# Patient Record
Sex: Female | Born: 1935 | Race: White | Hispanic: No | Smoking: Former smoker
Health system: Southern US, Community
[De-identification: ages and names within clinical notes are randomized; demographics above are authoritative.]

## PROBLEM LIST (undated history)

## (undated) DIAGNOSIS — R011 Cardiac murmur, unspecified: Secondary | ICD-10-CM

## (undated) DIAGNOSIS — E669 Obesity, unspecified: Secondary | ICD-10-CM

## (undated) DIAGNOSIS — G629 Polyneuropathy, unspecified: Secondary | ICD-10-CM

## (undated) DIAGNOSIS — L109 Pemphigus, unspecified: Secondary | ICD-10-CM

## (undated) DIAGNOSIS — E1159 Type 2 diabetes mellitus with other circulatory complications: Secondary | ICD-10-CM

## (undated) DIAGNOSIS — E785 Hyperlipidemia, unspecified: Secondary | ICD-10-CM

## (undated) DIAGNOSIS — K219 Gastro-esophageal reflux disease without esophagitis: Secondary | ICD-10-CM

## (undated) DIAGNOSIS — G4733 Obstructive sleep apnea (adult) (pediatric): Secondary | ICD-10-CM

## (undated) DIAGNOSIS — F419 Anxiety disorder, unspecified: Secondary | ICD-10-CM

## (undated) DIAGNOSIS — I10 Essential (primary) hypertension: Secondary | ICD-10-CM

## (undated) DIAGNOSIS — R569 Unspecified convulsions: Secondary | ICD-10-CM

## (undated) DIAGNOSIS — R51 Headache: Secondary | ICD-10-CM

## (undated) DIAGNOSIS — N390 Urinary tract infection, site not specified: Secondary | ICD-10-CM

## (undated) DIAGNOSIS — N209 Urinary calculus, unspecified: Secondary | ICD-10-CM

## (undated) DIAGNOSIS — F32A Depression, unspecified: Secondary | ICD-10-CM

## (undated) DIAGNOSIS — E1142 Type 2 diabetes mellitus with diabetic polyneuropathy: Secondary | ICD-10-CM

## (undated) DIAGNOSIS — I209 Angina pectoris, unspecified: Secondary | ICD-10-CM

## (undated) DIAGNOSIS — R040 Epistaxis: Secondary | ICD-10-CM

## (undated) DIAGNOSIS — I2510 Atherosclerotic heart disease of native coronary artery without angina pectoris: Secondary | ICD-10-CM

## (undated) DIAGNOSIS — M8580 Other specified disorders of bone density and structure, unspecified site: Secondary | ICD-10-CM

## (undated) DIAGNOSIS — I119 Hypertensive heart disease without heart failure: Secondary | ICD-10-CM

## (undated) DIAGNOSIS — F329 Major depressive disorder, single episode, unspecified: Secondary | ICD-10-CM

## (undated) DIAGNOSIS — M1990 Unspecified osteoarthritis, unspecified site: Secondary | ICD-10-CM

## (undated) DIAGNOSIS — G4730 Sleep apnea, unspecified: Secondary | ICD-10-CM

## (undated) DIAGNOSIS — Z9989 Dependence on other enabling machines and devices: Secondary | ICD-10-CM

## (undated) DIAGNOSIS — S82899A Other fracture of unspecified lower leg, initial encounter for closed fracture: Secondary | ICD-10-CM

## (undated) HISTORY — DX: Anxiety disorder, unspecified: F41.9

## (undated) HISTORY — DX: Depression, unspecified: F32.A

## (undated) HISTORY — DX: Unspecified convulsions: R56.9

## (undated) HISTORY — DX: Obstructive sleep apnea (adult) (pediatric): G47.33

## (undated) HISTORY — DX: Major depressive disorder, single episode, unspecified: F32.9

## (undated) HISTORY — DX: Obesity, unspecified: E66.9

## (undated) HISTORY — PX: CHOLECYSTECTOMY: SHX55

## (undated) HISTORY — DX: Gastro-esophageal reflux disease without esophagitis: K21.9

## (undated) HISTORY — DX: Type 2 diabetes mellitus with other circulatory complications: E11.59

## (undated) HISTORY — DX: Type 2 diabetes mellitus with diabetic polyneuropathy: E11.42

## (undated) HISTORY — DX: Polyneuropathy, unspecified: G62.9

## (undated) HISTORY — PX: CARPAL TUNNEL RELEASE: SHX101

## (undated) HISTORY — DX: Hyperlipidemia, unspecified: E78.5

## (undated) HISTORY — DX: Atherosclerotic heart disease of native coronary artery without angina pectoris: I25.10

## (undated) HISTORY — DX: Other fracture of unspecified lower leg, initial encounter for closed fracture: S82.899A

## (undated) HISTORY — DX: Unspecified osteoarthritis, unspecified site: M19.90

## (undated) HISTORY — DX: Hypertensive heart disease without heart failure: I11.9

## (undated) HISTORY — PX: CARDIAC CATHETERIZATION: SHX172

## (undated) HISTORY — PX: SHOULDER ARTHROSCOPY: SHX128

## (undated) HISTORY — DX: Other specified disorders of bone density and structure, unspecified site: M85.80

## (undated) HISTORY — DX: Obstructive sleep apnea (adult) (pediatric): Z99.89

## (undated) HISTORY — PX: CATARACT EXTRACTION W/PHACO: SHX586

## (undated) HISTORY — PX: EYE SURGERY: SHX253

## (undated) HISTORY — PX: ABDOMINAL HYSTERECTOMY: SHX81

## (undated) HISTORY — DX: Pemphigus, unspecified: L10.9

---

## 1994-01-10 HISTORY — PX: CORONARY ARTERY BYPASS GRAFT: SHX141

## 1994-03-30 DIAGNOSIS — Z951 Presence of aortocoronary bypass graft: Secondary | ICD-10-CM | POA: Insufficient documentation

## 2001-11-20 ENCOUNTER — Encounter: Payer: Self-pay | Admitting: Emergency Medicine

## 2001-11-20 ENCOUNTER — Emergency Department (HOSPITAL_COMMUNITY): Admission: EM | Admit: 2001-11-20 | Discharge: 2001-11-20 | Payer: Self-pay | Admitting: Emergency Medicine

## 2003-05-06 ENCOUNTER — Ambulatory Visit (HOSPITAL_COMMUNITY): Admission: RE | Admit: 2003-05-06 | Discharge: 2003-05-06 | Payer: Self-pay | Admitting: Neurology

## 2004-06-15 ENCOUNTER — Encounter: Admission: RE | Admit: 2004-06-15 | Discharge: 2004-06-15 | Payer: Self-pay | Admitting: Internal Medicine

## 2004-06-30 ENCOUNTER — Encounter: Admission: RE | Admit: 2004-06-30 | Discharge: 2004-06-30 | Payer: Self-pay | Admitting: Internal Medicine

## 2005-05-11 ENCOUNTER — Encounter: Payer: Self-pay | Admitting: Emergency Medicine

## 2005-06-17 ENCOUNTER — Encounter: Admission: RE | Admit: 2005-06-17 | Discharge: 2005-06-17 | Payer: Self-pay | Admitting: Internal Medicine

## 2005-11-14 ENCOUNTER — Encounter: Admission: RE | Admit: 2005-11-14 | Discharge: 2005-11-14 | Payer: Self-pay | Admitting: Internal Medicine

## 2006-11-27 ENCOUNTER — Ambulatory Visit: Payer: Self-pay | Admitting: Internal Medicine

## 2006-11-27 LAB — CONVERTED CEMR LAB
Folate: >20 ng/mL
Transferrin: 234.2 mg/dL (ref >=212.0)
Vitamin B-12: 403 pg/mL (ref 211–911)

## 2007-01-10 ENCOUNTER — Ambulatory Visit: Payer: Self-pay | Admitting: Internal Medicine

## 2007-01-16 ENCOUNTER — Ambulatory Visit: Payer: Self-pay | Admitting: Internal Medicine

## 2007-02-20 ENCOUNTER — Encounter: Admission: RE | Admit: 2007-02-20 | Discharge: 2007-02-20 | Payer: Self-pay | Admitting: Internal Medicine

## 2007-12-25 ENCOUNTER — Inpatient Hospital Stay (HOSPITAL_COMMUNITY): Admission: EM | Admit: 2007-12-25 | Discharge: 2007-12-27 | Payer: Self-pay | Admitting: Emergency Medicine

## 2007-12-25 ENCOUNTER — Ambulatory Visit: Payer: Self-pay | Admitting: Cardiovascular Disease

## 2007-12-26 ENCOUNTER — Encounter (INDEPENDENT_AMBULATORY_CARE_PROVIDER_SITE_OTHER): Payer: Self-pay | Admitting: Neurology

## 2008-03-04 ENCOUNTER — Emergency Department (HOSPITAL_COMMUNITY): Admission: EM | Admit: 2008-03-04 | Discharge: 2008-03-04 | Payer: Self-pay | Admitting: Emergency Medicine

## 2008-03-14 ENCOUNTER — Observation Stay (HOSPITAL_COMMUNITY): Admission: EM | Admit: 2008-03-14 | Discharge: 2008-03-15 | Payer: Self-pay | Admitting: Emergency Medicine

## 2008-10-07 ENCOUNTER — Telehealth: Payer: Self-pay | Admitting: Internal Medicine

## 2008-10-08 ENCOUNTER — Ambulatory Visit: Payer: Self-pay | Admitting: Internal Medicine

## 2008-10-10 ENCOUNTER — Ambulatory Visit (HOSPITAL_COMMUNITY): Admission: RE | Admit: 2008-10-10 | Discharge: 2008-10-10 | Payer: Self-pay | Admitting: Internal Medicine

## 2008-10-16 ENCOUNTER — Ambulatory Visit: Payer: Self-pay | Admitting: Internal Medicine

## 2009-01-26 ENCOUNTER — Encounter: Admission: RE | Admit: 2009-01-26 | Discharge: 2009-01-26 | Payer: Self-pay | Admitting: Internal Medicine

## 2009-07-16 ENCOUNTER — Inpatient Hospital Stay (HOSPITAL_COMMUNITY): Admission: EM | Admit: 2009-07-16 | Discharge: 2009-07-19 | Payer: Self-pay | Admitting: Emergency Medicine

## 2009-09-21 ENCOUNTER — Encounter: Admission: RE | Admit: 2009-09-21 | Discharge: 2009-09-21 | Payer: Self-pay | Admitting: Cardiology

## 2010-01-31 ENCOUNTER — Encounter: Payer: Self-pay | Admitting: Internal Medicine

## 2010-03-03 ENCOUNTER — Other Ambulatory Visit: Payer: Self-pay | Admitting: Internal Medicine

## 2010-03-03 DIAGNOSIS — Z1231 Encounter for screening mammogram for malignant neoplasm of breast: Secondary | ICD-10-CM

## 2010-03-25 ENCOUNTER — Ambulatory Visit
Admission: RE | Admit: 2010-03-25 | Discharge: 2010-03-25 | Disposition: A | Discharge status: Home / Self Care | Payer: Medicare Other | Source: Ambulatory Visit | Attending: Internal Medicine | Admitting: Internal Medicine

## 2010-03-25 DIAGNOSIS — Z1231 Encounter for screening mammogram for malignant neoplasm of breast: Secondary | ICD-10-CM

## 2010-03-28 LAB — URINE MICROSCOPIC-ADD ON

## 2010-03-28 LAB — URINALYSIS, ROUTINE W REFLEX MICROSCOPIC
Glucose, UA: NEGATIVE mg/dL
Hgb urine dipstick: NEGATIVE
Protein, ur: NEGATIVE mg/dL
Specific Gravity, Urine: 1.025 (ref 1.005–1.030)
Urobilinogen, UA: 1 mg/dL (ref 0.0–1.0)
pH: 5.5 (ref 5.0–8.0)

## 2010-03-28 LAB — CBC
HCT: 36.3 % (ref 36.0–46.0)
Hemoglobin: 11.7 g/dL — ABNORMAL LOW (ref 12.0–15.0)
Hemoglobin: 12.7 g/dL (ref 12.0–15.0)
Hemoglobin: 12.9 g/dL (ref 12.0–15.0)
MCH: 28.5 pg (ref 26.0–34.0)
MCH: 28.8 pg (ref 26.0–34.0)
MCH: 29.4 pg (ref 26.0–34.0)
MCHC: 33.7 g/dL (ref 30.0–36.0)
MCHC: 34.9 g/dL (ref 30.0–36.0)
MCV: 85.5 fL (ref 78.0–100.0)
Platelets: 255 10*3/uL (ref 150–400)
Platelets: 282 10*3/uL (ref 150–400)
RBC: 4.11 MIL/uL (ref 3.87–5.11)
RBC: 4.3 MIL/uL (ref 3.87–5.11)
RBC: 4.47 MIL/uL (ref 3.87–5.11)
RDW: 14.8 % (ref 11.5–15.5)
WBC: 5.6 10*3/uL (ref 4.0–10.5)

## 2010-03-28 LAB — BASIC METABOLIC PANEL
CO2: 25 mEq/L (ref 19–32)
CO2: 29 mEq/L (ref 19–32)
Calcium: 8.4 mg/dL (ref 8.4–10.5)
Calcium: 8.8 mg/dL (ref 8.4–10.5)
GFR calc Af Amer: >60 mL/min (ref >60)
GFR calc Af Amer: >60 mL/min (ref >60)
GFR calc non Af Amer: >60 mL/min (ref >60)
GFR calc non Af Amer: >60 mL/min (ref >60)
Glucose, Bld: 164 mg/dL — ABNORMAL HIGH (ref 70–99)
Potassium: 4.4 mEq/L (ref 3.5–5.1)
Sodium: 140 mEq/L (ref 135–145)
Sodium: 141 mEq/L (ref 135–145)

## 2010-03-28 LAB — CULTURE, BLOOD (ROUTINE X 2)
Culture: NO GROWTH
Culture: NO GROWTH

## 2010-03-28 LAB — POCT I-STAT, CHEM 8
BUN: 14 mg/dL (ref 6–23)
Calcium, Ion: 1 mmol/L — ABNORMAL LOW (ref 1.12–1.32)
Chloride: 104 mEq/L (ref 96–112)
Creatinine, Ser: 0.8 mg/dL (ref 0.4–1.2)
Hemoglobin: 13.6 g/dL (ref 12.0–15.0)
Potassium: 4.3 mEq/L (ref 3.5–5.1)
Sodium: 137 mEq/L (ref 135–145)

## 2010-03-28 LAB — GLUCOSE, CAPILLARY
Glucose-Capillary: 138 mg/dL — ABNORMAL HIGH (ref 70–99)
Glucose-Capillary: 140 mg/dL — ABNORMAL HIGH (ref 70–99)
Glucose-Capillary: 151 mg/dL — ABNORMAL HIGH (ref 70–99)
Glucose-Capillary: 160 mg/dL — ABNORMAL HIGH (ref 70–99)

## 2010-03-28 LAB — HEMOGLOBIN A1C
Hgb A1c MFr Bld: 7.2 % — ABNORMAL HIGH (ref <5.7)
Mean Plasma Glucose: 160 mg/dL — ABNORMAL HIGH (ref <117)

## 2010-03-28 LAB — URINE CULTURE: Colony Count: 65000

## 2010-03-28 LAB — DIFFERENTIAL
Basophils Absolute: 0.1 10*3/uL (ref 0.0–0.1)
Neutro Abs: 6.6 10*3/uL (ref 1.7–7.7)

## 2010-03-28 LAB — CORTISOL-AM, BLOOD: Cortisol - AM: 5.3 ug/dL (ref 4.3–22.4)

## 2010-03-28 LAB — TSH: TSH: 1.523 u[IU]/mL (ref 0.350–4.500)

## 2010-03-28 LAB — POCT CARDIAC MARKERS: Myoglobin, poc: 52.7 ng/mL (ref 12–200)

## 2010-03-28 LAB — VITAMIN B12: Vitamin B-12: 283 pg/mL (ref 211–911)

## 2010-04-15 LAB — GLUCOSE, CAPILLARY
Glucose-Capillary: 124 mg/dL — ABNORMAL HIGH (ref 70–99)
Glucose-Capillary: 130 mg/dL — ABNORMAL HIGH (ref 70–99)

## 2010-04-22 LAB — COMPREHENSIVE METABOLIC PANEL
BUN: 16 mg/dL (ref 6–23)
CO2: 28 mEq/L (ref 19–32)
CO2: 28 mEq/L (ref 19–32)
Calcium: 8.4 mg/dL (ref 8.4–10.5)
Calcium: 8.9 mg/dL (ref 8.4–10.5)
Chloride: 105 mEq/L (ref 96–112)
Chloride: 108 mEq/L (ref 96–112)
Creatinine, Ser: 0.76 mg/dL (ref 0.4–1.2)
Creatinine, Ser: 1.05 mg/dL (ref 0.4–1.2)
GFR calc Af Amer: >60 mL/min (ref >60)
GFR calc non Af Amer: 51 mL/min — ABNORMAL LOW (ref >60)
GFR calc non Af Amer: >60 mL/min (ref >60)
Glucose, Bld: 120 mg/dL — ABNORMAL HIGH (ref 70–99)
Glucose, Bld: 129 mg/dL — ABNORMAL HIGH (ref 70–99)
Total Bilirubin: 0.5 mg/dL (ref 0.3–1.2)
Total Bilirubin: 0.6 mg/dL (ref 0.3–1.2)

## 2010-04-22 LAB — URINALYSIS, ROUTINE W REFLEX MICROSCOPIC
Glucose, UA: NEGATIVE mg/dL
Ketones, ur: NEGATIVE mg/dL
Nitrite: NEGATIVE
Protein, ur: NEGATIVE mg/dL
pH: 5.5 (ref 5.0–8.0)

## 2010-04-22 LAB — CBC
HCT: 33.2 % — ABNORMAL LOW (ref 36.0–46.0)
HCT: 36 % (ref 36.0–46.0)
Hemoglobin: 11.9 g/dL — ABNORMAL LOW (ref 12.0–15.0)
MCHC: 32.7 g/dL (ref 30.0–36.0)
MCHC: 33 g/dL (ref 30.0–36.0)
MCV: 88.4 fL (ref 78.0–100.0)
MCV: 88.5 fL (ref 78.0–100.0)
RBC: 3.76 MIL/uL — ABNORMAL LOW (ref 3.87–5.11)
RBC: 4.07 MIL/uL (ref 3.87–5.11)
WBC: 6 10*3/uL (ref 4.0–10.5)

## 2010-04-22 LAB — GLUCOSE, CAPILLARY: Glucose-Capillary: 113 mg/dL — ABNORMAL HIGH (ref 70–99)

## 2010-04-22 LAB — DIFFERENTIAL
Basophils Absolute: 0 10*3/uL (ref 0.0–0.1)
Eosinophils Absolute: 0.6 10*3/uL (ref 0.0–0.7)
Lymphocytes Relative: 33 % (ref 12–46)
Lymphs Abs: 2.8 10*3/uL (ref 0.7–4.0)
Neutrophils Relative %: 50 % (ref 43–77)

## 2010-04-22 LAB — POCT CARDIAC MARKERS: Myoglobin, poc: 60.2 ng/mL (ref 12–200)

## 2010-04-22 LAB — URINE MICROSCOPIC-ADD ON

## 2010-04-27 LAB — COMPREHENSIVE METABOLIC PANEL
AST: 25 U/L (ref 0–37)
Albumin: 3 g/dL — ABNORMAL LOW (ref 3.5–5.2)
Alkaline Phosphatase: 104 U/L (ref 39–117)
BUN: 14 mg/dL (ref 6–23)
Chloride: 105 mEq/L (ref 96–112)
GFR calc Af Amer: >60 mL/min (ref >60)
Potassium: 4 mEq/L (ref 3.5–5.1)
Total Protein: 6.5 g/dL (ref 6.0–8.3)

## 2010-04-27 LAB — URINE MICROSCOPIC-ADD ON

## 2010-04-27 LAB — DIFFERENTIAL
Basophils Relative: 1 % (ref 0–1)
Eosinophils Relative: 8 % — ABNORMAL HIGH (ref 0–5)
Lymphocytes Relative: 31 % (ref 12–46)
Monocytes Absolute: 0.5 10*3/uL (ref 0.1–1.0)
Monocytes Relative: 7 % (ref 3–12)
Neutro Abs: 3.6 10*3/uL (ref 1.7–7.7)

## 2010-04-27 LAB — URINALYSIS, ROUTINE W REFLEX MICROSCOPIC
Glucose, UA: NEGATIVE mg/dL
Nitrite: NEGATIVE
Specific Gravity, Urine: 1.022 (ref 1.005–1.030)
pH: 6 (ref 5.0–8.0)

## 2010-04-27 LAB — APTT: aPTT: 26 seconds (ref 24–37)

## 2010-04-27 LAB — CBC
HCT: 38.1 % (ref 36.0–46.0)
Platelets: 221 10*3/uL (ref 150–400)
RDW: 15.4 % (ref 11.5–15.5)
WBC: 6.7 10*3/uL (ref 4.0–10.5)

## 2010-05-25 NOTE — Consult Note (Signed)
NAME:  Linda, Oliver                  ACCOUNT NO.:  [id number]   MEDICAL RECORD NO.:  [id number]          PATIENT TYPE:  OBV   LOCATION:  [place]                         FACILITY:  [facility]   PHYSICIAN:  Pramod P. Leonie Man, MD    DATE OF BIRTH:  1932-07-23   DATE OF CONSULTATION:  03/14/2008  DATE OF DISCHARGE:                                 CONSULTATION   NEUROLOGICAL CONSULTATION   REASON FOR CONSULTATION:  Episode of confusion, transient ischemic  attack versus seizure.   HISTORY OF PRESENT ILLNESS:  Mrs. Linda Oliver is a 77 year old pleasant  Caucasian lady who had a brief episode of confusion today.  History is  provided by the patient's granddaughter who was with her during this  episode.  The patient went around 9:30 for breakfast at a restaurant and  she did not feel well, felt light headed and near syncopal.  She was  helped by the restaurant staff and she sat in a chair and felt alright  after a few minutes.  She was able to walk and go in the car and a half  hour later when she reached the physical therapy place, the  granddaughter noticed that she was not following commands and seemed  confused and was disoriented.  She could be redirected but at times was  found to be just staring with a blank look on her face.  She had little  movement of her lips.  There was no hand twirling or other movements.  She appeared disoriented and confused and did not know where she was and  what she was doing.  This lasted for about an hour or so and then  subsequently her confusion cleared but she felt very tired and slept  most of the afternoon.  She has had two similar episodes in the last few  months, one on December 15th when she was hospitalized and at that time  work up included an MRI scan of the brain which should only changes of  small vessel disease.  EEG did not show definitive seizure activity but  the patient had another episode on February 11th.  She was thought to  have possible complex  partial seizures and was seen by Dr. Brett Fairy and  started on Keppra 250 twice a day.  The patient, however, started having  excessive sleepiness and could not afford the brand name Keppra and  hence Dr. Virgina Jock discontinued the [place] and patient did fine until  today.  She has no known history of major head injuries, loss of  consciousness, definite strokes or TIAs.   PAST MEDICAL HISTORY:  1. Diabetes.  2. Hypertension.  3. Hyperlipidemia.  4. MI.   HOME MEDICATIONS:  1. Aspirin.  2. Actos.  3. Cozaar.  4. Cymbalta.  5. Neurontin.  6. Premarin.  7. Vytorin.  8. Metoprolol.  9. Elavil.   REVIEW OF SYSTEMS:  Negative for any fever, cough, chest pain, diarrhea  or other illness.   PHYSICAL EXAMINATION:  GENERAL:  The physical examination reveals an  obese, elderly Caucasian lady who is currently not in distress.  VITAL SIGNS:  She is afebrile with pulse rate of 59 per minute, regular.  Blood pressure is 106/39.  Respiratory rate 18 per minute.  Distal  pulses are well felt.  HEENT:  Head is nontraumatic. ENT exam is unremarkable.  NECK:  Supple.  There is no bruit.  CARDIAC:  No murmur, gallop.  LUNGS:  Clear to auscultation.  ABDOMEN:  Soft, nontender.  NEUROLOGICAL:  The patient is presently awake, alert and cooperative.  There is no facial apraxia or dysarthria and movements are full range.  Face is symmetric.  Parietal movements are normal.  Tongue is motor.  Motor system exam reveals no upper extremity drift, symmetric strength,  tone, reflexes, coordination, sensation.   CLINICAL DATA:  A head CT scan on admission shows no acute  abnormalities.  MRI scan of the brain from December 25, 2007 shows mild  changes of small vessel disease without acute abnormality.  Capillary  blood glucose on this admission was 106.  Electrolytes are normal.  UA  shows 3-6 WBCs.  White count is normal.  EEG done from the previous  admission showed no epileptiform activity.    IMPRESSION:  A 77 year old lady with recurrent episodes of transient  confusion, disorientation and a blank look on her face, possible complex  partial seizures.  Posterior circulation transient ischemic attack also  possible though less likely, given her negative work up in the past.   PLAN:  I would recommend a trial of anticonvulsants.  Since the patient  has not been able to tolerate Keppra and cannot afford brand name  medications, I would recommend instead trying Dilantin 300 mg at night.  I have discussed possible side effects including balance worsening with  the patient and family members and advised her to follow up with Dr.  Virgina Jock for checking her Dilantin levels.  I have advised her not to drive  for the next 3 to 6 months as mandated by [employer].  If she is  unable to tolerate Dilantin may consider switching to lamotrigine in the  future.  The patient had an electroencephalogram done today, the results  of which are pending.  She needs to follow up with Dr. Brett Fairy as an  outpatient in her office in 3 to 4 weeks.   Thank you for the referral.           ______________________________  Kathie Rhodes. Leonie Man, MD     PPS/MEDQ  D:  03/14/2008  T:  03/15/2008  Job:  [id number]

## 2010-05-25 NOTE — Procedures (Signed)
HISTORY:  This is a 77 year old patient who is being admitted on  December 25, 2007, for a stroke with the change in behavior, vertigo,  altered speech, diabetes, hypertension, and coronary artery disease.  This is a routine EEG.  No skull defects were noted.   MEDICATIONS:  Aspirin, amitriptyline, Actos, Neurontin, Protonix,  Januvia, Cozaar, metoprolol, Cymbalta, Reglan, Tylenol, and Ativan.   EEG CLASSIFICATION:  Dysrhythmia, grade 1 of left temporal.   DESCRIPTION OF THE RECORDING:  The background rhythm of this recording  consists of a fairly well-modulated medium amplitude 9 Hz alpha rhythm,  that is reactive to eye opening and closure.  As the record progresses,  the patient appears to be in a waking state during the recording.  Photic stimulation is performed resulting in an excellent and  bilaterally symmetric photic driving response.  Hyperventilation was not  performed.  Intermittently during the recording, a subtle theta slowing  is seen emanating from the left mid temporal region occasionally  associated with what appear to be sharp transients.  This has not seen  on the right side.  At no time during the recording did there appear to  be evidence of actual spike or spike wave discharges.  EKG monitor shows  no evidence of cardiac rhythm abnormalities with a heart rate of 56.   IMPRESSION:  This is an abnormal EEG recording due to mild left temporal  slowing.  Such recording suggests focal injury to the left brain and it  could be related to cerebrovascular disease.  Occasionally, sharp  transients were seen and may suggest a lowered seizure threshold.  Clinical correlation is required if the events described could be  associated with true seizure events.      Jill Alexanders, M.D.  Electronically Signed     [id number]  D:  12/26/2007 15:21:04  T:  12/27/2007 03:25:52  Job #:  [id number]

## 2010-05-25 NOTE — Assessment & Plan Note (Signed)
[facility]                         GASTROENTEROLOGY OFFICE NOTE   Linda Oliver, Linda Oliver                         MRN:          [id number]  DATE:11/27/2006                            DOB:          October 26, 1932    REFERRING PHYSICIAN:  Precious Reel, MD   REASON FOR CONSULTATION:  Iron deficiency anemia.   DISTANT HISTORY:  This is 77 year old female with history of  hypertension, coronary artery disease, diabetes, and dyslipidemia.  She  is referred through the courtesy of Dr. Virgina Jock regarding possible iron  deficiency anemia.  Routine blood work obtained November 01, 2006  revealed mild anemia with a hemoglobin of 11.3.  She was prescribed iron  325 mg daily.  The patient tells me that she has a prior history of  anemia. She submitted heme-occult cards last year which were reportedly  negative.  No heme-occult cards this year.  She denies abdominal pain,  change in bowel habits, melena or hematochezia.  Her weight has been  stable.  She does have chronic heartburn for which she takes omeprazole.  Omeprazole works well.  No dysphagia.  Her only endoscopic evaluation  was a colonoscopy about 40 years ago in [place].   PAST MEDICAL HISTORY:  1. Hypertension.  2. Coronary artery disease status post coronary artery bypass      grafting.  3. Type 2 diabetes mellitus.  4. Dyslipidemia.  5. Status post cholecystectomy.  6. Status post hysterectomy with bilateral salpingo-oophorectomy and      incidental appendectomy.   FAMILY HISTORY:  Negative for gastrointestinal malignancy.   ALLERGIES:  PERCOCET WHICH RESULTS IN NAUSEA AND VOMITING.   CURRENT MEDICATIONS:  1. Cozaar 50 mg daily.  2. Actos 30 mg daily.  3. Amitriptyline 50 mg daily.  4. Metoprolol 50 mg b.i.d.  5. Premarin 0.3 mg daily.  6. Cymbalta 30 mg t.i.d.  7. Gabapentin 800 mg t.i.d.  8. Vytorin 10/20 mg at night.  9. Omeprazole 20 mg b.i.d.  10.Januvia 100 mg daily.  11.Aspirin 81  mg daily.   SOCIAL HISTORY:  The patient is married with one daughter.  She lives  with her husband.  She has a background in Engineer, mining and attended college  for 2 years.  She is retired.  She does not smoke or use alcohol.   REVIEW OF SYSTEMS:  Per diagnostic evaluation form.   PHYSICAL EXAMINATION:  GENERAL:  Pleasant, elderly female in no acute  distress.  She is alert and oriented.  VITAL SIGNS:  Her blood pressure is 110/58, heart rate is 78 and  regular.  Weight is 196 pounds.  She is 5 feet in height.  HEENT:  Sclerae anicteric.  Conjunctivae are pink.  Oral mucosa intact.  No adenopathy.  LUNGS:  Clear.  HEART:  Regular.  ABDOMEN:  Soft without tenderness, mass or hernia.  Good bowel sounds  heard.  EXTREMITIES:  Without edema.   IMPRESSION:  1. Normocytic anemia.  2. Chronic gastroesophageal reflux disease.  3. Colon cancer screening.  Baseline risks without contraindication.  4. Multiple medical problems including coronary artery disease  and      diabetes.   RECOMMENDATIONS:  1. Laboratories today including B-12, folate, and iron studies.  2. Schedule colonoscopy and upper endoscopy to evaluate anemia, as      well as provide colorectal neoplasia screening, as well as      Barrett's screening.  The nature of both procedures, as well as      risks, benefits and alternatives have been reviewed in detail.  She      understood and agreed to proceed.  We discussed several prep      options, and the patient tells me that she can not take fluid-based      preparations.  She wants the osmo-prep.  We did discuss its renal      considerations the importance of hydration.     Docia Chuck. Henrene Pastor, MD  Electronically Signed    JNP/MedQ  DD: 11/27/2006  DT: 11/28/2006  Job #: [id number]   cc:   Precious Reel, MD

## 2010-05-25 NOTE — H&P (Signed)
NAME:  Linda Oliver, Linda Oliver NO.:  [id number]   MEDICAL RECORD NO.:  [id number]          PATIENT TYPE:  INP   LOCATION:  3706                         FACILITY:  [facility]   PHYSICIAN:  Larey Seat, M.D.  DATE OF BIRTH:  Sep 14, 1932   DATE OF ADMISSION:  12/25/2007  DATE OF DISCHARGE:                              HISTORY & PHYSICAL   Linda Oliver is seen on December 25, 2007, as a patient in the emergency  room who presented initially as a code stroke.     She is followed by Dr. Virgina Jock in the outpatient setting.  She is a 77-  year-old female Caucasian.  The patient presented as a code stroke  because of acute changes in her ability to provide fine motor skills,  dexterity, and she developed also slurring of speech.  Apparently, she  had been in a restaurant with her daughter or granddaughter and  complained suddenly of something equivalent of a vertigo spell, then  spoke with slurred speech, and formed new words, spoke nonsensical and  perseverated.  She also dribbled food all over her.  The patient's  granddaughter noticed a sudden change in behavior and contacted Dr.  Virgina Jock who then recommended that the patient to come to the ER.  She arrived in private car, I understand.  Her left arm was still described as tingling and feeling heavy.  The patient remained confused, but she did not have significant motor  deficits at the time of arrival here.   PAST MEDICAL HISTORY:  The patient has hypertension, diabetes, coronary  artery disease, depression, chronic pain, hypercholesterolemia.  She has  a history of CABG and cholecystectomy.  Stroke risk factors include, of  course, hypertension, diabetes mellitus, coronary artery disease, a  prior stroke is not known.   The patient's medications with doses are as followed.  These were  discussed with Dr. Virgina Jock who gave me the details over the phone today.  The patient has no listed allergies.  She is on:  1. Omeprazole 20 mg in  p.m.  2. Januvia 100 mg daily by mouth.  3. Cozaar 100 mg in the morning, 50 mg in the afternoon or evening by      mouth.  4. Actos 30 mg in the morning by mouth.  5. Vytorin 10/20 mg in the evening p.o.  6. Cymbalta 60 mg daily by mouth.  7. Elavil 50 mg 2 in the morning, 1 at night.  8. Neurontin 800 mg t.i.d. 3 times daily, approximately at 7 o'clock,      14 hours, and 20 hours.  9. Metoprolol 50 mg at night.   The patient also was on Premarin and Prempro which have been put on  hold.  She was already on a baby aspirin at home, I have increased this  to 325 mg aspirin here.   FAMILY HISTORY:  Negative for stroke.   SOCIAL HISTORY:  The patient is a nonsmoker and nondrinker.  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   REVIEW OF SYSTEMS:  The patient herself is too confused to answer  questions, shows a rather unusual behavior with perseveration and word  replacements and acts somewhat encephalopathic.  However, the acute  onset of these changes warranted the evaluation for possible stroke or  seizure.   PHYSICAL EXAMINATION:  VITAL SIGNS:  Temperature of 97 degrees  Fahrenheit, a blood pressure of 127/55, heart rate of 60 and regular,  respiratory rate was 14, O2 saturation was 97.  The patient had received a fluid bolus as she presented with an 85  systolic blood pressure initially.  This may also give a hint that the  patient may have doubled accidentally on medications such as blood  pressure medicines and Elavil which could further cause encephalopathic  changes.  HEENT:  The patient has a Mallampati grade C.  NECK:  No goiter.  No lymph node swelling.  LUNGS:  Clear to auscultation.  ABDOMEN:  Soft and nontender abdomen.  HEART:  She has no bruits.  No cardiac murmur.  SKIN:  She has slightly decreased skin turgor.  MENTAL STATUS:  The patient continues to  perseverate, at times has  slurring of speech, then  seems to clear the dysarthria component again.  She has replaced words with similar words, such as apple with oranges.  She names some objects over-and-over, such as door, door or mirror,  mirror.  She points with her index finger at this object while she  repeats its name.   NUTRITIONAL STATUS:  The patient is well fed.  NIH stroke scale is 0.  She has no remaining continued deficit and modified Rankin score is 0  according to the family.   Test results for this CT were negative.  Test results for the MRI were  negative for acute stroke too, but I am concerned about a very large  Sylvian fissure that seems to be right side, asymmetrically larger than  on the left.   White blood cell count was 6.1, hemoglobin/hematocrit 11.1/33.5.  Sodium  138, potassium 3.6, chloride 105, carbon dioxide 26, creatinine 0.64,  BUN 11, glucose 106, calcium 8.6.  There were no significant abnormalities that would be helping to explain  her mental status changes.  EKG shows sinus bradycardia.  Heart rate is  between 50 and 60 bpm .  THE  code stroke was called at 1410.  The patient was evaluated to 1440.   ASSESSMENT:  I believe that the patient may have encephalopathic changes  due to an accidental overdose of her medicines versus a possible  seizure.  An EEG is ordered for tomorrow.  I have ordered a metabolic panel, and I have discussed with Dr. Virgina Jock  her current medication regimen.  I have held Premarin, increased the  aspirin, and he will see her tomorrow on his rounds.   Admission to 3000 telemetry under Dr. Dohmeier/ StrokeMD      Larey Seat, M.D.  Electronically Signed     CD/MEDQ  D:  12/25/2007  T:  12/26/2007  Job:  [id number]

## 2010-05-25 NOTE — Procedures (Signed)
EEG NUMBER:  [id number].   CLINICAL HISTORY:  A 77 year old lady being evaluated for episode of  confusion, possible complex partial seizure.  Medications listed:  Premarin, Neurontin, amitriptyline, Aciphex, aspirin, Cozaar,  metoprolol, Vytorin, Cymbalta, and Januvia.   This is a portable EEG recorded with the patient asleep, using a 17-  channel machine and standard 10/20 electrode placement with one lead  dedicated to EKG recording.   This record began with the patient asleep with background of 6-7 Hz  theta which is rhythmic and symmetric.  Intermittent 4-5 Hz slowing is  seen bilaterally in diffuse distribution.  No definite awake portions on  the recording are noted.  No paroxysmal epileptiform activities, spikes  or sharp waves are seen.  Length of the recording is 22.2 minutes.  Technical component is average.  EKG tracing reveals regular sinus  rhythm.  Hyperventilation and photic stimulation were not performed,  this being a portable study.   IMPRESSION:  This EEG performed during the sleep state is within normal  limits, no definite epileptiform features were noted.           ______________________________  Kathie Rhodes. Leonie Man, MD     [id number]  D:  03/14/2008 19:12:47  T:  03/15/2008 05:23:15  Job #:  [id number]   cc:   Alfonzo Beers, MD

## 2010-05-25 NOTE — Discharge Summary (Signed)
NAME:  Linda Oliver, Linda Oliver                  ACCOUNT NO.:  [id number]   MEDICAL RECORD NO.:  [id number]          PATIENT TYPE:  INP   LOCATION:  3706                         FACILITY:  [facility]   PHYSICIAN:  Pramod P. Leonie Man, MD    DATE OF BIRTH:  [birth year]   DATE OF ADMISSION:  12/25/2007  DATE OF DISCHARGE:  12/27/2007                               DISCHARGE SUMMARY   ADMISSION DIAGNOSIS:  Code stroke.   DISCHARGE DIAGNOSES:  1. Recurrent spells of confusion.  2. 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   HOSPITAL COURSE:  [name] is a 77 year old pleasant Caucasian lady,  who was brought to [facility] Emergency Room as a code stroke for sudden  onset of altered mental status, which included some slurred speech,  nonsensical speech, and verbal perseveration.  She had also dribbled  food on her when  having a meal.  She had complained of some mild  dizziness prior to this episode.  There is no focal extremity weakness  noted.  Initially, when she came in, Dr. Brett Fairy saw her and thought  this episode was probably a confusion spell and not a seizure or a  stroke.  She did undergo an MRI scan of the brain on admission, which  was negative for acute stroke.  Subsequently, when I spoke to the  patient's daughter, I got history that she had therefore such similar  episodes in the last 6 months and they lasted for an hour or so.  EEG  was obtained in the hospital stay, which did not show definite seizure  activity.  The patient was started on Keppra for presumed complex  partial seizures.  Carotid ultrasound showed no significant stenosis.  A  2-D echo showed normal ejection fraction.  At the time of discharge, EEG  had shown some slowing in the temporal lobe, but no definite seizure  activity.  A 2-D echo showed ejection fraction of 60% without cardiac  source of embolism.  The patient was discharged home and advised to  follow up with Dr. Brett Fairy as an outpatient for seizures and to see Dr.  Wynonia Lawman, her cardiologist for outpatient [place] monitoring.   At the time of discharge medications were:  1. Keppra 250 twice a day.  2. Omeprazole 20 mg a day.  3. Januvia 100 mg a day.  4. Cozaar 100 mg in the morning, 50 at night.  5. Actos 30 mg in the morning.  6. Vytorin 10/20 once a day.  7. Cymbalta 60 mg a day.  8. Elavil 100 mg in the morning and 50 at night.  9. Neurontin 800 three times a day.  10.Metoprolol 50 at bedtime.  11.Lorazepam 2 mg as needed at bedtime.   The patient was advised to follow up with Dr. Wynonia Lawman, her cardiologist  and Dr. Shon Baton, her primary care physician as needed.           ______________________________  Kathie Rhodes. Leonie Man, MD     PPS/MEDQ  D:  02/21/2008  T:  02/21/2008  Job:  [id number]

## 2010-05-25 NOTE — H&P (Signed)
NAME:  Linda Oliver, TEMPLE NO.:  [id number]   MEDICAL RECORD NO.:  [id number]          PATIENT TYPE:  OBV   LOCATION:  [place]                         FACILITY:  [facility]   PHYSICIAN:  Precious Reel, MD       DATE OF BIRTH:  [birth year]   DATE OF ADMISSION:  03/14/2008  DATE OF DISCHARGE:                              HISTORY & PHYSICAL   PRIMARY CARE Priseis Cratty:  Precious Reel, MD.   NEUROLOGIST:  Dr. Leonie Man.   CHIEF COMPLAINT:  Altered mental state.   HISTORY OF PRESENT ILLNESS:  This a 77 year old unfortunate female with  multiple medical problems recently having episodic mental issues over  the last few months.  There was a question whether she was having TIAs,  versus strokes, versus encephalopathy issues, versus seizure.  She was  seen by Neuro and started on Keppra in December.  Unfortunately, the  Keppra caused difficulty thinking, increasing confusion and other  symptoms like she was drunk all the time.  We went ahead and stopped it.  I had hoped that her high-dose Neurontin would hold her if in fact was  having seizures.  She was seen on March 04, 2008 in the emergency  room with confusion and altered mental state.  She was sent to the ED.  Labs were negative.  Urinalysis was positive.  Cranial CT was negative.  She was sent home and treated with Bactrim.  We will call today with the  lowest blood pressure, presyncopal type-feelings, altered mental state,  fatigue, sedate.  I sent her to the ER.  Workup was negative.  She will  need admission for evaluation and treatment.  Neurology was consulted  and their assessment is that her underlying symptoms are very compatible  with seizure.  She was started on Dilantin.  She was also given Rocephin  for what could only be a few white cells per high-power field in her  urine compatible with possible early UTI which I do not feel at this  point is accurate.  I will go ahead and stop it.  Other issues that the  family has  noted is decreased concentration, thick speech, garbled  speech, headaches, trouble with gait, decreased memory and some word-  finding issues.  She denies any chest pain, shortness of breath or any  other related issue.   PAST MEDICAL HISTORY:  1. Coronary artery disease status post three-vessel coronary artery      bypass grafting in 1996.  2. Type 2 diabetes.  3. Hyperlipidemia.  4. GERD.  5. Hypertension.  6. Peripheral neuropathy.  7. B12 deficiency.  8. Left lower lobe pneumonia November 2009.  9. Urinary tract infections.  10.Laparoscopic cholecystectomy.  11.Total abdominal hysterectomy and bilateral salpingo-oophorectomy.  12.Appendectomy.  13.Bilateral cataract surgery.  14.Broken right ankle.  15.Osteopenia.  16.Osteoarthritis.  17.Presumed seizures.   ALLERGIES:  1. PERCODAN.  2. KEPPRA.  3. ADHESIVE TAPE.   MEDICATIONS:  1. Neurontin 800 t.i.d.  2. Elavil 50 h.s.  3. Aspirin 81 daily.  4. Cozaar 50 mg 2 tablets in the morning  and 1 tablet in the      afternoon.  5. Toprol 25-50 mg h.s.  6. Vytorin 10/20 h.s.  7. Stool softener p.r.n.  8. Cymbalta 90 daily.  9. Actos 30 daily.  10.Omeprazole 20 b.i.d.  11.Januvia 100 daily.  12.She is off her Keppra   SOCIAL HISTORY:  She is married to [place].  She has 1 child, 2  grandchildren.  She is retired.  She quit smoking in 1991.   FAMILY HISTORY:  Coronary disease and lymphoma.   REVIEW OF SYSTEMS:  She denies any eye, ear, nose or throat symptoms.  She denies chest pain or shortness of breath.  The rest of her today was  reported by her family.  She has not had significant sleep followed by  loss of memory which correlates with underlying seizure.  She has  continued gait disorder.  She was going to have physical therapy today  and that was the event planned.  She is nonfocal right now.  All other  organ systems reviewed and negative.   PHYSICAL EXAMINATION:  VITAL SIGNS:  Blood pressure anywhere  between  93/58 and 106/39, heart rate 57, respiratory rate 18, satting 95% room  air.  GENERAL:  She has got a nonfocal exam, mild confusion.  PULMONARY:  Clear to auscultation bilaterally.  CARDIAC:  Regular with sternotomy noted.  HEENT:  Oropharynx is clear.  ABDOMEN:  Soft.  EXTREMITIES:  No edema.  NEUROLOGIC:  Moving all 4s.  Nonfocal neuro exam.   LABORATORY DATA:  CK and troponin-I are negative, white count 8.4,  hemoglobin 11.9, platelet count 247, sodium 137, potassium 4.5, chloride  105, bicarb 28, BUN 27, creatinine 1.05, glucose 120.  LFTs within  normal limits.  Urinalysis negative.   DIAGNOSTICS:  1. Chest x-ray stable cardiomyopathy with nothing acute, no congestive      heart failure.  2. Cranial CT shows no acute intracranial abnormalities.   ASSESSMENT:  This is an elderly female with multiple medical problems  being admitted with altered mental state compatible with seizure status  post EEG.  Neurology has already seen the patient and is going to start  the patient on Dilantin   PLAN:  1. Admit for 23-hour observation.  2. Agree with staying off [place]  3. We will decrease the Neurontin and Elavil dosing.  4. We will start Dilantin per Neurology.  5. Watch overnight and get blood pressure up.  If looking good, then      we can potentially discharge her on lower doses of the medication.  6. Her hypertension is currently too well controlled as stated above.      I wonder if there is some hyperperfusion related incident which is      leading to the irritation and the global encephalopathic state that      leads to the underlying seizures  7. We will keep her on home medications.  Some medications have been      adjusted.  8. We will control diabetes.  Follow up CBG.  9. DVT prophylaxis has been ordered with Lovenox.  10.If all is well, she will be going home tomorrow morning and I will      follow her up as an outpatient relatively quick.  She will follow       up with Dr. Brett Fairy in 2 weeks' time and will need a Dilantin      level at that moment.      Precious Reel, MD  Electronically Signed     JMR/MEDQ  D:  03/14/2008  T:  03/15/2008  Job:  [id number]

## 2010-05-28 NOTE — Discharge Summary (Signed)
NAME:  Linda Oliver, Linda Oliver NO.:  [id number]   MEDICAL RECORD NO.:  [id number]          PATIENT TYPE:  OBV   LOCATION:  [place]                         FACILITY:  [facility]   PHYSICIAN:  Precious Reel, MD       DATE OF BIRTH:  May 03, 1932   DATE OF ADMISSION:  03/14/2008  DATE OF DISCHARGE:  03/15/2008                               DISCHARGE SUMMARY   DISCHARGE DIAGNOSES:  1. Altered mental state.  2. Presumed seizure activity.  3. Presumed postictal state.  4. Coronary artery disease with coronary artery bypass grafting, three-      vessel, in 1996.  5. Diabetes mellitus type 2.  6. Hyperlipidemia.  7. GERD.  8. Hypertension.  9. Peripheral neuropathy.  10.B12 deficiency.  11.History of left upper lobe pneumonia in September 2009.  12.Urinary tract infections.  13.Cholecystectomy.  14.Total abdominal hysterectomy, bilateral salpingo-oophorectomy in      1972 along with appendectomy.  15.History of bilateral cataract.  16.History of right broken ankle  17.Osteopenia.  18.Osteoarthritis.  19.Prior presumption of seizure.   ALLERGIES:  PERCODAN, KEPPRA, ADHESIVE TAPE.   MEDICATIONS:  1. Januvia 100 mg p.o. daily.  2. Nasonex as directed.  3. Omeprazole 20 mg p.o. b.i.d.  4. Actos 30 mg p.o. daily.  5. Cymbalta 50 mg daily.  6. Stool softener as directed.  7. Vytorin 10/20 at bedtime.  8. Metoprolol 50 mg at bedtime.  9. Aspirin 81 mg daily.  10.Cozaar reduced at 25 mg p.o. daily.  11.Neurontin 600 mg p.o. t.i.d.  12.Amitriptyline 25 mg p.o. at bedtime.  13.Dilantin 100 mg 3 tablets at night.   PROCEDURES:  EEG and cranial CT.   HISTORY OF PRESENT ILLNESS:  Briefly, Ms. Chrisy Jackson is a 77 year old  female with episodic mental status change issues over the last few  months including some inpatient hospital stays.  There was a question as  to whether she was having TIAs, encephalopathy or seizures.  She was  seen by neuro last hospital stay and started on  Keppra.  Unfortunately,  she did not tolerate the Keppra and she came off of it.  I had  inadvertently thought that the Neurontin would certainly help if there  were some underlying seizures, so we did not replace it with anything.  She was seen on February 23 in my office with some confusion, altered  mental state.  Labs were negative.  Urinalysis was positive.  Cranial CT  was done and was negative and she was sent home on Bactrim.  -This was  enough she was seen the emergency room on February 23 with confusion,  altered mental state.  Labs were negative.  Urinalysis was positive.  Cranial CT was negative.  She was sent home on Bactrim.  We were called  on March 14, 2008 with low blood pressure, altered mental state, T zone  sedation.  She was sent to the ED.  Workup was negative.  Neuro  evaluation was compatible with seizure.  She had a cranial CT which was  negative.  EEG was done and she  was started on Dilantin.  She was given  Rocephin x1 for possible infection, although did not see.  Other  symptoms included difficulty with concentration, thick speech, headache,  troubled gait, decreased memory and then word-finding issues.  It was  felt that she was also a little postictal because by the time I got to  see her later in the day,  she was already starting to become nonfocal  and only had mild confusion.  She was moving all fours and had a very  nonfocal exam.   HOSPITAL COURSE:  Ms. Amye Kupferman was admitted through the emergency room  for 23-hour observation due to altered mental state compatible with  seizure.  She is status post cranial CT which was nothing acute.  The  EKG showed no acute changes.  Chest x-ray was stable cardiomegaly,  otherwise negative.  Urinalysis was negative.  Liver tests were  negative.  BMET was negative.  CBC was negative.  CK and troponin I were  negative.  The exam was nonfocal.  We decreased her Neurontin.  We  decreased her Elavil and restarted  Dilantin. We watched her overnight to  follow up and see if her blood pressure went up from the 90s to low  100s/30s and 40s.  The goal was if her blood pressure was looking good,  we can  discharge her on lower doses.  We placed her on her home  medications and followed her accordingly.   Next morning Dr. Forde Dandy rounded on her.  All vital signs were stable.  Blood pressures better.  Blood sugar were was 113.  Physical exam  including mental state exam was markedly better.  Her medications were  adjusted and felt appropriate and safe to send her home with her  daughter.  Neuro note said 77 year old female with presumed partial  seizure, wanted her to be discharged on Dilantin 300 mg p.o. at bedtime  and to follow up with Dr. Brett Fairy in 2 weeks.   AFTERCARE FOLLOWUP INSTRUCTIONS:  She will follow up with myself and Dr.  Brett Fairy.  She was discharged in stable condition on the day listed  above.      Precious Reel, MD  Electronically Signed     JMR/MEDQ  D:  04/20/2008  T:  04/20/2008  Job:  [id number]

## 2010-10-15 LAB — GLUCOSE, CAPILLARY
Glucose-Capillary: 108 mg/dL — ABNORMAL HIGH (ref 70–99)
Glucose-Capillary: 113 mg/dL — ABNORMAL HIGH (ref 70–99)
Glucose-Capillary: 154 mg/dL — ABNORMAL HIGH (ref 70–99)
Glucose-Capillary: 98 mg/dL (ref 70–99)

## 2010-10-15 LAB — PROTIME-INR
INR: 1.1 (ref 0.00–1.49)
Prothrombin Time: 14.1 seconds (ref 11.6–15.2)

## 2010-10-15 LAB — BASIC METABOLIC PANEL
BUN: 11 mg/dL (ref 6–23)
CO2: 26 mEq/L (ref 19–32)
Calcium: 8.6 mg/dL (ref 8.4–10.5)
Creatinine, Ser: 0.64 mg/dL (ref 0.4–1.2)
Glucose, Bld: 106 mg/dL — ABNORMAL HIGH (ref 70–99)

## 2010-10-15 LAB — URINE CULTURE: Colony Count: 8000

## 2010-10-15 LAB — RAPID URINE DRUG SCREEN, HOSP PERFORMED
Amphetamines: NOT DETECTED
Barbiturates: NOT DETECTED
Opiates: NOT DETECTED

## 2010-10-15 LAB — COMPREHENSIVE METABOLIC PANEL
ALT: 13 U/L (ref 0–35)
AST: 16 U/L (ref 0–37)
CO2: 29 mEq/L (ref 19–32)
Calcium: 8.4 mg/dL (ref 8.4–10.5)
Chloride: 106 mEq/L (ref 96–112)
GFR calc Af Amer: >60 mL/min (ref >60)
GFR calc non Af Amer: >60 mL/min (ref >60)
Potassium: 3.7 mEq/L (ref 3.5–5.1)
Sodium: 139 mEq/L (ref 135–145)

## 2010-10-15 LAB — HEMOGLOBIN A1C
Hgb A1c MFr Bld: 6.7 % — ABNORMAL HIGH (ref 4.6–6.1)
Mean Plasma Glucose: 146 mg/dL

## 2010-10-15 LAB — AMITRIPTYLINE LEVEL: Amitriptyline: 68

## 2010-10-15 LAB — LIPID PANEL
Cholesterol: 118 mg/dL (ref 0–200)
LDL Cholesterol: 53 mg/dL (ref 0–99)
Total CHOL/HDL Ratio: 2.3 RATIO

## 2010-10-15 LAB — CK TOTAL AND CKMB (NOT AT ARMC): Relative Index: INVALID (ref 0.0–2.5)

## 2010-10-15 LAB — CBC
MCHC: 33.2 g/dL (ref 30.0–36.0)
Platelets: 172 10*3/uL (ref 150–400)
RDW: 17.9 % — ABNORMAL HIGH (ref 11.5–15.5)

## 2010-10-15 LAB — URINALYSIS, ROUTINE W REFLEX MICROSCOPIC
Glucose, UA: NEGATIVE mg/dL
Hgb urine dipstick: NEGATIVE
Specific Gravity, Urine: 1.013 (ref 1.005–1.030)
Urobilinogen, UA: 1 mg/dL (ref 0.0–1.0)

## 2011-01-18 DIAGNOSIS — M899 Disorder of bone, unspecified: Secondary | ICD-10-CM | POA: Diagnosis not present

## 2011-01-18 DIAGNOSIS — E785 Hyperlipidemia, unspecified: Secondary | ICD-10-CM | POA: Diagnosis not present

## 2011-01-18 DIAGNOSIS — I10 Essential (primary) hypertension: Secondary | ICD-10-CM | POA: Diagnosis not present

## 2011-01-18 DIAGNOSIS — E1149 Type 2 diabetes mellitus with other diabetic neurological complication: Secondary | ICD-10-CM | POA: Diagnosis not present

## 2011-01-18 DIAGNOSIS — M25519 Pain in unspecified shoulder: Secondary | ICD-10-CM | POA: Diagnosis not present

## 2011-01-18 DIAGNOSIS — I2510 Atherosclerotic heart disease of native coronary artery without angina pectoris: Secondary | ICD-10-CM | POA: Diagnosis not present

## 2011-02-25 DIAGNOSIS — I2510 Atherosclerotic heart disease of native coronary artery without angina pectoris: Secondary | ICD-10-CM | POA: Diagnosis not present

## 2011-03-09 DIAGNOSIS — M7530 Calcific tendinitis of unspecified shoulder: Secondary | ICD-10-CM | POA: Diagnosis not present

## 2011-03-17 DIAGNOSIS — M75100 Unspecified rotator cuff tear or rupture of unspecified shoulder, not specified as traumatic: Secondary | ICD-10-CM | POA: Diagnosis not present

## 2011-03-17 DIAGNOSIS — M67919 Unspecified disorder of synovium and tendon, unspecified shoulder: Secondary | ICD-10-CM | POA: Diagnosis not present

## 2011-03-17 DIAGNOSIS — M719 Bursopathy, unspecified: Secondary | ICD-10-CM | POA: Diagnosis not present

## 2011-03-17 DIAGNOSIS — S43429A Sprain of unspecified rotator cuff capsule, initial encounter: Secondary | ICD-10-CM | POA: Diagnosis not present

## 2011-03-17 DIAGNOSIS — M7530 Calcific tendinitis of unspecified shoulder: Secondary | ICD-10-CM | POA: Diagnosis not present

## 2011-03-23 DIAGNOSIS — M7530 Calcific tendinitis of unspecified shoulder: Secondary | ICD-10-CM | POA: Diagnosis not present

## 2011-04-26 DIAGNOSIS — L299 Pruritus, unspecified: Secondary | ICD-10-CM | POA: Diagnosis not present

## 2011-04-26 DIAGNOSIS — D692 Other nonthrombocytopenic purpura: Secondary | ICD-10-CM | POA: Diagnosis not present

## 2011-04-26 DIAGNOSIS — L253 Unspecified contact dermatitis due to other chemical products: Secondary | ICD-10-CM | POA: Diagnosis not present

## 2011-05-19 DIAGNOSIS — N3946 Mixed incontinence: Secondary | ICD-10-CM | POA: Diagnosis not present

## 2011-05-31 DIAGNOSIS — H9209 Otalgia, unspecified ear: Secondary | ICD-10-CM | POA: Diagnosis not present

## 2011-06-01 DIAGNOSIS — I2510 Atherosclerotic heart disease of native coronary artery without angina pectoris: Secondary | ICD-10-CM | POA: Diagnosis not present

## 2011-06-01 DIAGNOSIS — H9209 Otalgia, unspecified ear: Secondary | ICD-10-CM | POA: Diagnosis not present

## 2011-06-01 DIAGNOSIS — H9190 Unspecified hearing loss, unspecified ear: Secondary | ICD-10-CM | POA: Diagnosis not present

## 2011-07-01 DIAGNOSIS — D1739 Benign lipomatous neoplasm of skin and subcutaneous tissue of other sites: Secondary | ICD-10-CM | POA: Diagnosis not present

## 2011-07-01 DIAGNOSIS — L821 Other seborrheic keratosis: Secondary | ICD-10-CM | POA: Diagnosis not present

## 2011-08-25 DIAGNOSIS — E119 Type 2 diabetes mellitus without complications: Secondary | ICD-10-CM | POA: Diagnosis not present

## 2011-08-25 DIAGNOSIS — H04129 Dry eye syndrome of unspecified lacrimal gland: Secondary | ICD-10-CM | POA: Diagnosis not present

## 2011-09-09 DIAGNOSIS — G589 Mononeuropathy, unspecified: Secondary | ICD-10-CM | POA: Diagnosis not present

## 2011-09-09 DIAGNOSIS — M7530 Calcific tendinitis of unspecified shoulder: Secondary | ICD-10-CM | POA: Diagnosis not present

## 2011-09-09 DIAGNOSIS — I10 Essential (primary) hypertension: Secondary | ICD-10-CM | POA: Diagnosis not present

## 2011-09-09 DIAGNOSIS — R42 Dizziness and giddiness: Secondary | ICD-10-CM | POA: Diagnosis not present

## 2011-09-09 DIAGNOSIS — E538 Deficiency of other specified B group vitamins: Secondary | ICD-10-CM | POA: Diagnosis not present

## 2011-09-09 DIAGNOSIS — E1149 Type 2 diabetes mellitus with other diabetic neurological complication: Secondary | ICD-10-CM | POA: Diagnosis not present

## 2011-09-09 DIAGNOSIS — R0609 Other forms of dyspnea: Secondary | ICD-10-CM | POA: Diagnosis not present

## 2011-09-09 DIAGNOSIS — Z23 Encounter for immunization: Secondary | ICD-10-CM | POA: Diagnosis not present

## 2011-09-20 ENCOUNTER — Other Ambulatory Visit: Payer: Self-pay | Admitting: Orthopedic Surgery

## 2011-10-07 ENCOUNTER — Encounter: Payer: Self-pay | Admitting: Cardiology

## 2011-10-07 ENCOUNTER — Other Ambulatory Visit (HOSPITAL_COMMUNITY): Payer: Medicare Other

## 2011-10-07 DIAGNOSIS — I119 Hypertensive heart disease without heart failure: Secondary | ICD-10-CM | POA: Insufficient documentation

## 2011-10-07 DIAGNOSIS — E1169 Type 2 diabetes mellitus with other specified complication: Secondary | ICD-10-CM | POA: Insufficient documentation

## 2011-10-07 DIAGNOSIS — E669 Obesity, unspecified: Secondary | ICD-10-CM | POA: Insufficient documentation

## 2011-10-07 DIAGNOSIS — I2510 Atherosclerotic heart disease of native coronary artery without angina pectoris: Secondary | ICD-10-CM | POA: Insufficient documentation

## 2011-10-07 DIAGNOSIS — K219 Gastro-esophageal reflux disease without esophagitis: Secondary | ICD-10-CM | POA: Insufficient documentation

## 2011-10-07 DIAGNOSIS — I10 Essential (primary) hypertension: Secondary | ICD-10-CM | POA: Insufficient documentation

## 2011-10-07 DIAGNOSIS — L109 Pemphigus, unspecified: Secondary | ICD-10-CM | POA: Insufficient documentation

## 2011-10-07 DIAGNOSIS — E1159 Type 2 diabetes mellitus with other circulatory complications: Secondary | ICD-10-CM | POA: Insufficient documentation

## 2011-10-07 DIAGNOSIS — E1142 Type 2 diabetes mellitus with diabetic polyneuropathy: Secondary | ICD-10-CM | POA: Insufficient documentation

## 2011-10-07 DIAGNOSIS — E785 Hyperlipidemia, unspecified: Secondary | ICD-10-CM | POA: Insufficient documentation

## 2011-10-10 ENCOUNTER — Encounter (HOSPITAL_COMMUNITY): Admission: RE | Admit: 2011-10-10 | Payer: TRICARE For Life (TFL) | Source: Ambulatory Visit

## 2011-10-11 DIAGNOSIS — K219 Gastro-esophageal reflux disease without esophagitis: Secondary | ICD-10-CM | POA: Diagnosis not present

## 2011-10-11 DIAGNOSIS — I2510 Atherosclerotic heart disease of native coronary artery without angina pectoris: Secondary | ICD-10-CM | POA: Diagnosis not present

## 2011-10-11 DIAGNOSIS — E1149 Type 2 diabetes mellitus with other diabetic neurological complication: Secondary | ICD-10-CM | POA: Diagnosis not present

## 2011-10-11 DIAGNOSIS — I10 Essential (primary) hypertension: Secondary | ICD-10-CM | POA: Diagnosis not present

## 2011-10-13 ENCOUNTER — Ambulatory Visit (HOSPITAL_COMMUNITY): Admission: RE | Admit: 2011-10-13 | Payer: Medicare Other | Source: Ambulatory Visit | Admitting: Orthopedic Surgery

## 2011-10-13 ENCOUNTER — Encounter (HOSPITAL_COMMUNITY): Admission: RE | Payer: Self-pay | Source: Ambulatory Visit

## 2011-10-13 SURGERY — ARTHROSCOPY, SHOULDER, WITH ROTATOR CUFF REPAIR
Anesthesia: Choice | Laterality: Left

## 2011-10-14 ENCOUNTER — Other Ambulatory Visit: Payer: TRICARE For Life (TFL)

## 2011-10-17 ENCOUNTER — Ambulatory Visit (INDEPENDENT_AMBULATORY_CARE_PROVIDER_SITE_OTHER): Payer: Medicare Other | Admitting: *Deleted

## 2011-10-17 DIAGNOSIS — I6529 Occlusion and stenosis of unspecified carotid artery: Secondary | ICD-10-CM

## 2011-10-25 DIAGNOSIS — I2510 Atherosclerotic heart disease of native coronary artery without angina pectoris: Secondary | ICD-10-CM | POA: Diagnosis not present

## 2011-10-25 DIAGNOSIS — K219 Gastro-esophageal reflux disease without esophagitis: Secondary | ICD-10-CM | POA: Diagnosis not present

## 2011-10-25 DIAGNOSIS — I10 Essential (primary) hypertension: Secondary | ICD-10-CM | POA: Diagnosis not present

## 2011-10-25 DIAGNOSIS — E1149 Type 2 diabetes mellitus with other diabetic neurological complication: Secondary | ICD-10-CM | POA: Diagnosis not present

## 2011-11-08 ENCOUNTER — Other Ambulatory Visit: Payer: Self-pay | Admitting: Orthopedic Surgery

## 2011-11-17 ENCOUNTER — Encounter (HOSPITAL_COMMUNITY)
Admission: RE | Admit: 2011-11-17 | Discharge: 2011-11-17 | Payer: TRICARE For Life (TFL) | Source: Ambulatory Visit | Attending: Orthopedic Surgery | Admitting: Orthopedic Surgery

## 2011-11-17 NOTE — Pre-Procedure Instructions (Signed)
[place]  11/17/2011   Your procedure is scheduled on:  Thursday  12/01/11    Report to [facility] at 730 AM.  Call this number if you have problems the morning of surgery: [phone]   Remember:   Do not eat food or drink After Midnight.    Take these medicines the morning of surgery with A SIP OF WATER:  none   Do not wear jewelry, make-up or nail polish.  Do not wear lotions, powders, or perfumes. You may wear deodorant.  Do not shave 48 hours prior to surgery. Men may shave face and neck.  Do not bring valuables to the hospital.  Contacts, dentures or bridgework may not be worn into surgery.  Leave suitcase in the car. After surgery it may be brought to your room.  For patients admitted to the hospital, checkout time is 11:00 AM the day of discharge.   Patients discharged the day of surgery will not be allowed to drive home.  Name and phone number of your driver:   Special Instructions: Shower using CHG 2 nights before surgery and the night before surgery.  If you shower the day of surgery use CHG.  Use special wash - you have one bottle of CHG for all showers.  You should use approximately 1/3 of the bottle for each shower.   Please read over the following fact sheets that you were given: Pain Booklet, MRSA, PCR FACT SHEET

## 2011-11-21 ENCOUNTER — Encounter (HOSPITAL_COMMUNITY): Payer: Self-pay | Admitting: Pharmacy Technician

## 2011-11-22 ENCOUNTER — Encounter (HOSPITAL_COMMUNITY): Payer: Self-pay

## 2011-11-23 ENCOUNTER — Encounter (HOSPITAL_COMMUNITY): Payer: Self-pay | Admitting: Pharmacy Technician

## 2011-11-25 ENCOUNTER — Ambulatory Visit (HOSPITAL_COMMUNITY)
Admission: RE | Admit: 2011-11-25 | Discharge: 2011-11-25 | Disposition: A | Discharge status: Home / Self Care | Payer: Medicare Other | Source: Ambulatory Visit | Attending: Anesthesiology | Admitting: Anesthesiology

## 2011-11-25 ENCOUNTER — Encounter (HOSPITAL_COMMUNITY): Payer: Self-pay

## 2011-11-25 ENCOUNTER — Encounter (HOSPITAL_COMMUNITY)
Admission: RE | Admit: 2011-11-25 | Discharge: 2011-11-25 | Disposition: A | Discharge status: Home / Self Care | Payer: Medicare Other | Source: Ambulatory Visit | Attending: Orthopedic Surgery | Admitting: Orthopedic Surgery

## 2011-11-25 ENCOUNTER — Other Ambulatory Visit: Payer: Self-pay

## 2011-11-25 DIAGNOSIS — I2510 Atherosclerotic heart disease of native coronary artery without angina pectoris: Secondary | ICD-10-CM | POA: Diagnosis not present

## 2011-11-25 DIAGNOSIS — Z01811 Encounter for preprocedural respiratory examination: Secondary | ICD-10-CM | POA: Diagnosis not present

## 2011-11-25 DIAGNOSIS — I10 Essential (primary) hypertension: Secondary | ICD-10-CM | POA: Insufficient documentation

## 2011-11-25 DIAGNOSIS — Z01818 Encounter for other preprocedural examination: Secondary | ICD-10-CM | POA: Diagnosis not present

## 2011-11-25 HISTORY — DX: Cardiac murmur, unspecified: R01.1

## 2011-11-25 HISTORY — DX: Urinary calculus, unspecified: N20.9

## 2011-11-25 HISTORY — DX: Sleep apnea, unspecified: G47.30

## 2011-11-25 HISTORY — DX: Urinary tract infection, site not specified: N39.0

## 2011-11-25 HISTORY — DX: Essential (primary) hypertension: I10

## 2011-11-25 HISTORY — DX: Headache: R51

## 2011-11-25 HISTORY — DX: Epistaxis: R04.0

## 2011-11-25 HISTORY — DX: Angina pectoris, unspecified: I20.9

## 2011-11-25 LAB — BASIC METABOLIC PANEL
BUN: 11 mg/dL (ref 6–23)
CO2: 31 mEq/L (ref 19–32)
Calcium: 9.4 mg/dL (ref 8.4–10.5)
GFR calc non Af Amer: 85 mL/min — ABNORMAL LOW (ref >90)
Glucose, Bld: 116 mg/dL — ABNORMAL HIGH (ref 70–99)
Potassium: 3.9 mEq/L (ref 3.5–5.1)

## 2011-11-25 LAB — CBC
Hemoglobin: 13.6 g/dL (ref 12.0–15.0)
MCH: 27.2 pg (ref 26.0–34.0)
MCHC: 32.6 g/dL (ref 30.0–36.0)
MCV: 83.4 fL (ref 78.0–100.0)
RBC: 5 MIL/uL (ref 3.87–5.11)

## 2011-11-25 NOTE — Pre-Procedure Instructions (Addendum)
[place]  11/25/2011   Your procedure is scheduled on:12/01/11  Report to [facility] at 730 AM.  Call this number if you have problems the morning of surgery: [phone]   Remember:   Do not eat food or drink:After Midnight.   Take these medicines the morning of surgery with A SIP OF WATER:cymbalta, neurontin, metoprolol, , detrol STOP aspirin   Do not wear jewelry, make-up or nail polish.  Do not wear lotions, powders, or perfumes.   Do not shave 48 hours prior to surgery. Men may shave face and neck.  Do not bring valuables to the hospital.  Contacts, dentures or bridgework may not be worn into surgery.  Leave suitcase in the car. After surgery it may be brought to your room.  For patients admitted to the hospital, checkout time is 11:00 AM the day of discharge.   Patients discharged the day of surgery will not be allowed to drive home.  Name and phone number of your driver:dtr [phone] sheila bennett  Special Instructions: Incentive Spirometry - Practice and bring it with you on the day of surgery. Shower using CHG 2 nights before surgery and the night before surgery.  If you shower the day of surgery use CHG.  Use special wash - you have one bottle of CHG for all showers.  You should use approximately 1/3 of the bottle for each shower.   Please read over the following fact sheets that you were given: Pain Booklet, Coughing and Deep Breathing, MRSA Information and Surgical Site Infection Prevention

## 2011-11-25 NOTE — Progress Notes (Signed)
req'd ekg, notes from dr Radford Pax dr chandler's office to delete/ update orders

## 2011-11-30 MED ORDER — CEFAZOLIN SODIUM-DEXTROSE 2-3 GM-% IV SOLR
2.0000 g | INTRAVENOUS | Status: AC
  Administered 2011-12-01: 2 g via INTRAVENOUS
  Filled 2011-11-30: qty 50

## 2011-12-01 ENCOUNTER — Encounter (HOSPITAL_COMMUNITY)
Admission: RE | Disposition: A | Discharge status: Home / Self Care | Payer: Self-pay | Source: Ambulatory Visit | Attending: Orthopedic Surgery

## 2011-12-01 ENCOUNTER — Encounter (HOSPITAL_COMMUNITY): Payer: Self-pay | Admitting: Anesthesiology

## 2011-12-01 ENCOUNTER — Ambulatory Visit (HOSPITAL_COMMUNITY)
Admission: RE | Admit: 2011-12-01 | Discharge: 2011-12-01 | Disposition: A | Discharge status: Home / Self Care | Payer: Medicare Other | Source: Ambulatory Visit | Attending: Orthopedic Surgery | Admitting: Orthopedic Surgery

## 2011-12-01 ENCOUNTER — Ambulatory Visit (HOSPITAL_COMMUNITY): Payer: Medicare Other | Admitting: Anesthesiology

## 2011-12-01 ENCOUNTER — Encounter (HOSPITAL_COMMUNITY): Payer: Self-pay | Admitting: *Deleted

## 2011-12-01 DIAGNOSIS — E1142 Type 2 diabetes mellitus with diabetic polyneuropathy: Secondary | ICD-10-CM | POA: Insufficient documentation

## 2011-12-01 DIAGNOSIS — G8918 Other acute postprocedural pain: Secondary | ICD-10-CM | POA: Diagnosis not present

## 2011-12-01 DIAGNOSIS — M24119 Other articular cartilage disorders, unspecified shoulder: Secondary | ICD-10-CM | POA: Diagnosis not present

## 2011-12-01 DIAGNOSIS — M6520 Calcific tendinitis, unspecified site: Secondary | ICD-10-CM

## 2011-12-01 DIAGNOSIS — L109 Pemphigus, unspecified: Secondary | ICD-10-CM | POA: Diagnosis not present

## 2011-12-01 DIAGNOSIS — I119 Hypertensive heart disease without heart failure: Secondary | ICD-10-CM | POA: Diagnosis not present

## 2011-12-01 DIAGNOSIS — M25519 Pain in unspecified shoulder: Secondary | ICD-10-CM | POA: Diagnosis not present

## 2011-12-01 DIAGNOSIS — G4730 Sleep apnea, unspecified: Secondary | ICD-10-CM | POA: Insufficient documentation

## 2011-12-01 DIAGNOSIS — E785 Hyperlipidemia, unspecified: Secondary | ICD-10-CM | POA: Diagnosis not present

## 2011-12-01 DIAGNOSIS — I798 Other disorders of arteries, arterioles and capillaries in diseases classified elsewhere: Secondary | ICD-10-CM | POA: Diagnosis not present

## 2011-12-01 DIAGNOSIS — M7530 Calcific tendinitis of unspecified shoulder: Secondary | ICD-10-CM | POA: Diagnosis not present

## 2011-12-01 DIAGNOSIS — I2510 Atherosclerotic heart disease of native coronary artery without angina pectoris: Secondary | ICD-10-CM | POA: Diagnosis not present

## 2011-12-01 DIAGNOSIS — E1159 Type 2 diabetes mellitus with other circulatory complications: Secondary | ICD-10-CM | POA: Insufficient documentation

## 2011-12-01 DIAGNOSIS — E1149 Type 2 diabetes mellitus with other diabetic neurological complication: Secondary | ICD-10-CM | POA: Insufficient documentation

## 2011-12-01 DIAGNOSIS — Z951 Presence of aortocoronary bypass graft: Secondary | ICD-10-CM | POA: Insufficient documentation

## 2011-12-01 DIAGNOSIS — Z7982 Long term (current) use of aspirin: Secondary | ICD-10-CM | POA: Insufficient documentation

## 2011-12-01 DIAGNOSIS — K219 Gastro-esophageal reflux disease without esophagitis: Secondary | ICD-10-CM | POA: Diagnosis not present

## 2011-12-01 DIAGNOSIS — Z79899 Other long term (current) drug therapy: Secondary | ICD-10-CM | POA: Insufficient documentation

## 2011-12-01 HISTORY — PX: SHOULDER ARTHROSCOPY WITH ROTATOR CUFF REPAIR AND SUBACROMIAL DECOMPRESSION: SHX5686

## 2011-12-01 LAB — GLUCOSE, CAPILLARY
Glucose-Capillary: 172 mg/dL — ABNORMAL HIGH (ref 70–99)
Glucose-Capillary: 143 mg/dL — ABNORMAL HIGH (ref 70–99)

## 2011-12-01 SURGERY — SHOULDER ARTHROSCOPY WITH ROTATOR CUFF REPAIR AND SUBACROMIAL DECOMPRESSION
Anesthesia: Choice | Laterality: Left

## 2011-12-01 MED ORDER — GLYCOPYRROLATE 0.2 MG/ML IJ SOLN
INTRAMUSCULAR | Status: DC | PRN
  Administered 2011-12-01: .8 mg via INTRAVENOUS

## 2011-12-01 MED ORDER — FENTANYL CITRATE 0.05 MG/ML IJ SOLN: 50.0000 ug | INTRAMUSCULAR | Status: DC | PRN

## 2011-12-01 MED ORDER — ROCURONIUM BROMIDE 100 MG/10ML IV SOLN
INTRAVENOUS | Status: DC | PRN
  Administered 2011-12-01: 40 mg via INTRAVENOUS

## 2011-12-01 MED ORDER — ARTIFICIAL TEARS OP OINT
TOPICAL_OINTMENT | OPHTHALMIC | Status: DC | PRN
  Administered 2011-12-01: 1 via OPHTHALMIC

## 2011-12-01 MED ORDER — HYDROMORPHONE HCL PF 1 MG/ML IJ SOLN: 0.2500 mg | INTRAMUSCULAR | Status: DC | PRN

## 2011-12-01 MED ORDER — NEOSTIGMINE METHYLSULFATE 1 MG/ML IJ SOLN
INTRAMUSCULAR | Status: DC | PRN
  Administered 2011-12-01: 5 mg via INTRAVENOUS

## 2011-12-01 MED ORDER — PROMETHAZINE HCL 25 MG/ML IJ SOLN: 6.2500 mg | INTRAMUSCULAR | Status: DC | PRN

## 2011-12-01 MED ORDER — PHENYLEPHRINE HCL 10 MG/ML IJ SOLN
10.0000 mg | INTRAVENOUS | Status: DC | PRN
  Administered 2011-12-01: 15 ug/min via INTRAVENOUS

## 2011-12-01 MED ORDER — MIDAZOLAM HCL 2 MG/2ML IJ SOLN: 0.5000 mg | Freq: Once | INTRAMUSCULAR | Status: DC | PRN

## 2011-12-01 MED ORDER — PROPOFOL 10 MG/ML IV BOLUS
INTRAVENOUS | Status: DC | PRN
  Administered 2011-12-01: 100 mg via INTRAVENOUS

## 2011-12-01 MED ORDER — LABETALOL HCL 5 MG/ML IV SOLN
INTRAVENOUS | Status: DC | PRN
  Administered 2011-12-01: 2.5 mg via INTRAVENOUS

## 2011-12-01 MED ORDER — LACTATED RINGERS IV SOLN
INTRAVENOUS | Status: DC | PRN
  Administered 2011-12-01: 10:00:00 via INTRAVENOUS

## 2011-12-01 MED ORDER — LACTATED RINGERS IV SOLN
INTRAVENOUS | Status: DC
  Administered 2011-12-01: 09:00:00 via INTRAVENOUS

## 2011-12-01 MED ORDER — LIDOCAINE HCL (CARDIAC) 20 MG/ML IV SOLN
INTRAVENOUS | Status: DC | PRN
  Administered 2011-12-01: 20 mg via INTRAVENOUS

## 2011-12-01 MED ORDER — FENTANYL CITRATE 0.05 MG/ML IJ SOLN
INTRAMUSCULAR | Status: DC | PRN
  Administered 2011-12-01: 200 ug via INTRAVENOUS
  Administered 2011-12-01: 50 ug via INTRAVENOUS

## 2011-12-01 MED ORDER — PHENYLEPHRINE HCL 10 MG/ML IJ SOLN
INTRAMUSCULAR | Status: DC | PRN
  Administered 2011-12-01 (×2): 80 ug via INTRAVENOUS
  Administered 2011-12-01 (×5): 40 ug via INTRAVENOUS

## 2011-12-01 MED ORDER — MIDAZOLAM HCL 2 MG/2ML IJ SOLN: 1.0000 mg | INTRAMUSCULAR | Status: DC | PRN

## 2011-12-01 MED ORDER — SODIUM CHLORIDE 0.9 % IR SOLN
Status: DC | PRN
  Administered 2011-12-01: 3000 mL

## 2011-12-01 MED ORDER — ONDANSETRON HCL 4 MG/2ML IJ SOLN
INTRAMUSCULAR | Status: DC | PRN
  Administered 2011-12-01: 4 mg via INTRAVENOUS

## 2011-12-01 MED ORDER — HYDROCODONE-ACETAMINOPHEN 7.5-325 MG PO TABS: ORAL_TABLET | ORAL | Status: DC

## 2011-12-01 MED ORDER — MIDAZOLAM HCL 5 MG/5ML IJ SOLN
INTRAMUSCULAR | Status: DC | PRN
  Administered 2011-12-01 (×2): 1 mg via INTRAVENOUS

## 2011-12-01 MED ORDER — POVIDONE-IODINE 7.5 % EX SOLN: Freq: Once | CUTANEOUS | Status: DC

## 2011-12-01 MED ORDER — MEPERIDINE HCL 25 MG/ML IJ SOLN: 6.2500 mg | INTRAMUSCULAR | Status: DC | PRN

## 2011-12-01 MED ORDER — BUPIVACAINE-EPINEPHRINE PF 0.5-1:200000 % IJ SOLN
INTRAMUSCULAR | Status: DC | PRN
  Administered 2011-12-01: 30 mL

## 2011-12-01 SURGICAL SUPPLY — 65 items
BLADE LONG MED 31X9 (MISCELLANEOUS) IMPLANT
BLADE SURG 11 STRL SS (BLADE) ×2 IMPLANT
BUR OVAL 4.0 (BURR) ×2 IMPLANT
CANISTER OMNI JUG 16 LITER (MISCELLANEOUS) ×1 IMPLANT
CANNULA 5.75X71 LONG (CANNULA) ×2 IMPLANT
CHLORAPREP W/TINT 26ML (MISCELLANEOUS) ×2 IMPLANT
CLOTH BEACON ORANGE TIMEOUT ST (SAFETY) ×2 IMPLANT
COVER SURGICAL LIGHT HANDLE (MISCELLANEOUS) ×2 IMPLANT
DRAPE INCISE IOBAN 66X45 STRL (DRAPES) ×2 IMPLANT
DRAPE STERI 35X30 U-POUCH (DRAPES) ×2 IMPLANT
DRAPE U-SHAPE 47X51 STRL (DRAPES) ×2 IMPLANT
DRILL BIT PANALOK RC 3.2 (BIT) IMPLANT
DRSG EMULSION OIL 3X3 NADH (GAUZE/BANDAGES/DRESSINGS) ×4 IMPLANT
DRSG PAD ABDOMINAL 8X10 ST (GAUZE/BANDAGES/DRESSINGS) ×3 IMPLANT
ELECT CAUTERY BLADE 6.4 (BLADE) IMPLANT
ELECT NDL TIP 2.8 STRL (NEEDLE) IMPLANT
ELECT NEEDLE TIP 2.8 STRL (NEEDLE) IMPLANT
ELECT REM PT RETURN 9FT ADLT (ELECTROSURGICAL) ×2
ELECTRODE REM PT RTRN 9FT ADLT (ELECTROSURGICAL) IMPLANT
GAUZE XEROFORM 1X8 LF (GAUZE/BANDAGES/DRESSINGS) ×2 IMPLANT
GLOVE BIO SURGEON STRL SZ7 (GLOVE) ×2 IMPLANT
GLOVE BIO SURGEON STRL SZ7.5 (GLOVE) ×2 IMPLANT
GLOVE BIOGEL PI IND STRL 8 (GLOVE) ×1 IMPLANT
GLOVE BIOGEL PI INDICATOR 8 (GLOVE) ×1
GOWN PREVENTION PLUS LG XLONG (DISPOSABLE) ×2 IMPLANT
GOWN STRL NON-REIN LRG LVL3 (GOWN DISPOSABLE) ×4 IMPLANT
KIT BASIN OR (CUSTOM PROCEDURE TRAY) ×2 IMPLANT
KIT ROOM TURNOVER OR (KITS) ×2 IMPLANT
MANIFOLD NEPTUNE II (INSTRUMENTS) ×2 IMPLANT
NDL HYPO 25GX1X1/2 BEV (NEEDLE) ×1 IMPLANT
NDL SPNL 18GX3.5 QUINCKE PK (NEEDLE) ×1 IMPLANT
NDL SUT 6 .5 CRC .975X.05 MAYO (NEEDLE) IMPLANT
NEEDLE HYPO 25GX1X1/2 BEV (NEEDLE) ×2 IMPLANT
NEEDLE MAYO TAPER (NEEDLE)
NEEDLE SPNL 18GX3.5 QUINCKE PK (NEEDLE) ×2 IMPLANT
NS IRRIG 1000ML POUR BTL (IV SOLUTION) ×2 IMPLANT
PACK SHOULDER (CUSTOM PROCEDURE TRAY) ×2 IMPLANT
PAD ARMBOARD 7.5X6 YLW CONV (MISCELLANEOUS) ×4 IMPLANT
RESECTOR FULL RADIUS 4.2MM (BLADE) ×2 IMPLANT
SET ARTHROSCOPY TUBING (MISCELLANEOUS)
SET ARTHROSCOPY TUBING LN (MISCELLANEOUS) ×1 IMPLANT
SLING ARM FOAM STRAP LRG (SOFTGOODS) ×2 IMPLANT
SLING ARM FOAM STRAP MED (SOFTGOODS) ×1 IMPLANT
SPONGE GAUZE 4X4 12PLY (GAUZE/BANDAGES/DRESSINGS) ×2 IMPLANT
SPONGE LAP 4X18 X RAY DECT (DISPOSABLE) ×1 IMPLANT
SUCTION FRAZIER TIP 10 FR DISP (SUCTIONS) IMPLANT
SUPPORT WRAP ARM LG (MISCELLANEOUS) ×2 IMPLANT
SUT BONE WAX W31G (SUTURE) IMPLANT
SUT ETHIBOND 2 OS 4 DA (SUTURE) IMPLANT
SUT ETHIBOND NAB BRD #0 18IN (SUTURE) IMPLANT
SUT ETHILON 3 0 PS 1 (SUTURE) ×2 IMPLANT
SUT FIBERWIRE #2 38 T-5 BLUE (SUTURE)
SUT VIC AB 0 CT2 27 (SUTURE) IMPLANT
SUT VIC AB 2-0 CT1 27 (SUTURE)
SUT VIC AB 2-0 CT1 TAPERPNT 27 (SUTURE) IMPLANT
SUT VIC AB 2-0 FS1 27 (SUTURE) IMPLANT
SUTURE FIBERWR #2 38 T-5 BLUE (SUTURE) IMPLANT
SYR CONTROL 10ML LL (SYRINGE) ×2 IMPLANT
TAPE CLOTH SURG 4X10 WHT LF (GAUZE/BANDAGES/DRESSINGS) ×1 IMPLANT
TOWEL OR 17X24 6PK STRL BLUE (TOWEL DISPOSABLE) ×2 IMPLANT
TOWEL OR 17X26 10 PK STRL BLUE (TOWEL DISPOSABLE) ×2 IMPLANT
TUBE CONNECTING 12X1/4 (SUCTIONS) ×2 IMPLANT
TUBING ARTHROSCOPY IRRIG 16FT (MISCELLANEOUS) ×1 IMPLANT
WAND 90 DEG TURBOVAC W/CORD (SURGICAL WAND) ×2 IMPLANT
WATER STERILE IRR 1000ML POUR (IV SOLUTION) ×2 IMPLANT

## 2011-12-01 NOTE — Anesthesia Postprocedure Evaluation (Signed)
  Anesthesia Post-op Note  Patient: Linda Oliver  Procedure(s) Performed: Procedure(s) (LRB) with comments: SHOULDER ARTHROSCOPY WITH ROTATOR CUFF REPAIR AND SUBACROMIAL DECOMPRESSION (Left) - shoulder debridement calcific tendonitis  Patient Location: PACU  Anesthesia Type:GA combined with regional for post-op pain  Level of Consciousness: awake, alert , oriented and patient cooperative  Airway and Oxygen Therapy: Patient Spontanous Breathing and Patient connected to nasal cannula oxygen  Post-op Pain: none  Post-op Assessment: Post-op Vital signs reviewed, Patient's Cardiovascular Status Stable, Respiratory Function Stable, Patent Airway, No signs of Nausea or vomiting and Pain level controlled  Post-op Vital Signs: Reviewed and stable  Complications: No apparent anesthesia complications

## 2011-12-01 NOTE — Anesthesia Procedure Notes (Addendum)
Anesthesia Regional Block:  Interscalene brachial plexus block  Pre-Anesthetic Checklist: ,, timeout performed, Correct Patient, Correct Site, Correct Laterality, Correct Procedure, Correct Position, site marked, Risks and benefits discussed,  Surgical consent,  Pre-op evaluation,  At surgeon's request and post-op pain management  Laterality: Left  Prep: chloraprep       Needles:  Injection technique: Single-shot  Needle Type: Stimulator Needle - 40     Needle Length: 4cm  Needle Gauge: 22 and 22 G    Additional Needles:  Procedures: nerve stimulator Interscalene brachial plexus block  Nerve Stimulator or Paresthesia:  Response: forearm twitch, 0.45 mA, 0.1 ms,   Additional Responses:   Narrative:  Start time: 12/01/2011 9:36 AM End time: 12/01/2011 9:40 AM Injection made incrementally with aspirations every 5 mL.  Performed by: Personally  Anesthesiologist: Jenita Seashore, MD  Additional Notes: Pt identified in Holding room.  Monitors applied. Working IV access confirmed. Sterile prep L neck.  #22ga PNS to forearm twitch at 0.37mA threshold.  30cc 0.5% Bupivacaine with 1:200k epi injected incrementally after negative test dose.  Patient asymptomatic, VSS, no heme aspirated, tolerated well.   Jenita Seashore, MD  Interscalene brachial plexus block Procedure Name: Intubation Date/Time: 12/01/2011 9:59 AM Performed by: Erik Obey Pre-anesthesia Checklist: Patient identified, Timeout performed, Emergency Drugs available, Suction available and Patient being monitored Patient Re-evaluated:Patient Re-evaluated prior to inductionOxygen Delivery Method: Circle system utilized Preoxygenation: Pre-oxygenation with 100% oxygen Intubation Type: IV induction Ventilation: Two handed mask ventilation required Laryngoscope Size: Mac and 3 Grade View: Grade III Tube type: Oral Tube size: 7.5 mm Number of attempts: 1 Airway Equipment and Method: Stylet Placement Confirmation: ETT  inserted through vocal cords under direct vision,  positive ETCO2 and breath sounds checked- equal and bilateral Secured at: 23 cm Tube secured with: Tape Dental Injury: Teeth and Oropharynx as per pre-operative assessment

## 2011-12-01 NOTE — Anesthesia Preprocedure Evaluation (Signed)
Anesthesia Evaluation  Patient identified by MRN, date of birth, ID band Patient awake    Reviewed: Allergy & Precautions, H&P , NPO status , Patient's Chart, lab work & pertinent test results, reviewed documented beta blocker date and time   History of Anesthesia Complications Negative for: history of anesthetic complications  Airway Mallampati: II TM Distance: >3 FB Neck ROM: Full    Dental  (+) Teeth Intact and Dental Advisory Given   Pulmonary sleep apnea and Oxygen sleep apnea , former smoker,  breath sounds clear to auscultation  Pulmonary exam normal       Cardiovascular hypertension, Angina: no chest pain in years. + CAD and + CABG - Valvular Problems/MurmursRhythm:Regular Rate:Normal  '11 stress test: no ischemia, EF 68%   Neuro/Psych  Neuromuscular disease (peripheral neuropathy) negative psych ROS   GI/Hepatic Neg liver ROS, GERD-  Medicated and Controlled,  Endo/Other  diabetes (glu 172), Well Controlled, Type 2, Oral Hypoglycemic AgentsMorbid obesity  Renal/GU negative Renal ROS     Musculoskeletal  (+) Arthritis -,   Abdominal (+) + obese,   Peds  Hematology negative hematology ROS (+)   Anesthesia Other Findings   Reproductive/Obstetrics                           Anesthesia Physical Anesthesia Plan  ASA: III  Anesthesia Plan: General   Post-op Pain Management:    Induction: Intravenous  Airway Management Planned: Oral ETT  Additional Equipment:   Intra-op Plan:   Post-operative Plan: Extubation in OR  Informed Consent: I have reviewed the patients History and Physical, chart, labs and discussed the procedure including the risks, benefits and alternatives for the proposed anesthesia with the patient or authorized representative who has indicated his/her understanding and acceptance.   Dental advisory given  Plan Discussed with: CRNA and Surgeon  Anesthesia Plan  Comments: (Plan routine monitors, GETA with interscalene block for post op analgesia)        Anesthesia Quick Evaluation

## 2011-12-01 NOTE — Preoperative (Signed)
Beta Blockers   Reason not to administer Beta Blockers:Not Applicable 

## 2011-12-01 NOTE — Op Note (Signed)
Procedure(s): SHOULDER ARTHROSCOPY WITH ROTATOR CUFF REPAIR AND SUBACROMIAL DECOMPRESSION Procedure Note  Linda Oliver female 78 y.o. 12/01/2011  Procedure(s) and Anesthesia Type: #1 left shoulder arthroscopic subacromial decompression #2 left shoulder arthroscopic debridement of superior labral tear and calcific tendinitis  Surgeon(s) and Role:    * Nita Sells, MD - Primary     Surgeon: Nita Sells   Assistants: Jeanmarie Hubert PA-C  Anesthesia: General endotracheal anesthesia with preoperative interscalene block     Procedure Detail  Estimated Blood Loss: Min         Drains: none  Blood Given: none         Specimens: none        Complications:  * No complications entered in OR log *         Disposition: PACU - hemodynamically stable.         Condition: stable    Procedure:   INDICATIONS FOR SURGERY: The patient is 78 y.o. female who has had a long history of bilateral calcific tendinitis. The right was treated surgically and she did very well. She wished to have the same treatment for the left after failing conservative management.  OPERATIVE FINDINGS: Examination under anesthesia: No stiffness or instability Diagnostic Arthroscopy:  Glenoid articular cartilage: Intact Humeral head articular cartilage: Intact Labrum: Superior labrum with partial thickness fraying and tearing, no detachment of the biceps root. Loose bodies: None Synovitis: Mild Articular sided rotator cuff: Notable calcific deposit centrally which was debrided Bursal sided rotator cuff: Intact Coracoacromial ligament: Frayed indicating chronic impingement, moderate sized acromial spur anteriorly taking down the standard acromioplasty  DESCRIPTION OF PROCEDURE: The patient was identified in preoperative  holding area where I personally marked the operative site after  verifying site, side, and procedure with the patient. An interscalene block was given by the  attending anesthesiologist the holding area.  The patient was taken back to the operating room where general anesthesia was induced without complication and was placed in the beach-chair position with the back  elevated about 60 degrees and all extremities and head and neck carefully padded and  positioned.   The left upper extremity was then prepped and  draped in a standard sterile fashion. The appropriate time-out  procedure was carried out. The patient did receive IV antibiotics  within 30 minutes of incision.   A small posterior portal incision was made and the arthroscope was introduced into the joint. An anterior portal was then established above the subscapularis using needle localization. Small cannula was placed anteriorly. Diagnostic arthroscopy was then carried out with findings as described above.  The superior labrum was noted to have some fraying and small flap tears which were debrided with shaver. The  biceps root was intact. Articular cartilage was intact. The undersurface of the supraspinatus was intact but was noted to have calcific deposit centrally. It was felt this would potentially be addressed from the bursal side toes initially left on.  The arthroscope was then introduced into the subacromial space a standard lateral portal was established with needle localization. The shaver was used through the lateral portal to perform extensive bursectomy. Coracoacromial ligament was examined and found to be significantly frayed indicating chronic impingement.  After bursectomy a spinal needle was used to carefully probe along the insertion of the supraspinatus to try and find a calcific deposit. No significant calcific deposit was found or with palpation with a probe.  The coracoacromial ligament was taken down off the anterior acromion with the ArthroCare  exposing a moderate sized anterior acromial spur. A high-speed bur was then used through the lateral portal to take down the  anterior acromial spur from lateral to medial in a standard acromioplasty.  The acromioplasty was also viewed from the lateral portal and the bur was used as necessary to ensure that the acromion was completely flat from posterior to anterior.  I was concerned about the calcific deposit on the articular surface and therefore the camera was reintroduced into the joint and a probe was used to bluntly dissect open the area where the calcific deposit was in the supraspinatus insertion. Then used a shaver in this region the completely resected deposit.  The arthroscopic equipment was removed from the joint and the portals were closed with 3-0 nylon in an interrupted fashion. Sterile dressings were then applied including Xeroform 4 x 4's ABDs and tape. The patient was then allowed to awaken from general anesthesia, placed in a sling, transferred to the stretcher and taken to the recovery room in stable condition.   POSTOPERATIVE PLAN: The patient will be discharged home today and will followup in one week for suture removal and wound check.  She will have Norco tablets for pain control.

## 2011-12-01 NOTE — H&P (Signed)
Linda Oliver is an 78 y.o. female.   Chief Complaint: L shoulder pain HPI:  L shoulder calcific tendonitis with persistent symptoms despite conservative management.    Past Medical History  Diagnosis Date  . CAD (coronary artery disease)     [place]   80% stenosis ostial Left main, 30% stenosis proximal LAD, 80% stenosis proximal RCA CABG  -  LIMA to LAD, SVG to int, SVG to [place], [place]   . Hypertensive heart disease   . Type 2 diabetes mellitus with vascular disease   . Bullous pemphigus   . Diabetic peripheral neuropathy   . GERD (gastroesophageal reflux disease)   . Hyperlipidemia   . Obesity (BMI 30-39.9)   . Anginal pain     occ  . Hypertension   . Sleep apnea     dx 71yrs ago doesnt use cpap but uses O2 at nite 2L  . Heart murmur     mvp  . Stones in the urinary tract   . UTI (lower urinary tract infection)   . Headache     hx migraines  . Nasal bleeding     occ due to O2    Past Surgical History  Procedure Date  . Coronary artery bypass graft 96  . Cardiac catheterization   . Abdominal hysterectomy 70's  . Cholecystectomy   . Shoulder arthroscopy     rt    History reviewed. No pertinent family history. Social History:  reports that she quit smoking about 25 years ago. Her smoking use included Cigarettes. She has a 30 pack-year smoking history. She does not have any smokeless tobacco history on file. She reports that she does not drink alcohol or use illicit drugs.  Allergies:  Allergies  Allergen Reactions  . Percocet (Oxycodone-Acetaminophen) Nausea And Vomiting  . Latex Itching and Rash    ekg leads    Medications Prior to Admission  Medication Sig Dispense Refill  . amitriptyline (ELAVIL) 25 MG tablet Take 25 mg by mouth at bedtime.      Marland Kitchen aspirin EC 325 MG tablet Take 325 mg by mouth daily.      Marland Kitchen BIOTIN PO Take 1 capsule by mouth daily.      Mariane Baumgarten Calcium (STOOL SOFTENER PO) Take 1 tablet by mouth daily.      .  DULoxetine (CYMBALTA) 60 MG capsule Take 60 mg by mouth daily.      Marland Kitchen gabapentin (NEURONTIN) 600 MG tablet Take 600 mg by mouth 2 (two) times daily.      Marland Kitchen HYDROcodone-acetaminophen (NORCO/VICODIN) 5-325 MG per tablet Take 1 tablet by mouth every 6 (six) hours as needed.      . hydrOXYzine (ATARAX/VISTARIL) 25 MG tablet Take 25 mg by mouth 2 (two) times daily as needed. For itching      . losartan (COZAAR) 50 MG tablet Take 50 mg by mouth 2 (two) times daily.      . metoprolol succinate (TOPROL-XL) 25 MG 24 hr tablet Take 12.5 mg by mouth 2 (two) times daily.      . mometasone (NASONEX) 50 MCG/ACT nasal spray Place 2 sprays into the nose daily as needed. For congestion      . pantoprazole (PROTONIX) 40 MG tablet Take 40 mg by mouth daily.      . rosuvastatin (CRESTOR) 10 MG tablet Take 10 mg by mouth daily.      . sitaGLIPtin (JANUVIA) 100 MG tablet Take 100 mg by mouth  daily.      . tolterodine (DETROL LA) 4 MG 24 hr capsule Take 4 mg by mouth daily.        Results for orders placed during the hospital encounter of 12/01/11 (from the past 48 hour(s))  GLUCOSE, CAPILLARY     Status: Abnormal   Collection Time   12/01/11  8:31 AM      Component Value Range Comment   Glucose-Capillary 172 (*) 70 - 99 mg/dL    No results found.  Review of Systems  All other systems reviewed and are negative.    Blood pressure 127/70, pulse 64, temperature 97.7 F (36.5 C), temperature source Oral, resp. rate 18, SpO2 94.00%. Physical Exam  Constitutional: She is oriented to person, place, and time. She appears well-developed and well-nourished.  HENT:  Head: Atraumatic.  Eyes: EOM are normal.  Cardiovascular: Intact distal pulses.   Respiratory: Effort normal.  Musculoskeletal:       Left shoulder: She exhibits tenderness.  Neurological: She is alert and oriented to person, place, and time.  Skin: Skin is warm and dry.  Psychiatric: She has a normal mood and affect.     Assessment/Plan L  shoulder calcific tendonitis Plan L arthroscopic resection/SAD Risks / benefits of surgery discussed Consent on chart  NPO for OR Preop antibiotics   Evelena Masci WILLIAM 12/01/2011, 9:36 AM

## 2011-12-01 NOTE — Transfer of Care (Signed)
Immediate Anesthesia Transfer of Care Note  Patient: Linda Oliver  Procedure(s) Performed: Procedure(s) (LRB) with comments: SHOULDER ARTHROSCOPY WITH ROTATOR CUFF REPAIR AND SUBACROMIAL DECOMPRESSION (Left) - shoulder debridement calcific tendonitis  Patient Location: PACU  Anesthesia Type:General  Level of Consciousness: awake, alert  and oriented  Airway & Oxygen Therapy: Patient Spontanous Breathing and Patient connected to face mask oxygen  Post-op Assessment: Report given to PACU RN, Post -op Vital signs reviewed and stable and Patient moving all extremities X 4  Post vital signs: Reviewed and stable  Complications: No apparent anesthesia complications

## 2011-12-02 ENCOUNTER — Encounter (HOSPITAL_COMMUNITY): Payer: Self-pay | Admitting: Orthopedic Surgery

## 2011-12-07 DIAGNOSIS — M7530 Calcific tendinitis of unspecified shoulder: Secondary | ICD-10-CM | POA: Diagnosis not present

## 2011-12-13 DIAGNOSIS — E669 Obesity, unspecified: Secondary | ICD-10-CM | POA: Diagnosis not present

## 2011-12-13 DIAGNOSIS — E1149 Type 2 diabetes mellitus with other diabetic neurological complication: Secondary | ICD-10-CM | POA: Diagnosis not present

## 2011-12-13 DIAGNOSIS — M7120 Synovial cyst of popliteal space [Baker], unspecified knee: Secondary | ICD-10-CM | POA: Diagnosis not present

## 2011-12-13 DIAGNOSIS — M25519 Pain in unspecified shoulder: Secondary | ICD-10-CM | POA: Diagnosis not present

## 2011-12-14 ENCOUNTER — Ambulatory Visit (INDEPENDENT_AMBULATORY_CARE_PROVIDER_SITE_OTHER): Payer: Medicare Other | Admitting: *Deleted

## 2011-12-14 DIAGNOSIS — M79609 Pain in unspecified limb: Secondary | ICD-10-CM | POA: Diagnosis not present

## 2011-12-14 DIAGNOSIS — R609 Edema, unspecified: Secondary | ICD-10-CM

## 2011-12-14 DIAGNOSIS — R52 Pain, unspecified: Secondary | ICD-10-CM

## 2012-01-19 DIAGNOSIS — E1149 Type 2 diabetes mellitus with other diabetic neurological complication: Secondary | ICD-10-CM | POA: Diagnosis not present

## 2012-02-03 DIAGNOSIS — Z1331 Encounter for screening for depression: Secondary | ICD-10-CM | POA: Diagnosis not present

## 2012-02-03 DIAGNOSIS — R634 Abnormal weight loss: Secondary | ICD-10-CM | POA: Diagnosis not present

## 2012-02-03 DIAGNOSIS — R112 Nausea with vomiting, unspecified: Secondary | ICD-10-CM | POA: Diagnosis not present

## 2012-02-06 DIAGNOSIS — K5730 Diverticulosis of large intestine without perforation or abscess without bleeding: Secondary | ICD-10-CM | POA: Diagnosis not present

## 2012-02-06 DIAGNOSIS — K5900 Constipation, unspecified: Secondary | ICD-10-CM | POA: Diagnosis not present

## 2012-03-23 ENCOUNTER — Other Ambulatory Visit: Payer: Self-pay

## 2012-04-18 ENCOUNTER — Ambulatory Visit
Admission: RE | Admit: 2012-04-18 | Discharge: 2012-04-18 | Disposition: A | Discharge status: Home / Self Care | Payer: Medicare Other | Source: Ambulatory Visit

## 2012-04-18 DIAGNOSIS — Z1231 Encounter for screening mammogram for malignant neoplasm of breast: Secondary | ICD-10-CM

## 2012-05-01 DIAGNOSIS — R634 Abnormal weight loss: Secondary | ICD-10-CM | POA: Diagnosis not present

## 2012-05-01 DIAGNOSIS — G4733 Obstructive sleep apnea (adult) (pediatric): Secondary | ICD-10-CM | POA: Diagnosis not present

## 2012-05-01 DIAGNOSIS — R413 Other amnesia: Secondary | ICD-10-CM | POA: Diagnosis not present

## 2012-05-01 DIAGNOSIS — R42 Dizziness and giddiness: Secondary | ICD-10-CM | POA: Diagnosis not present

## 2012-05-01 DIAGNOSIS — E669 Obesity, unspecified: Secondary | ICD-10-CM | POA: Diagnosis not present

## 2012-05-01 DIAGNOSIS — F329 Major depressive disorder, single episode, unspecified: Secondary | ICD-10-CM | POA: Diagnosis not present

## 2012-05-01 DIAGNOSIS — N318 Other neuromuscular dysfunction of bladder: Secondary | ICD-10-CM | POA: Diagnosis not present

## 2012-05-01 DIAGNOSIS — E785 Hyperlipidemia, unspecified: Secondary | ICD-10-CM | POA: Diagnosis not present

## 2012-05-01 DIAGNOSIS — I10 Essential (primary) hypertension: Secondary | ICD-10-CM | POA: Diagnosis not present

## 2012-05-01 DIAGNOSIS — E1149 Type 2 diabetes mellitus with other diabetic neurological complication: Secondary | ICD-10-CM | POA: Diagnosis not present

## 2012-05-02 DIAGNOSIS — R42 Dizziness and giddiness: Secondary | ICD-10-CM | POA: Diagnosis not present

## 2012-05-02 DIAGNOSIS — R413 Other amnesia: Secondary | ICD-10-CM | POA: Diagnosis not present

## 2012-06-25 DIAGNOSIS — N3020 Other chronic cystitis without hematuria: Secondary | ICD-10-CM | POA: Diagnosis not present

## 2012-06-25 DIAGNOSIS — N3946 Mixed incontinence: Secondary | ICD-10-CM | POA: Diagnosis not present

## 2012-06-27 DIAGNOSIS — M25569 Pain in unspecified knee: Secondary | ICD-10-CM | POA: Diagnosis not present

## 2012-07-02 DIAGNOSIS — M25569 Pain in unspecified knee: Secondary | ICD-10-CM | POA: Diagnosis not present

## 2012-07-05 DIAGNOSIS — G589 Mononeuropathy, unspecified: Secondary | ICD-10-CM | POA: Diagnosis not present

## 2012-07-05 DIAGNOSIS — M1990 Unspecified osteoarthritis, unspecified site: Secondary | ICD-10-CM | POA: Diagnosis not present

## 2012-07-05 DIAGNOSIS — R42 Dizziness and giddiness: Secondary | ICD-10-CM | POA: Diagnosis not present

## 2012-07-26 DIAGNOSIS — I69998 Other sequelae following unspecified cerebrovascular disease: Secondary | ICD-10-CM | POA: Diagnosis not present

## 2012-07-30 DIAGNOSIS — I69998 Other sequelae following unspecified cerebrovascular disease: Secondary | ICD-10-CM | POA: Diagnosis not present

## 2012-08-02 DIAGNOSIS — R42 Dizziness and giddiness: Secondary | ICD-10-CM | POA: Diagnosis not present

## 2012-08-02 DIAGNOSIS — I69998 Other sequelae following unspecified cerebrovascular disease: Secondary | ICD-10-CM | POA: Diagnosis not present

## 2012-08-07 DIAGNOSIS — H905 Unspecified sensorineural hearing loss: Secondary | ICD-10-CM | POA: Diagnosis not present

## 2012-08-07 DIAGNOSIS — H8190 Unspecified disorder of vestibular function, unspecified ear: Secondary | ICD-10-CM | POA: Diagnosis not present

## 2012-08-08 DIAGNOSIS — R42 Dizziness and giddiness: Secondary | ICD-10-CM | POA: Diagnosis not present

## 2012-08-14 DIAGNOSIS — E785 Hyperlipidemia, unspecified: Secondary | ICD-10-CM | POA: Diagnosis not present

## 2012-08-14 DIAGNOSIS — R42 Dizziness and giddiness: Secondary | ICD-10-CM | POA: Diagnosis not present

## 2012-08-14 DIAGNOSIS — R413 Other amnesia: Secondary | ICD-10-CM | POA: Diagnosis not present

## 2012-08-14 DIAGNOSIS — E1149 Type 2 diabetes mellitus with other diabetic neurological complication: Secondary | ICD-10-CM | POA: Diagnosis not present

## 2012-08-14 DIAGNOSIS — K219 Gastro-esophageal reflux disease without esophagitis: Secondary | ICD-10-CM | POA: Diagnosis not present

## 2012-08-14 DIAGNOSIS — I69998 Other sequelae following unspecified cerebrovascular disease: Secondary | ICD-10-CM | POA: Diagnosis not present

## 2012-08-14 DIAGNOSIS — N318 Other neuromuscular dysfunction of bladder: Secondary | ICD-10-CM | POA: Diagnosis not present

## 2012-08-14 DIAGNOSIS — G589 Mononeuropathy, unspecified: Secondary | ICD-10-CM | POA: Diagnosis not present

## 2012-08-14 DIAGNOSIS — I10 Essential (primary) hypertension: Secondary | ICD-10-CM | POA: Diagnosis not present

## 2012-08-21 DIAGNOSIS — R42 Dizziness and giddiness: Secondary | ICD-10-CM | POA: Diagnosis not present

## 2012-08-24 DIAGNOSIS — I69998 Other sequelae following unspecified cerebrovascular disease: Secondary | ICD-10-CM | POA: Diagnosis not present

## 2012-08-24 DIAGNOSIS — R42 Dizziness and giddiness: Secondary | ICD-10-CM | POA: Diagnosis not present

## 2012-08-28 DIAGNOSIS — I69998 Other sequelae following unspecified cerebrovascular disease: Secondary | ICD-10-CM | POA: Diagnosis not present

## 2012-09-03 DIAGNOSIS — I69998 Other sequelae following unspecified cerebrovascular disease: Secondary | ICD-10-CM | POA: Diagnosis not present

## 2012-09-11 DIAGNOSIS — I69998 Other sequelae following unspecified cerebrovascular disease: Secondary | ICD-10-CM | POA: Diagnosis not present

## 2012-09-13 DIAGNOSIS — I69998 Other sequelae following unspecified cerebrovascular disease: Secondary | ICD-10-CM | POA: Diagnosis not present

## 2012-09-18 DIAGNOSIS — I69998 Other sequelae following unspecified cerebrovascular disease: Secondary | ICD-10-CM | POA: Diagnosis not present

## 2012-09-27 DIAGNOSIS — E119 Type 2 diabetes mellitus without complications: Secondary | ICD-10-CM | POA: Diagnosis not present

## 2012-09-27 DIAGNOSIS — H35349 Macular cyst, hole, or pseudohole, unspecified eye: Secondary | ICD-10-CM | POA: Diagnosis not present

## 2012-09-27 DIAGNOSIS — Z794 Long term (current) use of insulin: Secondary | ICD-10-CM | POA: Diagnosis not present

## 2012-09-27 DIAGNOSIS — Z961 Presence of intraocular lens: Secondary | ICD-10-CM | POA: Diagnosis not present

## 2012-09-27 DIAGNOSIS — H35379 Puckering of macula, unspecified eye: Secondary | ICD-10-CM | POA: Diagnosis not present

## 2012-10-01 DIAGNOSIS — H35379 Puckering of macula, unspecified eye: Secondary | ICD-10-CM | POA: Diagnosis not present

## 2012-10-01 DIAGNOSIS — H35349 Macular cyst, hole, or pseudohole, unspecified eye: Secondary | ICD-10-CM | POA: Diagnosis not present

## 2012-10-03 DIAGNOSIS — H35349 Macular cyst, hole, or pseudohole, unspecified eye: Secondary | ICD-10-CM | POA: Diagnosis not present

## 2012-10-03 DIAGNOSIS — H35379 Puckering of macula, unspecified eye: Secondary | ICD-10-CM | POA: Diagnosis not present

## 2012-10-23 DIAGNOSIS — I10 Essential (primary) hypertension: Secondary | ICD-10-CM | POA: Diagnosis not present

## 2012-10-23 DIAGNOSIS — M899 Disorder of bone, unspecified: Secondary | ICD-10-CM | POA: Diagnosis not present

## 2012-10-23 DIAGNOSIS — E538 Deficiency of other specified B group vitamins: Secondary | ICD-10-CM | POA: Diagnosis not present

## 2012-10-23 DIAGNOSIS — Z23 Encounter for immunization: Secondary | ICD-10-CM | POA: Diagnosis not present

## 2012-10-23 DIAGNOSIS — E785 Hyperlipidemia, unspecified: Secondary | ICD-10-CM | POA: Diagnosis not present

## 2012-10-23 DIAGNOSIS — E1149 Type 2 diabetes mellitus with other diabetic neurological complication: Secondary | ICD-10-CM | POA: Diagnosis not present

## 2012-10-23 DIAGNOSIS — Z6829 Body mass index (BMI) 29.0-29.9, adult: Secondary | ICD-10-CM | POA: Diagnosis not present

## 2012-10-23 DIAGNOSIS — G589 Mononeuropathy, unspecified: Secondary | ICD-10-CM | POA: Diagnosis not present

## 2012-10-23 DIAGNOSIS — G4733 Obstructive sleep apnea (adult) (pediatric): Secondary | ICD-10-CM | POA: Diagnosis not present

## 2012-10-26 DIAGNOSIS — E1149 Type 2 diabetes mellitus with other diabetic neurological complication: Secondary | ICD-10-CM | POA: Diagnosis not present

## 2012-11-13 DIAGNOSIS — H35349 Macular cyst, hole, or pseudohole, unspecified eye: Secondary | ICD-10-CM | POA: Diagnosis not present

## 2012-11-13 DIAGNOSIS — H35379 Puckering of macula, unspecified eye: Secondary | ICD-10-CM | POA: Diagnosis not present

## 2013-01-28 DIAGNOSIS — N318 Other neuromuscular dysfunction of bladder: Secondary | ICD-10-CM | POA: Diagnosis not present

## 2013-01-28 DIAGNOSIS — G4733 Obstructive sleep apnea (adult) (pediatric): Secondary | ICD-10-CM | POA: Diagnosis not present

## 2013-01-28 DIAGNOSIS — F3289 Other specified depressive episodes: Secondary | ICD-10-CM | POA: Diagnosis not present

## 2013-01-28 DIAGNOSIS — F329 Major depressive disorder, single episode, unspecified: Secondary | ICD-10-CM | POA: Diagnosis not present

## 2013-01-28 DIAGNOSIS — IMO0001 Reserved for inherently not codable concepts without codable children: Secondary | ICD-10-CM | POA: Diagnosis not present

## 2013-01-28 DIAGNOSIS — I2510 Atherosclerotic heart disease of native coronary artery without angina pectoris: Secondary | ICD-10-CM | POA: Diagnosis not present

## 2013-01-28 DIAGNOSIS — E669 Obesity, unspecified: Secondary | ICD-10-CM | POA: Diagnosis not present

## 2013-01-28 DIAGNOSIS — E785 Hyperlipidemia, unspecified: Secondary | ICD-10-CM | POA: Diagnosis not present

## 2013-01-28 DIAGNOSIS — I10 Essential (primary) hypertension: Secondary | ICD-10-CM | POA: Diagnosis not present

## 2013-03-19 DIAGNOSIS — H35349 Macular cyst, hole, or pseudohole, unspecified eye: Secondary | ICD-10-CM | POA: Diagnosis not present

## 2013-03-19 DIAGNOSIS — H35379 Puckering of macula, unspecified eye: Secondary | ICD-10-CM | POA: Diagnosis not present

## 2013-04-08 ENCOUNTER — Encounter: Payer: Self-pay | Admitting: Neurology

## 2013-04-09 ENCOUNTER — Encounter (INDEPENDENT_AMBULATORY_CARE_PROVIDER_SITE_OTHER): Payer: Self-pay

## 2013-04-09 ENCOUNTER — Encounter: Payer: Self-pay | Admitting: Neurology

## 2013-04-09 ENCOUNTER — Ambulatory Visit (INDEPENDENT_AMBULATORY_CARE_PROVIDER_SITE_OTHER): Payer: Medicare Other | Admitting: Neurology

## 2013-04-09 VITALS — BP 123/59 | HR 62 | Ht 62.0 in | Wt 153.0 lb

## 2013-04-09 DIAGNOSIS — F09 Unspecified mental disorder due to known physiological condition: Secondary | ICD-10-CM

## 2013-04-09 DIAGNOSIS — R4189 Other symptoms and signs involving cognitive functions and awareness: Secondary | ICD-10-CM

## 2013-04-09 NOTE — Progress Notes (Signed)
[facility]    Provider:  Dr Janann Colonel Referring Provider: Precious Reel, MD Primary Care Physician:  Precious Reel, MD  CC:  Cognitive decline  HPI:  Linda Oliver is a 80 y.o. female here as a referral from Dr. Virgina Jock for cognitive decline  Daughter notes symptoms have been ongoing for a few years and have gotten progressively worse. Daughter notices she will be telling a story and then just forget what comes next. Trouble with short term memory, difficulty recalling what they just talked about. Currently driving, denies any difficulty, no accidents. Has noted some episodes of getting lost. Wakes up frequently overnight with need to urinate. She notes one episode of a visual hallucination, described as a hazy shadow. Notes some difficulty with remote memory but overall feels that is still intact.   She currently lives alone, her daughter takes care of finances and medications, has been doing this for the past few years. Has been hospitalized a few times in the past with question of stroke, no stroke noted and daughter suspects patients may have been managing her medications incorrectly in the past.   Has history of DM, HLD, HTN. Has neuropathy for which she takes Cymbalta and Gabapentin. Has occasional episodes of dizziness which she takes meclizine for. Described as feeling light headed and sensation of almost passing out. No known family history of cognitive decline. Has been told her B12 is low and has received injections in the past, currently taking oral B12 daily.   Review of Systems: Out of a complete 14 system review, the patient complains of only the following symptoms, and all other reviewed systems are negative. + memory decline, slurred speech  History   Social History  . Marital Status: Married    Spouse Name: N/A    Number of Children: N/A  . Years of Education: N/A   Occupational History  . Not on file.   Social History Main Topics  . Smoking status:  Former Smoker -- 1.00 packs/day for 30 years    Types: Cigarettes    Quit date: 11/25/1986  . Smokeless tobacco: Not on file  . Alcohol Use: No  . Drug Use: No  . Sexual Activity: Yes    Birth Control/ Protection: Post-menopausal   Other Topics Concern  . Not on file   Social History Narrative   Married, has 1 child    Family History  Problem Relation Age of Onset  . Cancer Brother   . Heart disease Father   . Cancer Father     skin  . Coronary artery disease Mother   . Cancer - Colon Brother     brain, Lymphoma    Past Medical History  Diagnosis Date  . CAD (coronary artery disease)     [place]   80% stenosis ostial Left main, 30% stenosis proximal LAD, 80% stenosis proximal RCA CABG  -  LIMA to LAD, SVG to int, SVG to [place], [place]   . Hypertensive heart disease   . Type 2 diabetes mellitus with vascular disease   . Bullous pemphigus   . Diabetic peripheral neuropathy   . GERD (gastroesophageal reflux disease)   . Hyperlipidemia   . Obesity (BMI 30-39.9)   . Anginal pain     occ  . Hypertension   . Sleep apnea     dx 87yrs ago doesnt use cpap but uses O2 at nite 2L  . Heart murmur     mvp  .  Stones in the urinary tract   . UTI (lower urinary tract infection)   . Headache(784.0)     hx migraines  . Nasal bleeding     occ due to O2  . Peripheral neuropathy   . Osteopenia   . OA (osteoarthritis)   . Broken ankle   . Seizure     2009    Past Surgical History  Procedure Laterality Date  . Coronary artery bypass graft  96  . Cardiac catheterization    . Abdominal hysterectomy  70's  . Cholecystectomy    . Shoulder arthroscopy      rt  . Shoulder arthroscopy with rotator cuff repair and subacromial decompression  12/01/2011    Procedure: SHOULDER ARTHROSCOPY WITH ROTATOR CUFF REPAIR AND SUBACROMIAL DECOMPRESSION;  Surgeon: Nita Sells, MD;  Location: [facility];  Service: Orthopedics;  Laterality: Left;  shoulder  debridement calcific tendonitis    Current Outpatient Prescriptions  Medication Sig Dispense Refill  . amitriptyline (ELAVIL) 25 MG tablet Take 25 mg by mouth at bedtime.      Marland Kitchen aspirin EC 325 MG tablet Take 325 mg by mouth daily.      Marland Kitchen BIOTIN PO Take 1 capsule by mouth daily.      Mariane Baumgarten Calcium (STOOL SOFTENER PO) Take 1 tablet by mouth daily.      . DULoxetine (CYMBALTA) 60 MG capsule Take 60 mg by mouth daily.      Marland Kitchen gabapentin (NEURONTIN) 600 MG tablet Take 600 mg by mouth 2 (two) times daily.      Marland Kitchen glucose blood test strip 1 each by Other route as needed for other. Use as instructed      . HYDROcodone-acetaminophen (NORCO) 7.5-325 MG per tablet 1-2 every 4-6 hours as needed for pain.  30 tablet  0  . hydrOXYzine (ATARAX/VISTARIL) 25 MG tablet Take 25 mg by mouth 2 (two) times daily as needed. For itching      . Insulin Glargine (LANTUS SOLOSTAR) 100 UNIT/ML Solostar Pen Inject 100 Units into the skin daily at 10 pm.      . Insulin Pen Needle (B-D UF III MINI PEN NEEDLES) 31G X 5 MM MISC by Does not apply route.      Marland Kitchen losartan (COZAAR) 50 MG tablet Take 50 mg by mouth 2 (two) times daily.      . meclizine (ANTIVERT) 25 MG tablet Take 25 mg by mouth 2 (two) times daily.      . metoprolol succinate (TOPROL-XL) 25 MG 24 hr tablet Take 12.5 mg by mouth 2 (two) times daily.      . mometasone (NASONEX) 50 MCG/ACT nasal spray Place 2 sprays into the nose daily as needed. For congestion      . pantoprazole (PROTONIX) 40 MG tablet Take 40 mg by mouth daily.      . rosuvastatin (CRESTOR) 10 MG tablet Take 10 mg by mouth daily.      Marland Kitchen scopolamine (TRANSDERM-SCOP) 1 MG/3DAYS Place 1 patch onto the skin every 3 (three) days.      . sitaGLIPtin (JANUVIA) 100 MG tablet Take 100 mg by mouth daily.      . solifenacin (VESICARE) 5 MG tablet Take 5 mg by mouth daily.      Marland Kitchen tolterodine (DETROL LA) 4 MG 24 hr capsule Take 4 mg by mouth daily.       No current facility-administered medications for  this visit.    Allergies as of 04/09/2013 - Review Complete 04/09/2013  Allergen Reaction Noted  . Percocet [oxycodone-acetaminophen] Nausea And Vomiting 09/30/2011  . Latex Itching and Rash 11/22/2011    Vitals: BP 123/59  Pulse 62  Ht 5\' 2"  (1.575 m)  Wt 153 lb (69.4 kg)  BMI 27.98 kg/m2 Last Weight:  Wt Readings from Last 1 Encounters:  04/09/13 153 lb (69.4 kg)   Last Height:   Ht Readings from Last 1 Encounters:  04/09/13 5\' 2"  (1.575 m)     Physical exam: Exam: Gen: NAD, conversant Eyes: anicteric sclerae, moist conjunctivae HENT: Atraumatic, oropharynx clear Neck: Trachea midline; supple,  Lungs: CTA, no wheezing, rales, rhonic                          CV: RRR, no MRG Abdomen: Soft, non-tender;  Extremities: No peripheral edema  Skin: Normal temperature, no rash,  Psych: Appropriate affect, pleasant  Neuro: MS:  MOCA 17/30   CN: PERRL, EOMI no nystagmus, no ptosis, sensation intact to LT V1-V3 bilat, face symmetric, no weakness, hearing grossly intact, palate elevates symmetrically, shoulder shrug 5/5 bilat,  tongue protrudes midline, no fasiculations noted.  Motor: normal bulk and tone Strength: 5/5  In all extremities  Coord: rapid alternating and point-to-point (FNF, HTS) movements intact.  Reflexes: diminished patellar, absent achilles, bilat downgoing toes  Sens: decreased LT bilateral LE to mid shin  Gait: stands slowly without assistance, unable to tandem. Romberg absent.   Assessment:  After physical and neurologic examination, review of laboratory studies, imaging, neurophysiology testing and pre-existing records, assessment will be reviewed on the problem list.  Plan:  Treatment plan and additional workup will be reviewed under Problem List.  1)cognitive decline 2)HTN 3)DM  80y/o woman presenting for initial evaluation of cognitive decline. Patient and daughter note it is predominantly a problem with short term memory and they feel  it has been getting progressively worse. Exam pertinent for a MOCA of 17/30 and signs of mild peripheral neuropathy. Based on history of HTN, HLD, DM will check MRI brain to rule out a vascular dementia. Will check B12, MMA and TSH (patient requests blood work done with Dr. Virgina Jock). If workup unremarkable will consider starting Aricept 5mg  daily. Workup and plan discussed with patient who expressed understanding.   Jim Like, DO  [facility] [address] [place], [place] [address]  Phone [phone] Fax [phone]

## 2013-04-09 NOTE — Patient Instructions (Signed)
Overall you are doing fairly well but I do want to suggest a few things today:   Remember to drink plenty of fluid, eat healthy meals and do not skip any meals. Try to eat protein with a every meal and eat a healthy snack such as fruit or nuts in between meals. Try to keep a regular sleep-wake schedule and try to exercise daily, particularly in the form of walking, 20-30 minutes a day, if you can.   As far as diagnostic testing:  1)I would like to check a MRI of your brain. We will call you to schedule this 2)Next time you have blood work with Dr. Virgina Jock, please have him check a B12, MMA and TSH  If your workup is negative we can consider starting you on a medication to boost your memory.   I would like to see you back in 6 months, sooner if we need to. Please call us with any interim questions, concerns, problems, updates or refill requests.   My clinical assistant and will answer any of your questions and relay your messages to me and also relay most of my messages to you.   Our phone number is [phone]. We also have an after hours call service for urgent matters and there is a physician on-call for urgent questions. For any emergencies you know to call 911 or go to the nearest emergency room

## 2013-04-17 ENCOUNTER — Ambulatory Visit
Admission: RE | Admit: 2013-04-17 | Discharge: 2013-04-17 | Disposition: A | Discharge status: Home / Self Care | Payer: Medicare Other | Source: Ambulatory Visit | Attending: Neurology | Admitting: Neurology

## 2013-04-17 DIAGNOSIS — R413 Other amnesia: Secondary | ICD-10-CM | POA: Diagnosis not present

## 2013-04-17 DIAGNOSIS — F09 Unspecified mental disorder due to known physiological condition: Secondary | ICD-10-CM | POA: Diagnosis not present

## 2013-04-17 DIAGNOSIS — R4189 Other symptoms and signs involving cognitive functions and awareness: Secondary | ICD-10-CM

## 2013-04-19 DIAGNOSIS — H903 Sensorineural hearing loss, bilateral: Secondary | ICD-10-CM | POA: Diagnosis not present

## 2013-04-19 NOTE — Progress Notes (Signed)
Quick Note:  Left vm informing patient of normal MRI. ______

## 2013-04-26 DIAGNOSIS — M6530 Trigger finger, unspecified finger: Secondary | ICD-10-CM | POA: Diagnosis not present

## 2013-04-26 DIAGNOSIS — M720 Palmar fascial fibromatosis [Dupuytren]: Secondary | ICD-10-CM | POA: Diagnosis not present

## 2013-04-30 DIAGNOSIS — F3289 Other specified depressive episodes: Secondary | ICD-10-CM | POA: Diagnosis not present

## 2013-04-30 DIAGNOSIS — E669 Obesity, unspecified: Secondary | ICD-10-CM | POA: Diagnosis not present

## 2013-04-30 DIAGNOSIS — E785 Hyperlipidemia, unspecified: Secondary | ICD-10-CM | POA: Diagnosis not present

## 2013-04-30 DIAGNOSIS — I10 Essential (primary) hypertension: Secondary | ICD-10-CM | POA: Diagnosis not present

## 2013-04-30 DIAGNOSIS — F329 Major depressive disorder, single episode, unspecified: Secondary | ICD-10-CM | POA: Diagnosis not present

## 2013-04-30 DIAGNOSIS — I2510 Atherosclerotic heart disease of native coronary artery without angina pectoris: Secondary | ICD-10-CM | POA: Diagnosis not present

## 2013-04-30 DIAGNOSIS — R413 Other amnesia: Secondary | ICD-10-CM | POA: Diagnosis not present

## 2013-04-30 DIAGNOSIS — G589 Mononeuropathy, unspecified: Secondary | ICD-10-CM | POA: Diagnosis not present

## 2013-04-30 DIAGNOSIS — IMO0001 Reserved for inherently not codable concepts without codable children: Secondary | ICD-10-CM | POA: Diagnosis not present

## 2013-05-07 ENCOUNTER — Telehealth: Payer: Self-pay | Admitting: *Deleted

## 2013-05-07 NOTE — Telephone Encounter (Signed)
Daughter calling to get diagnosis for mothers condition after Dr Janann Colonel reviewed MRI and test ordered by Dr Virgina Jock.  He stated he would call her back.  Please call patient and advise.  Thanks

## 2013-05-09 ENCOUNTER — Other Ambulatory Visit: Payer: Self-pay | Admitting: Neurology

## 2013-05-09 DIAGNOSIS — M6530 Trigger finger, unspecified finger: Secondary | ICD-10-CM | POA: Diagnosis not present

## 2013-05-09 DIAGNOSIS — M720 Palmar fascial fibromatosis [Dupuytren]: Secondary | ICD-10-CM | POA: Diagnosis not present

## 2013-05-09 MED ORDER — DONEPEZIL HCL 5 MG PO TABS
5.0000 mg | ORAL_TABLET | Freq: Every day | ORAL | Status: DC
Start: 2013-05-09 — End: 2013-05-13

## 2013-05-09 NOTE — Telephone Encounter (Addendum)
Spoke with daughter and she would like to know the next step going forward with patient,and what new medicines she will be placed on

## 2013-05-09 NOTE — Telephone Encounter (Signed)
Please let her know we will start Aricept 5mg  nightly. The prescription was sent to the pharmacy. Thanks.

## 2013-05-09 NOTE — Telephone Encounter (Signed)
Informed daughter per Dr Hazle Quant note, she verbalized understanding

## 2013-05-13 ENCOUNTER — Telehealth: Payer: Self-pay | Admitting: *Deleted

## 2013-05-13 ENCOUNTER — Other Ambulatory Visit: Payer: Self-pay

## 2013-05-13 MED ORDER — DONEPEZIL HCL 5 MG PO TABS: 5.0000 mg | ORAL_TABLET | Freq: Every day | ORAL | Status: DC

## 2013-05-13 NOTE — Telephone Encounter (Signed)
Linda Oliver, Daughter calling to let you know rx was at pharmacy as you stated.  She's requesting you call her back after 3:00 day @ [phone].  Thanks

## 2013-05-13 NOTE — Telephone Encounter (Signed)
It appears the Rx was not sent to the local pharmacy, but was sent to mail order.  I have resent the Rx to local pharm.  Called the patient's daughter back, got no answer.  Left message.

## 2013-05-13 NOTE — Telephone Encounter (Signed)
Patient's daughter is wanting to know which pharmacy prescription (aricept)was sent

## 2013-05-14 NOTE — Telephone Encounter (Signed)
Called daughter and she did receive the message

## 2013-05-23 ENCOUNTER — Telehealth: Payer: Self-pay | Admitting: Neurology

## 2013-05-23 NOTE — Telephone Encounter (Signed)
We sent the Rx for Aricept 5mg  one nightly.  I called back.  Spoke with Freda Munro.  She said they had not picked up the 5mg  Rx from [facility] yet.  They got the Rx from [facility] for Aricept 10mg , with directions of take one half tablet daily.  They were apparently out of stock on 5mg  tabs.  She asked me if ins would allow the patient to continue getting her Rx at the local pharm since they cannot get the 5mg  tabs via mail order at this time.  I told her I was not sure how this patients policy is set up, and recommended they contact ins regarding this.  She will pick up the 5mg  Rx from local pharmacy and will call us back if needed.  I called [facility] at [phone].  I spoke with Anderson Malta who then transferred me to [facility].  He said the patient is able to get this med through local pharmacy and does not have to use mail order.  He said he has marked the account saying patient does not wish to get 10mg  tabs, and they will not fill meds in the future unless the patient calls them.  I called Freda Munro back, she is aware.

## 2013-05-23 NOTE — Telephone Encounter (Signed)
Pt's daughter Freda Munro called concerning the medication donepezil (ARICEPT) 5 MG tablet, she was advised that pt needed to cut these in half but when she was reading the label it states do not cut in half or crush. Please call Freda Munro back concerning this matter. Thanks

## 2013-06-12 ENCOUNTER — Other Ambulatory Visit: Payer: Self-pay | Admitting: Internal Medicine

## 2013-06-12 DIAGNOSIS — Z1231 Encounter for screening mammogram for malignant neoplasm of breast: Secondary | ICD-10-CM

## 2013-07-09 ENCOUNTER — Ambulatory Visit: Payer: Medicare Other

## 2013-07-24 DIAGNOSIS — N3946 Mixed incontinence: Secondary | ICD-10-CM | POA: Diagnosis not present

## 2013-07-24 DIAGNOSIS — R350 Frequency of micturition: Secondary | ICD-10-CM | POA: Diagnosis not present

## 2013-08-07 DIAGNOSIS — L940 Localized scleroderma [morphea]: Secondary | ICD-10-CM | POA: Diagnosis not present

## 2013-09-12 DIAGNOSIS — IMO0001 Reserved for inherently not codable concepts without codable children: Secondary | ICD-10-CM | POA: Diagnosis not present

## 2013-09-12 DIAGNOSIS — N318 Other neuromuscular dysfunction of bladder: Secondary | ICD-10-CM | POA: Diagnosis not present

## 2013-09-12 DIAGNOSIS — E785 Hyperlipidemia, unspecified: Secondary | ICD-10-CM | POA: Diagnosis not present

## 2013-09-12 DIAGNOSIS — I10 Essential (primary) hypertension: Secondary | ICD-10-CM | POA: Diagnosis not present

## 2013-09-12 DIAGNOSIS — G4733 Obstructive sleep apnea (adult) (pediatric): Secondary | ICD-10-CM | POA: Diagnosis not present

## 2013-09-12 DIAGNOSIS — R413 Other amnesia: Secondary | ICD-10-CM | POA: Diagnosis not present

## 2013-09-12 DIAGNOSIS — R634 Abnormal weight loss: Secondary | ICD-10-CM | POA: Diagnosis not present

## 2013-09-12 DIAGNOSIS — I2510 Atherosclerotic heart disease of native coronary artery without angina pectoris: Secondary | ICD-10-CM | POA: Diagnosis not present

## 2013-09-27 DIAGNOSIS — I25810 Atherosclerosis of coronary artery bypass graft(s) without angina pectoris: Secondary | ICD-10-CM | POA: Diagnosis not present

## 2013-09-27 DIAGNOSIS — I10 Essential (primary) hypertension: Secondary | ICD-10-CM | POA: Diagnosis not present

## 2013-09-27 DIAGNOSIS — E1159 Type 2 diabetes mellitus with other circulatory complications: Secondary | ICD-10-CM | POA: Diagnosis not present

## 2013-09-27 DIAGNOSIS — Z951 Presence of aortocoronary bypass graft: Secondary | ICD-10-CM | POA: Diagnosis not present

## 2013-10-09 ENCOUNTER — Encounter (INDEPENDENT_AMBULATORY_CARE_PROVIDER_SITE_OTHER): Payer: Self-pay

## 2013-10-09 ENCOUNTER — Encounter: Payer: Self-pay | Admitting: Neurology

## 2013-10-09 ENCOUNTER — Ambulatory Visit (INDEPENDENT_AMBULATORY_CARE_PROVIDER_SITE_OTHER): Payer: Medicare Other | Admitting: Neurology

## 2013-10-09 VITALS — BP 147/58 | HR 58 | Ht 62.0 in | Wt 156.0 lb

## 2013-10-09 DIAGNOSIS — F09 Unspecified mental disorder due to known physiological condition: Secondary | ICD-10-CM

## 2013-10-09 DIAGNOSIS — R4189 Other symptoms and signs involving cognitive functions and awareness: Secondary | ICD-10-CM

## 2013-10-09 MED ORDER — DONEPEZIL HCL 10 MG PO TABS: 10.0000 mg | ORAL_TABLET | Freq: Every day | ORAL | Status: DC

## 2013-10-09 NOTE — Patient Instructions (Signed)
Overall you are doing fairly well but I do want to suggest a few things today:   Remember to drink plenty of fluid, eat healthy meals and do not skip any meals. Try to eat protein with a every meal and eat a healthy snack such as fruit or nuts in between meals. Try to keep a regular sleep-wake schedule and try to exercise daily, particularly in the form of walking, 20-30 minutes a day, if you can.   As far as your medications are concerned, I would like to suggest the following: 1)Please increase your Aricept to 10mg  nightly. You can take two 5mg  tablets nightly to use it up 2)Please discuss tapering down your gabapentin and cymbalta with Dr Virgina Jock. This may help your balance.   Please follow up with Dr Jaynee Eagles in 4 months. Please call us with any interim questions, concerns, problems, updates or refill requests.   My clinical assistant and will answer any of your questions and relay your messages to me and also relay most of my messages to you.   Our phone number is [phone]. We also have an after hours call service for urgent matters and there is a physician on-call for urgent questions. For any emergencies you know to call 911 or go to the nearest emergency room

## 2013-10-09 NOTE — Progress Notes (Signed)
[facility]    Provider:  Dr Janann Colonel Referring Provider: Precious Reel, MD Primary Care Physician:  Precious Reel, MD  CC:  Cognitive decline  HPI:  TALINE OLAH is a 80 y.o. female here as a follow up from Dr. Virgina Jock for cognitive decline. Initial visit was 03/2013 at which time she had an unremarkable brain MRI and lab workup. She was started on Aricept 5mg  daily. Returns today with continued concerns over short term memory. Patient notes continued difficulty with getting lost, has a hard time getting around. She will have good and bad days, some days she is more confused and disoriented. Has gotten lost driving a few times. Her daughter notes some improvement since being on the medication. She feels that the patient is more alert and interactive. Notes some gait instability and dizziness but no falls.    Initial visit: Daughter notes symptoms have been ongoing for a few years and have gotten progressively worse. Daughter notices she will be telling a story and then just forget what comes next. Trouble with short term memory, difficulty recalling what they just talked about. Currently driving, denies any difficulty, no accidents. Has noted some episodes of getting lost. Wakes up frequently overnight with need to urinate. She notes one episode of a visual hallucination, described as a hazy shadow. Notes some difficulty with remote memory but overall feels that is still intact.   She currently lives alone, her daughter takes care of finances and medications, has been doing this for the past few years. Has been hospitalized a few times in the past with question of stroke, no stroke noted and daughter suspects patients may have been managing her medications incorrectly in the past.   Has history of DM, HLD, HTN. Has neuropathy for which she takes Cymbalta and Gabapentin. Has occasional episodes of dizziness which she takes meclizine for. Described as feeling light headed and  sensation of almost passing out. No known family history of cognitive decline. Has been told her B12 is low and has received injections in the past, currently taking oral B12 daily.   Review of Systems: Out of a complete 14 system review, the patient complains of only the following symptoms, and all other reviewed systems are negative. + memory decline, slurred speech  History   Social History  . Marital Status: Married    Spouse Name: N/A    Number of Children: N/A  . Years of Education: N/A   Occupational History  . Not on file.   Social History Main Topics  . Smoking status: Former Smoker -- 1.00 packs/day for 30 years    Types: Cigarettes    Quit date: 11/25/1986  . Smokeless tobacco: Not on file  . Alcohol Use: No  . Drug Use: No  . Sexual Activity: Yes    Birth Control/ Protection: Post-menopausal   Other Topics Concern  . Not on file   Social History Narrative   Married, has 1 child    Family History  Problem Relation Age of Onset  . Cancer Brother   . Heart disease Father   . Cancer Father     skin  . Coronary artery disease Mother   . Cancer - Colon Brother     brain, Lymphoma    Past Medical History  Diagnosis Date  . CAD (coronary artery disease)     [place]   80% stenosis ostial Left main, 30% stenosis proximal LAD, 80% stenosis proximal RCA CABG  -  LIMA to LAD, SVG to int, SVG to [place], [place]   . Hypertensive heart disease   . Type 2 diabetes mellitus with vascular disease   . Bullous pemphigus   . Diabetic peripheral neuropathy   . GERD (gastroesophageal reflux disease)   . Hyperlipidemia   . Obesity (BMI 30-39.9)   . Anginal pain     occ  . Hypertension   . Sleep apnea     dx 39yrs ago doesnt use cpap but uses O2 at nite 2L  . Heart murmur     mvp  . Stones in the urinary tract   . UTI (lower urinary tract infection)   . Headache(784.0)     hx migraines  . Nasal bleeding     occ due to O2  . Peripheral  neuropathy   . Osteopenia   . OA (osteoarthritis)   . Broken ankle   . Seizure     2009    Past Surgical History  Procedure Laterality Date  . Coronary artery bypass graft  96  . Cardiac catheterization    . Abdominal hysterectomy  70's  . Cholecystectomy    . Shoulder arthroscopy      rt  . Shoulder arthroscopy with rotator cuff repair and subacromial decompression  12/01/2011    Procedure: SHOULDER ARTHROSCOPY WITH ROTATOR CUFF REPAIR AND SUBACROMIAL DECOMPRESSION;  Surgeon: Nita Sells, MD;  Location: [facility];  Service: Orthopedics;  Laterality: Left;  shoulder debridement calcific tendonitis    Current Outpatient Prescriptions  Medication Sig Dispense Refill  . aspirin EC 325 MG tablet Take 325 mg by mouth daily.      Marland Kitchen donepezil (ARICEPT) 5 MG tablet Take 1 tablet (5 mg total) by mouth at bedtime.  30 tablet  6  . DULoxetine (CYMBALTA) 60 MG capsule Take 60 mg by mouth daily.      Marland Kitchen gabapentin (NEURONTIN) 600 MG tablet Take 600 mg by mouth 2 (two) times daily. Takes 1 1/2 tabs daily      . glucose blood test strip 1 each by Other route as needed for other. Use as instructed      . hydrOXYzine (ATARAX/VISTARIL) 25 MG tablet Take 25 mg by mouth 2 (two) times daily as needed. For itching      . Insulin Glargine (LANTUS SOLOSTAR) 100 UNIT/ML Solostar Pen Inject 17 Units into the skin daily at 10 pm.       . Insulin Pen Needle (B-D UF III MINI PEN NEEDLES) 31G X 5 MM MISC by Does not apply route.      Marland Kitchen losartan (COZAAR) 50 MG tablet Take 50 mg by mouth 2 (two) times daily.      . meclizine (ANTIVERT) 25 MG tablet Take 25 mg by mouth 3 (three) times daily as needed for dizziness.      . metoprolol succinate (TOPROL-XL) 25 MG 24 hr tablet Take 12.5 mg by mouth 2 (two) times daily.      . mirabegron ER (MYRBETRIQ) 50 MG TB24 tablet Take 50 mg by mouth daily.      . pantoprazole (PROTONIX) 40 MG tablet Take 40 mg by mouth daily.      . rosuvastatin (CRESTOR) 10 MG tablet  Take 10 mg by mouth daily.      . sitaGLIPtin (JANUVIA) 100 MG tablet Take 100 mg by mouth daily.      Marland Kitchen amitriptyline (ELAVIL) 25 MG tablet Take 25 mg by mouth at bedtime.      Marland Kitchen  BIOTIN PO Take 1 capsule by mouth daily.      Mariane Baumgarten Calcium (STOOL SOFTENER PO) Take 1 tablet by mouth daily.      . mometasone (NASONEX) 50 MCG/ACT nasal spray Place 2 sprays into the nose daily as needed. For congestion      . solifenacin (VESICARE) 5 MG tablet Take 5 mg by mouth daily.      . vitamin B-12 (CYANOCOBALAMIN) 1000 MCG tablet Take 1,000 mcg by mouth daily.       No current facility-administered medications for this visit.    Allergies as of 10/09/2013 - Review Complete 10/09/2013  Allergen Reaction Noted  . Percocet [oxycodone-acetaminophen] Nausea And Vomiting 09/30/2011  . Latex Itching and Rash 11/22/2011    Vitals: BP 147/58  Pulse 58  Ht 5\' 2"  (1.575 m)  Wt 156 lb (70.761 kg)  BMI 28.53 kg/m2 Last Weight:  Wt Readings from Last 1 Encounters:  10/09/13 156 lb (70.761 kg)   Last Height:   Ht Readings from Last 1 Encounters:  10/09/13 5\' 2"  (1.575 m)     Physical exam: Exam: Gen: NAD, conversant Eyes: anicteric sclerae, moist conjunctivae HENT: Atraumatic, oropharynx clear Neck: Trachea midline; supple,  Lungs: CTA, no wheezing, rales, rhonic                          CV: RRR, no MRG Abdomen: Soft, non-tender;  Extremities: No peripheral edema  Skin: Normal temperature, no rash,  Psych: Appropriate affect, pleasant  Neuro: MS:  MOCA 23/30 improved from 17/30 at prior visit   CN: PERRL, EOMI no nystagmus, no ptosis, sensation intact to LT V1-V3 bilat, face symmetric, no weakness, hearing grossly intact, palate elevates symmetrically, shoulder shrug 5/5 bilat,  tongue protrudes midline, no fasiculations noted.  Motor: normal bulk and tone Strength: 5/5  In all extremities  Coord: rapid alternating and point-to-point (FNF, HTS) movements intact.  Reflexes:  diminished patellar, absent achilles, bilat downgoing toes  Sens: decreased LT bilateral LE to mid shin  Gait: stands slowly without assistance, unable to tandem. Romberg absent.   Assessment:  After physical and neurologic examination, review of laboratory studies, imaging, neurophysiology testing and pre-existing records, assessment will be reviewed on the problem list.  Plan:  Treatment plan and additional workup will be reviewed under Problem List.  1)cognitive decline 2)peripheral neuropathy  80y/o woman presenting for follow up evaluation of cognitive decline. Patient and daughter note it is predominantly a problem with short term memory and they feel it has been getting progressively worse over time. Daughter does note some improvement since starting Aricept 5mg . MRI brain and lab workup unremarkable. Will increase Aricept to 10mg  nightly. She will follow up with Dr Virgina Jock to discuss tapering down her gabapentin and cymbalta to see if this improves her dizziness. Follow up in 4 months with Dr Jaynee Eagles.    Jim Like, DO  [facility] [address] [place], [place] [address]  Phone [phone] Fax [phone]

## 2013-10-10 DIAGNOSIS — N3946 Mixed incontinence: Secondary | ICD-10-CM | POA: Diagnosis not present

## 2013-10-10 DIAGNOSIS — N3020 Other chronic cystitis without hematuria: Secondary | ICD-10-CM | POA: Diagnosis not present

## 2013-10-29 DIAGNOSIS — R0989 Other specified symptoms and signs involving the circulatory and respiratory systems: Secondary | ICD-10-CM | POA: Diagnosis not present

## 2013-10-29 DIAGNOSIS — I6523 Occlusion and stenosis of bilateral carotid arteries: Secondary | ICD-10-CM | POA: Diagnosis not present

## 2013-10-31 DIAGNOSIS — J3489 Other specified disorders of nose and nasal sinuses: Secondary | ICD-10-CM | POA: Diagnosis not present

## 2013-11-08 DIAGNOSIS — E780 Pure hypercholesterolemia: Secondary | ICD-10-CM | POA: Diagnosis not present

## 2013-11-08 DIAGNOSIS — I2510 Atherosclerotic heart disease of native coronary artery without angina pectoris: Secondary | ICD-10-CM | POA: Diagnosis not present

## 2013-11-08 DIAGNOSIS — G4733 Obstructive sleep apnea (adult) (pediatric): Secondary | ICD-10-CM | POA: Diagnosis not present

## 2013-11-08 DIAGNOSIS — I951 Orthostatic hypotension: Secondary | ICD-10-CM | POA: Diagnosis not present

## 2013-11-11 ENCOUNTER — Telehealth: Payer: Self-pay | Admitting: Neurology

## 2013-11-11 DIAGNOSIS — N3946 Mixed incontinence: Secondary | ICD-10-CM | POA: Diagnosis not present

## 2013-11-11 NOTE — Telephone Encounter (Signed)
Patient daughter, Adela Lank requesting Rx refill for donepezil (ARICEPT) 10 MG tablet.  Patient completely out of medication.  Please call and advise.

## 2013-11-11 NOTE — Telephone Encounter (Signed)
Dr Janann Colonel Josem Kaufmann 4 refills on this med 09/30.  I called the pharmacy.  Spoke with pharmacist who said the refills were saved on file.  They will process Rx today.  I called Shelia back, got no answer.  Left message.

## 2014-01-16 DIAGNOSIS — E785 Hyperlipidemia, unspecified: Secondary | ICD-10-CM | POA: Diagnosis not present

## 2014-01-16 DIAGNOSIS — Z6829 Body mass index (BMI) 29.0-29.9, adult: Secondary | ICD-10-CM | POA: Diagnosis not present

## 2014-01-16 DIAGNOSIS — R413 Other amnesia: Secondary | ICD-10-CM | POA: Diagnosis not present

## 2014-01-16 DIAGNOSIS — I6529 Occlusion and stenosis of unspecified carotid artery: Secondary | ICD-10-CM | POA: Diagnosis not present

## 2014-01-16 DIAGNOSIS — J310 Chronic rhinitis: Secondary | ICD-10-CM | POA: Diagnosis not present

## 2014-01-16 DIAGNOSIS — E1165 Type 2 diabetes mellitus with hyperglycemia: Secondary | ICD-10-CM | POA: Diagnosis not present

## 2014-01-16 DIAGNOSIS — E119 Type 2 diabetes mellitus without complications: Secondary | ICD-10-CM | POA: Diagnosis not present

## 2014-01-16 DIAGNOSIS — E538 Deficiency of other specified B group vitamins: Secondary | ICD-10-CM | POA: Diagnosis not present

## 2014-01-16 DIAGNOSIS — G629 Polyneuropathy, unspecified: Secondary | ICD-10-CM | POA: Diagnosis not present

## 2014-01-16 DIAGNOSIS — Z23 Encounter for immunization: Secondary | ICD-10-CM | POA: Diagnosis not present

## 2014-01-16 DIAGNOSIS — I10 Essential (primary) hypertension: Secondary | ICD-10-CM | POA: Diagnosis not present

## 2014-01-16 DIAGNOSIS — N3281 Overactive bladder: Secondary | ICD-10-CM | POA: Diagnosis not present

## 2014-02-03 DIAGNOSIS — Z6828 Body mass index (BMI) 28.0-28.9, adult: Secondary | ICD-10-CM | POA: Diagnosis not present

## 2014-02-03 DIAGNOSIS — R05 Cough: Secondary | ICD-10-CM | POA: Diagnosis not present

## 2014-02-03 DIAGNOSIS — I10 Essential (primary) hypertension: Secondary | ICD-10-CM | POA: Diagnosis not present

## 2014-02-03 DIAGNOSIS — R509 Fever, unspecified: Secondary | ICD-10-CM | POA: Diagnosis not present

## 2014-02-03 DIAGNOSIS — E1149 Type 2 diabetes mellitus with other diabetic neurological complication: Secondary | ICD-10-CM | POA: Diagnosis not present

## 2014-02-03 DIAGNOSIS — J209 Acute bronchitis, unspecified: Secondary | ICD-10-CM | POA: Diagnosis not present

## 2014-02-10 ENCOUNTER — Ambulatory Visit: Payer: Medicare Other | Admitting: Neurology

## 2014-02-19 ENCOUNTER — Encounter: Payer: Self-pay | Admitting: Neurology

## 2014-02-19 ENCOUNTER — Ambulatory Visit (INDEPENDENT_AMBULATORY_CARE_PROVIDER_SITE_OTHER): Payer: Medicare Other | Admitting: Neurology

## 2014-02-19 VITALS — BP 163/59 | HR 63 | Ht 61.0 in | Wt 147.0 lb

## 2014-02-19 DIAGNOSIS — G609 Hereditary and idiopathic neuropathy, unspecified: Secondary | ICD-10-CM

## 2014-02-19 DIAGNOSIS — G3184 Mild cognitive impairment, so stated: Secondary | ICD-10-CM | POA: Insufficient documentation

## 2014-02-19 DIAGNOSIS — R4189 Other symptoms and signs involving cognitive functions and awareness: Secondary | ICD-10-CM

## 2014-02-19 MED ORDER — DONEPEZIL HCL 10 MG PO TABS: 10.0000 mg | ORAL_TABLET | Freq: Every day | ORAL | Status: DC

## 2014-02-19 MED ORDER — DULOXETINE HCL 30 MG PO CPEP: 30.0000 mg | ORAL_CAPSULE | Freq: Every day | ORAL | Status: DC

## 2014-02-19 NOTE — Progress Notes (Signed)
[id number]    Provider:  Dr Jaynee Eagles Referring Provider: Precious Reel, MD Primary Care Physician:  Precious Reel, MD  CC:  Memory loss  HPI:  Linda Oliver is a 81 y.o. female here as a follow up. She is a former patient of Dr. Janann Colonel. Has history of DM, HLD, HTN. Has neuropathy for which she takes Cymbalta and Gabapentin. Has occasional episodes of dizziness which she takes meclizine for. No known family history of cognitive decline. Has been told her B12 is low and has received injections in the past, currently taking oral B12 daily. She feels great, feels Aricept is making a significant difference. No side effects from the medication. She has decreased her Neurontin since starting Cymbalta and would like to try and decrease further.   10/09/2013 visit: Linda Oliver is a 81 y.o. female here as a follow up from Dr. Virgina Jock for cognitive decline. Initial visit was 03/2013 at which time she had an unremarkable brain MRI and lab workup. She was started on Aricept 5mg  daily. Returns today with continued concerns over short term memory. Patient notes continued difficulty with getting lost, has a hard time getting around. She will have good and bad days, some days she is more confused and disoriented. Has gotten lost driving a few times. Her daughter notes some improvement since being on the medication. She feels that the patient is more alert and interactive. Notes some gait instability and dizziness but no falls.   Initial visit 04/09/2013: Daughter notes symptoms have been ongoing for a few years and have gotten progressively worse. Daughter notices she will be telling a story and then just forget what comes next. Trouble with short term memory, difficulty recalling what they just talked about. Currently driving, denies any difficulty, no accidents. Has noted some episodes of getting lost. Wakes up frequently overnight with need to urinate. She notes one episode of a visual hallucination,  described as a hazy shadow. Notes some difficulty with remote memory but overall feels that is still intact.   She currently lives alone, her daughter takes care of finances and medications, has been doing this for the past few years. Has been hospitalized a few times in the past with question of stroke, no stroke noted and daughter suspects patients may have been managing her medications incorrectly in the past.     Review of Systems: Patient complains of symptoms per HPI as well as the following symptoms: cold and heat intolerance, excessive thirst, SOB, cough, ear pain, runny nose, incontinence of bladder. Pertinent negatives per HPI. All others negative.   History   Social History  . Marital Status: Married    Spouse Name: N/A  . Number of Children: N/A  . Years of Education: N/A   Occupational History  . Not on file.   Social History Main Topics  . Smoking status: Former Smoker -- 1.00 packs/day for 30 years    Types: Cigarettes    Quit date: 11/25/1986  . Smokeless tobacco: Not on file  . Alcohol Use: No  . Drug Use: No  . Sexual Activity: Yes    Birth Control/ Protection: Post-menopausal   Other Topics Concern  . Not on file   Social History Narrative   Married, has 1 child    Family History  Problem Relation Age of Onset  . Cancer Brother   . Heart disease Father   . Cancer Father     skin  . Coronary artery disease Mother   .  Cancer - Colon Brother     brain, Lymphoma    Past Medical History  Diagnosis Date  . CAD (coronary artery disease)     [place]   80% stenosis ostial Left main, 30% stenosis proximal LAD, 80% stenosis proximal RCA CABG  -  LIMA to LAD, SVG to int, SVG to [place], [place]   . Hypertensive heart disease   . Type 2 diabetes mellitus with vascular disease   . Bullous pemphigus   . Diabetic peripheral neuropathy   . GERD (gastroesophageal reflux disease)   . Hyperlipidemia   . Obesity (BMI 30-39.9)   .  Anginal pain     occ  . Hypertension   . Sleep apnea     dx 56yrs ago doesnt use cpap but uses O2 at nite 2L  . Heart murmur     mvp  . Stones in the urinary tract   . UTI (lower urinary tract infection)   . Headache(784.0)     hx migraines  . Nasal bleeding     occ due to O2  . Peripheral neuropathy   . Osteopenia   . OA (osteoarthritis)   . Broken ankle   . Seizure     2009    Past Surgical History  Procedure Laterality Date  . Coronary artery bypass graft  96  . Cardiac catheterization    . Abdominal hysterectomy  70's  . Cholecystectomy    . Shoulder arthroscopy      rt  . Shoulder arthroscopy with rotator cuff repair and subacromial decompression  12/01/2011    Procedure: SHOULDER ARTHROSCOPY WITH ROTATOR CUFF REPAIR AND SUBACROMIAL DECOMPRESSION;  Surgeon: Nita Sells, MD;  Location: [facility];  Service: Orthopedics;  Laterality: Left;  shoulder debridement calcific tendonitis    Current Outpatient Prescriptions  Medication Sig Dispense Refill  . aspirin EC 325 MG tablet Take 325 mg by mouth daily.    . busPIRone (BUSPAR) 5 MG tablet Take 5 mg by mouth 2 (two) times daily.    Marland Kitchen donepezil (ARICEPT) 10 MG tablet Take 1 tablet (10 mg total) by mouth at bedtime. 90 tablet 3  . DULoxetine (CYMBALTA) 60 MG capsule Take 60 mg by mouth daily.    . fesoterodine (TOVIAZ) 8 MG TB24 tablet Take 8 mg by mouth daily.    Marland Kitchen gabapentin (NEURONTIN) 600 MG tablet Take 600 mg by mouth 2 (two) times daily. Takes 1 1/2 tabs daily    . glucose blood test strip 1 each by Other route as needed for other. Use as instructed    . hydrOXYzine (ATARAX/VISTARIL) 25 MG tablet Take 25 mg by mouth 2 (two) times daily as needed. For itching    . Insulin Glargine (LANTUS SOLOSTAR) 100 UNIT/ML Solostar Pen Inject 17 Units into the skin daily at 10 pm.     . Insulin Pen Needle (B-D UF III MINI PEN NEEDLES) 31G X 5 MM MISC by Does not apply route.    Marland Kitchen losartan (COZAAR) 50 MG tablet Take 50 mg by  mouth 2 (two) times daily.    . meclizine (ANTIVERT) 25 MG tablet Take 25 mg by mouth 3 (three) times daily as needed for dizziness.    . metoprolol succinate (TOPROL-XL) 25 MG 24 hr tablet Take 12.5 mg by mouth 2 (two) times daily.    . mometasone (NASONEX) 50 MCG/ACT nasal spray Place 2 sprays into the nose daily as needed. For congestion    .  pantoprazole (PROTONIX) 40 MG tablet Take 40 mg by mouth daily.    . rosuvastatin (CRESTOR) 10 MG tablet Take 10 mg by mouth daily.    . sitaGLIPtin (JANUVIA) 100 MG tablet Take 100 mg by mouth daily.    . vitamin B-12 (CYANOCOBALAMIN) 1000 MCG tablet Take 1,000 mcg by mouth daily.    Marland Kitchen amitriptyline (ELAVIL) 25 MG tablet Take 25 mg by mouth at bedtime.    Marland Kitchen BIOTIN PO Take 1 capsule by mouth daily.    Mariane Baumgarten Calcium (STOOL SOFTENER PO) Take 1 tablet by mouth daily.    . DULoxetine (CYMBALTA) 30 MG capsule Take 1 capsule (30 mg total) by mouth daily. 30 capsule 6  . mirabegron ER (MYRBETRIQ) 50 MG TB24 tablet Take 50 mg by mouth daily.    . solifenacin (VESICARE) 5 MG tablet Take 5 mg by mouth daily.     No current facility-administered medications for this visit.    Allergies as of 02/19/2014 - Review Complete 02/19/2014  Allergen Reaction Noted  . Percocet [oxycodone-acetaminophen] Nausea And Vomiting 09/30/2011  . Latex Itching and Rash 11/22/2011    Vitals: BP 163/59 mmHg  Pulse 63  Ht 5\' 1"  (1.549 m)  Wt 147 lb (66.679 kg)  BMI 27.79 kg/m2 Last Weight:  Wt Readings from Last 1 Encounters:  02/19/14 147 lb (66.679 kg)   Last Height:   Ht Readings from Last 1 Encounters:  02/19/14 5\' 1"  (1.549 m)    Neuro: Detailed Neurologic Exam  Speech:    Speech is normal; fluent and spontaneous with normal comprehension.  Cognition:MS:  MOCA 24/30 improved from 17/30 at initial visit Cranial Nerves:    The pupils are equal, round, and reactive to light. Visual fields are full to finger confrontation. Extraocular movements are intact.  Trigeminal sensation is intact and the muscles of mastication are normal. The face is symmetric. The palate elevates in the midline. Hearing intact. Voice is normal. Shoulder shrug is normal. The tongue has normal motion without fasciculations.   Motor Observation:    no involuntary movements noted. Tone:    Normal muscle tone.    Posture:    Posture is normal. normal erect    Strength:    Strength is V/V in the upper and lower limbs.        Assessment/Plan:  80y/o woman presenting for follow up evaluation of cognitive decline. Patient and daughter note it is predominantly a problem with short term memory and they feel it has been getting progressively better with Aricept. MRI brain and lab workup unremarkable.   As far as your medications are concerned, I would like to suggest: Continue Aricept 10mg  daily. Can consider Namzaric in place of Aricept however her improvement from MoCA 17/30 to 24/30 may show namenda not necessary currently. Can try Cymbalta 30mg  daily instead of the 60mg .    Sarina Ill, MD  [facility] [address] [place], [place] [address]  Phone [phone] Fax [phone]  A total of 15 minutes was spent face-to-face with this patient. Over half this time was spent on counseling patient on the neuropathy and cognitive decline diagnosis and different diagnostic and therapeutic options available.

## 2014-02-19 NOTE — Patient Instructions (Signed)
Overall you are doing fairly well but I do want to suggest a few things today:   Remember to drink plenty of fluid, eat healthy meals and do not skip any meals. Try to eat protein with a every meal and eat a healthy snack such as fruit or nuts in between meals. Try to keep a regular sleep-wake schedule and try to exercise daily, particularly in the form of walking, 20-30 minutes a day, if you can.   As far as your medications are concerned, I would like to suggest: Continue Aricept 10mg  daily. Can consider Namzaric in place of Aricept. Can try Cymbalta 30mg  daily instead of the 60mg .   I would like to see you back in 6 months, sooner if we need to. Please call us with any interim questions, concerns, problems, updates or refill requests.   Please also call us for any test results so we can go over those with you on the phone.  My clinical assistant and will answer any of your questions and relay your messages to me and also relay most of my messages to you.   Our phone number is [phone]. We also have an after hours call service for urgent matters and there is a physician on-call for urgent questions. For any emergencies you know to call 911 or go to the nearest emergency room

## 2014-03-04 DIAGNOSIS — R270 Ataxia, unspecified: Secondary | ICD-10-CM | POA: Diagnosis not present

## 2014-03-18 DIAGNOSIS — H35371 Puckering of macula, right eye: Secondary | ICD-10-CM | POA: Diagnosis not present

## 2014-03-18 DIAGNOSIS — H35342 Macular cyst, hole, or pseudohole, left eye: Secondary | ICD-10-CM | POA: Diagnosis not present

## 2014-03-18 DIAGNOSIS — E119 Type 2 diabetes mellitus without complications: Secondary | ICD-10-CM | POA: Diagnosis not present

## 2014-03-18 DIAGNOSIS — Z9889 Other specified postprocedural states: Secondary | ICD-10-CM | POA: Diagnosis not present

## 2014-03-19 ENCOUNTER — Encounter: Payer: Self-pay | Admitting: Physical Therapy

## 2014-03-19 ENCOUNTER — Ambulatory Visit: Payer: Medicare Other | Attending: Otolaryngology | Admitting: Physical Therapy

## 2014-03-19 DIAGNOSIS — R42 Dizziness and giddiness: Secondary | ICD-10-CM | POA: Insufficient documentation

## 2014-03-19 DIAGNOSIS — R269 Unspecified abnormalities of gait and mobility: Secondary | ICD-10-CM | POA: Diagnosis not present

## 2014-03-19 NOTE — Therapy (Signed)
[facility] [address] [place], [place], [address] Phone: [phone]   Fax:  [phone]  Physical Therapy Evaluation  Patient Details  Name: Linda Oliver MRN: [id number] Date of Birth: 05-09-1932 Referring Provider:  Shon Baton, MD  Encounter Date: 03/19/2014      PT End of Session - 03/19/14 1750    Visit Number 1  G1   Number of Visits 17   Date for PT Re-Evaluation 05/18/14  STG's due 04-18-14   Authorization Type Medicare   Authorization Time Period 03-19-14 - 05-18-14   PT Start Time 1105   PT Stop Time 1149   PT Time Calculation (min) 44 min      Past Medical History  Diagnosis Date  . CAD (coronary artery disease)     [place]   80% stenosis ostial Left main, 30% stenosis proximal LAD, 80% stenosis proximal RCA CABG  -  LIMA to LAD, SVG to int, SVG to [place], [place]   . Hypertensive heart disease   . Type 2 diabetes mellitus with vascular disease   . Bullous pemphigus   . Diabetic peripheral neuropathy   . GERD (gastroesophageal reflux disease)   . Hyperlipidemia   . Obesity (BMI 30-39.9)   . Anginal pain     occ  . Hypertension   . Sleep apnea     dx 39yrs ago doesnt use cpap but uses O2 at nite 2L  . Heart murmur     mvp  . Stones in the urinary tract   . UTI (lower urinary tract infection)   . Headache(784.0)     hx migraines  . Nasal bleeding     occ due to O2  . Peripheral neuropathy   . Osteopenia   . OA (osteoarthritis)   . Broken ankle   . Seizure     2009    Past Surgical History  Procedure Laterality Date  . Coronary artery bypass graft  96  . Cardiac catheterization    . Abdominal hysterectomy  70's  . Cholecystectomy    . Shoulder arthroscopy      rt  . Shoulder arthroscopy with rotator cuff repair and subacromial decompression  12/01/2011    Procedure: SHOULDER ARTHROSCOPY WITH ROTATOR CUFF REPAIR AND SUBACROMIAL DECOMPRESSION;  Surgeon: Nita Sells, MD;  Location: [facility];  Service: Orthopedics;  Laterality: Left;  shoulder debridement calcific tendonitis    There were no vitals taken for this visit.  Visit Diagnosis:  Abnormality of gait - Plan: PT plan of care cert/re-cert  Dizziness and giddiness - Plan: PT plan of care cert/re-cert      Subjective Assessment - 03/19/14 1119    Symptoms Pt. reports she feels dizziness in her head when sitting down; dizziness is worse with walking; has had trouble with walking and balance for past 1-2 months; pt. rates dizziness 10/10 intensity    Pertinent History Peripheral neuropathy; Diabetes; HTN; Alzheimer's   Patient Stated Goals Improve balance and reduce the vertigo   Currently in Pain? No/denies          [facility] PT Assessment - 03/19/14 0001    Assessment   Medical Diagnosis Ataxia; dizziness   Onset Date --  Jan. 2016   Prior Therapy 2014   Balance Screen   Has the patient fallen in the past 6 months Yes   How many times? 20   Has the patient had a decrease in activity level  because of a fear of falling?  Yes   Is the patient reluctant to leave their home because of a fear of falling?  Yes   [place] Private residence   [place] entrance   [place] One level   Prior Function   Level of Independence Independent with homemaking with ambulation   Ambulation/Gait   Ambulation/Gait Yes   Ambulation/Gait Assistance 5: Supervision   Ambulation/Gait Assistance Details unsteady gait with use of straight cane   Ambulation Distance (Feet) 120 Feet   Assistive device Straight cane   Gait Pattern Trunk flexed;Wide base of support   Ambulation Surface Level   Gait velocity 1.62  with cane   Timed Up and Go Test   Normal TUG (seconds) 21.84   Functional Gait  Assessment   Gait assessed  --      Positional testing - R and L Dix-Hallpike tests were performed with no nystagmus observed; pt did c/o funny feeling in her  head In test positions and c/o dizziness with return to upright but no nystagmus observed in any position                    PT Education - 03/19/14 1747    Education provided Yes   Education Details Recommended to pt that she walk as much as possible with use of RW rather than cane in order to reduce fall risk; also recommended that she stand without UE support but near counter and turn head side to side to stimulate vestbiular system   Person(s) Educated Other (comment);Patient  friend present during eval   Methods Explanation   Comprehension Verbalized understanding          PT Short Term Goals - 03/19/14 1805    PT SHORT TERM GOAL #1   Title Improve TUG with cane to </= 17.5 secs to decr. fall risk (target date 04-18-14)   Baseline 21.84 secs with cane   Time 4   Period Weeks   Status New   PT SHORT TERM GOAL #2   Title Improve Berg balance test score by at least 4 points for reduced fall risk.  (04-18-14)   Baseline TBA next visit   Time 4   Period Weeks   Status New   PT SHORT TERM GOAL #3   Title Incr. gait velocity to >/= 1.9 ft/sec with cane for incr. gait efficiency. (04-18-14)   Baseline 1.62 ft/sec wtih cane   Time 4   Period Weeks   Status New   PT SHORT TERM GOAL #4   Title Independent in HEP for balance and vestibular exercises.  (04-18-14)   Time 4   Period Weeks   Status New           PT Long Term Goals - 03/19/14 1813    PT LONG TERM GOAL #1   Title Amb. 360' with cane with SBA without LOB on flat, even surface for incr. community accessibility.  (05-18-14)   Time 8   Period Weeks   Status New   PT LONG TERM GOAL #2   Title Report at least 25% improvement in vertigo for safety with mobility.  (05-18-14)   Time 8   Period Weeks   Status New   PT LONG TERM GOAL #3   Title Improve TUG score to </= 14.5 secs with cane.  (05-18-14)   Baseline 21.84 secs with cane   Time 8   Period Weeks  Status New   PT LONG TERM GOAL #4   Title Incr.  gait velocity to >/= 2.1 ft/sec with cane for incr. gait efficiency.  (target date 05-18-14)   Baseline 1.62 ft/sec with cane   Time 8   Period Weeks   Status New               Plan - 03/31/14 1751    Clinical Impression Statement Pt. presents with c/o dizziness that appears to be multi-factorial; some incr. c/o vertigo with return to upright position but no nystagmus noted during any positional testing that would indicate BPPV; pt. also has unsteady gait pattern and would benfit from use of RW rahter than cane to reduce fall risk   Pt will benefit from skilled therapeutic intervention in order to improve on the following deficits Abnormal gait;Decreased safety awareness;Impaired sensation;Decreased activity tolerance;Decreased balance;Decreased cognition;Decreased mobility;Decreased strength;Other (comment)  possibly impaired vision - has eye appt. scheduled 04-03-14; vestibular deficits   Rehab Potential Good   PT Frequency 2x / week   PT Duration 8 weeks   PT Treatment/Interventions ADLs/Self Care Home Management;Therapeutic activities;Patient/family education;Therapeutic exercise;Gait training;Balance training;Stair training;Neuromuscular re-education;Functional mobility training   PT Next Visit Plan complete Berg balance test; begin HEP for balance and standing on floor with EC and with head turns; further assess dizziness as time permits   PT Home Exercise Plan balance HEP   Consulted and Agree with Plan of Care Patient;Other (Comment)  friend accompanying pt to eval          G-Codes - 03-31-2014 1815    Functional Limitation Mobility: Walking and moving around   Mobility: Walking and Moving Around Current Status [phone]) At least 60 percent but less than 80 percent impaired, limited or restricted   Mobility: Walking and Moving Around Goal Status [phone]) At least 40 percent but less than 60 percent impaired, limited or restricted       Problem List Patient Active Problem List    Diagnosis Date Noted  . Hereditary and idiopathic peripheral neuropathy 02/19/2014  . Cognitive decline 02/19/2014  . CAD (coronary artery disease)   . Type 2 diabetes mellitus with vascular disease   . Hyperlipidemia   . Hypertensive heart disease   . Obesity (BMI 30-39.9)   . GERD (gastroesophageal reflux disease)   . Bullous pemphigus   . Diabetic peripheral neuropathy     Ronell Duffus, [place], PT 2014-03-31, 6:20 PM  [facility] [address] [place], [place], [address] Phone: [phone]   Fax:  [phone]

## 2014-03-24 ENCOUNTER — Ambulatory Visit: Payer: Medicare Other

## 2014-03-24 DIAGNOSIS — R42 Dizziness and giddiness: Secondary | ICD-10-CM | POA: Diagnosis not present

## 2014-03-24 DIAGNOSIS — R269 Unspecified abnormalities of gait and mobility: Secondary | ICD-10-CM | POA: Diagnosis not present

## 2014-03-24 NOTE — Patient Instructions (Signed)
Feet Apart, Head Motion - Eyes Open   With eyes open, feet apart, move head slowly: side to side. Repeat __10__ times per session. Do __3__ sessions per day. For safety stand with your back in a corner with chair back in front of you.Copyright  VHI. All rights reserved.  Feet Together, Varied Arm Positions - Eyes Open   With eyes open, feet together, at your sides, look straight ahead at a stationary object. Stand with back to a corner and chair back in front of you.  Hold _30___ seconds. Repeat _2__ times per session. Do _3___ sessions per day.  Copyright  VHI. All rights reserved.

## 2014-03-24 NOTE — Therapy (Signed)
[facility] [address] [place], [place], [address] Phone: [phone]   Fax:  [phone]  Physical Therapy Treatment  Patient Details  Name: MIRKA UPRIGHT MRN: [id number] Date of Birth: [birth year] Referring Provider:  Shon Baton, MD  Encounter Date: 03/24/2014      PT End of Session - 03/24/14 1148    Visit Number 2   Number of Visits 17   Date for PT Re-Evaluation 05/18/14   Authorization Type Medicare   Authorization Time Period 03-19-14 - 05-18-14   PT Start Time 1105   PT Stop Time 1148   PT Time Calculation (min) 43 min   Activity Tolerance Patient tolerated treatment well   Behavior During Therapy [facility] for tasks assessed/performed      Past Medical History  Diagnosis Date  . CAD (coronary artery disease)     [place]   80% stenosis ostial Left main, 30% stenosis proximal LAD, 80% stenosis proximal RCA CABG  -  LIMA to LAD, SVG to int, SVG to [place], [place]   . Hypertensive heart disease   . Type 2 diabetes mellitus with vascular disease   . Bullous pemphigus   . Diabetic peripheral neuropathy   . GERD (gastroesophageal reflux disease)   . Hyperlipidemia   . Obesity (BMI 30-39.9)   . Anginal pain     occ  . Hypertension   . Sleep apnea     dx 22yrs ago doesnt use cpap but uses O2 at nite 2L  . Heart murmur     mvp  . Stones in the urinary tract   . UTI (lower urinary tract infection)   . Headache(784.0)     hx migraines  . Nasal bleeding     occ due to O2  . Peripheral neuropathy   . Osteopenia   . OA (osteoarthritis)   . Broken ankle   . Seizure     2009    Past Surgical History  Procedure Laterality Date  . Coronary artery bypass graft  96  . Cardiac catheterization    . Abdominal hysterectomy  70's  . Cholecystectomy    . Shoulder arthroscopy      rt  . Shoulder arthroscopy with rotator cuff repair and subacromial decompression  12/01/2011    Procedure:  SHOULDER ARTHROSCOPY WITH ROTATOR CUFF REPAIR AND SUBACROMIAL DECOMPRESSION;  Surgeon: Nita Sells, MD;  Location: [facility];  Service: Orthopedics;  Laterality: Left;  shoulder debridement calcific tendonitis    There were no vitals filed for this visit.  Visit Diagnosis:  Abnormality of gait  Dizziness and giddiness      Subjective Assessment - 03/24/14 1108    Symptoms I have a walking path at home I can do without cane or walker since it is in my environment and I can do it safely.  I have a life alert button in my pocket.  No falls recent.  I am always dizzy.  It is about a 7 today.  Feel light headed.   Currently in Pain? No/denies                Vestibular Assessment - 03/24/14 1651    Occulomotor Exam   Occulomotor Alignment Normal   Head shaking Horizontal Absent  with c/o dizziness   Head Shaking Vertical Absent   Smooth Pursuits Intact   Saccades Intact   Comment difficulty with keeping track of target during smooth pursuits especially with inferior and  left lateral movement, but no difficulty with gaze stability with VOR   Vestibulo-Occular Reflex   VOR 1 Head Only (x 1 viewing) WNL no c/o dizziness               [facility] Adult PT Treatment/Exercise - 03/24/14 1111    Standardized Balance Assessment   Standardized Balance Assessment Berg Balance Test   Berg Balance Test   Sit to Stand Able to stand without using hands and stabilize independently   Standing Unsupported Able to stand safely 2 minutes   Sitting with Back Unsupported but Feet Supported on Floor or Stool Able to sit safely and securely 2 minutes   Stand to Sit Sits safely with minimal use of hands   Transfers Able to transfer safely, minor use of hands   Standing Unsupported with Eyes Closed Able to stand 10 seconds with supervision   Standing Ubsupported with Feet Together Needs help to attain position but able to stand for 30 seconds with feet together   From Standing, Reach  Forward with Outstretched Arm Can reach forward >12 cm safely (5")   From Standing Position, Pick up Object from Floor Able to pick up shoe safely and easily   From Standing Position, Turn to Look Behind Over each Shoulder Looks behind from both sides and weight shifts well   Turn 360 Degrees Able to turn 360 degrees safely but slowly   Standing Unsupported, Alternately Place Feet on Step/Stool Able to complete >2 steps/needs minimal assist   Standing Unsupported, One Foot in Front Able to plae foot ahead of the other independently and hold 30 seconds   Standing on One Leg Unable to try or needs assist to prevent fall   Total Score 41     Treatment Patient performed standing balance in corner feet together eyes on stationary target 30 seconds x 2. Performed standing feet apart head turns x 10 x 2 cues to increase speed to provoke dizziness up to 5-6/10. Both issued to HEP with safety precautions to perform near corner with chair back in front.           PT Education - 03/24/14 1147    Education provided Yes   Education Details HEP for balance.   Person(s) Educated Patient   Methods Explanation;Handout;Demonstration   Comprehension Verbalized understanding;Need further instruction          PT Short Term Goals - 03/24/14 1657    PT SHORT TERM GOAL #1   Title Improve TUG with cane to </= 17.5 secs to decr. fall risk (target date 04-18-14)   Baseline 21.84 secs with cane   Time 4   Period Weeks   Status On-going   PT SHORT TERM GOAL #2   Title Improve Berg balance test score by at least 4 points for reduced fall risk.  (04-18-14)   Baseline TBA next visit   Time 4   Period Weeks   Status On-going   PT SHORT TERM GOAL #3   Title Incr. gait velocity to >/= 1.9 ft/sec with cane for incr. gait efficiency. (04-18-14)   Baseline 1.62 ft/sec wtih cane   Time 4   Period Weeks   Status On-going   PT SHORT TERM GOAL #4   Title Independent in HEP for balance and vestibular exercises.   (04-18-14)   Time 4   Period Weeks   Status On-going           PT Long Term Goals - 03/24/14 1658  PT LONG TERM GOAL #1   Title Amb. 360' with cane with SBA without LOB on flat, even surface for incr. community accessibility.  (05-18-14)   Time 8   Period Weeks   Status On-going   PT LONG TERM GOAL #2   Title Report at least 25% improvement in vertigo for safety with mobility.  (05-18-14)   Time 8   Period Weeks   Status On-going   PT LONG TERM GOAL #3   Title Improve TUG score to </= 14.5 secs with cane.  (05-18-14)   Baseline 21.84 secs with cane   Time 8   Period Weeks   Status On-going   PT LONG TERM GOAL #4   Title Incr. gait velocity to >/= 2.1 ft/sec with cane for incr. gait efficiency.  (target date 05-18-14)   Baseline 1.62 ft/sec with cane   Time 8   Period Weeks   Status On-going               Plan - 03/24/14 1654    Clinical Impression Statement Patient at risk for falls per Merrilee Jansky, no more falls since last visit.  Still using cane, but may be appropriate for indoor surfaces per Merrilee Jansky, but potentially not if vertiginous.  Patient states nausea associated with symptoms as well.  Able to perform standing balance and vestibular stimulation activities today without too much symptom provocation.    Pt will benefit from skilled therapeutic intervention in order to improve on the following deficits Abnormal gait;Decreased safety awareness;Impaired sensation;Decreased activity tolerance;Decreased balance;Decreased cognition;Decreased mobility;Decreased strength;Other (comment)  vesibular deficits, question impaired vision   Rehab Potential Good   PT Frequency 2x / week   PT Duration 8 weeks   PT Treatment/Interventions ADLs/Self Care Home Management;Therapeutic activities;Patient/family education;Therapeutic exercise;Gait training;Balance training;Stair training;Neuromuscular re-education;Functional mobility training   PT Next Visit Plan Assess HEP, DVA, head thrust test,  walk with head turns   Consulted and Agree with Plan of Care Patient        Problem List Patient Active Problem List   Diagnosis Date Noted  . Hereditary and idiopathic peripheral neuropathy 02/19/2014  . Cognitive decline 02/19/2014  . CAD (coronary artery disease)   . Type 2 diabetes mellitus with vascular disease   . Hyperlipidemia   . Hypertensive heart disease   . Obesity (BMI 30-39.9)   . GERD (gastroesophageal reflux disease)   . Bullous pemphigus   . Diabetic peripheral neuropathy     Bryley Kovacevic,CYNDI 03/24/2014, 4:59 PM  [facility] [address] [place], [place], [address] Phone: [phone]   Fax:  [phone]

## 2014-03-26 ENCOUNTER — Ambulatory Visit: Payer: Medicare Other | Admitting: Physical Therapy

## 2014-03-26 ENCOUNTER — Telehealth: Payer: Self-pay | Admitting: Neurology

## 2014-03-26 DIAGNOSIS — R42 Dizziness and giddiness: Secondary | ICD-10-CM | POA: Diagnosis not present

## 2014-03-26 DIAGNOSIS — R269 Unspecified abnormalities of gait and mobility: Secondary | ICD-10-CM

## 2014-03-26 NOTE — Telephone Encounter (Signed)
I left a message. Our last appointment focused on her short-term memory and cognitive deficits. So I would need to evaluate her dizziness a little more in a follow up appointment. Asked patient to call and make a follow up.

## 2014-03-26 NOTE — Telephone Encounter (Signed)
Patient's daughter, stated mom's experiencing dizziness and being off balance.  She thought her ears were bothering her and took to ENT and found out ears are ok.  ENT ordered Physical Therapy and hasn't helped with dizziness, patient seems to get very nauseated with dizziness.  PCP, Dr. Shon Baton order CT scan 6 mos ago to r/o TIA or Stroke, results were unremarkable.  Daughter questioning if there's other test that could be done?  Please call and advise.

## 2014-03-27 ENCOUNTER — Encounter: Payer: Self-pay | Admitting: Physical Therapy

## 2014-03-27 NOTE — Therapy (Signed)
[facility] [address] [place], [place], [address] Phone: [phone]   Fax:  [phone]  Physical Therapy Treatment  Patient Details  Name: Linda Oliver MRN: [id number] Date of Birth: 07-13-1932 Referring Provider:  Shon Baton, MD  Encounter Date: 03/26/2014      PT End of Session - 03/27/14 1627    Visit Number 3  G3   Number of Visits 17   Date for PT Re-Evaluation 05/18/14   Authorization Type Medicare   Authorization Time Period 03-19-14 - 05-18-14   PT Start Time 1108   PT Stop Time 1150   PT Time Calculation (min) 42 min      Past Medical History  Diagnosis Date  . CAD (coronary artery disease)     [place]   80% stenosis ostial Left main, 30% stenosis proximal LAD, 80% stenosis proximal RCA CABG  -  LIMA to LAD, SVG to int, SVG to [place], [place]   . Hypertensive heart disease   . Type 2 diabetes mellitus with vascular disease   . Bullous pemphigus   . Diabetic peripheral neuropathy   . GERD (gastroesophageal reflux disease)   . Hyperlipidemia   . Obesity (BMI 30-39.9)   . Anginal pain     occ  . Hypertension   . Sleep apnea     dx 83yrs ago doesnt use cpap but uses O2 at nite 2L  . Heart murmur     mvp  . Stones in the urinary tract   . UTI (lower urinary tract infection)   . Headache(784.0)     hx migraines  . Nasal bleeding     occ due to O2  . Peripheral neuropathy   . Osteopenia   . OA (osteoarthritis)   . Broken ankle   . Seizure     2009    Past Surgical History  Procedure Laterality Date  . Coronary artery bypass graft  96  . Cardiac catheterization    . Abdominal hysterectomy  70's  . Cholecystectomy    . Shoulder arthroscopy      rt  . Shoulder arthroscopy with rotator cuff repair and subacromial decompression  12/01/2011    Procedure: SHOULDER ARTHROSCOPY WITH ROTATOR CUFF REPAIR AND SUBACROMIAL DECOMPRESSION;  Surgeon: Nita Sells, MD;   Location: [facility];  Service: Orthopedics;  Laterality: Left;  shoulder debridement calcific tendonitis    There were no vitals filed for this visit.  Visit Diagnosis:  Abnormality of gait  Dizziness and giddiness      Subjective Assessment - 03/27/14 1623    Symptoms Pt. reports today is not a good day - is very unsteady and "off balance"; pt. using a cane for assist. with ambulation; reports having some dizziness - intensity 5-6/10    Pertinent History Peripheral neuropathy; Diabetes; HTN; Alzheimer's   Patient Stated Goals Improve balance and reduce the vertigo   Currently in Pain? No/denies     NeuroRe-ed: single limb stance exercises - with UE support at counter as needed- pt. Performed forward, back and side kicks X 10 reps each; marching in place x 10 reps each; sidestepping 2 times along counter; crossovers front with min assist  for recovery of balance; attempted partial tandem stance with UE support with min A; single limb stance x 10 secs with UE  support with min A; standing on incline - alternate stepping with min A; marching in place on incline and on decline with  Min assist Alternate tap ups to 6" step with CGA with use of rails prn Sit to stand without UE support x 5 reps standing unsupported with head turns with CGA                          PT Education - 03/27/14 1626    Education Details kicks, marching , sidestepping and single limb stance for balance HEP   Person(s) Educated Patient   Methods Explanation;Demonstration;Handout   Comprehension Verbalized understanding;Returned demonstration          PT Short Term Goals - 03/24/14 1657    PT SHORT TERM GOAL #1   Title Improve TUG with cane to </= 17.5 secs to decr. fall risk (target date 04-18-14)   Baseline 21.84 secs with cane   Time 4   Period Weeks   Status On-going   PT SHORT TERM GOAL #2   Title Improve Berg balance test score by at least 4 points for reduced fall risk.  (04-18-14)    Baseline TBA next visit   Time 4   Period Weeks   Status On-going   PT SHORT TERM GOAL #3   Title Incr. gait velocity to >/= 1.9 ft/sec with cane for incr. gait efficiency. (04-18-14)   Baseline 1.62 ft/sec wtih cane   Time 4   Period Weeks   Status On-going   PT SHORT TERM GOAL #4   Title Independent in HEP for balance and vestibular exercises.  (04-18-14)   Time 4   Period Weeks   Status On-going           PT Long Term Goals - 03/24/14 1658    PT LONG TERM GOAL #1   Title Amb. 360' with cane with SBA without LOB on flat, even surface for incr. community accessibility.  (05-18-14)   Time 8   Period Weeks   Status On-going   PT LONG TERM GOAL #2   Title Report at least 25% improvement in vertigo for safety with mobility.  (05-18-14)   Time 8   Period Weeks   Status On-going   PT LONG TERM GOAL #3   Title Improve TUG score to </= 14.5 secs with cane.  (05-18-14)   Baseline 21.84 secs with cane   Time 8   Period Weeks   Status On-going   PT LONG TERM GOAL #4   Title Incr. gait velocity to >/= 2.1 ft/sec with cane for incr. gait efficiency.  (target date 05-18-14)   Baseline 1.62 ft/sec with cane   Time 8   Period Weeks   Status On-going               Plan - 03/27/14 1628    Clinical Impression Statement Pt. reported she felt more steady as she exercised and moved more during PT session ; pt. would be safer with use of RW rather than cane for assist. with ambulation   Pt will benefit from skilled therapeutic intervention in order to improve on the following deficits Abnormal gait;Decreased safety awareness;Impaired sensation;Decreased activity tolerance;Decreased balance;Decreased cognition;Decreased mobility;Decreased strength;Other (comment)   Rehab Potential Good   PT Frequency 2x / week   PT Duration 8 weeks   PT Treatment/Interventions ADLs/Self Care Home Management;Therapeutic activities;Patient/family education;Therapeutic exercise;Gait training;Balance  training;Stair training;Neuromuscular re-education;Functional mobility training   PT Next Visit Plan check balance HEP; cont with balance and gait training   PT Home Exercise Plan balance HEP   Consulted and Agree  with Plan of Care Patient        Problem List Patient Active Problem List   Diagnosis Date Noted  . Hereditary and idiopathic peripheral neuropathy 02/19/2014  . Cognitive decline 02/19/2014  . CAD (coronary artery disease)   . Type 2 diabetes mellitus with vascular disease   . Hyperlipidemia   . Hypertensive heart disease   . Obesity (BMI 30-39.9)   . GERD (gastroesophageal reflux disease)   . Bullous pemphigus   . Diabetic peripheral neuropathy     Alda Lea, PT 03/27/2014, 4:31 PM  [facility] [address] [place], [place], [address] Phone: [phone]   Fax:  [phone]

## 2014-03-31 ENCOUNTER — Ambulatory Visit: Payer: Medicare Other | Admitting: Physical Therapy

## 2014-03-31 ENCOUNTER — Encounter: Payer: Self-pay | Admitting: Physical Therapy

## 2014-03-31 DIAGNOSIS — R269 Unspecified abnormalities of gait and mobility: Secondary | ICD-10-CM

## 2014-03-31 DIAGNOSIS — R42 Dizziness and giddiness: Secondary | ICD-10-CM | POA: Diagnosis not present

## 2014-03-31 NOTE — Therapy (Signed)
[facility] [address] [place], [place], [address] Phone: [phone]   Fax:  [phone]  Physical Therapy Treatment  Patient Details  Name: Linda Oliver MRN: [id number] Date of Birth: 09/03/1932 Referring Provider:  Shon Baton, MD  Encounter Date: 03/31/2014      PT End of Session - 03/31/14 2001    Visit Number 4  G4   Number of Visits 17   Date for PT Re-Evaluation 05/18/14   Authorization Type Medicare   Authorization Time Period 03-19-14 - 05-18-14   PT Start Time 1103   PT Stop Time 1146   PT Time Calculation (min) 43 min      Past Medical History  Diagnosis Date  . CAD (coronary artery disease)     [place]   80% stenosis ostial Left main, 30% stenosis proximal LAD, 80% stenosis proximal RCA CABG  -  LIMA to LAD, SVG to int, SVG to [place], [place]   . Hypertensive heart disease   . Type 2 diabetes mellitus with vascular disease   . Bullous pemphigus   . Diabetic peripheral neuropathy   . GERD (gastroesophageal reflux disease)   . Hyperlipidemia   . Obesity (BMI 30-39.9)   . Anginal pain     occ  . Hypertension   . Sleep apnea     dx 72yrs ago doesnt use cpap but uses O2 at nite 2L  . Heart murmur     mvp  . Stones in the urinary tract   . UTI (lower urinary tract infection)   . Headache(784.0)     hx migraines  . Nasal bleeding     occ due to O2  . Peripheral neuropathy   . Osteopenia   . OA (osteoarthritis)   . Broken ankle   . Seizure     2009    Past Surgical History  Procedure Laterality Date  . Coronary artery bypass graft  96  . Cardiac catheterization    . Abdominal hysterectomy  70's  . Cholecystectomy    . Shoulder arthroscopy      rt  . Shoulder arthroscopy with rotator cuff repair and subacromial decompression  12/01/2011    Procedure: SHOULDER ARTHROSCOPY WITH ROTATOR CUFF REPAIR AND SUBACROMIAL DECOMPRESSION;  Surgeon: Nita Sells, MD;   Location: [facility];  Service: Orthopedics;  Laterality: Left;  shoulder debridement calcific tendonitis    There were no vitals filed for this visit.  Visit Diagnosis:  Abnormality of gait      Subjective Assessment - 03/31/14 1954    Symptoms Pt. states she went to see her husband in the nursing home yesterday and did not use her cane; "balance is alot better today"   Pertinent History Peripheral neuropathy; Diabetes; HTN; Alzheimer's   Patient Stated Goals Improve balance and reduce the vertigo   Currently in Pain? No/denies                            Balance Exercises - 03/31/14 2000    Balance Exercises: Standing   Standing Eyes Closed Foam/compliant surface;2 reps;10 secs   Rockerboard Anterior/posterior;Lateral;10 reps;UE support   Step Ups Forward;6 inch;UE support 2   Sidestepping 1 rep   Cone Rotation Foam/compliant surface;Right turn;Left turn   Other Standing Exercises tipping cones over and back up on compliant surface with CGA;  stepping over and back of 1/2 bolster inside bars without UE  support    OTAGO PROGRAM   Sit to Stand 10 reps, no support     TherEx;  Sit to stand x 10 reps without UE support; heel raises x 10 reps; forward step ups x 10 reps; step down x 5 reps each leg        PT Short Term Goals - 03/24/14 1657    PT SHORT TERM GOAL #1   Title Improve TUG with cane to </= 17.5 secs to decr. fall risk (target date 04-18-14)   Baseline 21.84 secs with cane   Time 4   Period Weeks   Status On-going   PT SHORT TERM GOAL #2   Title Improve Berg balance test score by at least 4 points for reduced fall risk.  (04-18-14)   Baseline TBA next visit   Time 4   Period Weeks   Status On-going   PT SHORT TERM GOAL #3   Title Incr. gait velocity to >/= 1.9 ft/sec with cane for incr. gait efficiency. (04-18-14)   Baseline 1.62 ft/sec wtih cane   Time 4   Period Weeks   Status On-going   PT SHORT TERM GOAL #4   Title Independent in HEP for  balance and vestibular exercises.  (04-18-14)   Time 4   Period Weeks   Status On-going           PT Long Term Goals - 03/24/14 1658    PT LONG TERM GOAL #1   Title Amb. 360' with cane with SBA without LOB on flat, even surface for incr. community accessibility.  (05-18-14)   Time 8   Period Weeks   Status On-going   PT LONG TERM GOAL #2   Title Report at least 25% improvement in vertigo for safety with mobility.  (05-18-14)   Time 8   Period Weeks   Status On-going   PT LONG TERM GOAL #3   Title Improve TUG score to </= 14.5 secs with cane.  (05-18-14)   Baseline 21.84 secs with cane   Time 8   Period Weeks   Status On-going   PT LONG TERM GOAL #4   Title Incr. gait velocity to >/= 2.1 ft/sec with cane for incr. gait efficiency.  (target date 05-18-14)   Baseline 1.62 ft/sec with cane   Time 8   Period Weeks   Status On-going               Plan - 03/31/14 2002    Clinical Impression Statement Pt.'s balance is significantly improved today with pt. able to amb. some in clinic without use of cane; able to receover LOB independently   Pt will benefit from skilled therapeutic intervention in order to improve on the following deficits Abnormal gait;Decreased safety awareness;Impaired sensation;Decreased activity tolerance;Decreased balance;Decreased cognition;Decreased mobility;Decreased strength;Other (comment)   Rehab Potential Good   PT Frequency 2x / week   PT Duration 8 weeks   PT Treatment/Interventions ADLs/Self Care Home Management;Therapeutic activities;Patient/family education;Therapeutic exercise;Gait training;Balance training;Stair training;Neuromuscular re-education;Functional mobility training   PT Next Visit Plan check balance HEP; cont with balance and gait training   PT Home Exercise Plan balance HEP   Consulted and Agree with Plan of Care Patient        Problem List Patient Active Problem List   Diagnosis Date Noted  . Hereditary and idiopathic  peripheral neuropathy 02/19/2014  . Cognitive decline 02/19/2014  . CAD (coronary artery disease)   . Type 2 diabetes mellitus with vascular disease   .  Hyperlipidemia   . Hypertensive heart disease   . Obesity (BMI 30-39.9)   . GERD (gastroesophageal reflux disease)   . Bullous pemphigus   . Diabetic peripheral neuropathy     Alda Lea, PT 03/31/2014, 8:04 PM  [facility] [address] [place], [place], [address] Phone: [phone]   Fax:  [phone]

## 2014-04-03 ENCOUNTER — Ambulatory Visit: Payer: Medicare Other | Admitting: Physical Therapy

## 2014-04-03 DIAGNOSIS — E119 Type 2 diabetes mellitus without complications: Secondary | ICD-10-CM | POA: Diagnosis not present

## 2014-04-03 DIAGNOSIS — H26491 Other secondary cataract, right eye: Secondary | ICD-10-CM | POA: Diagnosis not present

## 2014-04-03 DIAGNOSIS — Z961 Presence of intraocular lens: Secondary | ICD-10-CM | POA: Diagnosis not present

## 2014-04-03 DIAGNOSIS — Z794 Long term (current) use of insulin: Secondary | ICD-10-CM | POA: Diagnosis not present

## 2014-04-03 DIAGNOSIS — H35342 Macular cyst, hole, or pseudohole, left eye: Secondary | ICD-10-CM | POA: Diagnosis not present

## 2014-04-07 ENCOUNTER — Ambulatory Visit: Payer: Medicare Other

## 2014-04-07 DIAGNOSIS — R42 Dizziness and giddiness: Secondary | ICD-10-CM | POA: Diagnosis not present

## 2014-04-07 DIAGNOSIS — R269 Unspecified abnormalities of gait and mobility: Secondary | ICD-10-CM

## 2014-04-07 NOTE — Therapy (Signed)
[facility] [address] [place], [place], [address] Phone: [phone]   Fax:  [phone]  Physical Therapy Treatment  Patient Details  Name: Linda Oliver MRN: [id number] Date of Birth: 01-Oct-1932 Referring Provider:  Shon Baton, MD  Encounter Date: 04/07/2014      PT End of Session - 04/07/14 1702    Visit Number 5   Number of Visits 17   Date for PT Re-Evaluation 05/18/14   Authorization Type Medicare   Authorization Time Period 03-19-14 - 05-18-14   PT Start Time 1316   PT Stop Time 1400   PT Time Calculation (min) 44 min   Activity Tolerance Patient tolerated treatment well   Behavior During Therapy [facility] for tasks assessed/performed      Past Medical History  Diagnosis Date  . CAD (coronary artery disease)     [place]   80% stenosis ostial Left main, 30% stenosis proximal LAD, 80% stenosis proximal RCA CABG  -  LIMA to LAD, SVG to int, SVG to [place], [place]   . Hypertensive heart disease   . Type 2 diabetes mellitus with vascular disease   . Bullous pemphigus   . Diabetic peripheral neuropathy   . GERD (gastroesophageal reflux disease)   . Hyperlipidemia   . Obesity (BMI 30-39.9)   . Anginal pain     occ  . Hypertension   . Sleep apnea     dx 68yrs ago doesnt use cpap but uses O2 at nite 2L  . Heart murmur     mvp  . Stones in the urinary tract   . UTI (lower urinary tract infection)   . Headache(784.0)     hx migraines  . Nasal bleeding     occ due to O2  . Peripheral neuropathy   . Osteopenia   . OA (osteoarthritis)   . Broken ankle   . Seizure     2009    Past Surgical History  Procedure Laterality Date  . Coronary artery bypass graft  96  . Cardiac catheterization    . Abdominal hysterectomy  70's  . Cholecystectomy    . Shoulder arthroscopy      rt  . Shoulder arthroscopy with rotator cuff repair and subacromial decompression  12/01/2011    Procedure:  SHOULDER ARTHROSCOPY WITH ROTATOR CUFF REPAIR AND SUBACROMIAL DECOMPRESSION;  Surgeon: Nita Sells, MD;  Location: [facility];  Service: Orthopedics;  Laterality: Left;  shoulder debridement calcific tendonitis    There were no vitals filed for this visit.  Visit Diagnosis:  Abnormality of gait  Dizziness and giddiness      Subjective Assessment - 04/07/14 1318    Symptoms Had good Easter celebration with family.  Spouse home for visit from nursing home.  Son in law helps to get him in and out.  Still always dizzy.  Head feels heavy and with water moving around in there.   Going to have eye surgery the 6th of April to remove film over right eye lens.   Currently in Pain? No/denies                        Vestibular Treatment/Exercise - 04/07/14 1700    Vestibular Treatment/Exercise   Vestibular Treatment Provided Gaze   Gaze Exercises X1 Viewing Horizontal;X1 Viewing Vertical   X1 Viewing Horizontal   Foot Position apart, the partial tandem x 2 changing feet    Reps  10   Comments denies dizziness, min LOB            Balance Exercises - 04/07/14 1326    Balance Exercises: Standing   Rockerboard Anterior/posterior;Head turns;Intermittent UE support;EC  head nods  all close supervision or minguard   Step Ups Forward  on/off compliant surface forward and back   Gait with Head Turns Foam/compliant surface;4 reps;Forward   Sidestepping Foam/compliant support;5 reps  then crossing over in front and behind (UE assist for back)   Turning 10 reps;Both  around cones walking on compliant surface, min assist   Step Over Hurdles / Cones x 4 over cones min assist   Other Standing Exercises standing tapping cones alternating feet min guard assist             PT Short Term Goals - 03/24/14 1657    PT SHORT TERM GOAL #1   Title Improve TUG with cane to </= 17.5 secs to decr. fall risk (target date 04-18-14)   Baseline 21.84 secs with cane   Time 4   Period  Weeks   Status On-going   PT SHORT TERM GOAL #2   Title Improve Berg balance test score by at least 4 points for reduced fall risk.  (04-18-14)   Baseline TBA next visit   Time 4   Period Weeks   Status On-going   PT SHORT TERM GOAL #3   Title Incr. gait velocity to >/= 1.9 ft/sec with cane for incr. gait efficiency. (04-18-14)   Baseline 1.62 ft/sec wtih cane   Time 4   Period Weeks   Status On-going   PT SHORT TERM GOAL #4   Title Independent in HEP for balance and vestibular exercises.  (04-18-14)   Time 4   Period Weeks   Status On-going           PT Long Term Goals - 03/24/14 1658    PT LONG TERM GOAL #1   Title Amb. 360' with cane with SBA without LOB on flat, even surface for incr. community accessibility.  (05-18-14)   Time 8   Period Weeks   Status On-going   PT LONG TERM GOAL #2   Title Report at least 25% improvement in vertigo for safety with mobility.  (05-18-14)   Time 8   Period Weeks   Status On-going   PT LONG TERM GOAL #3   Title Improve TUG score to </= 14.5 secs with cane.  (05-18-14)   Baseline 21.84 secs with cane   Time 8   Period Weeks   Status On-going   PT LONG TERM GOAL #4   Title Incr. gait velocity to >/= 2.1 ft/sec with cane for incr. gait efficiency.  (target date 05-18-14)   Baseline 1.62 ft/sec with cane   Time 8   Period Weeks   Status On-going               Plan - 04/07/14 1401    Clinical Impression Statement Patient tolerated increased intensity of balance and gait activity with compliant surfaces.  No increase in dizziness with VOR activities.   Pt will benefit from skilled therapeutic intervention in order to improve on the following deficits Abnormal gait;Decreased safety awareness;Impaired sensation;Decreased activity tolerance;Decreased balance;Decreased cognition;Decreased mobility;Decreased strength;Other (comment)   Rehab Potential Good   PT Frequency 2x / week   PT Duration 8 weeks   PT Treatment/Interventions ADLs/Self  Care Home Management;Therapeutic activities;Patient/family education;Therapeutic exercise;Gait training;Balance training;Stair training;Neuromuscular re-education;Functional mobility training   PT  Next Visit Plan Check for script for walker!!!  SOT?   Consulted and Agree with Plan of Care Patient        Problem List Patient Active Problem List   Diagnosis Date Noted  . Hereditary and idiopathic peripheral neuropathy 02/19/2014  . Cognitive decline 02/19/2014  . CAD (coronary artery disease)   . Type 2 diabetes mellitus with vascular disease   . Hyperlipidemia   . Hypertensive heart disease   . Obesity (BMI 30-39.9)   . GERD (gastroesophageal reflux disease)   . Bullous pemphigus   . Diabetic peripheral neuropathy     Ahliya Glatt,CYNDI 04/07/2014, 5:05 PM  [facility] [address] [place], [place], [address] Phone: [phone]   Fax:  [phone]

## 2014-04-10 ENCOUNTER — Ambulatory Visit: Payer: Medicare Other

## 2014-04-10 DIAGNOSIS — R269 Unspecified abnormalities of gait and mobility: Secondary | ICD-10-CM

## 2014-04-10 DIAGNOSIS — R42 Dizziness and giddiness: Secondary | ICD-10-CM

## 2014-04-10 NOTE — Therapy (Signed)
[facility] [address] [place], [place], [address] Phone: [phone]   Fax:  [phone]  Physical Therapy Treatment  Patient Details  Name: Linda Oliver MRN: [id number] Date of Birth: November 22, 1932 Referring Provider:  Shon Baton, MD  Encounter Date: 04/10/2014      PT End of Session - 04/10/14 1628    Visit Number 6   Number of Visits 17   Date for PT Re-Evaluation 05/18/14   Authorization Type Medicare   Authorization Time Period 03-19-14 - 05-18-14   PT Start Time 1532   PT Stop Time 1620   PT Time Calculation (min) 48 min   Activity Tolerance Patient tolerated treatment well   Behavior During Therapy [facility] for tasks assessed/performed      Past Medical History  Diagnosis Date  . CAD (coronary artery disease)     [place]   80% stenosis ostial Left main, 30% stenosis proximal LAD, 80% stenosis proximal RCA CABG  -  LIMA to LAD, SVG to int, SVG to [place], [place]   . Hypertensive heart disease   . Type 2 diabetes mellitus with vascular disease   . Bullous pemphigus   . Diabetic peripheral neuropathy   . GERD (gastroesophageal reflux disease)   . Hyperlipidemia   . Obesity (BMI 30-39.9)   . Anginal pain     occ  . Hypertension   . Sleep apnea     dx 30yrs ago doesnt use cpap but uses O2 at nite 2L  . Heart murmur     mvp  . Stones in the urinary tract   . UTI (lower urinary tract infection)   . Headache(784.0)     hx migraines  . Nasal bleeding     occ due to O2  . Peripheral neuropathy   . Osteopenia   . OA (osteoarthritis)   . Broken ankle   . Seizure     2009    Past Surgical History  Procedure Laterality Date  . Coronary artery bypass graft  96  . Cardiac catheterization    . Abdominal hysterectomy  70's  . Cholecystectomy    . Shoulder arthroscopy      rt  . Shoulder arthroscopy with rotator cuff repair and subacromial decompression  12/01/2011    Procedure:  SHOULDER ARTHROSCOPY WITH ROTATOR CUFF REPAIR AND SUBACROMIAL DECOMPRESSION;  Surgeon: Nita Sells, MD;  Location: [facility];  Service: Orthopedics;  Laterality: Left;  shoulder debridement calcific tendonitis    There were no vitals filed for this visit.  Visit Diagnosis:  Abnormality of gait  Dizziness and giddiness      Subjective Assessment - 04/10/14 1534    Symptoms Dizziness no more than 5-6/10 today.                       [facility] Adult PT Treatment/Exercise - 04/10/14 0001    Ambulation/Gait   Ambulation/Gait Yes   Ambulation/Gait Assistance 7: Independent   Ambulation Distance (Feet) 200 Feet   Assistive device None   Gait Pattern Within Functional Limits   Ambulation Surface Level;Indoor   Gait velocity 2.78  no device   Ramp 7: Independent   Curb 5: Supervision   Curb Details (indicate cue type and reason) without device, needed supervision for safety stepping up with lateral loss of balance and self recovery, pt reports due to knees weak.   Balance   Balance Assessed Yes   Standardized  Balance Assessment   Standardized Balance Assessment Timed Up and Go Test;Balance Master Testing   Timed Up and Go Test   TUG Normal TUG   Normal TUG (seconds) 12.3      Perfomed Sensory Orientation Test; but pt unable to complete past attempting condition 5 due to nausea.  Somatosensory and visual use WNL for age, but no information for vestibular use.       Balance Exercises - 04/10/14 1639    Balance Exercises: Standing   Standing Eyes Opened Foam/compliant surface;Head turns;3 reps  feet together and in partial tandems   Standing Eyes Closed Foam/compliant surface;2 reps;30 secs;Narrow base of support (BOS)  feet apart, then partial tandem switching foot positions   Marching Limitations marching in place on foam           PT Education - 04/10/14 1627    Education provided Yes   Education Details Balance in corner HEP   Person(s) Educated  Patient   Methods Explanation;Demonstration;Handout   Comprehension Returned demonstration;Verbalized understanding          PT Short Term Goals - 04/10/14 1634    PT SHORT TERM GOAL #1   Title Improve TUG with cane to </= 17.5 secs to decr. fall risk (target date 04-18-14)   Status Achieved   PT SHORT TERM GOAL #2   Title Improve Berg balance test score by at least 4 points for reduced fall risk.  (04-18-14)   PT SHORT TERM GOAL #3   Status On-going   PT SHORT TERM GOAL #4   Title Independent in HEP for balance and vestibular exercises.  (04-18-14)   Status On-going           PT Long Term Goals - 04/10/14 1634    PT LONG TERM GOAL #1   Title Amb. 360' with cane with SBA without LOB on flat, even surface for incr. community accessibility.  (05-18-14)   Status Achieved   PT LONG TERM GOAL #2   Title Report at least 25% improvement in vertigo for safety with mobility.  (05-18-14)   Status On-going   PT LONG TERM GOAL #3   Title Improve TUG score to </= 14.5 secs with cane.  (05-18-14)   Status Achieved   PT LONG TERM GOAL #4   Title Incr. gait velocity to >/= 2.1 ft/sec with cane for incr. gait efficiency.  (target date 05-18-14)   Status Achieved               Plan - 04/10/14 1628    Clinical Impression Statement Patient unable to complete SOT due to c/o nausea.  Reports feeling claustrophobic in box (she has claustrophobia).  Patient with ability, however to balance on foam with eyes closed so does have vestibular use.  Able to meet goals for TUG and gait velocity.  Talked about need for RW, but pt says has good days and bad days and feels would be good to have walker for use if feels more unsteady on certain days.   Pt will benefit from skilled therapeutic intervention in order to improve on the following deficits Abnormal gait;Decreased safety awareness;Impaired sensation;Decreased activity tolerance;Decreased balance;Decreased cognition;Decreased mobility;Decreased  strength;Other (comment)   Rehab Potential Good   PT Frequency 2x / week   PT Duration 8 weeks   PT Treatment/Interventions ADLs/Self Care Home Management;Therapeutic activities;Patient/family education;Therapeutic exercise;Gait training;Balance training;Stair training;Neuromuscular re-education;Functional mobility training   PT Next Visit Plan check HEP   Consulted and Agree with Plan of Care Patient  Problem List Patient Active Problem List   Diagnosis Date Noted  . Hereditary and idiopathic peripheral neuropathy 02/19/2014  . Cognitive decline 02/19/2014  . CAD (coronary artery disease)   . Type 2 diabetes mellitus with vascular disease   . Hyperlipidemia   . Hypertensive heart disease   . Obesity (BMI 30-39.9)   . GERD (gastroesophageal reflux disease)   . Bullous pemphigus   . Diabetic peripheral neuropathy     WYNN,CYNDI 04/10/2014, 4:43 PM  [facility]iel, [place] [address] [place], [place], [address] Phone: [phone]   Fax:  [phone]

## 2014-04-10 NOTE — Patient Instructions (Signed)
Feet Partial Heel-Toe (Compliant Surface) Head Motion - Eyes Open   With eyes open, standing on compliant surface: ___foam_____, right foot partially in front of the other, move head slowly: up and down.  Repeat moving head side to side Repeat __10__ times per session. Do __2__ sessions per day.  Copyright  VHI. All rights reserved.  Feet Apart (Compliant Surface) Varied Arm Positions - Eyes Closed   Stand on compliant surface: __foam______ with feet shoulder width apart and arms out. Close eyes and visualize upright position. Hold__30__ seconds. Repeat _2___ times per session. Do ___2_ sessions per day.  Can bring feet closer together if it gets easy.  Stand with back to a corner and chair back in front of you.  Copyright  VHI. All rights reserved.

## 2014-04-14 ENCOUNTER — Ambulatory Visit: Payer: Medicare Other | Attending: Otolaryngology | Admitting: Physical Therapy

## 2014-04-14 DIAGNOSIS — R269 Unspecified abnormalities of gait and mobility: Secondary | ICD-10-CM | POA: Diagnosis not present

## 2014-04-14 DIAGNOSIS — R42 Dizziness and giddiness: Secondary | ICD-10-CM | POA: Insufficient documentation

## 2014-04-15 ENCOUNTER — Encounter: Payer: Self-pay | Admitting: Physical Therapy

## 2014-04-15 NOTE — Therapy (Signed)
[facility] [address] [place], [place], [address] Phone: [phone]   Fax:  [phone]  Physical Therapy Treatment  Patient Details  Name: Linda Oliver MRN: [id number] Date of Birth: [birth year] Referring Provider:  Shon Baton, MD  Encounter Date: 04/14/2014      PT End of Session - 04/15/14 1749    Visit Number 7  G7   Number of Visits 17   Date for PT Re-Evaluation 05/18/14   Authorization Type Medicare   Authorization Time Period 03-19-14 - 05-18-14   PT Start Time 1533   PT Stop Time 1621   PT Time Calculation (min) 48 min      Past Medical History  Diagnosis Date  . CAD (coronary artery disease)     [place]   80% stenosis ostial Left main, 30% stenosis proximal LAD, 80% stenosis proximal RCA CABG  -  LIMA to LAD, SVG to int, SVG to [place], [place]   . Hypertensive heart disease   . Type 2 diabetes mellitus with vascular disease   . Bullous pemphigus   . Diabetic peripheral neuropathy   . GERD (gastroesophageal reflux disease)   . Hyperlipidemia   . Obesity (BMI 30-39.9)   . Anginal pain     occ  . Hypertension   . Sleep apnea     dx 12yrs ago doesnt use cpap but uses O2 at nite 2L  . Heart murmur     mvp  . Stones in the urinary tract   . UTI (lower urinary tract infection)   . Headache(784.0)     hx migraines  . Nasal bleeding     occ due to O2  . Peripheral neuropathy   . Osteopenia   . OA (osteoarthritis)   . Broken ankle   . Seizure     2009    Past Surgical History  Procedure Laterality Date  . Coronary artery bypass graft  96  . Cardiac catheterization    . Abdominal hysterectomy  70's  . Cholecystectomy    . Shoulder arthroscopy      rt  . Shoulder arthroscopy with rotator cuff repair and subacromial decompression  12/01/2011    Procedure: SHOULDER ARTHROSCOPY WITH ROTATOR CUFF REPAIR AND SUBACROMIAL DECOMPRESSION;  Surgeon: Nita Sells, MD;   Location: [facility];  Service: Orthopedics;  Laterality: Left;  shoulder debridement calcific tendonitis    There were no vitals filed for this visit.  Visit Diagnosis:  Abnormality of gait      Subjective Assessment - 04/15/14 1747    Subjective Pt. not using cane today - says balance is better today but it fluctuates   Pertinent History Peripheral neuropathy; Diabetes; HTN; Alzheimer's   Patient Stated Goals Improve balance and reduce the vertigo   Currently in Pain? No/denies                       [facility] Adult PT Treatment/Exercise - 04/15/14 0001    Ambulation/Gait   Ambulation/Gait Yes   Ambulation/Gait Assistance 7: Independent   Ambulation Distance (Feet) 240 Feet   Assistive device --  none   Gait Pattern Within Functional Limits   Ambulation Surface Level   Ramp 6: Modified independent (Device)   Curb 5: Supervision   Curb Details (indicate cue type and reason) no device used     TherEx; bil. Hip flex, ext, abduction with green theraband x 10 reps each; heel raises  x 10 reps; sit to stand x 10 reps Forward step ups x 10 reps each LE  NeuroRe-ed:  Alternate stepping on incline with min assist x 10 reps each; single limb stance activities on mats (blue and Red) for compliant surface training with min assist to CGA             PT Short Term Goals - 04/10/14 1634    PT SHORT TERM GOAL #1   Title Improve TUG with cane to </= 17.5 secs to decr. fall risk (target date 04-18-14)   Status Achieved   PT SHORT TERM GOAL #2   Title Improve Berg balance test score by at least 4 points for reduced fall risk.  (04-18-14)   PT SHORT TERM GOAL #3   Status On-going   PT SHORT TERM GOAL #4   Title Independent in HEP for balance and vestibular exercises.  (04-18-14)   Status On-going           PT Long Term Goals - 04/10/14 1634    PT LONG TERM GOAL #1   Title Amb. 360' with cane with SBA without LOB on flat, even surface for incr. community accessibility.   (05-18-14)   Status Achieved   PT LONG TERM GOAL #2   Title Report at least 25% improvement in vertigo for safety with mobility.  (05-18-14)   Status On-going   PT LONG TERM GOAL #3   Title Improve TUG score to </= 14.5 secs with cane.  (05-18-14)   Status Achieved   PT LONG TERM GOAL #4   Title Incr. gait velocity to >/= 2.1 ft/sec with cane for incr. gait efficiency.  (target date 05-18-14)   Status Achieved               Plan - 04/15/14 1750    Clinical Impression Statement pt.'s balance improved today with no use of cane by pt. - did not bring cane to PT today; pt. cont to demo decr. high level balance skills   Pt will benefit from skilled therapeutic intervention in order to improve on the following deficits Abnormal gait;Decreased safety awareness;Impaired sensation;Decreased activity tolerance;Decreased balance;Decreased cognition;Decreased mobility;Decreased strength;Other (comment)   Rehab Potential Good   PT Frequency 2x / week   PT Duration 8 weeks   PT Treatment/Interventions ADLs/Self Care Home Management;Therapeutic activities;Patient/family education;Therapeutic exercise;Gait training;Balance training;Stair training;Neuromuscular re-education;Functional mobility training   PT Next Visit Plan check HEP   PT Home Exercise Plan balance HEP   Consulted and Agree with Plan of Care Patient        Problem List Patient Active Problem List   Diagnosis Date Noted  . Hereditary and idiopathic peripheral neuropathy 02/19/2014  . Cognitive decline 02/19/2014  . CAD (coronary artery disease)   . Type 2 diabetes mellitus with vascular disease   . Hyperlipidemia   . Hypertensive heart disease   . Obesity (BMI 30-39.9)   . GERD (gastroesophageal reflux disease)   . Bullous pemphigus   . Diabetic peripheral neuropathy     Alda Lea 04/15/2014, 5:53 PM  [facility] [address] [place], [place],  [address] Phone: [phone]   Fax:  [phone]

## 2014-04-16 DIAGNOSIS — H26491 Other secondary cataract, right eye: Secondary | ICD-10-CM | POA: Diagnosis not present

## 2014-04-16 DIAGNOSIS — H2640 Unspecified secondary cataract: Secondary | ICD-10-CM | POA: Diagnosis not present

## 2014-04-17 ENCOUNTER — Ambulatory Visit: Payer: Medicare Other

## 2014-04-17 DIAGNOSIS — R42 Dizziness and giddiness: Secondary | ICD-10-CM

## 2014-04-17 DIAGNOSIS — R269 Unspecified abnormalities of gait and mobility: Secondary | ICD-10-CM | POA: Diagnosis not present

## 2014-04-17 NOTE — Therapy (Signed)
[facility] [address] [place], [place], [address] Phone: [phone]   Fax:  [phone]  Physical Therapy Treatment  Patient Details  Name: Linda Oliver MRN: [id number] Date of Birth: [birth year] Referring Provider:  Shon Baton, MD  Encounter Date: 04/17/2014      PT End of Session - 04/17/14 1707    Visit Number 8   Number of Visits 17   Date for PT Re-Evaluation 05/18/14   Authorization Type Medicare   Authorization Time Period 03-19-14 - 05-18-14   PT Start Time 1456   PT Stop Time 1532   PT Time Calculation (min) 36 min   Equipment Utilized During Treatment Gait belt   Activity Tolerance Patient tolerated treatment well   Behavior During Therapy [facility] for tasks assessed/performed      Past Medical History  Diagnosis Date  . CAD (coronary artery disease)     [place]   80% stenosis ostial Left main, 30% stenosis proximal LAD, 80% stenosis proximal RCA CABG  -  LIMA to LAD, SVG to int, SVG to [place], [place]   . Hypertensive heart disease   . Type 2 diabetes mellitus with vascular disease   . Bullous pemphigus   . Diabetic peripheral neuropathy   . GERD (gastroesophageal reflux disease)   . Hyperlipidemia   . Obesity (BMI 30-39.9)   . Anginal pain     occ  . Hypertension   . Sleep apnea     dx 84yrs ago doesnt use cpap but uses O2 at nite 2L  . Heart murmur     mvp  . Stones in the urinary tract   . UTI (lower urinary tract infection)   . Headache(784.0)     hx migraines  . Nasal bleeding     occ due to O2  . Peripheral neuropathy   . Osteopenia   . OA (osteoarthritis)   . Broken ankle   . Seizure     2009    Past Surgical History  Procedure Laterality Date  . Coronary artery bypass graft  96  . Cardiac catheterization    . Abdominal hysterectomy  70's  . Cholecystectomy    . Shoulder arthroscopy      rt  . Shoulder arthroscopy with rotator cuff repair and  subacromial decompression  12/01/2011    Procedure: SHOULDER ARTHROSCOPY WITH ROTATOR CUFF REPAIR AND SUBACROMIAL DECOMPRESSION;  Surgeon: Nita Sells, MD;  Location: [facility];  Service: Orthopedics;  Laterality: Left;  shoulder debridement calcific tendonitis    There were no vitals filed for this visit.  Visit Diagnosis:  Abnormality of gait  Dizziness and giddiness      Subjective Assessment - 04/17/14 1458    Subjective Getting senile.  Forgot appointment was this morning.  Don't want to go back in that box today.  Had laser surgery yesterday on right eye to remove film.  Helps some wth the dizziness.  4/10 today (reports 50% improvement in symptoms)   Currently in Pain? No/denies                       [facility] Adult PT Treatment/Exercise - 04/17/14 1500    Ambulation/Gait   Gait velocity 2.75 ft/sec   Ramp 7: Independent   Curb 7: Independent   Standardized Balance Assessment   Standardized Balance Assessment Timed Up and Go Test   Berg Balance Test   Sit to Stand Able to  stand without using hands and stabilize independently   Standing Unsupported Able to stand safely 2 minutes   Sitting with Back Unsupported but Feet Supported on Floor or Stool Able to sit safely and securely 2 minutes   Stand to Sit Sits safely with minimal use of hands   Transfers Able to transfer safely, minor use of hands   Standing Unsupported with Eyes Closed Able to stand 10 seconds safely   Standing Ubsupported with Feet Together Able to place feet together independently and stand 1 minute safely   From Standing, Reach Forward with Outstretched Arm Can reach confidently >25 cm (10")   From Standing Position, Pick up Object from Floor Able to pick up shoe safely and easily   From Standing Position, Turn to Look Behind Over each Shoulder Looks behind from both sides and weight shifts well   Turn 360 Degrees Able to turn 360 degrees safely one side only in 4 seconds or less    Standing Unsupported, Alternately Place Feet on Step/Stool Able to complete 4 steps without aid or supervision   Standing Unsupported, One Foot in Front Able to plae foot ahead of the other independently and hold 30 seconds   Standing on One Leg Tries to lift leg/unable to hold 3 seconds but remains standing independently   Total Score 49   Timed Up and Go Test   TUG Normal TUG   Normal TUG (seconds) 10.02         Vestibular Treatment/Exercise - 04/17/14 1704    Vestibular Treatment/Exercise   Vestibular Treatment Provided Gaze   Gaze Exercises X1 Viewing Horizontal;X1 Viewing Vertical   X1 Viewing Horizontal   Foot Position apart standing on pillow   Comments 60 sec cues for eyes on target denies increased dizziness   X1 Viewing Vertical   Foot Position apart standing on pillow   Comments 60 sec cues for eyes on target, denies increased dizziness            Balance Exercises - 04/17/14 1700    Balance Exercises: Standing   Tandem Gait 3 reps;Foam/compliant surface;Upper extremity support   Sidestepping Foam/compliant support;Upper extremity support;2 reps  crossing over in front and behind   Turning 10 reps  around cones on compliant surface   Step Over Hurdles / Cones standing to knock over cones and right them with one foot minguard assist             PT Short Term Goals - 04/17/14 1709    PT SHORT TERM GOAL #1   Title Improve TUG with cane to </= 17.5 secs to decr. fall risk (target date 04-18-14)   Status Achieved   PT SHORT TERM GOAL #2   Title Improve Berg balance test score by at least 4 points for reduced fall risk.  (04-18-14)   Status On-going   PT SHORT TERM GOAL #3   Title Incr. gait velocity to >/= 1.9 ft/sec with cane for incr. gait efficiency. (04-18-14)   Status Achieved   PT SHORT TERM GOAL #4   Title Independent in HEP for balance and vestibular exercises.  (04-18-14)   Status Achieved           PT Long Term Goals - 04/17/14 1710    PT LONG  TERM GOAL #1   Title Amb. 360' with cane with SBA without LOB on flat, even surface for incr. community accessibility.  (05-18-14)   PT LONG TERM GOAL #2   Title Report at least 25%  improvement in vertigo for safety with mobility.  (05-18-14)   Status Achieved   PT LONG TERM GOAL #3   Title Improve TUG score to </= 14.5 secs with cane.  (05-18-14)   Status Achieved   PT LONG TERM GOAL #4   Title Incr. gait velocity to >/= 2.1 ft/sec with cane for incr. gait efficiency.  (target date 05-18-14)   Status Achieved               Plan - 04/17/14 1707    Clinical Impression Statement Patient has met all long and short term goals.  She is interested in continuing PT through plan of care.  Discussed need to set new goals and she agrees to work more on dynamic gait skills, high level balance and vestibular adaptation.   Pt will benefit from skilled therapeutic intervention in order to improve on the following deficits Abnormal gait;Decreased safety awareness;Impaired sensation;Decreased activity tolerance;Decreased balance;Decreased cognition;Decreased mobility;Decreased strength;Other (comment)   Rehab Potential Good   PT Frequency 2x / week   PT Duration 8 weeks   PT Treatment/Interventions ADLs/Self Care Home Management;Therapeutic activities;Patient/family education;Therapeutic exercise;Gait training;Balance training;Stair training;Neuromuscular re-education;Functional mobility training   PT Next Visit Plan DGI, set new goals for vestibular use with balance (could not complete SOT) and for dynamic gait (renewal)   Consulted and Agree with Plan of Care Patient        Problem List Patient Active Problem List   Diagnosis Date Noted  . Hereditary and idiopathic peripheral neuropathy 02/19/2014  . Cognitive decline 02/19/2014  . CAD (coronary artery disease)   . Type 2 diabetes mellitus with vascular disease   . Hyperlipidemia   . Hypertensive heart disease   . Obesity (BMI 30-39.9)   .  GERD (gastroesophageal reflux disease)   . Bullous pemphigus   . Diabetic peripheral neuropathy     Justine Dines,CYNDI 04/17/2014, 5:12 PM  [facility] Kiel, [facility] [address] [place], [place], [address] Phone: [phone]   Fax:  [phone]

## 2014-04-21 ENCOUNTER — Encounter: Payer: Self-pay | Admitting: Physical Therapy

## 2014-04-21 ENCOUNTER — Ambulatory Visit: Payer: Medicare Other | Admitting: Physical Therapy

## 2014-04-21 DIAGNOSIS — R269 Unspecified abnormalities of gait and mobility: Secondary | ICD-10-CM | POA: Diagnosis not present

## 2014-04-21 DIAGNOSIS — R42 Dizziness and giddiness: Secondary | ICD-10-CM | POA: Diagnosis not present

## 2014-04-22 ENCOUNTER — Encounter: Payer: Self-pay | Admitting: Physical Therapy

## 2014-04-22 NOTE — Therapy (Signed)
[facility] [address] [place], [place], [address] Phone: [phone]   Fax:  [phone]  Physical Therapy Treatment  Patient Details  Name: Linda Oliver MRN: [id number] Date of Birth: 10-16-1932 Referring Provider:  Shon Baton, MD  Encounter Date: 04/21/2014      PT End of Session - 04/22/14 1734    Visit Number 9  G9   Number of Visits 17   Date for PT Re-Evaluation 05/18/14   Authorization Type Medicare   Authorization Time Period 03-19-14 - 05-18-14   PT Start Time 1535   PT Stop Time 1623   PT Time Calculation (min) 48 min   Equipment Utilized During Treatment Gait belt      Past Medical History  Diagnosis Date  . CAD (coronary artery disease)     [place]   80% stenosis ostial Left main, 30% stenosis proximal LAD, 80% stenosis proximal RCA CABG  -  LIMA to LAD, SVG to int, SVG to [place], [place]   . Hypertensive heart disease   . Type 2 diabetes mellitus with vascular disease   . Bullous pemphigus   . Diabetic peripheral neuropathy   . GERD (gastroesophageal reflux disease)   . Hyperlipidemia   . Obesity (BMI 30-39.9)   . Anginal pain     occ  . Hypertension   . Sleep apnea     dx 9yrs ago doesnt use cpap but uses O2 at nite 2L  . Heart murmur     mvp  . Stones in the urinary tract   . UTI (lower urinary tract infection)   . Headache(784.0)     hx migraines  . Nasal bleeding     occ due to O2  . Peripheral neuropathy   . Osteopenia   . OA (osteoarthritis)   . Broken ankle   . Seizure     2009    Past Surgical History  Procedure Laterality Date  . Coronary artery bypass graft  96  . Cardiac catheterization    . Abdominal hysterectomy  70's  . Cholecystectomy    . Shoulder arthroscopy      rt  . Shoulder arthroscopy with rotator cuff repair and subacromial decompression  12/01/2011    Procedure: SHOULDER ARTHROSCOPY WITH ROTATOR CUFF REPAIR AND SUBACROMIAL  DECOMPRESSION;  Surgeon: Nita Sells, MD;  Location: [facility];  Service: Orthopedics;  Laterality: Left;  shoulder debridement calcific tendonitis    There were no vitals filed for this visit.  Visit Diagnosis:  Abnormality of gait      Subjective Assessment - 04/22/14 1732    Subjective "balance is better"    Pertinent History Peripheral neuropathy; Diabetes; HTN; Alzheimer's   Patient Stated Goals Improve balance and reduce the vertigo   Currently in Pain? No/denies                       [facility] Adult PT Treatment/Exercise - 04/22/14 0001    Ambulation/Gait   Ambulation/Gait Yes   Ambulation/Gait Assistance 7: Independent   Ambulation Distance (Feet) 240 Feet   Assistive device --  none   Gait Pattern Within Functional Limits   Ambulation Surface Level   Ramp 7: Independent   Curb 7: Independent        Neuro re-ed: standing balance activities to improve single limb stance on compliant and noncompliant surfaces with min assist  To CGA; rockerboard with UE support prn with CGA  inside bars; standing on BOSU for weight shifts with CGA          PT Short Term Goals - 04/21/14 1537    PT SHORT TERM GOAL #1   Baseline 10.5 secs           PT Long Term Goals - 04/21/14 1540    PT LONG TERM GOAL #4   Title (p) Incr. gait velocity to >/= 2.1 ft/sec with cane for incr. gait efficiency.  (target date 05-18-14)               Plan - 04/22/14 1735    Clinical Impression Statement Pt. progressing well towards LTG's   Pt will benefit from skilled therapeutic intervention in order to improve on the following deficits Abnormal gait;Decreased safety awareness;Impaired sensation;Decreased activity tolerance;Decreased balance;Decreased cognition;Decreased mobility;Decreased strength;Other (comment)   Rehab Potential Good   PT Frequency 2x / week   PT Duration 8 weeks   PT Treatment/Interventions ADLs/Self Care Home Management;Therapeutic  activities;Patient/family education;Therapeutic exercise;Gait training;Balance training;Stair training;Neuromuscular re-education;Functional mobility training   PT Next Visit Plan DGI, set new goals for vestibular use with balance (could not complete SOT) and for dynamic gait (renewal)   PT Home Exercise Plan balance HEP   Consulted and Agree with Plan of Care Patient        Problem List Patient Active Problem List   Diagnosis Date Noted  . Hereditary and idiopathic peripheral neuropathy 02/19/2014  . Cognitive decline 02/19/2014  . CAD (coronary artery disease)   . Type 2 diabetes mellitus with vascular disease   . Hyperlipidemia   . Hypertensive heart disease   . Obesity (BMI 30-39.9)   . GERD (gastroesophageal reflux disease)   . Bullous pemphigus   . Diabetic peripheral neuropathy     Alda Lea, PT 04/22/2014, 5:36 PM  [facility] [address] [place], [place], [address] Phone: [phone]   Fax:  [phone]

## 2014-04-24 ENCOUNTER — Ambulatory Visit: Payer: Medicare Other | Admitting: Physical Therapy

## 2014-04-24 DIAGNOSIS — R42 Dizziness and giddiness: Secondary | ICD-10-CM | POA: Diagnosis not present

## 2014-04-24 DIAGNOSIS — R269 Unspecified abnormalities of gait and mobility: Secondary | ICD-10-CM

## 2014-04-25 ENCOUNTER — Encounter: Payer: Self-pay | Admitting: Physical Therapy

## 2014-04-25 NOTE — Therapy (Signed)
[facility] [address] [place], [place], [address] Phone: [phone]   Fax:  [phone]  Physical Therapy Treatment  Patient Details  Name: Linda Oliver MRN: [id number] Date of Birth: [birth year] Referring Provider:  Shon Baton, MD  Encounter Date: 04/24/2014      PT End of Session - 04/25/14 1102    Visit Number 10   Number of Visits 17   Date for PT Re-Evaluation 05/18/14   Authorization Type Medicare   Authorization Time Period 03-19-14 - 05-18-14   PT Start Time 1531   PT Stop Time 1601   PT Time Calculation (min) 30 min      Past Medical History  Diagnosis Date  . CAD (coronary artery disease)     [place]   80% stenosis ostial Left main, 30% stenosis proximal LAD, 80% stenosis proximal RCA CABG  -  LIMA to LAD, SVG to int, SVG to [place], [place]   . Hypertensive heart disease   . Type 2 diabetes mellitus with vascular disease   . Bullous pemphigus   . Diabetic peripheral neuropathy   . GERD (gastroesophageal reflux disease)   . Hyperlipidemia   . Obesity (BMI 30-39.9)   . Anginal pain     occ  . Hypertension   . Sleep apnea     dx 32yr ago doesnt use cpap but uses O2 at nite 2L  . Heart murmur     mvp  . Stones in the urinary tract   . UTI (lower urinary tract infection)   . Headache(784.0)     hx migraines  . Nasal bleeding     occ due to O2  . Peripheral neuropathy   . Osteopenia   . OA (osteoarthritis)   . Broken ankle   . Seizure     2009    Past Surgical History  Procedure Laterality Date  . Coronary artery bypass graft  96  . Cardiac catheterization    . Abdominal hysterectomy  70's  . Cholecystectomy    . Shoulder arthroscopy      rt  . Shoulder arthroscopy with rotator cuff repair and subacromial decompression  12/01/2011    Procedure: SHOULDER ARTHROSCOPY WITH ROTATOR CUFF REPAIR AND SUBACROMIAL DECOMPRESSION;  Surgeon: JNita Sells MD;   Location: [facility]  Service: Orthopedics;  Laterality: Left;  shoulder debridement calcific tendonitis    There were no vitals filed for this visit.  Visit Diagnosis:  Abnormality of gait      Subjective Assessment - 04/25/14 1047    Subjective "I'm doing better"  "Need to leave a little early today - another appt. scheduled"   Pertinent History Peripheral neuropathy; Diabetes; HTN; Alzheimer's   Patient Stated Goals Improve balance and reduce the vertigo   Currently in Pain? No/denies                       [facility] - [facility] 0001    Exercises   Exercises Lumbar;Knee/Hip   Lumbar Exercises: Aerobic   Stationary Bike 5" Nustep level 3   Lumbar Exercises: Standing   Heel Raises 10 reps   Knee/Hip Exercises: Standing   Heel Raises 10 reps   Forward Step Up Both;10 reps   Other Standing Knee Exercises bil. hip flexion. extension and abduction with green theraband x 10 reps eahc     Neuro Re-ed; single limb stance activities on blue foam - marching  with head turns with UE support prn with CGA;  Cone taps - with min assist with LOB - touching top of cone and then tipping cone over and standing upright (4 cones)  with min assist - standing on floor  Rockerboard x 1" anterior/posteriorly with UE support prn with CGA             PT Short Term Goals - 04/21/14 1537    PT SHORT TERM GOAL #1   Baseline 10.5 secs           PT Long Term Goals - 04/25/14 1053    PT LONG TERM GOAL #1   Title Amb. 360' with cane with SBA without LOB on flat, even surface for incr. community accessibility.  (05-18-14)   Baseline met 04-22-14   Status Achieved   PT LONG TERM GOAL #2   Title Report at least 25% improvement in vertigo for safety with mobility.  (05-18-14)   Baseline 05-18-14 target date   Status On-going   PT LONG TERM GOAL #3   Title Improve TUG score to </= 14.5 secs with cane.  (05-18-14)   Baseline met 04-22-14   Status Achieved   PT LONG  TERM GOAL #4   Title Incr. gait velocity to >/= 2.1 ft/sec with cane for incr. gait efficiency.  (target date 05-18-14)   Baseline met 04-22-14   PT LONG TERM GOAL #5   Title Negotiate 4 steps without rail using a step over step sequence to demo incr. balance   Baseline target date 05-18-14   Time 3   Period Weeks   Status New   Additional Long Term Goals   Additional Long Term Goals Yes   PT LONG TERM GOAL #6   Title Amb. 57' with head turns side to side without device without LOB and with vertiog report of </= 2/10 intensity for improved balance and environmental scanning   Baseline 05-18-14 target date   Time 3   Period Weeks   Status New               Plan - 04/25/14 1051    Clinical Impression Statement Pt. progressing well - able to ambulate safely without  constant reliance on cane    Pt will benefit from skilled therapeutic intervention in order to improve on the following deficits Abnormal gait;Decreased safety awareness;Impaired sensation;Decreased activity tolerance;Decreased balance;Decreased cognition;Decreased mobility;Decreased strength;Other (comment)   Rehab Potential Good   PT Frequency 2x / week   PT Duration 8 weeks   PT Treatment/Interventions ADLs/Self Care Home Management;Therapeutic activities;Patient/family education;Therapeutic exercise;Gait training;Balance training;Stair training;Neuromuscular re-education;Functional mobility training   PT Next Visit Plan cont gait and balance training   PT Home Exercise Plan balance HEP   Consulted and Agree with Plan of Care Patient          G-Codes - April 28, 2014 1058    Functional Assessment Tool Used TUG 10.5 secs without device:  gait velocity 1.62 ft/sec without device; pt. now able to amb. without use of cane - performance cont. to fluctuate depending on vertigo   Functional Limitation Mobility: Walking and moving around   Mobility: Walking and Moving Around Current Status [id number]) At least 20 percent but less than  40 percent impaired, limited or restricted   Mobility: Walking and Moving Around Goal Status [id number]) At least 1 percent but less than 20 percent impaired, limited or restricted      Problem List Patient Active Problem List   Diagnosis Date Noted  .  Hereditary and idiopathic peripheral neuropathy 02/19/2014  . Cognitive decline 02/19/2014  . CAD (coronary artery disease)   . Type 2 diabetes mellitus with vascular disease   . Hyperlipidemia   . Hypertensive heart disease   . Obesity (BMI 30-39.9)   . GERD (gastroesophageal reflux disease)   . Bullous pemphigus   . Diabetic peripheral neuropathy     Alda Lea, PT 04/25/2014, 11:03 AM  [facility] [address] [place], [place], [address] Phone: [phone]   Fax:  [phone]

## 2014-04-28 ENCOUNTER — Telehealth: Payer: Self-pay | Admitting: Neurology

## 2014-04-28 ENCOUNTER — Ambulatory Visit: Payer: Medicare Other | Admitting: Physical Therapy

## 2014-04-28 DIAGNOSIS — R42 Dizziness and giddiness: Secondary | ICD-10-CM

## 2014-04-28 DIAGNOSIS — R269 Unspecified abnormalities of gait and mobility: Secondary | ICD-10-CM | POA: Diagnosis not present

## 2014-04-28 MED ORDER — MEMANTINE HCL-DONEPEZIL HCL ER 28-10 MG PO CP24: 1.0000 | ORAL_CAPSULE | Freq: Every evening | ORAL | Status: DC

## 2014-04-28 NOTE — Telephone Encounter (Signed)
If she is already on Aricep 10mg  and Namenda 28mg  then it is fine to convert these 2 to Namzaric once a day combo thanks. Thank you.

## 2014-04-28 NOTE — Telephone Encounter (Signed)
Rx has been sent.  I called back to advise.  Got no answer.  Left message.

## 2014-04-28 NOTE — Telephone Encounter (Signed)
Patient's daughter is calling as the namzaric( memantine hcl extended relief 28 mg, donepezil hcl 10 mg) 1 capsule in evening.  Patient is tolerating very well and would like to get 90 day supply sent to [facility]. Please call!

## 2014-04-28 NOTE — Telephone Encounter (Signed)
Daughter is requesting a Rx for Namzaric.  Not currently on med list.  Last OV note says: Continue Aricept 10mg  daily. Can consider Namzaric in place of Aricept.  Okay to add and send?  Please advise.  Thank you.

## 2014-04-29 ENCOUNTER — Encounter: Payer: Self-pay | Admitting: Physical Therapy

## 2014-04-29 NOTE — Therapy (Signed)
[facility] [address] [place], [place], [address] Phone: [phone]   Fax:  [phone]  Physical Therapy Treatment  Patient Details  Name: Linda Oliver MRN: [id number] Date of Birth: 06/13/1932 Referring Provider:  Shon Baton, MD  Encounter Date: 04/28/2014      PT End of Session - 04/29/14 0909    Visit Number 11  G1   Number of Visits 17   Date for PT Re-Evaluation 05/18/14   Authorization Type Medicare   Authorization Time Period 03-19-14 - 05-18-14   PT Start Time 0932   PT Stop Time 1015   PT Time Calculation (min) 43 min      Past Medical History  Diagnosis Date  . CAD (coronary artery disease)     [place]   80% stenosis ostial Left main, 30% stenosis proximal LAD, 80% stenosis proximal RCA CABG  -  LIMA to LAD, SVG to int, SVG to [place], [place]   . Hypertensive heart disease   . Type 2 diabetes mellitus with vascular disease   . Bullous pemphigus   . Diabetic peripheral neuropathy   . GERD (gastroesophageal reflux disease)   . Hyperlipidemia   . Obesity (BMI 30-39.9)   . Anginal pain     occ  . Hypertension   . Sleep apnea     dx 54yrs ago doesnt use cpap but uses O2 at nite 2L  . Heart murmur     mvp  . Stones in the urinary tract   . UTI (lower urinary tract infection)   . Headache(784.0)     hx migraines  . Nasal bleeding     occ due to O2  . Peripheral neuropathy   . Osteopenia   . OA (osteoarthritis)   . Broken ankle   . Seizure     2009    Past Surgical History  Procedure Laterality Date  . Coronary artery bypass graft  96  . Cardiac catheterization    . Abdominal hysterectomy  70's  . Cholecystectomy    . Shoulder arthroscopy      rt  . Shoulder arthroscopy with rotator cuff repair and subacromial decompression  12/01/2011    Procedure: SHOULDER ARTHROSCOPY WITH ROTATOR CUFF REPAIR AND SUBACROMIAL DECOMPRESSION;  Surgeon: Nita Sells,  MD;  Location: [facility];  Service: Orthopedics;  Laterality: Left;  shoulder debridement calcific tendonitis    There were no vitals filed for this visit.  Visit Diagnosis:  Abnormality of gait  Dizziness and giddiness      Subjective Assessment - 04/29/14 0906    Subjective Pt. reports she had a fall on Friday evening - "left knee gave way" and she slowly lowered herself to ground - did not get hurt   Pertinent History Peripheral neuropathy; Diabetes; HTN; Alzheimer's   Patient Stated Goals Improve balance and reduce the vertigo   Currently in Pain? No/denies                         [facility] Adult PT Treatment/Exercise - 04/29/14 0001    Lumbar Exercises: Aerobic   Stationary Bike 5" Nustep level 3   Lumbar Exercises: Standing   Heel Raises 10 reps   Knee/Hip Exercises: Stretches   Active Hamstring Stretch 20 seconds  LLE   Gastroc Stretch 2 reps;30 seconds  LLE and RLE   Knee/Hip Exercises: Standing   Heel Raises 10 reps   Forward Step  Up Both;10 reps      Neuro Re-ed:  Single limb stance activities inside parallel bars with UE support prn with CGA - stepping over and back  of bolster x 10 reps without UE support; standing on blue foam with head turns with CGA; rockerboard with UE assist Prn with SBA anteriorly/posteriorly x 1" alternate tap ups to 6" step x 10 reps each foot; sit to stand x 10 reps on blue foam with 1 UE support           PT Short Term Goals - 04/21/14 1537    PT SHORT TERM GOAL #1   Baseline 10.5 secs           PT Long Term Goals - 04/25/14 1053    PT LONG TERM GOAL #1   Title Amb. 360' with cane with SBA without LOB on flat, even surface for incr. community accessibility.  (05-18-14)   Baseline met 04-22-14   Status Achieved   PT LONG TERM GOAL #2   Title Report at least 25% improvement in vertigo for safety with mobility.  (05-18-14)   Baseline 05-18-14 target date   Status On-going   PT LONG TERM GOAL #3   Title Improve TUG  score to </= 14.5 secs with cane.  (05-18-14)   Baseline met 04-22-14   Status Achieved   PT LONG TERM GOAL #4   Title Incr. gait velocity to >/= 2.1 ft/sec with cane for incr. gait efficiency.  (target date 05-18-14)   Baseline met 04-22-14   PT LONG TERM GOAL #5   Title Negotiate 4 steps without rail using a step over step sequence to demo incr. balance   Baseline target date 05-18-14   Time 3   Period Weeks   Status New   Additional Long Term Goals   Additional Long Term Goals Yes   PT LONG TERM GOAL #6   Title Amb. 11' with head turns side to side without device without LOB and with vertiog report of </= 2/10 intensity for improved balance and environmental scanning   Baseline 05-18-14 target date   Time 3   Period Weeks   Status New               Plan - 04/29/14 0911    Clinical Impression Statement Pt. continues to progress well with improving gait and balance; no occurrence of L knee instability noted during PT session today   Pt will benefit from skilled therapeutic intervention in order to improve on the following deficits Abnormal gait;Decreased safety awareness;Impaired sensation;Decreased activity tolerance;Decreased balance;Decreased cognition;Decreased mobility;Decreased strength;Other (comment)   Rehab Potential Good   PT Frequency 2x / week   PT Duration 8 weeks   PT Treatment/Interventions ADLs/Self Care Home Management;Therapeutic activities;Patient/family education;Therapeutic exercise;Gait training;Balance training;Stair training;Neuromuscular re-education;Functional mobility training   PT Next Visit Plan cont gait and balance training   PT Home Exercise Plan balance HEP   Consulted and Agree with Plan of Care Patient        Problem List Patient Active Problem List   Diagnosis Date Noted  . Hereditary and idiopathic peripheral neuropathy 02/19/2014  . Cognitive decline 02/19/2014  . CAD (coronary artery disease)   . Type 2 diabetes mellitus with vascular  disease   . Hyperlipidemia   . Hypertensive heart disease   . Obesity (BMI 30-39.9)   . GERD (gastroesophageal reflux disease)   . Bullous pemphigus   . Diabetic peripheral neuropathy     Aurora Rody, [place], PT  04/29/2014, 9:18 AM  [facility] [address] [place], [place], [address] Phone: [phone]   Fax:  [phone]

## 2014-04-30 ENCOUNTER — Telehealth: Payer: Self-pay

## 2014-04-30 NOTE — Telephone Encounter (Signed)
[employer] has approved the request for coverage on Namzaric effective until the policy changes or is terminated.  Ref # [id number]

## 2014-04-30 NOTE — Telephone Encounter (Signed)
Med required a Prior Auth.  I have contacted ins and taken care of this.  I called back.  They are aware.

## 2014-04-30 NOTE — Telephone Encounter (Signed)
Linda Oliver, pt's daughter called wanting to inform Dr. Jaynee Eagles that [facility] called patient stating that they can not send the script for Namzaric until  [facility] gets in touch with Dr. Cathren Laine office to confirm a couple of things. Pt would like for Dr. Jaynee Eagles or nurse to call [facility]. [facility] number is # [phone]. [name] [phone]

## 2014-05-01 ENCOUNTER — Ambulatory Visit: Payer: Medicare Other | Admitting: Physical Therapy

## 2014-05-01 DIAGNOSIS — R269 Unspecified abnormalities of gait and mobility: Secondary | ICD-10-CM | POA: Diagnosis not present

## 2014-05-01 DIAGNOSIS — R42 Dizziness and giddiness: Secondary | ICD-10-CM | POA: Diagnosis not present

## 2014-05-01 DIAGNOSIS — R29898 Other symptoms and signs involving the musculoskeletal system: Secondary | ICD-10-CM

## 2014-05-02 ENCOUNTER — Encounter: Payer: Self-pay | Admitting: Physical Therapy

## 2014-05-02 NOTE — Therapy (Signed)
[facility] [address] [place], [place], [address] Phone: [phone]   Fax:  [phone]  Physical Therapy Treatment  Patient Details  Name: Linda Oliver MRN: [id number] Date of Birth: 10/08/1932 Referring Provider:  Shon Baton, MD  Encounter Date: 05/01/2014      PT End of Session - 05/02/14 1234    Visit Number 12  G2   Number of Visits 17   Date for PT Re-Evaluation 05/18/14   Authorization Type Medicare   Authorization Time Period 03-19-14 - 05-18-14   PT Start Time 7106   PT Stop Time 1018   PT Time Calculation (min) 44 min      Past Medical History  Diagnosis Date  . CAD (coronary artery disease)     [place]   80% stenosis ostial Left main, 30% stenosis proximal LAD, 80% stenosis proximal RCA CABG  -  LIMA to LAD, SVG to int, SVG to [place], [place]   . Hypertensive heart disease   . Type 2 diabetes mellitus with vascular disease   . Bullous pemphigus   . Diabetic peripheral neuropathy   . GERD (gastroesophageal reflux disease)   . Hyperlipidemia   . Obesity (BMI 30-39.9)   . Anginal pain     occ  . Hypertension   . Sleep apnea     dx 74yrs ago doesnt use cpap but uses O2 at nite 2L  . Heart murmur     mvp  . Stones in the urinary tract   . UTI (lower urinary tract infection)   . Headache(784.0)     hx migraines  . Nasal bleeding     occ due to O2  . Peripheral neuropathy   . Osteopenia   . OA (osteoarthritis)   . Broken ankle   . Seizure     2009    Past Surgical History  Procedure Laterality Date  . Coronary artery bypass graft  96  . Cardiac catheterization    . Abdominal hysterectomy  70's  . Cholecystectomy    . Shoulder arthroscopy      rt  . Shoulder arthroscopy with rotator cuff repair and subacromial decompression  12/01/2011    Procedure: SHOULDER ARTHROSCOPY WITH ROTATOR CUFF REPAIR AND SUBACROMIAL DECOMPRESSION;  Surgeon: Nita Sells,  MD;  Location: [facility];  Service: Orthopedics;  Laterality: Left;  shoulder debridement calcific tendonitis    There were no vitals filed for this visit.  Visit Diagnosis:  Abnormality of gait  Weakness of left leg      Subjective Assessment - 05/02/14 1229    Subjective "Today is a bad day - my balance is not good today"   Pertinent History Peripheral neuropathy; Diabetes; HTN; Alzheimer's   Patient Stated Goals Improve balance and reduce the vertigo   Currently in Pain? No/denies                         [facility] Adult PT Treatment/Exercise - 05/02/14 0001    Ambulation/Gait   Ambulation/Gait --   Lumbar Exercises: Aerobic   Stationary Bike 5" SciFit level 1.5   Lumbar Exercises: Standing   Heel Raises 10 reps   Functional Squats 10 reps   Knee/Hip Exercises: Standing   Heel Raises 10 reps   Forward Step Up Both;10 reps   Step Down Both;10 reps;Step Height: 4"      Bil. Hip flexion, extension, abduction with green theraband x  10 reps each in standing         Balance Exercises - 05/02/14 1232    Balance Exercises: Standing   SLS Eyes open;3 reps;Solid surface   Sidestepping 2 reps  inside bars (10')     Stepping over and back of 1/2  [place] with UE assist prn with CGA Crossovers 10' x 4 reps inside bars with UE support prn with SBA       PT Short Term Goals - 04/21/14 1537    PT SHORT TERM GOAL #1   Baseline 10.5 secs           PT Long Term Goals - 04/25/14 1053    PT LONG TERM GOAL #1   Title Amb. 360' with cane with SBA without LOB on flat, even surface for incr. community accessibility.  (05-18-14)   Baseline met 04-22-14   Status Achieved   PT LONG TERM GOAL #2   Title Report at least 25% improvement in vertigo for safety with mobility.  (05-18-14)   Baseline 05-18-14 target date   Status On-going   PT LONG TERM GOAL #3   Title Improve TUG score to </= 14.5 secs with cane.  (05-18-14)   Baseline met 04-22-14   Status Achieved   PT LONG  TERM GOAL #4   Title Incr. gait velocity to >/= 2.1 ft/sec with cane for incr. gait efficiency.  (target date 05-18-14)   Baseline met 04-22-14   PT LONG TERM GOAL #5   Title Negotiate 4 steps without rail using a step over step sequence to demo incr. balance   Baseline target date 05-18-14   Time 3   Period Weeks   Status New   Additional Long Term Goals   Additional Long Term Goals Yes   PT LONG TERM GOAL #6   Title Amb. 65' with head turns side to side without device without LOB and with vertiog report of </= 2/10 intensity for improved balance and environmental scanning   Baseline 05-18-14 target date   Time 3   Period Weeks   Status New               Plan - 05/02/14 1234    Clinical Impression Statement Pt. presenting with incr/ unsteadiness today - pt. using cane; also noted to have some tremors in L quad with eccentric exercise of stepping down from 4" step   Pt will benefit from skilled therapeutic intervention in order to improve on the following deficits Abnormal gait;Decreased safety awareness;Impaired sensation;Decreased activity tolerance;Decreased balance;Decreased cognition;Decreased mobility;Decreased strength;Other (comment)   Rehab Potential Good   PT Frequency 2x / week   PT Duration 8 weeks   PT Treatment/Interventions ADLs/Self Care Home Management;Therapeutic activities;Patient/family education;Therapeutic exercise;Gait training;Balance training;Stair training;Neuromuscular re-education;Functional mobility training   PT Next Visit Plan cont gait and balance training   PT Home Exercise Plan balance HEP   Consulted and Agree with Plan of Care Patient        Problem List Patient Active Problem List   Diagnosis Date Noted  . Hereditary and idiopathic peripheral neuropathy 02/19/2014  . Cognitive decline 02/19/2014  . CAD (coronary artery disease)   . Type 2 diabetes mellitus with vascular disease   . Hyperlipidemia   . Hypertensive heart disease   .  Obesity (BMI 30-39.9)   . GERD (gastroesophageal reflux disease)   . Bullous pemphigus   . Diabetic peripheral neuropathy     Alda Lea, PT 05/02/2014, 12:38 PM  [facility]  [facility] [address] [place], [place], [address] Phone: [phone]   Fax:  [phone]

## 2014-05-05 ENCOUNTER — Ambulatory Visit: Payer: Medicare Other | Admitting: Physical Therapy

## 2014-05-05 ENCOUNTER — Encounter: Payer: Self-pay | Admitting: Physical Therapy

## 2014-05-05 DIAGNOSIS — R269 Unspecified abnormalities of gait and mobility: Secondary | ICD-10-CM | POA: Diagnosis not present

## 2014-05-05 DIAGNOSIS — R29898 Other symptoms and signs involving the musculoskeletal system: Secondary | ICD-10-CM

## 2014-05-05 DIAGNOSIS — R2689 Other abnormalities of gait and mobility: Secondary | ICD-10-CM

## 2014-05-05 DIAGNOSIS — R42 Dizziness and giddiness: Secondary | ICD-10-CM | POA: Diagnosis not present

## 2014-05-05 NOTE — Therapy (Signed)
[facility] [address] [place], [place], [address] Phone: [phone]   Fax:  [phone]  Physical Therapy Treatment  Patient Details  Name: Linda Oliver MRN: [id number] Date of Birth: Jul 27, 1932 Referring Provider:  Shon Baton, MD  Encounter Date: 05/05/2014      PT End of Session - 05/05/14 1508    Visit Number 13  G3   Number of Visits 17   Date for PT Re-Evaluation 05/18/14   Authorization Type Medicare   Authorization Time Period 03-19-14 - 05-18-14   PT Start Time 1147   PT Stop Time 1231   PT Time Calculation (min) 44 min   Equipment Utilized During Treatment Gait belt      Past Medical History  Diagnosis Date  . CAD (coronary artery disease)     [place]   80% stenosis ostial Left main, 30% stenosis proximal LAD, 80% stenosis proximal RCA CABG  -  LIMA to LAD, SVG to int, SVG to [place], [place]   . Hypertensive heart disease   . Type 2 diabetes mellitus with vascular disease   . Bullous pemphigus   . Diabetic peripheral neuropathy   . GERD (gastroesophageal reflux disease)   . Hyperlipidemia   . Obesity (BMI 30-39.9)   . Anginal pain     occ  . Hypertension   . Sleep apnea     dx 75yrs ago doesnt use cpap but uses O2 at nite 2L  . Heart murmur     mvp  . Stones in the urinary tract   . UTI (lower urinary tract infection)   . Headache(784.0)     hx migraines  . Nasal bleeding     occ due to O2  . Peripheral neuropathy   . Osteopenia   . OA (osteoarthritis)   . Broken ankle   . Seizure     2009    Past Surgical History  Procedure Laterality Date  . Coronary artery bypass graft  96  . Cardiac catheterization    . Abdominal hysterectomy  70's  . Cholecystectomy    . Shoulder arthroscopy      rt  . Shoulder arthroscopy with rotator cuff repair and subacromial decompression  12/01/2011    Procedure: SHOULDER ARTHROSCOPY WITH ROTATOR CUFF REPAIR AND SUBACROMIAL  DECOMPRESSION;  Surgeon: Nita Sells, MD;  Location: [facility];  Service: Orthopedics;  Laterality: Left;  shoulder debridement calcific tendonitis    There were no vitals filed for this visit.  Visit Diagnosis:  Abnormality of gait  Weakness of left leg  Balance problem      Subjective Assessment - 05/05/14 1504    Subjective "I feel better today but I'm using the cane today"   Pertinent History Peripheral neuropathy; Diabetes; HTN; Alzheimer's   Patient Stated Goals Improve balance and reduce the vertigo   Currently in Pain? No/denies                         [facility] Adult PT Treatment/Exercise - 05/05/14 1505    Transfers   Transfers Sit to Stand  on blue foam x 5 reps without UE support   Sit to Stand 5: Supervision   Lumbar Exercises: Standing   Heel Raises 10 reps   Functional Squats 10 reps  standing on BOSU inside bars   Knee/Hip Exercises: Machines for Strengthening   Cybex Leg Press 55# 3 sets of 10 reps bil.  LE's   Knee/Hip Exercises: Standing   Heel Raises 10 reps   Forward Step Up Both;10 reps     NeuroRe-ed;  Standing on BOSU - with minimal UE support (2 fingers of each hand) - with head turns for core stabilization  with SBA; squats on BOSU x 10 reps; stepping over and back of bolster with minimal UE support; standing on blue mat - forward kicks x 10 reps; tapping straight over seam line on mat and then crossovers to tap in front of Opposite foot x 10 reps each with CGA   TherEx:   SciFit level 2.0 x 5" with UE and LE's; bil. Hip extension and abduction with green theraband x 10 reps each LE           PT Short Term Goals - 04/21/14 1537    PT SHORT TERM GOAL #1   Baseline 10.5 secs           PT Long Term Goals - 04/25/14 1053    PT LONG TERM GOAL #1   Title Amb. 360' with cane with SBA without LOB on flat, even surface for incr. community accessibility.  (05-18-14)   Baseline met 04-22-14   Status Achieved   PT LONG  TERM GOAL #2   Title Report at least 25% improvement in vertigo for safety with mobility.  (05-18-14)   Baseline 05-18-14 target date   Status On-going   PT LONG TERM GOAL #3   Title Improve TUG score to </= 14.5 secs with cane.  (05-18-14)   Baseline met 04-22-14   Status Achieved   PT LONG TERM GOAL #4   Title Incr. gait velocity to >/= 2.1 ft/sec with cane for incr. gait efficiency.  (target date 05-18-14)   Baseline met 04-22-14   PT LONG TERM GOAL #5   Title Negotiate 4 steps without rail using a step over step sequence to demo incr. balance   Baseline target date 05-18-14   Time 3   Period Weeks   Status New   Additional Long Term Goals   Additional Long Term Goals Yes   PT LONG TERM GOAL #6   Title Amb. 49' with head turns side to side without device without LOB and with vertiog report of </= 2/10 intensity for improved balance and environmental scanning   Baseline 05-18-14 target date   Time 3   Period Weeks   Status New               Plan - 05/05/14 1509    Clinical Impression Statement Pt.'s balance better today than on 05-01-14; no left knee instability or tremors noted today during PT   Pt will benefit from skilled therapeutic intervention in order to improve on the following deficits Abnormal gait;Decreased safety awareness;Impaired sensation;Decreased activity tolerance;Decreased balance;Decreased cognition;Decreased mobility;Decreased strength;Other (comment)   Rehab Potential Good   PT Frequency 2x / week   PT Duration 8 weeks   PT Treatment/Interventions ADLs/Self Care Home Management;Therapeutic activities;Patient/family education;Therapeutic exercise;Gait training;Balance training;Stair training;Neuromuscular re-education;Functional mobility training   PT Next Visit Plan cont gait and balance training   PT Home Exercise Plan balance HEP   Consulted and Agree with Plan of Care Patient        Problem List Patient Active Problem List   Diagnosis Date Noted  .  Hereditary and idiopathic peripheral neuropathy 02/19/2014  . Cognitive decline 02/19/2014  . CAD (coronary artery disease)   . Type 2 diabetes mellitus with vascular disease   .  Hyperlipidemia   . Hypertensive heart disease   . Obesity (BMI 30-39.9)   . GERD (gastroesophageal reflux disease)   . Bullous pemphigus   . Diabetic peripheral neuropathy     Alda Lea, PT 05/05/2014, 3:11 PM  [facility] [address] [place], [place], [address] Phone: [phone]   Fax:  [phone]

## 2014-05-12 ENCOUNTER — Ambulatory Visit: Payer: Medicare Other | Admitting: Physical Therapy

## 2014-05-12 DIAGNOSIS — I2510 Atherosclerotic heart disease of native coronary artery without angina pectoris: Secondary | ICD-10-CM | POA: Diagnosis not present

## 2014-05-12 DIAGNOSIS — I10 Essential (primary) hypertension: Secondary | ICD-10-CM | POA: Diagnosis not present

## 2014-05-12 DIAGNOSIS — R0989 Other specified symptoms and signs involving the circulatory and respiratory systems: Secondary | ICD-10-CM | POA: Diagnosis not present

## 2014-05-12 DIAGNOSIS — I951 Orthostatic hypotension: Secondary | ICD-10-CM | POA: Diagnosis not present

## 2014-05-15 ENCOUNTER — Ambulatory Visit: Payer: Medicare Other | Attending: Otolaryngology | Admitting: Physical Therapy

## 2014-05-15 DIAGNOSIS — R42 Dizziness and giddiness: Secondary | ICD-10-CM | POA: Diagnosis not present

## 2014-05-15 DIAGNOSIS — R269 Unspecified abnormalities of gait and mobility: Secondary | ICD-10-CM | POA: Diagnosis not present

## 2014-05-15 DIAGNOSIS — R2689 Other abnormalities of gait and mobility: Secondary | ICD-10-CM

## 2014-05-16 ENCOUNTER — Encounter: Payer: Self-pay | Admitting: Physical Therapy

## 2014-05-16 NOTE — Therapy (Signed)
[facility] [address] [place], [place], [address] Phone: [phone]   Fax:  [phone]  Physical Therapy Treatment  Patient Details  Name: ROZA CREAMER MRN: [id number] Date of Birth: 08/30/1932 Referring Provider:  Creola Corn, MD  Encounter Date: 05/15/2014      PT End of Session - 05/16/14 0937    Visit Number 14   Number of Visits 17   Date for PT Re-Evaluation 05/18/14   Authorization Type Medicare   Authorization Time Period 03-19-14 - 05-18-14   PT Start Time 1316   PT Stop Time 1402   PT Time Calculation (min) 46 min      Past Medical History  Diagnosis Date  . CAD (coronary artery disease)     Cath 1996  [place]   80% stenosis ostial Left main, 30% stenosis proximal LAD, 80% stenosis proximal RCA CABG  -  LIMA to LAD, SVG to int, SVG to RCA 1996 [place], [place]   . Hypertensive heart disease   . Type 2 diabetes mellitus with vascular disease   . Bullous pemphigus   . Diabetic peripheral neuropathy   . GERD (gastroesophageal reflux disease)   . Hyperlipidemia   . Obesity (BMI 30-39.9)   . Anginal pain     occ  . Hypertension   . Sleep apnea     dx 59yrs ago doesnt use cpap but uses O2 at nite 2L  . Heart murmur     mvp  . Stones in the urinary tract   . UTI (lower urinary tract infection)   . Headache(784.0)     hx migraines  . Nasal bleeding     occ due to O2  . Peripheral neuropathy   . Osteopenia   . OA (osteoarthritis)   . Broken ankle   . Seizure     2009    Past Surgical History  Procedure Laterality Date  . Coronary artery bypass graft  96  . Cardiac catheterization    . Abdominal hysterectomy  70's  . Cholecystectomy    . Shoulder arthroscopy      rt  . Shoulder arthroscopy with rotator cuff repair and subacromial decompression  12/01/2011    Procedure: SHOULDER ARTHROSCOPY WITH ROTATOR CUFF REPAIR AND SUBACROMIAL DECOMPRESSION;  Surgeon: Mable Paris, MD;   Location: [facility];  Service: Orthopedics;  Laterality: Left;  shoulder debridement calcific tendonitis    There were no vitals filed for this visit.  Visit Diagnosis:  Abnormality of gait  Balance problem      Subjective Assessment - 05/16/14 0931    Subjective "I'm doing great"   "ready to finish up today"   Pertinent History Peripheral neuropathy; Diabetes; HTN; Alzheimer's   Patient Stated Goals Improve balance and reduce the vertigo   Currently in Pain? No/denies        NeuroRe-ed:  TUG score 8.6 secs with no device;  Gait velocity 3.11 ft/sec with no device Gait;  Amb. 50' with no device with horizontal head turns with S without LOB with no c/o vertigo; step training without use of rail  using a step over step sequence for balance testing purpose only  TherEX:  Leg press 50# bil. LE's 3 sets 10 reps Nustep level 3 x 5" Self Care; discussed progress, LTG's,  and pt was given info on [employer] and informed of [employer] newest location For possibilities of communtiy exercise fitness programs; reviewed HEP - pt. Reports compliance  PT Education - 05/16/14 0935    Education provided Yes   Education Details pt. given info on [place] for continuation of exercise program in addition to HEP   Person(s) Educated Patient   Methods Explanation;Demonstration;Handout   Comprehension Verbalized understanding;Returned demonstration          PT Short Term Goals - 04/21/14 1537    PT SHORT TERM GOAL #1   Baseline 10.5 secs           PT Long Term Goals - 05/16/14 0933    PT LONG TERM GOAL #1   Title Amb. 360' with cane with SBA without LOB on flat, even surface for incr. community accessibility.  (05-18-14)   Baseline met 04-22-14   Status Achieved   PT LONG TERM GOAL #2   Title Report at least 25% improvement in vertigo for safety with mobility.  (05-18-14)   Baseline 05-18-14 target date   Status Achieved   PT LONG  TERM GOAL #3   Title Improve TUG score to </= 14.5 secs with cane.  (05-18-14)   Baseline 8.6 secs   Status Achieved   PT LONG TERM GOAL #4   Title Incr. gait velocity to >/= 2.1 ft/sec with cane for incr. gait efficiency.  (target date 05-18-14)   Baseline 10.53 secs = 3.11 ft/sec with no device   Status Achieved   PT LONG TERM GOAL #5   Title Negotiate 4 steps without rail using a step over step sequence to demo incr. balance   Baseline target date 05-18-14   Status Achieved   PT LONG TERM GOAL #6   Title Amb. 56' with head turns side to side without device without LOB and with vertiog report of </= 2/10 intensity for improved balance and environmental scanning   Baseline 05-18-14 target date   Status Achieved               Plan - May 28, 2014 0938    Clinical Impression Statement Pt. has made excellent progress - met 6/6 LTG's - ready for discharge due to mobility is now WNL's   Pt will benefit from skilled therapeutic intervention in order to improve on the following deficits Abnormal gait;Decreased safety awareness;Impaired sensation;Decreased activity tolerance;Decreased balance;Decreased cognition;Decreased mobility;Decreased strength;Other (comment)   Rehab Potential Good   PT Treatment/Interventions ADLs/Self Care Home Management;Therapeutic activities;Patient/family education;Therapeutic exercise;Gait training;Balance training;Stair training;Neuromuscular re-education;Functional mobility training   PT Home Exercise Plan HEP with possibly joining [place] for exercise gym and use of equipment   Consulted and Agree with Plan of Care Patient          G-Codes - 05/28/2014 0939    Functional Assessment Tool Used TUG 8.6 secs; gait velocity 3.11 ft/sec   Functional Limitation Mobility   Mobility: Walking and Moving Around Goal Status ([id number]) CI   Mobility: Walking and Moving Around Discharge Status [phone]) CI      Problem List Patient Active Problem List   Diagnosis Date  Noted  . Hereditary and idiopathic peripheral neuropathy 02/19/2014  . Cognitive decline 02/19/2014  . CAD (coronary artery disease)   . Type 2 diabetes mellitus with vascular disease   . Hyperlipidemia   . Hypertensive heart disease   . Obesity (BMI 30-39.9)   . GERD (gastroesophageal reflux disease)   . Bullous pemphigus   . Diabetic peripheral neuropathy   PHYSICAL THERAPY DISCHARGE SUMMARY  Visits from Start of Care:  14  Current functional level related to goals / functional outcomes: See  above for progress towards LTG's   Remaining deficits: Intermittent c/o dizziness and weakness which pt. States fluctuates in occurrence and intensity   Education / Equipment: Pt. Has been instructed in a HEP consisting of balance exercises and given info on [employer] for joining gym for community fitness option. Plan: Patient agrees to discharge.  Patient goals were met. Patient is being discharged due to meeting the stated rehab goals.  ?????       Alda Lea, PT 05/16/2014, 10:11 AM  [facility] [address] [place], [place], [address] Phone: [phone]   Fax:  [phone]

## 2014-06-06 DIAGNOSIS — R269 Unspecified abnormalities of gait and mobility: Secondary | ICD-10-CM | POA: Diagnosis not present

## 2014-06-06 DIAGNOSIS — R413 Other amnesia: Secondary | ICD-10-CM | POA: Diagnosis not present

## 2014-06-06 DIAGNOSIS — E119 Type 2 diabetes mellitus without complications: Secondary | ICD-10-CM | POA: Diagnosis not present

## 2014-06-06 DIAGNOSIS — E1165 Type 2 diabetes mellitus with hyperglycemia: Secondary | ICD-10-CM | POA: Diagnosis not present

## 2014-06-06 DIAGNOSIS — I2510 Atherosclerotic heart disease of native coronary artery without angina pectoris: Secondary | ICD-10-CM | POA: Diagnosis not present

## 2014-06-06 DIAGNOSIS — G629 Polyneuropathy, unspecified: Secondary | ICD-10-CM | POA: Diagnosis not present

## 2014-06-06 DIAGNOSIS — Z6828 Body mass index (BMI) 28.0-28.9, adult: Secondary | ICD-10-CM | POA: Diagnosis not present

## 2014-06-06 DIAGNOSIS — I10 Essential (primary) hypertension: Secondary | ICD-10-CM | POA: Diagnosis not present

## 2014-06-06 DIAGNOSIS — E669 Obesity, unspecified: Secondary | ICD-10-CM | POA: Diagnosis not present

## 2014-06-06 DIAGNOSIS — E785 Hyperlipidemia, unspecified: Secondary | ICD-10-CM | POA: Diagnosis not present

## 2014-07-08 DIAGNOSIS — M6281 Muscle weakness (generalized): Secondary | ICD-10-CM | POA: Diagnosis not present

## 2014-07-08 DIAGNOSIS — R260 Ataxic gait: Secondary | ICD-10-CM | POA: Diagnosis not present

## 2014-07-08 DIAGNOSIS — H8113 Benign paroxysmal vertigo, bilateral: Secondary | ICD-10-CM | POA: Diagnosis not present

## 2014-07-08 DIAGNOSIS — R296 Repeated falls: Secondary | ICD-10-CM | POA: Diagnosis not present

## 2014-07-15 DIAGNOSIS — M6281 Muscle weakness (generalized): Secondary | ICD-10-CM | POA: Diagnosis not present

## 2014-07-15 DIAGNOSIS — H8113 Benign paroxysmal vertigo, bilateral: Secondary | ICD-10-CM | POA: Diagnosis not present

## 2014-07-15 DIAGNOSIS — R260 Ataxic gait: Secondary | ICD-10-CM | POA: Diagnosis not present

## 2014-07-15 DIAGNOSIS — R296 Repeated falls: Secondary | ICD-10-CM | POA: Diagnosis not present

## 2014-07-17 DIAGNOSIS — R296 Repeated falls: Secondary | ICD-10-CM | POA: Diagnosis not present

## 2014-07-17 DIAGNOSIS — R260 Ataxic gait: Secondary | ICD-10-CM | POA: Diagnosis not present

## 2014-07-17 DIAGNOSIS — H8113 Benign paroxysmal vertigo, bilateral: Secondary | ICD-10-CM | POA: Diagnosis not present

## 2014-07-17 DIAGNOSIS — M6281 Muscle weakness (generalized): Secondary | ICD-10-CM | POA: Diagnosis not present

## 2014-07-18 ENCOUNTER — Other Ambulatory Visit: Payer: Self-pay | Admitting: Neurology

## 2014-07-22 DIAGNOSIS — H8113 Benign paroxysmal vertigo, bilateral: Secondary | ICD-10-CM | POA: Diagnosis not present

## 2014-07-22 DIAGNOSIS — R296 Repeated falls: Secondary | ICD-10-CM | POA: Diagnosis not present

## 2014-07-22 DIAGNOSIS — R260 Ataxic gait: Secondary | ICD-10-CM | POA: Diagnosis not present

## 2014-07-22 DIAGNOSIS — M6281 Muscle weakness (generalized): Secondary | ICD-10-CM | POA: Diagnosis not present

## 2014-07-24 DIAGNOSIS — R260 Ataxic gait: Secondary | ICD-10-CM | POA: Diagnosis not present

## 2014-07-24 DIAGNOSIS — R296 Repeated falls: Secondary | ICD-10-CM | POA: Diagnosis not present

## 2014-07-24 DIAGNOSIS — M6281 Muscle weakness (generalized): Secondary | ICD-10-CM | POA: Diagnosis not present

## 2014-07-24 DIAGNOSIS — H8113 Benign paroxysmal vertigo, bilateral: Secondary | ICD-10-CM | POA: Diagnosis not present

## 2014-07-29 DIAGNOSIS — H8113 Benign paroxysmal vertigo, bilateral: Secondary | ICD-10-CM | POA: Diagnosis not present

## 2014-07-29 DIAGNOSIS — R260 Ataxic gait: Secondary | ICD-10-CM | POA: Diagnosis not present

## 2014-07-29 DIAGNOSIS — M6281 Muscle weakness (generalized): Secondary | ICD-10-CM | POA: Diagnosis not present

## 2014-07-29 DIAGNOSIS — R296 Repeated falls: Secondary | ICD-10-CM | POA: Diagnosis not present

## 2014-07-31 DIAGNOSIS — R260 Ataxic gait: Secondary | ICD-10-CM | POA: Diagnosis not present

## 2014-07-31 DIAGNOSIS — M6281 Muscle weakness (generalized): Secondary | ICD-10-CM | POA: Diagnosis not present

## 2014-07-31 DIAGNOSIS — H8113 Benign paroxysmal vertigo, bilateral: Secondary | ICD-10-CM | POA: Diagnosis not present

## 2014-07-31 DIAGNOSIS — R296 Repeated falls: Secondary | ICD-10-CM | POA: Diagnosis not present

## 2014-08-05 DIAGNOSIS — M6281 Muscle weakness (generalized): Secondary | ICD-10-CM | POA: Diagnosis not present

## 2014-08-05 DIAGNOSIS — R296 Repeated falls: Secondary | ICD-10-CM | POA: Diagnosis not present

## 2014-08-05 DIAGNOSIS — H8113 Benign paroxysmal vertigo, bilateral: Secondary | ICD-10-CM | POA: Diagnosis not present

## 2014-08-05 DIAGNOSIS — R260 Ataxic gait: Secondary | ICD-10-CM | POA: Diagnosis not present

## 2014-08-12 DIAGNOSIS — R260 Ataxic gait: Secondary | ICD-10-CM | POA: Diagnosis not present

## 2014-08-12 DIAGNOSIS — R296 Repeated falls: Secondary | ICD-10-CM | POA: Diagnosis not present

## 2014-08-12 DIAGNOSIS — M6281 Muscle weakness (generalized): Secondary | ICD-10-CM | POA: Diagnosis not present

## 2014-08-12 DIAGNOSIS — H8113 Benign paroxysmal vertigo, bilateral: Secondary | ICD-10-CM | POA: Diagnosis not present

## 2014-08-14 DIAGNOSIS — M6281 Muscle weakness (generalized): Secondary | ICD-10-CM | POA: Diagnosis not present

## 2014-08-14 DIAGNOSIS — R296 Repeated falls: Secondary | ICD-10-CM | POA: Diagnosis not present

## 2014-08-14 DIAGNOSIS — R260 Ataxic gait: Secondary | ICD-10-CM | POA: Diagnosis not present

## 2014-08-14 DIAGNOSIS — H8113 Benign paroxysmal vertigo, bilateral: Secondary | ICD-10-CM | POA: Diagnosis not present

## 2014-08-15 DIAGNOSIS — I10 Essential (primary) hypertension: Secondary | ICD-10-CM | POA: Diagnosis not present

## 2014-08-15 DIAGNOSIS — I2510 Atherosclerotic heart disease of native coronary artery without angina pectoris: Secondary | ICD-10-CM | POA: Diagnosis not present

## 2014-08-19 DIAGNOSIS — M6281 Muscle weakness (generalized): Secondary | ICD-10-CM | POA: Diagnosis not present

## 2014-08-19 DIAGNOSIS — R296 Repeated falls: Secondary | ICD-10-CM | POA: Diagnosis not present

## 2014-08-19 DIAGNOSIS — H8113 Benign paroxysmal vertigo, bilateral: Secondary | ICD-10-CM | POA: Diagnosis not present

## 2014-08-19 DIAGNOSIS — R260 Ataxic gait: Secondary | ICD-10-CM | POA: Diagnosis not present

## 2014-08-20 ENCOUNTER — Ambulatory Visit: Payer: Medicare Other | Admitting: Neurology

## 2014-08-21 DIAGNOSIS — R296 Repeated falls: Secondary | ICD-10-CM | POA: Diagnosis not present

## 2014-08-21 DIAGNOSIS — R260 Ataxic gait: Secondary | ICD-10-CM | POA: Diagnosis not present

## 2014-08-21 DIAGNOSIS — H8113 Benign paroxysmal vertigo, bilateral: Secondary | ICD-10-CM | POA: Diagnosis not present

## 2014-08-21 DIAGNOSIS — M6281 Muscle weakness (generalized): Secondary | ICD-10-CM | POA: Diagnosis not present

## 2014-08-25 ENCOUNTER — Ambulatory Visit: Payer: Self-pay | Admitting: Neurology

## 2014-08-25 ENCOUNTER — Telehealth: Payer: Self-pay | Admitting: *Deleted

## 2014-08-25 NOTE — Telephone Encounter (Signed)
No showed f/u appt.

## 2014-08-26 ENCOUNTER — Encounter: Payer: Self-pay | Admitting: Neurology

## 2014-08-26 DIAGNOSIS — H8113 Benign paroxysmal vertigo, bilateral: Secondary | ICD-10-CM | POA: Diagnosis not present

## 2014-08-26 DIAGNOSIS — R296 Repeated falls: Secondary | ICD-10-CM | POA: Diagnosis not present

## 2014-08-26 DIAGNOSIS — R260 Ataxic gait: Secondary | ICD-10-CM | POA: Diagnosis not present

## 2014-08-26 DIAGNOSIS — M6281 Muscle weakness (generalized): Secondary | ICD-10-CM | POA: Diagnosis not present

## 2014-08-28 DIAGNOSIS — R296 Repeated falls: Secondary | ICD-10-CM | POA: Diagnosis not present

## 2014-08-28 DIAGNOSIS — R260 Ataxic gait: Secondary | ICD-10-CM | POA: Diagnosis not present

## 2014-08-28 DIAGNOSIS — M6281 Muscle weakness (generalized): Secondary | ICD-10-CM | POA: Diagnosis not present

## 2014-08-28 DIAGNOSIS — H8113 Benign paroxysmal vertigo, bilateral: Secondary | ICD-10-CM | POA: Diagnosis not present

## 2014-09-02 DIAGNOSIS — R260 Ataxic gait: Secondary | ICD-10-CM | POA: Diagnosis not present

## 2014-09-02 DIAGNOSIS — R296 Repeated falls: Secondary | ICD-10-CM | POA: Diagnosis not present

## 2014-09-02 DIAGNOSIS — M6281 Muscle weakness (generalized): Secondary | ICD-10-CM | POA: Diagnosis not present

## 2014-09-02 DIAGNOSIS — H8113 Benign paroxysmal vertigo, bilateral: Secondary | ICD-10-CM | POA: Diagnosis not present

## 2014-09-04 DIAGNOSIS — R296 Repeated falls: Secondary | ICD-10-CM | POA: Diagnosis not present

## 2014-09-04 DIAGNOSIS — R260 Ataxic gait: Secondary | ICD-10-CM | POA: Diagnosis not present

## 2014-09-04 DIAGNOSIS — H8113 Benign paroxysmal vertigo, bilateral: Secondary | ICD-10-CM | POA: Diagnosis not present

## 2014-09-04 DIAGNOSIS — M6281 Muscle weakness (generalized): Secondary | ICD-10-CM | POA: Diagnosis not present

## 2014-09-09 DIAGNOSIS — H8113 Benign paroxysmal vertigo, bilateral: Secondary | ICD-10-CM | POA: Diagnosis not present

## 2014-09-09 DIAGNOSIS — R260 Ataxic gait: Secondary | ICD-10-CM | POA: Diagnosis not present

## 2014-09-09 DIAGNOSIS — R296 Repeated falls: Secondary | ICD-10-CM | POA: Diagnosis not present

## 2014-09-09 DIAGNOSIS — M6281 Muscle weakness (generalized): Secondary | ICD-10-CM | POA: Diagnosis not present

## 2014-09-11 DIAGNOSIS — H8113 Benign paroxysmal vertigo, bilateral: Secondary | ICD-10-CM | POA: Diagnosis not present

## 2014-09-11 DIAGNOSIS — R296 Repeated falls: Secondary | ICD-10-CM | POA: Diagnosis not present

## 2014-09-11 DIAGNOSIS — M6281 Muscle weakness (generalized): Secondary | ICD-10-CM | POA: Diagnosis not present

## 2014-09-11 DIAGNOSIS — R260 Ataxic gait: Secondary | ICD-10-CM | POA: Diagnosis not present

## 2014-09-16 DIAGNOSIS — Z6829 Body mass index (BMI) 29.0-29.9, adult: Secondary | ICD-10-CM | POA: Diagnosis not present

## 2014-09-16 DIAGNOSIS — E1149 Type 2 diabetes mellitus with other diabetic neurological complication: Secondary | ICD-10-CM | POA: Diagnosis not present

## 2014-09-16 DIAGNOSIS — R509 Fever, unspecified: Secondary | ICD-10-CM | POA: Diagnosis not present

## 2014-09-16 DIAGNOSIS — R05 Cough: Secondary | ICD-10-CM | POA: Diagnosis not present

## 2014-09-23 DIAGNOSIS — R296 Repeated falls: Secondary | ICD-10-CM | POA: Diagnosis not present

## 2014-09-23 DIAGNOSIS — H8113 Benign paroxysmal vertigo, bilateral: Secondary | ICD-10-CM | POA: Diagnosis not present

## 2014-09-23 DIAGNOSIS — M6281 Muscle weakness (generalized): Secondary | ICD-10-CM | POA: Diagnosis not present

## 2014-09-23 DIAGNOSIS — R260 Ataxic gait: Secondary | ICD-10-CM | POA: Diagnosis not present

## 2014-09-24 ENCOUNTER — Telehealth: Payer: Self-pay | Admitting: *Deleted

## 2014-09-24 NOTE — Telephone Encounter (Signed)
Called and spoke with pt to let her know we needed to reschedule her appt for tomorrow. Dr. Jaynee Eagles will not be in the office at this time. Offered her a 415pm appt but she has physical therapy right before and will not be able to make it. She could not reschedule appt at this time because she was about to go into church. She asked me to call her in the morning to reschedule.

## 2014-09-25 ENCOUNTER — Ambulatory Visit: Payer: Medicare Other | Admitting: Neurology

## 2014-09-25 DIAGNOSIS — M6281 Muscle weakness (generalized): Secondary | ICD-10-CM | POA: Diagnosis not present

## 2014-09-25 DIAGNOSIS — R260 Ataxic gait: Secondary | ICD-10-CM | POA: Diagnosis not present

## 2014-09-25 DIAGNOSIS — R296 Repeated falls: Secondary | ICD-10-CM | POA: Diagnosis not present

## 2014-09-25 DIAGNOSIS — H8113 Benign paroxysmal vertigo, bilateral: Secondary | ICD-10-CM | POA: Diagnosis not present

## 2014-09-25 NOTE — Telephone Encounter (Signed)
Called pt and LVM to let her know I was calling about rescheduling her appointment from today. Told her there was an opening at 11:15pm on Monday and to call me back if this time does not work, but I will put her there to give her an appointment. Gave [facility] phone number.

## 2014-09-29 ENCOUNTER — Ambulatory Visit: Payer: Self-pay | Admitting: Neurology

## 2014-09-30 NOTE — Telephone Encounter (Signed)
Checked schedule. Pt rescheduled for 10/06/14 at 230pm.

## 2014-10-02 ENCOUNTER — Telehealth: Payer: Self-pay

## 2014-10-02 NOTE — Telephone Encounter (Signed)
I spoke to patient and asked to move her to [facility] schedule. Linda Oliver stated that she only wanted to see a doctor. Next opening is 10/3 and she did not want to wait that long for appt. Please advise. (I am off tomorrow 9/23)

## 2014-10-03 NOTE — Telephone Encounter (Signed)
i am happy to still see her on Monday thanks. Terrence Dupont, let her know please

## 2014-10-06 ENCOUNTER — Ambulatory Visit (INDEPENDENT_AMBULATORY_CARE_PROVIDER_SITE_OTHER): Payer: Medicare Other | Admitting: Neurology

## 2014-10-06 ENCOUNTER — Encounter: Payer: Self-pay | Admitting: Neurology

## 2014-10-06 VITALS — BP 121/59 | HR 64 | Ht 61.0 in | Wt 164.2 lb

## 2014-10-06 DIAGNOSIS — G3184 Mild cognitive impairment, so stated: Secondary | ICD-10-CM

## 2014-10-06 MED ORDER — NAMZARIC 28-10 MG PO CP24: 1.0000 | ORAL_CAPSULE | Freq: Every day | ORAL | Status: DC

## 2014-10-06 NOTE — Telephone Encounter (Signed)
Called pt to let her know to still come for her appt with Dr. Jaynee Eagles at 230pm. Check in at 215pm. She verbalized understanding.

## 2014-10-06 NOTE — Progress Notes (Signed)
[facility]    CC: Memory loss  Interval History 10/06/2014: She is doing great. The medication really works for her. She takes oral B12 daily. No side effects from the Namzaric. Feels her memory is stable, no progression. No behavioral symptoms. Some mild anxiety but nothing significant. She is still driving and goes to therapy. She still has some dizziness and is in therapy. She has a recumbent bike that she uses. She is on gabapentin and cymbalta for neuropathy. She is here with her daughter.  Interval update 02/19/2014: Linda Oliver is a 81 y.o. female here as a follow up. She is a former patient of Dr. Janann Colonel. Has history of DM, HLD, HTN. Has neuropathy for which she takes Cymbalta and Gabapentin. Has occasional episodes of dizziness which she takes meclizine for. No known family history of cognitive decline. Has been told her B12 is low and has received injections in the past, currently taking oral B12 daily. She feels great, feels Aricept is making a significant difference. No side effects from the medication. She has decreased her Neurontin since starting Cymbalta and would like to try and decrease further.   10/09/2013 visit: Linda Oliver is a 81 y.o. female here as a follow up from Dr. Virgina Jock for cognitive decline. Initial visit was 03/2013 at which time she had an unremarkable brain MRI and lab workup. She was started on Aricept 5mg  daily. Returns today with continued concerns over short term memory. Patient notes continued difficulty with getting lost, has a hard time getting around. She will have good and bad days, some days she is more confused and disoriented. Has gotten lost driving a few times. Her daughter notes some improvement since being on the medication. She feels that the patient is more alert and interactive. Notes some gait instability and dizziness but no falls.   Initial visit 04/09/2013: Daughter notes symptoms have been ongoing for a few years and have  gotten progressively worse. Daughter notices she will be telling a story and then just forget what comes next. Trouble with short term memory, difficulty recalling what they just talked about. Currently driving, denies any difficulty, no accidents. Has noted some episodes of getting lost. Wakes up frequently overnight with need to urinate. She notes one episode of a visual hallucination, described as a hazy shadow. Notes some difficulty with remote memory but overall feels that is still intact.   She currently lives alone, her daughter takes care of finances and medications, has been doing this for the past few years. Has been hospitalized a few times in the past with question of stroke, no stroke noted and daughter suspects patients may have been managing her medications incorrectly in the past.     Review of Systems: Patient complains of symptoms per HPI as well as the following symptoms: Excessive thirst, runny nose, leg swelling, palpitations, restless leg, incontinence of bladder, walking difficulty, itching, dizziness, anxiety Pertinent negatives per HPI. All others negative.    Social History   Social History  . Marital Status: Married    Spouse Name: N/A  . Number of Children: N/A  . Years of Education: N/A   Occupational History  . Not on file.   Social History Main Topics  . Smoking status: Former Smoker -- 1.00 packs/day for 30 years    Types: Cigarettes    Quit date: 11/25/1986  . Smokeless tobacco: Not on file  . Alcohol Use: No  . Drug Use: No  . Sexual Activity: Yes  Birth Control/ Protection: Post-menopausal   Other Topics Concern  . Not on file   Social History Narrative   Married, has 1 child    Family History  Problem Relation Age of Onset  . Cancer Brother   . Heart disease Father   . Cancer Father     skin  . Coronary artery disease Mother   . Cancer - Colon Brother     brain, Lymphoma    Past Medical History  Diagnosis Date  . CAD (coronary  artery disease)     [place]   80% stenosis ostial Left main, 30% stenosis proximal LAD, 80% stenosis proximal RCA CABG  -  LIMA to LAD, SVG to int, SVG to [place], [place]   . Hypertensive heart disease   . Type 2 diabetes mellitus with vascular disease   . Bullous pemphigus   . Diabetic peripheral neuropathy   . GERD (gastroesophageal reflux disease)   . Hyperlipidemia   . Obesity (BMI 30-39.9)   . Anginal pain     occ  . Hypertension   . Sleep apnea     dx 10yrs ago doesnt use cpap but uses O2 at nite 2L  . Heart murmur     mvp  . Stones in the urinary tract   . UTI (lower urinary tract infection)   . Headache(784.0)     hx migraines  . Nasal bleeding     occ due to O2  . Peripheral neuropathy   . Osteopenia   . OA (osteoarthritis)   . Broken ankle   . Seizure     2009    Past Surgical History  Procedure Laterality Date  . Coronary artery bypass graft  96  . Cardiac catheterization    . Abdominal hysterectomy  70's  . Cholecystectomy    . Shoulder arthroscopy      rt  . Shoulder arthroscopy with rotator cuff repair and subacromial decompression  12/01/2011    Procedure: SHOULDER ARTHROSCOPY WITH ROTATOR CUFF REPAIR AND SUBACROMIAL DECOMPRESSION;  Surgeon: Nita Sells, MD;  Location: [facility];  Service: Orthopedics;  Laterality: Left;  shoulder debridement calcific tendonitis    Current Outpatient Prescriptions  Medication Sig Dispense Refill  . aspirin EC 325 MG tablet Take 325 mg by mouth daily.    . busPIRone (BUSPAR) 5 MG tablet Take 5 mg by mouth 2 (two) times daily.    . Cyanocobalamin (VITAMIN B-12 PO) Take 325 mg by mouth daily.    . DULoxetine (CYMBALTA) 30 MG capsule Take 1 capsule (30 mg total) by mouth daily. 30 capsule 6  . fesoterodine (TOVIAZ) 8 MG TB24 tablet Take 8 mg by mouth daily.    Marland Kitchen gabapentin (NEURONTIN) 600 MG tablet Take 600 mg by mouth 2 (two) times daily. Takes 1 1/2 tabs daily    . glucose blood test strip  1 each by Other route as needed for other. Use as instructed    . hydrOXYzine (ATARAX/VISTARIL) 25 MG tablet Take 25 mg by mouth 2 (two) times daily as needed. For itching    . Insulin Glargine (LANTUS SOLOSTAR) 100 UNIT/ML Solostar Pen Inject 17 Units into the skin daily at 10 pm.     . Insulin Pen Needle (B-D UF III MINI PEN NEEDLES) 31G X 5 MM MISC by Does not apply route.    . metoprolol succinate (TOPROL-XL) 25 MG 24 hr tablet Take 12.5 mg by mouth 2 (two) times daily.    Marland Kitchen  NAMZARIC 28-10 MG CP24 Take 1 capsule by mouth daily. 90 capsule 3  . pantoprazole (PROTONIX) 40 MG tablet Take 40 mg by mouth daily.    . pregabalin (LYRICA) 50 MG capsule Take 50 mg by mouth daily.    . rosuvastatin (CRESTOR) 10 MG tablet Take 10 mg by mouth daily.    . sitaGLIPtin (JANUVIA) 100 MG tablet Take 100 mg by mouth daily.    . valsartan-hydrochlorothiazide (DIOVAN-HCT) 160-25 MG per tablet Take 0.5 tablets by mouth daily.     No current facility-administered medications for this visit.    Allergies as of 10/06/2014 - Review Complete 10/06/2014  Allergen Reaction Noted  . Percocet [oxycodone-acetaminophen] Nausea And Vomiting 09/30/2011  . Latex Itching and Rash 11/22/2011    Vitals: BP 121/59 mmHg  Pulse 64  Ht 5\' 1"  (1.549 m)  Wt 164 lb 3.2 oz (74.481 kg)  BMI 31.04 kg/m2 Last Weight:  Wt Readings from Last 1 Encounters:  10/06/14 164 lb 3.2 oz (74.481 kg)   Last Height:   Ht Readings from Last 1 Encounters:  10/06/14 5\' 1"  (1.549 m)    Neuro: Detailed Neurologic Exam  Speech:  Speech is normal; fluent and spontaneous with normal comprehension.  Cognition:MS:  MoCA 24/30 improved from 17/30 at initial visit Cranial Nerves:  The pupils are equal, round, and reactive to light. Visual fields are full to finger confrontation. Extraocular movements are intact. Trigeminal sensation is intact and the muscles of mastication are normal. The face is symmetric. The palate elevates in the  midline. Hearing intact. Voice is normal. Shoulder shrug is normal. The tongue has normal motion without fasciculations.   Motor Observation:  no involuntary movements noted. Tone:  Normal muscle tone.   Posture:  Posture is normal. normal erect   Strength:  Strength is V/V in the upper and lower limbs.      Assessment/Plan:82woman presenting for follow up evaluation of cognitive decline. Patient and daughter note it is predominantly a problem with short term memory and they feel it has been getting progressively better with Namzeric. MRI brain and lab workup unremarkable.   As far as your medications are concerned, I would like to suggest: Continue Namzeric for cognitive decline and cymbalta/neurontin for neuropathy  F/u one year    Sarina Ill, MD  [facility] [address] [place], [place] [address]  Phone [phone] Fax [phone]  A total of 15 minutes was spent face-to-face with this patient. Over half this time was spent on counseling patient on the mild cognitive impairment diagnosis and different diagnostic and therapeutic options available.

## 2014-10-06 NOTE — Patient Instructions (Signed)
Overall you are doing great but I do want to suggest a few things today:   Remember to drink plenty of fluid, eat healthy meals and do not skip any meals. Try to eat protein with a every meal and eat a healthy snack such as fruit or nuts in between meals. Try to keep a regular sleep-wake schedule and try to exercise daily, particularly in the form of walking, 20-30 minutes a day, if you can.   As far as your medications are concerned, I would like to suggest: continue current medications  I would like to see you back in 1 year, sooner if we need to. Please call us with any interim questions, concerns, problems, updates or refill requests.   Please also call us for any test results so we can go over those with you on the phone.  My clinical assistant and will answer any of your questions and relay your messages to me and also relay most of my messages to you.   Our phone number is [phone]. We also have an after hours call service for urgent matters and there is a physician on-call for urgent questions. For any emergencies you know to call 911 or go to the nearest emergency room

## 2014-10-07 DIAGNOSIS — R260 Ataxic gait: Secondary | ICD-10-CM | POA: Diagnosis not present

## 2014-10-07 DIAGNOSIS — H8113 Benign paroxysmal vertigo, bilateral: Secondary | ICD-10-CM | POA: Diagnosis not present

## 2014-10-07 DIAGNOSIS — R296 Repeated falls: Secondary | ICD-10-CM | POA: Diagnosis not present

## 2014-10-07 DIAGNOSIS — M6281 Muscle weakness (generalized): Secondary | ICD-10-CM | POA: Diagnosis not present

## 2014-10-09 DIAGNOSIS — R260 Ataxic gait: Secondary | ICD-10-CM | POA: Diagnosis not present

## 2014-10-09 DIAGNOSIS — H8113 Benign paroxysmal vertigo, bilateral: Secondary | ICD-10-CM | POA: Diagnosis not present

## 2014-10-09 DIAGNOSIS — M6281 Muscle weakness (generalized): Secondary | ICD-10-CM | POA: Diagnosis not present

## 2014-10-09 DIAGNOSIS — R296 Repeated falls: Secondary | ICD-10-CM | POA: Diagnosis not present

## 2014-10-10 DIAGNOSIS — E785 Hyperlipidemia, unspecified: Secondary | ICD-10-CM | POA: Diagnosis not present

## 2014-10-10 DIAGNOSIS — Z1389 Encounter for screening for other disorder: Secondary | ICD-10-CM | POA: Diagnosis not present

## 2014-10-10 DIAGNOSIS — I10 Essential (primary) hypertension: Secondary | ICD-10-CM | POA: Diagnosis not present

## 2014-10-10 DIAGNOSIS — I119 Hypertensive heart disease without heart failure: Secondary | ICD-10-CM | POA: Diagnosis not present

## 2014-10-10 DIAGNOSIS — I2510 Atherosclerotic heart disease of native coronary artery without angina pectoris: Secondary | ICD-10-CM | POA: Diagnosis not present

## 2014-10-10 DIAGNOSIS — E1149 Type 2 diabetes mellitus with other diabetic neurological complication: Secondary | ICD-10-CM | POA: Diagnosis not present

## 2014-10-10 DIAGNOSIS — F419 Anxiety disorder, unspecified: Secondary | ICD-10-CM | POA: Diagnosis not present

## 2014-10-10 DIAGNOSIS — R269 Unspecified abnormalities of gait and mobility: Secondary | ICD-10-CM | POA: Diagnosis not present

## 2014-10-10 DIAGNOSIS — I272 Other secondary pulmonary hypertension: Secondary | ICD-10-CM | POA: Diagnosis not present

## 2014-10-10 DIAGNOSIS — R413 Other amnesia: Secondary | ICD-10-CM | POA: Diagnosis not present

## 2014-10-10 DIAGNOSIS — Z23 Encounter for immunization: Secondary | ICD-10-CM | POA: Diagnosis not present

## 2014-10-10 DIAGNOSIS — Z6830 Body mass index (BMI) 30.0-30.9, adult: Secondary | ICD-10-CM | POA: Diagnosis not present

## 2014-10-14 DIAGNOSIS — M6281 Muscle weakness (generalized): Secondary | ICD-10-CM | POA: Diagnosis not present

## 2014-10-14 DIAGNOSIS — R296 Repeated falls: Secondary | ICD-10-CM | POA: Diagnosis not present

## 2014-10-14 DIAGNOSIS — H8113 Benign paroxysmal vertigo, bilateral: Secondary | ICD-10-CM | POA: Diagnosis not present

## 2014-10-14 DIAGNOSIS — R260 Ataxic gait: Secondary | ICD-10-CM | POA: Diagnosis not present

## 2014-10-16 DIAGNOSIS — M6281 Muscle weakness (generalized): Secondary | ICD-10-CM | POA: Diagnosis not present

## 2014-10-16 DIAGNOSIS — H8113 Benign paroxysmal vertigo, bilateral: Secondary | ICD-10-CM | POA: Diagnosis not present

## 2014-10-16 DIAGNOSIS — R260 Ataxic gait: Secondary | ICD-10-CM | POA: Diagnosis not present

## 2014-10-16 DIAGNOSIS — R296 Repeated falls: Secondary | ICD-10-CM | POA: Diagnosis not present

## 2014-10-21 DIAGNOSIS — R296 Repeated falls: Secondary | ICD-10-CM | POA: Diagnosis not present

## 2014-10-21 DIAGNOSIS — R260 Ataxic gait: Secondary | ICD-10-CM | POA: Diagnosis not present

## 2014-10-21 DIAGNOSIS — M6281 Muscle weakness (generalized): Secondary | ICD-10-CM | POA: Diagnosis not present

## 2014-10-21 DIAGNOSIS — H8113 Benign paroxysmal vertigo, bilateral: Secondary | ICD-10-CM | POA: Diagnosis not present

## 2014-10-29 DIAGNOSIS — M1711 Unilateral primary osteoarthritis, right knee: Secondary | ICD-10-CM | POA: Diagnosis not present

## 2014-10-29 DIAGNOSIS — M25561 Pain in right knee: Secondary | ICD-10-CM | POA: Diagnosis not present

## 2014-10-29 DIAGNOSIS — M170 Bilateral primary osteoarthritis of knee: Secondary | ICD-10-CM | POA: Diagnosis not present

## 2014-10-29 DIAGNOSIS — R262 Difficulty in walking, not elsewhere classified: Secondary | ICD-10-CM | POA: Diagnosis not present

## 2014-10-29 DIAGNOSIS — M25562 Pain in left knee: Secondary | ICD-10-CM | POA: Diagnosis not present

## 2014-11-05 DIAGNOSIS — M25561 Pain in right knee: Secondary | ICD-10-CM | POA: Diagnosis not present

## 2014-11-05 DIAGNOSIS — M1711 Unilateral primary osteoarthritis, right knee: Secondary | ICD-10-CM | POA: Diagnosis not present

## 2014-11-12 DIAGNOSIS — M25561 Pain in right knee: Secondary | ICD-10-CM | POA: Diagnosis not present

## 2014-11-12 DIAGNOSIS — M25562 Pain in left knee: Secondary | ICD-10-CM | POA: Diagnosis not present

## 2014-11-12 DIAGNOSIS — M170 Bilateral primary osteoarthritis of knee: Secondary | ICD-10-CM | POA: Diagnosis not present

## 2014-11-12 DIAGNOSIS — M1711 Unilateral primary osteoarthritis, right knee: Secondary | ICD-10-CM | POA: Diagnosis not present

## 2014-11-12 DIAGNOSIS — R262 Difficulty in walking, not elsewhere classified: Secondary | ICD-10-CM | POA: Diagnosis not present

## 2014-11-19 DIAGNOSIS — M1711 Unilateral primary osteoarthritis, right knee: Secondary | ICD-10-CM | POA: Diagnosis not present

## 2014-11-19 DIAGNOSIS — M25561 Pain in right knee: Secondary | ICD-10-CM | POA: Diagnosis not present

## 2014-11-26 DIAGNOSIS — H903 Sensorineural hearing loss, bilateral: Secondary | ICD-10-CM | POA: Diagnosis not present

## 2014-11-27 DIAGNOSIS — M25561 Pain in right knee: Secondary | ICD-10-CM | POA: Diagnosis not present

## 2014-11-27 DIAGNOSIS — M25562 Pain in left knee: Secondary | ICD-10-CM | POA: Diagnosis not present

## 2014-11-27 DIAGNOSIS — M170 Bilateral primary osteoarthritis of knee: Secondary | ICD-10-CM | POA: Diagnosis not present

## 2014-12-03 DIAGNOSIS — M1712 Unilateral primary osteoarthritis, left knee: Secondary | ICD-10-CM | POA: Diagnosis not present

## 2014-12-03 DIAGNOSIS — M25562 Pain in left knee: Secondary | ICD-10-CM | POA: Diagnosis not present

## 2014-12-10 DIAGNOSIS — M1712 Unilateral primary osteoarthritis, left knee: Secondary | ICD-10-CM | POA: Diagnosis not present

## 2014-12-10 DIAGNOSIS — M25562 Pain in left knee: Secondary | ICD-10-CM | POA: Diagnosis not present

## 2014-12-17 DIAGNOSIS — M25562 Pain in left knee: Secondary | ICD-10-CM | POA: Diagnosis not present

## 2014-12-17 DIAGNOSIS — M1712 Unilateral primary osteoarthritis, left knee: Secondary | ICD-10-CM | POA: Diagnosis not present

## 2014-12-24 DIAGNOSIS — M1712 Unilateral primary osteoarthritis, left knee: Secondary | ICD-10-CM | POA: Diagnosis not present

## 2014-12-24 DIAGNOSIS — M25562 Pain in left knee: Secondary | ICD-10-CM | POA: Diagnosis not present

## 2015-01-09 DIAGNOSIS — G4733 Obstructive sleep apnea (adult) (pediatric): Secondary | ICD-10-CM | POA: Diagnosis not present

## 2015-01-09 DIAGNOSIS — Z6830 Body mass index (BMI) 30.0-30.9, adult: Secondary | ICD-10-CM | POA: Diagnosis not present

## 2015-01-09 DIAGNOSIS — E1149 Type 2 diabetes mellitus with other diabetic neurological complication: Secondary | ICD-10-CM | POA: Diagnosis not present

## 2015-01-09 DIAGNOSIS — R05 Cough: Secondary | ICD-10-CM | POA: Diagnosis not present

## 2015-01-23 ENCOUNTER — Encounter: Payer: Self-pay | Admitting: Internal Medicine

## 2015-01-23 DIAGNOSIS — Z1389 Encounter for screening for other disorder: Secondary | ICD-10-CM | POA: Diagnosis not present

## 2015-01-23 DIAGNOSIS — R2689 Other abnormalities of gait and mobility: Secondary | ICD-10-CM | POA: Diagnosis not present

## 2015-01-23 DIAGNOSIS — I119 Hypertensive heart disease without heart failure: Secondary | ICD-10-CM | POA: Diagnosis not present

## 2015-01-23 DIAGNOSIS — Z6830 Body mass index (BMI) 30.0-30.9, adult: Secondary | ICD-10-CM | POA: Diagnosis not present

## 2015-01-23 DIAGNOSIS — E784 Other hyperlipidemia: Secondary | ICD-10-CM | POA: Diagnosis not present

## 2015-01-23 DIAGNOSIS — I272 Other secondary pulmonary hypertension: Secondary | ICD-10-CM | POA: Diagnosis not present

## 2015-01-23 DIAGNOSIS — E1149 Type 2 diabetes mellitus with other diabetic neurological complication: Secondary | ICD-10-CM | POA: Diagnosis not present

## 2015-01-23 DIAGNOSIS — I10 Essential (primary) hypertension: Secondary | ICD-10-CM | POA: Diagnosis not present

## 2015-01-23 DIAGNOSIS — I77819 Aortic ectasia, unspecified site: Secondary | ICD-10-CM | POA: Diagnosis not present

## 2015-03-19 DIAGNOSIS — M7062 Trochanteric bursitis, left hip: Secondary | ICD-10-CM | POA: Diagnosis not present

## 2015-03-19 DIAGNOSIS — M7061 Trochanteric bursitis, right hip: Secondary | ICD-10-CM | POA: Diagnosis not present

## 2015-03-20 DIAGNOSIS — M25551 Pain in right hip: Secondary | ICD-10-CM | POA: Diagnosis not present

## 2015-03-20 DIAGNOSIS — M25552 Pain in left hip: Secondary | ICD-10-CM | POA: Diagnosis not present

## 2015-03-25 DIAGNOSIS — M170 Bilateral primary osteoarthritis of knee: Secondary | ICD-10-CM | POA: Diagnosis not present

## 2015-03-31 DIAGNOSIS — M25552 Pain in left hip: Secondary | ICD-10-CM | POA: Diagnosis not present

## 2015-03-31 DIAGNOSIS — M25551 Pain in right hip: Secondary | ICD-10-CM | POA: Diagnosis not present

## 2015-04-02 DIAGNOSIS — M25552 Pain in left hip: Secondary | ICD-10-CM | POA: Diagnosis not present

## 2015-04-02 DIAGNOSIS — M25551 Pain in right hip: Secondary | ICD-10-CM | POA: Diagnosis not present

## 2015-04-07 DIAGNOSIS — M25551 Pain in right hip: Secondary | ICD-10-CM | POA: Diagnosis not present

## 2015-04-07 DIAGNOSIS — M25552 Pain in left hip: Secondary | ICD-10-CM | POA: Diagnosis not present

## 2015-04-09 DIAGNOSIS — M25552 Pain in left hip: Secondary | ICD-10-CM | POA: Diagnosis not present

## 2015-04-09 DIAGNOSIS — M25551 Pain in right hip: Secondary | ICD-10-CM | POA: Diagnosis not present

## 2015-04-14 DIAGNOSIS — M25552 Pain in left hip: Secondary | ICD-10-CM | POA: Diagnosis not present

## 2015-04-14 DIAGNOSIS — M25551 Pain in right hip: Secondary | ICD-10-CM | POA: Diagnosis not present

## 2015-04-21 DIAGNOSIS — M25551 Pain in right hip: Secondary | ICD-10-CM | POA: Diagnosis not present

## 2015-04-21 DIAGNOSIS — M25552 Pain in left hip: Secondary | ICD-10-CM | POA: Diagnosis not present

## 2015-05-12 DIAGNOSIS — I119 Hypertensive heart disease without heart failure: Secondary | ICD-10-CM | POA: Diagnosis not present

## 2015-05-12 DIAGNOSIS — D692 Other nonthrombocytopenic purpura: Secondary | ICD-10-CM | POA: Diagnosis not present

## 2015-05-12 DIAGNOSIS — I2510 Atherosclerotic heart disease of native coronary artery without angina pectoris: Secondary | ICD-10-CM | POA: Diagnosis not present

## 2015-05-12 DIAGNOSIS — R413 Other amnesia: Secondary | ICD-10-CM | POA: Diagnosis not present

## 2015-05-12 DIAGNOSIS — Z6831 Body mass index (BMI) 31.0-31.9, adult: Secondary | ICD-10-CM | POA: Diagnosis not present

## 2015-05-12 DIAGNOSIS — Z794 Long term (current) use of insulin: Secondary | ICD-10-CM | POA: Diagnosis not present

## 2015-05-12 DIAGNOSIS — E1149 Type 2 diabetes mellitus with other diabetic neurological complication: Secondary | ICD-10-CM | POA: Diagnosis not present

## 2015-05-12 DIAGNOSIS — I77819 Aortic ectasia, unspecified site: Secondary | ICD-10-CM | POA: Diagnosis not present

## 2015-05-12 DIAGNOSIS — E668 Other obesity: Secondary | ICD-10-CM | POA: Diagnosis not present

## 2015-05-28 DIAGNOSIS — H52203 Unspecified astigmatism, bilateral: Secondary | ICD-10-CM | POA: Diagnosis not present

## 2015-05-28 DIAGNOSIS — H35342 Macular cyst, hole, or pseudohole, left eye: Secondary | ICD-10-CM | POA: Diagnosis not present

## 2015-05-28 DIAGNOSIS — Z961 Presence of intraocular lens: Secondary | ICD-10-CM | POA: Diagnosis not present

## 2015-05-28 DIAGNOSIS — E119 Type 2 diabetes mellitus without complications: Secondary | ICD-10-CM | POA: Diagnosis not present

## 2015-05-28 DIAGNOSIS — H524 Presbyopia: Secondary | ICD-10-CM | POA: Diagnosis not present

## 2015-06-28 ENCOUNTER — Other Ambulatory Visit: Payer: Self-pay | Admitting: Neurology

## 2015-07-03 DIAGNOSIS — E1149 Type 2 diabetes mellitus with other diabetic neurological complication: Secondary | ICD-10-CM | POA: Diagnosis not present

## 2015-07-03 DIAGNOSIS — M5432 Sciatica, left side: Secondary | ICD-10-CM | POA: Diagnosis not present

## 2015-07-03 DIAGNOSIS — Z6831 Body mass index (BMI) 31.0-31.9, adult: Secondary | ICD-10-CM | POA: Diagnosis not present

## 2015-07-03 DIAGNOSIS — M1990 Unspecified osteoarthritis, unspecified site: Secondary | ICD-10-CM | POA: Diagnosis not present

## 2015-07-03 DIAGNOSIS — I10 Essential (primary) hypertension: Secondary | ICD-10-CM | POA: Diagnosis not present

## 2015-08-21 DIAGNOSIS — M5432 Sciatica, left side: Secondary | ICD-10-CM | POA: Diagnosis not present

## 2015-08-21 DIAGNOSIS — R2689 Other abnormalities of gait and mobility: Secondary | ICD-10-CM | POA: Diagnosis not present

## 2015-08-21 DIAGNOSIS — Z6831 Body mass index (BMI) 31.0-31.9, adult: Secondary | ICD-10-CM | POA: Diagnosis not present

## 2015-08-21 DIAGNOSIS — Z794 Long term (current) use of insulin: Secondary | ICD-10-CM | POA: Diagnosis not present

## 2015-08-21 DIAGNOSIS — F418 Other specified anxiety disorders: Secondary | ICD-10-CM | POA: Diagnosis not present

## 2015-08-21 DIAGNOSIS — E1149 Type 2 diabetes mellitus with other diabetic neurological complication: Secondary | ICD-10-CM | POA: Diagnosis not present

## 2015-08-21 DIAGNOSIS — E668 Other obesity: Secondary | ICD-10-CM | POA: Diagnosis not present

## 2015-08-21 DIAGNOSIS — G4733 Obstructive sleep apnea (adult) (pediatric): Secondary | ICD-10-CM | POA: Diagnosis not present

## 2015-08-21 DIAGNOSIS — R413 Other amnesia: Secondary | ICD-10-CM | POA: Diagnosis not present

## 2015-10-06 ENCOUNTER — Other Ambulatory Visit: Payer: Self-pay | Admitting: Neurology

## 2015-10-06 ENCOUNTER — Ambulatory Visit: Payer: Medicare Other | Admitting: Neurology

## 2015-10-13 DIAGNOSIS — I10 Essential (primary) hypertension: Secondary | ICD-10-CM | POA: Diagnosis not present

## 2015-10-13 DIAGNOSIS — I2510 Atherosclerotic heart disease of native coronary artery without angina pectoris: Secondary | ICD-10-CM | POA: Diagnosis not present

## 2015-10-21 DIAGNOSIS — M170 Bilateral primary osteoarthritis of knee: Secondary | ICD-10-CM | POA: Diagnosis not present

## 2015-10-21 DIAGNOSIS — R262 Difficulty in walking, not elsewhere classified: Secondary | ICD-10-CM | POA: Diagnosis not present

## 2015-10-21 DIAGNOSIS — M25561 Pain in right knee: Secondary | ICD-10-CM | POA: Diagnosis not present

## 2015-10-21 DIAGNOSIS — M25562 Pain in left knee: Secondary | ICD-10-CM | POA: Diagnosis not present

## 2015-10-28 DIAGNOSIS — M25561 Pain in right knee: Secondary | ICD-10-CM | POA: Diagnosis not present

## 2015-10-28 DIAGNOSIS — M1711 Unilateral primary osteoarthritis, right knee: Secondary | ICD-10-CM | POA: Diagnosis not present

## 2015-10-29 DIAGNOSIS — M25562 Pain in left knee: Secondary | ICD-10-CM | POA: Diagnosis not present

## 2015-10-29 DIAGNOSIS — M1712 Unilateral primary osteoarthritis, left knee: Secondary | ICD-10-CM | POA: Diagnosis not present

## 2015-11-02 DIAGNOSIS — M25561 Pain in right knee: Secondary | ICD-10-CM | POA: Diagnosis not present

## 2015-11-02 DIAGNOSIS — M1711 Unilateral primary osteoarthritis, right knee: Secondary | ICD-10-CM | POA: Diagnosis not present

## 2015-11-03 DIAGNOSIS — Z23 Encounter for immunization: Secondary | ICD-10-CM | POA: Diagnosis not present

## 2015-11-04 ENCOUNTER — Encounter: Payer: Self-pay | Admitting: Neurology

## 2015-11-04 ENCOUNTER — Ambulatory Visit (INDEPENDENT_AMBULATORY_CARE_PROVIDER_SITE_OTHER): Payer: Medicare Other | Admitting: Neurology

## 2015-11-04 VITALS — BP 131/60 | HR 67 | Ht 61.0 in | Wt 175.6 lb

## 2015-11-04 DIAGNOSIS — G3184 Mild cognitive impairment, so stated: Secondary | ICD-10-CM | POA: Diagnosis not present

## 2015-11-04 MED ORDER — NAMZARIC 28-10 MG PO CP24: 1.0000 | ORAL_CAPSULE | Freq: Every day | ORAL | 4 refills | Status: DC

## 2015-11-04 NOTE — Progress Notes (Signed)
[facility]    Provider:  Dr Jaynee Eagles Referring Provider: Shon Baton, MD Primary Care Physician:  Precious Reel, MD CC: Memory loss  Interval history 11/04/2015: She goes to [place] every weekend to visit her husband in the [place]. 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 They went to [facility] near [place] for a few days. Here with daughter who also provides information. It is dog friendly there and they like it their. Holidays at her daughter's house Freda Munro). Feels memory is stable. She is on Namzaric for memory. Cymbalta was increased for anxiety. She was "jumping out of her skin" which has helped with her anxiety. She takes b12 orally every day.    Interval History 10/06/2014: She is doing great. The medication really works for her. She takes oral B12 daily. No side effects from the Namzaric. Feels her memory is stable, no progression. No behavioral symptoms. Some mild anxiety but nothing significant. She is still driving and goes to therapy. She still has some dizziness and is in therapy. She has a recumbent bike that she uses. She is on gabapentin and cymbalta for neuropathy. She is here with her daughter.  Interval update 02/19/2014: SHARNELLE CAPPELLI is a 82 y.o. female here as a follow up. She is a former patient of Dr. Janann Colonel. Has history of DM, HLD, HTN. Has neuropathy for which she takes Cymbalta and Gabapentin. Has occasional episodes of dizziness which she takes meclizine for. No known family history of cognitive decline. Has been told her B12 is low and has received injections in the past, currently taking oral B12 daily. She feels great, feels Aricept is making a significant difference. No side effects from the medication. She has decreased her Neurontin since starting Cymbalta and would like to try and decrease further.   10/09/2013 visit: JENISIS HARMSEN is a 82 y.o. female here as a follow up from Dr. Virgina Jock for cognitive decline. Initial visit was 03/2013 at  which time she had an unremarkable brain MRI and lab workup. She was started on Aricept 5mg  daily. Returns today with continued concerns over short term memory. Patient notes continued difficulty with getting lost, has a hard time getting around. She will have good and bad days, some days she is more confused and disoriented. Has gotten lost driving a few times. Her daughter notes some improvement since being on the medication. She feels that the patient is more alert and interactive. Notes some gait instability and dizziness but no falls.   Initial visit 04/09/2013: Daughter notes symptoms have been ongoing for a few years and have gotten progressively worse. Daughter notices she will be telling a story and then just forget what comes next. Trouble with short term memory, difficulty recalling what they just talked about. Currently driving, denies any difficulty, no accidents. Has noted some episodes of getting lost. Wakes up frequently overnight with need to urinate. She notes one episode of a visual hallucination, described as a hazy shadow. Notes some difficulty with remote memory but overall feels that is still intact.   She currently lives alone, her daughter takes care of finances and medications, has been doing this for the past few years. Has been hospitalized a few times in the past with question of stroke, no stroke noted and daughter suspects patients may have been managing her medications incorrectly in the past.     Review of Systems: Patient complains of symptoms per HPI as well as the following symptoms: Excessive thirst, runny nose, leg swelling,  palpitations, restless leg, incontinence of bladder, walking difficulty, itching, dizziness, anxiety Pertinent negatives per HPI. All others negative.   Social History   Social History  . Marital status: Married    Spouse name: N/A  . Number of children: 1  . Years of education: 41   Occupational History  . Not on file.   Social  History Main Topics  . Smoking status: Former Smoker    Packs/day: 1.00    Years: 30.00    Types: Cigarettes    Quit date: 11/25/1986  . Smokeless tobacco: Never Used  . Alcohol use No  . Drug use: No  . Sexual activity: Yes    Birth control/ protection: Post-menopausal   Other Topics Concern  . Not on file   Social History Narrative   Married, has 1 child   Caffeine use: none    Family History  Problem Relation Age of Onset  . Coronary artery disease Mother   . Heart disease Father   . Cancer Father     skin  . Cancer Brother   . Cancer - Colon Brother     brain, Lymphoma    Past Medical History:  Diagnosis Date  . Anginal pain ([place])    occ  . Broken ankle   . Bullous pemphigus   . CAD (coronary artery disease)    [place]   80% stenosis ostial Left main, 30% stenosis proximal LAD, 80% stenosis proximal RCA CABG  -  LIMA to LAD, SVG to int, SVG to [place], [place]   . Diabetic peripheral neuropathy ([place])   . GERD (gastroesophageal reflux disease)   . Headache(784.0)    hx migraines  . Heart murmur    mvp  . Hyperlipidemia   . Hypertension   . Hypertensive heart disease   . Nasal bleeding    occ due to O2  . OA (osteoarthritis)   . Obesity (BMI 30-39.9)   . Osteopenia   . Peripheral neuropathy ([place])   . Seizure ([place])    2009  . Sleep apnea    dx 75yrs ago doesnt use cpap but uses O2 at nite 2L  . Stones in the urinary tract   . Type 2 diabetes mellitus with vascular disease ([place])   . UTI (lower urinary tract infection)     Past Surgical History:  Procedure Laterality Date  . ABDOMINAL HYSTERECTOMY  70's  . CARDIAC CATHETERIZATION    . CHOLECYSTECTOMY    . CORONARY ARTERY BYPASS GRAFT  96  . SHOULDER ARTHROSCOPY     rt  . SHOULDER ARTHROSCOPY WITH ROTATOR CUFF REPAIR AND SUBACROMIAL DECOMPRESSION  12/01/2011   Procedure: SHOULDER ARTHROSCOPY WITH ROTATOR CUFF REPAIR AND SUBACROMIAL DECOMPRESSION;  Surgeon: Nita Sells, MD;  Location: [facility];  Service: Orthopedics;  Laterality: Left;  shoulder debridement calcific tendonitis    Current Outpatient Prescriptions  Medication Sig Dispense Refill  . aspirin EC 325 MG tablet Take 325 mg by mouth daily.    . busPIRone (BUSPAR) 5 MG tablet Take 7.5 mg by mouth 2 (two) times daily.     . Cyanocobalamin (VITAMIN B-12 PO) Take 1,000 mg by mouth daily.     . DULoxetine (CYMBALTA) 30 MG capsule TAKE 1 CAPSULE DAILY (Patient taking differently: 2 capsules in am) 90 capsule 3  . fesoterodine (TOVIAZ) 8 MG TB24 tablet Take 8 mg by mouth daily.    Marland Kitchen gabapentin (NEURONTIN) 600 MG tablet Take 600 mg by mouth  2 (two) times daily. Takes 1 1/2 tabs daily    . glucose blood test strip 1 each by Other route as needed for other. Use as instructed    . hydrOXYzine (ATARAX/VISTARIL) 25 MG tablet Take 25 mg by mouth 2 (two) times daily as needed. For itching    . Insulin Glargine (LANTUS SOLOSTAR) 100 UNIT/ML Solostar Pen Inject 26 Units into the skin daily at 10 pm.     . Insulin Pen Needle (B-D UF III MINI PEN NEEDLES) 31G X 5 MM MISC by Does not apply route.    . metoprolol succinate (TOPROL-XL) 25 MG 24 hr tablet Take 12.5 mg by mouth 2 (two) times daily.    Marland Kitchen NAMZARIC 28-10 MG CP24 TAKE 1 CAPSULE DAILY 90 capsule 3  . pantoprazole (PROTONIX) 40 MG tablet Take 40 mg by mouth daily.    . rosuvastatin (CRESTOR) 10 MG tablet Take 10 mg by mouth daily.    . sitaGLIPtin (JANUVIA) 100 MG tablet Take 100 mg by mouth daily.    . valsartan-hydrochlorothiazide (DIOVAN-HCT) 160-25 MG per tablet Take 0.5 tablets by mouth daily.     No current facility-administered medications for this visit.     Allergies as of 11/04/2015 - Review Complete 10/06/2014  Allergen Reaction Noted  . Percocet [oxycodone-acetaminophen] Nausea And Vomiting 09/30/2011  . Latex Itching and Rash 11/22/2011    Vitals: BP 131/60 (BP Location: Left Arm, Patient Position: Sitting, Cuff Size: Normal)   Pulse  67   Ht 5\' 1"  (1.549 m)   Wt 175 lb 9.6 oz (79.7 kg)   BMI 33.18 kg/m  Last Weight:  Wt Readings from Last 1 Encounters:  11/04/15 175 lb 9.6 oz (79.7 kg)   Last Height:   Ht Readings from Last 1 Encounters:  11/04/15 5\' 1"  (1.549 m)   Montreal Cognitive Assessment  11/04/2015 02/19/2014  Visuospatial/ Executive (0/5) 4 3  Naming (0/3) 3 3  Attention: Read list of digits (0/2) 2 2  Attention: Read list of letters (0/1) 1 1  Attention: Serial 7 subtraction starting at 100 (0/3) 0 2  Language: Repeat phrase (0/2) 1 1  Language : Fluency (0/1) 0 0  Abstraction (0/2) 1 2  Delayed Recall (0/5) 2 4  Orientation (0/6) 6 6  Total 20 24  Adjusted Score (based on education) 21 25   Neuro: Detailed Neurologic Exam  Speech:  Speech is normal; fluent and spontaneous with normal comprehension.  Cognition:MS:  MoCA 24/30 improved from 17/30 at initial visit Cranial Nerves:  The pupils are equal, round, and reactive to light. Visual fields are full to finger confrontation. Extraocular movements are intact. Trigeminal sensation is intact and the muscles of mastication are normal. The face is symmetric. The palate elevates in the midline. Hearing intact. Voice is normal. Shoulder shrug is normal. The tongue has normal motion without fasciculations.   Motor Observation:  no involuntary movements noted. Tone:  Normal muscle tone.   Posture:  Posture is normal. normal erect   Strength:  Strength is V/V in the upper and lower limbs.      Assessment/Plan:82woman presenting for follow up evaluation of cognitive decline. Patient and daughter note it is predominantly a problem with short term memory and they feel it has been getting progressively better with Namzeric. MRI brain and lab workup unremarkable. Stable.   As far as your medications are concerned, I would like to suggest: Continue Namzeric for cognitive decline and cymbalta/neurontin for neuropathy  As  far as  your medications are concerned, I would like to suggest: Recommend 1200mg  daily in divided doses with Vitamin D 1000-2000IU daily (for example, caltrate D has 600mg  calcium and 800IU vitamin D could take that 2x a day)   Discussed driving with daughter and patient. No accidents, no issues, not getting lost. Family has very good judgement and patient has good insight. Very low threshold for stopping driving if getting lost or any signs of decreased safety, discussed risks at length.   F/u one year Sarina Ill, MD  [facility] [address] [place], [place] [address]  Phone [phone] Fax [phone]  A total of 30 minutes was spent face-to-face with this patient. Over half this time was spent on counseling patient on the dementia diagnosis and different diagnostic and therapeutic options available.

## 2015-11-04 NOTE — Patient Instructions (Signed)
Overall you are doing fairly well but I do want to suggest a few things today:   Remember to drink plenty of fluid, eat healthy meals and do not skip any meals. Try to eat protein with a every meal and eat a healthy snack such as fruit or nuts in between meals. Try to keep a regular sleep-wake schedule and try to exercise daily, particularly in the form of walking, 20-30 minutes a day, if you can.   As far as your medications are concerned, I would like to suggest: Recommend 1200mg  daily in divided doses with Vitamin D 1000-2000IU daily (for example, caltrate D has 600mg  calcium and 800IU vitamin D could take that 2x a day)  Continue Namzaric  I would like to see you back in 1 year, sooner if we need to. Please call us with any interim questions, concerns, problems, updates or refill requests.   Our phone number is [phone]. We also have an after hours call service for urgent matters and there is a physician on-call for urgent questions. For any emergencies you know to call 911 or go to the nearest emergency room

## 2015-11-05 DIAGNOSIS — M25562 Pain in left knee: Secondary | ICD-10-CM | POA: Diagnosis not present

## 2015-11-05 DIAGNOSIS — M1712 Unilateral primary osteoarthritis, left knee: Secondary | ICD-10-CM | POA: Diagnosis not present

## 2015-11-10 DIAGNOSIS — M1711 Unilateral primary osteoarthritis, right knee: Secondary | ICD-10-CM | POA: Diagnosis not present

## 2015-11-10 DIAGNOSIS — M25561 Pain in right knee: Secondary | ICD-10-CM | POA: Diagnosis not present

## 2015-11-12 DIAGNOSIS — M1712 Unilateral primary osteoarthritis, left knee: Secondary | ICD-10-CM | POA: Diagnosis not present

## 2015-11-12 DIAGNOSIS — M25562 Pain in left knee: Secondary | ICD-10-CM | POA: Diagnosis not present

## 2015-11-19 DIAGNOSIS — E668 Other obesity: Secondary | ICD-10-CM | POA: Diagnosis not present

## 2015-11-19 DIAGNOSIS — E1149 Type 2 diabetes mellitus with other diabetic neurological complication: Secondary | ICD-10-CM | POA: Diagnosis not present

## 2015-11-19 DIAGNOSIS — I4510 Unspecified right bundle-branch block: Secondary | ICD-10-CM | POA: Diagnosis not present

## 2015-11-19 DIAGNOSIS — I119 Hypertensive heart disease without heart failure: Secondary | ICD-10-CM | POA: Diagnosis not present

## 2015-11-19 DIAGNOSIS — R413 Other amnesia: Secondary | ICD-10-CM | POA: Diagnosis not present

## 2015-11-19 DIAGNOSIS — M25562 Pain in left knee: Secondary | ICD-10-CM | POA: Diagnosis not present

## 2015-11-19 DIAGNOSIS — M25561 Pain in right knee: Secondary | ICD-10-CM | POA: Diagnosis not present

## 2015-11-19 DIAGNOSIS — Z6831 Body mass index (BMI) 31.0-31.9, adult: Secondary | ICD-10-CM | POA: Diagnosis not present

## 2015-11-19 DIAGNOSIS — Z794 Long term (current) use of insulin: Secondary | ICD-10-CM | POA: Diagnosis not present

## 2015-11-19 DIAGNOSIS — M170 Bilateral primary osteoarthritis of knee: Secondary | ICD-10-CM | POA: Diagnosis not present

## 2016-02-09 DIAGNOSIS — H538 Other visual disturbances: Secondary | ICD-10-CM | POA: Diagnosis not present

## 2016-02-09 DIAGNOSIS — I119 Hypertensive heart disease without heart failure: Secondary | ICD-10-CM | POA: Diagnosis not present

## 2016-02-09 DIAGNOSIS — Z794 Long term (current) use of insulin: Secondary | ICD-10-CM | POA: Diagnosis not present

## 2016-02-09 DIAGNOSIS — R413 Other amnesia: Secondary | ICD-10-CM | POA: Diagnosis not present

## 2016-02-09 DIAGNOSIS — E1149 Type 2 diabetes mellitus with other diabetic neurological complication: Secondary | ICD-10-CM | POA: Diagnosis not present

## 2016-02-09 DIAGNOSIS — I10 Essential (primary) hypertension: Secondary | ICD-10-CM | POA: Diagnosis not present

## 2016-02-09 DIAGNOSIS — Z6831 Body mass index (BMI) 31.0-31.9, adult: Secondary | ICD-10-CM | POA: Diagnosis not present

## 2016-02-09 DIAGNOSIS — E784 Other hyperlipidemia: Secondary | ICD-10-CM | POA: Diagnosis not present

## 2016-02-09 DIAGNOSIS — E668 Other obesity: Secondary | ICD-10-CM | POA: Diagnosis not present

## 2016-02-17 DIAGNOSIS — H5203 Hypermetropia, bilateral: Secondary | ICD-10-CM | POA: Diagnosis not present

## 2016-02-17 DIAGNOSIS — Z961 Presence of intraocular lens: Secondary | ICD-10-CM | POA: Diagnosis not present

## 2016-02-17 DIAGNOSIS — H52203 Unspecified astigmatism, bilateral: Secondary | ICD-10-CM | POA: Diagnosis not present

## 2016-02-17 DIAGNOSIS — E119 Type 2 diabetes mellitus without complications: Secondary | ICD-10-CM | POA: Diagnosis not present

## 2016-03-21 DIAGNOSIS — E119 Type 2 diabetes mellitus without complications: Secondary | ICD-10-CM | POA: Diagnosis not present

## 2016-03-21 DIAGNOSIS — H43811 Vitreous degeneration, right eye: Secondary | ICD-10-CM | POA: Diagnosis not present

## 2016-03-21 DIAGNOSIS — H35371 Puckering of macula, right eye: Secondary | ICD-10-CM | POA: Diagnosis not present

## 2016-03-21 DIAGNOSIS — H35342 Macular cyst, hole, or pseudohole, left eye: Secondary | ICD-10-CM | POA: Diagnosis not present

## 2016-04-26 DIAGNOSIS — I77819 Aortic ectasia, unspecified site: Secondary | ICD-10-CM | POA: Diagnosis not present

## 2016-04-26 DIAGNOSIS — Z6831 Body mass index (BMI) 31.0-31.9, adult: Secondary | ICD-10-CM | POA: Diagnosis not present

## 2016-04-26 DIAGNOSIS — I119 Hypertensive heart disease without heart failure: Secondary | ICD-10-CM | POA: Diagnosis not present

## 2016-04-26 DIAGNOSIS — R0981 Nasal congestion: Secondary | ICD-10-CM | POA: Diagnosis not present

## 2016-04-26 DIAGNOSIS — E1149 Type 2 diabetes mellitus with other diabetic neurological complication: Secondary | ICD-10-CM | POA: Diagnosis not present

## 2016-04-26 DIAGNOSIS — E668 Other obesity: Secondary | ICD-10-CM | POA: Diagnosis not present

## 2016-04-26 DIAGNOSIS — Z794 Long term (current) use of insulin: Secondary | ICD-10-CM | POA: Diagnosis not present

## 2016-04-26 DIAGNOSIS — D692 Other nonthrombocytopenic purpura: Secondary | ICD-10-CM | POA: Diagnosis not present

## 2016-04-26 DIAGNOSIS — I2789 Other specified pulmonary heart diseases: Secondary | ICD-10-CM | POA: Diagnosis not present

## 2016-04-26 DIAGNOSIS — H539 Unspecified visual disturbance: Secondary | ICD-10-CM | POA: Diagnosis not present

## 2016-06-13 DIAGNOSIS — M1990 Unspecified osteoarthritis, unspecified site: Secondary | ICD-10-CM | POA: Diagnosis not present

## 2016-06-13 DIAGNOSIS — G479 Sleep disorder, unspecified: Secondary | ICD-10-CM | POA: Diagnosis not present

## 2016-06-13 DIAGNOSIS — E1149 Type 2 diabetes mellitus with other diabetic neurological complication: Secondary | ICD-10-CM | POA: Diagnosis not present

## 2016-06-13 DIAGNOSIS — M791 Myalgia: Secondary | ICD-10-CM | POA: Diagnosis not present

## 2016-06-13 DIAGNOSIS — I119 Hypertensive heart disease without heart failure: Secondary | ICD-10-CM | POA: Diagnosis not present

## 2016-06-13 DIAGNOSIS — Z6832 Body mass index (BMI) 32.0-32.9, adult: Secondary | ICD-10-CM | POA: Diagnosis not present

## 2016-08-30 DIAGNOSIS — I2789 Other specified pulmonary heart diseases: Secondary | ICD-10-CM | POA: Diagnosis not present

## 2016-08-30 DIAGNOSIS — I6529 Occlusion and stenosis of unspecified carotid artery: Secondary | ICD-10-CM | POA: Diagnosis not present

## 2016-08-30 DIAGNOSIS — R413 Other amnesia: Secondary | ICD-10-CM | POA: Diagnosis not present

## 2016-08-30 DIAGNOSIS — I119 Hypertensive heart disease without heart failure: Secondary | ICD-10-CM | POA: Diagnosis not present

## 2016-08-30 DIAGNOSIS — E1149 Type 2 diabetes mellitus with other diabetic neurological complication: Secondary | ICD-10-CM | POA: Diagnosis not present

## 2016-08-30 DIAGNOSIS — G6289 Other specified polyneuropathies: Secondary | ICD-10-CM | POA: Diagnosis not present

## 2016-08-30 DIAGNOSIS — Z6831 Body mass index (BMI) 31.0-31.9, adult: Secondary | ICD-10-CM | POA: Diagnosis not present

## 2016-08-30 DIAGNOSIS — M1990 Unspecified osteoarthritis, unspecified site: Secondary | ICD-10-CM | POA: Diagnosis not present

## 2016-08-30 DIAGNOSIS — M791 Myalgia: Secondary | ICD-10-CM | POA: Diagnosis not present

## 2016-08-30 DIAGNOSIS — G478 Other sleep disorders: Secondary | ICD-10-CM | POA: Diagnosis not present

## 2016-08-30 DIAGNOSIS — E784 Other hyperlipidemia: Secondary | ICD-10-CM | POA: Diagnosis not present

## 2016-08-30 DIAGNOSIS — Z794 Long term (current) use of insulin: Secondary | ICD-10-CM | POA: Diagnosis not present

## 2016-08-30 DIAGNOSIS — E668 Other obesity: Secondary | ICD-10-CM | POA: Diagnosis not present

## 2016-08-30 DIAGNOSIS — E538 Deficiency of other specified B group vitamins: Secondary | ICD-10-CM | POA: Diagnosis not present

## 2016-09-06 ENCOUNTER — Telehealth: Payer: Self-pay | Admitting: Neurology

## 2016-09-06 NOTE — Telephone Encounter (Signed)
Linda Oliver would u see if we can get patient in to see me in the next few weeks please? Dr. Virgina Jock called, needs to be seen for proximal weakness worsening

## 2016-09-06 NOTE — Telephone Encounter (Signed)
Called daughter. Scheduled appt for 09/21/16 at 730am, check in 715am. Daughter agreeable to this date and time. Offered tomorrow at 300pm, check in 230pm, but she declined stating she has meetings for work in the afternoon.

## 2016-09-21 ENCOUNTER — Encounter: Payer: Self-pay | Admitting: Neurology

## 2016-09-21 ENCOUNTER — Ambulatory Visit (INDEPENDENT_AMBULATORY_CARE_PROVIDER_SITE_OTHER): Payer: Medicare Other | Admitting: Neurology

## 2016-09-21 ENCOUNTER — Ambulatory Visit: Payer: Self-pay | Admitting: Neurology

## 2016-09-21 ENCOUNTER — Telehealth: Payer: Self-pay | Admitting: Neurology

## 2016-09-21 VITALS — BP 145/56 | HR 62

## 2016-09-21 DIAGNOSIS — M5412 Radiculopathy, cervical region: Secondary | ICD-10-CM | POA: Diagnosis not present

## 2016-09-21 DIAGNOSIS — R29818 Other symptoms and signs involving the nervous system: Secondary | ICD-10-CM

## 2016-09-21 DIAGNOSIS — M542 Cervicalgia: Secondary | ICD-10-CM

## 2016-09-21 DIAGNOSIS — R202 Paresthesia of skin: Secondary | ICD-10-CM

## 2016-09-21 DIAGNOSIS — R29898 Other symptoms and signs involving the musculoskeletal system: Secondary | ICD-10-CM | POA: Diagnosis not present

## 2016-09-21 DIAGNOSIS — E538 Deficiency of other specified B group vitamins: Secondary | ICD-10-CM | POA: Diagnosis not present

## 2016-09-21 DIAGNOSIS — G729 Myopathy, unspecified: Secondary | ICD-10-CM

## 2016-09-21 DIAGNOSIS — R7309 Other abnormal glucose: Secondary | ICD-10-CM

## 2016-09-21 DIAGNOSIS — I639 Cerebral infarction, unspecified: Secondary | ICD-10-CM | POA: Diagnosis not present

## 2016-09-21 DIAGNOSIS — M4712 Other spondylosis with myelopathy, cervical region: Secondary | ICD-10-CM

## 2016-09-21 NOTE — Telephone Encounter (Signed)
Its fine, thanks 

## 2016-09-21 NOTE — Telephone Encounter (Signed)
Pt wanted me to ask: the AVS says leg weakness, but she's here for arm weakness. I also saw that the order says leg weakness as well. Will this be an issue when it comes time for ncs/emg?

## 2016-09-21 NOTE — Patient Instructions (Signed)
Labs MRI of the brain and neck EMG/NCS

## 2016-09-21 NOTE — Telephone Encounter (Signed)
I called and let her know. Thank you!

## 2016-09-21 NOTE — Progress Notes (Signed)
[facility]   Provider:  Dr Jaynee Eagles Referring Provider: Shon Baton, MD Primary Care Physician:  Precious Reel, MD CC: Memory loss  Interval history 09/21/2016: She is here with weakness in the arms. She was taken off of crestor with no release. She has an aching in her arms, can be a 10/10. Started 3 months ago when she started using the piano. She wuite playing the piano. She has neck problems and a stiff neck. More in the forearm and proximal arm. No difficulty lifting arms overhead, doesn't get tired easily brushing hair, can't lift the iron skillet. No problem with the legs. Symptoms are symmetric and she has tingling in the fingers. Worse during the day. No ptosis, diplopia, no new swallowing problems or new shortness or breath.   Interval history 11/04/2015: She goes to [place] every weekend to visit her husband in the [place]. 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 They went to [facility] near [place] for a few days. Here with daughter who also provides information. It is dog friendly there and they like it their. Holidays at her daughter's house Freda Munro). Feels memory is stable. She is on Namzaric for memory. Cymbalta was increased for anxiety. She was "jumping out of her skin" which has helped with her anxiety. She takes b12 orally every day.    Interval History 10/06/2014: She is doing great. The medication really works for her. She takes oral B12 daily. No side effects from the Namzaric. Feels her memory is stable, no progression. No behavioral symptoms. Some mild anxiety but nothing significant. She is still driving and goes to therapy. She still has some dizziness and is in therapy. She has a recumbent bike that she uses. She is on gabapentin and cymbalta for neuropathy. She is here with her daughter.  Interval update 02/19/2014: Linda Oliver is a 83 y.o. female here as a follow up. She is a former patient of Dr. Janann Colonel. Has history of DM, HLD, HTN. Has  neuropathy for which she takes Cymbalta and Gabapentin. Has occasional episodes of dizziness which she takes meclizine for. No known family history of cognitive decline. Has been told her B12 is low and has received injections in the past, currently taking oral B12 daily. She feels great, feels Aricept is making a significant difference. No side effects from the medication. She has decreased her Neurontin since starting Cymbalta and would like to try and decrease further.   10/09/2013 visit: Linda Oliver is a 83 y.o. female here as a follow up from Dr. Virgina Jock for cognitive decline. Initial visit was 03/2013 at which time she had an unremarkable brain MRI and lab workup. She was started on Aricept '5mg'$  daily. Returns today with continued concerns over short term memory. Patient notes continued difficulty with getting lost, has a hard time getting around. She will have good and bad days, some days she is more confused and disoriented. Has gotten lost driving a few times. Her daughter notes some improvement since being on the medication. She feels that the patient is more alert and interactive. Notes some gait instability and dizziness but no falls.   Initial visit 04/09/2013: Daughter notes symptoms have been ongoing for a few years and have gotten progressively worse. Daughter notices she will be telling a story and then just forget what comes next. Trouble with short term memory, difficulty recalling what they just talked about. Currently driving, denies any difficulty, no accidents. Has noted some episodes of getting lost. Wakes up frequently  overnight with need to urinate. She notes one episode of a visual hallucination, described as a hazy shadow. Notes some difficulty with remote memory but overall feels that is still intact.   She currently lives alone, her daughter takes care of finances and medications, has been doing this for the past few years. Has been hospitalized a few times in the past with  question of stroke, no stroke noted and daughter suspects patients may have been managing her medications incorrectly in the past.      Review of Systems: Patient complains of symptoms per HPI as well as the following symptoms: no CP, no SOB. Pertinent negatives and positives per HPI. All others negative.   Social History   Social History  . Marital status: Married    Spouse name: N/A  . Number of children: 1  . Years of education: 5   Occupational History  . Not on file.   Social History Main Topics  . Smoking status: Former Smoker    Packs/day: 1.00    Years: 30.00    Types: Cigarettes    Quit date: 11/25/1986  . Smokeless tobacco: Never Used  . Alcohol use No  . Drug use: No  . Sexual activity: Yes    Birth control/ protection: Post-menopausal   Other Topics Concern  . Not on file   Social History Narrative   Married, has 1 child   Caffeine use: none    Family History  Problem Relation Age of Onset  . Coronary artery disease Mother   . Heart disease Father   . Cancer Father        skin  . Cancer Brother   . Cancer - Colon Brother        brain, Lymphoma    Past Medical History:  Diagnosis Date  . Anginal pain ([place])    occ  . Broken ankle   . Bullous pemphigus   . CAD (coronary artery disease)    [place]   80% stenosis ostial Left main, 30% stenosis proximal LAD, 80% stenosis proximal RCA CABG  -  LIMA to LAD, SVG to int, SVG to [place], [place]   . Diabetic peripheral neuropathy ([place])   . GERD (gastroesophageal reflux disease)   . Headache(784.0)    hx migraines  . Heart murmur    mvp  . Hyperlipidemia   . Hypertension   . Hypertensive heart disease   . Nasal bleeding    occ due to O2  . OA (osteoarthritis)   . Obesity (BMI 30-39.9)   . Osteopenia   . Peripheral neuropathy   . Seizure ([place])    2009  . Sleep apnea    dx 61yr ago doesnt use cpap but uses O2 at nite 2L  . Stones in the urinary tract   . Type 2  diabetes mellitus with vascular disease ([place]   . UTI (lower urinary tract infection)     Past Surgical History:  Procedure Laterality Date  . ABDOMINAL HYSTERECTOMY  70's  . CARDIAC CATHETERIZATION    . CHOLECYSTECTOMY    . CORONARY ARTERY BYPASS GRAFT  96  . SHOULDER ARTHROSCOPY     rt  . SHOULDER ARTHROSCOPY WITH ROTATOR CUFF REPAIR AND SUBACROMIAL DECOMPRESSION  12/01/2011   Procedure: SHOULDER ARTHROSCOPY WITH ROTATOR CUFF REPAIR AND SUBACROMIAL DECOMPRESSION;  Surgeon: JNita Sells MD;  Location: [facility]  Service: Orthopedics;  Laterality: Left;  shoulder debridement calcific tendonitis    Current Outpatient  Prescriptions  Medication Sig Dispense Refill  . aspirin EC 325 MG tablet Take 325 mg by mouth daily.    . busPIRone (BUSPAR) 7.5 MG tablet     . Cyanocobalamin (VITAMIN B-12 PO) Take 1,000 mg by mouth daily.     . DULoxetine (CYMBALTA) 60 MG capsule     . fesoterodine (TOVIAZ) 8 MG TB24 tablet Take 8 mg by mouth daily.    . fluticasone (FLONASE) 50 MCG/ACT nasal spray     . gabapentin (NEURONTIN) 300 MG capsule Take 600 mg by mouth 2 (two) times daily. Takes 1 1/2 tabs daily    . glimepiride (AMARYL) 1 MG tablet     . glucose blood test strip 1 each by Other route as needed for other. Use as instructed    . hydrOXYzine (ATARAX/VISTARIL) 25 MG tablet Take 25 mg by mouth 2 (two) times daily as needed. For itching    . Insulin Glargine (LANTUS SOLOSTAR) 100 UNIT/ML Solostar Pen Inject 30 Units into the skin every morning.     . Insulin Pen Needle (B-D UF III MINI PEN NEEDLES) 31G X 5 MM MISC by Does not apply route.    . metoprolol succinate (TOPROL-XL) 25 MG 24 hr tablet Take 12.5 mg by mouth 2 (two) times daily.    Marland Kitchen NAMZARIC 28-10 MG CP24 Take 1 capsule by mouth daily. 90 capsule 4  . pantoprazole (PROTONIX) 40 MG tablet Take 40 mg by mouth daily.    . sitaGLIPtin (JANUVIA) 100 MG tablet Take 100 mg by mouth daily.    . valsartan-hydrochlorothiazide  (DIOVAN-HCT) 160-25 MG per tablet Take 0.5 tablets by mouth daily.    . rosuvastatin (CRESTOR) 10 MG tablet Take 10 mg by mouth daily.     No current facility-administered medications for this visit.     Allergies as of 09/21/2016 - Review Complete 09/21/2016  Allergen Reaction Noted  . Percocet [oxycodone-acetaminophen] Nausea And Vomiting 09/30/2011  . Latex Itching and Rash 11/22/2011    Vitals: BP (!) 145/56   Pulse 62  Last Weight:  Wt Readings from Last 1 Encounters:  11/04/15 175 lb 9.6 oz (79.7 kg)   Last Height:   Ht Readings from Last 1 Encounters:  11/04/15 '5\' 1"'$  (1.549 m)   Physical exam: Exam: Gen: NAD, conversant              Eyes: Conjunctivae clear without exudates or hemorrhage  Neuro: Detailed Neurologic Exam  Speech:    Speech is normal; fluent and spontaneous with normal comprehension.  Cognition:    The patient is oriented to person, Cranial Nerves:    The pupils are equal, round, and reactive to light. Extraocular movements are intact. Trigeminal sensation is intact and the muscles of mastication are normal. The face is symmetric. The palate elevates in the midline. Hearing intact. Voice is normal. Shoulder shrug is normal. The tongue has normal motion without fasciculations.  Motor Observation:    No asymmetry, no atrophy, and no involuntary movements noted. Tone:    Normal muscle tone.    Posture:    Posture is mildly stooped    Strength:  Weakness globally in the upper extremitites with giveway likely due to pain, more proximal but definitely throughout dital to proximal Mild hip flexion weakness otherwise strength 5/5         Sensation: intact to LT     Reflex Exam:  DTR's:    Absent AJs otherwise brisk for age.     Assessment/Plan:82woman presenting  for follow up evaluation of cognitive decline. Patient and daughter note it is predominantly a problem with short term memory and they feel it has been getting progressively better  with Namzeric. MRI brain and lab workup unremarkable. Stable  Here for a new problem, . Proximal weakness primarily in the upper extremities but also in the lower extremities with brisk reflexes. Need an MRI of the brain to check for strokes or other lesions. Need an MRI of the cervical spine to look for myelopathy are other etiology given brisk reflexes and weakness upper and lower motor neuron deficit.  Labs:  Orders Placed This Encounter  Procedures  . MR BRAIN WO CONTRAST  . MR CERVICAL SPINE WO CONTRAST  . Sedimentation rate  . C-reactive protein  . Acetylcholine receptor, binding  . Acetylcholine receptor, blocking  . Acetylcholine receptor, modulating  . CK  . Paraneoplastic Profile 1  . Magnesium  . Comprehensive metabolic panel  . CBC  . Hemoglobin A1C  . B12 and Folate Panel  . Methylmalonic acid, serum  . TSH  . Multiple Myeloma Panel (SPEP&IFE w/QIG)  . NCV with EMG(electromyography)     EMG: both arms to eval for CTS vs radic, one leg .  As far as your medications are concerned, I would like to suggest: Continue Namzeric for cognitive decline and cymbalta/neurontin for neuropathy  As far as your medications are concerned, I would like to suggest: Recommend '1200mg'$  daily in divided doses with Vitamin D 1000-2000IU daily (for example, caltrate D has '600mg'$  calcium and 800IU vitamin D could take that 2x a day)   Discussed driving with daughter and patient. No accidents, no issues, not getting lost. Family has very good judgement and patient has good insight. Very low threshold for stopping driving if getting lost or any signs of decreased safety, discussed risks at length.   Cc: Dr. Baird Cancer, MD  [facility] [address] [place], [place] [address]  Phone [phone] Fax [phone]  A total of 45 minutes was spent face-to-face with this patient. Over half this time was spent on counseling patient on the poximal  weakness diagnosis and different diagnostic and therapeutic options available.

## 2016-09-21 NOTE — Telephone Encounter (Signed)
Linda Oliver- can you call pt and let her know it will be fine. It will not affect her testing.

## 2016-09-26 ENCOUNTER — Telehealth: Payer: Self-pay | Admitting: *Deleted

## 2016-09-26 NOTE — Telephone Encounter (Signed)
-----   Message from Melvenia Beam, MD sent at 09/26/2016  4:48 PM EDT ----- Labs thus far showed elevated glucose (329) and elevated HgbA1c(8.7) which is in the diabetic range and appears poorly controlled. Needs close follow up with primary care, this can be making her tired and weak, seems to be worsened than in the past (last hgba1c I have is 7.2 which was 7 years ago). We can discuss at emg/ncs.

## 2016-09-26 NOTE — Telephone Encounter (Signed)
LVM for pt to call about results. Gave [facility] phone number.  

## 2016-09-27 DIAGNOSIS — R413 Other amnesia: Secondary | ICD-10-CM | POA: Diagnosis not present

## 2016-09-27 DIAGNOSIS — M542 Cervicalgia: Secondary | ICD-10-CM | POA: Diagnosis not present

## 2016-09-27 DIAGNOSIS — R202 Paresthesia of skin: Secondary | ICD-10-CM | POA: Diagnosis not present

## 2016-09-27 DIAGNOSIS — R29898 Other symptoms and signs involving the musculoskeletal system: Secondary | ICD-10-CM | POA: Diagnosis not present

## 2016-09-27 DIAGNOSIS — M5021 Other cervical disc displacement,  high cervical region: Secondary | ICD-10-CM | POA: Diagnosis not present

## 2016-09-27 DIAGNOSIS — G729 Myopathy, unspecified: Secondary | ICD-10-CM | POA: Diagnosis not present

## 2016-09-27 DIAGNOSIS — M47812 Spondylosis without myelopathy or radiculopathy, cervical region: Secondary | ICD-10-CM | POA: Diagnosis not present

## 2016-09-27 DIAGNOSIS — Z8673 Personal history of transient ischemic attack (TIA), and cerebral infarction without residual deficits: Secondary | ICD-10-CM | POA: Diagnosis not present

## 2016-09-28 NOTE — Telephone Encounter (Signed)
Pt aware of results and will contact her PCP, Dr. Shon Baton, for an appt.  Her labs have been faxed to his attention.

## 2016-09-29 ENCOUNTER — Ambulatory Visit (INDEPENDENT_AMBULATORY_CARE_PROVIDER_SITE_OTHER): Payer: Self-pay | Admitting: Neurology

## 2016-09-29 ENCOUNTER — Ambulatory Visit (INDEPENDENT_AMBULATORY_CARE_PROVIDER_SITE_OTHER): Payer: Medicare Other | Admitting: Neurology

## 2016-09-29 DIAGNOSIS — R29898 Other symptoms and signs involving the musculoskeletal system: Secondary | ICD-10-CM

## 2016-09-29 DIAGNOSIS — M79602 Pain in left arm: Secondary | ICD-10-CM

## 2016-09-29 DIAGNOSIS — R202 Paresthesia of skin: Secondary | ICD-10-CM | POA: Diagnosis not present

## 2016-09-29 DIAGNOSIS — M79601 Pain in right arm: Secondary | ICD-10-CM

## 2016-09-29 DIAGNOSIS — R7309 Other abnormal glucose: Secondary | ICD-10-CM

## 2016-09-29 DIAGNOSIS — Z0289 Encounter for other administrative examinations: Secondary | ICD-10-CM

## 2016-09-29 DIAGNOSIS — G5603 Carpal tunnel syndrome, bilateral upper limbs: Secondary | ICD-10-CM

## 2016-09-29 DIAGNOSIS — R531 Weakness: Secondary | ICD-10-CM

## 2016-09-29 DIAGNOSIS — R29818 Other symptoms and signs involving the nervous system: Secondary | ICD-10-CM

## 2016-09-29 DIAGNOSIS — M4712 Other spondylosis with myelopathy, cervical region: Secondary | ICD-10-CM

## 2016-09-29 DIAGNOSIS — M542 Cervicalgia: Secondary | ICD-10-CM

## 2016-09-29 DIAGNOSIS — E538 Deficiency of other specified B group vitamins: Secondary | ICD-10-CM

## 2016-09-29 DIAGNOSIS — G729 Myopathy, unspecified: Secondary | ICD-10-CM

## 2016-09-29 DIAGNOSIS — I639 Cerebral infarction, unspecified: Secondary | ICD-10-CM

## 2016-09-29 DIAGNOSIS — M5412 Radiculopathy, cervical region: Secondary | ICD-10-CM

## 2016-09-29 NOTE — Progress Notes (Signed)
Full Name: Linda Oliver Gender: Female MRN #: [id number] Date of Birth: 1932/05/29    Visit Date: 09/29/2016 09:19 Age: 83 Years 0 Months Old Examining Physician: Sarina Ill, MD  Referring Physician: Precious Reel, MD  History: Proximal weakness primarily in the upper extremities but also in the lower extremities with brisk reflexes, aching muscles in the arms.   Summary: The left median motor nerve showed delayed distal onset latency (4.9 ms, N< 4.4).The right median motor nerve showed delayed distal onset latency (8.0 ms, N<4.4) and reduced amplitude (2.6 mV, N>4). The left peroneal motor nerve showed used amplitude (1.7 V, N>2). The left radial sensory nerve showed reduced amplitude (11 V, N>15). The left sural sensory nerve and the left superficial peroneal sensory nerve showed no response. The left median sensory nerve showed delayed distal peak latency (4.3 ms, N<3.4). The right median sensory nerve showed delayed distal peak latency (4.3 ms, N<3.4) and reduced amplitude (7 V, N>10). The right ulnar sensory nerve showed reduced amplitude (4 V, N>5).  The left median/ulnar (palm) comparison nerve showed prolonged distal peak latency (Median Palm, 3.1 ms, N<2.2) and abnormal peak latency difference (Median Palm-Ulnar Palm, 1 ms, N<0.4) with a relative median delay. The right median/ulnar (palm) comparison nerve showed prolonged distal peak latency (Median Palm, 3.6 ms, N<2.2) and abnormal peak latency difference (Median Palm-Ulnar Palm, 1.6 ms, N<0.4) with a relative median delay. All remaining nerves as detailed in the following tables were within normal limits. All muscles as detailed in the following tables were within normal limits.     Conclusion: There is electrophysiologic evidence of right greater than left moderately severe carpal tunnel syndrome; will refer to orthopedic surgery for evaluation and treatment. There is also a length dependent, axonal, sensorimotor  polyneuropathy. No suggestion of radiculopathy, myopathy or myositis or neuromuscular disorders.  Cc: Precious Reel, MD   Sarina Ill M.D.  [facility] [place], [place] [address] Tel: [phone] Fax: [phone]        [facility]    Nerve / Sites Muscle Latency Ref. Amplitude Ref. Rel Amp Segments Distance Velocity Ref. Area    ms ms mV mV %  cm m/s m/s mVms  L Median - APB     Wrist APB 4.9 ?4.4 5.0 ?4.0 100 Wrist - APB 7   17.9     Upper arm APB 8.8  4.7  94.8 Upper arm - Wrist 20 53 ?49 17.5  R Median - APB     Wrist APB 8.0 ?4.4 2.6 ?4.0 100 Wrist - APB 7   11.9     Upper arm APB 12.1  2.2  84.8 Upper arm - Wrist 20 49 ?49 9.1  L Ulnar - ADM     Wrist ADM 3.3 ?3.3 8.4 ?6.0 100 Wrist - ADM 7   26.1     B.Elbow ADM 6.5  7.9  93.5 B.Elbow - Wrist 16 50 ?49 25.6     A.Elbow ADM 8.0  7.9  99.9 A.Elbow - B.Elbow 8 51 ?49 26.8         A.Elbow - Wrist      L Peroneal - EDB     Ankle EDB 6.2 ?6.5 1.7 ?2.0 100 Ankle - EDB 9   6.5     Fib head EDB 13.0  1.3  76.9 Fib head - Ankle 27 40 ?44 5.5     Pop fossa EDB 15.4  1.5  116 Pop fossa - Fib  head 10 42 ?44 6.3         Pop fossa - Ankle      L Tibial - AH     Ankle AH 5.3 ?5.8 9.5 ?4.0 100 Ankle - AH 9   21.9     Pop fossa AH 13.6  8.4  88 Pop fossa - Ankle 32 38 ?41 21.2               SNC    Nerve / Sites Rec. Site Peak Lat Ref.  Amp Ref. Segments Distance Peak Diff Ref.    ms ms V V  cm ms ms  L Radial - Anatomical snuff box (Forearm)     Forearm Wrist 2.9 ?2.9 11 ?15 Forearm - Wrist 10    L Sural - Ankle (Calf)     Calf Ankle NR ?4.4 NR ?6 Calf - Ankle 14    L Superficial peroneal - Ankle     Lat leg Ankle NR ?4.4 NR ?6 Lat leg - Ankle 14    L Median, Ulnar - Transcarpal comparison     Median Palm Wrist 3.1 ?2.2 19 ?35 Median Palm - Wrist 8       Ulnar Palm Wrist 2.1 ?2.2 5 ?12 Ulnar Palm - Wrist 8          Median Palm - Ulnar Palm  1.0 ?0.4  R Median, Ulnar - Transcarpal comparison      Median Palm Wrist 3.6 ?2.2 7 ?35 Median Palm - Wrist 8       Ulnar Palm Wrist 2.0 ?2.2 10 ?12 Ulnar Palm - Wrist 8          Median Palm - Ulnar Palm  1.6 ?0.4  L Median - Orthodromic (Dig II, Mid palm)     Dig II Wrist 4.3 ?3.4 11 ?10 Dig II - Wrist 13    R Median - Orthodromic (Dig II, Mid palm)     Dig II Wrist 4.3 ?3.4 7 ?10 Dig II - Wrist 13    L Ulnar - Orthodromic, (Dig V, Mid palm)     Dig V Wrist 2.8 ?3.1 7 ?5 Dig V - Wrist 11    R Ulnar - Orthodromic, (Dig V, Mid palm)     Dig V Wrist 2.4 ?3.1 4 ?5 Dig V - Wrist 41                         F  Wave    Nerve F Lat Ref.   ms ms  L Tibial - AH 56.5 ?56.0  L Ulnar - ADM 26.5 ?32.0         EMG full       EMG Summary Table    Spontaneous MUAP Recruitment  Muscle IA Fib PSW Fasc Other Amp Dur. Poly Pattern  L. Deltoid Normal None None None _______ Normal Normal Normal Normal  R. Deltoid Normal None None None _______ Normal Normal Normal Normal  L. Triceps brachii Normal None None None _______ Normal Normal Normal Normal  R. Triceps brachii Normal None None None _______ Normal Normal Normal Normal  L. Pronator teres Normal None None None _______ Normal Normal Normal Normal  R. Pronator teres Normal None None None _______ Normal Normal Normal Normal  L. First dorsal interosseous Normal None None None _______ Normal Normal Normal Normal  R. First dorsal interosseous Normal None None None _______ Normal Normal Normal Normal  L. Opponens pollicis Normal None  None None _______ Normal Normal Normal Normal  R. Opponens pollicis Normal None None None _______ Normal Normal Normal Normal  L. Iliopsoas Normal None None None _______ Normal Normal Normal Normal  L. Vastus lateralis Normal None None None _______ Normal Normal Normal Normal  L. Vastus medialis Normal None None None _______ Normal Normal Normal Normal  L. Tibialis anterior Normal None None None _______ Normal Normal Normal Normal  L. Gastrocnemius (Medial head) Normal None None  None _______ Normal Normal Normal Normal

## 2016-09-29 NOTE — Progress Notes (Signed)
See procedure note.

## 2016-09-30 LAB — COMPREHENSIVE METABOLIC PANEL
A/G RATIO: 1.3 (ref 1.2–2.2)
ALK PHOS: 144 IU/L — AB (ref 39–117)
ALT: 8 IU/L (ref 0–32)
AST: 14 IU/L (ref 0–40)
Albumin: 3.9 g/dL (ref 3.5–4.7)
BILIRUBIN TOTAL: 0.4 mg/dL (ref 0.0–1.2)
BUN/Creatinine Ratio: 14 (ref 12–28)
BUN: 12 mg/dL (ref 8–27)
CHLORIDE: 97 mmol/L (ref 96–106)
CO2: 28 mmol/L (ref 20–29)
Calcium: 9.8 mg/dL (ref 8.7–10.3)
Creatinine, Ser: 0.83 mg/dL (ref 0.57–1.00)
GFR calc Af Amer: 76 mL/min/{1.73_m2} (ref >59)
GFR calc non Af Amer: 66 mL/min/{1.73_m2} (ref >59)
Globulin, Total: 2.9 g/dL (ref 1.5–4.5)
Glucose: 329 mg/dL — ABNORMAL HIGH (ref 65–99)
POTASSIUM: 4.3 mmol/L (ref 3.5–5.2)
Sodium: 140 mmol/L (ref 134–144)
Total Protein: 6.8 g/dL (ref 6.0–8.5)

## 2016-09-30 LAB — ACETYLCHOLINE RECEPTOR, BLOCKING: Acetylchol Block Ab: 23 % (ref 0–25)

## 2016-09-30 LAB — MULTIPLE MYELOMA PANEL, SERUM
Albumin SerPl Elph-Mcnc: 3.5 g/dL (ref 2.9–4.4)
Albumin/Glob SerPl: 1.1 (ref 0.7–1.7)
Alpha 1: 0.2 g/dL (ref 0.0–0.4)
Alpha2 Glob SerPl Elph-Mcnc: 0.9 g/dL (ref 0.4–1.0)
B-GLOBULIN SERPL ELPH-MCNC: 1.1 g/dL (ref 0.7–1.3)
GLOBULIN, TOTAL: 3.3 g/dL (ref 2.2–3.9)
Gamma Glob SerPl Elph-Mcnc: 1.1 g/dL (ref 0.4–1.8)
IGG (IMMUNOGLOBIN G), SERUM: 1018 mg/dL (ref 700–1600)
IgA/Immunoglobulin A, Serum: 255 mg/dL (ref 64–422)
IgM (Immunoglobulin M), Srm: 54 mg/dL (ref 26–217)

## 2016-09-30 LAB — PARANEOPLASTIC PROFILE 1

## 2016-09-30 LAB — CBC
HEMOGLOBIN: 13.1 g/dL (ref 11.1–15.9)
Hematocrit: 41.4 % (ref 34.0–46.6)
MCH: 27.3 pg (ref 26.6–33.0)
MCHC: 31.6 g/dL (ref 31.5–35.7)
MCV: 86 fL (ref 79–97)
PLATELETS: 263 10*3/uL (ref 150–379)
RBC: 4.8 x10E6/uL (ref 3.77–5.28)
RDW: 15.6 % — ABNORMAL HIGH (ref 12.3–15.4)
WBC: 7 10*3/uL (ref 3.4–10.8)

## 2016-09-30 LAB — SEDIMENTATION RATE: SED RATE: 7 mm/h (ref 0–40)

## 2016-09-30 LAB — ACETYLCHOLINE RECEPTOR, MODULATING: Acetylcholine Modulat Ab: <12 % (ref 0–20)

## 2016-09-30 LAB — MAGNESIUM: MAGNESIUM: 2 mg/dL (ref 1.6–2.3)

## 2016-09-30 LAB — HEMOGLOBIN A1C
ESTIMATED AVERAGE GLUCOSE: 203 mg/dL
HEMOGLOBIN A1C: 8.7 % — AB (ref 4.8–5.6)

## 2016-09-30 LAB — ACETYLCHOLINE RECEPTOR, BINDING: AChR Binding Ab, Serum: <0.03 nmol/L (ref 0.00–0.24)

## 2016-09-30 LAB — B12 AND FOLATE PANEL
Folate: >20 ng/mL (ref >3.0)
Vitamin B-12: >2000 pg/mL — ABNORMAL HIGH (ref 232–1245)

## 2016-09-30 LAB — METHYLMALONIC ACID, SERUM: METHYLMALONIC ACID: 233 nmol/L (ref 0–378)

## 2016-09-30 LAB — TSH: TSH: 3.04 u[IU]/mL (ref 0.450–4.500)

## 2016-09-30 LAB — CK: CK TOTAL: 32 U/L (ref 24–173)

## 2016-09-30 LAB — C-REACTIVE PROTEIN: CRP: 2.4 mg/L (ref 0.0–4.9)

## 2016-10-03 DIAGNOSIS — R58 Hemorrhage, not elsewhere classified: Secondary | ICD-10-CM | POA: Diagnosis not present

## 2016-10-03 DIAGNOSIS — N3281 Overactive bladder: Secondary | ICD-10-CM | POA: Diagnosis not present

## 2016-10-03 DIAGNOSIS — W19XXXA Unspecified fall, initial encounter: Secondary | ICD-10-CM | POA: Diagnosis not present

## 2016-10-03 DIAGNOSIS — R531 Weakness: Secondary | ICD-10-CM | POA: Diagnosis not present

## 2016-10-03 DIAGNOSIS — E1149 Type 2 diabetes mellitus with other diabetic neurological complication: Secondary | ICD-10-CM | POA: Diagnosis not present

## 2016-10-03 DIAGNOSIS — I10 Essential (primary) hypertension: Secondary | ICD-10-CM | POA: Diagnosis not present

## 2016-10-03 DIAGNOSIS — Z6832 Body mass index (BMI) 32.0-32.9, adult: Secondary | ICD-10-CM | POA: Diagnosis not present

## 2016-10-03 DIAGNOSIS — M25511 Pain in right shoulder: Secondary | ICD-10-CM | POA: Diagnosis not present

## 2016-10-05 NOTE — Procedures (Signed)
Full Name: Linda Oliver Gender: Female MRN #: [id number] Date of Birth: 1932-12-25    Visit Date: 09/29/2016 09:19 Age: 83 Years 0 Months Old Examining Physician: Sarina Ill, MD  Referring Physician: Precious Reel, MD  History: Proximal weakness primarily in the upper extremities but also in the lower extremities with brisk reflexes, aching muscles in the arms.   Summary: The left median motor nerve showed delayed distal onset latency (4.9 ms, N< 4.4).The right median motor nerve showed delayed distal onset latency (8.0 ms, N<4.4) and reduced amplitude (2.6 mV, N>4). The left peroneal motor nerve showed used amplitude (1.7 V, N>2). The left radial sensory nerve showed reduced amplitude (11 V, N>15). The left sural sensory nerve and the left superficial peroneal sensory nerve showed no response. The left median sensory nerve showed delayed distal peak latency (4.3 ms, N<3.4). The right median sensory nerve showed delayed distal peak latency (4.3 ms, N<3.4) and reduced amplitude (7 V, N>10). The right ulnar sensory nerve showed reduced amplitude (4 V, N>5).  The left median/ulnar (palm) comparison nerve showed prolonged distal peak latency (Median Palm, 3.1 ms, N<2.2) and abnormal peak latency difference (Median Palm-Ulnar Palm, 1 ms, N<0.4) with a relative median delay. The right median/ulnar (palm) comparison nerve showed prolonged distal peak latency (Median Palm, 3.6 ms, N<2.2) and abnormal peak latency difference (Median Palm-Ulnar Palm, 1.6 ms, N<0.4) with a relative median delay. All remaining nerves as detailed in the following tables were within normal limits. All muscles as detailed in the following tables were within normal limits.     Conclusion: There is electrophysiologic evidence of right greater than left moderately severe carpal tunnel syndrome; will refer to orthopedic surgery for evaluation and treatment. There is also a length dependent, axonal, sensorimotor  polyneuropathy. No suggestion of radiculopathy, myopathy or myositis or neuromuscular disorders.  Cc: Precious Reel, MD   Sarina Ill M.D.  [facility] [place], [place] [address] Tel: [phone] Fax: [phone]        [facility]    Nerve / Sites Muscle Latency Ref. Amplitude Ref. Rel Amp Segments Distance Velocity Ref. Area    ms ms mV mV %  cm m/s m/s mVms  L Median - APB     Wrist APB 4.9 ?4.4 5.0 ?4.0 100 Wrist - APB 7   17.9     Upper arm APB 8.8  4.7  94.8 Upper arm - Wrist 20 53 ?49 17.5  R Median - APB     Wrist APB 8.0 ?4.4 2.6 ?4.0 100 Wrist - APB 7   11.9     Upper arm APB 12.1  2.2  84.8 Upper arm - Wrist 20 49 ?49 9.1  L Ulnar - ADM     Wrist ADM 3.3 ?3.3 8.4 ?6.0 100 Wrist - ADM 7   26.1     B.Elbow ADM 6.5  7.9  93.5 B.Elbow - Wrist 16 50 ?49 25.6     A.Elbow ADM 8.0  7.9  99.9 A.Elbow - B.Elbow 8 51 ?49 26.8         A.Elbow - Wrist      L Peroneal - EDB     Ankle EDB 6.2 ?6.5 1.7 ?2.0 100 Ankle - EDB 9   6.5     Fib head EDB 13.0  1.3  76.9 Fib head - Ankle 27 40 ?44 5.5     Pop fossa EDB 15.4  1.5  116 Pop fossa - Fib  head 10 42 ?44 6.3         Pop fossa - Ankle      L Tibial - AH     Ankle AH 5.3 ?5.8 9.5 ?4.0 100 Ankle - AH 9   21.9     Pop fossa AH 13.6  8.4  88 Pop fossa - Ankle 32 38 ?41 21.2               SNC    Nerve / Sites Rec. Site Peak Lat Ref.  Amp Ref. Segments Distance Peak Diff Ref.    ms ms V V  cm ms ms  L Radial - Anatomical snuff box (Forearm)     Forearm Wrist 2.9 ?2.9 11 ?15 Forearm - Wrist 10    L Sural - Ankle (Calf)     Calf Ankle NR ?4.4 NR ?6 Calf - Ankle 14    L Superficial peroneal - Ankle     Lat leg Ankle NR ?4.4 NR ?6 Lat leg - Ankle 14    L Median, Ulnar - Transcarpal comparison     Median Palm Wrist 3.1 ?2.2 19 ?35 Median Palm - Wrist 8       Ulnar Palm Wrist 2.1 ?2.2 5 ?12 Ulnar Palm - Wrist 8          Median Palm - Ulnar Palm  1.0 ?0.4  R Median, Ulnar - Transcarpal comparison      Median Palm Wrist 3.6 ?2.2 7 ?35 Median Palm - Wrist 8       Ulnar Palm Wrist 2.0 ?2.2 10 ?12 Ulnar Palm - Wrist 8          Median Palm - Ulnar Palm  1.6 ?0.4  L Median - Orthodromic (Dig II, Mid palm)     Dig II Wrist 4.3 ?3.4 11 ?10 Dig II - Wrist 13    R Median - Orthodromic (Dig II, Mid palm)     Dig II Wrist 4.3 ?3.4 7 ?10 Dig II - Wrist 13    L Ulnar - Orthodromic, (Dig V, Mid palm)     Dig V Wrist 2.8 ?3.1 7 ?5 Dig V - Wrist 11    R Ulnar - Orthodromic, (Dig V, Mid palm)     Dig V Wrist 2.4 ?3.1 4 ?5 Dig V - Wrist 22                         F  Wave    Nerve F Lat Ref.   ms ms  L Tibial - AH 56.5 ?56.0  L Ulnar - ADM 26.5 ?32.0         EMG full       EMG Summary Table    Spontaneous MUAP Recruitment  Muscle IA Fib PSW Fasc Other Amp Dur. Poly Pattern  L. Deltoid Normal None None None _______ Normal Normal Normal Normal  R. Deltoid Normal None None None _______ Normal Normal Normal Normal  L. Triceps brachii Normal None None None _______ Normal Normal Normal Normal  R. Triceps brachii Normal None None None _______ Normal Normal Normal Normal  L. Pronator teres Normal None None None _______ Normal Normal Normal Normal  R. Pronator teres Normal None None None _______ Normal Normal Normal Normal  L. First dorsal interosseous Normal None None None _______ Normal Normal Normal Normal  R. First dorsal interosseous Normal None None None _______ Normal Normal Normal Normal  L. Opponens pollicis Normal None  None None _______ Normal Normal Normal Normal  R. Opponens pollicis Normal None None None _______ Normal Normal Normal Normal  L. Iliopsoas Normal None None None _______ Normal Normal Normal Normal  L. Vastus lateralis Normal None None None _______ Normal Normal Normal Normal  L. Vastus medialis Normal None None None _______ Normal Normal Normal Normal  L. Tibialis anterior Normal None None None _______ Normal Normal Normal Normal  L. Gastrocnemius (Medial head) Normal None None  None _______ Normal Normal Normal Normal

## 2016-10-06 DIAGNOSIS — I119 Hypertensive heart disease without heart failure: Secondary | ICD-10-CM | POA: Diagnosis not present

## 2016-10-06 DIAGNOSIS — E669 Obesity, unspecified: Secondary | ICD-10-CM | POA: Diagnosis not present

## 2016-10-06 DIAGNOSIS — I2510 Atherosclerotic heart disease of native coronary artery without angina pectoris: Secondary | ICD-10-CM | POA: Diagnosis not present

## 2016-10-06 DIAGNOSIS — M1990 Unspecified osteoarthritis, unspecified site: Secondary | ICD-10-CM | POA: Diagnosis not present

## 2016-10-06 DIAGNOSIS — I272 Pulmonary hypertension, unspecified: Secondary | ICD-10-CM | POA: Diagnosis not present

## 2016-10-06 DIAGNOSIS — E1140 Type 2 diabetes mellitus with diabetic neuropathy, unspecified: Secondary | ICD-10-CM | POA: Diagnosis not present

## 2016-10-06 DIAGNOSIS — M859 Disorder of bone density and structure, unspecified: Secondary | ICD-10-CM | POA: Diagnosis not present

## 2016-10-06 DIAGNOSIS — E1151 Type 2 diabetes mellitus with diabetic peripheral angiopathy without gangrene: Secondary | ICD-10-CM | POA: Diagnosis not present

## 2016-10-06 DIAGNOSIS — S8991XD Unspecified injury of right lower leg, subsequent encounter: Secondary | ICD-10-CM | POA: Diagnosis not present

## 2016-10-06 DIAGNOSIS — R296 Repeated falls: Secondary | ICD-10-CM | POA: Diagnosis not present

## 2016-10-06 DIAGNOSIS — G909 Disorder of the autonomic nervous system, unspecified: Secondary | ICD-10-CM | POA: Diagnosis not present

## 2016-10-06 DIAGNOSIS — M25511 Pain in right shoulder: Secondary | ICD-10-CM | POA: Diagnosis not present

## 2016-10-07 DIAGNOSIS — E1140 Type 2 diabetes mellitus with diabetic neuropathy, unspecified: Secondary | ICD-10-CM | POA: Diagnosis not present

## 2016-10-07 DIAGNOSIS — E1151 Type 2 diabetes mellitus with diabetic peripheral angiopathy without gangrene: Secondary | ICD-10-CM | POA: Diagnosis not present

## 2016-10-07 DIAGNOSIS — M25511 Pain in right shoulder: Secondary | ICD-10-CM | POA: Diagnosis not present

## 2016-10-07 DIAGNOSIS — M1990 Unspecified osteoarthritis, unspecified site: Secondary | ICD-10-CM | POA: Diagnosis not present

## 2016-10-07 DIAGNOSIS — I2510 Atherosclerotic heart disease of native coronary artery without angina pectoris: Secondary | ICD-10-CM | POA: Diagnosis not present

## 2016-10-07 DIAGNOSIS — S8991XD Unspecified injury of right lower leg, subsequent encounter: Secondary | ICD-10-CM | POA: Diagnosis not present

## 2016-10-12 DIAGNOSIS — I2510 Atherosclerotic heart disease of native coronary artery without angina pectoris: Secondary | ICD-10-CM | POA: Diagnosis not present

## 2016-10-12 DIAGNOSIS — M25511 Pain in right shoulder: Secondary | ICD-10-CM | POA: Diagnosis not present

## 2016-10-12 DIAGNOSIS — S8991XD Unspecified injury of right lower leg, subsequent encounter: Secondary | ICD-10-CM | POA: Diagnosis not present

## 2016-10-12 DIAGNOSIS — M1990 Unspecified osteoarthritis, unspecified site: Secondary | ICD-10-CM | POA: Diagnosis not present

## 2016-10-12 DIAGNOSIS — E1140 Type 2 diabetes mellitus with diabetic neuropathy, unspecified: Secondary | ICD-10-CM | POA: Diagnosis not present

## 2016-10-12 DIAGNOSIS — E1151 Type 2 diabetes mellitus with diabetic peripheral angiopathy without gangrene: Secondary | ICD-10-CM | POA: Diagnosis not present

## 2016-10-14 DIAGNOSIS — R0989 Other specified symptoms and signs involving the circulatory and respiratory systems: Secondary | ICD-10-CM | POA: Diagnosis not present

## 2016-10-14 DIAGNOSIS — M1990 Unspecified osteoarthritis, unspecified site: Secondary | ICD-10-CM | POA: Diagnosis not present

## 2016-10-14 DIAGNOSIS — R0609 Other forms of dyspnea: Secondary | ICD-10-CM | POA: Diagnosis not present

## 2016-10-14 DIAGNOSIS — E1151 Type 2 diabetes mellitus with diabetic peripheral angiopathy without gangrene: Secondary | ICD-10-CM | POA: Diagnosis not present

## 2016-10-14 DIAGNOSIS — S8991XD Unspecified injury of right lower leg, subsequent encounter: Secondary | ICD-10-CM | POA: Diagnosis not present

## 2016-10-14 DIAGNOSIS — I2510 Atherosclerotic heart disease of native coronary artery without angina pectoris: Secondary | ICD-10-CM | POA: Diagnosis not present

## 2016-10-14 DIAGNOSIS — R55 Syncope and collapse: Secondary | ICD-10-CM | POA: Diagnosis not present

## 2016-10-14 DIAGNOSIS — E1140 Type 2 diabetes mellitus with diabetic neuropathy, unspecified: Secondary | ICD-10-CM | POA: Diagnosis not present

## 2016-10-14 DIAGNOSIS — M25511 Pain in right shoulder: Secondary | ICD-10-CM | POA: Diagnosis not present

## 2016-10-17 DIAGNOSIS — E1151 Type 2 diabetes mellitus with diabetic peripheral angiopathy without gangrene: Secondary | ICD-10-CM | POA: Diagnosis not present

## 2016-10-17 DIAGNOSIS — S8991XD Unspecified injury of right lower leg, subsequent encounter: Secondary | ICD-10-CM | POA: Diagnosis not present

## 2016-10-17 DIAGNOSIS — E1140 Type 2 diabetes mellitus with diabetic neuropathy, unspecified: Secondary | ICD-10-CM | POA: Diagnosis not present

## 2016-10-17 DIAGNOSIS — M1990 Unspecified osteoarthritis, unspecified site: Secondary | ICD-10-CM | POA: Diagnosis not present

## 2016-10-17 DIAGNOSIS — I2510 Atherosclerotic heart disease of native coronary artery without angina pectoris: Secondary | ICD-10-CM | POA: Diagnosis not present

## 2016-10-17 DIAGNOSIS — M25511 Pain in right shoulder: Secondary | ICD-10-CM | POA: Diagnosis not present

## 2016-10-19 DIAGNOSIS — E1140 Type 2 diabetes mellitus with diabetic neuropathy, unspecified: Secondary | ICD-10-CM | POA: Diagnosis not present

## 2016-10-19 DIAGNOSIS — S8991XD Unspecified injury of right lower leg, subsequent encounter: Secondary | ICD-10-CM | POA: Diagnosis not present

## 2016-10-19 DIAGNOSIS — M25511 Pain in right shoulder: Secondary | ICD-10-CM | POA: Diagnosis not present

## 2016-10-19 DIAGNOSIS — I2510 Atherosclerotic heart disease of native coronary artery without angina pectoris: Secondary | ICD-10-CM | POA: Diagnosis not present

## 2016-10-19 DIAGNOSIS — E1151 Type 2 diabetes mellitus with diabetic peripheral angiopathy without gangrene: Secondary | ICD-10-CM | POA: Diagnosis not present

## 2016-10-19 DIAGNOSIS — M1990 Unspecified osteoarthritis, unspecified site: Secondary | ICD-10-CM | POA: Diagnosis not present

## 2016-10-20 DIAGNOSIS — G5602 Carpal tunnel syndrome, left upper limb: Secondary | ICD-10-CM | POA: Diagnosis not present

## 2016-10-20 DIAGNOSIS — G5601 Carpal tunnel syndrome, right upper limb: Secondary | ICD-10-CM | POA: Diagnosis not present

## 2016-10-24 DIAGNOSIS — I10 Essential (primary) hypertension: Secondary | ICD-10-CM | POA: Diagnosis not present

## 2016-10-24 DIAGNOSIS — I2510 Atherosclerotic heart disease of native coronary artery without angina pectoris: Secondary | ICD-10-CM | POA: Diagnosis not present

## 2016-10-24 DIAGNOSIS — R0602 Shortness of breath: Secondary | ICD-10-CM | POA: Diagnosis not present

## 2016-10-24 DIAGNOSIS — R55 Syncope and collapse: Secondary | ICD-10-CM | POA: Diagnosis not present

## 2016-10-26 DIAGNOSIS — E1151 Type 2 diabetes mellitus with diabetic peripheral angiopathy without gangrene: Secondary | ICD-10-CM | POA: Diagnosis not present

## 2016-10-26 DIAGNOSIS — M1990 Unspecified osteoarthritis, unspecified site: Secondary | ICD-10-CM | POA: Diagnosis not present

## 2016-10-26 DIAGNOSIS — S8991XD Unspecified injury of right lower leg, subsequent encounter: Secondary | ICD-10-CM | POA: Diagnosis not present

## 2016-10-26 DIAGNOSIS — M25511 Pain in right shoulder: Secondary | ICD-10-CM | POA: Diagnosis not present

## 2016-10-26 DIAGNOSIS — E1140 Type 2 diabetes mellitus with diabetic neuropathy, unspecified: Secondary | ICD-10-CM | POA: Diagnosis not present

## 2016-10-26 DIAGNOSIS — I2510 Atherosclerotic heart disease of native coronary artery without angina pectoris: Secondary | ICD-10-CM | POA: Diagnosis not present

## 2016-10-28 DIAGNOSIS — I2510 Atherosclerotic heart disease of native coronary artery without angina pectoris: Secondary | ICD-10-CM | POA: Diagnosis not present

## 2016-10-28 DIAGNOSIS — E1151 Type 2 diabetes mellitus with diabetic peripheral angiopathy without gangrene: Secondary | ICD-10-CM | POA: Diagnosis not present

## 2016-10-28 DIAGNOSIS — M1990 Unspecified osteoarthritis, unspecified site: Secondary | ICD-10-CM | POA: Diagnosis not present

## 2016-10-28 DIAGNOSIS — E1140 Type 2 diabetes mellitus with diabetic neuropathy, unspecified: Secondary | ICD-10-CM | POA: Diagnosis not present

## 2016-10-28 DIAGNOSIS — M25511 Pain in right shoulder: Secondary | ICD-10-CM | POA: Diagnosis not present

## 2016-10-28 DIAGNOSIS — S8991XD Unspecified injury of right lower leg, subsequent encounter: Secondary | ICD-10-CM | POA: Diagnosis not present

## 2016-10-31 DIAGNOSIS — S8991XD Unspecified injury of right lower leg, subsequent encounter: Secondary | ICD-10-CM | POA: Diagnosis not present

## 2016-10-31 DIAGNOSIS — M25511 Pain in right shoulder: Secondary | ICD-10-CM | POA: Diagnosis not present

## 2016-10-31 DIAGNOSIS — M1990 Unspecified osteoarthritis, unspecified site: Secondary | ICD-10-CM | POA: Diagnosis not present

## 2016-10-31 DIAGNOSIS — I2510 Atherosclerotic heart disease of native coronary artery without angina pectoris: Secondary | ICD-10-CM | POA: Diagnosis not present

## 2016-10-31 DIAGNOSIS — E1151 Type 2 diabetes mellitus with diabetic peripheral angiopathy without gangrene: Secondary | ICD-10-CM | POA: Diagnosis not present

## 2016-10-31 DIAGNOSIS — E1140 Type 2 diabetes mellitus with diabetic neuropathy, unspecified: Secondary | ICD-10-CM | POA: Diagnosis not present

## 2016-11-02 DIAGNOSIS — I2510 Atherosclerotic heart disease of native coronary artery without angina pectoris: Secondary | ICD-10-CM | POA: Diagnosis not present

## 2016-11-02 DIAGNOSIS — E1140 Type 2 diabetes mellitus with diabetic neuropathy, unspecified: Secondary | ICD-10-CM | POA: Diagnosis not present

## 2016-11-02 DIAGNOSIS — M25511 Pain in right shoulder: Secondary | ICD-10-CM | POA: Diagnosis not present

## 2016-11-02 DIAGNOSIS — S8991XD Unspecified injury of right lower leg, subsequent encounter: Secondary | ICD-10-CM | POA: Diagnosis not present

## 2016-11-02 DIAGNOSIS — M1990 Unspecified osteoarthritis, unspecified site: Secondary | ICD-10-CM | POA: Diagnosis not present

## 2016-11-02 DIAGNOSIS — E1151 Type 2 diabetes mellitus with diabetic peripheral angiopathy without gangrene: Secondary | ICD-10-CM | POA: Diagnosis not present

## 2016-11-02 DIAGNOSIS — Z23 Encounter for immunization: Secondary | ICD-10-CM | POA: Diagnosis not present

## 2016-11-08 ENCOUNTER — Ambulatory Visit (INDEPENDENT_AMBULATORY_CARE_PROVIDER_SITE_OTHER): Payer: Medicare Other | Admitting: Neurology

## 2016-11-08 ENCOUNTER — Encounter: Payer: Self-pay | Admitting: Neurology

## 2016-11-08 VITALS — BP 128/62 | HR 61 | Wt 176.2 lb

## 2016-11-08 DIAGNOSIS — I639 Cerebral infarction, unspecified: Secondary | ICD-10-CM

## 2016-11-08 DIAGNOSIS — G3184 Mild cognitive impairment, so stated: Secondary | ICD-10-CM

## 2016-11-08 NOTE — Progress Notes (Addendum)
[facility]   Provider: Dr Jaynee Eagles Referring Provider: Shon Baton, MD Primary Care Physician: Precious Reel, MD CC: Memory loss  MRI brain 09/27/2016: nothing acute, remote small infarct superior left cerebellar hemisphere, moderate cerebral atrophy and mild cerebellar atrophy.   Interval history 11/08/2016: Feels her memory is stable.Discussed MCI, formal neurocognitive testing. Discussed clinical trials including [employer]bl[employer]ers. She is already on Aricept and Namenda. Feels her memory is stable. Here with her son-in-law who also provides information. Provided information all of the above. I also reviewed MRI of the brain and cervical spine. 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 She had some degenerative changes in the neck but no myelopathy or cervical stenosis. EMG nerve conduction study showed peripheral neuropathy, bilateral carpal tunnel syndrome but no etiology for her proximal weakness such as myopathy myositis or neuromuscular disorder.  Interval history 09/21/2016: She is here with weakness in the arms. She was taken off of crestor with no release. She has an aching in her arms, can be a 10/10. Started 3 months ago when she started using the piano. She wuite playing the piano. She has neck problems and a stiff neck. More in the forearm and proximal arm. No difficulty lifting arms overhead, doesn't get tired easily brushing hair, can't lift the iron skillet. No problem with the legs. Symptoms are symmetric and she has tingling in the fingers. Worse during the day. No ptosis, diplopia, no new swallowing problems or new shortness or breath.   Interval history 11/04/2015: [place] weekend to visit her husband in the [place]. 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 They went to [facility] near [place] for a few  days. Here with daughter who also provides information. It is dog friendly there and they like it their. Holidays at her daughter's house Freda Munro). Feels memory is stable. She is on Namzaric for memory. Cymbalta was increased for anxiety. She was "jumping out of her skin" which has helped with her anxiety. She takes b12 orally every day.   Interval History 10/06/2014: She is doing great. The medication really works for her. She takes oral B12 daily. No side effects from the Namzaric. Feels her memory is stable, no progression. No behavioral symptoms. Some mild anxiety but nothing significant. She is still driving and goes to therapy. She still has some dizziness and is in therapy. She has a recumbent bike that she uses. She is on gabapentin and cymbalta for neuropathy. She is here with her daughter.  Interval update 02/19/2014: HAPPY BEGEMAN is a 83 y.o. female here as a follow up. She is a former patient of Dr. Janann Colonel. Has history of DM, HLD, HTN. Has neuropathy for which she takes Cymbalta and Gabapentin. Has occasional episodes of dizziness which she takes meclizine for. No known family history of cognitive decline. Has been told her B12 is low and has received injections in the past, currently taking oral B12 daily. She feels great, feels Aricept is making a significant difference. No side effects from the medication. She has decreased her Neurontin since starting Cymbalta and would like to try and decrease further.   10/09/2013 visit: MELVENA VINK is a 83 y.o. female here as a follow up from Dr. Virgina Jock for cognitive decline. Initial visit was 03/2013 at which time she had an unremarkable brain MRI and lab workup. She was started on  Aricept 5mg  daily. Returns today with continued concerns over short term memory. Patient notes continued difficulty with getting lost, has a hard time getting around. She will have good and bad days, some days she is more confused and disoriented. Has gotten lost driving a few  times. Her daughter notes some improvement since being on the medication. She feels that the patient is more alert and interactive. Notes some gait instability and dizziness but no falls.   Initial visit 04/09/2013: Daughter notes symptoms have been ongoing for a few years and have gotten progressively worse. Daughter notices she will be telling a story and then just forget what comes next. Trouble with short term memory, difficulty recalling what they just talked about. Currently driving, denies any difficulty, no accidents. Has noted some episodes of getting lost. Wakes up frequently overnight with need to urinate. She notes one episode of a visual hallucination, described as a hazy shadow. Notes some difficulty with remote memory but overall feels that is still intact.   She currently lives alone, her daughter takes care of finances and medications, has been doing this for the past few years. Has been hospitalized a few times in the past with question of stroke, no stroke noted and daughter suspects patients may have been managing her medications incorrectly in the past.      Review of Systems: Patient complains of symptoms per HPI as well as the following symptoms: no CP, no SOB. Pertinent negatives and positives per HPI. All others negative.   Social History   Social History  . Marital status: Married    Spouse name: N/A  . Number of children: 1  . Years of education: 15   Occupational History  . Not on file.   Social History Main Topics  . Smoking status: Former Smoker    Packs/day: 1.00    Years: 30.00    Types: Cigarettes    Quit date: 11/25/1986  . Smokeless tobacco: Never Used  . Alcohol use No  . Drug use: No  . Sexual activity: Yes    Birth control/ protection: Post-menopausal   Other Topics Concern  . Not on file   Social History Narrative   Married, has 1 child   Caffeine use: none    Family History  Problem Relation Age of Onset  . Coronary artery  disease Mother   . Heart disease Father   . Cancer Father        skin  . Cancer Brother   . Cancer - Colon Brother        brain, Lymphoma    Past Medical History:  Diagnosis Date  . Anginal pain ([place])    occ  . Broken ankle   . Bullous pemphigus   . CAD (coronary artery disease)    [place]   80% stenosis ostial Left main, 30% stenosis proximal LAD, 80% stenosis proximal RCA CABG  -  LIMA to LAD, SVG to int, SVG to [place], Deport   . Diabetic peripheral neuropathy ([place])   . GERD (gastroesophageal reflux disease)   . Headache(784.0)    hx migraines  . Heart murmur    mvp  . Hyperlipidemia   . Hypertension   . Hypertensive heart disease   . Nasal bleeding    occ due to O2  . OA (osteoarthritis)   . Obesity (BMI 30-39.9)   . Osteopenia   . Peripheral neuropathy   . Seizure ([place])    2009  .  Sleep apnea    dx 10yrs ago doesnt use cpap but uses O2 at nite 2L  . Stones in the urinary tract   . Type 2 diabetes mellitus with vascular disease ([place])   . UTI (lower urinary tract infection)     Past Surgical History:  Procedure Laterality Date  . ABDOMINAL HYSTERECTOMY  70's  . CARDIAC CATHETERIZATION    . CHOLECYSTECTOMY    . CORONARY ARTERY BYPASS GRAFT  96  . SHOULDER ARTHROSCOPY     rt  . SHOULDER ARTHROSCOPY WITH ROTATOR CUFF REPAIR AND SUBACROMIAL DECOMPRESSION  12/01/2011   Procedure: SHOULDER ARTHROSCOPY WITH ROTATOR CUFF REPAIR AND SUBACROMIAL DECOMPRESSION;  Surgeon: Nita Sells, MD;  Location: [facility];  Service: Orthopedics;  Laterality: Left;  shoulder debridement calcific tendonitis    Current Outpatient Prescriptions  Medication Sig Dispense Refill  . aspirin EC 325 MG tablet Take 325 mg by mouth daily.    . busPIRone (BUSPAR) 7.5 MG tablet     . Cyanocobalamin (VITAMIN B-12 PO) Take 1,000 mg by mouth daily.     . DULoxetine (CYMBALTA) 60 MG capsule     . fesoterodine (TOVIAZ) 8 MG TB24 tablet Take 8 mg by mouth daily.    .  fluticasone (FLONASE) 50 MCG/ACT nasal spray     . gabapentin (NEURONTIN) 300 MG capsule Take 600 mg by mouth 2 (two) times daily. Takes 1 1/2 tabs daily    . glimepiride (AMARYL) 1 MG tablet     . glucose blood test strip 1 each by Other route as needed for other. Use as instructed    . hydrOXYzine (ATARAX/VISTARIL) 25 MG tablet Take 25 mg by mouth 2 (two) times daily as needed. For itching    . Insulin Glargine (LANTUS SOLOSTAR) 100 UNIT/ML Solostar Pen Inject 37 Units into the skin every morning.     . Insulin Pen Needle (B-D UF III MINI PEN NEEDLES) 31G X 5 MM MISC by Does not apply route.    . metoprolol succinate (TOPROL-XL) 25 MG 24 hr tablet Take 12.5 mg by mouth 2 (two) times daily.    Marland Kitchen NAMZARIC 28-10 MG CP24 Take 1 capsule by mouth daily. 90 capsule 4  . pantoprazole (PROTONIX) 40 MG tablet Take 40 mg by mouth daily.    . rosuvastatin (CRESTOR) 10 MG tablet Take 10 mg by mouth daily.    . sitaGLIPtin (JANUVIA) 100 MG tablet Take 100 mg by mouth daily.    . valsartan-hydrochlorothiazide (DIOVAN-HCT) 160-25 MG per tablet Take 0.5 tablets by mouth daily.     No current facility-administered medications for this visit.     Allergies as of 11/08/2016 - Review Complete 11/08/2016  Allergen Reaction Noted  . Percocet [oxycodone-acetaminophen] Nausea And Vomiting 09/30/2011  . Latex Itching and Rash 11/22/2011    Vitals: BP 128/62   Pulse 61   Wt 176 lb 3.2 oz (79.9 kg)   BMI 33.29 kg/m  Last Weight:  Wt Readings from Last 1 Encounters:  11/08/16 176 lb 3.2 oz (79.9 kg)   Last Height:   Ht Readings from Last 1 Encounters:  11/04/15 5\' 1"  (1.549 m)   Neuro: Detailed Neurologic Exam  Speech:    Speech is normal; fluent and spontaneous with normal comprehension.  Cognition:  MMSE - Mini Mental State Exam 11/08/2016  Orientation to time 5  Orientation to Place 5  Registration 3  Attention/ Calculation 5  Recall 2  Language- name 2 objects 2  Language- repeat 1  Language- follow 3 step command 3  Language- read & follow direction 1  Write a sentence 1  Copy design 1  Total score 29   Cranial Nerves:    The pupils are equal, round, and reactive to light. Extraocular movements are intact. Trigeminal sensation is intact and the muscles of mastication are normal. The face is symmetric. The palate elevates in the midline. Hearing intact. Voice is normal. Shoulder shrug is normal. The tongue has normal motion without fasciculations.  Motor Observation:    No asymmetry, no atrophy, and no involuntary movements noted. Tone:    Normal muscle tone.    Posture:    Posture is mildly stooped    Strength:  Weakness globally in the upper extremitites with giveway likely due to pain, more proximal but definitely throughout dital to proximal Mild hip flexion weakness otherwise strength 5/5         Sensation: intact to LT     Reflex Exam:  DTR's:    Absent AJs otherwise brisk for age.       Assessment/Plan:84 woman presenting for follow up evaluation of stable cognitive decline. Patient and daughter and son-in-law note it is predominantly a problem with short term memory and they feel it has been getting progressively better with Namzeric. MRI brain and lab workup unremarkable. Stable  - Discussed MCI, formal neurocognitive testing. Discussed clinical trials including Trailblazers. She is already on Aricept and Namenda. Feels her memory is stable.   - MRI of the brain and cervical spine completed in 09/2016. 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 She had some degenerative changes in the neck but no myelopathy or cervical stenosis.    Proximal weakness primarily in the upper extremities: EMG nerve conduction study showed peripheral neuropathy, bilateral carpal tunnel syndrome but no etiology for her  proximal weakness such as myopathy myositis or neuromuscular disorder.  They decline further workup including clinical trials, formal neurocognitive testing. Continue Namenda and Aricept.  Sarina Ill, MD  [facility] [address] [place], [place] [address]  Phone [phone] Fax [phone]  A total of 15 minutes was spent face-to-face with this patient. Over half this time was spent on counseling patient on the MCI diagnosis and different diagnostic and therapeutic options available.

## 2016-11-12 DIAGNOSIS — R55 Syncope and collapse: Secondary | ICD-10-CM | POA: Diagnosis not present

## 2016-11-16 DIAGNOSIS — I2510 Atherosclerotic heart disease of native coronary artery without angina pectoris: Secondary | ICD-10-CM | POA: Diagnosis not present

## 2016-11-16 DIAGNOSIS — R55 Syncope and collapse: Secondary | ICD-10-CM | POA: Diagnosis not present

## 2016-11-16 DIAGNOSIS — R0602 Shortness of breath: Secondary | ICD-10-CM | POA: Diagnosis not present

## 2016-11-23 DIAGNOSIS — I10 Essential (primary) hypertension: Secondary | ICD-10-CM | POA: Diagnosis not present

## 2016-11-23 DIAGNOSIS — Z6832 Body mass index (BMI) 32.0-32.9, adult: Secondary | ICD-10-CM | POA: Diagnosis not present

## 2016-11-23 DIAGNOSIS — R8290 Unspecified abnormal findings in urine: Secondary | ICD-10-CM | POA: Diagnosis not present

## 2016-11-23 DIAGNOSIS — E1149 Type 2 diabetes mellitus with other diabetic neurological complication: Secondary | ICD-10-CM | POA: Diagnosis not present

## 2016-11-23 DIAGNOSIS — R300 Dysuria: Secondary | ICD-10-CM | POA: Diagnosis not present

## 2016-11-23 DIAGNOSIS — R109 Unspecified abdominal pain: Secondary | ICD-10-CM | POA: Diagnosis not present

## 2016-11-23 DIAGNOSIS — N390 Urinary tract infection, site not specified: Secondary | ICD-10-CM | POA: Diagnosis not present

## 2016-11-30 DIAGNOSIS — R0989 Other specified symptoms and signs involving the circulatory and respiratory systems: Secondary | ICD-10-CM | POA: Diagnosis not present

## 2016-11-30 DIAGNOSIS — I951 Orthostatic hypotension: Secondary | ICD-10-CM | POA: Diagnosis not present

## 2016-11-30 DIAGNOSIS — I2510 Atherosclerotic heart disease of native coronary artery without angina pectoris: Secondary | ICD-10-CM | POA: Diagnosis not present

## 2016-11-30 DIAGNOSIS — R55 Syncope and collapse: Secondary | ICD-10-CM | POA: Diagnosis not present

## 2016-12-13 DIAGNOSIS — Z6832 Body mass index (BMI) 32.0-32.9, adult: Secondary | ICD-10-CM | POA: Diagnosis not present

## 2016-12-13 DIAGNOSIS — R300 Dysuria: Secondary | ICD-10-CM | POA: Diagnosis not present

## 2016-12-13 DIAGNOSIS — E1149 Type 2 diabetes mellitus with other diabetic neurological complication: Secondary | ICD-10-CM | POA: Diagnosis not present

## 2016-12-13 DIAGNOSIS — G5603 Carpal tunnel syndrome, bilateral upper limbs: Secondary | ICD-10-CM | POA: Diagnosis not present

## 2016-12-13 DIAGNOSIS — I2510 Atherosclerotic heart disease of native coronary artery without angina pectoris: Secondary | ICD-10-CM | POA: Diagnosis not present

## 2016-12-13 DIAGNOSIS — I119 Hypertensive heart disease without heart failure: Secondary | ICD-10-CM | POA: Diagnosis not present

## 2016-12-13 DIAGNOSIS — R413 Other amnesia: Secondary | ICD-10-CM | POA: Diagnosis not present

## 2016-12-13 DIAGNOSIS — N3281 Overactive bladder: Secondary | ICD-10-CM | POA: Diagnosis not present

## 2016-12-13 DIAGNOSIS — E668 Other obesity: Secondary | ICD-10-CM | POA: Diagnosis not present

## 2017-01-18 ENCOUNTER — Other Ambulatory Visit: Payer: Self-pay | Admitting: Neurology

## 2017-01-27 DIAGNOSIS — H903 Sensorineural hearing loss, bilateral: Secondary | ICD-10-CM | POA: Diagnosis not present

## 2017-02-10 DIAGNOSIS — G5601 Carpal tunnel syndrome, right upper limb: Secondary | ICD-10-CM | POA: Diagnosis not present

## 2017-02-27 DIAGNOSIS — G5601 Carpal tunnel syndrome, right upper limb: Secondary | ICD-10-CM | POA: Diagnosis not present

## 2017-03-05 ENCOUNTER — Other Ambulatory Visit: Payer: Self-pay

## 2017-03-05 ENCOUNTER — Emergency Department (HOSPITAL_COMMUNITY): Payer: Medicare Other

## 2017-03-05 ENCOUNTER — Emergency Department (HOSPITAL_COMMUNITY)
Admission: EM | Admit: 2017-03-05 | Discharge: 2017-03-05 | Disposition: A | Discharge status: Home / Self Care | Payer: Medicare Other | Attending: Emergency Medicine | Admitting: Emergency Medicine

## 2017-03-05 ENCOUNTER — Encounter (HOSPITAL_COMMUNITY): Payer: Self-pay | Admitting: Emergency Medicine

## 2017-03-05 DIAGNOSIS — Z91040 Latex allergy status: Secondary | ICD-10-CM | POA: Diagnosis not present

## 2017-03-05 DIAGNOSIS — Z87891 Personal history of nicotine dependence: Secondary | ICD-10-CM | POA: Diagnosis not present

## 2017-03-05 DIAGNOSIS — Z951 Presence of aortocoronary bypass graft: Secondary | ICD-10-CM | POA: Diagnosis not present

## 2017-03-05 DIAGNOSIS — I2510 Atherosclerotic heart disease of native coronary artery without angina pectoris: Secondary | ICD-10-CM | POA: Diagnosis not present

## 2017-03-05 DIAGNOSIS — Z79899 Other long term (current) drug therapy: Secondary | ICD-10-CM | POA: Diagnosis not present

## 2017-03-05 DIAGNOSIS — Z7982 Long term (current) use of aspirin: Secondary | ICD-10-CM | POA: Diagnosis not present

## 2017-03-05 DIAGNOSIS — I10 Essential (primary) hypertension: Secondary | ICD-10-CM | POA: Diagnosis not present

## 2017-03-05 DIAGNOSIS — R1084 Generalized abdominal pain: Secondary | ICD-10-CM | POA: Diagnosis not present

## 2017-03-05 DIAGNOSIS — E1140 Type 2 diabetes mellitus with diabetic neuropathy, unspecified: Secondary | ICD-10-CM | POA: Diagnosis not present

## 2017-03-05 DIAGNOSIS — R109 Unspecified abdominal pain: Secondary | ICD-10-CM | POA: Insufficient documentation

## 2017-03-05 DIAGNOSIS — R110 Nausea: Secondary | ICD-10-CM | POA: Diagnosis not present

## 2017-03-05 LAB — URINALYSIS, ROUTINE W REFLEX MICROSCOPIC
BACTERIA UA: NONE SEEN
BILIRUBIN URINE: NEGATIVE
Glucose, UA: 150 mg/dL — AB
Ketones, ur: NEGATIVE mg/dL
NITRITE: NEGATIVE
Protein, ur: 100 mg/dL — AB
Specific Gravity, Urine: 1.018 (ref 1.005–1.030)
pH: 6 (ref 5.0–8.0)

## 2017-03-05 LAB — CBC WITH DIFFERENTIAL/PLATELET
BASOS ABS: 0 10*3/uL (ref 0.0–0.1)
BASOS PCT: 0 %
Eosinophils Absolute: 0.2 10*3/uL (ref 0.0–0.7)
Eosinophils Relative: 2 %
HEMATOCRIT: 38.6 % (ref 36.0–46.0)
HEMOGLOBIN: 12.5 g/dL (ref 12.0–15.0)
Lymphocytes Relative: 18 %
Lymphs Abs: 1.9 10*3/uL (ref 0.7–4.0)
MCH: 26.9 pg (ref 26.0–34.0)
MCHC: 32.4 g/dL (ref 30.0–36.0)
MCV: 83.2 fL (ref 78.0–100.0)
Monocytes Absolute: 0.8 10*3/uL (ref 0.1–1.0)
Monocytes Relative: 8 %
NEUTROS ABS: 7.2 10*3/uL (ref 1.7–7.7)
Neutrophils Relative %: 72 %
Platelets: 246 10*3/uL (ref 150–400)
RBC: 4.64 MIL/uL (ref 3.87–5.11)
RDW: 14.4 % (ref 11.5–15.5)
WBC: 10.1 10*3/uL (ref 4.0–10.5)

## 2017-03-05 LAB — BASIC METABOLIC PANEL
ANION GAP: 12 (ref 5–15)
BUN: 16 mg/dL (ref 6–20)
CHLORIDE: 98 mmol/L — AB (ref 101–111)
CO2: 26 mmol/L (ref 22–32)
Calcium: 8.3 mg/dL — ABNORMAL LOW (ref 8.9–10.3)
Creatinine, Ser: 0.84 mg/dL (ref 0.44–1.00)
GFR calc non Af Amer: >60 mL/min (ref >60)
Glucose, Bld: 230 mg/dL — ABNORMAL HIGH (ref 65–99)
Potassium: 3.6 mmol/L (ref 3.5–5.1)
Sodium: 136 mmol/L (ref 135–145)

## 2017-03-05 MED ORDER — TRAMADOL HCL 50 MG PO TABS
50.0000 mg | ORAL_TABLET | Freq: Once | ORAL | Status: AC
  Administered 2017-03-05: 50 mg via ORAL
  Filled 2017-03-05: qty 1

## 2017-03-05 MED ORDER — SODIUM CHLORIDE 0.9 % IV BOLUS (SEPSIS)
500.0000 mL | Freq: Once | INTRAVENOUS | Status: AC
  Administered 2017-03-05: 500 mL via INTRAVENOUS

## 2017-03-05 MED ORDER — FENTANYL CITRATE (PF) 100 MCG/2ML IJ SOLN
50.0000 ug | Freq: Once | INTRAMUSCULAR | Status: AC
  Administered 2017-03-05: 50 ug via INTRAVENOUS
  Filled 2017-03-05: qty 2

## 2017-03-05 MED ORDER — MORPHINE SULFATE (PF) 4 MG/ML IV SOLN
4.0000 mg | Freq: Once | INTRAVENOUS | Status: DC
  Filled 2017-03-05: qty 1

## 2017-03-05 MED ORDER — VALACYCLOVIR HCL 1 G PO TABS: 1000.0000 mg | ORAL_TABLET | Freq: Three times a day (TID) | ORAL | 0 refills | Status: AC

## 2017-03-05 MED ORDER — ONDANSETRON HCL 4 MG/2ML IJ SOLN
4.0000 mg | Freq: Once | INTRAMUSCULAR | Status: AC
  Administered 2017-03-05: 4 mg via INTRAVENOUS
  Filled 2017-03-05: qty 2

## 2017-03-05 MED ORDER — TRAMADOL HCL 50 MG PO TABS: 50.0000 mg | ORAL_TABLET | Freq: Four times a day (QID) | ORAL | 0 refills | Status: DC | PRN

## 2017-03-05 NOTE — ED Triage Notes (Signed)
Pt brought in by EMS from home with c/o intermittent flank pain, onset 2 days ago but with severe exacerbation tonight.  Pt was given Fentanyl 50 mcg IV on scene--- became nauseous after Fentanyl and so Zofran 4 mg IV was also given.

## 2017-03-05 NOTE — ED Notes (Signed)
Bed: WA12 Expected date:  Expected time:  Means of arrival:  Comments: Hall B 

## 2017-03-05 NOTE — ED Provider Notes (Signed)
[facility] DEPT Provider Note   CSN: [id number] Arrival date & time: 03/05/17  0041     History   Chief Complaint Chief Complaint  Patient presents with  . Flank Pain    HPI Linda Oliver is a 84 y.o. female.  HPI  This is an 84 year old female who presents with flank pain.  Patient reports onset of symptoms on Thursday.  She states that it has come and gone.  However tonight it got much worse.  She has had nausea without vomiting.  No hematuria or dysuria.  Denies fevers.  Denies rash.  Denies abdominal pain.  Last normal bowel movement was prior to arrival and described as normal.  Currently she rates her pain at 5 out of 10 after receiving fentanyl by EMS.  Past Medical History:  Diagnosis Date  . Anginal pain ([place])    occ  . Broken ankle   . Bullous pemphigus   . CAD (coronary artery disease)    [place]   80% stenosis ostial Left main, 30% stenosis proximal LAD, 80% stenosis proximal RCA CABG  -  LIMA to LAD, SVG to int, SVG to [place], [place]   . Diabetic peripheral neuropathy ([place])   . GERD (gastroesophageal reflux disease)   . Headache(784.0)    hx migraines  . Heart murmur    mvp  . Hyperlipidemia   . Hypertension   . Hypertensive heart disease   . Nasal bleeding    occ due to O2  . OA (osteoarthritis)   . Obesity (BMI 30-39.9)   . Osteopenia   . Peripheral neuropathy   . Seizure ([place])    2009  . Sleep apnea    dx 77yrs ago doesnt use cpap but uses O2 at nite 2L  . Stones in the urinary tract   . Type 2 diabetes mellitus with vascular disease ([place])   . UTI (lower urinary tract infection)     Patient Active Problem List   Diagnosis Date Noted  . Hereditary and idiopathic peripheral neuropathy 02/19/2014  . MCI (mild cognitive impairment) with memory loss 02/19/2014  . CAD (coronary artery disease)   . Type 2 diabetes mellitus with vascular disease ([place])   . Hyperlipidemia   . Hypertensive heart  disease   . Obesity (BMI 30-39.9)   . GERD (gastroesophageal reflux disease)   . Bullous pemphigus   . Diabetic peripheral neuropathy [facility])     Past Surgical History:  Procedure Laterality Date  . ABDOMINAL HYSTERECTOMY  70's  . CARDIAC CATHETERIZATION    . CHOLECYSTECTOMY    . CORONARY ARTERY BYPASS GRAFT  96  . SHOULDER ARTHROSCOPY     rt  . SHOULDER ARTHROSCOPY WITH ROTATOR CUFF REPAIR AND SUBACROMIAL DECOMPRESSION  12/01/2011   Procedure: SHOULDER ARTHROSCOPY WITH ROTATOR CUFF REPAIR AND SUBACROMIAL DECOMPRESSION;  Surgeon: Nita Sells, MD;  Location: [facility];  Service: Orthopedics;  Laterality: Left;  shoulder debridement calcific tendonitis    OB History    No data available       Home Medications    Prior to Admission medications   Medication Sig Start Date End Date Taking? Authorizing Provider  acetaminophen (TYLENOL) 500 MG tablet Take 500 mg by mouth every 4 (four) hours as needed for moderate pain.   Yes [provider]  alum & mag hydroxide-simeth (MAALOX/MYLANTA) 200-200-20 MG/5ML suspension Take 30 mLs by mouth every 6 (six) hours as needed for indigestion or heartburn.  Yes [provider]  aspirin EC 81 MG tablet Take 81 mg by mouth daily.   Yes [provider]  atorvastatin (LIPITOR) 40 MG tablet Take 40 mg by mouth daily.   Yes [provider]  baclofen (LIORESAL) 10 MG tablet Take 10 mg by mouth 3 (three) times daily.   Yes [provider]  buPROPion (WELLBUTRIN SR) 100 MG 12 hr tablet Take 100 mg by mouth 2 (two) times daily.   Yes [provider]  calcium-vitamin D (OSCAL WITH D) 500-200 MG-UNIT tablet Take 1 tablet by mouth daily with breakfast.   Yes [provider]  citalopram (CELEXA) 20 MG tablet Take 20 mg by mouth daily.   Yes [provider]  gabapentin (NEURONTIN) 100 MG capsule Take 100 mg by mouth 3 (three) times daily.   Yes [provider]    hydrochlorothiazide (HYDRODIURIL) 25 MG tablet Take 25 mg by mouth daily.   Yes [provider]  loratadine (CLARITIN) 10 MG tablet Take 10 mg by mouth daily.   Yes [provider]  Multiple Vitamin (MULTIVITAMIN WITH MINERALS) TABS tablet Take 1 tablet by mouth daily.   Yes [provider]  nystatin (NYSTATIN) powder Apply 1 g topically daily.   Yes [provider]  nystatin-triamcinolone (MYCOLOG II) cream Apply 1 application topically 2 (two) times daily.   Yes [provider]  polyethylene glycol (MIRALAX / GLYCOLAX) packet Take 17 g by mouth daily.   Yes [provider]  potassium chloride 20 MEQ/15ML (10%) SOLN Take 15 mEq by mouth daily.   Yes [provider]  sucralfate (CARAFATE) 1 GM/10ML suspension Take 1 g by mouth 4 (four) times daily.   Yes [provider]  NAMZARIC 28-10 MG CP24 TAKE 1 CAPSULE DAILY 01/20/17   Melvenia Beam, MD  traMADol (ULTRAM) 50 MG tablet Take 1 tablet (50 mg total) by mouth every 6 (six) hours as needed. 03/05/17   Quinne Pires, Barbette Hair, MD  valACYclovir (VALTREX) 1000 MG tablet Take 1 tablet (1,000 mg total) by mouth 3 (three) times daily for 14 days. 03/05/17 03/19/17  Merryl Hacker, MD    Family History Family History  Problem Relation Age of Onset  . Coronary artery disease Mother   . Heart disease Father   . Cancer Father        skin  . Cancer Brother   . Cancer - Colon Brother        brain, Lymphoma    Social History Social History   Tobacco Use  . Smoking status: Former Smoker    Packs/day: 1.00    Years: 30.00    Pack years: 30.00    Types: Cigarettes    Last attempt to quit: 11/25/1986    Years since quitting: 30.2  . Smokeless tobacco: Never Used  Substance Use Topics  . Alcohol use: No  . Drug use: No     Allergies   Percocet [oxycodone-acetaminophen] and Latex   Review of Systems Review of Systems  Constitutional: Negative for fever.   Respiratory: Negative for shortness of breath.   Cardiovascular: Negative for chest pain.  Gastrointestinal: Positive for nausea. Negative for abdominal pain, constipation, diarrhea and vomiting.  Genitourinary: Positive for flank pain. Negative for dysuria and hematuria.  Musculoskeletal: Negative for back pain.  Skin: Negative for rash.  All other systems reviewed and are negative.    Physical Exam Updated Vital Signs BP 113/82   Pulse 67   Temp 98  F (36.7 C) (Oral)   Resp 18   SpO2 97%   Physical Exam  Constitutional: She is oriented to person, place, and time. She appears well-developed and well-nourished.  Elderly, no acute distress  HENT:  Head: Normocephalic and atraumatic.  Cardiovascular: Normal rate, regular rhythm and normal heart sounds.  Pulmonary/Chest: Effort normal and breath sounds normal. No respiratory distress. She has no wheezes.  Abdominal: Soft. Bowel sounds are normal. There is no tenderness. There is no guarding.  Genitourinary:  Genitourinary Comments: No CVA tenderness  Musculoskeletal:  Tenderness to palpation with light palpation over the right flank and lower back, no crepitus  Neurological: She is alert and oriented to person, place, and time.  Skin: Skin is warm and dry.  No rash  Psychiatric: She has a normal mood and affect.  Nursing note and vitals reviewed.    ED Treatments / Results  Labs (all labs ordered are listed, but only abnormal results are displayed) Labs Reviewed  BASIC METABOLIC PANEL - Abnormal; Notable for the following components:      Result Value   Chloride 98 (*)    Glucose, Bld 230 (*)    Calcium 8.3 (*)    All other components within normal limits  URINALYSIS, ROUTINE W REFLEX MICROSCOPIC - Abnormal; Notable for the following components:   Glucose, UA 150 (*)    Hgb urine dipstick SMALL (*)    Protein, ur 100 (*)    Leukocytes, UA SMALL (*)    Squamous Epithelial / LPF 0-5 (*)    All other components  within normal limits  CBC WITH DIFFERENTIAL/PLATELET    EKG  EKG Interpretation None       Radiology Ct Renal Stone Study  Result Date: 03/05/2017 CLINICAL DATA:  Intermittent flank pain for 2 days, severe tonight. History of diabetes and urinary tract infection. EXAM: CT ABDOMEN AND PELVIS WITHOUT CONTRAST TECHNIQUE: Multidetector CT imaging of the abdomen and pelvis was performed following the standard protocol without IV contrast. COMPARISON:  None. FINDINGS: LOWER CHEST: Lung bases are clear. The visualized heart size is normal. Mild coronary artery calcifications. No pericardial effusion. HEPATOBILIARY: Status post cholecystectomy.  Normal liver. PANCREAS: Normal. SPLEEN: Normal. ADRENALS/URINARY TRACT: Kidneys are orthotopic, demonstrating normal size and morphology. No nephrolithiasis, hydronephrosis; limited assessment for renal masses on this nonenhanced examination. LEFT vascular calcification. The unopacified ureters are normal in course and caliber. Urinary bladder is partially distended and unremarkable. Normal adrenal glands. STOMACH/BOWEL: The stomach, small and large bowel are normal in course and caliber without inflammatory changes, sensitivity decreased by lack of enteric contrast. Superiorly directed moderate duodenal diverticulum. Moderate colonic diverticulosis. Moderate amount of retained large bowel stool. Normal appendix. VASCULAR/LYMPHATIC: Aortoiliac vessels are normal in course and caliber. Severe calcific atherosclerosis and chronic infrarenal aortic dissection. No lymphadenopathy by CT size criteria. REPRODUCTIVE: Status post hysterectomy. OTHER: No intraperitoneal free fluid or free air. Dropped clip anterior abdomen. MUSCULOSKELETAL: Non-acute. Moderate to severe L5-S1 degenerative disc. No osseous canal stenosis. Mild RIGHT L5-S1 neural foraminal narrowing. IMPRESSION: 1. No nephrolithiasis, hydronephrosis or acute intra-abdominopelvic process. 2. Moderate amount of  retained large bowel stool without bowel obstruction. Moderate colonic diverticulosis. Electronically Signed   By: Elon Alas M.D.   On: 03/05/2017 02:42    Procedures Procedures (including critical care time)  Medications Ordered in ED Medications  ondansetron (ZOFRAN) injection 4 mg (4 mg Intravenous Given 03/05/17 0116)  sodium chloride 0.9 % bolus 500 mL (0 mLs Intravenous Stopped 03/05/17 0357)  fentaNYL (SUBLIMAZE)  injection 50 mcg (50 mcg Intravenous Given 03/05/17 0123)  traMADol (ULTRAM) tablet 50 mg (50 mg Oral Given 03/05/17 0359)     Initial Impression / Assessment and Plan / ED Course  I have reviewed the triage vital signs and the nursing notes.  Pertinent labs & imaging results that were available during my care of the patient were reviewed by me and considered in my medical decision making (see chart for details).     Patient presents with right flank pain.  She is overall nontoxic appearing and vital signs are reassuring.  She is afebrile.  She has tenderness over the right flank.  No abdominal pain.  Minimal other symptoms.  Sounds somewhat suspicious for kidney stone.  Could be pyelonephritis; however, she is with out any urinary symptoms.  Another consideration would be a prodrome to shingles.  Lab work obtained.  CT stone study obtained.  No evidence of kidney stone.  She does have some stool burden.  However, clinically she denies any significant constipation.  On recheck, she states she has had some improvement of pain but remains tender out of proportion to physical exam findings.  Unclear etiology.  Discussed with patient supportive measures including pain control.  Will write a prescription for oral valacyclovir if she develops a rash.  She needs to follow-up with her primary physician.  After history, exam, and medical workup I feel the patient has been appropriately medically screened and is safe for discharge home. Pertinent diagnoses were discussed with the  patient. Patient was given return precautions.   Final Clinical Impressions(s) / ED Diagnoses   Final diagnoses:  Flank pain    ED Discharge Orders        Ordered    traMADol (ULTRAM) 50 MG tablet  Every 6 hours PRN     03/05/17 0439    valACYclovir (VALTREX) 1000 MG tablet  3 times daily     03/05/17 0439       Mell Mellott, Barbette Hair, MD 03/05/17 [phone]

## 2017-03-05 NOTE — Discharge Instructions (Signed)
You were seen today for right flank pain.  The cause is not exactly known.  It does not appear you have a urinary tract infection.  You do have evidence of some constipation on your CT scan.  This could be pain related to shingles that has not yet developed.  Monitor closely.  Take tramadol for pain.  He will be given a prescription for valacyclovir.  If you develop a rash, initiate treatment and follow-up with your primary physician.

## 2017-03-05 NOTE — ED Notes (Signed)
Bed: [facility] Expected date:  Expected time:  Means of arrival:  Comments: EMS 

## 2017-03-06 DIAGNOSIS — I700 Atherosclerosis of aorta: Secondary | ICD-10-CM | POA: Diagnosis not present

## 2017-03-06 DIAGNOSIS — G4733 Obstructive sleep apnea (adult) (pediatric): Secondary | ICD-10-CM | POA: Diagnosis not present

## 2017-03-06 DIAGNOSIS — R1084 Generalized abdominal pain: Secondary | ICD-10-CM | POA: Diagnosis not present

## 2017-03-06 DIAGNOSIS — Z6832 Body mass index (BMI) 32.0-32.9, adult: Secondary | ICD-10-CM | POA: Diagnosis not present

## 2017-03-27 DIAGNOSIS — G4733 Obstructive sleep apnea (adult) (pediatric): Secondary | ICD-10-CM | POA: Diagnosis not present

## 2017-03-27 DIAGNOSIS — I119 Hypertensive heart disease without heart failure: Secondary | ICD-10-CM | POA: Diagnosis not present

## 2017-03-27 DIAGNOSIS — H43811 Vitreous degeneration, right eye: Secondary | ICD-10-CM | POA: Diagnosis not present

## 2017-03-27 DIAGNOSIS — G5600 Carpal tunnel syndrome, unspecified upper limb: Secondary | ICD-10-CM | POA: Diagnosis not present

## 2017-03-27 DIAGNOSIS — R109 Unspecified abdominal pain: Secondary | ICD-10-CM | POA: Diagnosis not present

## 2017-03-27 DIAGNOSIS — R413 Other amnesia: Secondary | ICD-10-CM | POA: Diagnosis not present

## 2017-03-27 DIAGNOSIS — I700 Atherosclerosis of aorta: Secondary | ICD-10-CM | POA: Diagnosis not present

## 2017-03-27 DIAGNOSIS — Z794 Long term (current) use of insulin: Secondary | ICD-10-CM | POA: Diagnosis not present

## 2017-03-27 DIAGNOSIS — Z6832 Body mass index (BMI) 32.0-32.9, adult: Secondary | ICD-10-CM | POA: Diagnosis not present

## 2017-03-27 DIAGNOSIS — E119 Type 2 diabetes mellitus without complications: Secondary | ICD-10-CM | POA: Diagnosis not present

## 2017-03-27 DIAGNOSIS — I2721 Secondary pulmonary arterial hypertension: Secondary | ICD-10-CM | POA: Diagnosis not present

## 2017-03-27 DIAGNOSIS — H35071 Retinal telangiectasis, right eye: Secondary | ICD-10-CM | POA: Diagnosis not present

## 2017-03-27 DIAGNOSIS — H35371 Puckering of macula, right eye: Secondary | ICD-10-CM | POA: Diagnosis not present

## 2017-03-27 DIAGNOSIS — H35342 Macular cyst, hole, or pseudohole, left eye: Secondary | ICD-10-CM | POA: Diagnosis not present

## 2017-03-27 DIAGNOSIS — D692 Other nonthrombocytopenic purpura: Secondary | ICD-10-CM | POA: Diagnosis not present

## 2017-03-27 DIAGNOSIS — E669 Obesity, unspecified: Secondary | ICD-10-CM | POA: Diagnosis not present

## 2017-03-27 DIAGNOSIS — E1149 Type 2 diabetes mellitus with other diabetic neurological complication: Secondary | ICD-10-CM | POA: Diagnosis not present

## 2017-03-31 DIAGNOSIS — G5602 Carpal tunnel syndrome, left upper limb: Secondary | ICD-10-CM | POA: Diagnosis not present

## 2017-04-13 DIAGNOSIS — G5602 Carpal tunnel syndrome, left upper limb: Secondary | ICD-10-CM | POA: Diagnosis not present

## 2017-04-25 DIAGNOSIS — Z961 Presence of intraocular lens: Secondary | ICD-10-CM | POA: Diagnosis not present

## 2017-04-25 DIAGNOSIS — H35373 Puckering of macula, bilateral: Secondary | ICD-10-CM | POA: Diagnosis not present

## 2017-05-08 ENCOUNTER — Ambulatory Visit (INDEPENDENT_AMBULATORY_CARE_PROVIDER_SITE_OTHER): Payer: Medicare Other | Admitting: Neurology

## 2017-05-08 ENCOUNTER — Encounter: Payer: Self-pay | Admitting: Neurology

## 2017-05-08 VITALS — BP 135/58 | HR 65 | Ht 60.0 in | Wt 174.0 lb

## 2017-05-08 DIAGNOSIS — R5383 Other fatigue: Secondary | ICD-10-CM

## 2017-05-08 DIAGNOSIS — G4730 Sleep apnea, unspecified: Secondary | ICD-10-CM

## 2017-05-08 DIAGNOSIS — G4710 Hypersomnia, unspecified: Secondary | ICD-10-CM | POA: Diagnosis not present

## 2017-05-08 DIAGNOSIS — G4733 Obstructive sleep apnea (adult) (pediatric): Secondary | ICD-10-CM

## 2017-05-08 DIAGNOSIS — Z9981 Dependence on supplemental oxygen: Secondary | ICD-10-CM

## 2017-05-08 NOTE — Progress Notes (Signed)
SLEEP MEDICINE CLINIC   Provider:  Larey Seat, M D  Primary Care Physician:  Shon Baton, MD   Referring Provider: Shon Baton, MD    Chief Complaint  Patient presents with  . New Patient (Initial Visit)    pt alone, rm 10. pt states that she had a sleep study completed in 2010. CPAP started after the test. she states that it broke and she has not used it for a couple of years    HPI:  Linda Oliver is a 84 y.o. female , seen here as in a referral  from Dr. Virgina Jock for a sleep consultation.  I had the pleasure of seeing Linda Oliver in 2010 when she underwent a polysomnography on 1 November of that year also referred by Dr. Manuella Ghazi.  At the time she was 85 years old with a history of diabetes, GERD, hypertension, neuropathy, coronary artery disease, pernicious anemia and had reported excessive daytime sleepiness and fatigue as well as observed apneas and snoring.  She had a triple bypass in the 1990s, cholecystectomy hysterectomy hand surgery for carpal tunnel, shoulder surgery on both sides the last hand surgery was in February 2019.  The sleep study revealed an AHI of 10.6 only 8 clear apneas were noted and 66 hypopneas, there was no REM sleep seen, the patient was placed on CPAP at the time and there was later oxygen ordered as well.  She was set up with [facility] but she states that she could not get service from [place] over the last 2 years and then stopped using the machine.  The lowest oxygen saturation was 85% SPO2 was 25.7 minutes of total desaturation time, arousal index was 20.1.  The patient reports that her sleep is still very fragmented and that since she is not using CPAP or oxygen now she does feel a little bit more sleepy and fatigued again.  She would like to return to treatment.   The patient is established with Dr. Georgia Dom, see below quotation of last visit with her.  MRI brain 09/27/2016: nothing acute, remote small infarct superior left cerebellar hemisphere, moderate  cerebral atrophy and mild cerebellar atrophy. Interval history 11/08/2016: Feels her memory is stable.Discussed MCI, formal neurocognitive testing. Discussed clinical trials including [employer]. She is already on Aricept and Namenda. Feels her memory is stable. Here with her son-in-law who also provides information. Provided information all of the above. I also reviewed MRI of the brain and cervical spine. 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 She had some degenerative changes in the neck but no myelopathy or cervical stenosis. EMG nerve conduction study showed peripheral neuropathy, bilateral carpal tunnel syndrome but no etiology for her proximal weakness such as myopathy myositis or neuromuscular disorder.   Chief complaint according to patient :   Sleep habits are as follows: watches TV in the den, checks BP and takes her medication,    Her bed time is at 11 Pm, wakes up at 3 , 6 Am and rises at 8 AM. Sleep on either side, on multiple pillows. Adjustable bed user. One nocturia at 6 AM, the other time spontaneous arousals. The bedroom is cool, quiet and dark. Sometimes she watches Tv in bed, 1 day a week on averagae. She averages 6 hours or less of sleep. She does feel refreshed after waking up but only temporarily. By lunch  time she is exhausted, falls easily asleep. She may nap for 20 minutes. She reports vivid dreams. She talks in her sleep. Her husband is in a nursing home- she speaks with him in her sleep. Her husband has a bad heart, lungs and urostomy.    Sleep medical history and family sleep history:  MCI , see above listed medical condition.   Social history: married for 65 years , cannot drive anymore, non smoker, non drinker, caffeine in form of tea and coke.  1 daughter, 2 grandchildren.   Review of Systems: Out of a complete 14 system  review, the patient complains of only the following symptoms, and all other reviewed systems are negative.  The patient endorsed fatigue, history of a detached retina was blurred vision easy bruising increased thirst, snoring joint pain incontinence allergies runny nose and skin sensitivity hearing loss anxiety depression decreased energy-interest in some activities some memory loss and insomnia as well as restless legs.  She endorsed the Epworth Sleepiness Scale 9/ 24 points, and the fatigue severity score of 59 points much higher.  The geriatric depression score was endorsed positive at 7 out of 15 points which is indicative for the presence of a mild depression.   Social History   Socioeconomic History  . Marital status: Married    Spouse name: Not on file  . Number of children: 1  . Years of education: 63  . Highest education level: Not on file  Occupational History  . Not on file  Social Needs  . Financial resource strain: Not on file  . Food insecurity:    Worry: Not on file    Inability: Not on file  . Transportation needs:    Medical: Not on file    Non-medical: Not on file  Tobacco Use  . Smoking status: Former Smoker    Packs/day: 1.00    Years: 30.00    Pack years: 30.00    Types: Cigarettes    Last attempt to quit: 11/25/1986    Years since quitting: 30.4  . Smokeless tobacco: Never Used  Substance and Sexual Activity  . Alcohol use: No  . Drug use: No  . Sexual activity: Yes    Birth control/protection: Post-menopausal  Lifestyle  . Physical activity:    Days per week: Not on file    Minutes per session: Not on file  . Stress: Not on file  Relationships  . Social connections:    Talks on phone: Not on file    Gets together: Not on file    Attends religious service: Not on file    Active member of club or organization: Not on file    Attends meetings of clubs or organizations: Not on file    Relationship status: Not on file  . Intimate partner violence:      Fear of current or ex partner: Not on file    Emotionally abused: Not on file    Physically abused: Not on file    Forced sexual activity: Not on file  Other Topics Concern  . Not on file  Social History Narrative   Married, has 1 child   Caffeine use: none    Family History  Problem Relation Age of Onset  . Coronary artery disease Mother   . Heart disease Father   . Cancer Father        skin  . Cancer Brother   . Cancer - Colon Brother        brain, Lymphoma  Past Medical History:  Diagnosis Date  . Anginal pain ([place])    occ  . Anxiety   . Broken ankle   . Bullous pemphigus   . CAD (coronary artery disease)    [place]   80% stenosis ostial Left main, 30% stenosis proximal LAD, 80% stenosis proximal RCA CABG  -  LIMA to LAD, SVG to int, SVG to [place], [place]   . Depression   . Diabetic peripheral neuropathy ([place])   . GERD (gastroesophageal reflux disease)   . Headache(784.0)    hx migraines  . Heart murmur    mvp  . Hyperlipidemia   . Hypertension   . Hypertensive heart disease   . Nasal bleeding    occ due to O2  . OA (osteoarthritis)   . Obesity (BMI 30-39.9)   . Osteopenia   . Peripheral neuropathy   . Seizure ([place])    2009  . Sleep apnea    dx 65yrs ago doesnt use cpap but uses O2 at nite 2L  . Stones in the urinary tract   . Type 2 diabetes mellitus with vascular disease ([place])   . UTI (lower urinary tract infection)     Past Surgical History:  Procedure Laterality Date  . ABDOMINAL HYSTERECTOMY  70's  . CARDIAC CATHETERIZATION    . CARPAL TUNNEL RELEASE Bilateral   . CHOLECYSTECTOMY    . CORONARY ARTERY BYPASS GRAFT  96  . SHOULDER ARTHROSCOPY     rt  . SHOULDER ARTHROSCOPY WITH ROTATOR CUFF REPAIR AND SUBACROMIAL DECOMPRESSION  12/01/2011   Procedure: SHOULDER ARTHROSCOPY WITH ROTATOR CUFF REPAIR AND SUBACROMIAL DECOMPRESSION;  Surgeon: Nita Sells, MD;  Location: [facility];  Service: Orthopedics;   Laterality: Left;  shoulder debridement calcific tendonitis    Current Outpatient Medications  Medication Sig Dispense Refill  . acetaminophen (TYLENOL) 500 MG tablet Take 500 mg by mouth every 4 (four) hours as needed for moderate pain.    Marland Kitchen alum & mag hydroxide-simeth (MAALOX/MYLANTA) 200-200-20 MG/5ML suspension Take 30 mLs by mouth every 6 (six) hours as needed for indigestion or heartburn.    Marland Kitchen aspirin EC 81 MG tablet Take 81 mg by mouth daily.    Marland Kitchen atorvastatin (LIPITOR) 40 MG tablet Take 40 mg by mouth daily.    . baclofen (LIORESAL) 10 MG tablet Take 10 mg by mouth 3 (three) times daily.    Marland Kitchen buPROPion (WELLBUTRIN SR) 100 MG 12 hr tablet Take 100 mg by mouth 2 (two) times daily.    . calcium-vitamin D (OSCAL WITH D) 500-200 MG-UNIT tablet Take 1 tablet by mouth daily with breakfast.    . citalopram (CELEXA) 20 MG tablet Take 20 mg by mouth daily.    Marland Kitchen gabapentin (NEURONTIN) 100 MG capsule Take 100 mg by mouth 3 (three) times daily.    . hydrochlorothiazide (HYDRODIURIL) 25 MG tablet Take 25 mg by mouth daily.    Marland Kitchen loratadine (CLARITIN) 10 MG tablet Take 10 mg by mouth daily.    . Multiple Vitamin (MULTIVITAMIN WITH MINERALS) TABS tablet Take 1 tablet by mouth daily.    Marland Kitchen NAMZARIC 28-10 MG CP24 TAKE 1 CAPSULE DAILY 90 capsule 3  . nystatin (NYSTATIN) powder Apply 1 g topically daily.    Marland Kitchen nystatin-triamcinolone (MYCOLOG II) cream Apply 1 application topically 2 (two) times daily.    . polyethylene glycol (MIRALAX / GLYCOLAX) packet Take 17 g by mouth daily.    . potassium chloride 20 MEQ/15ML (10%)  SOLN Take 15 mEq by mouth daily.    . sucralfate (CARAFATE) 1 GM/10ML suspension Take 1 g by mouth 4 (four) times daily.    . traMADol (ULTRAM) 50 MG tablet Take 1 tablet (50 mg total) by mouth every 6 (six) hours as needed. 15 tablet 0   No current facility-administered medications for this visit.     Allergies as of 05/08/2017 - Review Complete 05/08/2017  Allergen Reaction Noted  .  Percocet [oxycodone-acetaminophen] Nausea And Vomiting 09/30/2011  . Latex Itching and Rash 11/22/2011    Vitals: BP (!) 135/58   Pulse 65   Ht 5' (1.524 m)   Wt 174 lb (78.9 kg)   BMI 33.98 kg/m  Last Weight:  Wt Readings from Last 1 Encounters:  05/08/17 174 lb (78.9 kg)   OZH:YQMV mass index is 33.98 kg/m.     Last Height:   Ht Readings from Last 1 Encounters:  05/08/17 5' (1.524 m)    Physical exam:  General: The patient is awake, alert and appears not in acute distress. The patient is well groomed. Head: Normocephalic, atraumatic. Neck is supple. Mallampati 2,  neck circumference:14. Nasal airflow patent , TMJ clicking  is evident on the left . Retrognathia is not seen. Partial dentures.  Cardiovascular:  Regular rate and rhythm , without  murmurs or carotid bruit, and without distended neck veins. Respiratory: Lungs are clear to auscultation. Skin:  Without evidence of edema, or rash Trunk: BMI is 34. The patient's posture is erect    Neurologic exam : The patient is awake and alert, oriented to place and time.   Memory was not tested today.   MOCA: Montreal Cognitive Assessment  11/04/2015 02/19/2014  Visuospatial/ Executive (0/5) 4 3  Naming (0/3) 3 3  Attention: Read list of digits (0/2) 2 2  Attention: Read list of letters (0/1) 1 1  Attention: Serial 7 subtraction starting at 100 (0/3) 0 2  Language: Repeat phrase (0/2) 1 1  Language : Fluency (0/1) 0 0  Abstraction (0/2) 1 2  Delayed Recall (0/5) 2 4  Orientation (0/6) 6 6  Total 20 24  Adjusted Score (based on education) 21 25   MMSE: MMSE - Mini Mental State Exam 11/08/2016  Orientation to time 5  Orientation to Place 5  Registration 3  Attention/ Calculation 5  Recall 2  Language- name 2 objects 2  Language- repeat 1  Language- follow 3 step command 3  Language- read & follow direction 1  Write a sentence 1  Copy design 1  Total score 29       Attention span & concentration ability  appears normal.  Speech is fluent,   Without  dysarthria, dysphonia or aphasia.  Mood and affect are appropriate.  Cranial nerves: Pupils are equal and briskly reactive to light. Funduscopic exam without  evidence of pallor or edema. Extraocular movements  in vertical and horizontal planes intact and without nystagmus. Visual fields by finger perimetry are intact. Hearing to finger rub intact.  Facial sensation intact to fine touch. Facial motor strength is symmetric and tongue and uvula move midline. Shoulder shrug was symmetrical.   Motor exam:  Normal tone, muscle bulk and symmetric strength in all extremities. Sensory:  Fine touch, pinprick and vibration were tested in all extremities. Proprioception tested in the upper extremities was normal. Coordination: Rapid alternating movements in the fingers/hands was normal. Finger-to-nose maneuver  normal without evidence of ataxia, dysmetria or tremor. Gait and station: Patient walks with  a cane as  assistive device  Turns with 4  Steps.  She reports frequent falls.  Deep tendon reflexes: in the  upper and lower extremities are symmetric and intact. Babinski maneuver response is downgoing.  Assessment:  After physical and neurologic examination, review of laboratory studies,  Personal review of imaging studies, reports of other /same  Imaging studies, results of polysomnography and / or neurophysiology testing and pre-existing records as far as provided in visit., my assessment is   1) OSA may still be present - given the report of the patients increased fatigue since she has been of CPAP and oxygen.   2) she insists on a HST , and reportedly has insomnia in strange environments. Her house feels safe and she has her dogs there, too.   3) Visit after HST.    The patient was advised of the nature of the diagnosed disorder , the treatment options and the  risks for general health and wellness arising from not treating the condition.   I spent  more than 35 minutes of face to face time with the patient.  Greater than 50% of time was spent in counseling and coordination of care. We have discussed the diagnosis and differential and I answered the patient's questions.    Plan:  Treatment plan and additional workup : HST    Linda Geyer, MD 0/03/7046, 8:89 PM  Certified in Neurology by ABPN Certified in [place] by [facility] [address], [place], [place] [address]

## 2017-05-08 NOTE — Patient Instructions (Signed)

## 2017-05-31 ENCOUNTER — Encounter: Payer: Medicare Other | Admitting: Neurology

## 2017-06-01 DIAGNOSIS — G5601 Carpal tunnel syndrome, right upper limb: Secondary | ICD-10-CM | POA: Diagnosis not present

## 2017-06-01 DIAGNOSIS — G5602 Carpal tunnel syndrome, left upper limb: Secondary | ICD-10-CM | POA: Diagnosis not present

## 2017-06-12 DIAGNOSIS — I2510 Atherosclerotic heart disease of native coronary artery without angina pectoris: Secondary | ICD-10-CM | POA: Diagnosis not present

## 2017-06-12 DIAGNOSIS — I10 Essential (primary) hypertension: Secondary | ICD-10-CM | POA: Diagnosis not present

## 2017-06-12 DIAGNOSIS — I951 Orthostatic hypotension: Secondary | ICD-10-CM | POA: Diagnosis not present

## 2017-06-12 DIAGNOSIS — R55 Syncope and collapse: Secondary | ICD-10-CM | POA: Diagnosis not present

## 2017-06-21 DIAGNOSIS — R5381 Other malaise: Secondary | ICD-10-CM | POA: Diagnosis not present

## 2017-06-21 DIAGNOSIS — I10 Essential (primary) hypertension: Secondary | ICD-10-CM | POA: Diagnosis not present

## 2017-06-21 DIAGNOSIS — R42 Dizziness and giddiness: Secondary | ICD-10-CM | POA: Diagnosis not present

## 2017-06-21 DIAGNOSIS — G4733 Obstructive sleep apnea (adult) (pediatric): Secondary | ICD-10-CM | POA: Diagnosis not present

## 2017-06-21 DIAGNOSIS — I951 Orthostatic hypotension: Secondary | ICD-10-CM | POA: Diagnosis not present

## 2017-06-21 DIAGNOSIS — Z6832 Body mass index (BMI) 32.0-32.9, adult: Secondary | ICD-10-CM | POA: Diagnosis not present

## 2017-06-21 DIAGNOSIS — I2510 Atherosclerotic heart disease of native coronary artery without angina pectoris: Secondary | ICD-10-CM | POA: Diagnosis not present

## 2017-06-21 DIAGNOSIS — E538 Deficiency of other specified B group vitamins: Secondary | ICD-10-CM | POA: Diagnosis not present

## 2017-06-21 DIAGNOSIS — G6289 Other specified polyneuropathies: Secondary | ICD-10-CM | POA: Diagnosis not present

## 2017-06-21 DIAGNOSIS — R5383 Other fatigue: Secondary | ICD-10-CM | POA: Diagnosis not present

## 2017-06-21 DIAGNOSIS — M6281 Muscle weakness (generalized): Secondary | ICD-10-CM | POA: Diagnosis not present

## 2017-06-27 DIAGNOSIS — R296 Repeated falls: Secondary | ICD-10-CM | POA: Diagnosis not present

## 2017-06-27 DIAGNOSIS — I272 Pulmonary hypertension, unspecified: Secondary | ICD-10-CM | POA: Diagnosis not present

## 2017-06-27 DIAGNOSIS — E1142 Type 2 diabetes mellitus with diabetic polyneuropathy: Secondary | ICD-10-CM | POA: Diagnosis not present

## 2017-06-27 DIAGNOSIS — M6281 Muscle weakness (generalized): Secondary | ICD-10-CM | POA: Diagnosis not present

## 2017-06-27 DIAGNOSIS — I951 Orthostatic hypotension: Secondary | ICD-10-CM | POA: Diagnosis not present

## 2017-06-27 DIAGNOSIS — Z9181 History of falling: Secondary | ICD-10-CM | POA: Diagnosis not present

## 2017-06-27 DIAGNOSIS — I2510 Atherosclerotic heart disease of native coronary artery without angina pectoris: Secondary | ICD-10-CM | POA: Diagnosis not present

## 2017-06-27 DIAGNOSIS — I2789 Other specified pulmonary heart diseases: Secondary | ICD-10-CM | POA: Diagnosis not present

## 2017-06-27 DIAGNOSIS — M1991 Primary osteoarthritis, unspecified site: Secondary | ICD-10-CM | POA: Diagnosis not present

## 2017-06-28 DIAGNOSIS — I272 Pulmonary hypertension, unspecified: Secondary | ICD-10-CM | POA: Diagnosis not present

## 2017-06-28 DIAGNOSIS — R296 Repeated falls: Secondary | ICD-10-CM | POA: Diagnosis not present

## 2017-06-28 DIAGNOSIS — I951 Orthostatic hypotension: Secondary | ICD-10-CM | POA: Diagnosis not present

## 2017-06-28 DIAGNOSIS — I2510 Atherosclerotic heart disease of native coronary artery without angina pectoris: Secondary | ICD-10-CM | POA: Diagnosis not present

## 2017-06-28 DIAGNOSIS — E1142 Type 2 diabetes mellitus with diabetic polyneuropathy: Secondary | ICD-10-CM | POA: Diagnosis not present

## 2017-06-28 DIAGNOSIS — M6281 Muscle weakness (generalized): Secondary | ICD-10-CM | POA: Diagnosis not present

## 2017-06-29 DIAGNOSIS — I272 Pulmonary hypertension, unspecified: Secondary | ICD-10-CM | POA: Diagnosis not present

## 2017-06-29 DIAGNOSIS — I2510 Atherosclerotic heart disease of native coronary artery without angina pectoris: Secondary | ICD-10-CM | POA: Diagnosis not present

## 2017-06-29 DIAGNOSIS — E1142 Type 2 diabetes mellitus with diabetic polyneuropathy: Secondary | ICD-10-CM | POA: Diagnosis not present

## 2017-06-29 DIAGNOSIS — R296 Repeated falls: Secondary | ICD-10-CM | POA: Diagnosis not present

## 2017-06-29 DIAGNOSIS — M6281 Muscle weakness (generalized): Secondary | ICD-10-CM | POA: Diagnosis not present

## 2017-06-29 DIAGNOSIS — I951 Orthostatic hypotension: Secondary | ICD-10-CM | POA: Diagnosis not present

## 2017-06-30 DIAGNOSIS — E1142 Type 2 diabetes mellitus with diabetic polyneuropathy: Secondary | ICD-10-CM | POA: Diagnosis not present

## 2017-06-30 DIAGNOSIS — R296 Repeated falls: Secondary | ICD-10-CM | POA: Diagnosis not present

## 2017-06-30 DIAGNOSIS — I272 Pulmonary hypertension, unspecified: Secondary | ICD-10-CM | POA: Diagnosis not present

## 2017-06-30 DIAGNOSIS — I2510 Atherosclerotic heart disease of native coronary artery without angina pectoris: Secondary | ICD-10-CM | POA: Diagnosis not present

## 2017-06-30 DIAGNOSIS — I951 Orthostatic hypotension: Secondary | ICD-10-CM | POA: Diagnosis not present

## 2017-06-30 DIAGNOSIS — M6281 Muscle weakness (generalized): Secondary | ICD-10-CM | POA: Diagnosis not present

## 2017-07-03 DIAGNOSIS — R296 Repeated falls: Secondary | ICD-10-CM | POA: Diagnosis not present

## 2017-07-03 DIAGNOSIS — E1142 Type 2 diabetes mellitus with diabetic polyneuropathy: Secondary | ICD-10-CM | POA: Diagnosis not present

## 2017-07-03 DIAGNOSIS — I272 Pulmonary hypertension, unspecified: Secondary | ICD-10-CM | POA: Diagnosis not present

## 2017-07-03 DIAGNOSIS — M6281 Muscle weakness (generalized): Secondary | ICD-10-CM | POA: Diagnosis not present

## 2017-07-03 DIAGNOSIS — I951 Orthostatic hypotension: Secondary | ICD-10-CM | POA: Diagnosis not present

## 2017-07-03 DIAGNOSIS — I2510 Atherosclerotic heart disease of native coronary artery without angina pectoris: Secondary | ICD-10-CM | POA: Diagnosis not present

## 2017-07-04 DIAGNOSIS — M6281 Muscle weakness (generalized): Secondary | ICD-10-CM | POA: Diagnosis not present

## 2017-07-04 DIAGNOSIS — R296 Repeated falls: Secondary | ICD-10-CM | POA: Diagnosis not present

## 2017-07-04 DIAGNOSIS — I272 Pulmonary hypertension, unspecified: Secondary | ICD-10-CM | POA: Diagnosis not present

## 2017-07-04 DIAGNOSIS — I951 Orthostatic hypotension: Secondary | ICD-10-CM | POA: Diagnosis not present

## 2017-07-04 DIAGNOSIS — I2510 Atherosclerotic heart disease of native coronary artery without angina pectoris: Secondary | ICD-10-CM | POA: Diagnosis not present

## 2017-07-04 DIAGNOSIS — E1142 Type 2 diabetes mellitus with diabetic polyneuropathy: Secondary | ICD-10-CM | POA: Diagnosis not present

## 2017-07-06 DIAGNOSIS — I272 Pulmonary hypertension, unspecified: Secondary | ICD-10-CM | POA: Diagnosis not present

## 2017-07-06 DIAGNOSIS — M6281 Muscle weakness (generalized): Secondary | ICD-10-CM | POA: Diagnosis not present

## 2017-07-06 DIAGNOSIS — E1142 Type 2 diabetes mellitus with diabetic polyneuropathy: Secondary | ICD-10-CM | POA: Diagnosis not present

## 2017-07-06 DIAGNOSIS — I2510 Atherosclerotic heart disease of native coronary artery without angina pectoris: Secondary | ICD-10-CM | POA: Diagnosis not present

## 2017-07-06 DIAGNOSIS — R296 Repeated falls: Secondary | ICD-10-CM | POA: Diagnosis not present

## 2017-07-06 DIAGNOSIS — I951 Orthostatic hypotension: Secondary | ICD-10-CM | POA: Diagnosis not present

## 2017-07-07 DIAGNOSIS — R296 Repeated falls: Secondary | ICD-10-CM | POA: Diagnosis not present

## 2017-07-07 DIAGNOSIS — E1142 Type 2 diabetes mellitus with diabetic polyneuropathy: Secondary | ICD-10-CM | POA: Diagnosis not present

## 2017-07-07 DIAGNOSIS — M6281 Muscle weakness (generalized): Secondary | ICD-10-CM | POA: Diagnosis not present

## 2017-07-07 DIAGNOSIS — I2510 Atherosclerotic heart disease of native coronary artery without angina pectoris: Secondary | ICD-10-CM | POA: Diagnosis not present

## 2017-07-07 DIAGNOSIS — I272 Pulmonary hypertension, unspecified: Secondary | ICD-10-CM | POA: Diagnosis not present

## 2017-07-07 DIAGNOSIS — I951 Orthostatic hypotension: Secondary | ICD-10-CM | POA: Diagnosis not present

## 2017-07-10 ENCOUNTER — Ambulatory Visit (INDEPENDENT_AMBULATORY_CARE_PROVIDER_SITE_OTHER): Payer: Medicare Other | Admitting: Neurology

## 2017-07-10 DIAGNOSIS — R5383 Other fatigue: Secondary | ICD-10-CM

## 2017-07-10 DIAGNOSIS — Z9981 Dependence on supplemental oxygen: Secondary | ICD-10-CM

## 2017-07-10 DIAGNOSIS — G4730 Sleep apnea, unspecified: Secondary | ICD-10-CM

## 2017-07-10 DIAGNOSIS — G4710 Hypersomnia, unspecified: Secondary | ICD-10-CM | POA: Diagnosis not present

## 2017-07-10 DIAGNOSIS — G4733 Obstructive sleep apnea (adult) (pediatric): Secondary | ICD-10-CM

## 2017-07-12 DIAGNOSIS — E1142 Type 2 diabetes mellitus with diabetic polyneuropathy: Secondary | ICD-10-CM | POA: Diagnosis not present

## 2017-07-12 DIAGNOSIS — I951 Orthostatic hypotension: Secondary | ICD-10-CM | POA: Diagnosis not present

## 2017-07-12 DIAGNOSIS — I2510 Atherosclerotic heart disease of native coronary artery without angina pectoris: Secondary | ICD-10-CM | POA: Diagnosis not present

## 2017-07-12 DIAGNOSIS — I272 Pulmonary hypertension, unspecified: Secondary | ICD-10-CM | POA: Diagnosis not present

## 2017-07-12 DIAGNOSIS — M6281 Muscle weakness (generalized): Secondary | ICD-10-CM | POA: Diagnosis not present

## 2017-07-12 DIAGNOSIS — R296 Repeated falls: Secondary | ICD-10-CM | POA: Diagnosis not present

## 2017-07-14 DIAGNOSIS — N390 Urinary tract infection, site not specified: Secondary | ICD-10-CM | POA: Diagnosis not present

## 2017-07-14 DIAGNOSIS — Z6832 Body mass index (BMI) 32.0-32.9, adult: Secondary | ICD-10-CM | POA: Diagnosis not present

## 2017-07-17 DIAGNOSIS — I272 Pulmonary hypertension, unspecified: Secondary | ICD-10-CM | POA: Diagnosis not present

## 2017-07-17 DIAGNOSIS — M6281 Muscle weakness (generalized): Secondary | ICD-10-CM | POA: Diagnosis not present

## 2017-07-17 DIAGNOSIS — R296 Repeated falls: Secondary | ICD-10-CM | POA: Diagnosis not present

## 2017-07-17 DIAGNOSIS — I2510 Atherosclerotic heart disease of native coronary artery without angina pectoris: Secondary | ICD-10-CM | POA: Diagnosis not present

## 2017-07-17 DIAGNOSIS — I951 Orthostatic hypotension: Secondary | ICD-10-CM | POA: Diagnosis not present

## 2017-07-17 DIAGNOSIS — E1142 Type 2 diabetes mellitus with diabetic polyneuropathy: Secondary | ICD-10-CM | POA: Diagnosis not present

## 2017-07-19 DIAGNOSIS — E1142 Type 2 diabetes mellitus with diabetic polyneuropathy: Secondary | ICD-10-CM | POA: Diagnosis not present

## 2017-07-19 DIAGNOSIS — I272 Pulmonary hypertension, unspecified: Secondary | ICD-10-CM | POA: Diagnosis not present

## 2017-07-19 DIAGNOSIS — M6281 Muscle weakness (generalized): Secondary | ICD-10-CM | POA: Diagnosis not present

## 2017-07-19 DIAGNOSIS — R296 Repeated falls: Secondary | ICD-10-CM | POA: Diagnosis not present

## 2017-07-19 DIAGNOSIS — I2510 Atherosclerotic heart disease of native coronary artery without angina pectoris: Secondary | ICD-10-CM | POA: Diagnosis not present

## 2017-07-19 DIAGNOSIS — I951 Orthostatic hypotension: Secondary | ICD-10-CM | POA: Diagnosis not present

## 2017-07-20 DIAGNOSIS — E1142 Type 2 diabetes mellitus with diabetic polyneuropathy: Secondary | ICD-10-CM | POA: Diagnosis not present

## 2017-07-20 DIAGNOSIS — R296 Repeated falls: Secondary | ICD-10-CM | POA: Diagnosis not present

## 2017-07-20 DIAGNOSIS — I951 Orthostatic hypotension: Secondary | ICD-10-CM | POA: Diagnosis not present

## 2017-07-20 DIAGNOSIS — I2510 Atherosclerotic heart disease of native coronary artery without angina pectoris: Secondary | ICD-10-CM | POA: Diagnosis not present

## 2017-07-20 DIAGNOSIS — I272 Pulmonary hypertension, unspecified: Secondary | ICD-10-CM | POA: Diagnosis not present

## 2017-07-20 DIAGNOSIS — M6281 Muscle weakness (generalized): Secondary | ICD-10-CM | POA: Diagnosis not present

## 2017-07-25 DIAGNOSIS — M1711 Unilateral primary osteoarthritis, right knee: Secondary | ICD-10-CM | POA: Diagnosis not present

## 2017-07-25 DIAGNOSIS — M25561 Pain in right knee: Secondary | ICD-10-CM | POA: Diagnosis not present

## 2017-07-26 ENCOUNTER — Other Ambulatory Visit: Payer: Self-pay | Admitting: Neurology

## 2017-07-26 DIAGNOSIS — M4712 Other spondylosis with myelopathy, cervical region: Secondary | ICD-10-CM

## 2017-07-26 DIAGNOSIS — I639 Cerebral infarction, unspecified: Secondary | ICD-10-CM

## 2017-07-26 DIAGNOSIS — G3184 Mild cognitive impairment, so stated: Secondary | ICD-10-CM

## 2017-07-26 DIAGNOSIS — G4730 Sleep apnea, unspecified: Secondary | ICD-10-CM

## 2017-07-26 DIAGNOSIS — G4710 Hypersomnia, unspecified: Secondary | ICD-10-CM

## 2017-07-26 DIAGNOSIS — Z9981 Dependence on supplemental oxygen: Secondary | ICD-10-CM

## 2017-07-26 DIAGNOSIS — G4733 Obstructive sleep apnea (adult) (pediatric): Secondary | ICD-10-CM

## 2017-07-26 DIAGNOSIS — M5412 Radiculopathy, cervical region: Secondary | ICD-10-CM

## 2017-07-26 NOTE — Procedures (Signed)
[place], [place] [address] NAME:  [name]                                                                DOB: December 03, 1932 MEDICAL RECORD [id number]                                             DOS:  07/10/2017 REFERRING PHYSICIAN: Shon Baton, MD STUDY PERFORMED: Home Sleep Study by apnea link HISTORY: Linda Oliver is a 84 y.o. female. I had the pleasure of seeing [name] in 2010 when she underwent a polysomnography on 11/10/2008 referred by Dr. Manuella Ghazi when she presented with a history of diabetes, GERD, hypertension, neuropathy, coronary artery disease, pernicious anemia and had reported excessive daytime sleepiness and fatigue as well as observed apneas and snoring.  She had a triple bypass in the 1990s, cholecystectomy hysterectomy hand surgery for carpal tunnel, shoulder surgery on both sides the last hand surgery was in February 2019.   The sleep study revealed an AHI of 10.6 /h, with 8 apneas and 66 hypopneas, no REM sleep was seen. The patient was placed on CPAP at the time and there was later oxygen ordered as well.  She was set up with [facility] but she states that she could not get service from [place] over the last 2 years and then stopped using the machine.  The patient reports that her sleep is still very fragmented and that since she is not using CPAP or oxygen now she does feel a little bit more sleepy and fatigued again.  She would like to return to treatment. BMI: 34.2, Epworth-9  STUDY RESULTS:  Total Recording Time: 8 hours and 9 minutes, valid test time 7 h and 31 min. Total Apnea/Hypopnea Index (AHI): 13.4/h; RDI:  13.6 /h Average Oxygen Saturation:  92 %; Lowest Oxygen Saturation: 82 %  Total Time Oxygen Saturation below 89 %: 1h and 37 minutes  Average Heart Rate:   65 bpm (between 48 and 90 bpm) IMPRESSION: Mild sleep apnea, comparable with the results from 2010, associated with prolonged oxygen desaturation time. RECOMMENDATION: The combination of Hypoxemia  and Sleep apnea requires PAP therapy.  I cannot state if additional oxygen will be needed, but could determine this after an attended CPAP titration study which can identify need for oxygen and allows [place] to titrate 02 as well.    I certify that I have reviewed the raw data recording prior to the issuance of this report in accordance with the standards of the American Academy of Sleep Medicine (AASM). Larey Seat, M.D.    07-26-2017    Medical Director of [facility] at [facility], accredited by the AASM. Diplomat of the ABPN and ABSM.

## 2017-07-27 ENCOUNTER — Telehealth: Payer: Self-pay | Admitting: Neurology

## 2017-07-27 NOTE — Telephone Encounter (Signed)
-----   Message from Larey Seat, MD sent at 07/26/2017  6:48 PM EDT ----- IMPRESSION: Mild sleep apnea, comparable with the results from  2010, associated with prolonged oxygen desaturation time. RECOMMENDATION: The combination of Hypoxemia and Sleep apnea  requires PAP therapy.  I cannot state if additional oxygen will be needed, but could  determine this after an attended CPAP titration study which can  identify need for oxygen and allows [place] to titrate 02 as well.

## 2017-07-27 NOTE — Telephone Encounter (Signed)
I called pt. I advised pt that Dr. Brett Fairy reviewed their sleep study results and found that mild sleep apnea and recommends that pt be treated with a cpap. Dr. Brett Fairy recommends that pt return for a repeat sleep study in order to properly titrate the cpap and ensure a good mask fit. Pt is agreeable to returning for a titration study. I advised pt that our sleep lab will file with pt's insurance and call pt to schedule the sleep study when we hear back from the pt's insurance regarding coverage of this sleep study. Pt verbalized understanding of results. Pt had no questions at this time but was encouraged to call back if questions arise.

## 2017-08-01 DIAGNOSIS — M1711 Unilateral primary osteoarthritis, right knee: Secondary | ICD-10-CM | POA: Diagnosis not present

## 2017-08-01 DIAGNOSIS — M25561 Pain in right knee: Secondary | ICD-10-CM | POA: Diagnosis not present

## 2017-08-03 ENCOUNTER — Ambulatory Visit (INDEPENDENT_AMBULATORY_CARE_PROVIDER_SITE_OTHER): Payer: Medicare Other | Admitting: Neurology

## 2017-08-03 DIAGNOSIS — G4733 Obstructive sleep apnea (adult) (pediatric): Secondary | ICD-10-CM

## 2017-08-03 DIAGNOSIS — G4710 Hypersomnia, unspecified: Secondary | ICD-10-CM

## 2017-08-03 DIAGNOSIS — M4712 Other spondylosis with myelopathy, cervical region: Secondary | ICD-10-CM

## 2017-08-03 DIAGNOSIS — I639 Cerebral infarction, unspecified: Secondary | ICD-10-CM

## 2017-08-03 DIAGNOSIS — G3184 Mild cognitive impairment, so stated: Secondary | ICD-10-CM

## 2017-08-03 DIAGNOSIS — G4730 Sleep apnea, unspecified: Secondary | ICD-10-CM

## 2017-08-03 DIAGNOSIS — Z9981 Dependence on supplemental oxygen: Secondary | ICD-10-CM

## 2017-08-03 DIAGNOSIS — M5412 Radiculopathy, cervical region: Secondary | ICD-10-CM

## 2017-08-08 DIAGNOSIS — M25561 Pain in right knee: Secondary | ICD-10-CM | POA: Diagnosis not present

## 2017-08-08 DIAGNOSIS — M1711 Unilateral primary osteoarthritis, right knee: Secondary | ICD-10-CM | POA: Diagnosis not present

## 2017-08-10 NOTE — Addendum Note (Signed)
Addended by: Larey Seat on: 08/10/2017 07:00 PM   Modules accepted: Orders

## 2017-08-10 NOTE — Procedures (Signed)
PATIENT'S NAME:  Linda Oliver, Linda Oliver DOB:      11-Nov-1932      MR#:    [id number]     DATE OF RECORDING: 08/03/2017 REFERRING M.D.:  Shon Baton, MD Study Performed:   Titration to Positive Airway Pressure and Oxygen  HISTORY:  Linda Oliver. Henney is an 84 y.o. female patient and former CPAP user. The patient reports that she would like to return to treatment. Her HST from 07/10/2017 revealed an Apnea/Hypopnea Index (AHI): 13.4/h, Total Time Oxygen Saturation below 89 %: 1h and 37 minutes.  The patient endorsed the Epworth Sleepiness Scale at 9 points.   The patient's weight 174 pounds with a height of 60 (inches), resulting in a BMI of 34.2 kg/m2. The patient's neck circumference measured 14 inches. She was asked to return for CPAP and, if needed, oxygen titration.   CURRENT MEDICATIONS: Tylenol, Mylanta, Aspirin, Lipitor, Lioresal, Wellbutrin, Oscal w/Vitamin D, Celexa, Neurontin, Hydrodiuril, Claritin, Multivitamin, Namziric, Nystatin, Mycolog II, Miralax, Potassium chloride, Carafate, Ultram   PROCEDURE:  This is a multichannel digital polysomnogram utilizing the SomnoStar 11.2 system.  Electrodes and sensors were applied and monitored per AASM Specifications.   EEG, EOG, Chin and Limb EMG, were sampled at 200 Hz.  ECG, Snore and Nasal Pressure, Thermal Airflow, Respiratory Effort, CPAP Flow and Pressure, Oximetry was sampled at 50 Hz. Digital video and audio were recorded.      CPAP was initiated at 5 cmH20 with heated humidity per AASM split night standards and pressure was advanced to 6, 7 and finally 8 cmH20 because of hypopneas, apneas and desaturations.  At a PAP pressure of 8 cmH20, there was a reduction of the AHI to 0.3 with 99.7% sleep efficiency and correction of hypoxemia.   A nasal ESON 2 mask in small size was used.   Lights Out was at 22:24 and Lights On at 04:59. Total recording time (TRT) was 396 minutes, with a total sleep time (TST) of 365.5 minutes. The patient's sleep latency was 34  minutes. REM latency was 0 minutes.  The sleep efficiency was 92.3 %.    SLEEP ARCHITECTURE: WASO (Wake after sleep onset) was 21 minutes.  There were 8.5 minutes in Stage N1, 224.5 minutes Stage N2, 132.5 minutes Stage N3 and 0 minutes in Stage REM.  The percentage of Stage N1 was 2.3%, Stage N2 was 61.4%, Stage N3 was 36.3% and Stage R (REM sleep) was 0%. The sleep architecture was notable for REM void.   RESPIRATORY ANALYSIS:  There was a total of 8 respiratory events: 4 obstructive apneas, 2 central apneas and 0 mixed apneas with a total of 6 apneas and an apnea index (AI) of 1. /hour. There were 2 hypopneas with a hypopnea index of .3/hour. The patient also had 0 respiratory event related arousals (RERAs).      The total APNEA/HYPOPNEA INDEX (AHI) was 1.30. /hour and the total RESPIRATORY DISTURBANCE INDEX was 1.3 0./hour  0 events occurred in REM sleep and 8 events in NREM. The REM AHI was 0 /hour versus a non-REM AHI of 1.3 /hour.  The patient spent 0 minutes of total sleep time in the supine position and 366 minutes in non-supine. The supine AHI was 0.0, versus a non-supine AHI of 1.3.  OXYGEN SATURATION & C02:  The baseline 02 saturation was 92%, with the lowest being 82%. Time spent below 89% saturation equaled still 80 minutes. Oxygen was supplemented at 2 liters since CPAP alone did not alleviate hypoxemia.  PERIODIC LIMB MOVEMENTS:  The patient had 30 Periodic Limb Movements. The Periodic Limb Movement (PLM) index was 4.9 and the PLM Arousal index was 1.5 /hour. The arousals were noted as: 22 were spontaneous, 9 were associated with PLMs, and only 1 associated with respiratory events.  Audio and video analysis did not show any abnormal or unusual movements, behaviors, phonations or vocalizations.  No nocturia. EKG was in keeping with sinus rhythm (NSR) and PVCs.  Post-study, the patient indicated that sleep was better than usual.  The patient was fitted with a Small Eson 2 Nasal mask  and used 2 liters of 0xygen bled into CPAP.   DIAGNOSIS 1. Obstructive Sleep Apnea responded well to CPAP at 8 cm water.  2. Sleep Related Hypoxemia remained for 80 minutes, and 2 liter of oxygen were added.  3. EKG with PVC 4. Snoring resolved at last explored pressure of 8 cm water.   PLANS/RECOMMENDATIONS: CPAP auto titration capable at 6 through 13 cm water pressure with 3 cm EPR, The patient was fitted with a Small Eson 2 Nasal mask and used 2 liters of 0xygen bled into CPAP.  The patient should avoid evening sedatives, hypnotics, and alcohol beverage consumption  A follow up appointment will be scheduled in the Sleep Clinic at [facility].   Please call [phone] with any questions.      I certify that I have reviewed the entire raw data recording prior to the issuance of this report in accordance with the Standards of Accreditation of the American Academy of Sleep Medicine (AASM)   Larey Seat, M.D. 08-10-2017  Diplomat, American Board of Psychiatry and Neurology  Diplomat, [place] of Sleep Medicine Medical Director of Sleep Medicine at [place] at [facility].

## 2017-08-14 ENCOUNTER — Telehealth: Payer: Self-pay | Admitting: Neurology

## 2017-08-14 DIAGNOSIS — R55 Syncope and collapse: Secondary | ICD-10-CM | POA: Diagnosis not present

## 2017-08-14 DIAGNOSIS — I951 Orthostatic hypotension: Secondary | ICD-10-CM | POA: Diagnosis not present

## 2017-08-14 DIAGNOSIS — I2510 Atherosclerotic heart disease of native coronary artery without angina pectoris: Secondary | ICD-10-CM | POA: Diagnosis not present

## 2017-08-14 DIAGNOSIS — I10 Essential (primary) hypertension: Secondary | ICD-10-CM | POA: Diagnosis not present

## 2017-08-14 NOTE — Telephone Encounter (Signed)
Pt returned call.  I advised pt that Dr. Brett Fairy reviewed their sleep study results and found that pt has sleep apnea. Dr. Brett Fairy recommends that pt starts a auto CPAP. I reviewed PAP compliance expectations with the pt. Pt is agreeable to starting a CPAP. I advised pt that an order will be sent to a DME, [facility], and [facility] will call the pt within about one week after they file with the pt's insurance. [facility] will show the pt how to use the machine, fit for masks, and troubleshoot the CPAP if needed. A follow up appt was made for insurance purposes with Dr. Brett Fairy on Nov 12,2019 at 2:30 pm. Pt verbalized understanding to arrive 15 minutes early and bring their CPAP. A letter with all of this information in it will be mailed to the pt as a reminder. I verified with the pt that the address we have on file is correct. Pt verbalized understanding of results. Pt had no questions at this time but was encouraged to call back if questions arise.

## 2017-08-14 NOTE — Telephone Encounter (Signed)
-----   Message from Larey Seat, MD sent at 08/10/2017  6:59 PM EDT ----- DIAGNOSIS 1. Obstructive Sleep Apnea responded well to CPAP at 8 cm water.  2. Sleep Related Hypoxemia remained for 80 minutes, and 2 liter  of oxygen were added.  3. EKG with PVC 4. Snoring resolved at last explored pressure of 8 cm water.   PLANS/RECOMMENDATIONS: CPAP auto titration capable at 6 through  13 cm water pressure with 3 cm EPR, The patient was fitted with a  Small [place] 2 Nasal mask and used 2 liters of 0xygen bled into  CPAP.

## 2017-08-14 NOTE — Telephone Encounter (Signed)
Called patient to discuss sleep study results. No answer at this time. LVM for the patient to call back.   

## 2017-08-15 DIAGNOSIS — M25561 Pain in right knee: Secondary | ICD-10-CM | POA: Diagnosis not present

## 2017-08-15 DIAGNOSIS — M1711 Unilateral primary osteoarthritis, right knee: Secondary | ICD-10-CM | POA: Diagnosis not present

## 2017-08-22 DIAGNOSIS — M25561 Pain in right knee: Secondary | ICD-10-CM | POA: Diagnosis not present

## 2017-08-22 DIAGNOSIS — M1711 Unilateral primary osteoarthritis, right knee: Secondary | ICD-10-CM | POA: Diagnosis not present

## 2017-10-04 ENCOUNTER — Ambulatory Visit: Payer: Medicare Other | Admitting: Neurology

## 2017-10-16 DIAGNOSIS — I700 Atherosclerosis of aorta: Secondary | ICD-10-CM | POA: Diagnosis not present

## 2017-10-16 DIAGNOSIS — I951 Orthostatic hypotension: Secondary | ICD-10-CM | POA: Diagnosis not present

## 2017-10-16 DIAGNOSIS — E668 Other obesity: Secondary | ICD-10-CM | POA: Diagnosis not present

## 2017-10-16 DIAGNOSIS — Z23 Encounter for immunization: Secondary | ICD-10-CM | POA: Diagnosis not present

## 2017-10-16 DIAGNOSIS — F329 Major depressive disorder, single episode, unspecified: Secondary | ICD-10-CM | POA: Diagnosis not present

## 2017-10-16 DIAGNOSIS — I119 Hypertensive heart disease without heart failure: Secondary | ICD-10-CM | POA: Diagnosis not present

## 2017-10-16 DIAGNOSIS — R2689 Other abnormalities of gait and mobility: Secondary | ICD-10-CM | POA: Diagnosis not present

## 2017-10-16 DIAGNOSIS — R413 Other amnesia: Secondary | ICD-10-CM | POA: Diagnosis not present

## 2017-10-16 DIAGNOSIS — Z6832 Body mass index (BMI) 32.0-32.9, adult: Secondary | ICD-10-CM | POA: Diagnosis not present

## 2017-10-16 DIAGNOSIS — E1149 Type 2 diabetes mellitus with other diabetic neurological complication: Secondary | ICD-10-CM | POA: Diagnosis not present

## 2017-10-16 DIAGNOSIS — D692 Other nonthrombocytopenic purpura: Secondary | ICD-10-CM | POA: Diagnosis not present

## 2017-10-16 DIAGNOSIS — Z1389 Encounter for screening for other disorder: Secondary | ICD-10-CM | POA: Diagnosis not present

## 2017-10-16 DIAGNOSIS — Z794 Long term (current) use of insulin: Secondary | ICD-10-CM | POA: Diagnosis not present

## 2017-11-15 DIAGNOSIS — I6521 Occlusion and stenosis of right carotid artery: Secondary | ICD-10-CM | POA: Diagnosis not present

## 2017-11-16 ENCOUNTER — Encounter: Payer: Self-pay | Admitting: Neurology

## 2017-11-16 ENCOUNTER — Ambulatory Visit (INDEPENDENT_AMBULATORY_CARE_PROVIDER_SITE_OTHER): Payer: Medicare Other | Admitting: Neurology

## 2017-11-16 ENCOUNTER — Encounter

## 2017-11-16 VITALS — BP 105/50 | HR 62 | Ht 60.0 in | Wt 175.0 lb

## 2017-11-16 DIAGNOSIS — I639 Cerebral infarction, unspecified: Secondary | ICD-10-CM

## 2017-11-16 DIAGNOSIS — R4189 Other symptoms and signs involving cognitive functions and awareness: Secondary | ICD-10-CM

## 2017-11-16 DIAGNOSIS — G3184 Mild cognitive impairment, so stated: Secondary | ICD-10-CM

## 2017-11-16 NOTE — Progress Notes (Signed)
[facility]   Provider: Dr Jaynee Eagles Referring Provider: Shon Baton, MD Primary Care Physician: Precious Reel, MD CC: Memory loss  Interval history 11/16/2017: Patient is here for follow up,  She was diagnosed with sleep apnea. She is doing well with her cpap. She feels her memory is stable. Report shows compliance. She is using it.  She is feeling less tired. Stable. Formal memory testing recommended for this patient who has been c/o memory impairment for years however her memory remains stable as do her testing MMSEs.   MRI brain 09/27/2016: nothing acute, remote small infarct superior left cerebellar hemisphere, moderate cerebral atrophy and mild cerebellar atrophy.   Interval history 11/08/2016: Feels her memory is stable.Discussed MCI, formal neurocognitive testing. Discussed clinical trials including [employer]. She is already on Aricept and Namenda. Feels her memory is stable. Here with her son-in-law who also provides information. Provided information all of the above. I also reviewed MRI of the brain and cervical spine. 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 She had some degenerative changes in the neck but no myelopathy or cervical stenosis. EMG nerve conduction study showed peripheral neuropathy, bilateral carpal tunnel syndrome but no etiology for her proximal weakness such as myopathy myositis or neuromuscular disorder.  Interval history 09/21/2016: She is here with weakness in the arms. She was taken off of crestor with no release. She has an aching in her arms, can be a 10/10. Started 3 months ago when she started using the piano. She wuite playing the piano. She has neck problems and a stiff neck. More in the forearm and proximal arm. No difficulty lifting arms overhead, doesn't get tired easily brushing hair, can't lift  the iron skillet. No problem with the legs. Symptoms are symmetric and she has tingling in the fingers. Worse during the day. No ptosis, diplopia, no new swallowing problems or new shortness or breath.   Interval history 11/04/2015: She goes to [place] every weekend to visit her husband in the [place]. 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 They went to [facility] near [place] for a few days. Here with daughter who also provides information. It is dog friendly there and they like it their. Holidays at her daughter's house Freda Munro). Feels memory is stable. She is on Namzaric for memory. Cymbalta was increased for anxiety. She was "jumping out of her skin" which has helped with her anxiety. She takes b12 orally every day.   Interval History 10/06/2014: She is doing great. The medication really works for her. She takes oral B12 daily. No side effects from the Namzaric. Feels her memory is stable, no progression. No behavioral symptoms. Some mild anxiety but nothing significant. She is still driving and goes to therapy. She still has some dizziness and is in therapy. She has a recumbent bike that she uses. She is on gabapentin and cymbalta for neuropathy. She is here with her daughter.  Interval update 02/19/2014: SHERISE GEERDES is a 84 y.o. female here as a follow up. She is a former patient of Dr. Janann Colonel. Has history of DM, HLD, HTN. Has neuropathy for which she takes Cymbalta and Gabapentin. Has occasional episodes of dizziness which she takes meclizine for. No known family history of cognitive decline. Has been told her B12 is low and has received injections in the past, currently taking oral B12 daily. She feels great, feels Aricept  is making a significant difference. No side effects from the medication. She has decreased her Neurontin since starting Cymbalta and would like to try and decrease further.   10/09/2013 visit: ABBEYGAIL IGOE is a 84 y.o. female here as a follow up from Dr. Virgina Jock for  cognitive decline. Initial visit was 03/2013 at which time she had an unremarkable brain MRI and lab workup. She was started on Aricept 5mg  daily. Returns today with continued concerns over short term memory. Patient notes continued difficulty with getting lost, has a hard time getting around. She will have good and bad days, some days she is more confused and disoriented. Has gotten lost driving a few times. Her daughter notes some improvement since being on the medication. She feels that the patient is more alert and interactive. Notes some gait instability and dizziness but no falls.   Initial visit 04/09/2013: Daughter notes symptoms have been ongoing for a few years and have gotten progressively worse. Daughter notices she will be telling a story and then just forget what comes next. Trouble with short term memory, difficulty recalling what they just talked about. Currently driving, denies any difficulty, no accidents. Has noted some episodes of getting lost. Wakes up frequently overnight with need to urinate. She notes one episode of a visual hallucination, described as a hazy shadow. Notes some difficulty with remote memory but overall feels that is still intact.   She currently lives alone, her daughter takes care of finances and medications, has been doing this for the past few years. Has been hospitalized a few times in the past with question of stroke, no stroke noted and daughter suspects patients may have been managing her medications incorrectly in the past.      Review of Systems: Patient complains of symptoms per HPI as well as the following symptoms: no CP, no SOB. Pertinent negatives and positives per HPI. All others negative.   Social History   Socioeconomic History  . Marital status: Married    Spouse name: Not on file  . Number of children: 1  . Years of education: 64  . Highest education level: Not on file  Occupational History  . Not on file  Social Needs  .  Financial resource strain: Not on file  . Food insecurity:    Worry: Not on file    Inability: Not on file  . Transportation needs:    Medical: Not on file    Non-medical: Not on file  Tobacco Use  . Smoking status: Former Smoker    Packs/day: 1.00    Years: 30.00    Pack years: 30.00    Types: Cigarettes    Last attempt to quit: 11/25/1986    Years since quitting: 31.0  . Smokeless tobacco: Never Used  Substance and Sexual Activity  . Alcohol use: No  . Drug use: No  . Sexual activity: Yes    Birth control/protection: Post-menopausal  Lifestyle  . Physical activity:    Days per week: Not on file    Minutes per session: Not on file  . Stress: Not on file  Relationships  . Social connections:    Talks on phone: Not on file    Gets together: Not on file    Attends religious service: Not on file    Active member of club or organization: Not on file    Attends meetings of clubs or organizations: Not on file    Relationship status: Not on file  . Intimate partner  violence:    Fear of current or ex partner: Not on file    Emotionally abused: Not on file    Physically abused: Not on file    Forced sexual activity: Not on file  Other Topics Concern  . Not on file  Social History Narrative   Married, has 1 child   Caffeine use: none    Family History  Problem Relation Age of Onset  . Coronary artery disease Mother   . Heart disease Father   . Cancer Father        skin  . Cancer Brother   . Cancer - Colon Brother        brain, Lymphoma    Past Medical History:  Diagnosis Date  . Anginal pain ([place])    occ  . Anxiety   . Broken ankle   . Bullous pemphigus   . CAD (coronary artery disease)    Cope   80% stenosis ostial Left main, 30% stenosis proximal LAD, 80% stenosis proximal RCA CABG  -  LIMA to LAD, SVG to int, SVG to [place], [place]   . Depression   . Diabetic peripheral neuropathy ([place])   . GERD (gastroesophageal reflux disease)    . Headache(784.0)    hx migraines  . Heart murmur    mvp  . Hyperlipidemia   . Hypertension   . Hypertensive heart disease   . Nasal bleeding    occ due to O2  . OA (osteoarthritis)   . Obesity (BMI 30-39.9)   . Osteopenia   . Peripheral neuropathy   . Seizure ([place])    2009  . Sleep apnea    dx 19yrs ago doesnt use cpap but uses O2 at nite 2L  . Stones in the urinary tract   . Type 2 diabetes mellitus with vascular disease ([place])   . UTI (lower urinary tract infection)     Past Surgical History:  Procedure Laterality Date  . ABDOMINAL HYSTERECTOMY  70's  . CARDIAC CATHETERIZATION    . CARPAL TUNNEL RELEASE Bilateral   . CHOLECYSTECTOMY    . CORONARY ARTERY BYPASS GRAFT  96  . SHOULDER ARTHROSCOPY     rt  . SHOULDER ARTHROSCOPY WITH ROTATOR CUFF REPAIR AND SUBACROMIAL DECOMPRESSION  12/01/2011   Procedure: SHOULDER ARTHROSCOPY WITH ROTATOR CUFF REPAIR AND SUBACROMIAL DECOMPRESSION;  Surgeon: Nita Sells, MD;  Location: [facility];  Service: Orthopedics;  Laterality: Left;  shoulder debridement calcific tendonitis    Current Outpatient Medications  Medication Sig Dispense Refill  . acetaminophen (TYLENOL) 500 MG tablet Take 500 mg by mouth every 4 (four) hours as needed for moderate pain.    Marland Kitchen aspirin 325 MG tablet Take 325 mg by mouth daily.    . busPIRone (BUSPAR) 7.5 MG tablet Take 7.5 mg by mouth 2 (two) times daily.    . cyanocobalamin 1000 MCG tablet Take 1,000 mcg by mouth daily.    . DULoxetine (CYMBALTA) 60 MG capsule Take 60 mg by mouth daily.    . fesoterodine (TOVIAZ) 8 MG TB24 tablet Take 8 mg by mouth daily.    . fluticasone (FLONASE) 50 MCG/ACT nasal spray Place 1 spray into both nostrils 2 (two) times daily as needed for allergies or rhinitis.    Marland Kitchen gabapentin (NEURONTIN) 300 MG capsule Take 300 mg by mouth 2 (two) times daily.    Marland Kitchen glimepiride (AMARYL) 1 MG tablet Take 1 mg by mouth daily.    . hydrOXYzine (  ATARAX/VISTARIL) 25 MG tablet Take 25 mg by  mouth 2 (two) times daily.    . Insulin Glargine (LANTUS [place]) Inject 40 Units into the skin daily.    . metoprolol succinate (TOPROL-XL) 25 MG 24 hr tablet Take 12.5 mg by mouth 2 (two) times daily.    Marland Kitchen NAMZARIC 28-10 MG CP24 TAKE 1 CAPSULE DAILY 90 capsule 3  . pantoprazole (PROTONIX) 40 MG tablet Take 40 mg by mouth daily.    . rosuvastatin (CRESTOR) 10 MG tablet Take 10 mg by mouth daily.    . sitaGLIPtin (JANUVIA) 100 MG tablet Take 100 mg by mouth daily.    . valsartan-hydrochlorothiazide (DIOVAN-HCT) 160-25 MG tablet Take 0.5 tablets by mouth daily.     No current facility-administered medications for this visit.     Allergies as of 11/16/2017 - Review Complete 11/16/2017  Allergen Reaction Noted  . Percocet [oxycodone-acetaminophen] Nausea And Vomiting 09/30/2011  . Latex Itching and Rash 11/22/2011    Vitals: BP (!) 105/50 (BP Location: Left Arm, Patient Position: Sitting)   Pulse 62   Ht 5' (1.524 m)   Wt 175 lb (79.4 kg)   BMI 34.18 kg/m  Last Weight:  Wt Readings from Last 1 Encounters:  11/16/17 175 lb (79.4 kg)   Last Height:   Ht Readings from Last 1 Encounters:  11/16/17 5' (1.524 m)   Neuro: Detailed Neurologic Exam  Speech:    Speech is normal; fluent and spontaneous with normal comprehension.  Cognition:  MMSE - Mini Mental State Exam 11/16/2017 11/08/2016  Orientation to time 5 5  Orientation to Place 5 5  Registration 3 3  Attention/ Calculation 3 5  Recall 2 2  Language- name 2 objects 2 2  Language- repeat 1 1  Language- follow 3 step command 3 3  Language- read & follow direction 1 1  Write a sentence 1 1  Copy design 1 1  Total score 27 29   Cranial Nerves:    The pupils are equal, round, and reactive to light. Extraocular movements are intact. Trigeminal sensation is intact and the muscles of mastication are normal. The face is symmetric. The palate elevates in the midline. Hearing intact. Voice is normal. Shoulder shrug is normal. The  tongue has normal motion without fasciculations.  Motor Observation:    No asymmetry, no atrophy, and no involuntary movements noted. Tone:    Normal muscle tone.    Posture:    Posture is mildly stooped    Strength:  Weakness globally in the upper extremitites with giveway likely due to pain, more proximal but definitely throughout dital to proximal Mild hip flexion weakness otherwise strength 5/5         Sensation: intact to LT     Reflex Exam:  DTR's:    Absent AJs otherwise brisk for age.       Assessment/Plan:85 woman presenting for follow up evaluation of stable cognitive decline. Patient and daughter and son-in-law note it is predominantly a problem with short term memory and they feel it has been getting progressively better with Namzeric and with sleep apnea treatment now on cpap. MRI brain and lab workup unremarkable. Stable  - Discussed formal neurocognitive testing. 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 WIll order formal neuropsych testing have been seeing her for years now need a formal diagnosis.  Orders Placed This Encounter  Procedures  . Ambulatory referral to Neuropsychology     -  MRI of the brain and cervical spine completed in 09/2016. 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 She had some degenerative changes in the neck but no myelopathy or cervical stenosis.    Proximal weakness primarily in the upper extremities: EMG nerve conduction study showed peripheral neuropathy, bilateral carpal tunnel syndrome but no etiology for her proximal weakness such as myopathy myositis or neuromuscular disorder.  They decline further workup including clinical trials, formal neurocognitive testing. Continue Namenda and Aricept.  Sarina Ill, MD  [facility] [address] [place], [place] [address]  Phone [phone] Fax [phone]  A total of 25 minutes was spent face-to-face with this patient. Over half this time was spent on counseling patient on the  1. Subjective memory complaints   2. MCI (mild cognitive impairment) with memory loss    diagnosis and different diagnostic and therapeutic options available.

## 2017-11-16 NOTE — Patient Instructions (Signed)

## 2017-11-19 ENCOUNTER — Encounter: Payer: Self-pay | Admitting: Neurology

## 2017-11-19 DIAGNOSIS — R4189 Other symptoms and signs involving cognitive functions and awareness: Secondary | ICD-10-CM | POA: Insufficient documentation

## 2017-11-21 ENCOUNTER — Ambulatory Visit (INDEPENDENT_AMBULATORY_CARE_PROVIDER_SITE_OTHER): Payer: Medicare Other | Admitting: Neurology

## 2017-11-21 ENCOUNTER — Encounter: Payer: Self-pay | Admitting: Neurology

## 2017-11-21 VITALS — BP 125/54 | HR 67 | Ht 60.0 in | Wt 173.0 lb

## 2017-11-21 DIAGNOSIS — I639 Cerebral infarction, unspecified: Secondary | ICD-10-CM | POA: Diagnosis not present

## 2017-11-21 DIAGNOSIS — Z9981 Dependence on supplemental oxygen: Secondary | ICD-10-CM | POA: Diagnosis not present

## 2017-11-21 DIAGNOSIS — G3184 Mild cognitive impairment, so stated: Secondary | ICD-10-CM | POA: Diagnosis not present

## 2017-11-21 DIAGNOSIS — G4733 Obstructive sleep apnea (adult) (pediatric): Secondary | ICD-10-CM | POA: Diagnosis not present

## 2017-11-21 NOTE — Progress Notes (Signed)
SLEEP MEDICINE CLINIC   Provider:  Larey Seat, M D  Primary Care Physician:  Shon Baton, MD   Referring Provider: Shon Baton, MD    Chief Complaint  Patient presents with  . Follow-up    pt with son, rm 31. pt presents to follow up from starting her new cpap machine. patient states working well with exception of needing a better face mask. someone is coming out friday to fit for a new mask.     HPI:  11-21-2017, I have the pleasure of seeing Linda Oliver today a 84 year old female in the presence of a family member, Linda Oliver, the patient underwent a polysomnography by home sleep test on apnea link on 10 July 2017.  Previous sleep studies had confirmed the presence of mild obstructive sleep apnea which was confirmed in the home sleep test.  She had an AHI of 13.4, no significant RDI increase over the AHI.  Lowest oxygen saturation was 82% but she stayed with low oxygen for 1 hour and 37 minutes.  Her average heart rate varied between 48 bpm and 90 bpm, given the quite significant oxygen desaturation in the setting of a mild apnea I recommended CPAP therapy.  She return for a titration to CPAP and possible oxygen CPAP was initiated at 5 cm water and at a pressure of 8 cmH2O there was a reduction of her apnea index to 0.3/h with 99.7% sleep efficiency and complete correction of hypoxemia with 2 liters additional need of oxygen.   She used a small sized nasal mask and air leaks were minimal.  There were a few periodic limb movements and her EKG showed some erratic beats , but no sustained arrhythmia.  After the patient started on CPAP. Sleep quality has improved.  She reports air leaks at home.   Compliance_the patient has been a 90% compliant patient she has used the machine 27 out of the last 30 days, with an average use at time of 6 hours and 27 minutes nightly she is using an AutoSet between 6 and a maximum pressure of 13 cmH2O with 3 cm expiratory pressure relief.  Her residual AHI  is excellent at only 0.5 apneas per hour she does have large all air leaks at home 95th percentile pressure is 11.6 liter min.  according to these downloads. She will ask for a pediatric XS petite nasal mask.      Linda Oliver is a 84 y.o. female , seen here as in a referral  from Linda Oliver for a sleep consultation.  I had the pleasure of seeing Linda Oliver in 2010 when she underwent a polysomnography on 1 November of that year also referred by Linda Oliver.  At the time she was 84 years old with a history of diabetes, GERD, hypertension, neuropathy, coronary artery disease, pernicious anemia and had reported excessive daytime sleepiness and fatigue as well as observed apneas and snoring.  She had a triple bypass in the 1990s, cholecystectomy hysterectomy hand surgery for carpal tunnel, shoulder surgery on both sides the last hand surgery was in February 2019.  The sleep study revealed an AHI of 10.6 only 8 clear apneas were noted and 66 hypopneas, there was no REM sleep seen, the patient was placed on CPAP at the time and there was later oxygen ordered as well.  She was set up with [facility] but she states that she could not get service from [facility] over the last 2 years and then stopped using the  machine.  The lowest oxygen saturation was 85% SPO2 was 25.7 minutes of total desaturation time, arousal index was 20.1.  The patient reports that her sleep is still very fragmented and that since she is not using CPAP or oxygen now she does feel a little bit more sleepy and fatigued again.  She would like to return to treatment.   The patient is established with Linda Oliver, see below quotation of last visit with her.  MRI brain 09/27/2016: nothing acute, remote small infarct superior left cerebellar hemisphere, moderate cerebral atrophy and mild cerebellar atrophy. Interval history 11/08/2016: Feels her memory is stable.Discussed MCI, formal neurocognitive testing. Discussed clinical trials including  [employer]. She is already on Aricept and Namenda. Feels her memory is stable. Here with her son-in-law who also provides information. Provided information all of the above. I also reviewed MRI of the brain and cervical spine. 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 She had some degenerative changes in the neck but no myelopathy or cervical stenosis. EMG nerve conduction study showed peripheral neuropathy, bilateral carpal tunnel syndrome but no etiology for her proximal weakness such as myopathy myositis or neuromuscular disorder.   Chief complaint according to patient :   Sleep habits are as follows: watches TV in the den, checks BP and takes her medication,    Her bed time is at 11 Pm, wakes up at 3 , 6 Am and rises at 8 AM. Sleep on either side, on multiple pillows. Adjustable bed user. One nocturia at 6 AM, the other time spontaneous arousals. The bedroom is cool, quiet and dark. Sometimes she watches Tv in bed, 1 day a week on averagae. She averages 6 hours or less of sleep. She does feel refreshed after waking up but only temporarily. By lunch time she is exhausted, falls easily asleep. She may nap for 20 minutes. She reports vivid dreams. She talks in her sleep. Her husband is in a nursing home- she speaks with him in her sleep. Her husband has a bad heart, lungs and urostomy.    Sleep medical history and family sleep history:  MCI , see above listed medical condition.   Social history: married for 65 years , cannot drive anymore, non smoker, non drinker, caffeine in form of tea and coke.  1 daughter, 2 grandchildren.   Review of Systems: Out of a complete 14 system review, the patient complains of only the following symptoms, and all other reviewed systems are negative.  The patient endorsed fatigue, history of a detached retina was blurred  vision easy bruising increased thirst, snoring joint pain incontinence allergies runny nose and skin sensitivity hearing loss anxiety depression decreased energy-interest in some activities some memory loss and insomnia as well as restless legs.  She endorsed the Epworth Sleepiness Scale 9/ 24 points, and the fatigue severity score of 59 points much higher.  The geriatric depression score was endorsed positive at 7 out of 15 points which is indicative for the presence of a mild depression.   Social History   Socioeconomic History  . Marital status: Married    Spouse name: Not on file  . Number of children: 1  . Years of education: 47  . Highest education level: Not on file  Occupational History  . Not on file  Social Needs  . Financial resource strain: Not on file  .  Food insecurity:    Worry: Not on file    Inability: Not on file  . Transportation needs:    Medical: Not on file    Non-medical: Not on file  Tobacco Use  . Smoking status: Former Smoker    Packs/day: 1.00    Years: 30.00    Pack years: 30.00    Types: Cigarettes    Last attempt to quit: 11/25/1986    Years since quitting: 31.0  . Smokeless tobacco: Never Used  Substance and Sexual Activity  . Alcohol use: No  . Drug use: No  . Sexual activity: Yes    Birth control/protection: Post-menopausal  Lifestyle  . Physical activity:    Days per week: Not on file    Minutes per session: Not on file  . Stress: Not on file  Relationships  . Social connections:    Talks on phone: Not on file    Gets together: Not on file    Attends religious service: Not on file    Active member of club or organization: Not on file    Attends meetings of clubs or organizations: Not on file    Relationship status: Not on file  . Intimate partner violence:    Fear of current or ex partner: Not on file    Emotionally abused: Not on file    Physically abused: Not on file    Forced sexual activity: Not on file  Other Topics Concern    . Not on file  Social History Narrative   Married, has 1 child   Caffeine use: none    Family History  Problem Relation Age of Onset  . Coronary artery disease Mother   . Heart disease Father   . Cancer Father        skin  . Cancer Brother   . Cancer - Colon Brother        brain, Lymphoma    Past Medical History:  Diagnosis Date  . Anginal pain ([place])    occ  . Anxiety   . Broken ankle   . Bullous pemphigus   . CAD (coronary artery disease)    [place]   80% stenosis ostial Left main, 30% stenosis proximal LAD, 80% stenosis proximal RCA CABG  -  LIMA to LAD, SVG to int, SVG to [place], [place]   . Depression   . Diabetic peripheral neuropathy ([place])   . GERD (gastroesophageal reflux disease)   . Headache(784.0)    hx migraines  . Heart murmur    mvp  . Hyperlipidemia   . Hypertension   . Hypertensive heart disease   . Nasal bleeding    occ due to O2  . OA (osteoarthritis)   . Obesity (BMI 30-39.9)   . Osteopenia   . Peripheral neuropathy   . Seizure ([place])    2009  . Sleep apnea    dx 19yrs ago doesnt use cpap but uses O2 at nite 2L  . Stones in the urinary tract   . Type 2 diabetes mellitus with vascular disease ([place])   . UTI (lower urinary tract infection)     Past Surgical History:  Procedure Laterality Date  . ABDOMINAL HYSTERECTOMY  70's  . CARDIAC CATHETERIZATION    . CARPAL TUNNEL RELEASE Bilateral   . CHOLECYSTECTOMY    . CORONARY ARTERY BYPASS GRAFT  96  . SHOULDER ARTHROSCOPY     rt  . SHOULDER ARTHROSCOPY WITH ROTATOR CUFF REPAIR AND  SUBACROMIAL DECOMPRESSION  12/01/2011   Procedure: SHOULDER ARTHROSCOPY WITH ROTATOR CUFF REPAIR AND SUBACROMIAL DECOMPRESSION;  Surgeon: Nita Sells, MD;  Location: [facility];  Service: Orthopedics;  Laterality: Left;  shoulder debridement calcific tendonitis    Current Outpatient Medications  Medication Sig Dispense Refill  . acetaminophen (TYLENOL) 500 MG tablet Take 500 mg by  mouth every 4 (four) hours as needed for moderate pain.    Marland Kitchen aspirin 325 MG tablet Take 325 mg by mouth daily.    . busPIRone (BUSPAR) 7.5 MG tablet Take 7.5 mg by mouth 2 (two) times daily.    . cyanocobalamin 1000 MCG tablet Take 1,000 mcg by mouth daily.    . DULoxetine (CYMBALTA) 60 MG capsule Take 60 mg by mouth daily.    . fesoterodine (TOVIAZ) 8 MG TB24 tablet Take 8 mg by mouth daily.    . fluticasone (FLONASE) 50 MCG/ACT nasal spray Place 1 spray into both nostrils 2 (two) times daily as needed for allergies or rhinitis.    Marland Kitchen gabapentin (NEURONTIN) 300 MG capsule Take 300 mg by mouth 2 (two) times daily.    Marland Kitchen glimepiride (AMARYL) 1 MG tablet Take 1 mg by mouth daily.    . hydrOXYzine (ATARAX/VISTARIL) 25 MG tablet Take 25 mg by mouth 2 (two) times daily.    . Insulin Glargine (LANTUS [place]) Inject 40 Units into the skin daily.    . metoprolol succinate (TOPROL-XL) 25 MG 24 hr tablet Take 12.5 mg by mouth 2 (two) times daily.    Marland Kitchen NAMZARIC 28-10 MG CP24 TAKE 1 CAPSULE DAILY 90 capsule 3  . pantoprazole (PROTONIX) 40 MG tablet Take 40 mg by mouth daily.    . rosuvastatin (CRESTOR) 10 MG tablet Take 10 mg by mouth daily.    . sitaGLIPtin (JANUVIA) 100 MG tablet Take 100 mg by mouth daily.    . valsartan-hydrochlorothiazide (DIOVAN-HCT) 160-25 MG tablet Take 0.5 tablets by mouth daily.     No current facility-administered medications for this visit.     Allergies as of 11/21/2017 - Review Complete 11/21/2017  Allergen Reaction Noted  . Percocet [oxycodone-acetaminophen] Nausea And Vomiting 09/30/2011  . Latex Itching and Rash 11/22/2011    Vitals: BP (!) 125/54   Pulse 67   Ht 5' (1.524 m)   Wt 173 lb (78.5 kg)   BMI 33.79 kg/m  Last Weight:  Wt Readings from Last 1 Encounters:  11/21/17 173 lb (78.5 kg)   GYF:VCBS mass index is 33.79 kg/m.     Last Height:   Ht Readings from Last 1 Encounters:  11/21/17 5' (1.524 m)    Physical exam:  General: The patient is awake,  alert and appears not in acute distress. The patient is well groomed. Head: Normocephalic, atraumatic. Neck is supple. Mallampati 2,  neck circumference:14. Nasal airflow patent , TMJ clicking  is evident on the left . Retrognathia is not seen. Partial dentures.  Cardiovascular:  irregular rate and rhythm, without murmurs or carotid bruit, and without distended neck veins. Respiratory: Lungs are clear to auscultation. Skin:  Without evidence of edema, or rash Trunk: BMI is 34. The patient's posture is erect    Neurologic exam : The patient is awake and alert, oriented to place and time.   Memory was not tested today, 11-21-2017 .   MOCA: Montreal Cognitive Assessment  11/04/2015 02/19/2014  Visuospatial/ Executive (0/5) 4 3  Naming (0/3) 3 3  Attention: Read list of digits (0/2) 2 2  Attention: Read list  of letters (0/1) 1 1  Attention: Serial 7 subtraction starting at 100 (0/3) 0 2  Language: Repeat phrase (0/2) 1 1  Language : Fluency (0/1) 0 0  Abstraction (0/2) 1 2  Delayed Recall (0/5) 2 4  Orientation (0/6) 6 6  Total 20 24  Adjusted Score (based on education) 21 25   MMSE: MMSE - Mini Mental State Exam 11/16/2017 11/08/2016  Orientation to time 5 5  Orientation to Place 5 5  Registration 3 3  Attention/ Calculation 3 5  Recall 2 2  Language- name 2 objects 2 2  Language- repeat 1 1  Language- follow 3 step command 3 3  Language- read & follow direction 1 1  Write a sentence 1 1  Copy design 1 1  Total score 27 29      Attention span & concentration ability appears normal.  Speech is fluent, without dysarthria, dysphonia or aphasia.  Mood and affect are appropriate. She is talkative and pleasant, cooperative.  Cranial nerves: Pupils are equal and briskly reactive to light. Funduscopic exam without  evidence of pallor or edema. Extraocular movements  in vertical and horizontal planes intact and without nystagmus. Visual fields by finger perimetry are  intact. Hearing to finger rub intact.  Facial sensation intact to fine touch. Facial motor strength is symmetric and tongue and uvula move midline. Shoulder shrug was symmetrical.   Motor exam:  Deferred testing for strength in all extremities. Sensory:  Fine touch, pinprick and vibration were  normal. Coordination: Rapid alternating movements in the fingers/hands was normal. Finger-to-nose maneuver normal without evidence of ataxia, dysmetria or tremor. Gait and station: Patient walks with a cane as assistive device and turns with 4 Steps to the left, and with 5 to the right.  She reports again er tendency to fall.   Deep tendon reflexes: symmetric and intact.   Assessment:  , my assessment is   1) OSA is still present, mild , and she has need for oxygen at night - hypoxemia was more severe than the apnea. - not surprising given the report of the patients increased fatigue since she has been of CPAP and oxygen.   The patient was advised of the nature of the diagnosed disorder , the treatment options and the  risks for general health and wellness arising from not treating the condition.   I spent more than 15 minutes of face to face time with the patient.  Greater than 50% of time was spent in counseling and coordination of care. We have discussed the diagnosis and differential and I answered the patient's questions.    Plan:  Treatment plan and additional workup : Continue CPAP auto titration use.  Air leaks- She will ask for a pediatric XS petite nasal mask.   Larey Seat, MD 26/37/8588, 5:02 PM  Certified in Neurology by ABPN Certified in [place] by [facility] [address], [place] [employer] [place], [place] [address]

## 2017-11-24 ENCOUNTER — Telehealth: Payer: Self-pay | Admitting: Psychology

## 2017-12-28 ENCOUNTER — Other Ambulatory Visit: Payer: Self-pay | Admitting: Neurology

## 2018-02-15 ENCOUNTER — Ambulatory Visit (INDEPENDENT_AMBULATORY_CARE_PROVIDER_SITE_OTHER): Payer: Medicare Other | Admitting: Cardiology

## 2018-02-15 ENCOUNTER — Encounter: Payer: Self-pay | Admitting: Cardiology

## 2018-02-15 DIAGNOSIS — I6523 Occlusion and stenosis of bilateral carotid arteries: Secondary | ICD-10-CM

## 2018-02-15 DIAGNOSIS — I951 Orthostatic hypotension: Secondary | ICD-10-CM | POA: Diagnosis not present

## 2018-02-15 DIAGNOSIS — Z951 Presence of aortocoronary bypass graft: Secondary | ICD-10-CM | POA: Diagnosis not present

## 2018-02-15 DIAGNOSIS — I2510 Atherosclerotic heart disease of native coronary artery without angina pectoris: Secondary | ICD-10-CM | POA: Diagnosis not present

## 2018-02-15 NOTE — Progress Notes (Signed)
Subjective:   @Patient  ID@: Linda Oliver, female    DOB: 1932-06-24, 85 y.o.   MRN: [id number]  Shon Baton, MD:  No chief complaint on file.   HPI   Linda Oliver is a Caucasian female with CAD, HTN, hyperlipidemia, diabetes with PVD and sleep apnea on CPAP but not compliant who presents for follow-up of dizziness and near syncope. She has a history of autonomic orthostatic hypotension.  She was last seen by [place] 6 months ago. Had improvement in near syncope with being compliant with support stockings. No recent near syncope. Does have dizziness with sudden position changes that is stable. No specific complaints today. Tolerating all medications well.   Past Medical History:  Diagnosis Date  . Anginal pain ([place])    occ  . Anxiety   . Broken ankle   . Bullous pemphigus   . CAD (coronary artery disease)    [place]   80% stenosis ostial Left main, 30% stenosis proximal LAD, 80% stenosis proximal RCA CABG  -  LIMA to LAD, SVG to int, SVG to [place], [place]   . Depression   . Diabetic peripheral neuropathy ([place])   . GERD (gastroesophageal reflux disease)   . Headache(784.0)    hx migraines  . Heart murmur    mvp  . Hyperlipidemia   . Hypertension   . Hypertensive heart disease   . Nasal bleeding    occ due to O2  . OA (osteoarthritis)   . Obesity (BMI 30-39.9)   . Osteopenia   . Peripheral neuropathy   . Seizure ([place])    2009  . Sleep apnea    dx 60yrs ago doesnt use cpap but uses O2 at nite 2L  . Stones in the urinary tract   . Type 2 diabetes mellitus with vascular disease ([place])   . UTI (lower urinary tract infection)     Past Surgical History:  Procedure Laterality Date  . ABDOMINAL HYSTERECTOMY  70's  . CARDIAC CATHETERIZATION    . CARPAL TUNNEL RELEASE Bilateral   . CHOLECYSTECTOMY    . CORONARY ARTERY BYPASS GRAFT  96  . SHOULDER ARTHROSCOPY     rt  . SHOULDER ARTHROSCOPY WITH ROTATOR CUFF REPAIR AND SUBACROMIAL DECOMPRESSION   12/01/2011   Procedure: SHOULDER ARTHROSCOPY WITH ROTATOR CUFF REPAIR AND SUBACROMIAL DECOMPRESSION;  Surgeon: Nita Sells, MD;  Location: [facility];  Service: Orthopedics;  Laterality: Left;  shoulder debridement calcific tendonitis    Family History  Problem Relation Age of Onset  . Coronary artery disease Mother   . Heart disease Father   . Cancer Father        skin  . Cancer Brother   . Cancer - Colon Brother        brain, Lymphoma    Social History   Socioeconomic History  . Marital status: Married    Spouse name: Not on file  . Number of children: 1  . Years of education: 17  . Highest education level: Not on file  Occupational History  . Not on file  Social Needs  . Financial resource strain: Not on file  . Food insecurity:    Worry: Not on file    Inability: Not on file  . Transportation needs:    Medical: Not on file    Non-medical: Not on file  Tobacco Use  . Smoking status: Former Smoker    Packs/day: 1.00    Years: 30.00  Pack years: 30.00    Types: Cigarettes    Last attempt to quit: 11/25/1986    Years since quitting: 31.2  . Smokeless tobacco: Never Used  Substance and Sexual Activity  . Alcohol use: No  . Drug use: No  . Sexual activity: Yes    Birth control/protection: Post-menopausal  Lifestyle  . Physical activity:    Days per week: Not on file    Minutes per session: Not on file  . Stress: Not on file  Relationships  . Social connections:    Talks on phone: Not on file    Gets together: Not on file    Attends religious service: Not on file    Active member of club or organization: Not on file    Attends meetings of clubs or organizations: Not on file    Relationship status: Not on file  . Intimate partner violence:    Fear of current or ex partner: Not on file    Emotionally abused: Not on file    Physically abused: Not on file    Forced sexual activity: Not on file  Other Topics Concern  . Not on file  Social History  Narrative   Married, has 1 child   Caffeine use: none    Current Meds  Medication Sig  . acetaminophen (TYLENOL) 500 MG tablet Take 500 mg by mouth every 4 (four) hours as needed for moderate pain.  Marland Kitchen aspirin 325 MG tablet Take 325 mg by mouth daily.  . busPIRone (BUSPAR) 7.5 MG tablet Take 7.5 mg by mouth 2 (two) times daily.  . cyanocobalamin 1000 MCG tablet Take 1,000 mcg by mouth daily.  . DULoxetine (CYMBALTA) 60 MG capsule Take 60 mg by mouth daily.  . fesoterodine (TOVIAZ) 8 MG TB24 tablet Take 8 mg by mouth daily.  . fluticasone (FLONASE) 50 MCG/ACT nasal spray Place 1 spray into both nostrils 2 (two) times daily as needed for allergies or rhinitis.  Marland Kitchen gabapentin (NEURONTIN) 300 MG capsule Take 300 mg by mouth 2 (two) times daily.  Marland Kitchen glimepiride (AMARYL) 1 MG tablet Take 1 mg by mouth daily.  . hydrOXYzine (ATARAX/VISTARIL) 25 MG tablet Take 25 mg by mouth 2 (two) times daily.  . Insulin Glargine (LANTUS [place]) Inject 40 Units into the skin daily.  Marland Kitchen LANTUS SOLOSTAR 100 UNIT/ML Solostar Pen   . metoprolol succinate (TOPROL-XL) 25 MG 24 hr tablet Take 12.5 mg by mouth 2 (two) times daily.  Marland Kitchen NAMZARIC 28-10 MG CP24 TAKE 1 CAPSULE DAILY  . pantoprazole (PROTONIX) 40 MG tablet Take 40 mg by mouth daily.  . rosuvastatin (CRESTOR) 10 MG tablet Take 10 mg by mouth daily.  . sitaGLIPtin (JANUVIA) 100 MG tablet Take 100 mg by mouth daily.  . valsartan-hydrochlorothiazide (DIOVAN-HCT) 160-25 MG tablet Take 0.5 tablets by mouth daily.    Review of Systems  Constitution: Negative for decreased appetite, malaise/fatigue, weight gain and weight loss.  Eyes: Negative for visual disturbance.  Cardiovascular: Positive for dyspnea on exertion and leg swelling (right ankle chronic). Negative for chest pain and syncope.  Respiratory: Positive for cough (occasional) and snoring (on CPAP). Negative for hemoptysis and wheezing.   Endocrine: Negative for cold intolerance and heat intolerance.    Hematologic/Lymphatic: Does not bruise/bleed easily.  Skin: Negative for nail changes.  Musculoskeletal: Negative for myalgias.  Gastrointestinal: Negative for abdominal pain, nausea and vomiting.  Genitourinary: Positive for bladder incontinence (chronic).  Neurological: Positive for dizziness. Negative for difficulty with concentration, focal weakness and  headaches.  Psychiatric/Behavioral: Negative for altered mental status and suicidal ideas.  All other systems reviewed and are negative.      Objective:     Orthostatic VS for the past 24 hrs:  BP- Lying Pulse- Lying BP- Sitting Pulse- Sitting BP- Standing at 0 minutes Pulse- Standing at 0 minutes  02/15/18 1057 153/56 61 136/54 60 113/45 62      Echocardiogram 11/16/2016: Left ventricle cavity is normal in size. Mild concentric hypertrophy of the left ventricle. Abnormal septal motion due to post-CABG status. Elevated LAP. Grade II diastolic dysfunction Calculated EF 64%. Left atrial cavity is moderately dilated. Mild (Grade I) aortic regurgitation. Mild aortic valve leaflet calcification. Diastolic dysfunction new since 2011. Inadequate tricuspid regurgitation jet to estimate pulmonary artery pressure. Otherwise, no significant change.  Lexiscan myoview stress test 10/24/2016: 1. Resting EKG shows T inversion in high lateral leads. There was <1  mm ST depression and T inversion in inferior and lateral leads, reverted to baseline immediately into recovery. Equivocal ischemic change due to pharmacologic stress with lexiscan. Symptoms included dyspnea and dizziness.  2. Very subtle basal inferior small sized reversible defect suggestive of ischemia noted. Normal LVEF without wall motion abnormality at 72%. Low risk study.   Physical Exam  Constitutional: She is oriented to person, place, and time. Vital signs are normal. She appears well-developed and well-nourished.  HENT:  Head: Normocephalic and atraumatic.  Neck: Normal  range of motion. Carotid bruit is present.  Cardiovascular: Normal rate, regular rhythm, normal heart sounds and intact distal pulses.  Pulmonary/Chest: Effort normal and breath sounds normal. No accessory muscle usage. No respiratory distress.  Abdominal: Soft. Bowel sounds are normal.  Musculoskeletal: Normal range of motion.  Neurological: She is alert and oriented to person, place, and time.  Skin: Skin is warm and dry.  Vitals reviewed.        Assessment & Recommendations:   1. Autonomic orthostatic hypotension She has not had any recent near syncopal episodes. She is markedly orthostatic today; however, not wearing support stockings. She generally is compliant and has had improvement in symptoms with this. No changes in meds today.   2. H/O three vessel coronary artery bypass No symptoms of angina.   3. Coronary artery disease involving native coronary artery of native heart without angina pectoris No symptoms of angina. Has chronic dyspnea on exertion that is stable. Encouraged her to be more active. On appropriate medical therapy.   4. Carotid stenosis, asymptomatic, bilateral Will order carotid duplelx for surveillence to be completed in May 2020. Had slight progression on her last carotid duplex 6 months ago, but remains asymptomatic.   Will have her follow up with [place] in 6 months or sooner if needed.     Jeri Lager, FNP-C [facility], [place] Office: [phone] Fax: [phone]

## 2018-03-09 DIAGNOSIS — Z794 Long term (current) use of insulin: Secondary | ICD-10-CM | POA: Diagnosis not present

## 2018-03-09 DIAGNOSIS — Z6831 Body mass index (BMI) 31.0-31.9, adult: Secondary | ICD-10-CM | POA: Diagnosis not present

## 2018-03-09 DIAGNOSIS — D692 Other nonthrombocytopenic purpura: Secondary | ICD-10-CM | POA: Diagnosis not present

## 2018-03-09 DIAGNOSIS — E538 Deficiency of other specified B group vitamins: Secondary | ICD-10-CM | POA: Diagnosis not present

## 2018-03-09 DIAGNOSIS — R413 Other amnesia: Secondary | ICD-10-CM | POA: Diagnosis not present

## 2018-03-09 DIAGNOSIS — E668 Other obesity: Secondary | ICD-10-CM | POA: Diagnosis not present

## 2018-03-09 DIAGNOSIS — I951 Orthostatic hypotension: Secondary | ICD-10-CM | POA: Diagnosis not present

## 2018-03-09 DIAGNOSIS — I2721 Secondary pulmonary arterial hypertension: Secondary | ICD-10-CM | POA: Diagnosis not present

## 2018-03-09 DIAGNOSIS — I700 Atherosclerosis of aorta: Secondary | ICD-10-CM | POA: Diagnosis not present

## 2018-03-09 DIAGNOSIS — I119 Hypertensive heart disease without heart failure: Secondary | ICD-10-CM | POA: Diagnosis not present

## 2018-03-09 DIAGNOSIS — E1149 Type 2 diabetes mellitus with other diabetic neurological complication: Secondary | ICD-10-CM | POA: Diagnosis not present

## 2018-03-09 DIAGNOSIS — I77819 Aortic ectasia, unspecified site: Secondary | ICD-10-CM | POA: Diagnosis not present

## 2018-04-16 DIAGNOSIS — N644 Mastodynia: Secondary | ICD-10-CM | POA: Diagnosis not present

## 2018-04-16 DIAGNOSIS — I10 Essential (primary) hypertension: Secondary | ICD-10-CM | POA: Diagnosis not present

## 2018-04-16 DIAGNOSIS — N6002 Solitary cyst of left breast: Secondary | ICD-10-CM | POA: Diagnosis not present

## 2018-04-19 ENCOUNTER — Other Ambulatory Visit: Payer: Self-pay | Admitting: Internal Medicine

## 2018-04-19 DIAGNOSIS — N6002 Solitary cyst of left breast: Secondary | ICD-10-CM

## 2018-05-04 ENCOUNTER — Ambulatory Visit: Payer: Medicare Other | Admitting: Psychology

## 2018-05-17 ENCOUNTER — Other Ambulatory Visit: Payer: Medicare Other

## 2018-05-18 ENCOUNTER — Other Ambulatory Visit: Payer: Self-pay

## 2018-05-18 ENCOUNTER — Ambulatory Visit
Admission: RE | Admit: 2018-05-18 | Discharge: 2018-05-18 | Disposition: A | Discharge status: Home / Self Care | Payer: Medicare Other | Source: Ambulatory Visit | Attending: Internal Medicine | Admitting: Internal Medicine

## 2018-05-18 DIAGNOSIS — N6002 Solitary cyst of left breast: Secondary | ICD-10-CM

## 2018-06-06 ENCOUNTER — Telehealth: Payer: Self-pay | Admitting: Neurology

## 2018-06-06 NOTE — Telephone Encounter (Signed)
Called the patient. We need to push her apt up sooner with Megan hopefully or another NP for insurance compliance documentation purposes for her CPAP. This can be a video or telephone visit if patient doesn't feel comfortable coming in office. Apt must be completed before 07/24/2018 with NP or MD whoever has opening. LVM informing the patient this and advising to call back.

## 2018-06-13 DIAGNOSIS — L820 Inflamed seborrheic keratosis: Secondary | ICD-10-CM | POA: Diagnosis not present

## 2018-06-13 DIAGNOSIS — L821 Other seborrheic keratosis: Secondary | ICD-10-CM | POA: Diagnosis not present

## 2018-06-24 ENCOUNTER — Encounter: Payer: Self-pay | Admitting: Neurology

## 2018-06-25 NOTE — Telephone Encounter (Signed)
Pt would prefer a telephone visit for her visit on 06/28/2018  CB# [phone]

## 2018-06-26 ENCOUNTER — Encounter: Payer: Self-pay | Admitting: Neurology

## 2018-06-26 NOTE — Telephone Encounter (Signed)
Called the patient to review their chart and made sure that everything was up to date. Patient informed they received the e-mail/text message for the visit. Instructed to make sure they hold on to the e-mail/text for the upcoming appointment as it is necessary to access their appointment. Instructed the patient that apx 30 min prior to the appointment the front staff will contact them to make sure they are ready to go for their appointment in case there is any need for troubleshooting it can be completed prior to the appointment time. Reminded the patient once more that this is treated as a Office visit and the patient must be prepared for the visit and ready at the time of their appointment preferably in a well lit area where they have good connection for the visit. Pt verbalized understanding.   DME [facility]

## 2018-06-28 ENCOUNTER — Ambulatory Visit (INDEPENDENT_AMBULATORY_CARE_PROVIDER_SITE_OTHER): Payer: Medicare Other | Admitting: Neurology

## 2018-06-28 ENCOUNTER — Encounter: Payer: Self-pay | Admitting: Neurology

## 2018-06-28 ENCOUNTER — Other Ambulatory Visit: Payer: Self-pay

## 2018-06-28 DIAGNOSIS — E1159 Type 2 diabetes mellitus with other circulatory complications: Secondary | ICD-10-CM | POA: Diagnosis not present

## 2018-06-28 DIAGNOSIS — I6523 Occlusion and stenosis of bilateral carotid arteries: Secondary | ICD-10-CM | POA: Diagnosis not present

## 2018-06-28 DIAGNOSIS — I119 Hypertensive heart disease without heart failure: Secondary | ICD-10-CM | POA: Diagnosis not present

## 2018-06-28 NOTE — Progress Notes (Signed)
Virtual Visit via Video/ Phone Note  [place]  PCP Dr Virgina Jock, cc: Neurologist Dr Jaynee Eagles.   I connected with Linda Oliver on 06/28/18 at  1:30 PM EDT by a video enabled telemedicine application and verified that I am speaking with the correct person using two identifiers.  Location: Patient: at home  Provider:at [facility]    I discussed the limitations of evaluation and management by telemedicine and the availability of in person appointments. The patient expressed understanding and agreed to proceed.  History of Present Illness: HPI:  11-21-2017, I have the pleasure of seeing Linda Oliver today a 85 year old female in the presence of a family member, [name], the patient underwent a polysomnography by home sleep test on apnea link on 10 July 2017.  Previous sleep studies had confirmed the presence of mild obstructive sleep apnea which was confirmed in the home sleep test.  She had an AHI of 13.4, no significant RDI increase over the AHI.  Lowest oxygen saturation was 82% but she stayed with low oxygen for 1 hour and 37 minutes.  Her average heart rate varied between 48 bpm and 90 bpm, given the quite significant oxygen desaturation in the setting of a mild apnea I recommended CPAP therapy.  She return for a titration to CPAP and possible oxygen CPAP was initiated at 5 cm water and at a pressure of 8 cmH2O there was a reduction of her apnea index to 0.3/h with 99.7% sleep efficiency and complete correction of hypoxemia with 2 liters additional need of oxygen.   She used a small sized nasal mask and air leaks were minimal.  There were a few periodic limb movements and her EKG showed some erratic beats , but no sustained arrhythmia.  After the patient started on CPAP. Sleep quality has improved.     Observations/Objective: CPAP compliance is good, but used to be better. She reads and watching TV in bed.  Dreamless sleep.  No memory loss reported- neither by son in law.  Since this is a  phone conversation I have essentially no observational visual connection to the patient.  I have noticed that her voice is hoarse.  She appears alert oriented her speech is fluent and she answers questions without hesitation.  Her compliance report shows 80% compliance by hours and days for a 30-day.  Ending on 24 June 2018.  Average user time is 6 hours 44 minutes, she is using an AutoSet air sense 10 with a minimum pressure of 6 cmH2O and a maximum pressure of 13 cmH2O and 3 cm EPR.  Her 95th percentile pressure is 12 cmH2O and her 95th percentile air leak is 23.9 L/min.   However her air leak as well as her pressure needs are peaking once or twice at night for a brief period of time and then resume normal and in conversation I learned that she does not switch the CPAP machine off when she goes to the bathroom she just removes the headgear.   This would create the data of an air leak until the machine switches itself off.  This also explains why she does not have higher degree of apneas her current AHI is 0.5 and she herself has not noticed any air leakage with her current interface.     Assessment and Plan: We will follow-up once a year for CPAP compliance the patient agreed with this approach.  We will see her next year in May or June and the next visit will  be with the nurse practitioner.  Follow Up Instructions: AVS will be printed and sent to patient. RV in 12 month.     I discussed the assessment and treatment plan with the patient. The patient was provided an opportunity to ask questions and all were answered. The patient agreed with the plan and demonstrated an understanding of the instructions.   The patient was advised to call back or seek an in-person evaluation if the symptoms worsen or if the condition fails to improve as anticipated.  I provided 12 minutes of record review and non-face-to-face time during this encounter.   Larey Seat, MD

## 2018-06-28 NOTE — Patient Instructions (Signed)
We will follow-up once a year for CPAP compliance the patient agreed with this approach.  We will see her next year in May or June and the next visit will be with the nurse practitioner.  Follow Up Instructions: AVS will be printed and sent to patient. RV in 12 month.    CPAP and BPAP Information CPAP and BPAP are methods of helping a person breathe with the use of air pressure. CPAP stands for "continuous positive airway pressure." BPAP stands for "bi-level positive airway pressure." In both methods, air is blown through your nose or mouth and into your air passages to help you breathe well. CPAP and BPAP use different amounts of pressure to blow air. With CPAP, the amount of pressure stays the same while you breathe in and out. With BPAP, the amount of pressure is increased when you breathe in (inhale) so that you can take larger breaths. Your health care provider will recommend whether CPAP or BPAP would be more helpful for you. Why are CPAP and BPAP treatments used? CPAP or BPAP can be helpful if you have:  Sleep apnea.  Chronic obstructive pulmonary disease (COPD).  Heart failure.  Medical conditions that weaken the muscles of the chest including muscular dystrophy, or neurological diseases such as amyotrophic lateral sclerosis (ALS).  Other problems that cause breathing to be weak, abnormal, or difficult. CPAP is most commonly used for obstructive sleep apnea (OSA) to keep the airways from collapsing when the muscles relax during sleep. How is CPAP or BPAP administered? Both CPAP and BPAP are provided by a small machine with a flexible plastic tube that attaches to a plastic mask. You wear the mask. Air is blown through the mask into your nose or mouth. The amount of pressure that is used to blow the air can be adjusted on the machine. Your health care provider will determine the pressure setting that should be used based on your individual needs. When should CPAP or BPAP be used? In  most cases, the mask only needs to be worn during sleep. Generally, the mask needs to be worn throughout the night and during any daytime naps. People with certain medical conditions may also need to wear the mask at other times when they are awake. Follow instructions from your health care provider about when to use the machine. What are some tips for using the mask?   Because the mask needs to be snug, some people feel trapped or closed-in (claustrophobic) when first using the mask. If you feel this way, you may need to get used to the mask. One way to do this is by holding the mask loosely over your nose or mouth and then gradually applying the mask more snugly. You can also gradually increase the amount of time that you use the mask.  Masks are available in various types and sizes. Some fit over your mouth and nose while others fit over just your nose. If your mask does not fit well, talk with your health care provider about getting a different one.  If you are using a mask that fits over your nose and you tend to breathe through your mouth, a chin strap may be applied to help keep your mouth closed.  The CPAP and BPAP machines have alarms that may sound if the mask comes off or develops a leak.  If you have trouble with the mask, it is very important that you talk with your health care provider about finding a way to make  the mask easier to tolerate. Do not stop using the mask. Stopping the use of the mask could have a negative impact on your health. What are some tips for using the machine?  Place your CPAP or BPAP machine on a secure table or stand near an electrical outlet.  Know where the on/off switch is located on the machine.  Follow instructions from your health care provider about how to set the pressure on your machine and when you should use it.  Do not eat or drink while the CPAP or BPAP machine is on. Food or fluids could get pushed into your lungs by the pressure of the CPAP  or BPAP.  Do not smoke. Tobacco smoke residue can damage the machine.  For home use, CPAP and BPAP machines can be rented or purchased through home health care companies. Many different brands of machines are available. Renting a machine before purchasing may help you find out which particular machine works well for you.  Keep the CPAP or BPAP machine and attachments clean. Ask your health care provider for specific instructions. Get help right away if:  You have redness or open areas around your nose or mouth where the mask fits.  You have trouble using the CPAP or BPAP machine.  You cannot tolerate wearing the CPAP or BPAP mask.  You have pain, discomfort, and bloating in your abdomen. Summary  CPAP and BPAP are methods of helping a person breathe with the use of air pressure.  Both CPAP and BPAP are provided by a small machine with a flexible plastic tube that attaches to a plastic mask.  If you have trouble with the mask, it is very important that you talk with your health care provider about finding a way to make the mask easier to tolerate. This information is not intended to replace advice given to you by your health care provider. Make sure you discuss any questions you have with your health care provider. Document Released: 09/25/2003 Document Revised: 08/29/2017 Document Reviewed: 11/16/2015 Elsevier Interactive Patient Education  2019 [employer].

## 2018-07-16 ENCOUNTER — Other Ambulatory Visit: Payer: Self-pay

## 2018-07-16 MED ORDER — VALSARTAN-HYDROCHLOROTHIAZIDE 160-25 MG PO TABS: 0.5000 | ORAL_TABLET | Freq: Every day | ORAL | 1 refills | Status: DC

## 2018-07-27 ENCOUNTER — Encounter: Payer: Medicare Other | Attending: Psychology | Admitting: Psychology

## 2018-07-31 DIAGNOSIS — H35373 Puckering of macula, bilateral: Secondary | ICD-10-CM | POA: Diagnosis not present

## 2018-07-31 DIAGNOSIS — Z9889 Other specified postprocedural states: Secondary | ICD-10-CM | POA: Diagnosis not present

## 2018-07-31 DIAGNOSIS — Z961 Presence of intraocular lens: Secondary | ICD-10-CM | POA: Diagnosis not present

## 2018-07-31 DIAGNOSIS — H04123 Dry eye syndrome of bilateral lacrimal glands: Secondary | ICD-10-CM | POA: Diagnosis not present

## 2018-08-13 DIAGNOSIS — Z794 Long term (current) use of insulin: Secondary | ICD-10-CM | POA: Diagnosis not present

## 2018-08-13 DIAGNOSIS — N6002 Solitary cyst of left breast: Secondary | ICD-10-CM | POA: Diagnosis not present

## 2018-08-13 DIAGNOSIS — G4733 Obstructive sleep apnea (adult) (pediatric): Secondary | ICD-10-CM | POA: Diagnosis not present

## 2018-08-13 DIAGNOSIS — I2510 Atherosclerotic heart disease of native coronary artery without angina pectoris: Secondary | ICD-10-CM | POA: Diagnosis not present

## 2018-08-13 DIAGNOSIS — I119 Hypertensive heart disease without heart failure: Secondary | ICD-10-CM | POA: Diagnosis not present

## 2018-08-13 DIAGNOSIS — E1149 Type 2 diabetes mellitus with other diabetic neurological complication: Secondary | ICD-10-CM | POA: Diagnosis not present

## 2018-08-13 DIAGNOSIS — E785 Hyperlipidemia, unspecified: Secondary | ICD-10-CM | POA: Diagnosis not present

## 2018-08-13 DIAGNOSIS — E538 Deficiency of other specified B group vitamins: Secondary | ICD-10-CM | POA: Diagnosis not present

## 2018-08-13 DIAGNOSIS — R413 Other amnesia: Secondary | ICD-10-CM | POA: Diagnosis not present

## 2018-08-16 ENCOUNTER — Ambulatory Visit (INDEPENDENT_AMBULATORY_CARE_PROVIDER_SITE_OTHER): Payer: Medicare Other | Admitting: Cardiology

## 2018-08-16 ENCOUNTER — Other Ambulatory Visit: Payer: Self-pay

## 2018-08-16 ENCOUNTER — Encounter: Payer: Self-pay | Admitting: Cardiology

## 2018-08-16 VITALS — BP 122/88 | HR 61 | Ht 60.0 in | Wt 173.0 lb

## 2018-08-16 DIAGNOSIS — I6523 Occlusion and stenosis of bilateral carotid arteries: Secondary | ICD-10-CM

## 2018-08-16 DIAGNOSIS — I2510 Atherosclerotic heart disease of native coronary artery without angina pectoris: Secondary | ICD-10-CM | POA: Diagnosis not present

## 2018-08-16 DIAGNOSIS — I951 Orthostatic hypotension: Secondary | ICD-10-CM

## 2018-08-16 DIAGNOSIS — E7849 Other hyperlipidemia: Secondary | ICD-10-CM | POA: Diagnosis not present

## 2018-08-16 DIAGNOSIS — Z951 Presence of aortocoronary bypass graft: Secondary | ICD-10-CM

## 2018-08-16 DIAGNOSIS — E1149 Type 2 diabetes mellitus with other diabetic neurological complication: Secondary | ICD-10-CM | POA: Diagnosis not present

## 2018-08-16 DIAGNOSIS — E538 Deficiency of other specified B group vitamins: Secondary | ICD-10-CM | POA: Diagnosis not present

## 2018-08-16 NOTE — Progress Notes (Signed)
Primary Physician:  Shon Baton, MD   Patient ID: Linda Oliver, female    DOB: July 16, 1932, 85 y.o.   MRN: [id number]  Subjective:    Chief Complaint  Patient presents with  . Coronary Artery Disease  . Follow-up    43mo   This visit type was conducted due to national recommendations for restrictions regarding the COVID-19 Pandemic (e.g. social distancing).  This format is felt to be most appropriate for this patient at this time.  All issues noted in this document were discussed and addressed.  No physical exam was performed (except for noted visual exam findings with Telehealth visits).  The patient has consented to conduct a Telehealth visit and understands insurance will be billed.   I discussed the limitations of evaluation and management by telemedicine and the availability of in person appointments. The patient expressed understanding and agreed to proceed.  Virtual Visit via Video Note is as below  I connected with Ms. Bentivegna, on 08/16/18 at 1040 by a video enabled telemedicine application and verified that I am speaking with the correct person using two identifiers.     I have discussed with her regarding the safety during COVID Pandemic and steps and precautions including social distancing with the patient.     HPI: Linda Oliver  is a 85 y.o. female  with CAD, HTN, hyperlipidemia, diabetes with PVD and sleep apnea on CPAP but not compliant who presents for follow-up of dizziness and near syncope. She has a history of autonomic orthostatic hypotension.  She was last seen by [place] 6 months ago.  She is recently not had any episodes of near syncope.  Does admit to not being compliant with support stockings.  She is making careful position changes and feels that this helps with her symptoms.  No significant leg edema.  Denies any shortness of breath or chest discomfort.  She reports having labs performed with her PCP this morning.  States that hypertension and diabetes are well  controlled. . No specific complaints today. Tolerating all medications well.   Past Medical History:  Diagnosis Date  . Anginal pain ([employer])    occ  . Anxiety   . Broken ankle   . Bullous pemphigus   . CAD (coronary artery disease)    [place]   80% stenosis ostial Left main, 30% stenosis proximal LAD, 80% stenosis proximal RCA CABG  -  LIMA to LAD, SVG to int, SVG to [place], [place]   . Depression   . Diabetic peripheral neuropathy ([place])   . GERD (gastroesophageal reflux disease)   . Headache(784.0)    hx migraines  . Heart murmur    mvp  . Hyperlipidemia   . Hypertension   . Hypertensive heart disease   . Nasal bleeding    occ due to O2  . OA (osteoarthritis)   . Obesity (BMI 30-39.9)   . Obstructive sleep apnea on CPAP   . Osteopenia   . Peripheral neuropathy   . Seizure ([place])    2009  . Sleep apnea    dx 3yrs ago doesnt use cpap but uses O2 at nite 2L  . Stones in the urinary tract   . Type 2 diabetes mellitus with vascular disease ([place])   . UTI (lower urinary tract infection)     Past Surgical History:  Procedure Laterality Date  . ABDOMINAL HYSTERECTOMY  70's  . CARDIAC CATHETERIZATION    . CARPAL TUNNEL RELEASE Bilateral   .  CHOLECYSTECTOMY    . CORONARY ARTERY BYPASS GRAFT  96  . SHOULDER ARTHROSCOPY     rt  . SHOULDER ARTHROSCOPY WITH ROTATOR CUFF REPAIR AND SUBACROMIAL DECOMPRESSION  12/01/2011   Procedure: SHOULDER ARTHROSCOPY WITH ROTATOR CUFF REPAIR AND SUBACROMIAL DECOMPRESSION;  Surgeon: Nita Sells, MD;  Location: [facility];  Service: Orthopedics;  Laterality: Left;  shoulder debridement calcific tendonitis    Social History   Socioeconomic History  . Marital status: Widowed    Spouse name: Not on file  . Number of children: 1  . Years of education: 62  . Highest education level: Not on file  Occupational History  . Not on file  Social Needs  . Financial resource strain: Not on file  . Food insecurity     Worry: Not on file    Inability: Not on file  . Transportation needs    Medical: Not on file    Non-medical: Not on file  Tobacco Use  . Smoking status: Former Smoker    Packs/day: 1.00    Years: 30.00    Pack years: 30.00    Types: Cigarettes    Quit date: 11/25/1986    Years since quitting: 31.7  . Smokeless tobacco: Never Used  Substance and Sexual Activity  . Alcohol use: No  . Drug use: No  . Sexual activity: Yes    Birth control/protection: Post-menopausal  Lifestyle  . Physical activity    Days per week: Not on file    Minutes per session: Not on file  . Stress: Not on file  Relationships  . Social Herbalist on phone: Not on file    Gets together: Not on file    Attends religious service: Not on file    Active member of club or organization: Not on file    Attends meetings of clubs or organizations: Not on file    Relationship status: Not on file  . Intimate partner violence    Fear of current or ex partner: Not on file    Emotionally abused: Not on file    Physically abused: Not on file    Forced sexual activity: Not on file  Other Topics Concern  . Not on file  Social History Narrative   Married, has 1 child   Caffeine use: none    Review of Systems  Constitution: Negative for decreased appetite, malaise/fatigue, weight gain and weight loss.  Eyes: Negative for visual disturbance.  Cardiovascular: Positive for dyspnea on exertion and leg swelling (right ankle chronic). Negative for chest pain and syncope.  Respiratory: Positive for cough (occasional) and snoring (on CPAP). Negative for hemoptysis and wheezing.   Endocrine: Negative for cold intolerance and heat intolerance.  Hematologic/Lymphatic: Does not bruise/bleed easily.  Skin: Negative for nail changes.  Musculoskeletal: Negative for myalgias.  Gastrointestinal: Negative for abdominal pain, nausea and vomiting.  Genitourinary: Positive for bladder incontinence (chronic).  Neurological:  Positive for dizziness. Negative for difficulty with concentration, focal weakness and headaches.  Psychiatric/Behavioral: Negative for altered mental status and suicidal ideas.  All other systems reviewed and are negative.     Objective:  Blood pressure 122/88, pulse 61, height 5' (1.524 m), weight 173 lb (78.5 kg). Body mass index is 33.79 kg/m.    Physical exam not performed or limited due to virtual visit.  Patient appeared to be in no distress, Neck was supple, respiration was not labored.  Please see exam details from prior visit is as below.  Physical Exam  Constitutional: She is oriented to person, place, and time. Vital signs are normal. She appears well-developed and well-nourished.  HENT:  Head: Normocephalic and atraumatic.  Neck: Normal range of motion. Carotid bruit is present.  Cardiovascular: Normal rate, regular rhythm, normal heart sounds and intact distal pulses.  Pulmonary/Chest: Effort normal and breath sounds normal. No accessory muscle usage. No respiratory distress.  Abdominal: Soft. Bowel sounds are normal.  Musculoskeletal: Normal range of motion.  Neurological: She is alert and oriented to person, place, and time.  Skin: Skin is warm and dry.  Vitals reviewed.  Radiology: No results found.  Laboratory examination:    CMP Latest Ref Rng & Units 03/05/2017 09/21/2016 11/25/2011  Glucose 65 - 99 mg/dL 230(H) 329(H) 116(H)  BUN 6 - 20 mg/dL 16 12 11   Creatinine 0.44 - 1.00 mg/dL 0.84 0.83 0.61  Sodium 135 - 145 mmol/L 136 140 141  Potassium 3.5 - 5.1 mmol/L 3.6 4.3 3.9  Chloride 101 - 111 mmol/L 98(L) 97 104  CO2 22 - 32 mmol/L 26 28 31   Calcium 8.9 - 10.3 mg/dL 8.3(L) 9.8 9.4  Total Protein 6.0 - 8.5 g/dL - 6.8 -  Total Bilirubin 0.0 - 1.2 mg/dL - 0.4 -  Alkaline Phos 39 - 117 IU/L - 144(H) -  AST 0 - 40 IU/L - 14 -  ALT 0 - 32 IU/L - 8 -   CBC Latest Ref Rng & Units 03/05/2017 09/21/2016 11/25/2011  WBC 4.0 - 10.5 K/uL 10.1 7.0 10.3  Hemoglobin  12.0 - 15.0 g/dL 12.5 13.1 13.6  Hematocrit 36.0 - 46.0 % 38.6 41.4 41.7  Platelets 150 - 400 K/uL 246 263 288   Lipid Panel     Component Value Date/Time   CHOL  12/26/2007 0543    118        ATP III CLASSIFICATION:  <200     mg/dL   Desirable  200-239  mg/dL   Borderline High  >=240    mg/dL   High   TRIG 63 12/26/2007 0543   HDL 52 12/26/2007 0543   CHOLHDL 2.3 12/26/2007 0543   VLDL 13 12/26/2007 0543   LDLCALC  12/26/2007 0543    53        Total Cholesterol/HDL:CHD Risk Coronary Heart Disease Risk Table                     Men   Women  1/2 Average Risk   3.4   3.3   HEMOGLOBIN A1C Lab Results  Component Value Date   HGBA1C 8.7 (H) 09/21/2016   MPG 160 (H) 07/17/2009   TSH No results for input(s): TSH in the last 8760 hours.  PRN Meds:. Medications Discontinued During This Encounter  Medication Reason  . LANTUS SOLOSTAR 100 UNIT/ML Solostar Pen Duplicate   Current Meds  Medication Sig  . acetaminophen (TYLENOL) 500 MG tablet Take 500 mg by mouth every 4 (four) hours as needed for moderate pain.  Marland Kitchen amoxicillin (AMOXIL) 500 MG capsule TAKE 4 CAPSULES BY MOUTH 1 HOUR PRIOR TO DENTAL APPOINTMENT  . aspirin 325 MG tablet Take 325 mg by mouth daily.  . busPIRone (BUSPAR) 7.5 MG tablet Take 7.5 mg by mouth 2 (two) times daily.  . cyanocobalamin 1000 MCG tablet Take 1,000 mcg by mouth daily.  . DULoxetine (CYMBALTA) 60 MG capsule Take 60 mg by mouth daily.  . fesoterodine (TOVIAZ) 8 MG TB24 tablet Take 8 mg by mouth daily.  Marland Kitchen  fluticasone (FLONASE) 50 MCG/ACT nasal spray Place 1 spray into both nostrils 2 (two) times daily as needed for allergies or rhinitis.  Marland Kitchen gabapentin (NEURONTIN) 300 MG capsule Take 300 mg by mouth 2 (two) times daily.  Marland Kitchen glimepiride (AMARYL) 1 MG tablet Take 1 mg by mouth daily.  . hydrOXYzine (ATARAX/VISTARIL) 25 MG tablet Take 25 mg by mouth 2 (two) times daily.  . Insulin Glargine (LANTUS [place]) Inject 37 Units into the skin daily.   Marland Kitchen  lactobacillus acidophilus (BACID) TABS tablet Take 1 tablet by mouth daily.  . metoprolol succinate (TOPROL-XL) 25 MG 24 hr tablet Take 12.5 mg by mouth 2 (two) times daily.  Marland Kitchen NAMZARIC 28-10 MG CP24 TAKE 1 CAPSULE DAILY  . pantoprazole (PROTONIX) 40 MG tablet Take 40 mg by mouth daily.  . rosuvastatin (CRESTOR) 10 MG tablet Take 10 mg by mouth daily.  . sitaGLIPtin (JANUVIA) 100 MG tablet Take 100 mg by mouth daily.  . valsartan-hydrochlorothiazide (DIOVAN-HCT) 160-25 MG tablet Take 0.5 tablets by mouth daily.    Cardiac Studies:   Carotid artery duplex December 14, 2017: Stenosis in the right internal carotid artery (50-69%). Stenosis in the right external carotid artery (<50%). Stenosis in the left internal carotid artery (16-49%). Antegrade right vertebral artery flow. Antegrade left vertebral artery flow. Compared to the study done on 11/16/2016, right ICA stenosis severity increased from <50%. There is severe heterogeneous  plaque bilateral carotid arteries. Follow up in six months is appropriate if clinically indicated.  Echocardiogram 11/16/2016: Left ventricle cavity is normal in size. Mild concentric hypertrophy of the left ventricle. Abnormal septal motion due to post-CABG status. Elevated LAP. Grade II diastolic dysfunction Calculated EF 64%. Left atrial cavity is moderately dilated. Mild (Grade I) aortic regurgitation. Mild aortic valve leaflet calcification. Diastolic dysfunction new since 2011. Inadequate tricuspid regurgitation jet to estimate pulmonary artery pressure. Otherwise, no significant change.  Lexiscan myoview stress test 10/24/2016: 1. Resting EKG shows T inversion in high lateral leads. There was <1 mm ST depression and T inversion in inferior and lateral leads, reverted to baseline immediately into recovery. Equivocal ischemic change due to pharmacologic stress with lexiscan. Symptoms included dyspnea and dizziness.  2. Very subtle basal inferior small sized  reversible defect suggestive of ischemia noted. Normal LVEF without wall motion abnormality at 72%. Low risk study.  Cath 1996: [place]   80% stenosis ostial Left main, 30% stenosis proximal LAD, 80% stenosis proximal RCA CABG  -  LIMA to LAD, SVG to int, SVG to [place], [place]   Assessment:     ICD-10-CM   1. Autonomic orthostatic hypotension  I95.1   2. H/O three vessel coronary artery bypass  Z95.1   3. Coronary artery disease involving native coronary artery of native heart without angina pectoris  I25.10   4. Carotid stenosis, asymptomatic, bilateral  I65.23     EKG 06/12/2017: Normal sinus rhythm at rate of 68 bpm, normal axis. High lateral ST segment depression with T-wave inversion, cannot exclude ischemia. Incomplete RBBB. No significant change from 10/14/2016.   Recommendations:   Patient is presently doing well without any complaints today.  She has not had any recent episodes of near syncope or significant dizziness despite being noncompliant with her support stockings.  She is careful with position changes and feels like this is helping.  We will continue the same, but encouraged her to use support stockings if she noticed significant dizziness.  She is without symptoms of angina.  No symptoms suggestive of decompensated  heart failure.  Blood pressure is well controlled.  She had labs performed this morning with her PCP, will have copy sent to [place] for our records.  She states her diabetes is doing well.  On her last carotid duplex performed in November 2019 she had slight progression of right ICA.  She was due for repeat carotid duplex in May, but was canceled in view of pandemic.  She wishes to hold off on having this performed until October.  I am agreeable to this as she is nervous about coming into the office in view of current pandemic.  She is currently on statin and aspirin therapy.  I will see her back sometime after her carotid duplex for follow-up.  Miquel Dunn, MSN, APRN, FNP-C [facility]. [place] Office: [phone] Fax: [phone]

## 2018-08-20 NOTE — Progress Notes (Signed)
PCP labs 05/2018: Cholesterol 139, triglycerides 191, HDL 35, LDL 66.  Creatinine 1.1, EGFR 47, potassium 3.7, CMP normal.  Hemoglobin 11.5, CBC otherwise normal.  Hemoglobin A1c 8.7%.

## 2018-08-22 ENCOUNTER — Emergency Department (HOSPITAL_COMMUNITY): Payer: Medicare Other

## 2018-08-22 ENCOUNTER — Inpatient Hospital Stay (HOSPITAL_COMMUNITY)
Admission: EM | Admit: 2018-08-22 | Discharge: 2018-08-24 | DRG: 042 | Disposition: A | Discharge status: Home / Self Care | Payer: Medicare Other | Source: Ambulatory Visit | Attending: Internal Medicine | Admitting: Internal Medicine

## 2018-08-22 DIAGNOSIS — E1151 Type 2 diabetes mellitus with diabetic peripheral angiopathy without gangrene: Secondary | ICD-10-CM | POA: Diagnosis present

## 2018-08-22 DIAGNOSIS — H53461 Homonymous bilateral field defects, right side: Secondary | ICD-10-CM | POA: Diagnosis present

## 2018-08-22 DIAGNOSIS — Z794 Long term (current) use of insulin: Secondary | ICD-10-CM

## 2018-08-22 DIAGNOSIS — I2510 Atherosclerotic heart disease of native coronary artery without angina pectoris: Secondary | ICD-10-CM | POA: Diagnosis present

## 2018-08-22 DIAGNOSIS — Z885 Allergy status to narcotic agent status: Secondary | ICD-10-CM

## 2018-08-22 DIAGNOSIS — I351 Nonrheumatic aortic (valve) insufficiency: Secondary | ICD-10-CM | POA: Diagnosis not present

## 2018-08-22 DIAGNOSIS — F329 Major depressive disorder, single episode, unspecified: Secondary | ICD-10-CM | POA: Diagnosis present

## 2018-08-22 DIAGNOSIS — I6782 Cerebral ischemia: Secondary | ICD-10-CM | POA: Diagnosis not present

## 2018-08-22 DIAGNOSIS — I6523 Occlusion and stenosis of bilateral carotid arteries: Secondary | ICD-10-CM | POA: Diagnosis present

## 2018-08-22 DIAGNOSIS — Z03818 Encounter for observation for suspected exposure to other biological agents ruled out: Secondary | ICD-10-CM | POA: Diagnosis not present

## 2018-08-22 DIAGNOSIS — E1142 Type 2 diabetes mellitus with diabetic polyneuropathy: Secondary | ICD-10-CM | POA: Diagnosis present

## 2018-08-22 DIAGNOSIS — I63012 Cerebral infarction due to thrombosis of left vertebral artery: Secondary | ICD-10-CM | POA: Diagnosis not present

## 2018-08-22 DIAGNOSIS — K219 Gastro-esophageal reflux disease without esophagitis: Secondary | ICD-10-CM | POA: Diagnosis present

## 2018-08-22 DIAGNOSIS — E669 Obesity, unspecified: Secondary | ICD-10-CM | POA: Diagnosis present

## 2018-08-22 DIAGNOSIS — M8580 Other specified disorders of bone density and structure, unspecified site: Secondary | ICD-10-CM | POA: Diagnosis present

## 2018-08-22 DIAGNOSIS — H35373 Puckering of macula, bilateral: Secondary | ICD-10-CM | POA: Diagnosis not present

## 2018-08-22 DIAGNOSIS — I513 Intracardiac thrombosis, not elsewhere classified: Secondary | ICD-10-CM | POA: Diagnosis present

## 2018-08-22 DIAGNOSIS — Z9981 Dependence on supplemental oxygen: Secondary | ICD-10-CM | POA: Diagnosis not present

## 2018-08-22 DIAGNOSIS — Z7951 Long term (current) use of inhaled steroids: Secondary | ICD-10-CM

## 2018-08-22 DIAGNOSIS — I639 Cerebral infarction, unspecified: Secondary | ICD-10-CM | POA: Diagnosis not present

## 2018-08-22 DIAGNOSIS — I63432 Cerebral infarction due to embolism of left posterior cerebral artery: Secondary | ICD-10-CM | POA: Diagnosis not present

## 2018-08-22 DIAGNOSIS — G609 Hereditary and idiopathic neuropathy, unspecified: Secondary | ICD-10-CM | POA: Diagnosis present

## 2018-08-22 DIAGNOSIS — Z87891 Personal history of nicotine dependence: Secondary | ICD-10-CM

## 2018-08-22 DIAGNOSIS — H04123 Dry eye syndrome of bilateral lacrimal glands: Secondary | ICD-10-CM | POA: Diagnosis not present

## 2018-08-22 DIAGNOSIS — I6389 Other cerebral infarction: Secondary | ICD-10-CM | POA: Diagnosis not present

## 2018-08-22 DIAGNOSIS — G4733 Obstructive sleep apnea (adult) (pediatric): Secondary | ICD-10-CM | POA: Diagnosis present

## 2018-08-22 DIAGNOSIS — E079 Disorder of thyroid, unspecified: Secondary | ICD-10-CM | POA: Diagnosis present

## 2018-08-22 DIAGNOSIS — E1165 Type 2 diabetes mellitus with hyperglycemia: Secondary | ICD-10-CM | POA: Diagnosis present

## 2018-08-22 DIAGNOSIS — H35071 Retinal telangiectasis, right eye: Secondary | ICD-10-CM | POA: Diagnosis not present

## 2018-08-22 DIAGNOSIS — Z951 Presence of aortocoronary bypass graft: Secondary | ICD-10-CM | POA: Diagnosis not present

## 2018-08-22 DIAGNOSIS — E785 Hyperlipidemia, unspecified: Secondary | ICD-10-CM | POA: Diagnosis present

## 2018-08-22 DIAGNOSIS — Z20828 Contact with and (suspected) exposure to other viral communicable diseases: Secondary | ICD-10-CM | POA: Diagnosis present

## 2018-08-22 DIAGNOSIS — H5340 Unspecified visual field defects: Secondary | ICD-10-CM | POA: Diagnosis not present

## 2018-08-22 DIAGNOSIS — F0390 Unspecified dementia without behavioral disturbance: Secondary | ICD-10-CM | POA: Diagnosis present

## 2018-08-22 DIAGNOSIS — E119 Type 2 diabetes mellitus without complications: Secondary | ICD-10-CM | POA: Diagnosis not present

## 2018-08-22 DIAGNOSIS — I119 Hypertensive heart disease without heart failure: Secondary | ICD-10-CM | POA: Diagnosis present

## 2018-08-22 DIAGNOSIS — H538 Other visual disturbances: Secondary | ICD-10-CM | POA: Diagnosis not present

## 2018-08-22 DIAGNOSIS — I951 Orthostatic hypotension: Secondary | ICD-10-CM | POA: Diagnosis present

## 2018-08-22 DIAGNOSIS — E1159 Type 2 diabetes mellitus with other circulatory complications: Secondary | ICD-10-CM | POA: Diagnosis not present

## 2018-08-22 DIAGNOSIS — I4891 Unspecified atrial fibrillation: Secondary | ICD-10-CM | POA: Diagnosis present

## 2018-08-22 DIAGNOSIS — Z6833 Body mass index (BMI) 33.0-33.9, adult: Secondary | ICD-10-CM | POA: Diagnosis not present

## 2018-08-22 DIAGNOSIS — I10 Essential (primary) hypertension: Secondary | ICD-10-CM | POA: Diagnosis present

## 2018-08-22 DIAGNOSIS — Z79899 Other long term (current) drug therapy: Secondary | ICD-10-CM

## 2018-08-22 DIAGNOSIS — Z7982 Long term (current) use of aspirin: Secondary | ICD-10-CM

## 2018-08-22 DIAGNOSIS — I63232 Cerebral infarction due to unspecified occlusion or stenosis of left carotid arteries: Secondary | ICD-10-CM | POA: Diagnosis not present

## 2018-08-22 DIAGNOSIS — H35371 Puckering of macula, right eye: Secondary | ICD-10-CM | POA: Diagnosis not present

## 2018-08-22 DIAGNOSIS — Z91040 Latex allergy status: Secondary | ICD-10-CM

## 2018-08-22 DIAGNOSIS — Z8249 Family history of ischemic heart disease and other diseases of the circulatory system: Secondary | ICD-10-CM

## 2018-08-22 DIAGNOSIS — G43909 Migraine, unspecified, not intractable, without status migrainosus: Secondary | ICD-10-CM | POA: Diagnosis present

## 2018-08-22 DIAGNOSIS — E1169 Type 2 diabetes mellitus with other specified complication: Secondary | ICD-10-CM | POA: Diagnosis present

## 2018-08-22 DIAGNOSIS — H43811 Vitreous degeneration, right eye: Secondary | ICD-10-CM | POA: Diagnosis not present

## 2018-08-22 LAB — CBC
HCT: 36.9 % (ref 36.0–46.0)
HCT: 36.7 % (ref 36.0–46.0)
Hemoglobin: 11.3 g/dL — ABNORMAL LOW (ref 12.0–15.0)
Hemoglobin: 11.7 g/dL — ABNORMAL LOW (ref 12.0–15.0)
MCH: 25.8 pg — ABNORMAL LOW (ref 26.0–34.0)
MCH: 26.4 pg (ref 26.0–34.0)
MCHC: 30.6 g/dL (ref 30.0–36.0)
MCHC: 31.9 g/dL (ref 30.0–36.0)
MCV: 84.2 fL (ref 80.0–100.0)
MCV: 82.8 fL (ref 80.0–100.0)
Platelets: 242 10*3/uL (ref 150–400)
Platelets: 258 10*3/uL (ref 150–400)
RBC: 4.38 MIL/uL (ref 3.87–5.11)
RBC: 4.43 MIL/uL (ref 3.87–5.11)
RDW: 15.4 % (ref 11.5–15.5)
RDW: 15.2 % (ref 11.5–15.5)
WBC: 9 10*3/uL (ref 4.0–10.5)
WBC: 9.6 10*3/uL (ref 4.0–10.5)
nRBC: 0 % (ref 0.0–0.2)
nRBC: 0 % (ref 0.0–0.2)

## 2018-08-22 LAB — DIFFERENTIAL
Abs Immature Granulocytes: 0.03 10*3/uL (ref 0.00–0.07)
Basophils Absolute: 0.1 10*3/uL (ref 0.0–0.1)
Basophils Relative: 1 %
Eosinophils Absolute: 0.2 10*3/uL (ref 0.0–0.5)
Eosinophils Relative: 2 %
Immature Granulocytes: 0 %
Lymphocytes Relative: 27 %
Lymphs Abs: 2.5 10*3/uL (ref 0.7–4.0)
Monocytes Absolute: 0.7 10*3/uL (ref 0.1–1.0)
Monocytes Relative: 8 %
Neutro Abs: 5.6 10*3/uL (ref 1.7–7.7)
Neutrophils Relative %: 62 %

## 2018-08-22 LAB — COMPREHENSIVE METABOLIC PANEL
ALT: 13 U/L (ref 0–44)
AST: 17 U/L (ref 15–41)
Albumin: 3.2 g/dL — ABNORMAL LOW (ref 3.5–5.0)
Alkaline Phosphatase: 111 U/L (ref 38–126)
Anion gap: 10 (ref 5–15)
BUN: 21 mg/dL (ref 8–23)
CO2: 27 mmol/L (ref 22–32)
Calcium: 8.4 mg/dL — ABNORMAL LOW (ref 8.9–10.3)
Chloride: 100 mmol/L (ref 98–111)
Creatinine, Ser: 1.19 mg/dL — ABNORMAL HIGH (ref 0.44–1.00)
GFR calc Af Amer: 49 mL/min — ABNORMAL LOW (ref >60)
GFR calc non Af Amer: 42 mL/min — ABNORMAL LOW (ref >60)
Glucose, Bld: 320 mg/dL — ABNORMAL HIGH (ref 70–99)
Potassium: 3.5 mmol/L (ref 3.5–5.1)
Sodium: 137 mmol/L (ref 135–145)
Total Bilirubin: 0.5 mg/dL (ref 0.3–1.2)
Total Protein: 6.6 g/dL (ref 6.5–8.1)

## 2018-08-22 LAB — HEMOGLOBIN A1C
Hgb A1c MFr Bld: 8.7 % — ABNORMAL HIGH (ref 4.8–5.6)
Mean Plasma Glucose: 202.99 mg/dL

## 2018-08-22 LAB — PROTIME-INR
INR: 1.1 (ref 0.8–1.2)
Prothrombin Time: 13.9 seconds (ref 11.4–15.2)

## 2018-08-22 LAB — SARS CORONAVIRUS 2 (TAT 6-24 HRS): SARS Coronavirus 2: NEGATIVE

## 2018-08-22 LAB — APTT: aPTT: 27 seconds (ref 24–36)

## 2018-08-22 LAB — CREATININE, SERUM
Creatinine, Ser: 1.04 mg/dL — ABNORMAL HIGH (ref 0.44–1.00)
GFR calc Af Amer: 58 mL/min — ABNORMAL LOW (ref >60)
GFR calc non Af Amer: 50 mL/min — ABNORMAL LOW (ref >60)

## 2018-08-22 LAB — GLUCOSE, CAPILLARY: Glucose-Capillary: 216 mg/dL — ABNORMAL HIGH (ref 70–99)

## 2018-08-22 MED ORDER — SODIUM CHLORIDE 0.9 % IV SOLN
INTRAVENOUS | Status: DC
  Administered 2018-08-22 – 2018-08-23 (×2): via INTRAVENOUS

## 2018-08-22 MED ORDER — INSULIN ASPART 100 UNIT/ML ~~LOC~~ SOLN
0.0000 [IU] | Freq: Three times a day (TID) | SUBCUTANEOUS | Status: DC
  Administered 2018-08-23 (×2): 5 [IU] via SUBCUTANEOUS
  Administered 2018-08-24: 2 [IU] via SUBCUTANEOUS
  Administered 2018-08-24 (×2): 5 [IU] via SUBCUTANEOUS

## 2018-08-22 MED ORDER — SENNOSIDES-DOCUSATE SODIUM 8.6-50 MG PO TABS: 1.0000 | ORAL_TABLET | Freq: Every evening | ORAL | Status: DC | PRN

## 2018-08-22 MED ORDER — INSULIN ASPART 100 UNIT/ML ~~LOC~~ SOLN
0.0000 [IU] | Freq: Every day | SUBCUTANEOUS | Status: DC
  Administered 2018-08-22: 2 [IU] via SUBCUTANEOUS

## 2018-08-22 MED ORDER — ENOXAPARIN SODIUM 40 MG/0.4ML ~~LOC~~ SOLN
40.0000 mg | SUBCUTANEOUS | Status: DC
  Administered 2018-08-22 – 2018-08-23 (×2): 40 mg via SUBCUTANEOUS
  Filled 2018-08-22 (×2): qty 0.4

## 2018-08-22 MED ORDER — SODIUM CHLORIDE 0.9% FLUSH: 3.0000 mL | Freq: Once | INTRAVENOUS | Status: DC

## 2018-08-22 MED ORDER — STROKE: EARLY STAGES OF RECOVERY BOOK
Freq: Once | Status: AC
  Administered 2018-08-22: 23:00:00
  Filled 2018-08-22: qty 1

## 2018-08-22 NOTE — ED Notes (Signed)
Pt remains at MRI

## 2018-08-22 NOTE — H&P (Signed)
History and Physical   Linda Oliver NKN:[id number] DOB: 1932/06/19 DOA: 08/22/2018  Referring MD/NP/PA: Dr. Laverta Baltimore  PCP: Shon Baton, MD   Outpatient Specialists: Dr. Zadie Rhine, ophthalmology  Patient coming from: Home  Chief Complaint: Visual change  HPI: Linda Oliver is a 85 y.o. female with medical history significant of diabetes, hypertension, diabetic neuropathy, hyperlipidemia, coronary artery disease status post CABG, depression, GERD as well as anxiety disorder who suddenly lost vision in the right eye and right side of her vision on Monday 2 days ago.  She initially thought it was her retina.  She will went to see her ophthalmologist today who examined her and recommended she comes to the ER for stroke work-up.  Patient was worked up an MRI showed acute CVA.  She has no prior history of CVA although she has risk factors including the diabetes and hypertension.  She has no other focal weakness.  Patient has no change in her mental status.  She is fully awake and alert but has lost vision in the right visual field.  Neurology has been consulted and patient is being admitted for stroke work-up.  It appears to be subacute at this point..  ED Course: Temperature is 98.8 blood pressure 192/82 pulse 78 respiratory rate 22 and oxygen sat 93% room air.  CBC appears to be within normal.  Chemistry showed a creatinine of 1.04 and calcium 8.4.  Glucose is 320 with hemoglobin A1c of 8.7.  MRI showed acute to subacute nonhemorrhagic infarct involving the medial left occipital lobe.  Patient is being admitted for stroke work-up.  Review of Systems: As per HPI otherwise 10 point review of systems negative.    Past Medical History:  Diagnosis Date  . Anginal pain ([place])    occ  . Anxiety   . Broken ankle   . Bullous pemphigus   . CAD (coronary artery disease)    [place]   80% stenosis ostial Left main, 30% stenosis proximal LAD, 80% stenosis proximal RCA CABG  -  LIMA to LAD, SVG to  int, SVG to [place]   . Depression   . Diabetic peripheral neuropathy ([place])   . GERD (gastroesophageal reflux disease)   . Headache(784.0)    hx migraines  . Heart murmur    mvp  . Hyperlipidemia   . Hypertension   . Hypertensive heart disease   . Nasal bleeding    occ due to O2  . OA (osteoarthritis)   . Obesity (BMI 30-39.9)   . Obstructive sleep apnea on CPAP   . Osteopenia   . Peripheral neuropathy   . Seizure ([place])    2009  . Sleep apnea    dx 15yrs ago doesnt use cpap but uses O2 at nite 2L  . Stones in the urinary tract   . Type 2 diabetes mellitus with vascular disease ([place])   . UTI (lower urinary tract infection)     Past Surgical History:  Procedure Laterality Date  . ABDOMINAL HYSTERECTOMY  70's  . CARDIAC CATHETERIZATION    . CARPAL TUNNEL RELEASE Bilateral   . CHOLECYSTECTOMY    . CORONARY ARTERY BYPASS GRAFT  96  . SHOULDER ARTHROSCOPY     rt  . SHOULDER ARTHROSCOPY WITH ROTATOR CUFF REPAIR AND SUBACROMIAL DECOMPRESSION  12/01/2011   Procedure: SHOULDER ARTHROSCOPY WITH ROTATOR CUFF REPAIR AND SUBACROMIAL DECOMPRESSION;  Surgeon: Nita Sells, MD;  Location: [facility];  Service: Orthopedics;  Laterality: Left;  shoulder  debridement calcific tendonitis     reports that she quit smoking about 31 years ago. Her smoking use included cigarettes. She has a 30.00 pack-year smoking history. She has never used smokeless tobacco. She reports that she does not drink alcohol or use drugs.  Allergies  Allergen Reactions  . Percocet [Oxycodone-Acetaminophen] Nausea And Vomiting  . Latex Itching and Rash    ekg leads    Family History  Problem Relation Age of Onset  . Coronary artery disease Mother   . Heart disease Father   . Cancer Father        skin  . Cancer Brother   . Cancer - Colon Brother        brain, Lymphoma     Prior to Admission medications   Medication Sig Start Date End Date Taking? Authorizing Provider  acetaminophen  (TYLENOL) 500 MG tablet Take 500 mg by mouth every 4 (four) hours as needed for moderate pain.    [provider]  amoxicillin (AMOXIL) 500 MG capsule TAKE 4 CAPSULES BY MOUTH 1 HOUR PRIOR TO DENTAL APPOINTMENT 03/22/18   [provider]  aspirin 325 MG tablet Take 325 mg by mouth daily.    [provider]  busPIRone (BUSPAR) 7.5 MG tablet Take 7.5 mg by mouth 2 (two) times daily.    [provider]  cyanocobalamin 1000 MCG tablet Take 1,000 mcg by mouth daily.    [provider]  DULoxetine (CYMBALTA) 60 MG capsule Take 60 mg by mouth daily. 10/16/17   [provider]  fesoterodine (TOVIAZ) 8 MG TB24 tablet Take 8 mg by mouth daily.    [provider]  fluticasone (FLONASE) 50 MCG/ACT nasal spray Place 1 spray into both nostrils 2 (two) times daily as needed for allergies or rhinitis.    [provider]  gabapentin (NEURONTIN) 300 MG capsule Take 300 mg by mouth 2 (two) times daily.    [provider]  glimepiride (AMARYL) 1 MG tablet Take 1 mg by mouth daily.    [provider]  hydrOXYzine (ATARAX/VISTARIL) 25 MG tablet Take 25 mg by mouth 2 (two) times daily.    [provider]  Insulin Glargine (LANTUS [place]) Inject 37 Units into the skin daily.     [provider]  lactobacillus acidophilus (BACID) TABS tablet Take 1 tablet by mouth daily.    [provider]  metoprolol succinate (TOPROL-XL) 25 MG 24 hr tablet Take 12.5 mg by mouth 2 (two) times daily.    [provider]  [facility] 28-10 MG CP24 TAKE 1 CAPSULE DAILY 12/28/17   Melvenia Beam, MD  pantoprazole (PROTONIX) 40 MG tablet Take 40 mg by mouth daily. 10/31/17   [provider]  rosuvastatin (CRESTOR) 10 MG tablet Take 10 mg by mouth daily. 11/06/17   [provider]  sitaGLIPtin (JANUVIA) 100 MG tablet Take 100 mg by mouth daily.    [provider]  valsartan-hydrochlorothiazide  (DIOVAN-HCT) 160-25 MG tablet Take 0.5 tablets by mouth daily. 07/16/18   Adrian Prows, MD    Physical Exam: Vitals:   08/22/18 1317 08/22/18 1355 08/22/18 1621  BP: (!) 102/47  (!) 174/63  Pulse: (!) 57  64  Resp: 18  16  Temp:  98.4 F (36.9 C)   TempSrc:  Oral   SpO2: 98%  100%      Constitutional: NAD, calm, comfortable Vitals:   08/22/18 1317 08/22/18 1355 08/22/18 1621  BP: (!) 102/47  (!) 174/63  Pulse: (!) 57  64  Resp: 18  16  Temp:  98.4 F (36.9 C)   TempSrc:  Oral   SpO2: 98%  100%   Eyes: PERRL, lids and conjunctivae normal ENMT: Mucous membranes are moist. Posterior pharynx clear of any exudate or lesions.Normal dentition.  Neck: normal, supple, no masses, no thyromegaly Respiratory: clear to auscultation bilaterally, no wheezing, no crackles. Normal respiratory effort. No accessory muscle use.  Cardiovascular: Regular rate and rhythm, no murmurs / rubs / gallops. No extremity edema. 2+ pedal pulses. No carotid bruits.  Abdomen: no tenderness, no masses palpated. No hepatosplenomegaly. Bowel sounds positive.  Musculoskeletal: no clubbing / cyanosis. No joint deformity upper and lower extremities. Good ROM, no contractures. Normal muscle tone.  Skin: no rashes, lesions, ulcers. No induration Neurologic:  Sensation intact, DTR normal. Strength 5/5 in all 4.  Right hemianopsia, Psychiatric: Normal judgment and insight. Alert and oriented x 3. Normal mood.     Labs on Admission: I have personally reviewed following labs and imaging studies  CBC: Recent Labs  Lab 08/22/18 1351  WBC 9.0  NEUTROABS 5.6  HGB 11.3*  HCT 36.9  MCV 84.2  PLT 254   Basic Metabolic Panel: Recent Labs  Lab 08/22/18 1351  NA 137  K 3.5  CL 100  CO2 27  GLUCOSE 320*  BUN 21  CREATININE 1.19*  CALCIUM 8.4*   GFR: Estimated Creatinine Clearance: 33.2 mL/min (A) (by C-G formula based on SCr of 1.19 mg/dL (H)). Liver Function Tests: Recent Labs  Lab 08/22/18 1351  AST 17   ALT 13  ALKPHOS 111  BILITOT 0.5  PROT 6.6  ALBUMIN 3.2*   No results for input(s): LIPASE, AMYLASE in the last 168 hours. No results for input(s): AMMONIA in the last 168 hours. Coagulation Profile: Recent Labs  Lab 08/22/18 1351  INR 1.1   Cardiac Enzymes: No results for input(s): CKTOTAL, CKMB, CKMBINDEX, TROPONINI in the last 168 hours. BNP (last 3 results) No results for input(s): PROBNP in the last 8760 hours. HbA1C: No results for input(s): HGBA1C in the last 72 hours. CBG: No results for input(s): GLUCAP in the last 168 hours. Lipid Profile: No results for input(s): CHOL, HDL, LDLCALC, TRIG, CHOLHDL, LDLDIRECT in the last 72 hours. Thyroid Function Tests: No results for input(s): TSH, T4TOTAL, FREET4, T3FREE, THYROIDAB in the last 72 hours. Anemia Panel: No results for input(s): VITAMINB12, FOLATE, FERRITIN, TIBC, IRON, RETICCTPCT in the last 72 hours. Urine analysis:    Component Value Date/Time   COLORURINE YELLOW 03/05/2017 0250   APPEARANCEUR CLEAR 03/05/2017 0250   LABSPEC 1.018 03/05/2017 0250   PHURINE 6.0 03/05/2017 0250   GLUCOSEU 150 (A) 03/05/2017 0250   HGBUR SMALL (A) 03/05/2017 0250   BILIRUBINUR NEGATIVE 03/05/2017 0250   KETONESUR NEGATIVE 03/05/2017 0250   PROTEINUR 100 (A) 03/05/2017 0250   UROBILINOGEN 1.0 07/16/2009 1611   NITRITE NEGATIVE 03/05/2017 0250   LEUKOCYTESUR SMALL (A) 03/05/2017 0250   Sepsis Labs: @LABRCNTIP (procalcitonin:4,lacticidven:4) )No results found for this or any previous visit (from the past 240 hour(s)).   Radiological Exams on Admission: Mr Brain Wo Contrast  Result Date: 08/22/2018 CLINICAL DATA:  New onset right visual field deficit beginning 2 days ago. EXAM: MRI HEAD WITHOUT CONTRAST TECHNIQUE: Multiplanar, multiecho pulse sequences of the brain and surrounding structures were obtained without intravenous contrast. COMPARISON:  MRI brain 04/17/2013 FINDINGS: Brain: Diffusion-weighted images demonstrate a  linear 2 cm acute nonhemorrhagic infarct involving the medial left occipital lobe, likely impacting  the visual cortex. No other acute infarct is present. T2 signal changes are associated with the areas of restricted diffusion. Moderate generalized atrophy is present. Moderate periventricular and subcortical T2 changes are noted bilaterally. A remote lacunar infarct is present in the right caudate head. Remote ischemic changes are noted in the thalamus and extend into the brainstem bilaterally. Cerebellum is unremarkable. The ventricles are of normal size. No significant extraaxial fluid collection is present. There is no significant change in the atrophy or matter disease. Vascular: Flow is present in the major intracranial arteries. Skull and upper cervical spine: The craniocervical junction is normal. Upper cervical spine is within normal limits. Marrow signal is unremarkable. Sinuses/Orbits: The paranasal sinuses and mastoid air cells are clear. Bilateral lens replacements are noted. Globes and orbits are otherwise unremarkable. IMPRESSION: 1. Acute/subacute nonhemorrhagic infarct involving the medial left occipital lobe corresponds with the patient's recent visual changes. 2. Moderate atrophy and white matter disease is otherwise stable. Electronically Signed   By: San Morelle M.D.   On: 08/22/2018 18:32      Assessment/Plan Principal Problem:   Acute cerebrovascular accident (CVA) ([place]) Active Problems:   Coronary artery disease without angina pectoris   Type 2 diabetes mellitus with vascular disease ([facility])   Hyperlipidemia   Hypertensive heart disease   GERD (gastroesophageal reflux disease)   Diabetic peripheral neuropathy ([facility])   Hereditary and idiopathic peripheral neuropathy   Carotid stenosis, asymptomatic, bilateral     #1 acute CVA: Patient is having significant visual loss.  Patient will have work-up for CVA including carotid Dopplers as well as echocardiogram.  She has  history of carotid artery stenosis apparently for the last 5 years.  No intervention necessary at the time.  This may be her biggest risk factors.  Will initiate aspirin and statin.  Monitor patient's visual changes.  Still get PT with OT and speech to evaluate patient.  Stroke team to follow  #2 diabetes: Uncontrolled.  Resume her home regimen but add sliding scale insulin.  #3 hypertension: Blood pressure is slightly elevated.  Probably outside the window for permissive hypertension.  Resume home regimen and have tight blood pressure control.  #4 coronary artery disease: Stable.  No decompensation.  #5 hyperlipidemia: Continue treatment with statins.  #6 peripheral neuropathy: Patient is stable no complaints.  Continue home regimen  #7 GERD: Continue PPIs.   DVT prophylaxis: Lovenox Code Status: Full code Family Communication: No family at bedside Disposition Plan: Home Consults called: Dr. Cheral Marker, neurology Admission status: Inpatient  Severity of Illness: The appropriate patient status for this patient is INPATIENT. Inpatient status is judged to be reasonable and necessary in order to provide the required intensity of service to ensure the patient's safety. The patient's presenting symptoms, physical exam findings, and initial radiographic and laboratory data in the context of their chronic comorbidities is felt to place them at high risk for further clinical deterioration. Furthermore, it is not anticipated that the patient will be medically stable for discharge from the hospital within 2 midnights of admission. The following factors support the patient status of inpatient.   " The patient's presenting symptoms include visual changes. " The worrisome physical exam findings include right hemianopsia. " The initial radiographic and laboratory data are worrisome because of MRI which is abnormal. " The chronic co-morbidities include diabetes with hypertension.   * I certify that at  the point of admission it is my clinical judgment that the patient will require inpatient hospital care spanning beyond 2  midnights from the point of admission due to high intensity of service, high risk for further deterioration and high frequency of surveillance required.Barbette Merino MD [facility] Pager [phone]  If 7PM-7AM, please contact night-coverage [email or web address] Password [facility]  08/22/2018, 7:10 PM

## 2018-08-22 NOTE — ED Notes (Signed)
ED TO INPATIENT HANDOFF REPORT  ED Nurse Name and Phone #: Benjamine Mola [phone]  S Name/Age/Gender Linda Oliver 85 y.o. female Room/Bed: 041C/041C  Code Status   Code Status: Not on file  Home/SNF/Other Home Patient oriented to: self, place, time and situation Is this baseline? Yes   Triage Complete: Triage complete  Chief Complaint vision changes  Triage Note Pt was sent over from ophthalmology due to loss of half of her vision in right eye- pt states she had a vision change on Monday seen her eye doctor today and was sent over for stroke workup-pt has papers with MDs number for ER doc if they have questions.      Allergies Allergies  Allergen Reactions  . Percocet [Oxycodone-Acetaminophen] Nausea And Vomiting  . Latex Itching and Rash    EKG leads    Level of Care/Admitting Diagnosis ED Disposition    ED Disposition Condition [facility] Area: [place] [[id number]]  Level of Care: Telemetry Medical [104]  Covid Evaluation: Asymptomatic Screening Protocol (No Symptoms)  Diagnosis: Acute cerebrovascular accident (CVA) [facility]) [[id number]]  Admitting Physician: Elwyn Reach [[id number]]  Attending Physician: Elwyn Reach [[id number]]  Estimated length of stay: past midnight tomorrow  Certification:: I certify this patient will need inpatient services for at least 2 midnights  PT Class (Do Not Modify): Inpatient [101]  PT Acc Code (Do Not Modify): Private [1]       B Medical/Surgery History Past Medical History:  Diagnosis Date  . Anginal pain ([place])    occ  . Anxiety   . Broken ankle   . Bullous pemphigus   . CAD (coronary artery disease)    [place]   80% stenosis ostial Left main, 30% stenosis proximal LAD, 80% stenosis proximal RCA CABG  -  LIMA to LAD, SVG to int, SVG to [place], [place]   . Depression   . Diabetic peripheral neuropathy ([place])   . GERD (gastroesophageal reflux disease)   . Headache(784.0)    hx migraines  . Heart murmur    mvp  . Hyperlipidemia   . Hypertension   . Hypertensive heart disease   . Nasal bleeding    occ due to O2  . OA (osteoarthritis)   . Obesity (BMI 30-39.9)   . Obstructive sleep apnea on CPAP   . Osteopenia   . Peripheral neuropathy   . Seizure ([place])    2009  . Sleep apnea    dx 2yrs ago doesnt use cpap but uses O2 at nite 2L  . Stones in the urinary tract   . Type 2 diabetes mellitus with vascular disease ([place])   . UTI (lower urinary tract infection)    Past Surgical History:  Procedure Laterality Date  . ABDOMINAL HYSTERECTOMY  70's  . CARDIAC CATHETERIZATION    . CARPAL TUNNEL RELEASE Bilateral   . CHOLECYSTECTOMY    . CORONARY ARTERY BYPASS GRAFT  96  . SHOULDER ARTHROSCOPY     rt  . SHOULDER ARTHROSCOPY WITH ROTATOR CUFF REPAIR AND SUBACROMIAL DECOMPRESSION  12/01/2011   Procedure: SHOULDER ARTHROSCOPY WITH ROTATOR CUFF REPAIR AND SUBACROMIAL DECOMPRESSION;  Surgeon: Nita Sells, MD;  Location: [facility];  Service: Orthopedics;  Laterality: Left;  shoulder debridement calcific tendonitis     A IV Location/Drains/Wounds Patient Lines/Drains/Airways Status   Active Line/Drains/Airways    None          Intake/Output Last 24 hours No intake  or output data in the 24 hours ending 08/22/18 2053  Labs/Imaging Results for orders placed or performed during the hospital encounter of 08/22/18 (from the past 48 hour(s))  Protime-INR     Status: None   Collection Time: 08/22/18  1:51 PM  Result Value Ref Range   Prothrombin Time 13.9 11.4 - 15.2 seconds   INR 1.1 0.8 - 1.2    Comment: (NOTE) INR goal varies based on device and disease states. Performed at [facility], [place] [address], [place], [place] [address]   APTT     Status: None   Collection Time: 08/22/18  1:51 PM  Result Value Ref Range   aPTT 27 24 - 36 seconds    Comment: Performed at [facility] [address], [place], [place] [address]  CBC      Status: Abnormal   Collection Time: 08/22/18  1:51 PM  Result Value Ref Range   WBC 9.0 4.0 - 10.5 K/uL   RBC 4.38 3.87 - 5.11 MIL/uL   Hemoglobin 11.3 (L) 12.0 - 15.0 g/dL   HCT 36.9 36.0 - 46.0 %   MCV 84.2 80.0 - 100.0 fL   MCH 25.8 (L) 26.0 - 34.0 pg   MCHC 30.6 30.0 - 36.0 g/dL   RDW 15.4 11.5 - 15.5 %   Platelets 242 150 - 400 K/uL   nRBC 0.0 0.0 - 0.2 %    Comment: Performed at [facility], [place] [address] [place], [place] [address]  Differential     Status: None   Collection Time: 08/22/18  1:51 PM  Result Value Ref Range   Neutrophils Relative % 62 %   Neutro Abs 5.6 1.7 - 7.7 K/uL   Lymphocytes Relative 27 %   Lymphs Abs 2.5 0.7 - 4.0 K/uL   Monocytes Relative 8 %   Monocytes Absolute 0.7 0.1 - 1.0 K/uL   Eosinophils Relative 2 %   Eosinophils Absolute 0.2 0.0 - 0.5 K/uL   Basophils Relative 1 %   Basophils Absolute 0.1 0.0 - 0.1 K/uL   Immature Granulocytes 0 %   Abs Immature Granulocytes 0.03 0.00 - 0.07 K/uL    Comment: Performed at [facility], [place] [address], [place], [place] [address]  Comprehensive metabolic panel     Status: Abnormal   Collection Time: 08/22/18  1:51 PM  Result Value Ref Range   Sodium 137 135 - 145 mmol/L   Potassium 3.5 3.5 - 5.1 mmol/L   Chloride 100 98 - 111 mmol/L   CO2 27 22 - 32 mmol/L   Glucose, Bld 320 (H) 70 - 99 mg/dL   BUN 21 8 - 23 mg/dL   Creatinine, Ser 1.19 (H) 0.44 - 1.00 mg/dL   Calcium 8.4 (L) 8.9 - 10.3 mg/dL   Total Protein 6.6 6.5 - 8.1 g/dL   Albumin 3.2 (L) 3.5 - 5.0 g/dL   AST 17 15 - 41 U/L   ALT 13 0 - 44 U/L   Alkaline Phosphatase 111 38 - 126 U/L   Total Bilirubin 0.5 0.3 - 1.2 mg/dL   GFR calc non Af Amer 42 (L) >60 mL/min   GFR calc Af Amer 49 (L) >60 mL/min   Anion gap 10 5 - 15    Comment: Performed at [facility] [address], [place], [place] [address]   Mr Brain Wo Contrast  Result Date: 08/22/2018 CLINICAL DATA:  New onset right visual field deficit  beginning 2 days  ago. EXAM: MRI HEAD WITHOUT CONTRAST TECHNIQUE: Multiplanar, multiecho pulse sequences of the brain and surrounding structures were obtained without intravenous contrast. COMPARISON:  MRI brain 04/17/2013 FINDINGS: Brain: Diffusion-weighted images demonstrate a linear 2 cm acute nonhemorrhagic infarct involving the medial left occipital lobe, likely impacting the visual cortex. No other acute infarct is present. T2 signal changes are associated with the areas of restricted diffusion. Moderate generalized atrophy is present. Moderate periventricular and subcortical T2 changes are noted bilaterally. A remote lacunar infarct is present in the right caudate head. Remote ischemic changes are noted in the thalamus and extend into the brainstem bilaterally. Cerebellum is unremarkable. The ventricles are of normal size. No significant extraaxial fluid collection is present. There is no significant change in the atrophy or matter disease. Vascular: Flow is present in the major intracranial arteries. Skull and upper cervical spine: The craniocervical junction is normal. Upper cervical spine is within normal limits. Marrow signal is unremarkable. Sinuses/Orbits: The paranasal sinuses and mastoid air cells are clear. Bilateral lens replacements are noted. Globes and orbits are otherwise unremarkable. IMPRESSION: 1. Acute/subacute nonhemorrhagic infarct involving the medial left occipital lobe corresponds with the patient's recent visual changes. 2. Moderate atrophy and white matter disease is otherwise stable. Electronically Signed   By: San Morelle M.D.   On: 08/22/2018 18:32    Pending Labs Unresulted Labs (From admission, onward)    Start     Ordered   08/22/18 1842  SARS CORONAVIRUS 2 Nasal Swab Aptima Multi Swab  (Asymptomatic/Tier 2 Patients Labs)  Once,   STAT    Question Answer Comment  Is this test for diagnosis or screening Screening   Symptomatic for COVID-19 as defined by CDC No   Hospitalized for  COVID-19 No   Admitted to ICU for COVID-19 No   Previously tested for COVID-19 No   Resident in a congregate (group) care setting No   Employed in healthcare setting No   Pregnant No      08/22/18 1842   Signed and Held  Hemoglobin A1c  Tomorrow morning,   R     Signed and Held   Signed and Held  Lipid panel  Tomorrow morning,   R    Comments: Fasting    Signed and Held   Signed and Held  Hemoglobin A1c  Once,   R    Comments: To assess prior glycemic control    Signed and Held   Signed and Held  CBC  (enoxaparin (LOVENOX)    CrCl >/= 30 ml/min)  Once,   R    Comments: Baseline for enoxaparin therapy IF NOT ALREADY DRAWN.  Notify MD if PLT < 100 K.    Signed and Held   Signed and Held  Creatinine, serum  (enoxaparin (LOVENOX)    CrCl >/= 30 ml/min)  Once,   R    Comments: Baseline for enoxaparin therapy IF NOT ALREADY DRAWN.    Signed and Held   Signed and Held  Creatinine, serum  (enoxaparin (LOVENOX)    CrCl >/= 30 ml/min)  Weekly,   R    Comments: while on enoxaparin therapy    Signed and Held   Signed and Held  CBC  Tomorrow morning,   R     Signed and Held   Signed and Held  Comprehensive metabolic panel  Tomorrow morning,   R     Signed and Held          Vitals/Pain Today's  Vitals   08/22/18 1317 08/22/18 1322 08/22/18 1355 08/22/18 1621  BP: (!) 102/47   (!) 174/63  Pulse: (!) 57   64  Resp: 18   16  Temp:   98.4 F (36.9 C)   TempSrc:   Oral   SpO2: 98%   100%  PainSc:  5       Isolation Precautions No active isolations  Medications Medications  sodium chloride flush (NS) 0.9 % injection 3 mL (has no administration in time range)    Mobility walks with device     Focused Assessments Neuro Assessment Handoff:  Swallow screen pass? Yes    NIH Stroke Scale ( + Modified Stroke Scale Criteria)  Interval: Initial Level of Consciousness (1a.)   : Alert, keenly responsive LOC Questions (1b. )   +: Answers both questions correctly LOC Commands  (1c. )   + : Performs both tasks correctly Best Gaze (2. )  +: Normal Visual (3. )  +: Partial hemianopia Facial Palsy (4. )    : Normal symmetrical movements Motor Arm, Left (5a. )   +: No drift Motor Arm, Right (5b. )   +: No drift Motor Leg, Left (6a. )   +: No drift Motor Leg, Right (6b. )   +: No drift Limb Ataxia (7. ): Absent Sensory (8. )   +: Normal, no sensory loss Best Language (9. )   +: No aphasia Dysarthria (10. ): Normal Extinction/Inattention (11.)   +: No Abnormality Modified SS Total  +: 1 Complete NIHSS TOTAL: 1     Neuro Assessment: Exceptions to WDL Neuro Checks:   Initial (08/22/18 1518)  Last Documented NIHSS Modified Score: 1 (08/22/18 1518) Has TPA been given? No If patient is a Neuro Trauma and patient is going to OR before floor call report to [place] nurse: [phone] or [phone]     R Recommendations: See Admitting Provider Note  Report given to:   Additional Notes:

## 2018-08-22 NOTE — ED Notes (Signed)
ED Provider at bedside. 

## 2018-08-22 NOTE — ED Triage Notes (Signed)
Pt was sent over from ophthalmology due to loss of half of her vision in right eye- pt states she had a vision change on Monday seen her eye doctor today and was sent over for stroke workup-pt has papers with MDs number for ER doc if they have questions.

## 2018-08-22 NOTE — ED Provider Notes (Signed)
[facility] EMERGENCY DEPARTMENT Provider Note   CSN: [id number] Arrival date & time: 08/22/18  1309    History   Chief Complaint Chief Complaint  Patient presents with  . Visual Field Change    HPI Linda Oliver is a 85 y.o. female.     The history is provided by the patient and medical records. No language interpreter was used.   Linda Oliver is a 85 y.o. female  with a PMH as listed below including hypertension, hyperlipidemia, CAD with previous CABG who presents to the Emergency Department complaining of difficulty with vision out of her right eye which began on Monday (2 days ago).  She saw her ophthalmologist today who recommended that she come to the ER out of concerns for possible stroke due to right homonymous visual field deficit.  She states that she has had persistent difficulties with her vision out of this eye, but does feel like it is somewhat improving.  Denies any changes with her speech or gait/ambulation.  No numbness or weakness to her arms or legs.  Denies history of similar events.  She is not on any anticoagulants, but does take a full dose aspirin daily.  Past Medical History:  Diagnosis Date  . Anginal pain ([place])    occ  . Anxiety   . Broken ankle   . Bullous pemphigus   . CAD (coronary artery disease)    [place]   80% stenosis ostial Left main, 30% stenosis proximal LAD, 80% stenosis proximal RCA CABG  -  LIMA to LAD, SVG to int, SVG to [place], [place]   . Depression   . Diabetic peripheral neuropathy ([place])   . GERD (gastroesophageal reflux disease)   . Headache(784.0)    hx migraines  . Heart murmur    mvp  . Hyperlipidemia   . Hypertension   . Hypertensive heart disease   . Nasal bleeding    occ due to O2  . OA (osteoarthritis)   . Obesity (BMI 30-39.9)   . Obstructive sleep apnea on CPAP   . Osteopenia   . Peripheral neuropathy   . Seizure ([place])    2009  . Sleep apnea    dx 35yrs ago doesnt use  cpap but uses O2 at nite 2L  . Stones in the urinary tract   . Type 2 diabetes mellitus with vascular disease ([place])   . UTI (lower urinary tract infection)     Patient Active Problem List   Diagnosis Date Noted  . Autonomic orthostatic hypotension 02/15/2018  . Carotid stenosis, asymptomatic, bilateral 02/15/2018  . Subjective memory complaints 11/19/2017  . Hereditary and idiopathic peripheral neuropathy 02/19/2014  . Coronary artery disease without angina pectoris   . Type 2 diabetes mellitus with vascular disease ([place])   . Hyperlipidemia   . Hypertensive heart disease   . Obesity (BMI 30-39.9)   . GERD (gastroesophageal reflux disease)   . Bullous pemphigus   . Diabetic peripheral neuropathy ([place])   . H/O three vessel coronary artery bypass 03/30/1994    Past Surgical History:  Procedure Laterality Date  . ABDOMINAL HYSTERECTOMY  70's  . CARDIAC CATHETERIZATION    . CARPAL TUNNEL RELEASE Bilateral   . CHOLECYSTECTOMY    . CORONARY ARTERY BYPASS GRAFT  96  . SHOULDER ARTHROSCOPY     rt  . SHOULDER ARTHROSCOPY WITH ROTATOR CUFF REPAIR AND SUBACROMIAL DECOMPRESSION  12/01/2011   Procedure: SHOULDER ARTHROSCOPY WITH ROTATOR CUFF  REPAIR AND SUBACROMIAL DECOMPRESSION;  Surgeon: Nita Sells, MD;  Location: [facility];  Service: Orthopedics;  Laterality: Left;  shoulder debridement calcific tendonitis     OB History   No obstetric history on file.      Home Medications    Prior to Admission medications   Medication Sig Start Date End Date Taking? Authorizing Provider  acetaminophen (TYLENOL) 500 MG tablet Take 500 mg by mouth every 4 (four) hours as needed for moderate pain.    [provider]  amoxicillin (AMOXIL) 500 MG capsule TAKE 4 CAPSULES BY MOUTH 1 HOUR PRIOR TO DENTAL APPOINTMENT 03/22/18   [provider]  aspirin 325 MG tablet Take 325 mg by mouth daily.    [provider]  busPIRone (BUSPAR) 7.5 MG tablet Take 7.5 mg by mouth 2  (two) times daily.    [provider]  cyanocobalamin 1000 MCG tablet Take 1,000 mcg by mouth daily.    [provider]  DULoxetine (CYMBALTA) 60 MG capsule Take 60 mg by mouth daily. 10/16/17   [provider]  fesoterodine (TOVIAZ) 8 MG TB24 tablet Take 8 mg by mouth daily.    [provider]  fluticasone (FLONASE) 50 MCG/ACT nasal spray Place 1 spray into both nostrils 2 (two) times daily as needed for allergies or rhinitis.    [provider]  gabapentin (NEURONTIN) 300 MG capsule Take 300 mg by mouth 2 (two) times daily.    [provider]  glimepiride (AMARYL) 1 MG tablet Take 1 mg by mouth daily.    [provider]  hydrOXYzine (ATARAX/VISTARIL) 25 MG tablet Take 25 mg by mouth 2 (two) times daily.    [provider]  Insulin Glargine (LANTUS [place]) Inject 37 Units into the skin daily.     [provider]  lactobacillus acidophilus (BACID) TABS tablet Take 1 tablet by mouth daily.    [provider]  metoprolol succinate (TOPROL-XL) 25 MG 24 hr tablet Take 12.5 mg by mouth 2 (two) times daily.    [provider]  [facility] 28-10 MG CP24 TAKE 1 CAPSULE DAILY 12/28/17   Melvenia Beam, MD  pantoprazole (PROTONIX) 40 MG tablet Take 40 mg by mouth daily. 10/31/17   [provider]  rosuvastatin (CRESTOR) 10 MG tablet Take 10 mg by mouth daily. 11/06/17   [provider]  sitaGLIPtin (JANUVIA) 100 MG tablet Take 100 mg by mouth daily.    [provider]  valsartan-hydrochlorothiazide (DIOVAN-HCT) 160-25 MG tablet Take 0.5 tablets by mouth daily. 07/16/18   Adrian Prows, MD    Family History Family History  Problem Relation Age of Onset  . Coronary artery disease Mother   . Heart disease Father   . Cancer Father        skin  . Cancer Brother   . Cancer - Colon Brother        brain, Lymphoma    Social History Social History   Tobacco Use  . Smoking status: Former  Smoker    Packs/day: 1.00    Years: 30.00    Pack years: 30.00    Types: Cigarettes    Quit date: 11/25/1986    Years since quitting: 31.7  . Smokeless tobacco: Never Used  Substance Use Topics  . Alcohol use: No  . Drug use: No     Allergies   Percocet [oxycodone-acetaminophen] and Latex   Review of Systems Review of Systems  Eyes: Positive for visual disturbance.  All  other systems reviewed and are negative.    Physical Exam Updated Vital Signs BP (!) 174/63 (BP Location: Left Arm)   Pulse 64   Temp 98.4 F (36.9 C) (Oral)   Resp 16   SpO2 100%   Physical Exam Vitals signs and nursing note reviewed.  Constitutional:      General: She is not in acute distress.    Appearance: She is well-developed.  HENT:     Head: Normocephalic and atraumatic.  Neck:     Musculoskeletal: Neck supple.  Cardiovascular:     Rate and Rhythm: Normal rate and regular rhythm.     Heart sounds: Normal heart sounds. No murmur.  Pulmonary:     Effort: Pulmonary effort is normal. No respiratory distress.     Breath sounds: Normal breath sounds.  Abdominal:     General: There is no distension.     Palpations: Abdomen is soft.     Tenderness: There is no abdominal tenderness.  Skin:    General: Skin is warm and dry.  Neurological:     Mental Status: She is alert and oriented to person, place, and time.     Comments: Alert, oriented, thought content appropriate, able to give a coherent history. Speech is clear and goal oriented, able to follow commands.  Cranial Nerves:  II: Right peripheral field deficit.  Eyes were dilated at ophthalmologist office earlier today. III, IV, VI: EOM intact bilaterally, ptosis not present V,VII: smile symmetric, eyes kept closed tightly against resistance, facial light touch sensation equal VIII: hearing grossly normal IX, X: symmetric soft palate movement, uvula elevates symmetrically  XI: bilateral shoulder shrug symmetric and strong XII: midline  tongue extension 5/5 muscle strength in upper and lower extremities bilaterally including strong and equal grip strength and dorsiflexion/plantar flexion Sensory to light touch normal in all four extremities.       ED Treatments / Results  Labs (all labs ordered are listed, but only abnormal results are displayed) Labs Reviewed  CBC - Abnormal; Notable for the following components:      Result Value   Hemoglobin 11.3 (*)    MCH 25.8 (*)    All other components within normal limits  COMPREHENSIVE METABOLIC PANEL - Abnormal; Notable for the following components:   Glucose, Bld 320 (*)    Creatinine, Ser 1.19 (*)    Calcium 8.4 (*)    Albumin 3.2 (*)    GFR calc non Af Amer 42 (*)    GFR calc Af Amer 49 (*)    All other components within normal limits  SARS CORONAVIRUS 2  PROTIME-INR  APTT  DIFFERENTIAL    EKG None  Radiology Mr Brain Wo Contrast  Result Date: 08/22/2018 CLINICAL DATA:  New onset right visual field deficit beginning 2 days ago. EXAM: MRI HEAD WITHOUT CONTRAST TECHNIQUE: Multiplanar, multiecho pulse sequences of the brain and surrounding structures were obtained without intravenous contrast. COMPARISON:  MRI brain 04/17/2013 FINDINGS: Brain: Diffusion-weighted images demonstrate a linear 2 cm acute nonhemorrhagic infarct involving the medial left occipital lobe, likely impacting the visual cortex. No other acute infarct is present. T2 signal changes are associated with the areas of restricted diffusion. Moderate generalized atrophy is present. Moderate periventricular and subcortical T2 changes are noted bilaterally. A remote lacunar infarct is present in the right caudate head. Remote ischemic changes are noted in the thalamus and extend into the brainstem bilaterally. Cerebellum is unremarkable. The ventricles are of normal size. No significant extraaxial fluid collection is  present. There is no significant change in the atrophy or matter disease. Vascular: Flow is  present in the major intracranial arteries. Skull and upper cervical spine: The craniocervical junction is normal. Upper cervical spine is within normal limits. Marrow signal is unremarkable. Sinuses/Orbits: The paranasal sinuses and mastoid air cells are clear. Bilateral lens replacements are noted. Globes and orbits are otherwise unremarkable. IMPRESSION: 1. Acute/subacute nonhemorrhagic infarct involving the medial left occipital lobe corresponds with the patient's recent visual changes. 2. Moderate atrophy and white matter disease is otherwise stable. Electronically Signed   By: San Morelle M.D.   On: 08/22/2018 18:32    Procedures Procedures (including critical care time)  Medications Ordered in ED Medications  sodium chloride flush (NS) 0.9 % injection 3 mL (has no administration in time range)     Initial Impression / Assessment and Plan / ED Course  I have reviewed the triage vital signs and the nursing notes.  Pertinent labs & imaging results that were available during my care of the patient were reviewed by me and considered in my medical decision making (see chart for details).       Linda Oliver is a 85 y.o. female who presents to ED from ophthalmologist for right homonymous visual field defect concerning for stroke.  Patient states that she noted right eye visual changes on Monday (2 days ago).  Her symptoms have somewhat improved, but she is still having difficulty with vision laterally out of the right eye.  Otherwise, she has no neurologic deficits appreciated.  Discussed case with Dr. Rory Percy, recommending MR brain which was obtained showing acute / subacute hemorrhagic infarct involving the medial left occipital lobe.  Dr. Rory Percy made aware of MRI findings.  Hospitalist consulted who will admit.  Patient discussed with Dr. Ralene Bathe who agrees with treatment plan.    Final Clinical Impressions(s) / ED Diagnoses   Final diagnoses:  Cerebrovascular accident (CVA),  unspecified mechanism [facility])    ED Discharge Orders    None       Ward, Ozella Almond, PA-C 08/22/18 1859    Quintella Reichert, MD 08/23/18 1219

## 2018-08-22 NOTE — ED Notes (Signed)
Called in dinner tray.

## 2018-08-22 NOTE — ED Notes (Signed)
Pt has had her throat stretched in the past. She is careful with eating and drinking as a result. She passed swallow screen.

## 2018-08-22 NOTE — ED Notes (Signed)
Pt ambulated self efficiently to restroom with the use of a walker. Pt returned safely to bedside.

## 2018-08-22 NOTE — Consult Note (Signed)
Referring Physician: Dr. Jonelle Sidle    Chief Complaint: Subacute stroke.   HPI: Linda Oliver is an 85 y.o. female who was sent to the ED today from Ophthalmology for further assessment of loss of vision in half of the visual field of her right eye. Due to hemifield loss, a stroke work up was indicated. Vision change occurred on Monday. She has no prior history of TIA or stroke. She is on daily ASA at home.   Her PMHx includes DM, diabetic peripheral neuropathy, migraine headaches, HTN, HLD, OSA, autonomic orthostatic hypotension, asymptomatic carotid stenosis, prior seizure and CAD with prior CABG.   The patient denied speech changes, difficulty with ambulation, limb numbness or limb weakness.   MRI obtained in the ED reveals an acute/subacute nonhemorrhagic infarct involving the medial left occipital lobe. Stable moderate atrophy and white matter disease is also noted.   LSN: Monday tPA Given: No: Out of time window.   Past Medical History:  Diagnosis Date   Anginal pain ([place])    occ   Anxiety    Broken ankle    Bullous pemphigus    CAD (coronary artery disease)    [place]   80% stenosis ostial Left main, 30% stenosis proximal LAD, 80% stenosis proximal RCA CABG  -  LIMA to LAD, SVG to int, SVG to [place], [place]    Depression    Diabetic peripheral neuropathy (HCC)    GERD (gastroesophageal reflux disease)    Headache(784.0)    hx migraines   Heart murmur    mvp   Hyperlipidemia    Hypertension    Hypertensive heart disease    Nasal bleeding    occ due to O2   OA (osteoarthritis)    Obesity (BMI 30-39.9)    Obstructive sleep apnea on CPAP    Osteopenia    Peripheral neuropathy    Seizure ([place])    2009   Sleep apnea    dx 55yrs ago doesnt use cpap but uses O2 at nite 2L   Stones in the urinary tract    Type 2 diabetes mellitus with vascular disease ([place])    UTI (lower urinary tract infection)     Past Surgical History:    Procedure Laterality Date   ABDOMINAL HYSTERECTOMY  70's   CARDIAC CATHETERIZATION     CARPAL TUNNEL RELEASE Bilateral    CHOLECYSTECTOMY     CORONARY ARTERY BYPASS GRAFT  96   SHOULDER ARTHROSCOPY     rt   SHOULDER ARTHROSCOPY WITH ROTATOR CUFF REPAIR AND SUBACROMIAL DECOMPRESSION  12/01/2011   Procedure: SHOULDER ARTHROSCOPY WITH ROTATOR CUFF REPAIR AND SUBACROMIAL DECOMPRESSION;  Surgeon: Nita Sells, MD;  Location: [facility];  Service: Orthopedics;  Laterality: Left;  shoulder debridement calcific tendonitis    Family History  Problem Relation Age of Onset   Coronary artery disease Mother    Heart disease Father    Cancer Father        skin   Cancer Brother    Cancer - [place] Brother        brain, Lymphoma   Social History:  reports that she quit smoking about 31 years ago. Her smoking use included cigarettes. She has a 30.00 pack-year smoking history. She has never used smokeless tobacco. She reports that she does not drink alcohol or use drugs.  Allergies:  Allergies  Allergen Reactions   Percocet [Oxycodone-Acetaminophen] Nausea And Vomiting   Latex Itching and Rash    ekg  leads    Home Medications:     ROS: As per HPI. Does not endorse any symptoms other than right hemifield vision loss.   Physical Examination: Blood pressure (!) 174/63, pulse 64, temperature 98.4 F (36.9 C), temperature source Oral, resp. rate 16, SpO2 100 %.  HEENT: [place]/AT Lungs: Respirations unlabored Ext: No edema  Neurologic Examination: Mental Status:  Alert, oriented, thought content appropriate.  Speech fluent without evidence of aphasia.  Able to follow all commands without difficulty. Cranial Nerves: II:  Right homonymous crescentic visual field defect OU. Pupils are equal.  III,IV, VI: EOMI without nystagmus. No ptosis.  V,VII: No facial droop. Facial temp sensation equal bilaterally VIII: hearing intact to voice IX,X: No hypophonia XI: Symmetric  shoulder shrug XII: midline tongue extension  Motor: Right : Upper extremity   5/5    Left:     Upper extremity   5/5  Lower extremity   5/5     Lower extremity   5/5 No pronator drift Sensory: Temp and light touch intact x 4. No extinction.  Deep Tendon Reflexes:  2+ bilateral brachioradialis, biceps, patellae and achilles.  Cerebellar: No ataxia with FNF bilaterally  Gait: Deferred  Results for orders placed or performed during the hospital encounter of 08/22/18 (from the past 48 hour(s))  Protime-INR     Status: None   Collection Time: 08/22/18  1:51 PM  Result Value Ref Range   Prothrombin Time 13.9 11.4 - 15.2 seconds   INR 1.1 0.8 - 1.2    Comment: (NOTE) INR goal varies based on device and disease states. Performed at [facility], [place] [address] [place], [place] [address]   APTT     Status: None   Collection Time: 08/22/18  1:51 PM  Result Value Ref Range   aPTT 27 24 - 36 seconds    Comment: Performed at [facility] [address] [place], [place] [address]  CBC     Status: Abnormal   Collection Time: 08/22/18  1:51 PM  Result Value Ref Range   WBC 9.0 4.0 - 10.5 K/uL   RBC 4.38 3.87 - 5.11 MIL/uL   Hemoglobin 11.3 (L) 12.0 - 15.0 g/dL   HCT 36.9 36.0 - 46.0 %   MCV 84.2 80.0 - 100.0 fL   MCH 25.8 (L) 26.0 - 34.0 pg   MCHC 30.6 30.0 - 36.0 g/dL   RDW 15.4 11.5 - 15.5 %   Platelets 242 150 - 400 K/uL   nRBC 0.0 0.0 - 0.2 %    Comment: Performed at [facility], [place] [address] [place], [place] [address]  Differential     Status: None   Collection Time: 08/22/18  1:51 PM  Result Value Ref Range   Neutrophils Relative % 62 %   Neutro Abs 5.6 1.7 - 7.7 K/uL   Lymphocytes Relative 27 %   Lymphs Abs 2.5 0.7 - 4.0 K/uL   Monocytes Relative 8 %   Monocytes Absolute 0.7 0.1 - 1.0 K/uL   Eosinophils Relative 2 %   Eosinophils Absolute 0.2 0.0 - 0.5 K/uL   Basophils Relative 1 %   Basophils Absolute 0.1 0.0 - 0.1 K/uL   Immature Granulocytes  0 %   Abs Immature Granulocytes 0.03 0.00 - 0.07 K/uL    Comment: Performed at [facility], [place] [address], [place], [place] [address]  Comprehensive metabolic panel     Status: Abnormal   Collection Time: 08/22/18  1:51 PM  Result Value Ref Range   Sodium 137 135 - 145 mmol/L   Potassium 3.5 3.5 - 5.1 mmol/L   Chloride 100 98 - 111 mmol/L   CO2 27 22 - 32 mmol/L   Glucose, Bld 320 (H) 70 - 99 mg/dL   BUN 21 8 - 23 mg/dL   Creatinine, Ser 1.19 (H) 0.44 - 1.00 mg/dL   Calcium 8.4 (L) 8.9 - 10.3 mg/dL   Total Protein 6.6 6.5 - 8.1 g/dL   Albumin 3.2 (L) 3.5 - 5.0 g/dL   AST 17 15 - 41 U/L   ALT 13 0 - 44 U/L   Alkaline Phosphatase 111 38 - 126 U/L   Total Bilirubin 0.5 0.3 - 1.2 mg/dL   GFR calc non Af Amer 42 (L) >60 mL/min   GFR calc Af Amer 49 (L) >60 mL/min   Anion gap 10 5 - 15    Comment: Performed at [facility] [address], [place], [place] [address]   Mr Brain 66 Contrast  Result Date: 08/22/2018 CLINICAL DATA:  New onset right visual field deficit beginning 2 days ago. EXAM: MRI HEAD WITHOUT CONTRAST TECHNIQUE: Multiplanar, multiecho pulse sequences of the brain and surrounding structures were obtained without intravenous contrast. COMPARISON:  MRI brain 04/17/2013 FINDINGS: Brain: Diffusion-weighted images demonstrate a linear 2 cm acute nonhemorrhagic infarct involving the medial left occipital lobe, likely impacting the visual cortex. No other acute infarct is present. T2 signal changes are associated with the areas of restricted diffusion. Moderate generalized atrophy is present. Moderate periventricular and subcortical T2 changes are noted bilaterally. A remote lacunar infarct is present in the right caudate head. Remote ischemic changes are noted in the thalamus and extend into the brainstem bilaterally. Cerebellum is unremarkable. The ventricles are of normal size. No significant extraaxial fluid collection is present. There is no significant change in the  atrophy or matter disease. Vascular: Flow is present in the major intracranial arteries. Skull and upper cervical spine: The craniocervical junction is normal. Upper cervical spine is within normal limits. Marrow signal is unremarkable. Sinuses/Orbits: The paranasal sinuses and mastoid air cells are clear. Bilateral lens replacements are noted. Globes and orbits are otherwise unremarkable. IMPRESSION: 1. Acute/subacute nonhemorrhagic infarct involving the medial left occipital lobe corresponds with the patient's recent visual changes. 2. Moderate atrophy and white matter disease is otherwise stable. Electronically Signed   By: San Morelle M.D.   On: 08/22/2018 18:32    Assessment: 85 y.o. female presenting with acute right sided vision loss 1. Exam reveals crescentic right homonymous visual field deficit. No motor weakness, facial droop, aphasia or sensory impairment noted.  2. MRI brain: Acute/subacute nonhemorrhagic infarct involving the medial left occipital lobe. Stable moderate atrophy and white matter disease is also noted.  3. Stroke Risk Factors - DM, HTN, HLD, OSA, carotid stenosis, and CAD   Recommendations: 1. HgbA1c, fasting lipid panel 2. MRA of the brain without contrast 3. PT consult, OT consult, Speech consult 4. Echocardiogram 5. Carotid dopplers 6. Prophylactic therapy- Add Plavix to ASA 7. Risk factor modification 8. Telemetry monitoring 9. Frequent neuro checks 10. Continue Crestor.  11. BP management. Out of permissive HTN time window   @Electronically  signed: Dr. Kerney Elbe 08/22/2018, 7:20 PM

## 2018-08-22 NOTE — ED Notes (Signed)
IV attempted  

## 2018-08-23 ENCOUNTER — Encounter (HOSPITAL_COMMUNITY): Payer: Self-pay

## 2018-08-23 ENCOUNTER — Inpatient Hospital Stay (HOSPITAL_COMMUNITY): Payer: Medicare Other

## 2018-08-23 ENCOUNTER — Encounter (HOSPITAL_COMMUNITY): Payer: Medicare Other

## 2018-08-23 ENCOUNTER — Other Ambulatory Visit: Payer: Self-pay

## 2018-08-23 DIAGNOSIS — I351 Nonrheumatic aortic (valve) insufficiency: Secondary | ICD-10-CM

## 2018-08-23 DIAGNOSIS — I639 Cerebral infarction, unspecified: Secondary | ICD-10-CM

## 2018-08-23 DIAGNOSIS — E1159 Type 2 diabetes mellitus with other circulatory complications: Secondary | ICD-10-CM

## 2018-08-23 LAB — LIPID PANEL
Cholesterol: 127 mg/dL (ref 0–200)
HDL: 27 mg/dL — ABNORMAL LOW (ref >40)
LDL Cholesterol: 28 mg/dL (ref 0–99)
Total CHOL/HDL Ratio: 4.7 RATIO
Triglycerides: 358 mg/dL — ABNORMAL HIGH (ref <150)
VLDL: 72 mg/dL — ABNORMAL HIGH (ref 0–40)

## 2018-08-23 LAB — GLUCOSE, CAPILLARY
Glucose-Capillary: 118 mg/dL — ABNORMAL HIGH (ref 70–99)
Glucose-Capillary: 222 mg/dL — ABNORMAL HIGH (ref 70–99)
Glucose-Capillary: 228 mg/dL — ABNORMAL HIGH (ref 70–99)
Glucose-Capillary: 195 mg/dL — ABNORMAL HIGH (ref 70–99)

## 2018-08-23 LAB — COMPREHENSIVE METABOLIC PANEL
ALT: 12 U/L (ref 0–44)
AST: 15 U/L (ref 15–41)
Albumin: 2.9 g/dL — ABNORMAL LOW (ref 3.5–5.0)
Alkaline Phosphatase: 100 U/L (ref 38–126)
Anion gap: 9 (ref 5–15)
BUN: 15 mg/dL (ref 8–23)
CO2: 28 mmol/L (ref 22–32)
Calcium: 8.5 mg/dL — ABNORMAL LOW (ref 8.9–10.3)
Chloride: 105 mmol/L (ref 98–111)
Creatinine, Ser: 0.9 mg/dL (ref 0.44–1.00)
GFR calc Af Amer: >60 mL/min (ref >60)
GFR calc non Af Amer: 59 mL/min — ABNORMAL LOW (ref >60)
Glucose, Bld: 150 mg/dL — ABNORMAL HIGH (ref 70–99)
Potassium: 3.7 mmol/L (ref 3.5–5.1)
Sodium: 142 mmol/L (ref 135–145)
Total Bilirubin: 0.4 mg/dL (ref 0.3–1.2)
Total Protein: 6 g/dL — ABNORMAL LOW (ref 6.5–8.1)

## 2018-08-23 LAB — CBC
HCT: 34.7 % — ABNORMAL LOW (ref 36.0–46.0)
Hemoglobin: 11.1 g/dL — ABNORMAL LOW (ref 12.0–15.0)
MCH: 26.4 pg (ref 26.0–34.0)
MCHC: 32 g/dL (ref 30.0–36.0)
MCV: 82.4 fL (ref 80.0–100.0)
Platelets: 240 10*3/uL (ref 150–400)
RBC: 4.21 MIL/uL (ref 3.87–5.11)
RDW: 15.2 % (ref 11.5–15.5)
WBC: 8.6 10*3/uL (ref 4.0–10.5)
nRBC: 0 % (ref 0.0–0.2)

## 2018-08-23 LAB — ECHOCARDIOGRAM COMPLETE
Height: 60 in
Weight: 2779.56 oz

## 2018-08-23 LAB — HEMOGLOBIN A1C
Hgb A1c MFr Bld: 8.7 % — ABNORMAL HIGH (ref 4.8–5.6)
Mean Plasma Glucose: 202.99 mg/dL

## 2018-08-23 MED ORDER — CLOPIDOGREL BISULFATE 75 MG PO TABS
75.0000 mg | ORAL_TABLET | Freq: Every day | ORAL | Status: DC
  Administered 2018-08-23 – 2018-08-24 (×2): 75 mg via ORAL
  Filled 2018-08-23 (×2): qty 1

## 2018-08-23 MED ORDER — EZETIMIBE 10 MG PO TABS
10.0000 mg | ORAL_TABLET | Freq: Every day | ORAL | Status: DC
  Administered 2018-08-23 – 2018-08-24 (×2): 10 mg via ORAL
  Filled 2018-08-23 (×2): qty 1

## 2018-08-23 MED ORDER — LOPERAMIDE HCL 2 MG PO CAPS
4.0000 mg | ORAL_CAPSULE | Freq: Once | ORAL | Status: AC
  Administered 2018-08-24: 4 mg via ORAL
  Filled 2018-08-23: qty 2

## 2018-08-23 MED ORDER — DONEPEZIL HCL 10 MG PO TABS
10.0000 mg | ORAL_TABLET | Freq: Every day | ORAL | Status: DC
  Administered 2018-08-23 – 2018-08-24 (×2): 10 mg via ORAL
  Filled 2018-08-23 (×2): qty 1

## 2018-08-23 MED ORDER — MEMANTINE HCL-DONEPEZIL HCL ER 28-10 MG PO CP24: 1.0000 | ORAL_CAPSULE | Freq: Every evening | ORAL | Status: DC

## 2018-08-23 MED ORDER — BACID PO TABS
1.0000 | ORAL_TABLET | Freq: Every day | ORAL | Status: DC
  Administered 2018-08-23 – 2018-08-24 (×2): 1 via ORAL
  Filled 2018-08-23 (×2): qty 1

## 2018-08-23 MED ORDER — GABAPENTIN 300 MG PO CAPS
300.0000 mg | ORAL_CAPSULE | Freq: Two times a day (BID) | ORAL | Status: DC
  Administered 2018-08-23 – 2018-08-24 (×3): 300 mg via ORAL
  Filled 2018-08-23 (×4): qty 1

## 2018-08-23 MED ORDER — VITAMIN B-12 1000 MCG PO TABS
1000.0000 ug | ORAL_TABLET | Freq: Every day | ORAL | Status: DC
  Administered 2018-08-23 – 2018-08-24 (×2): 1000 ug via ORAL
  Filled 2018-08-23 (×2): qty 1

## 2018-08-23 MED ORDER — BUSPIRONE HCL 15 MG PO TABS
7.5000 mg | ORAL_TABLET | Freq: Two times a day (BID) | ORAL | Status: DC
  Administered 2018-08-23 – 2018-08-24 (×3): 7.5 mg via ORAL
  Filled 2018-08-23 (×4): qty 1

## 2018-08-23 MED ORDER — DULOXETINE HCL 60 MG PO CPEP
60.0000 mg | ORAL_CAPSULE | Freq: Every day | ORAL | Status: DC
  Administered 2018-08-23 – 2018-08-24 (×2): 60 mg via ORAL
  Filled 2018-08-23 (×2): qty 1

## 2018-08-23 MED ORDER — MEMANTINE HCL ER 28 MG PO CP24
28.0000 mg | ORAL_CAPSULE | Freq: Every day | ORAL | Status: DC
  Administered 2018-08-23 – 2018-08-24 (×2): 28 mg via ORAL
  Filled 2018-08-23 (×2): qty 1

## 2018-08-23 MED ORDER — ACETAMINOPHEN 325 MG PO TABS
650.0000 mg | ORAL_TABLET | Freq: Four times a day (QID) | ORAL | Status: DC | PRN
  Administered 2018-08-23: 650 mg via ORAL
  Filled 2018-08-23: qty 2

## 2018-08-23 MED ORDER — FLUTICASONE PROPIONATE 50 MCG/ACT NA SUSP
1.0000 | Freq: Two times a day (BID) | NASAL | Status: DC | PRN
  Administered 2018-08-23: 1 via NASAL
  Filled 2018-08-23: qty 16

## 2018-08-23 MED ORDER — METOPROLOL SUCCINATE ER 25 MG PO TB24
12.5000 mg | ORAL_TABLET | Freq: Two times a day (BID) | ORAL | Status: DC
  Administered 2018-08-23 – 2018-08-24 (×3): 12.5 mg via ORAL
  Filled 2018-08-23 (×3): qty 1

## 2018-08-23 MED ORDER — ASPIRIN EC 325 MG PO TBEC
325.0000 mg | DELAYED_RELEASE_TABLET | Freq: Every day | ORAL | Status: DC
  Administered 2018-08-24: 325 mg via ORAL
  Filled 2018-08-23: qty 1

## 2018-08-23 MED ORDER — ONDANSETRON HCL 4 MG/2ML IJ SOLN
4.0000 mg | Freq: Three times a day (TID) | INTRAMUSCULAR | Status: DC | PRN
  Administered 2018-08-24: 4 mg via INTRAVENOUS
  Filled 2018-08-23: qty 2

## 2018-08-23 MED ORDER — HYDROXYZINE HCL 25 MG PO TABS
50.0000 mg | ORAL_TABLET | Freq: Every evening | ORAL | Status: DC
  Administered 2018-08-23 – 2018-08-24 (×2): 50 mg via ORAL
  Filled 2018-08-23 (×2): qty 2

## 2018-08-23 MED ORDER — LABETALOL HCL 5 MG/ML IV SOLN: 10.0000 mg | INTRAVENOUS | Status: DC | PRN

## 2018-08-23 MED ORDER — ASPIRIN EC 81 MG PO TBEC
81.0000 mg | DELAYED_RELEASE_TABLET | Freq: Every day | ORAL | Status: DC
  Administered 2018-08-23: 08:00:00 81 mg via ORAL
  Filled 2018-08-23: qty 1

## 2018-08-23 MED ORDER — ROSUVASTATIN CALCIUM 5 MG PO TABS: 10.0000 mg | ORAL_TABLET | Freq: Every day | ORAL | Status: DC

## 2018-08-23 MED ORDER — IOHEXOL 350 MG/ML SOLN
75.0000 mL | Freq: Once | INTRAVENOUS | Status: AC | PRN
  Administered 2018-08-23: 75 mL via INTRAVENOUS

## 2018-08-23 NOTE — TOC Initial Note (Signed)
Transition of Care [facility]) - Initial/Assessment Note    Patient Details  Name: Linda Oliver MRN: [id number] Date of Birth: 01/08/1933  [facility] CM/SW Contact:    Pollie Friar, RN Phone Number: 08/23/2018, 3:48 PM  Clinical Narrative:                 Pt denies issues with transportation and home medications.  Therapy recommending intermittent supervision and daughter is able to provide this.  TOC following   Expected Discharge Plan: Home/Self Care Barriers to Discharge: Continued Medical Work up   Patient Goals and CMS Choice        Expected Discharge Plan and Services Expected Discharge Plan: Home/Self Care         Expected Discharge Date: 08/24/18                                    Prior Living Arrangements/Services   Lives with:: Self Patient language and need for interpreter reviewed:: Yes(no needs) Do you feel safe going back to the place where you live?: Yes      Need for Family Participation in Patient Care: No (Comment) Care giver support system in place?: Yes (comment)(daughter able to stay with her 24 hours if needed) Current home services: DME(states she has it all) Criminal Activity/Legal Involvement Pertinent to Current Situation/Hospitalization: No - Comment as needed  Activities of Daily Living Home Assistive Devices/Equipment: None ADL Screening (condition at time of admission) Patient's cognitive ability adequate to safely complete daily activities?: Yes Is the patient deaf or have difficulty hearing?: No Does the patient have difficulty seeing, even when wearing glasses/contacts?: Yes Does the patient have difficulty concentrating, remembering, or making decisions?: No Patient able to express need for assistance with ADLs?: Yes Does the patient have difficulty dressing or bathing?: No Independently performs ADLs?: Yes (appropriate for developmental age) Does the patient have difficulty walking or climbing stairs?:  No Weakness of Legs: None Weakness of Arms/Hands: None  Permission Sought/Granted                  Emotional Assessment Appearance:: Appears stated age Attitude/Demeanor/Rapport: Engaged Affect (typically observed): Accepting, Pleasant Orientation: : Oriented to Situation, Oriented to  Time, Oriented to Place, Oriented to Self   Psych Involvement: No (comment)  Admission diagnosis:  Cerebrovascular accident (CVA), unspecified mechanism ([place]) [I63.9] Patient Active Problem List   Diagnosis Date Noted  . Acute cerebrovascular accident (CVA) ([place]) 08/22/2018  . Autonomic orthostatic hypotension 02/15/2018  . Carotid stenosis, asymptomatic, bilateral 02/15/2018  . Subjective memory complaints 11/19/2017  . Hereditary and idiopathic peripheral neuropathy 02/19/2014  . Coronary artery disease without angina pectoris   . Type 2 diabetes mellitus with vascular disease ([place])   . Hyperlipidemia   . Hypertensive heart disease   . Obesity (BMI 30-39.9)   . GERD (gastroesophageal reflux disease)   . Bullous pemphigus   . Diabetic peripheral neuropathy ([place])   . H/O three vessel coronary artery bypass 03/30/1994   PCP:  Shon Baton, MD Pharmacy:   [place], [place] - [address] [place] [place] [address] Phone: [phone] Fax: [phone]  [facility] [place], [place] [address] [place] [address] Phone: [phone] Fax: [phone]     Social Determinants of Health (SDOH) Interventions    Readmission Risk Interventions No flowsheet  data found.

## 2018-08-23 NOTE — Progress Notes (Signed)
SLP Cancellation Note  Patient Details Name: Linda Oliver MRN: [id number] DOB: 04-Aug-1932   Cancelled treatment:       Reason Eval/Treat Not Completed: Patient at procedure or test/unavailable(Pt with MD at this time. SLP will follow up. )  [name] I. Hardin Negus, [place], [place] Office number [phone] Pager [place] 08/23/2018, 9:24 AM

## 2018-08-23 NOTE — Plan of Care (Signed)
  Problem: Education: Goal: Knowledge of disease or condition will improve Outcome: Progressing Goal: Knowledge of secondary prevention will improve Outcome: Progressing Goal: Knowledge of patient specific risk factors addressed and post discharge goals established will improve Outcome: Progressing   Problem: Coping: Goal: Will verbalize positive feelings about self Outcome: Progressing Goal: Will identify appropriate support needs Outcome: Progressing   Problem: Health Behavior/Discharge Planning: Goal: Ability to manage health-related needs will improve Outcome: Progressing   Problem: Self-Care: Goal: Ability to participate in self-care as condition permits will improve Outcome: Progressing Goal: Verbalization of feelings and concerns over difficulty with self-care will improve Outcome: Progressing Goal: Ability to communicate needs accurately will improve Outcome: Progressing   Problem: Nutrition: Goal: Risk of aspiration will decrease Outcome: Progressing Goal: Dietary intake will improve Outcome: Progressing   Problem: Ischemic Stroke/TIA Tissue Perfusion: Goal: Complications of ischemic stroke/TIA will be minimized Outcome: Progressing   Problem: Spontaneous Subarachnoid Hemorrhage Tissue Perfusion: Goal: Complications of Spontaneous Subarachnoid Hemorrhage will be minimized Outcome: Progressing

## 2018-08-23 NOTE — Progress Notes (Signed)
STROKE TEAM PROGRESS NOTE   INTERVAL HISTORY Pt sitting in bed, pleasant, stated that her symptoms all resolved. Neuro intact. Discussed with loop recorder and she is willing to proceed.   Vitals:   08/23/18 0118 08/23/18 0141 08/23/18 0318 08/23/18 0758  BP: (!) 161/61  (!) 166/57 (!) 187/58  Pulse: 79  77 72  Resp: 20  20 18   Temp: 97.7 F (36.5 C)  98.2 F (36.8 C) 99.4 F (37.4 C)  TempSrc: Axillary  Axillary Oral  SpO2: 94%  94% 93%  Weight:  78.8 kg    Height:  5' (1.524 m)      CBC:  Recent Labs  Lab 08/22/18 1351 08/22/18 2243 08/23/18 0434  WBC 9.0 9.6 8.6  NEUTROABS 5.6  --   --   HGB 11.3* 11.7* 11.1*  HCT 36.9 36.7 34.7*  MCV 84.2 82.8 82.4  PLT 242 258 751    Basic Metabolic Panel:  Recent Labs  Lab 08/22/18 1351 08/22/18 2243 08/23/18 0434  NA 137  --  142  K 3.5  --  3.7  CL 100  --  105  CO2 27  --  28  GLUCOSE 320*  --  150*  BUN 21  --  15  CREATININE 1.19* 1.04* 0.90  CALCIUM 8.4*  --  8.5*   Lipid Panel:     Component Value Date/Time   CHOL 127 08/23/2018 0434   TRIG 358 (H) 08/23/2018 0434   HDL 27 (L) 08/23/2018 0434   CHOLHDL 4.7 08/23/2018 0434   VLDL 72 (H) 08/23/2018 0434   LDLCALC 28 08/23/2018 0434   HgbA1c:  Lab Results  Component Value Date   HGBA1C 8.7 (H) 08/23/2018   Urine Drug Screen:     Component Value Date/Time   LABOPIA NONE DETECTED 12/26/2007 0030   COCAINSCRNUR NONE DETECTED 12/26/2007 0030   LABBENZ NONE DETECTED 12/26/2007 0030   AMPHETMU NONE DETECTED 12/26/2007 0030   THCU NONE DETECTED 12/26/2007 0030   LABBARB  12/26/2007 0030    NONE DETECTED        DRUG SCREEN FOR MEDICAL PURPOSES ONLY.  IF CONFIRMATION IS NEEDED FOR ANY PURPOSE, NOTIFY LAB WITHIN 5 DAYS.    Alcohol Level     Component Value Date/Time   [facility]  12/25/2007 1930    <5        LOWEST DETECTABLE LIMIT FOR SERUM ALCOHOL IS 5 mg/dL FOR MEDICAL PURPOSES ONLY    IMAGING Ct Angio Head W Or Wo Contrast  Result Date:  08/23/2018 CLINICAL DATA:  Follow-up occipital stroke EXAM: CT ANGIOGRAPHY HEAD AND NECK TECHNIQUE: Multidetector CT imaging of the head and neck was performed using the standard protocol during bolus administration of intravenous contrast. Multiplanar CT image reconstructions and MIPs were obtained to evaluate the vascular anatomy. Carotid stenosis measurements (when applicable) are obtained utilizing NASCET criteria, using the distal internal carotid diameter as the denominator. CONTRAST:  57mL OMNIPAQUE IOHEXOL 350 MG/ML SOLN COMPARISON:  Brain MRI from yesterday FINDINGS: CT HEAD FINDINGS Brain: The patient's occipital cortex infarct is not readily visualized by CT. Mild small vessel ischemic change and cerebral volume loss for age. No acute hemorrhage, hydrocephalus, or masslike finding Vascular: See below Skull: Negative Sinuses: Negative Orbits: Negative Review of the MIP images confirms the above findings CTA NECK FINDINGS Aortic arch: Atherosclerotic plaque. Partially visualized CABG grafts. There is irregular thrombus/plaque at the aortic isthmus along the superior to posterior walls, also involving the stenotic proximal left subclavian artery. Right  carotid system: Moderate calcified plaque at the bifurcation and ICA bulb with 35% stenosis. No ulceration or beading. Left carotid system: Partial retropharyngeal course of the ICA. Calcified plaque at the ICA bulb with up to 50% stenosis as measured on coronal reformats. No ulceration. Vertebral arteries: Irregular plaque/thrombus at the aortic isthmus also involves the left subclavian origin. Left subclavian origin stenosis quantification is limited by streak artifact but estimated at 75% stenosis as measured on coronal reformats. Mild atheromatous narrowing at the left vertebral origin. The vertebral arteries themselves are smooth and patent. There is less intense enhancement within the left vertebral artery, best seen at the V4 segment. Skeleton: Cervical  spine degeneration. Other neck: 2.9 cm right thyroid mass which is appropriate for sonographic follow-up. Upper chest: Negative Review of the MIP images confirms the above findings CTA HEAD FINDINGS Anterior circulation: Atherosclerotic plaque on the carotid siphons without stenosis or ulceration. Aplastic left A1 segment. No branch occlusion, beading, or aneurysm. Posterior circulation: Less intense enhancement of the left vertebral artery attributed to stenotic disease in the neck/chest. Left PCA branch occlusion correlating with the acute infarct. Venous sinuses: Patent Anatomic variants: As above Review of the MIP images confirms the above findings IMPRESSION: 1. Left subclavian origin high-grade stenosis with delayed enhancement of the distal left vertebral artery. Additionally, there is irregular plaque/mural thrombus involving the aortic isthmus and left subclavian origin. 2. Left PCA branch occlusion correlating with the known acute infarct. 3. Cervical carotid atherosclerosis with up to 50% stenosis at the left ICA bulb. 4. 3 cm right thyroid mass, recommend elective ultrasound. Electronically Signed   By: Monte Fantasia M.D.   On: 08/23/2018 10:28   Ct Angio Neck W Or Wo Contrast  Result Date: 08/23/2018 CLINICAL DATA:  Follow-up occipital stroke EXAM: CT ANGIOGRAPHY HEAD AND NECK TECHNIQUE: Multidetector CT imaging of the head and neck was performed using the standard protocol during bolus administration of intravenous contrast. Multiplanar CT image reconstructions and MIPs were obtained to evaluate the vascular anatomy. Carotid stenosis measurements (when applicable) are obtained utilizing NASCET criteria, using the distal internal carotid diameter as the denominator. CONTRAST:  49mL OMNIPAQUE IOHEXOL 350 MG/ML SOLN COMPARISON:  Brain MRI from yesterday FINDINGS: CT HEAD FINDINGS Brain: The patient's occipital cortex infarct is not readily visualized by CT. Mild small vessel ischemic change and  cerebral volume loss for age. No acute hemorrhage, hydrocephalus, or masslike finding Vascular: See below Skull: Negative Sinuses: Negative Orbits: Negative Review of the MIP images confirms the above findings CTA NECK FINDINGS Aortic arch: Atherosclerotic plaque. Partially visualized CABG grafts. There is irregular thrombus/plaque at the aortic isthmus along the superior to posterior walls, also involving the stenotic proximal left subclavian artery. Right carotid system: Moderate calcified plaque at the bifurcation and ICA bulb with 35% stenosis. No ulceration or beading. Left carotid system: Partial retropharyngeal course of the ICA. Calcified plaque at the ICA bulb with up to 50% stenosis as measured on coronal reformats. No ulceration. Vertebral arteries: Irregular plaque/thrombus at the aortic isthmus also involves the left subclavian origin. Left subclavian origin stenosis quantification is limited by streak artifact but estimated at 75% stenosis as measured on coronal reformats. Mild atheromatous narrowing at the left vertebral origin. The vertebral arteries themselves are smooth and patent. There is less intense enhancement within the left vertebral artery, best seen at the V4 segment. Skeleton: Cervical spine degeneration. Other neck: 2.9 cm right thyroid mass which is appropriate for sonographic follow-up. Upper chest: Negative Review of the MIP  images confirms the above findings CTA HEAD FINDINGS Anterior circulation: Atherosclerotic plaque on the carotid siphons without stenosis or ulceration. Aplastic left A1 segment. No branch occlusion, beading, or aneurysm. Posterior circulation: Less intense enhancement of the left vertebral artery attributed to stenotic disease in the neck/chest. Left PCA branch occlusion correlating with the acute infarct. Venous sinuses: Patent Anatomic variants: As above Review of the MIP images confirms the above findings IMPRESSION: 1. Left subclavian origin high-grade  stenosis with delayed enhancement of the distal left vertebral artery. Additionally, there is irregular plaque/mural thrombus involving the aortic isthmus and left subclavian origin. 2. Left PCA branch occlusion correlating with the known acute infarct. 3. Cervical carotid atherosclerosis with up to 50% stenosis at the left ICA bulb. 4. 3 cm right thyroid mass, recommend elective ultrasound. Electronically Signed   By: Monte Fantasia M.D.   On: 08/23/2018 10:28   Mr Brain Wo Contrast  Result Date: 08/22/2018 CLINICAL DATA:  New onset right visual field deficit beginning 2 days ago. EXAM: MRI HEAD WITHOUT CONTRAST TECHNIQUE: Multiplanar, multiecho pulse sequences of the brain and surrounding structures were obtained without intravenous contrast. COMPARISON:  MRI brain 04/17/2013 FINDINGS: Brain: Diffusion-weighted images demonstrate a linear 2 cm acute nonhemorrhagic infarct involving the medial left occipital lobe, likely impacting the visual cortex. No other acute infarct is present. T2 signal changes are associated with the areas of restricted diffusion. Moderate generalized atrophy is present. Moderate periventricular and subcortical T2 changes are noted bilaterally. A remote lacunar infarct is present in the right caudate head. Remote ischemic changes are noted in the thalamus and extend into the brainstem bilaterally. Cerebellum is unremarkable. The ventricles are of normal size. No significant extraaxial fluid collection is present. There is no significant change in the atrophy or matter disease. Vascular: Flow is present in the major intracranial arteries. Skull and upper cervical spine: The craniocervical junction is normal. Upper cervical spine is within normal limits. Marrow signal is unremarkable. Sinuses/Orbits: The paranasal sinuses and mastoid air cells are clear. Bilateral lens replacements are noted. Globes and orbits are otherwise unremarkable. IMPRESSION: 1. Acute/subacute nonhemorrhagic  infarct involving the medial left occipital lobe corresponds with the patient's recent visual changes. 2. Moderate atrophy and white matter disease is otherwise stable. Electronically Signed   By: San Morelle M.D.   On: 08/22/2018 18:32    PHYSICAL EXAM  Temp:  [97.7 F (36.5 C)-99.4 F (37.4 C)] 98.7 F (37.1 C) (08/13 1721) Pulse Rate:  [72-82] 82 (08/13 1721) Resp:  [14-22] 14 (08/13 1721) BP: (161-203)/(50-82) 203/60 (08/13 1721) SpO2:  [91 %-95 %] 91 % (08/13 1721) Weight:  [78.8 kg] 78.8 kg (08/13 0141)  General - Well nourished, well developed, in no apparent distress.  Ophthalmologic - fundi not visualized due to noncooperation.  Cardiovascular - Regular rate and rhythm.  Mental Status -  Level of arousal and orientation to time, place, and person were intact. Language including expression, naming, repetition, comprehension was assessed and found intact. Attention span and concentration were normal. Fund of Knowledge was assessed and was intact.  Cranial Nerves II - XII - II - Visual field intact OU. III, IV, VI - Extraocular movements intact. V - Facial sensation intact bilaterally. VII - Facial movement intact bilaterally. VIII - Hearing & vestibular intact bilaterally. X - Palate elevates symmetrically. XI - Chin turning & shoulder shrug intact bilaterally. XII - Tongue protrusion intact.  Motor Strength - The patient's strength was normal in all extremities and pronator drift was absent.  Bulk was normal and fasciculations were absent.   Motor Tone - Muscle tone was assessed at the neck and appendages and was normal.  Reflexes - The patient's reflexes were symmetrical in all extremities and she had no pathological reflexes.  Sensory - Light touch, temperature/pinprick were assessed and were symmetrical.    Coordination - The patient had normal movements in the hands with no ataxia or dysmetria.  Tremor was absent.  Gait and Station -  deferred.   ASSESSMENT/PLAN Linda Oliver is a 85 y.o. female with history of DM, diabetic peripheral neuropathy, migraine headaches, HTN, HLD, OSA, autonomic orthostatic hypotension, asymptomatic carotid stenosis, prior seizure and CAD with prior CABG presenting from opthalmology office with partial R eye vision loss.   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 Large vessel disease vs. cardioembolic source.  MRI Acute/subacute Medial L occipital infact. Small vessel disease. Atrophy.   CTA head & neck L subclavian high grade stenosis w/ irregular plaque/mural thrombus aortic isthmus and L subclavian origin. L PCA branch occlusion. L ICA 50% stenosis in cervical bulb.    LE Doppler  No DVT  2D Echo EF 60-65%  Recommend loop recorder to rule out afib  LDL 28  HgbA1c 8.7  Lovenox 40 mg sq daily for VTE prophylaxis  aspirin 325 mg daily prior to admission, now on aspirin 325 mg daily and clopidogrel 75 mg daily given left PCA occlusion. Continue DAPT for 3 months and then plavix alone.   Therapy recommendations:  No PT  Disposition:  pending   Hypertension Autonomic orthostatic hypotension  Stable . Permissive hypertension (OK if < 220/120) but gradually normalize in 3-5 days . Long-term BP goal normotensive  Hyperlipidemia  Home meds:  crestor 10, resumed in hospital then stopped d/t low LDL and added zetia 10  LDL 28, goal < 70  Elevated TG 358  Continue zetia at discharge  Diabetes type II Uncontrolled Diabetic Peripheral Neuropathy  HgbA1c 8.7, goal < 7.0  CBGs  SSI  Close PCP follow up for better DM control.  Other Stroke Risk Factors  Advanced age  Former Cigarette smoker, quit 31 yrs ago  Obesity, Body mass index is 33.93 kg/m., recommend weight loss, diet and exercise as appropriate   Coronary artery disease s/p CABG  Migraines  Obstructive sleep apnea, no on CPAP at home,uses O2 at hs  Other Active  Problems  GERD on PPI  Cognitive decline on namenda and aricept  Hospital day # 1  Rosalin Hawking, MD PhD Stroke Neurology 08/23/2018 6:20 PM    To contact Stroke Continuity provider, please refer to [email or web address] After hours, contact General Neurology

## 2018-08-23 NOTE — Progress Notes (Signed)
Lower extremity venous has been completed.   Preliminary results in CV Proc.   Abram Sander 08/23/2018 11:40 AM

## 2018-08-23 NOTE — Progress Notes (Signed)
2D Echocardiogram has been performed.  Linda Oliver 08/23/2018, 11:39 AM

## 2018-08-23 NOTE — Evaluation (Signed)
Physical Therapy Evaluation and Discharge Patient Details Name: Linda Oliver MRN: [id number] DOB: 06/10/1932 Today's Date: 08/23/2018   History of Present Illness  85 y.o. female with medical history significant of diabetes, hypertension, diabetic neuropathy, autonomic orthostatic hypotension, seizure, hyperlipidemia, coronary artery disease status post CABG, depression, osteopenia, Lt RTC repair, as well as anxiety disorder who suddenly lost vision in the right eye and right side of her vision on 08/20/18. MRI acute/subacute nonhemorrhagic infarct involving the medial left occipital lobe  Clinical Impression   Patient evaluated by Physical Therapy with no further acute PT needs identified. All education has been completed and the patient has no further questions. She is modified independent with basic mobility with RW (although she uses rollator at home). PT is signing off. Thank you for this referral.     Follow Up Recommendations No PT follow up;Supervision - Intermittent(as her daughter did PTA)    Equipment Recommendations  None recommended by PT    Recommendations for Other Services OT consult     Precautions / Restrictions Precautions Precautions: Fall      Mobility  Bed Mobility                  Transfers Overall transfer level: Independent Equipment used: Rolling walker (2 wheeled)                Ambulation/Gait Ambulation/Gait assistance: Modified independent (Device/Increase time) Gait Distance (Feet): 180 Feet Assistive device: Rolling walker (2 wheeled) Gait Pattern/deviations: WFL(Within Functional Limits)     General Gait Details: pt normally uses rollator (none available) and did well with 2 wheel RW  Stairs            Wheelchair Mobility    Modified Rankin (Stroke Patients Only) Modified Rankin (Stroke Patients Only) Pre-Morbid Rankin Score: Moderate disability Modified Rankin: Moderate disability     Balance                                  Standardized Balance Assessment Standardized Balance Assessment : Berg Balance Test Berg Balance Test Sit to Stand: Able to stand  independently using hands Standing Unsupported: Able to stand safely 2 minutes Sitting with Back Unsupported but Feet Supported on Floor or Stool: Able to sit safely and securely 2 minutes Stand to Sit: Sits safely with minimal use of hands Transfers: Able to transfer safely, minor use of hands Standing Unsupported with Eyes Closed: Able to stand 10 seconds safely Standing Ubsupported with Feet Together: Able to place feet together independently and stand 1 minute safely From Standing, Reach Forward with Outstretched Arm: Can reach forward >12 cm safely (5") From Standing Position, Pick up Object from Floor: Unable to pick up shoe, but reaches 2-5 cm (1-2") from shoe and balances independently From Standing Position, Turn to Look Behind Over each Shoulder: Looks behind from both sides and weight shifts well Turn 360 Degrees: Able to turn 360 degrees safely but slowly         Pertinent Vitals/Pain Pain Assessment: No/denies pain    Home Living Family/patient expects to be discharged to:: Private residence Living Arrangements: Alone Available Help at Discharge: Family;Available PRN/intermittently Type of Home: House(townhome) Home Access: Ramped entrance     Home Layout: One level Home Equipment: Avalon - 4 wheels;Cane - single point;Walker - 2 wheels;Grab bars - tub/shower;Grab bars - toilet;Shower seat;Wheelchair - manual      Prior Function Level of Independence: Independent  with assistive device(s)         Comments: uses rollator inside except for bathroom, uses cane there; daugther does grocery shopping; dtr helps with housework     Hand Dominance   Dominant Hand: Right    Extremity/Trunk Assessment   Upper Extremity Assessment Upper Extremity Assessment: Defer to OT evaluation    Lower Extremity  Assessment Lower Extremity Assessment: Overall WFL for tasks assessed    Cervical / Trunk Assessment Cervical / Trunk Assessment: Normal  Communication   Communication: HOH(has hearing aid)  Cognition Arousal/Alertness: Awake/alert Behavior During Therapy: WFL for tasks assessed/performed Overall Cognitive Status: Within Functional Limits for tasks assessed                                 General Comments: a&o x 4      General Comments General comments (skin integrity, edema, etc.): no episodes of imbalance; reports vision nearly fully returned    Exercises     Assessment/Plan    PT Assessment Patent does not need any further PT services  PT Problem List         PT Treatment Interventions      PT Goals (Current goals can be found in the Care Plan section)  Acute Rehab PT Goals Patient Stated Goal: [place]urn home PT Goal Formulation: All assessment and education complete, DC therapy    Frequency     Barriers to discharge        Co-evaluation               AM-PAC PT "6 Clicks" Mobility  Outcome Measure Help needed turning from your back to your side while in a flat bed without using bedrails?: None Help needed moving from lying on your back to sitting on the side of a flat bed without using bedrails?: None Help needed moving to and from a bed to a chair (including a wheelchair)?: None Help needed standing up from a chair using your arms (e.g., wheelchair or bedside chair)?: None Help needed to walk in hospital room?: None Help needed climbing 3-5 steps with a railing? : A Little 6 Click Score: 23    End of Session   Activity Tolerance: Patient tolerated treatment well Patient left: in chair;with call bell/phone within reach;with nursing/sitter in room Nurse Communication: Mobility status PT Visit Diagnosis: Other symptoms and signs involving the nervous system (E33.435)    Time: 6861-6837 PT Time Calculation (min) (ACUTE ONLY): 21  min   Charges:   PT Evaluation $PT Eval Low Complexity: 1 Low            Barry Brunner, PT      Rexanne Mano 08/23/2018, 10:49 AM

## 2018-08-23 NOTE — Evaluation (Signed)
Speech Language Pathology Evaluation Patient Details Name: Linda KINNAMON MRN: [id number] DOB: 1932/12/21 Today's Date: 08/23/2018 Time: 1914-7829 SLP Time Calculation (min) (ACUTE ONLY): 13 min  Problem List:  Patient Active Problem List   Diagnosis Date Noted  . Acute cerebrovascular accident (CVA) ([place]) 08/22/2018  . Autonomic orthostatic hypotension 02/15/2018  . Carotid stenosis, asymptomatic, bilateral 02/15/2018  . Subjective memory complaints 11/19/2017  . Hereditary and idiopathic peripheral neuropathy 02/19/2014  . Coronary artery disease without angina pectoris   . Type 2 diabetes mellitus with vascular disease ([place])   . Hyperlipidemia   . Hypertensive heart disease   . Obesity (BMI 30-39.9)   . GERD (gastroesophageal reflux disease)   . Bullous pemphigus   . Diabetic peripheral neuropathy ([place])   . H/O three vessel coronary artery bypass 03/30/1994   Past Medical History:  Past Medical History:  Diagnosis Date  . Anginal pain ([place])    occ  . Anxiety   . Broken ankle   . Bullous pemphigus   . CAD (coronary artery disease)    [place]   80% stenosis ostial Left main, 30% stenosis proximal LAD, 80% stenosis proximal RCA CABG  -  LIMA to LAD, SVG to int, SVG to [place], [place]   . Depression   . Diabetic peripheral neuropathy ([place])   . GERD (gastroesophageal reflux disease)   . Headache(784.0)    hx migraines  . Heart murmur    mvp  . Hyperlipidemia   . Hypertension   . Hypertensive heart disease   . Nasal bleeding    occ due to O2  . OA (osteoarthritis)   . Obesity (BMI 30-39.9)   . Obstructive sleep apnea on CPAP   . Osteopenia   . Peripheral neuropathy   . Seizure ([place])    2009  . Sleep apnea    dx 7yrs ago doesnt use cpap but uses O2 at nite 2L  . Stones in the urinary tract   . Type 2 diabetes mellitus with vascular disease ([place])   . UTI (lower urinary tract infection)    Past Surgical History:  Past Surgical History:   Procedure Laterality Date  . ABDOMINAL HYSTERECTOMY  70's  . CARDIAC CATHETERIZATION    . CARPAL TUNNEL RELEASE Bilateral   . CHOLECYSTECTOMY    . CORONARY ARTERY BYPASS GRAFT  96  . SHOULDER ARTHROSCOPY     rt  . SHOULDER ARTHROSCOPY WITH ROTATOR CUFF REPAIR AND SUBACROMIAL DECOMPRESSION  12/01/2011   Procedure: SHOULDER ARTHROSCOPY WITH ROTATOR CUFF REPAIR AND SUBACROMIAL DECOMPRESSION;  Surgeon: Nita Sells, MD;  Location: [facility];  Service: Orthopedics;  Laterality: Left;  shoulder debridement calcific tendonitis   HPI:  Pt is an 85 y.o. female with medical history significant of diabetes, hypertension, diabetic neuropathy, hyperlipidemia, coronary artery disease status post CABG, depression, GERD, and anxiety disorder who suddenly lost vision in the right eye and right side of her vision on 08/20/18 and was advised by her opthalmologist to go to the ED for stroke work up. MRI of the brain revealed acute/subacute nonhemorrhagic infarct involving the medial left occipital lobe.     Assessment / Plan / Recommendation Clinical Impression  Pt reported that she was living independently prior to admission. She stated that she was diagnosed with Alzheimer's disease approximately three years prior by her neurologist. She stated that she has noted some occasional forgetfulness due to this but no significant difficulty but indicated that she has still been able  to manage her finances. She denied any acute changes in speech, language or cognition. Her speech and language skills are currently within normal limits and no overt cognitive-linguistic deficits were noted. Further skilled SLP services are not clinically indicated at this time. Pt and nursing were educated regarding this and both parties verbalized understanding as well as agreement with plan of care.    SLP Assessment  SLP Recommendation/Assessment: Patient needs continued Speech Lanaguage Pathology Services SLP Visit Diagnosis:  Cognitive communication deficit (R41.841)    Follow Up Recommendations  None    Frequency and Duration           SLP Evaluation Cognition  Overall Cognitive Status: Within Functional Limits for tasks assessed Arousal/Alertness: Awake/alert Orientation Level: Oriented X4 Attention: Focused;Sustained Focused Attention: Appears intact Sustained Attention: Appears intact Memory: Appears intact(Immediate: 3/3; delayed: 3/3) Awareness: Appears intact Problem Solving: Appears intact(5/5) Executive Function: Reasoning Reasoning: Appears intact(Abstraction: 2/2)       Comprehension  Auditory Comprehension Overall Auditory Comprehension: Appears within functional limits for tasks assessed Yes/No Questions: Within Functional Limits Basic Biographical Questions: (5/5) Complex Questions: (5/5) Paragraph Comprehension (via yes/no questions): (3/4) Commands: Within Functional Limits Two Step Basic Commands: (4/4) Multistep Basic Commands: (3/4) Conversation: Complex Visual Recognition/Discrimination Discrimination: Within Function Limits    Expression Expression Primary Mode of Expression: Verbal Verbal Expression Overall Verbal Expression: Appears within functional limits for tasks assessed Initiation: No impairment Automatic Speech: Counting;Day of week;Month of year(WNL) Level of Generative/Spontaneous Verbalization: Conversation Repetition: No impairment Naming: No impairment Responsive: (5/5) Confrontation: Within functional limits(10/10) Convergent: (5/5) Pragmatics: No impairment Written Expression Dominant Hand: Right   Oral / Motor  Oral Motor/Sensory Function Overall Oral Motor/Sensory Function: Within functional limits Motor Speech Overall Motor Speech: Appears within functional limits for tasks assessed Respiration: Within functional limits Phonation: Normal Resonance: Within functional limits Articulation: Within functional limitis Intelligibility:  Intelligible Motor Planning: Witnin functional limits Motor Speech Errors: Not applicable   Linda Oliver. Hardin Negus, [place], [place] Office number [phone] Pager [place] 08/23/2018, 11:56 AM

## 2018-08-23 NOTE — Evaluation (Signed)
Occupational Therapy Evaluation Patient Details Name: Linda Oliver MRN: [id number] DOB: [birth year] Today's Date: 08/23/2018    History of Present Illness 85 y.o. female with medical history significant of diabetes, hypertension, diabetic neuropathy, autonomic orthostatic hypotension, seizure, hyperlipidemia, coronary artery disease status post CABG, depression, osteopenia, Lt RTC repair, as well as anxiety disorder who suddenly lost vision in the right eye and right side of her vision on 08/20/18. MRI acute/subacute nonhemorrhagic infarct involving the medial left occipital lobe   Clinical Impression   Pt PTA: Living at home alone, family is supportive. Pt using rollator for mobility and independent with ADL and daughter assisting with IADL. Pt currently performing ADL tasks with modified independence. Pt able to read from magazine fluidly with eye glasses on and no difficulty. Vision has mostly resolved. With pt's L eye closed, pt with R peripheral small deficits and slightly impaired depth perception. Pt ambulating and performing own ADL without assist and no visual deficits inhibiting accuracy for activities.  Pt advised to move head and body if unsure of surroundings. Pt with no LOB episodes. Pt ambulating well with no difficulty. Pt does not require continued OT as pt feels lose to her baseline. OT signing off.     Follow Up Recommendations  No OT follow up    Equipment Recommendations  None recommended by OT    Recommendations for Other Services       Precautions / Restrictions Precautions Precautions: Fall Restrictions Weight Bearing Restrictions: No      Mobility Bed Mobility Overal bed mobility: Modified Independent                Transfers Overall transfer level: Independent Equipment used: None                  Balance Overall balance assessment: Modified Independent                               Standardized Balance  Assessment Standardized Balance Assessment : Berg Balance Test Berg Balance Test Sit to Stand: Able to stand  independently using hands Standing Unsupported: Able to stand safely 2 minutes Sitting with Back Unsupported but Feet Supported on Floor or Stool: Able to sit safely and securely 2 minutes Stand to Sit: Sits safely with minimal use of hands Transfers: Able to transfer safely, minor use of hands Standing Unsupported with Eyes Closed: Able to stand 10 seconds safely Standing Ubsupported with Feet Together: Able to place feet together independently and stand 1 minute safely From Standing, Reach Forward with Outstretched Arm: Can reach forward >12 cm safely (5") From Standing Position, Pick up Object from Floor: Unable to pick up shoe, but reaches 2-5 cm (1-2") from shoe and balances independently From Standing Position, Turn to Look Behind Over each Shoulder: Looks behind from both sides and weight shifts well Turn 360 Degrees: Able to turn 360 degrees safely but slowly       ADL either performed or assessed with clinical judgement   ADL Overall ADL's : At baseline                                       General ADL Comments: Vision has improved. Pt reading frm magazine with glasses. Pt performing ADL with modified independence.     Vision Baseline Vision/History: Wears glasses Wears Glasses: At all times Patient  Visual Report: Other (comment)(minimal impairment R eye mostly depth and peripheral vision) Vision Assessment?: Yes Eye Alignment: Within Functional Limits Ocular Range of Motion: Within Functional Limits Alignment/Gaze Preference: Within Defined Limits Tracking/Visual Pursuits: Requires cues, head turns, or add eye shifts to track(for peripheral vision in R eye) Saccades: Within functional limits Convergence: Within functional limits Additional Comments: Pt able to read from magazine fluidly with eye glasses on and no difficulty. Pt with L eye closed,  pt with R peripheral small deficits and slightly impaired depth perception.     Perception     Praxis      Pertinent Vitals/Pain Pain Assessment: No/denies pain     Hand Dominance Right   Extremity/Trunk Assessment Upper Extremity Assessment Upper Extremity Assessment: Overall WFL for tasks assessed   Lower Extremity Assessment Lower Extremity Assessment: Overall WFL for tasks assessed   Cervical / Trunk Assessment Cervical / Trunk Assessment: Normal   Communication Communication Communication: HOH   Cognition Arousal/Alertness: Awake/alert Behavior During Therapy: WFL for tasks assessed/performed Overall Cognitive Status: Within Functional Limits for tasks assessed                                 General Comments: a&o x 4   General Comments  Pt reports that her vision in R eye has mostly returned.    Exercises     Shoulder Instructions      Home Living Family/patient expects to be discharged to:: Private residence Living Arrangements: Alone Available Help at Discharge: Family;Available PRN/intermittently Type of Home: House Home Access: Ramped entrance     Home Layout: One level     Bathroom Shower/Tub: Occupational psychologist: Handicapped height Bathroom Accessibility: No   Home Equipment: Environmental consultant - 4 wheels;Cane - single point;Walker - 2 wheels;Grab bars - tub/shower;Grab bars - toilet;Shower seat;Wheelchair - manual      Lives With: Alone    Prior Functioning/Environment Level of Independence: Independent with assistive device(s)        Comments: uses rollator inside except for bathroom, uses cane there; daugther does grocery shopping; dtr helps with housework        OT Problem List: Impaired vision/perception      OT Treatment/Interventions:      OT Goals(Current goals can be found in the care plan section) Acute Rehab OT Goals Patient Stated Goal: return home  OT Frequency:     Barriers to D/C:             Co-evaluation              AM-PAC OT "6 Clicks" Daily Activity     Outcome Measure Help from another person eating meals?: None Help from another person taking care of personal grooming?: None Help from another person toileting, which includes using toliet, bedpan, or urinal?: None Help from another person bathing (including washing, rinsing, drying)?: A Little Help from another person to put on and taking off regular upper body clothing?: None Help from another person to put on and taking off regular lower body clothing?: None 6 Click Score: 23   End of Session Equipment Utilized During Treatment: Gait belt Nurse Communication: Mobility status  Activity Tolerance: Patient tolerated treatment well Patient left: in chair;with call bell/phone within reach  OT Visit Diagnosis: Unsteadiness on feet (R26.81);Muscle weakness (generalized) (M62.81)                Time: 3419-3790 OT Time Calculation (  min): 20 min Charges:  OT General Charges $OT Visit: 1 Visit OT Evaluation $OT Eval Moderate Complexity: 1 Mod  Darryl Nestle) Marsa Aris OTR/L Acute Rehabilitation Services Pager: [phone] Office: [phone]   Audie Pinto 08/23/2018, 1:25 PM

## 2018-08-23 NOTE — Progress Notes (Addendum)
PROGRESS NOTE    Linda Oliver  RKY:[id number] DOB: October 30, 1932 DOA: 08/22/2018 PCP: Shon Baton, MD   Brief Narrative:  Patient is 85 year old female with history of diabetes, hypertension, diabetic neuropathy, hyperlipidemia, coronary artery disease status post CABG, depression, GERD, anxiety disorder who presents to the emergency department with complaints of sudden loss of peripheral vision of the right eye.  Work-up in the emergency department showed acute CVA.  Patient admitted for further work-up.  Neurology following   Assessment & Plan:   Principal Problem:   Acute cerebrovascular accident (CVA) ([place]) Active Problems:   Coronary artery disease without angina pectoris   Type 2 diabetes mellitus with vascular disease (HCC)   Hyperlipidemia   Hypertensive heart disease   GERD (gastroesophageal reflux disease)   Diabetic peripheral neuropathy (HCC)   Hereditary and idiopathic peripheral neuropathy   Carotid stenosis, asymptomatic, bilateral   Acute CVA: Presented with peripheral loss of vision of the right eye.  MRI of the brain showed acute/subacute nonhemorrhagic infarct involving the medial left occipital lobe. CTA neck/head showed left subclavian origin high-grade stenosis,irregular plaque/mural thrombus involving the aortic isthmus and left subclavian origin,left PCA branch occlusion . Pending echocardiogram.  Venous Doppler pending Hemoglobin A1c of 8.7.  LDL of 28 Neurology following. Currently on aspirin and plavix.  Continue Zetia PT/OT evaluation done.  No recommendations suggested.  Diabetes type 2: Continue sliding scale insulin for now.  Continue home regimen on discharge.  Hemoglobin A1c of 8.7  Hypertension: Allow permissive hypertension.  Gradually normalize blood pressure in 5 to 7 days.  Use PRN meds for blood pressure more than 315 systolic or diastolic more than 176 mmHg  Coronary artery disease: Currently stable.  Continue current  medications  Hyperlipidemia: Continue Zetia  Peripheral neuropathy: Continue home regimen  GERD: Continue PPI  Dementia: She has history of early dementia. She follows with neurology.  Thyroid mass: CTA head and neck showed 3 cm right thyroid mass.  We recommend ultrasound of the thyroid done as an outpatient         DVT prophylaxis: Lovenox Code Status: Full Family Communication: None Disposition Plan: DC home tomorrow   Consultants: neurology  Procedures:None Antimicrobials:  Anti-infectives (From admission, onward)   None      Subjective:    Patient seen and examined the bedside this morning.  Hemodynamically stable.  Comfortable.  Her loss of peripheral vision on the right eye improved currently she does not have any focal neurological deficits.  Objective: Vitals:   08/23/18 0141 08/23/18 0318 08/23/18 0758 08/23/18 1221  BP:  (!) 166/57 (!) 187/58 (!) 179/54  Pulse:  77 72 72  Resp:  20 18 17   Temp:  98.2 F (36.8 C) 99.4 F (37.4 C) 98.7 F (37.1 C)  TempSrc:  Axillary Oral Oral  SpO2:  94% 93% 93%  Weight: 78.8 kg     Height: 5' (1.524 m)       Intake/Output Summary (Last 24 hours) at 08/23/2018 1418 Last data filed at 08/23/2018 1411 Gross per 24 hour  Intake 1320.58 ml  Output --  Net 1320.58 ml   Filed Weights   08/23/18 0141  Weight: 78.8 kg    Examination:  General exam: Appears calm and comfortable ,Not in distress,pleaseant elderly female HEENT:PERRL,Oral mucosa moist, Ear/Nose normal on gross exam Respiratory system: Bilateral equal air entry, normal vesicular breath sounds, no wheezes or crackles  Cardiovascular system: S1 & S2 heard, RRR. No JVD, murmurs, rubs, gallops or clicks. No pedal  edema. Gastrointestinal system: Abdomen is nondistended, soft and nontender. No organomegaly or masses felt. Normal bowel sounds heard. Central nervous system: Alert and oriented. No focal neurological deficits. Extremities: No edema, no  clubbing ,no cyanosis, distal peripheral pulses palpable. Skin: No rashes, lesions or ulcers,no icterus ,no pallor MSK: Normal muscle bulk,tone ,power Psychiatry: Judgement and insight appear normal. Mood & affect appropriate.     Data Reviewed: I have personally reviewed following labs and imaging studies  CBC: Recent Labs  Lab 08/22/18 1351 08/22/18 2243 08/23/18 0434  WBC 9.0 9.6 8.6  NEUTROABS 5.6  --   --   HGB 11.3* 11.7* 11.1*  HCT 36.9 36.7 34.7*  MCV 84.2 82.8 82.4  PLT 242 258 409   Basic Metabolic Panel: Recent Labs  Lab 08/22/18 1351 08/22/18 2243 08/23/18 0434  NA 137  --  142  K 3.5  --  3.7  CL 100  --  105  CO2 27  --  28  GLUCOSE 320*  --  150*  BUN 21  --  15  CREATININE 1.19* 1.04* 0.90  CALCIUM 8.4*  --  8.5*   GFR: Estimated Creatinine Clearance: 44 mL/min (by C-G formula based on SCr of 0.9 mg/dL). Liver Function Tests: Recent Labs  Lab 08/22/18 1351 08/23/18 0434  AST 17 15  ALT 13 12  ALKPHOS 111 100  BILITOT 0.5 0.4  PROT 6.6 6.0*  ALBUMIN 3.2* 2.9*   No results for input(s): LIPASE, AMYLASE in the last 168 hours. No results for input(s): AMMONIA in the last 168 hours. Coagulation Profile: Recent Labs  Lab 08/22/18 1351  INR 1.1   Cardiac Enzymes: No results for input(s): CKTOTAL, CKMB, CKMBINDEX, TROPONINI in the last 168 hours. BNP (last 3 results) No results for input(s): PROBNP in the last 8760 hours. HbA1C: Recent Labs    08/22/18 2243 08/23/18 0434  HGBA1C 8.7* 8.7*   CBG: Recent Labs  Lab 08/22/18 2242 08/23/18 0624 08/23/18 1129  GLUCAP 216* 118* 222*   Lipid Profile: Recent Labs    08/23/18 0434  CHOL 127  HDL 27*  LDLCALC 28  TRIG 358*  CHOLHDL 4.7   Thyroid Function Tests: No results for input(s): TSH, T4TOTAL, FREET4, T3FREE, THYROIDAB in the last 72 hours. Anemia Panel: No results for input(s): VITAMINB12, FOLATE, FERRITIN, TIBC, IRON, RETICCTPCT in the last 72 hours. Sepsis Labs: No  results for input(s): PROCALCITON, LATICACIDVEN in the last 168 hours.  Recent Results (from the past 240 hour(s))  SARS CORONAVIRUS 2 Nasal Swab Aptima Multi Swab     Status: None   Collection Time: 08/22/18  6:42 PM   Specimen: Aptima Multi Swab; Nasal Swab  Result Value Ref Range Status   SARS Coronavirus 2 NEGATIVE NEGATIVE Final    Comment: (NOTE) SARS-CoV-2 target nucleic acids are NOT DETECTED. The SARS-CoV-2 RNA is generally detectable in upper and lower respiratory specimens during the acute phase of infection. Negative results do not preclude SARS-CoV-2 infection, do not rule out co-infections with other pathogens, and should not be used as the sole basis for treatment or other patient management decisions. Negative results must be combined with clinical observations, patient history, and epidemiological information. The expected result is Negative. Fact Sheet for Patients: SugarRoll.be Fact Sheet for Healthcare Providers: https://www.woods-mathews.com/ This test is not yet approved or cleared by the [place] FDA and  has been authorized for detection and/or diagnosis of SARS-CoV-2 by FDA under an Emergency Use Authorization (EUA). This EUA will remain  in  effect (meaning this test can be used) for the duration of the COVID-19 declaration under Section 56 4(b)(1) of the Act, 21 U.S.C. section 360bbb-3(b)(1), unless the authorization is terminated or revoked sooner. Performed at [facility], [place] [address] [place],  [address]          Radiology Studies: Ct Angio Head W Or Wo Contrast  Result Date: 08/23/2018 CLINICAL DATA:  Follow-up occipital stroke EXAM: CT ANGIOGRAPHY HEAD AND NECK TECHNIQUE: Multidetector CT imaging of the head and neck was performed using the standard protocol during bolus administration of intravenous contrast. Multiplanar CT image reconstructions and MIPs were obtained to evaluate  the vascular anatomy. Carotid stenosis measurements (when applicable) are obtained utilizing NASCET criteria, using the distal internal carotid diameter as the denominator. CONTRAST:  33mL OMNIPAQUE IOHEXOL 350 MG/ML SOLN COMPARISON:  Brain MRI from yesterday FINDINGS: CT HEAD FINDINGS Brain: The patient's occipital cortex infarct is not readily visualized by CT. Mild small vessel ischemic change and cerebral volume loss for age. No acute hemorrhage, hydrocephalus, or masslike finding Vascular: See below Skull: Negative Sinuses: Negative Orbits: Negative Review of the MIP images confirms the above findings CTA NECK FINDINGS Aortic arch: Atherosclerotic plaque. Partially visualized CABG grafts. There is irregular thrombus/plaque at the aortic isthmus along the superior to posterior walls, also involving the stenotic proximal left subclavian artery. Right carotid system: Moderate calcified plaque at the bifurcation and ICA bulb with 35% stenosis. No ulceration or beading. Left carotid system: Partial retropharyngeal course of the ICA. Calcified plaque at the ICA bulb with up to 50% stenosis as measured on coronal reformats. No ulceration. Vertebral arteries: Irregular plaque/thrombus at the aortic isthmus also involves the left subclavian origin. Left subclavian origin stenosis quantification is limited by streak artifact but estimated at 75% stenosis as measured on coronal reformats. Mild atheromatous narrowing at the left vertebral origin. The vertebral arteries themselves are smooth and patent. There is less intense enhancement within the left vertebral artery, best seen at the V4 segment. Skeleton: Cervical spine degeneration. Other neck: 2.9 cm right thyroid mass which is appropriate for sonographic follow-up. Upper chest: Negative Review of the MIP images confirms the above findings CTA HEAD FINDINGS Anterior circulation: Atherosclerotic plaque on the carotid siphons without stenosis or ulceration. Aplastic left  A1 segment. No branch occlusion, beading, or aneurysm. Posterior circulation: Less intense enhancement of the left vertebral artery attributed to stenotic disease in the neck/chest. Left PCA branch occlusion correlating with the acute infarct. Venous sinuses: Patent Anatomic variants: As above Review of the MIP images confirms the above findings IMPRESSION: 1. Left subclavian origin high-grade stenosis with delayed enhancement of the distal left vertebral artery. Additionally, there is irregular plaque/mural thrombus involving the aortic isthmus and left subclavian origin. 2. Left PCA branch occlusion correlating with the known acute infarct. 3. Cervical carotid atherosclerosis with up to 50% stenosis at the left ICA bulb. 4. 3 cm right thyroid mass, recommend elective ultrasound. Electronically Signed   By: Monte Fantasia M.D.   On: 08/23/2018 10:28   Ct Angio Neck W Or Wo Contrast  Result Date: 08/23/2018 CLINICAL DATA:  Follow-up occipital stroke EXAM: CT ANGIOGRAPHY HEAD AND NECK TECHNIQUE: Multidetector CT imaging of the head and neck was performed using the standard protocol during bolus administration of intravenous contrast. Multiplanar CT image reconstructions and MIPs were obtained to evaluate the vascular anatomy. Carotid stenosis measurements (when applicable) are obtained utilizing NASCET criteria, using the distal internal carotid diameter as the denominator. CONTRAST:  72mL OMNIPAQUE IOHEXOL 350 MG/ML SOLN COMPARISON:  Brain MRI from yesterday FINDINGS: CT HEAD FINDINGS Brain: The patient's occipital cortex infarct is not readily visualized by CT. Mild small vessel ischemic change and cerebral volume loss for age. No acute hemorrhage, hydrocephalus, or masslike finding Vascular: See below Skull: Negative Sinuses: Negative Orbits: Negative Review of the MIP images confirms the above findings CTA NECK FINDINGS Aortic arch: Atherosclerotic plaque. Partially visualized CABG grafts. There is irregular  thrombus/plaque at the aortic isthmus along the superior to posterior walls, also involving the stenotic proximal left subclavian artery. Right carotid system: Moderate calcified plaque at the bifurcation and ICA bulb with 35% stenosis. No ulceration or beading. Left carotid system: Partial retropharyngeal course of the ICA. Calcified plaque at the ICA bulb with up to 50% stenosis as measured on coronal reformats. No ulceration. Vertebral arteries: Irregular plaque/thrombus at the aortic isthmus also involves the left subclavian origin. Left subclavian origin stenosis quantification is limited by streak artifact but estimated at 75% stenosis as measured on coronal reformats. Mild atheromatous narrowing at the left vertebral origin. The vertebral arteries themselves are smooth and patent. There is less intense enhancement within the left vertebral artery, best seen at the V4 segment. Skeleton: Cervical spine degeneration. Other neck: 2.9 cm right thyroid mass which is appropriate for sonographic follow-up. Upper chest: Negative Review of the MIP images confirms the above findings CTA HEAD FINDINGS Anterior circulation: Atherosclerotic plaque on the carotid siphons without stenosis or ulceration. Aplastic left A1 segment. No branch occlusion, beading, or aneurysm. Posterior circulation: Less intense enhancement of the left vertebral artery attributed to stenotic disease in the neck/chest. Left PCA branch occlusion correlating with the acute infarct. Venous sinuses: Patent Anatomic variants: As above Review of the MIP images confirms the above findings IMPRESSION: 1. Left subclavian origin high-grade stenosis with delayed enhancement of the distal left vertebral artery. Additionally, there is irregular plaque/mural thrombus involving the aortic isthmus and left subclavian origin. 2. Left PCA branch occlusion correlating with the known acute infarct. 3. Cervical carotid atherosclerosis with up to 50% stenosis at the  left ICA bulb. 4. 3 cm right thyroid mass, recommend elective ultrasound. Electronically Signed   By: Monte Fantasia M.D.   On: 08/23/2018 10:28   Mr Brain Wo Contrast  Result Date: 08/22/2018 CLINICAL DATA:  New onset right visual field deficit beginning 2 days ago. EXAM: MRI HEAD WITHOUT CONTRAST TECHNIQUE: Multiplanar, multiecho pulse sequences of the brain and surrounding structures were obtained without intravenous contrast. COMPARISON:  MRI brain 04/17/2013 FINDINGS: Brain: Diffusion-weighted images demonstrate a linear 2 cm acute nonhemorrhagic infarct involving the medial left occipital lobe, likely impacting the visual cortex. No other acute infarct is present. T2 signal changes are associated with the areas of restricted diffusion. Moderate generalized atrophy is present. Moderate periventricular and subcortical T2 changes are noted bilaterally. A remote lacunar infarct is present in the right caudate head. Remote ischemic changes are noted in the thalamus and extend into the brainstem bilaterally. Cerebellum is unremarkable. The ventricles are of normal size. No significant extraaxial fluid collection is present. There is no significant change in the atrophy or matter disease. Vascular: Flow is present in the major intracranial arteries. Skull and upper cervical spine: The craniocervical junction is normal. Upper cervical spine is within normal limits. Marrow signal is unremarkable. Sinuses/Orbits: The paranasal sinuses and mastoid air cells are clear. Bilateral lens replacements are noted. Globes and orbits are otherwise unremarkable. IMPRESSION: 1. Acute/subacute nonhemorrhagic infarct involving the medial left  occipital lobe corresponds with the patient's recent visual changes. 2. Moderate atrophy and white matter disease is otherwise stable. Electronically Signed   By: San Morelle M.D.   On: 08/22/2018 18:32   Vas [place] Lower Extremity Venous (dvt)  Result Date: 08/23/2018  Lower Venous  Study Indications: Stroke.  Comparison Study: No prior Performing Technologist: Abram Sander RVS  Examination Guidelines: A complete evaluation includes B-mode imaging, spectral Doppler, color Doppler, and power Doppler as needed of all accessible portions of each vessel. Bilateral testing is considered an integral part of a complete examination. Limited examinations for reoccurring indications may be performed as noted.  +---------+---------------+---------+-----------+----------+-------+  RIGHT     Compressibility Phasicity Spontaneity Properties Summary  +---------+---------------+---------+-----------+----------+-------+  CFV       Full            Yes       Yes                             +---------+---------------+---------+-----------+----------+-------+  SFJ       Full                                                      +---------+---------------+---------+-----------+----------+-------+  FV Prox   Full                                                      +---------+---------------+---------+-----------+----------+-------+  FV Mid    Full                                                      +---------+---------------+---------+-----------+----------+-------+  FV Distal Full                                                      +---------+---------------+---------+-----------+----------+-------+  PFV       Full                                                      +---------+---------------+---------+-----------+----------+-------+  POP       Full            Yes       Yes                             +---------+---------------+---------+-----------+----------+-------+  PTV       Full                                                      +---------+---------------+---------+-----------+----------+-------+  PERO      Full                                                      +---------+---------------+---------+-----------+----------+-------+   +---------+---------------+---------+-----------+----------+-------+   LEFT      Compressibility Phasicity Spontaneity Properties Summary  +---------+---------------+---------+-----------+----------+-------+  CFV       Full            Yes       Yes                             +---------+---------------+---------+-----------+----------+-------+  SFJ       Full                                                      +---------+---------------+---------+-----------+----------+-------+  FV Prox   Full                                                      +---------+---------------+---------+-----------+----------+-------+  FV Mid    Full                                                      +---------+---------------+---------+-----------+----------+-------+  FV Distal Full                                                      +---------+---------------+---------+-----------+----------+-------+  PFV       Full                                                      +---------+---------------+---------+-----------+----------+-------+  POP       Full            Yes       Yes                             +---------+---------------+---------+-----------+----------+-------+  PTV       Full                                                      +---------+---------------+---------+-----------+----------+-------+  PERO      Full                                                      +---------+---------------+---------+-----------+----------+-------+  Summary: Right: There is no evidence of deep vein thrombosis in the lower extremity. No cystic structure found in the popliteal fossa. Left: There is no evidence of deep vein thrombosis in the lower extremity. No cystic structure found in the popliteal fossa.  *See table(s) above for measurements and observations.    Preliminary         Scheduled Meds:  aspirin EC  81 mg Oral Daily   busPIRone  7.5 mg Oral BID   clopidogrel  75 mg Oral Daily   donepezil  10 mg Oral Daily   DULoxetine  60 mg Oral Daily   enoxaparin (LOVENOX) injection  40 mg  Subcutaneous Q24H   ezetimibe  10 mg Oral Daily   gabapentin  300 mg Oral BID   hydrOXYzine  50 mg Oral QPM   insulin aspart  0-15 Units Subcutaneous TID WC   insulin aspart  0-5 Units Subcutaneous QHS   lactobacillus acidophilus  1 tablet Oral Daily   memantine  28 mg Oral Daily   metoprolol succinate  12.5 mg Oral BID   sodium chloride flush  3 mL Intravenous Once   cyanocobalamin  1,000 mcg Oral Daily   Continuous Infusions:   LOS: 1 day    Time spent: 35 mins.More than 50% of that time was spent in counseling and/or coordination of care.      Shelly Coss, MD [facility] Pager [phone]  If 7PM-7AM, please contact night-coverage [email or web address] Password [facility] 08/23/2018, 2:18 PM

## 2018-08-23 NOTE — Progress Notes (Signed)
Patient arrived in the unit at 2135pm from ED in a stretcher escorted by ED NA, walked to the bed from stretcher fine, alert and oriented x4, denied of pain, resting in a telemetry bed box 24, call bell and personal belongings are within reach, bed is locked, bed alarm is on, and will continue to monitor closely.

## 2018-08-24 ENCOUNTER — Encounter (HOSPITAL_COMMUNITY)
Admission: EM | Disposition: A | Discharge status: Home / Self Care | Payer: Self-pay | Source: Ambulatory Visit | Attending: Internal Medicine

## 2018-08-24 DIAGNOSIS — Z8673 Personal history of transient ischemic attack (TIA), and cerebral infarction without residual deficits: Secondary | ICD-10-CM

## 2018-08-24 DIAGNOSIS — I6389 Other cerebral infarction: Secondary | ICD-10-CM

## 2018-08-24 HISTORY — PX: LOOP RECORDER INSERTION: EP1214

## 2018-08-24 HISTORY — DX: Personal history of transient ischemic attack (TIA), and cerebral infarction without residual deficits: Z86.73

## 2018-08-24 LAB — GLUCOSE, CAPILLARY
Glucose-Capillary: 147 mg/dL — ABNORMAL HIGH (ref 70–99)
Glucose-Capillary: 214 mg/dL — ABNORMAL HIGH (ref 70–99)
Glucose-Capillary: 207 mg/dL — ABNORMAL HIGH (ref 70–99)

## 2018-08-24 SURGERY — LOOP RECORDER INSERTION

## 2018-08-24 MED ORDER — LIDOCAINE-EPINEPHRINE 1 %-1:100000 IJ SOLN
INTRAMUSCULAR | Status: AC
  Filled 2018-08-24: qty 1

## 2018-08-24 MED ORDER — LIDOCAINE HCL (PF) 1 % IJ SOLN
INTRAMUSCULAR | Status: DC | PRN
  Administered 2018-08-24: 10 mL

## 2018-08-24 MED ORDER — CLOPIDOGREL BISULFATE 75 MG PO TABS: 75.0000 mg | ORAL_TABLET | Freq: Every day | ORAL | 0 refills | Status: DC

## 2018-08-24 MED ORDER — EZETIMIBE 10 MG PO TABS: 10.0000 mg | ORAL_TABLET | Freq: Every day | ORAL | 0 refills | Status: DC

## 2018-08-24 SURGICAL SUPPLY — 2 items
PACK LOOP INSERTION (CUSTOM PROCEDURE TRAY) ×3 IMPLANT
SYSTEM MONITOR REVEAL LINQ II (Prosthesis & Implant Heart) ×2 IMPLANT

## 2018-08-24 NOTE — Progress Notes (Signed)
Pt back to unit from loop implant; pt alert and verbally responsive; pt loop site dsg remain intact with small serosanguinous stain; site marked; vss; pt back in bed with call light within reach; will continue to closely monitor. Delia Heady RN   08/24/18 1405  Vitals  Temp 98.9 F (37.2 C)  Temp Source Oral  BP (!) 142/51  MAP (mmHg) 78  BP Location Left Arm  BP Method Automatic  Patient Position (if appropriate) Lying  Pulse Rate 70  Pulse Rate Source Monitor  Resp 18  Oxygen Therapy  SpO2 92 %  O2 Device Room Air  MEWS Score  MEWS RR 0  MEWS Pulse 0  MEWS Systolic 0  MEWS LOC 0  MEWS Temp 0  MEWS Score 0  MEWS Score Color Green

## 2018-08-24 NOTE — Progress Notes (Signed)
Pt discharge education and instructions completed with pt at bedside; pt IV and telemetry removed; pt discharged home with son in-law to come pick up to transport home. Pt to pick up electronically sent prescription from preferred pharmacy on file. Pt transport off unit wheelchair with belonging to the side. Delia Heady RN

## 2018-08-24 NOTE — Discharge Instructions (Signed)
Post implant wound care instructions Keep incision clean and dry for 3 days. You can remove outer dressing tomorrow. Leave steri-strips (little pieces of tape) on until seen in the office for wound check appointment. Call the office [phone]) for redness, drainage, swelling, or fever.

## 2018-08-24 NOTE — Discharge Summary (Signed)
Physician Discharge Summary  KYNDEL EGGER ZOX:[id number] DOB: 12-14-1932 DOA: 08/22/2018  PCP: Shon Baton, MD  Admit date: 08/22/2018 Discharge date: 08/24/2018  Admitted From: Home Disposition:  Home  Discharge Condition:Stable CODE STATUS:FULL Diet recommendation: Heart Healthy  Brief/Interim Summary: Patient is 85 year old female with history of diabetes, hypertension, diabetic neuropathy, hyperlipidemia, coronary artery disease status post CABG, depression, GERD, anxiety disorder who presents to the emergency department with complaints of sudden loss of peripheral vision of the right eye.  Work-up in the emergency department showed acute CVA.  Patient admitted for further work-up.  Neurology was following.  Her work-up has been completed.  She also underwent loop recorder placement.  She is hemodynamically stable for discharge to home today.  Following problems were addressed during her hospitalization:  Acute CVA: Presented with peripheral loss of vision of the right eye.  MRI of the brain showed acute/subacute nonhemorrhagic infarct involving the medial left occipital lobe.  Her peripheral loss of vision of the right eye has improved significantly during this hospitalization. CTA neck/head showed left subclavian origin high-grade stenosis,irregular plaque/mural thrombus involving the aortic isthmus and left subclavian origin,left PCA branch occlusion . Echocardiogram showed ejection fraction of 60 to 65%, no source of embolus. Venous Doppler did not show any DVTs on the lower extremities Hemoglobin A1c of 8.7.  LDL of 28 Neurology was following. Currently on aspirin and plavix.  Plan is to continue aspirin 325 mg and Plavix 75 mg for 3 months followed by Plavix alone.  Continue Zetia PT/OT evaluation done.  No recommendations suggested. She will follow-up with her own neurologist as an outpatient.  Diabetes type 2:  Continue home regimen on discharge.  Hemoglobin A1c of 8.7.  Close  follow-up with PCP recommended for the control of her diabetes.  Hypertension:  Continue home antihypertensives.  Coronary artery disease: Currently stable.  Continue current medications  Hyperlipidemia: Continue Zetia  Peripheral neuropathy: Continue home regimen  GERD: Continue PPI  Dementia: She has history of early dementia. She follows with neurology.  Thyroid mass: CTA head and neck showed 3 cm right thyroid mass.  We recommend ultrasound of the thyroid done as an outpatient    Discharge Diagnoses:  Principal Problem:   Acute cerebrovascular accident (CVA) ([place]) Active Problems:   Coronary artery disease without angina pectoris   Type 2 diabetes mellitus with vascular disease ([place])   Hyperlipidemia   Hypertensive heart disease   GERD (gastroesophageal reflux disease)   Diabetic peripheral neuropathy ([facility])   Hereditary and idiopathic peripheral neuropathy   Carotid stenosis, asymptomatic, bilateral    Discharge Instructions  Discharge Instructions    Diet - low sodium heart healthy   Complete by: As directed    Discharge instructions   Complete by: As directed    1)Please follow up with your PCP in a week. 2)Follow up with your neurologist in 4 weeks. 3)Take prescribed medications as instructed.  Continue Aspirin 325 mg  and  Plavix 75 mg  for 3 months and then continue plavix alone. 4)You will be called by cardiology office for the follow up. 5)CT scan of  head and neck showed incidental finding of 3 cm right thyroid mass.  We recommend ultrasound of the thyroid done as an outpatient thorough the referral of your PCP.   Increase activity slowly   Complete by: As directed      Allergies as of 08/24/2018      Reactions   Percocet [oxycodone-acetaminophen] Nausea And Vomiting   Latex Itching, Rash  EKG leads      Medication List    STOP taking these medications   rosuvastatin 10 MG tablet Commonly known as: CRESTOR     TAKE these medications    acetaminophen 500 MG tablet Commonly known as: TYLENOL Take 500 mg by mouth every 4 (four) hours as needed for moderate pain.   amoxicillin 500 MG capsule Commonly known as: AMOXIL Take 2,000 mg by mouth See admin instructions. Take 2,000 mg by mouth one hour prior to dental appointments or procedures- as directed   aspirin 325 MG tablet Take 325 mg by mouth at bedtime.   busPIRone 7.5 MG tablet Commonly known as: BUSPAR Take 7.5 mg by mouth 2 (two) times daily.   clopidogrel 75 MG tablet Commonly known as: PLAVIX Take 1 tablet (75 mg total) by mouth daily. Start taking on: August 25, 2018   cyanocobalamin 1000 MCG tablet Take 1,000 mcg by mouth daily.   DULoxetine 60 MG capsule Commonly known as: CYMBALTA Take 60 mg by mouth daily.   ezetimibe 10 MG tablet Commonly known as: ZETIA Take 1 tablet (10 mg total) by mouth daily. Start taking on: August 25, 2018   fluticasone 50 MCG/ACT nasal spray Commonly known as: FLONASE Place 1 spray into both nostrils 2 (two) times daily as needed for allergies or rhinitis.   gabapentin 300 MG capsule Commonly known as: NEURONTIN Take 300 mg by mouth 2 (two) times daily.   glimepiride 1 MG tablet Commonly known as: AMARYL Take 1 mg by mouth daily.   hydrOXYzine 25 MG tablet Commonly known as: ATARAX/VISTARIL Take 50 mg by mouth every evening.   lactobacillus acidophilus Tabs tablet Take 1 tablet by mouth daily.   Lantus SoloStar 100 UNIT/ML Solostar Pen Generic drug: Insulin Glargine Inject 37 Units into the skin daily before breakfast.   metoprolol succinate 25 MG 24 hr tablet Commonly known as: TOPROL-XL Take 12.5 mg by mouth 2 (two) times daily.   Namzaric 28-10 MG Cp24 Generic drug: Memantine HCl-Donepezil HCl TAKE 1 CAPSULE DAILY What changed: when to take this   pantoprazole 40 MG tablet Commonly known as: PROTONIX Take 40 mg by mouth every evening.   SINEX REGULAR NA Place 1 spray into both nostrils as  needed (for congestion).   sitaGLIPtin 100 MG tablet Commonly known as: JANUVIA Take 100 mg by mouth daily.   Toviaz 8 MG Tb24 tablet Generic drug: fesoterodine Take 8 mg by mouth every evening.   valsartan-hydrochlorothiazide 160-25 MG tablet Commonly known as: DIOVAN-HCT Take 0.5 tablets by mouth daily.      Follow-up Information    Shon Baton, MD. Schedule an appointment as soon as possible for a visit in 1 week(s).   Specialty: Internal Medicine Contact information: [place] [address] [phone]          Allergies  Allergen Reactions  . Percocet [Oxycodone-Acetaminophen] Nausea And Vomiting  . Latex Itching and Rash    EKG leads    Consultations:  Neurology   Procedures/Studies: Ct Angio Head W Or Wo Contrast  Result Date: 08/23/2018 CLINICAL DATA:  Follow-up occipital stroke EXAM: CT ANGIOGRAPHY HEAD AND NECK TECHNIQUE: Multidetector CT imaging of the head and neck was performed using the standard protocol during bolus administration of intravenous contrast. Multiplanar CT image reconstructions and MIPs were obtained to evaluate the vascular anatomy. Carotid stenosis measurements (when applicable) are obtained utilizing NASCET criteria, using the distal internal carotid diameter as the denominator. CONTRAST:  74mL OMNIPAQUE IOHEXOL 350  MG/ML SOLN COMPARISON:  Brain MRI from yesterday FINDINGS: CT HEAD FINDINGS Brain: The patient's occipital cortex infarct is not readily visualized by CT. Mild small vessel ischemic change and cerebral volume loss for age. No acute hemorrhage, hydrocephalus, or masslike finding Vascular: See below Skull: Negative Sinuses: Negative Orbits: Negative Review of the MIP images confirms the above findings CTA NECK FINDINGS Aortic arch: Atherosclerotic plaque. Partially visualized CABG grafts. There is irregular thrombus/plaque at the aortic isthmus along the superior to posterior walls, also involving the stenotic  proximal left subclavian artery. Right carotid system: Moderate calcified plaque at the bifurcation and ICA bulb with 35% stenosis. No ulceration or beading. Left carotid system: Partial retropharyngeal course of the ICA. Calcified plaque at the ICA bulb with up to 50% stenosis as measured on coronal reformats. No ulceration. Vertebral arteries: Irregular plaque/thrombus at the aortic isthmus also involves the left subclavian origin. Left subclavian origin stenosis quantification is limited by streak artifact but estimated at 75% stenosis as measured on coronal reformats. Mild atheromatous narrowing at the left vertebral origin. The vertebral arteries themselves are smooth and patent. There is less intense enhancement within the left vertebral artery, best seen at the V4 segment. Skeleton: Cervical spine degeneration. Other neck: 2.9 cm right thyroid mass which is appropriate for sonographic follow-up. Upper chest: Negative Review of the MIP images confirms the above findings CTA HEAD FINDINGS Anterior circulation: Atherosclerotic plaque on the carotid siphons without stenosis or ulceration. Aplastic left A1 segment. No branch occlusion, beading, or aneurysm. Posterior circulation: Less intense enhancement of the left vertebral artery attributed to stenotic disease in the neck/chest. Left PCA branch occlusion correlating with the acute infarct. Venous sinuses: Patent Anatomic variants: As above Review of the MIP images confirms the above findings IMPRESSION: 1. Left subclavian origin high-grade stenosis with delayed enhancement of the distal left vertebral artery. Additionally, there is irregular plaque/mural thrombus involving the aortic isthmus and left subclavian origin. 2. Left PCA branch occlusion correlating with the known acute infarct. 3. Cervical carotid atherosclerosis with up to 50% stenosis at the left ICA bulb. 4. 3 cm right thyroid mass, recommend elective ultrasound. Electronically Signed   By:  Monte Fantasia M.D.   On: 08/23/2018 10:28   Ct Angio Neck W Or Wo Contrast  Result Date: 08/23/2018 CLINICAL DATA:  Follow-up occipital stroke EXAM: CT ANGIOGRAPHY HEAD AND NECK TECHNIQUE: Multidetector CT imaging of the head and neck was performed using the standard protocol during bolus administration of intravenous contrast. Multiplanar CT image reconstructions and MIPs were obtained to evaluate the vascular anatomy. Carotid stenosis measurements (when applicable) are obtained utilizing NASCET criteria, using the distal internal carotid diameter as the denominator. CONTRAST:  24mL OMNIPAQUE IOHEXOL 350 MG/ML SOLN COMPARISON:  Brain MRI from yesterday FINDINGS: CT HEAD FINDINGS Brain: The patient's occipital cortex infarct is not readily visualized by CT. Mild small vessel ischemic change and cerebral volume loss for age. No acute hemorrhage, hydrocephalus, or masslike finding Vascular: See below Skull: Negative Sinuses: Negative Orbits: Negative Review of the MIP images confirms the above findings CTA NECK FINDINGS Aortic arch: Atherosclerotic plaque. Partially visualized CABG grafts. There is irregular thrombus/plaque at the aortic isthmus along the superior to posterior walls, also involving the stenotic proximal left subclavian artery. Right carotid system: Moderate calcified plaque at the bifurcation and ICA bulb with 35% stenosis. No ulceration or beading. Left carotid system: Partial retropharyngeal course of the ICA. Calcified plaque at the ICA bulb with up to 50% stenosis as measured on  coronal reformats. No ulceration. Vertebral arteries: Irregular plaque/thrombus at the aortic isthmus also involves the left subclavian origin. Left subclavian origin stenosis quantification is limited by streak artifact but estimated at 75% stenosis as measured on coronal reformats. Mild atheromatous narrowing at the left vertebral origin. The vertebral arteries themselves are smooth and patent. There is less  intense enhancement within the left vertebral artery, best seen at the V4 segment. Skeleton: Cervical spine degeneration. Other neck: 2.9 cm right thyroid mass which is appropriate for sonographic follow-up. Upper chest: Negative Review of the MIP images confirms the above findings CTA HEAD FINDINGS Anterior circulation: Atherosclerotic plaque on the carotid siphons without stenosis or ulceration. Aplastic left A1 segment. No branch occlusion, beading, or aneurysm. Posterior circulation: Less intense enhancement of the left vertebral artery attributed to stenotic disease in the neck/chest. Left PCA branch occlusion correlating with the acute infarct. Venous sinuses: Patent Anatomic variants: As above Review of the MIP images confirms the above findings IMPRESSION: 1. Left subclavian origin high-grade stenosis with delayed enhancement of the distal left vertebral artery. Additionally, there is irregular plaque/mural thrombus involving the aortic isthmus and left subclavian origin. 2. Left PCA branch occlusion correlating with the known acute infarct. 3. Cervical carotid atherosclerosis with up to 50% stenosis at the left ICA bulb. 4. 3 cm right thyroid mass, recommend elective ultrasound. Electronically Signed   By: Monte Fantasia M.D.   On: 08/23/2018 10:28   Mr Brain Wo Contrast  Result Date: 08/22/2018 CLINICAL DATA:  New onset right visual field deficit beginning 2 days ago. EXAM: MRI HEAD WITHOUT CONTRAST TECHNIQUE: Multiplanar, multiecho pulse sequences of the brain and surrounding structures were obtained without intravenous contrast. COMPARISON:  MRI brain 04/17/2013 FINDINGS: Brain: Diffusion-weighted images demonstrate a linear 2 cm acute nonhemorrhagic infarct involving the medial left occipital lobe, likely impacting the visual cortex. No other acute infarct is present. T2 signal changes are associated with the areas of restricted diffusion. Moderate generalized atrophy is present. Moderate  periventricular and subcortical T2 changes are noted bilaterally. A remote lacunar infarct is present in the right caudate head. Remote ischemic changes are noted in the thalamus and extend into the brainstem bilaterally. Cerebellum is unremarkable. The ventricles are of normal size. No significant extraaxial fluid collection is present. There is no significant change in the atrophy or matter disease. Vascular: Flow is present in the major intracranial arteries. Skull and upper cervical spine: The craniocervical junction is normal. Upper cervical spine is within normal limits. Marrow signal is unremarkable. Sinuses/Orbits: The paranasal sinuses and mastoid air cells are clear. Bilateral lens replacements are noted. Globes and orbits are otherwise unremarkable. IMPRESSION: 1. Acute/subacute nonhemorrhagic infarct involving the medial left occipital lobe corresponds with the patient's recent visual changes. 2. Moderate atrophy and white matter disease is otherwise stable. Electronically Signed   By: San Morelle M.D.   On: 08/22/2018 18:32   Vas [place] Lower Extremity Venous (dvt)  Result Date: 08/23/2018  Lower Venous Study Indications: Stroke.  Comparison Study: No prior Performing Technologist: Abram Sander RVS  Examination Guidelines: A complete evaluation includes B-mode imaging, spectral Doppler, color Doppler, and power Doppler as needed of all accessible portions of each vessel. Bilateral testing is considered an integral part of a complete examination. Limited examinations for reoccurring indications may be performed as noted.  +---------+---------------+---------+-----------+----------+-------+ RIGHT    CompressibilityPhasicitySpontaneityPropertiesSummary +---------+---------------+---------+-----------+----------+-------+ CFV      Full           Yes      Yes                          +---------+---------------+---------+-----------+----------+-------+  SFJ      Full                                                  +---------+---------------+---------+-----------+----------+-------+ FV Prox  Full                                                 +---------+---------------+---------+-----------+----------+-------+ FV Mid   Full                                                 +---------+---------------+---------+-----------+----------+-------+ FV DistalFull                                                 +---------+---------------+---------+-----------+----------+-------+ PFV      Full                                                 +---------+---------------+---------+-----------+----------+-------+ POP      Full           Yes      Yes                          +---------+---------------+---------+-----------+----------+-------+ PTV      Full                                                 +---------+---------------+---------+-----------+----------+-------+ PERO     Full                                                 +---------+---------------+---------+-----------+----------+-------+   +---------+---------------+---------+-----------+----------+-------+ LEFT     CompressibilityPhasicitySpontaneityPropertiesSummary +---------+---------------+---------+-----------+----------+-------+ CFV      Full           Yes      Yes                          +---------+---------------+---------+-----------+----------+-------+ SFJ      Full                                                 +---------+---------------+---------+-----------+----------+-------+ FV Prox  Full                                                 +---------+---------------+---------+-----------+----------+-------+  FV Mid   Full                                                 +---------+---------------+---------+-----------+----------+-------+ FV DistalFull                                                  +---------+---------------+---------+-----------+----------+-------+ PFV      Full                                                 +---------+---------------+---------+-----------+----------+-------+ POP      Full           Yes      Yes                          +---------+---------------+---------+-----------+----------+-------+ PTV      Full                                                 +---------+---------------+---------+-----------+----------+-------+ PERO     Full                                                 +---------+---------------+---------+-----------+----------+-------+     Summary: Right: There is no evidence of deep vein thrombosis in the lower extremity. No cystic structure found in the popliteal fossa. Left: There is no evidence of deep vein thrombosis in the lower extremity. No cystic structure found in the popliteal fossa.  *See table(s) above for measurements and observations. Electronically signed by Monica Martinez MD on 08/23/2018 at 3:40:05 PM.    Final       Subjective:  Patient seen and examined the bedside this morning.  Hemodynamically stable for discharge today after loop recorder placement.  Discharge Exam: Vitals:   08/24/18 0352 08/24/18 0810  BP: (!) 165/58 (!) 149/95  Pulse: 63 60  Resp: 16 16  Temp: 98.4 F (36.9 C) 99.3 F (37.4 C)  SpO2: 92% 95%   Vitals:   08/24/18 0002 08/24/18 0011 08/24/18 0352 08/24/18 0810  BP: (!) 225/79 (!) 175/62 (!) 165/58 (!) 149/95  Pulse: 86 79 63 60  Resp: 18  16 16   Temp: 98.3 F (36.8 C)  98.4 F (36.9 C) 99.3 F (37.4 C)  TempSrc: Oral  Oral Oral  SpO2: 92%  92% 95%  Weight:      Height:        General: Pt is alert, awake, not in acute distress Cardiovascular: RRR, S1/S2 +, no rubs, no gallops Respiratory: CTA bilaterally, no wheezing, no rhonchi Abdominal: Soft, NT, ND, bowel sounds + Extremities: no edema, no cyanosis    The results of significant diagnostics from this  hospitalization (including imaging, microbiology, ancillary and laboratory) are listed below for reference.     Microbiology: Recent Results (  from the past 240 hour(s))  SARS CORONAVIRUS 2 Nasal Swab Aptima Multi Swab     Status: None   Collection Time: 08/22/18  6:42 PM   Specimen: Aptima Multi Swab; Nasal Swab  Result Value Ref Range Status   SARS Coronavirus 2 NEGATIVE NEGATIVE Final    Comment: (NOTE) SARS-CoV-2 target nucleic acids are NOT DETECTED. The SARS-CoV-2 RNA is generally detectable in upper and lower respiratory specimens during the acute phase of infection. Negative results do not preclude SARS-CoV-2 infection, do not rule out co-infections with other pathogens, and should not be used as the sole basis for treatment or other patient management decisions. Negative results must be combined with clinical observations, patient history, and epidemiological information. The expected result is Negative. Fact Sheet for Patients: SugarRoll.be Fact Sheet for Healthcare Providers: https://www.woods-mathews.com/ This test is not yet approved or cleared by the [place] FDA and  has been authorized for detection and/or diagnosis of SARS-CoV-2 by FDA under an Emergency Use Authorization (EUA). This EUA will remain  in effect (meaning this test can be used) for the duration of the COVID-19 declaration under Section 56 4(b)(1) of the Act, 21 U.S.C. section 360bbb-3(b)(1), unless the authorization is terminated or revoked sooner. Performed at [facility], [place] [address] [place], [place] [address]      Labs: BNP (last 3 results) No results for input(s): BNP in the last 8760 hours. Basic Metabolic Panel: Recent Labs  Lab 08/22/18 1351 08/22/18 2243 08/23/18 0434  NA 137  --  142  K 3.5  --  3.7  CL 100  --  105  CO2 27  --  28  GLUCOSE 320*  --  150*  BUN 21  --  15  CREATININE 1.19* 1.04* 0.90  CALCIUM 8.4*  --   8.5*   Liver Function Tests: Recent Labs  Lab 08/22/18 1351 08/23/18 0434  AST 17 15  ALT 13 12  ALKPHOS 111 100  BILITOT 0.5 0.4  PROT 6.6 6.0*  ALBUMIN 3.2* 2.9*   No results for input(s): LIPASE, AMYLASE in the last 168 hours. No results for input(s): AMMONIA in the last 168 hours. CBC: Recent Labs  Lab 08/22/18 1351 08/22/18 2243 08/23/18 0434  WBC 9.0 9.6 8.6  NEUTROABS 5.6  --   --   HGB 11.3* 11.7* 11.1*  HCT 36.9 36.7 34.7*  MCV 84.2 82.8 82.4  PLT 242 258 240   Cardiac Enzymes: No results for input(s): CKTOTAL, CKMB, CKMBINDEX, TROPONINI in the last 168 hours. BNP: Invalid input(s): POCBNP CBG: Recent Labs  Lab 08/23/18 0624 08/23/18 1129 08/23/18 1612 08/23/18 2121 08/24/18 0616  GLUCAP 118* 222* 228* 195* 147*   D-Dimer No results for input(s): DDIMER in the last 72 hours. Hgb A1c Recent Labs    08/22/18 2243 08/23/18 0434  HGBA1C 8.7* 8.7*   Lipid Profile Recent Labs    08/23/18 0434  CHOL 127  HDL 27*  LDLCALC 28  TRIG 358*  CHOLHDL 4.7   Thyroid function studies No results for input(s): TSH, T4TOTAL, T3FREE, THYROIDAB in the last 72 hours.  Invalid input(s): FREET3 Anemia work up No results for input(s): VITAMINB12, FOLATE, FERRITIN, TIBC, IRON, RETICCTPCT in the last 72 hours. Urinalysis    Component Value Date/Time   COLORURINE YELLOW 03/05/2017 0250   APPEARANCEUR CLEAR 03/05/2017 0250   LABSPEC 1.018 03/05/2017 0250   PHURINE 6.0 03/05/2017 0250   GLUCOSEU 150 (A) 03/05/2017 0250   HGBUR SMALL (A) 03/05/2017 0250  BILIRUBINUR NEGATIVE 03/05/2017 0250   KETONESUR NEGATIVE 03/05/2017 0250   PROTEINUR 100 (A) 03/05/2017 0250   UROBILINOGEN 1.0 07/16/2009 1611   NITRITE NEGATIVE 03/05/2017 0250   LEUKOCYTESUR SMALL (A) 03/05/2017 0250   Sepsis Labs Invalid input(s): PROCALCITONIN,  WBC,  LACTICIDVEN Microbiology Recent Results (from the past 240 hour(s))  SARS CORONAVIRUS 2 Nasal Swab Aptima Multi Swab     Status:  None   Collection Time: 08/22/18  6:42 PM   Specimen: Aptima Multi Swab; Nasal Swab  Result Value Ref Range Status   SARS Coronavirus 2 NEGATIVE NEGATIVE Final    Comment: (NOTE) SARS-CoV-2 target nucleic acids are NOT DETECTED. The SARS-CoV-2 RNA is generally detectable in upper and lower respiratory specimens during the acute phase of infection. Negative results do not preclude SARS-CoV-2 infection, do not rule out co-infections with other pathogens, and should not be used as the sole basis for treatment or other patient management decisions. Negative results must be combined with clinical observations, patient history, and epidemiological information. The expected result is Negative. Fact Sheet for Patients: SugarRoll.be Fact Sheet for Healthcare Providers: https://www.woods-mathews.com/ This test is not yet approved or cleared by the [place] FDA and  has been authorized for detection and/or diagnosis of SARS-CoV-2 by FDA under an Emergency Use Authorization (EUA). This EUA will remain  in effect (meaning this test can be used) for the duration of the COVID-19 declaration under Section 56 4(b)(1) of the Act, 21 U.S.C. section 360bbb-3(b)(1), unless the authorization is terminated or revoked sooner. Performed at [facility], [place] [address], [place], [place] [address]     Please note: You were cared for by a hospitalist during your hospital stay. Once you are discharged, your primary care physician will handle any further medical issues. Please note that NO REFILLS for any discharge medications will be authorized once you are discharged, as it is imperative that you return to your primary care physician (or establish a relationship with a primary care physician if you do not have one) for your post hospital discharge needs so that they can reassess your need for medications and monitor your lab values.    Time coordinating  discharge: 40 minutes  SIGNED:   Shelly Coss, MD  [facility] 08/24/2018, 10:37 AM Pager [phone]  If 7PM-7AM, please contact night-coverage [email or web address] Password [email or web address]

## 2018-08-24 NOTE — Progress Notes (Signed)
Pt off unit to cath lab for loop recorder. Delia Heady RN

## 2018-08-24 NOTE — TOC Transition Note (Signed)
Transition of Care [facility]) - CM/SW Discharge Note   Patient Details  Name: Linda Oliver MRN: [id number] Date of Birth: 04-Dec-1932  Transition of Care [facility]) CM/SW Contact:  Pollie Friar, RN Phone Number: 08/24/2018, 2:10 PM   Clinical Narrative:    Pt discharging home with self care. Pt has intermittent supervision at home per her daughter.  Daughter to provide transport home.   Final next level of care: Home/Self Care Barriers to Discharge: No Barriers Identified   Patient Goals and CMS Choice        Discharge Placement                       Discharge Plan and Services                                     Social Determinants of Health (SDOH) Interventions     Readmission Risk Interventions No flowsheet data found.

## 2018-08-24 NOTE — Consult Note (Addendum)
ELECTROPHYSIOLOGY CONSULT NOTE  Patient ID: Linda Oliver MRN: [id number], DOB/AGE: [birth year]   Admit date: 08/22/2018 Date of Consult: 08/24/2018  Primary Physician: Shon Baton, MD Primary Cardiologist: [facility], Floyce Stakes, NP Reason for Consultation: Cryptogenic stroke ; recommendations regarding Implantable Loop Recorder, requested by Dr. Erlinda Hong  History of Present Illness Linda Oliver was admitted on 08/22/2018 with R eye visual loss, stroke.   PMHx includes, CAD, HTN, HLD, DM, PVD, OSA (no CPAP) and h/o orthostatic symptoms, seizure, migraine HA Neurology notes: Medial L occipital infarct embolic secondary to unknown source. Large vessel disease vs. cardioembolic source.  she has undergone workup for stroke including echocardiogram and carotid angio.  The patient has been monitored on telemetry which has demonstrated sinus rhythm with no arrhythmias.  Neurology has elected to defer TEE.   Echocardiogram this admission demonstrated   IMPRESSIONS  1. The left ventricle has normal systolic function with an ejection fraction of 60-65%. The cavity size was normal. Left ventricular diastolic Doppler parameters are consistent with impaired relaxation. No evidence of left ventricular regional wall  motion abnormalities.  2. The right ventricle has normal systolic function. The cavity was normal. There is no increase in right ventricular wall thickness.  3. Left atrial size was mildly dilated.  4. There is mild mitral annular calcification present. No evidence of mitral valve stenosis. Trivial mitral regurgitation.  5. The aortic valve is tricuspid. Mild calcification of the aortic valve. Aortic valve regurgitation is mild to moderate by color flow Doppler. No stenosis of the aortic valve.  6. The aorta is normal unless otherwise noted.  7. Normal IVC size. PA systolic pressure 39 mmHg.  FINDINGS  Left Ventricle: The left ventricle has normal systolic function, with an ejection  fraction of 60-65%. The cavity size was normal. There is no increase in left ventricular wall thickness. Left ventricular diastolic Doppler parameters are consistent with  impaired relaxation. No evidence of left ventricular regional wall motion abnormalities..  Right Ventricle: The right ventricle has normal systolic function. The cavity was normal. There is no increase in right ventricular wall thickness.  Left Atrium: Left atrial size was mildly dilated.  Right Atrium: Right atrial size was normal in size.  Interatrial Septum: No atrial level shunt detected by color flow Doppler.  Pericardium: There is no evidence of pericardial effusion.  Mitral Valve: The mitral valve is normal in structure. There is mild mitral annular calcification present. Mitral valve regurgitation is trivial by color flow Doppler. No evidence of mitral valve stenosis.  Tricuspid Valve: The tricuspid valve is normal in structure. Tricuspid valve regurgitation is trivial by color flow Doppler.  Aortic Valve: The aortic valve is tricuspid Mild calcification of the aortic valve. Aortic valve regurgitation is mild to moderate by color flow Doppler. There is No stenosis of the aortic valve.  Pulmonic Valve: The pulmonic valve was normal in structure. Pulmonic valve regurgitation is not visualized by color flow Doppler.  Aorta: The aorta is normal unless otherwise noted.  Venous: The inferior vena cava is normal in size with greater than 50% respiratory variability.     Lab work is reviewed.  Prior to admission, the patient denies chest pain, shortness of breath, dizziness, palpitations, or syncope.  She is recovering from their stroke with plans to home at discharge.     Past Medical History:  Diagnosis Date   Anginal pain ([place])    occ   Anxiety    Broken ankle    Bullous  pemphigus    CAD (coronary artery disease)    [place]   80% stenosis ostial Left main, 30% stenosis  proximal LAD, 80% stenosis proximal RCA CABG  -  LIMA to LAD, SVG to int, SVG to [place], [place]    Depression    Diabetic peripheral neuropathy (HCC)    GERD (gastroesophageal reflux disease)    Headache(784.0)    hx migraines   Heart murmur    mvp   Hyperlipidemia    Hypertension    Hypertensive heart disease    Nasal bleeding    occ due to O2   OA (osteoarthritis)    Obesity (BMI 30-39.9)    Obstructive sleep apnea on CPAP    Osteopenia    Peripheral neuropathy    Seizure ([place])    2009   Sleep apnea    dx 4yrs ago doesnt use cpap but uses O2 at nite 2L   Stones in the urinary tract    Type 2 diabetes mellitus with vascular disease ([place])    UTI (lower urinary tract infection)      Surgical History:  Past Surgical History:  Procedure Laterality Date   ABDOMINAL HYSTERECTOMY  70's   CARDIAC CATHETERIZATION     CARPAL TUNNEL RELEASE Bilateral    CHOLECYSTECTOMY     CORONARY ARTERY BYPASS GRAFT  96   SHOULDER ARTHROSCOPY     rt   SHOULDER ARTHROSCOPY WITH ROTATOR CUFF REPAIR AND SUBACROMIAL DECOMPRESSION  12/01/2011   Procedure: SHOULDER ARTHROSCOPY WITH ROTATOR CUFF REPAIR AND SUBACROMIAL DECOMPRESSION;  Surgeon: Nita Sells, MD;  Location: [facility];  Service: Orthopedics;  Laterality: Left;  shoulder debridement calcific tendonitis     Medications Prior to Admission  Medication Sig Dispense Refill Last Dose   acetaminophen (TYLENOL) 500 MG tablet Take 500 mg by mouth every 4 (four) hours as needed for moderate pain.   unk at unk   amoxicillin (AMOXIL) 500 MG capsule Take 2,000 mg by mouth See admin instructions. Take 2,000 mg by mouth one hour prior to dental appointments or procedures- as directed   DENTAL at DENTAL   aspirin 325 MG tablet Take 325 mg by mouth at bedtime.    08/21/2018 at 2330   busPIRone (BUSPAR) 7.5 MG tablet Take 7.5 mg by mouth 2 (two) times daily.   08/22/2018 at am   cyanocobalamin 1000 MCG tablet Take  1,000 mcg by mouth daily.   08/22/2018 at am   DULoxetine (CYMBALTA) 60 MG capsule Take 60 mg by mouth daily.   08/22/2018 at am   fesoterodine (TOVIAZ) 8 MG TB24 tablet Take 8 mg by mouth every evening.    08/21/2018 at pm   fluticasone (FLONASE) 50 MCG/ACT nasal spray Place 1 spray into both nostrils 2 (two) times daily as needed for allergies or rhinitis.   unk at unk   gabapentin (NEURONTIN) 300 MG capsule Take 300 mg by mouth 2 (two) times daily.   08/22/2018 at am   glimepiride (AMARYL) 1 MG tablet Take 1 mg by mouth daily.   08/22/2018 at am   hydrOXYzine (ATARAX/VISTARIL) 25 MG tablet Take 50 mg by mouth every evening.    08/21/2018 at pm   Insulin Glargine (LANTUS SOLOSTAR) 100 UNIT/ML Solostar Pen Inject 37 Units into the skin daily before breakfast.   08/22/2018 at am   lactobacillus acidophilus (BACID) TABS tablet Take 1 tablet by mouth daily.   08/22/2018 at am   metoprolol succinate (TOPROL-XL) 25  MG 24 hr tablet Take 12.5 mg by mouth 2 (two) times daily.   08/22/2018 at 0800   NAMZARIC 28-10 MG CP24 TAKE 1 CAPSULE DAILY (Patient taking differently: Take 1 capsule by mouth every evening. ) 90 capsule 3 08/21/2018 at pm   pantoprazole (PROTONIX) 40 MG tablet Take 40 mg by mouth every evening.    08/21/2018 at pm   Phenylephrine HCl (SINEX REGULAR NA) Place 1 spray into both nostrils as needed (for congestion).   unk at unk   rosuvastatin (CRESTOR) 10 MG tablet Take 10 mg by mouth daily.   08/22/2018 at am   sitaGLIPtin (JANUVIA) 100 MG tablet Take 100 mg by mouth daily.   08/22/2018 at am   valsartan-hydrochlorothiazide (DIOVAN-HCT) 160-25 MG tablet Take 0.5 tablets by mouth daily. 90 tablet 1 08/22/2018 at am    Inpatient Medications:   aspirin EC  325 mg Oral Daily   busPIRone  7.5 mg Oral BID   clopidogrel  75 mg Oral Daily   donepezil  10 mg Oral Daily   DULoxetine  60 mg Oral Daily   enoxaparin (LOVENOX) injection  40 mg Subcutaneous Q24H   ezetimibe  10 mg Oral  Daily   gabapentin  300 mg Oral BID   hydrOXYzine  50 mg Oral QPM   insulin aspart  0-15 Units Subcutaneous TID WC   insulin aspart  0-5 Units Subcutaneous QHS   lactobacillus acidophilus  1 tablet Oral Daily   memantine  28 mg Oral Daily   metoprolol succinate  12.5 mg Oral BID   sodium chloride flush  3 mL Intravenous Once   cyanocobalamin  1,000 mcg Oral Daily    Allergies:  Allergies  Allergen Reactions   Percocet [Oxycodone-Acetaminophen] Nausea And Vomiting   Latex Itching and Rash    EKG leads    Social History   Socioeconomic History   Marital status: Widowed    Spouse name: Not on file   Number of children: 1   Years of education: 12   Highest education level: Not on file  Occupational History   Not on file  Social Needs   Financial resource strain: Not on file   Food insecurity    Worry: Not on file    Inability: Not on file   Transportation needs    Medical: Not on file    Non-medical: Not on file  Tobacco Use   Smoking status: Former Smoker    Packs/day: 1.00    Years: 30.00    Pack years: 30.00    Types: Cigarettes    Quit date: 11/25/1986    Years since quitting: 31.7   Smokeless tobacco: Never Used  Substance and Sexual Activity   Alcohol use: No   Drug use: No   Sexual activity: Yes    Birth control/protection: Post-menopausal  Lifestyle   Physical activity    Days per week: Not on file    Minutes per session: Not on file   Stress: Not on file  Relationships   Social connections    Talks on phone: Not on file    Gets together: Not on file    Attends religious service: Not on file    Active member of club or organization: Not on file    Attends meetings of clubs or organizations: Not on file    Relationship status: Not on file   Intimate partner violence    Fear of current or ex partner: Not on file    Emotionally  abused: Not on file    Physically abused: Not on file    Forced sexual activity: Not on file    Other Topics Concern   Not on file  Social History Narrative   Married, has 1 child   Caffeine use: none     Family History  Problem Relation Age of Onset   Coronary artery disease Mother    Heart disease Father    Cancer Father        skin   Cancer Brother    Cancer - Colon Brother        brain, Lymphoma      Review of Systems: All other systems reviewed and are otherwise negative except as noted above.  Physical Exam: Vitals:   08/24/18 0002 08/24/18 0011 08/24/18 0352 08/24/18 0810  BP: (!) 225/79 (!) 175/62 (!) 165/58 (!) 149/95  Pulse: 86 79 63 60  Resp: 18  16 16   Temp: 98.3 F (36.8 C)  98.4 F (36.9 C) 99.3 F (37.4 C)  TempSrc: Oral  Oral Oral  SpO2: 92%  92% 95%  Weight:      Height:        GEN- The patient is well appearing, alert and oriented x 3 today.   Head- normocephalic, atraumatic Eyes-  Sclera clear, conjunctiva pink Ears- hearing intact Oropharynx- clear Neck- supple Lungs- CTA b/l, normal work of breathing Heart- RRR, no murmurs, rubs or gallops  GI- soft, NT, ND Extremities- no clubbing, cyanosis, or edema MS- no significant deformity or atrophy Skin- no rash or lesion Psych- euthymic mood, full affect   Labs:   Lab Results  Component Value Date   WBC 8.6 08/23/2018   HGB 11.1 (L) 08/23/2018   HCT 34.7 (L) 08/23/2018   MCV 82.4 08/23/2018   PLT 240 08/23/2018    Recent Labs  Lab 08/23/18 0434  NA 142  K 3.7  CL 105  CO2 28  BUN 15  CREATININE 0.90  CALCIUM 8.5*  PROT 6.0*  BILITOT 0.4  ALKPHOS 100  ALT 12  AST 15  GLUCOSE 150*   Lab Results  Component Value Date   CKTOTAL 32 09/21/2016   CKMB 0.6 12/25/2007   TROPONINI <0.01        NO INDICATION OF MYOCARDIAL INJURY. 12/25/2007   Lab Results  Component Value Date   CHOL 127 08/23/2018   CHOL  12/26/2007    118        ATP III CLASSIFICATION:  <200     mg/dL   Desirable  200-239  mg/dL   Borderline High  >=240    mg/dL   High   Lab Results   Component Value Date   HDL 27 (L) 08/23/2018   HDL 52 12/26/2007   Lab Results  Component Value Date   LDLCALC 28 08/23/2018   [place]  12/26/2007    53        Total Cholesterol/HDL:CHD Risk Coronary Heart Disease Risk Table                     Men   Women  1/2 Average Risk   3.4   3.3   Lab Results  Component Value Date   TRIG 358 (H) 08/23/2018   TRIG 63 12/26/2007   Lab Results  Component Value Date   CHOLHDL 4.7 08/23/2018   CHOLHDL 2.3 12/26/2007   No results found for: LDLDIRECT  No results found for: DDIMER   Radiology/Studies:   Ct  Angio Head W Or Wo Contrast Result Date: 08/23/2018 CLINICAL DATA:  Follow-up occipital stroke EXAM: CT ANGIOGRAPHY HEAD AND NECK TECHNIQUE: Multidetector CT imaging of the head and neck was performed using the standard protocol during bolus administration of intravenous contrast. Multiplanar CT image reconstructions and MIPs were obtained to evaluate the vascular anatomy. Carotid stenosis measurements (when applicable) are obtained utilizing NASCET criteria, using the distal internal carotid diameter as the denominator. CONTRAST:  74mL OMNIPAQUE IOHEXOL 350 MG/ML SOLN COMPARISON:  Brain MRI from yesterday FINDINGS: CT HEAD FINDINGS Brain: The patient's occipital cortex infarct is not readily visualized by CT. Mild small vessel ischemic change and cerebral volume loss for age. No acute hemorrhage, hydrocephalus, or masslike finding Vascular: See below Skull: Negative Sinuses: Negative Orbits: Negative Review of the MIP images confirms the above findings CTA NECK FINDINGS Aortic arch: Atherosclerotic plaque. Partially visualized CABG grafts. There is irregular thrombus/plaque at the aortic isthmus along the superior to posterior walls, also involving the stenotic proximal left subclavian artery. Right carotid system: Moderate calcified plaque at the bifurcation and ICA bulb with 35% stenosis. No ulceration or beading. Left carotid system: Partial  retropharyngeal course of the ICA. Calcified plaque at the ICA bulb with up to 50% stenosis as measured on coronal reformats. No ulceration. Vertebral arteries: Irregular plaque/thrombus at the aortic isthmus also involves the left subclavian origin. Left subclavian origin stenosis quantification is limited by streak artifact but estimated at 75% stenosis as measured on coronal reformats. Mild atheromatous narrowing at the left vertebral origin. The vertebral arteries themselves are smooth and patent. There is less intense enhancement within the left vertebral artery, best seen at the V4 segment. Skeleton: Cervical spine degeneration. Other neck: 2.9 cm right thyroid mass which is appropriate for sonographic follow-up. Upper chest: Negative Review of the MIP images confirms the above findings CTA HEAD FINDINGS Anterior circulation: Atherosclerotic plaque on the carotid siphons without stenosis or ulceration. Aplastic left A1 segment. No branch occlusion, beading, or aneurysm. Posterior circulation: Less intense enhancement of the left vertebral artery attributed to stenotic disease in the neck/chest. Left PCA branch occlusion correlating with the acute infarct. Venous sinuses: Patent Anatomic variants: As above Review of the MIP images confirms the above findings IMPRESSION: 1. Left subclavian origin high-grade stenosis with delayed enhancement of the distal left vertebral artery. Additionally, there is irregular plaque/mural thrombus involving the aortic isthmus and left subclavian origin. 2. Left PCA branch occlusion correlating with the known acute infarct. 3. Cervical carotid atherosclerosis with up to 50% stenosis at the left ICA bulb. 4. 3 cm right thyroid mass, recommend elective ultrasound. Electronically Signed   By: Monte Fantasia M.D.   On: 08/23/2018 10:28    Mr Brain Wo Contrast Result Date: 08/22/2018 CLINICAL DATA:  New onset right visual field deficit beginning 2 days ago. EXAM: MRI HEAD WITHOUT  CONTRAST TECHNIQUE: Multiplanar, multiecho pulse sequences of the brain and surrounding structures were obtained without intravenous contrast. COMPARISON:  MRI brain 04/17/2013 FINDINGS: Brain: Diffusion-weighted images demonstrate a linear 2 cm acute nonhemorrhagic infarct involving the medial left occipital lobe, likely impacting the visual cortex. No other acute infarct is present. T2 signal changes are associated with the areas of restricted diffusion. Moderate generalized atrophy is present. Moderate periventricular and subcortical T2 changes are noted bilaterally. A remote lacunar infarct is present in the right caudate head. Remote ischemic changes are noted in the thalamus and extend into the brainstem bilaterally. Cerebellum is unremarkable. The ventricles are of normal size. No significant  extraaxial fluid collection is present. There is no significant change in the atrophy or matter disease. Vascular: Flow is present in the major intracranial arteries. Skull and upper cervical spine: The craniocervical junction is normal. Upper cervical spine is within normal limits. Marrow signal is unremarkable. Sinuses/Orbits: The paranasal sinuses and mastoid air cells are clear. Bilateral lens replacements are noted. Globes and orbits are otherwise unremarkable. IMPRESSION: 1. Acute/subacute nonhemorrhagic infarct involving the medial left occipital lobe corresponds with the patient's recent visual changes. 2. Moderate atrophy and white matter disease is otherwise stable. Electronically Signed   By: San Morelle M.D.   On: 08/22/2018 18:32     Vas [place] Lower Extremity Venous (dvt) Result Date: 08/23/2018  Lower Venous Study Indications: Stroke.  Comparison Study: No prior Performing Technologist: Abram Sander RVS  Examination Guidelines: A complete evaluation includes B-mode imaging, spectral Doppler, color Doppler, and power Doppler as needed of all accessible portions of each vessel. Bilateral testing is  considered an integral part of a complete examination. Limited examinations for reoccurring indications may be performed as noted.  Summary: Right: There is no evidence of deep vein thrombosis in the lower extremity. No cystic structure found in the popliteal fossa. Left: There is no evidence of deep vein thrombosis in the lower extremity. No cystic structure found in the popliteal fossa.  *See table(s) above for measurements and observations. Electronically signed by Monica Martinez MD on 08/23/2018 at 3:40:05 PM.    Final     12-lead ECG SR, no EKG for current admission in Epic All prior EKG's in EPIC reviewed with no documented atrial fibrillation  Telemetry SR  Assessment and Plan:  1. Cryptogenic stroke The patient presents with cryptogenic stroke.  Dr. Curt Bears has seen and examined the patient.  He spoke at length with the patient about monitoring for afib with either a 30 day event monitor or an implantable loop recorder.  Risks, benefits, and alteratives to implantable loop recorder were discussed with the patient today.   At this time, the patient is very clear in her decision to proceed with implantable loop recorder.   Wound care was reviewed with the patient (keep incision clean and dry for 3 days).  Wound check Linda Oliver be scheduled for the patient  Please call with questions.   Baldwin Jamaica, PA-C 08/24/2018  I have seen and examined this patient with Tommye Standard.  Agree with above, note added to reflect my findings.  On exam, RRR, no murmurs, lungs clear.  Patient presented to the hospital with cryptogenic stroke. To date, no cause has been found. TEE planned for today. If unrevealing, [place] Slimp plan for LINQ monitor to look for atrial fibrillation. Risks and benefits discussed. Risks include but not limited to bleeding and infection. The patient understands the risks and has agreed to the procedure.  Hala Narula M. Khadejah Son MD 08/24/2018 10:26 AM

## 2018-08-24 NOTE — Care Management Important Message (Signed)
Important Message  Patient Details  Name: Linda Oliver MRN: [id number] Date of Birth: 1932-05-01   Medicare Important Message Given:  Yes     Orbie Pyo 08/24/2018, 2:52 PM

## 2018-08-24 NOTE — Progress Notes (Signed)
STROKE TEAM PROGRESS NOTE   INTERVAL HISTORY Pt lying in bed. Had loop placed today. And eager to go home.    Vitals:   08/24/18 0011 08/24/18 0352 08/24/18 0810 08/24/18 1147  BP: (!) 175/62 (!) 165/58 (!) 149/95 (!) 150/45  Pulse: 79 63 60 70  Resp:  16 16 (!) 22  Temp:  98.4 F (36.9 C) 99.3 F (37.4 C) 99 F (37.2 C)  TempSrc:  Oral Oral Oral  SpO2:  92% 95% (!) 88%  Weight:      Height:        CBC:  Recent Labs  Lab 08/22/18 1351 08/22/18 2243 08/23/18 0434  WBC 9.0 9.6 8.6  NEUTROABS 5.6  --   --   HGB 11.3* 11.7* 11.1*  HCT 36.9 36.7 34.7*  MCV 84.2 82.8 82.4  PLT 242 258 093    Basic Metabolic Panel:  Recent Labs  Lab 08/22/18 1351 08/22/18 2243 08/23/18 0434  NA 137  --  142  K 3.5  --  3.7  CL 100  --  105  CO2 27  --  28  GLUCOSE 320*  --  150*  BUN 21  --  15  CREATININE 1.19* 1.04* 0.90  CALCIUM 8.4*  --  8.5*   Lipid Panel:     Component Value Date/Time   CHOL 127 08/23/2018 0434   TRIG 358 (H) 08/23/2018 0434   HDL 27 (L) 08/23/2018 0434   CHOLHDL 4.7 08/23/2018 0434   VLDL 72 (H) 08/23/2018 0434   LDLCALC 28 08/23/2018 0434   HgbA1c:  Lab Results  Component Value Date   HGBA1C 8.7 (H) 08/23/2018   Urine Drug Screen:     Component Value Date/Time   LABOPIA NONE DETECTED 12/26/2007 0030   COCAINSCRNUR NONE DETECTED 12/26/2007 0030   LABBENZ NONE DETECTED 12/26/2007 0030   AMPHETMU NONE DETECTED 12/26/2007 0030   THCU NONE DETECTED 12/26/2007 0030   LABBARB  12/26/2007 0030    NONE DETECTED        DRUG SCREEN FOR MEDICAL PURPOSES ONLY.  IF CONFIRMATION IS NEEDED FOR ANY PURPOSE, NOTIFY LAB WITHIN 5 DAYS.    Alcohol Level     Component Value Date/Time   [facility]  12/25/2007 1930    <5        LOWEST DETECTABLE LIMIT FOR SERUM ALCOHOL IS 5 mg/dL FOR MEDICAL PURPOSES ONLY    IMAGING Ct Angio Head W Or Wo Contrast  Result Date: 08/23/2018 CLINICAL DATA:  Follow-up occipital stroke EXAM: CT ANGIOGRAPHY HEAD AND NECK  TECHNIQUE: Multidetector CT imaging of the head and neck was performed using the standard protocol during bolus administration of intravenous contrast. Multiplanar CT image reconstructions and MIPs were obtained to evaluate the vascular anatomy. Carotid stenosis measurements (when applicable) are obtained utilizing NASCET criteria, using the distal internal carotid diameter as the denominator. CONTRAST:  54mL OMNIPAQUE IOHEXOL 350 MG/ML SOLN COMPARISON:  Brain MRI from yesterday FINDINGS: CT HEAD FINDINGS Brain: The patient's occipital cortex infarct is not readily visualized by CT. Mild small vessel ischemic change and cerebral volume loss for age. No acute hemorrhage, hydrocephalus, or masslike finding Vascular: See below Skull: Negative Sinuses: Negative Orbits: Negative Review of the MIP images confirms the above findings CTA NECK FINDINGS Aortic arch: Atherosclerotic plaque. Partially visualized CABG grafts. There is irregular thrombus/plaque at the aortic isthmus along the superior to posterior walls, also involving the stenotic proximal left subclavian artery. Right carotid system: Moderate calcified plaque at the bifurcation and  ICA bulb with 35% stenosis. No ulceration or beading. Left carotid system: Partial retropharyngeal course of the ICA. Calcified plaque at the ICA bulb with up to 50% stenosis as measured on coronal reformats. No ulceration. Vertebral arteries: Irregular plaque/thrombus at the aortic isthmus also involves the left subclavian origin. Left subclavian origin stenosis quantification is limited by streak artifact but estimated at 75% stenosis as measured on coronal reformats. Mild atheromatous narrowing at the left vertebral origin. The vertebral arteries themselves are smooth and patent. There is less intense enhancement within the left vertebral artery, best seen at the V4 segment. Skeleton: Cervical spine degeneration. Other neck: 2.9 cm right thyroid mass which is appropriate for  sonographic follow-up. Upper chest: Negative Review of the MIP images confirms the above findings CTA HEAD FINDINGS Anterior circulation: Atherosclerotic plaque on the carotid siphons without stenosis or ulceration. Aplastic left A1 segment. No branch occlusion, beading, or aneurysm. Posterior circulation: Less intense enhancement of the left vertebral artery attributed to stenotic disease in the neck/chest. Left PCA branch occlusion correlating with the acute infarct. Venous sinuses: Patent Anatomic variants: As above Review of the MIP images confirms the above findings IMPRESSION: 1. Left subclavian origin high-grade stenosis with delayed enhancement of the distal left vertebral artery. Additionally, there is irregular plaque/mural thrombus involving the aortic isthmus and left subclavian origin. 2. Left PCA branch occlusion correlating with the known acute infarct. 3. Cervical carotid atherosclerosis with up to 50% stenosis at the left ICA bulb. 4. 3 cm right thyroid mass, recommend elective ultrasound. Electronically Signed   By: Monte Fantasia M.D.   On: 08/23/2018 10:28   Ct Angio Neck W Or Wo Contrast  Result Date: 08/23/2018 CLINICAL DATA:  Follow-up occipital stroke EXAM: CT ANGIOGRAPHY HEAD AND NECK TECHNIQUE: Multidetector CT imaging of the head and neck was performed using the standard protocol during bolus administration of intravenous contrast. Multiplanar CT image reconstructions and MIPs were obtained to evaluate the vascular anatomy. Carotid stenosis measurements (when applicable) are obtained utilizing NASCET criteria, using the distal internal carotid diameter as the denominator. CONTRAST:  25mL OMNIPAQUE IOHEXOL 350 MG/ML SOLN COMPARISON:  Brain MRI from yesterday FINDINGS: CT HEAD FINDINGS Brain: The patient's occipital cortex infarct is not readily visualized by CT. Mild small vessel ischemic change and cerebral volume loss for age. No acute hemorrhage, hydrocephalus, or masslike finding  Vascular: See below Skull: Negative Sinuses: Negative Orbits: Negative Review of the MIP images confirms the above findings CTA NECK FINDINGS Aortic arch: Atherosclerotic plaque. Partially visualized CABG grafts. There is irregular thrombus/plaque at the aortic isthmus along the superior to posterior walls, also involving the stenotic proximal left subclavian artery. Right carotid system: Moderate calcified plaque at the bifurcation and ICA bulb with 35% stenosis. No ulceration or beading. Left carotid system: Partial retropharyngeal course of the ICA. Calcified plaque at the ICA bulb with up to 50% stenosis as measured on coronal reformats. No ulceration. Vertebral arteries: Irregular plaque/thrombus at the aortic isthmus also involves the left subclavian origin. Left subclavian origin stenosis quantification is limited by streak artifact but estimated at 75% stenosis as measured on coronal reformats. Mild atheromatous narrowing at the left vertebral origin. The vertebral arteries themselves are smooth and patent. There is less intense enhancement within the left vertebral artery, best seen at the V4 segment. Skeleton: Cervical spine degeneration. Other neck: 2.9 cm right thyroid mass which is appropriate for sonographic follow-up. Upper chest: Negative Review of the MIP images confirms the above findings CTA HEAD FINDINGS Anterior  circulation: Atherosclerotic plaque on the carotid siphons without stenosis or ulceration. Aplastic left A1 segment. No branch occlusion, beading, or aneurysm. Posterior circulation: Less intense enhancement of the left vertebral artery attributed to stenotic disease in the neck/chest. Left PCA branch occlusion correlating with the acute infarct. Venous sinuses: Patent Anatomic variants: As above Review of the MIP images confirms the above findings IMPRESSION: 1. Left subclavian origin high-grade stenosis with delayed enhancement of the distal left vertebral artery. Additionally, there  is irregular plaque/mural thrombus involving the aortic isthmus and left subclavian origin. 2. Left PCA branch occlusion correlating with the known acute infarct. 3. Cervical carotid atherosclerosis with up to 50% stenosis at the left ICA bulb. 4. 3 cm right thyroid mass, recommend elective ultrasound. Electronically Signed   By: Monte Fantasia M.D.   On: 08/23/2018 10:28   Mr Brain Wo Contrast  Result Date: 08/22/2018 CLINICAL DATA:  New onset right visual field deficit beginning 2 days ago. EXAM: MRI HEAD WITHOUT CONTRAST TECHNIQUE: Multiplanar, multiecho pulse sequences of the brain and surrounding structures were obtained without intravenous contrast. COMPARISON:  MRI brain 04/17/2013 FINDINGS: Brain: Diffusion-weighted images demonstrate a linear 2 cm acute nonhemorrhagic infarct involving the medial left occipital lobe, likely impacting the visual cortex. No other acute infarct is present. T2 signal changes are associated with the areas of restricted diffusion. Moderate generalized atrophy is present. Moderate periventricular and subcortical T2 changes are noted bilaterally. A remote lacunar infarct is present in the right caudate head. Remote ischemic changes are noted in the thalamus and extend into the brainstem bilaterally. Cerebellum is unremarkable. The ventricles are of normal size. No significant extraaxial fluid collection is present. There is no significant change in the atrophy or matter disease. Vascular: Flow is present in the major intracranial arteries. Skull and upper cervical spine: The craniocervical junction is normal. Upper cervical spine is within normal limits. Marrow signal is unremarkable. Sinuses/Orbits: The paranasal sinuses and mastoid air cells are clear. Bilateral lens replacements are noted. Globes and orbits are otherwise unremarkable. IMPRESSION: 1. Acute/subacute nonhemorrhagic infarct involving the medial left occipital lobe corresponds with the patient's recent visual  changes. 2. Moderate atrophy and white matter disease is otherwise stable. Electronically Signed   By: San Morelle M.D.   On: 08/22/2018 18:32   Vas [place] Lower Extremity Venous (dvt)  Result Date: 08/23/2018  Lower Venous Study Indications: Stroke.  Comparison Study: No prior Performing Technologist: Abram Sander RVS  Examination Guidelines: A complete evaluation includes B-mode imaging, spectral Doppler, color Doppler, and power Doppler as needed of all accessible portions of each vessel. Bilateral testing is considered an integral part of a complete examination. Limited examinations for reoccurring indications may be performed as noted.  +---------+---------------+---------+-----------+----------+-------+ RIGHT    CompressibilityPhasicitySpontaneityPropertiesSummary +---------+---------------+---------+-----------+----------+-------+ CFV      Full           Yes      Yes                          +---------+---------------+---------+-----------+----------+-------+ SFJ      Full                                                 +---------+---------------+---------+-----------+----------+-------+ FV Prox  Full                                                 +---------+---------------+---------+-----------+----------+-------+  FV Mid   Full                                                 +---------+---------------+---------+-----------+----------+-------+ FV DistalFull                                                 +---------+---------------+---------+-----------+----------+-------+ PFV      Full                                                 +---------+---------------+---------+-----------+----------+-------+ POP      Full           Yes      Yes                          +---------+---------------+---------+-----------+----------+-------+ PTV      Full                                                  +---------+---------------+---------+-----------+----------+-------+ PERO     Full                                                 +---------+---------------+---------+-----------+----------+-------+   +---------+---------------+---------+-----------+----------+-------+ LEFT     CompressibilityPhasicitySpontaneityPropertiesSummary +---------+---------------+---------+-----------+----------+-------+ CFV      Full           Yes      Yes                          +---------+---------------+---------+-----------+----------+-------+ SFJ      Full                                                 +---------+---------------+---------+-----------+----------+-------+ FV Prox  Full                                                 +---------+---------------+---------+-----------+----------+-------+ FV Mid   Full                                                 +---------+---------------+---------+-----------+----------+-------+ FV DistalFull                                                 +---------+---------------+---------+-----------+----------+-------+ PFV  Full                                                 +---------+---------------+---------+-----------+----------+-------+ POP      Full           Yes      Yes                          +---------+---------------+---------+-----------+----------+-------+ PTV      Full                                                 +---------+---------------+---------+-----------+----------+-------+ PERO     Full                                                 +---------+---------------+---------+-----------+----------+-------+     Summary: Right: There is no evidence of deep vein thrombosis in the lower extremity. No cystic structure found in the popliteal fossa. Left: There is no evidence of deep vein thrombosis in the lower extremity. No cystic structure found in the popliteal fossa.  *See table(s) above for  measurements and observations. Electronically signed by Monica Martinez MD on 08/23/2018 at 3:40:05 PM.    Final     PHYSICAL EXAM    Temp:  [98.3 F (36.8 C)-99.3 F (37.4 C)] 99 F (37.2 C) (08/14 1147) Pulse Rate:  [60-86] 70 (08/14 1147) Resp:  [14-22] 22 (08/14 1147) BP: (149-225)/(45-95) 150/45 (08/14 1147) SpO2:  [88 %-95 %] 88 % (08/14 1147)  General - Well nourished, well developed, in no apparent distress.  Ophthalmologic - fundi not visualized due to noncooperation.  Cardiovascular - Regular rate and rhythm.  Mental Status -  Level of arousal and orientation to time, place, and person were intact. Language including expression, naming, repetition, comprehension was assessed and found intact. Attention span and concentration were normal. Fund of Knowledge was assessed and was intact.  Cranial Nerves II - XII - II - Visual field intact OU. III, IV, VI - Extraocular movements intact. V - Facial sensation intact bilaterally. VII - Facial movement intact bilaterally. VIII - Hearing & vestibular intact bilaterally. X - Palate elevates symmetrically. XI - Chin turning & shoulder shrug intact bilaterally. XII - Tongue protrusion intact.  Motor Strength - The patient's strength was normal in all extremities and pronator drift was absent.  Bulk was normal and fasciculations were absent.   Motor Tone - Muscle tone was assessed at the neck and appendages and was normal.  Reflexes - The patient's reflexes were symmetrical in all extremities and she had no pathological reflexes.  Sensory - Light touch, temperature/pinprick were assessed and were symmetrical.    Coordination - The patient had normal movements in the hands with no ataxia or dysmetria.  Tremor was absent.  Gait and Station - deferred.   ASSESSMENT/PLAN [name] is a 85 y.o. female with history of DM, diabetic peripheral neuropathy, migraine headaches, HTN, HLD, OSA, autonomic orthostatic  hypotension, asymptomatic carotid stenosis, prior seizure and CAD with prior CABG presenting from opthalmology office  with partial R eye vision loss.   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 Large vessel disease vs. cardioembolic source.  MRI Acute/subacute Medial L occipital infact. Small vessel disease. Atrophy.   CTA head & neck L subclavian high grade stenosis w/ irregular plaque/mural thrombus aortic isthmus and L subclavian origin. L PCA branch occlusion. L ICA 50% stenosis in cervical bulb.    LE Doppler  No DVT  2D Echo EF 60-65%  Loop recorder placed today to rule out afib  LDL 28  HgbA1c 8.7  Lovenox 40 mg sq daily for VTE prophylaxis  aspirin 325 mg daily prior to admission, now on aspirin 325 mg daily and clopidogrel 75 mg daily given left PCA occlusion. Continue DAPT for 3 months and then plavix alone.   Therapy recommendations:  No PT, no OT, no SLP  Disposition:  Return home  Follow up [place] stroke clinic in 4 weeks. Order placed.   Hypertension Autonomic orthostatic hypotension  Stable . Permissive hypertension (OK if < 220/120) but gradually normalize in 3-5 days . Long-term BP goal normotensive  Hyperlipidemia  Home meds:  crestor 10, resumed in hospital then stopped d/t low LDL and added zetia 10  LDL 28, goal < 70  Elevated TG 358  Continue zetia at discharge  Diabetes type II Uncontrolled Diabetic Peripheral Neuropathy  HgbA1c 8.7, goal < 7.0  CBGs  SSI  Close PCP follow up for better DM control.  Other Stroke Risk Factors  Advanced age  Former Cigarette smoker, quit 31 yrs ago  Obesity, Body mass index is 33.93 kg/m., recommend weight loss, diet and exercise as appropriate   Coronary artery disease s/p CABG  Migraines  Obstructive sleep apnea, no on CPAP at home,uses O2 at hs  Other Active Problems  GERD on PPI  Cognitive decline on namenda and aricept  Hospital day # 2  Neurology  will sign off. Please call with questions. Pt will follow up with stroke clinic NP at [facility] in about 4 weeks. Thanks for the consult.   Rosalin Hawking, MD PhD Stroke Neurology 08/24/2018 2:02 PM    To contact Stroke Continuity provider, please refer to [email or web address] After hours, contact General Neurology

## 2018-08-27 ENCOUNTER — Encounter (HOSPITAL_COMMUNITY): Payer: Self-pay | Admitting: Cardiology

## 2018-08-28 DIAGNOSIS — I2721 Secondary pulmonary arterial hypertension: Secondary | ICD-10-CM | POA: Diagnosis not present

## 2018-08-28 DIAGNOSIS — E041 Nontoxic single thyroid nodule: Secondary | ICD-10-CM | POA: Diagnosis not present

## 2018-08-28 DIAGNOSIS — I6990 Unspecified sequelae of unspecified cerebrovascular disease: Secondary | ICD-10-CM | POA: Diagnosis not present

## 2018-08-28 DIAGNOSIS — I771 Stricture of artery: Secondary | ICD-10-CM | POA: Diagnosis not present

## 2018-08-28 DIAGNOSIS — I119 Hypertensive heart disease without heart failure: Secondary | ICD-10-CM | POA: Diagnosis not present

## 2018-08-28 DIAGNOSIS — Z95818 Presence of other cardiac implants and grafts: Secondary | ICD-10-CM | POA: Diagnosis not present

## 2018-08-28 DIAGNOSIS — H547 Unspecified visual loss: Secondary | ICD-10-CM | POA: Diagnosis not present

## 2018-08-28 DIAGNOSIS — Z794 Long term (current) use of insulin: Secondary | ICD-10-CM | POA: Diagnosis not present

## 2018-08-28 DIAGNOSIS — E1149 Type 2 diabetes mellitus with other diabetic neurological complication: Secondary | ICD-10-CM | POA: Diagnosis not present

## 2018-08-28 DIAGNOSIS — D692 Other nonthrombocytopenic purpura: Secondary | ICD-10-CM | POA: Diagnosis not present

## 2018-08-28 DIAGNOSIS — R0602 Shortness of breath: Secondary | ICD-10-CM | POA: Diagnosis not present

## 2018-08-28 DIAGNOSIS — I700 Atherosclerosis of aorta: Secondary | ICD-10-CM | POA: Diagnosis not present

## 2018-08-31 ENCOUNTER — Other Ambulatory Visit: Payer: Self-pay | Admitting: Internal Medicine

## 2018-08-31 DIAGNOSIS — E041 Nontoxic single thyroid nodule: Secondary | ICD-10-CM

## 2018-09-04 ENCOUNTER — Ambulatory Visit (INDEPENDENT_AMBULATORY_CARE_PROVIDER_SITE_OTHER): Payer: Medicare Other | Admitting: *Deleted

## 2018-09-04 ENCOUNTER — Telehealth: Payer: Self-pay

## 2018-09-04 ENCOUNTER — Other Ambulatory Visit: Payer: Self-pay

## 2018-09-04 DIAGNOSIS — I4891 Unspecified atrial fibrillation: Secondary | ICD-10-CM

## 2018-09-04 MED ORDER — APIXABAN 2.5 MG PO TABS: 5.0000 mg | ORAL_TABLET | Freq: Two times a day (BID) | ORAL | Status: DC

## 2018-09-04 NOTE — Telephone Encounter (Signed)

## 2018-09-04 NOTE — Patient Instructions (Signed)
Start Eliquis 5 mg twice a day due to Atrial Fibrillation. Stop Aspirin and plavix. Will start follow up with Atrial Fibrillation clinic in 1 week. Clinic will contact you with appointment date and time.

## 2018-09-05 ENCOUNTER — Telehealth: Payer: Self-pay | Admitting: Cardiology

## 2018-09-05 ENCOUNTER — Telehealth (HOSPITAL_COMMUNITY): Payer: Self-pay | Admitting: Cardiology

## 2018-09-05 LAB — CUP PACEART INCLINIC DEVICE CHECK
Date Time Interrogation Session: 20200826092624
Implantable Pulse Generator Implant Date: 20200814

## 2018-09-05 MED ORDER — APIXABAN 5 MG PO TABS: 5.0000 mg | ORAL_TABLET | Freq: Two times a day (BID) | ORAL | 11 refills | Status: DC

## 2018-09-05 NOTE — Telephone Encounter (Signed)
Per staff message from Jodi Geralds, LPN with Dr. Curt Bears, called patient to schedule appt with AF Clinic.  Left message for patient to call AF Clinic to set up appt.

## 2018-09-05 NOTE — Telephone Encounter (Signed)
Refill sent as requested ./cy 

## 2018-09-05 NOTE — Progress Notes (Signed)
ILR wound check in clinic. Steri strips removed. Wound well healed. R-waves 0.38. Home monitor transmitting nightly. 3 episodes that appear to be AF, the longest episode was 2 hrs and 10 min in duration. Dr Curt Bears given report and discussed AF with patient. ASA and Plavix discontinued and ELiquis ordered. Patient to be referred to AF clinic for f/u next week per Dr Curt Bears. next remote 12/05/18 Questions answered.

## 2018-09-05 NOTE — Telephone Encounter (Signed)
Patient's daughter called stating apixaban (ELIQUIS) tablet 5 mg was to be called to [facility] yesterday and it never was. She would like the script for it to be sent.

## 2018-09-10 NOTE — Telephone Encounter (Signed)
Called and left 2nd message for patient to call A-Fib clinic to schedule appt.

## 2018-09-10 NOTE — Telephone Encounter (Signed)
Patient's daughter, Arletta Bale, returned my call and scheduled appt for 09/12/2018 with Roderic Palau.  Pt's daughter was given clinic info and location.

## 2018-09-12 ENCOUNTER — Encounter (HOSPITAL_COMMUNITY): Payer: Self-pay | Admitting: Nurse Practitioner

## 2018-09-12 ENCOUNTER — Other Ambulatory Visit: Payer: Self-pay

## 2018-09-12 ENCOUNTER — Ambulatory Visit (HOSPITAL_COMMUNITY)
Admission: RE | Admit: 2018-09-12 | Discharge: 2018-09-12 | Disposition: A | Discharge status: Home / Self Care | Payer: Medicare Other | Source: Ambulatory Visit | Attending: Nurse Practitioner | Admitting: Nurse Practitioner

## 2018-09-12 VITALS — BP 130/52 | HR 60 | Ht 60.0 in | Wt 170.0 lb

## 2018-09-12 DIAGNOSIS — I25810 Atherosclerosis of coronary artery bypass graft(s) without angina pectoris: Secondary | ICD-10-CM | POA: Insufficient documentation

## 2018-09-12 DIAGNOSIS — E785 Hyperlipidemia, unspecified: Secondary | ICD-10-CM | POA: Insufficient documentation

## 2018-09-12 DIAGNOSIS — Z91040 Latex allergy status: Secondary | ICD-10-CM | POA: Diagnosis not present

## 2018-09-12 DIAGNOSIS — F419 Anxiety disorder, unspecified: Secondary | ICD-10-CM | POA: Diagnosis not present

## 2018-09-12 DIAGNOSIS — I4510 Unspecified right bundle-branch block: Secondary | ICD-10-CM | POA: Diagnosis not present

## 2018-09-12 DIAGNOSIS — R9431 Abnormal electrocardiogram [ECG] [EKG]: Secondary | ICD-10-CM | POA: Insufficient documentation

## 2018-09-12 DIAGNOSIS — Z951 Presence of aortocoronary bypass graft: Secondary | ICD-10-CM | POA: Diagnosis not present

## 2018-09-12 DIAGNOSIS — G4733 Obstructive sleep apnea (adult) (pediatric): Secondary | ICD-10-CM | POA: Insufficient documentation

## 2018-09-12 DIAGNOSIS — Z87891 Personal history of nicotine dependence: Secondary | ICD-10-CM | POA: Insufficient documentation

## 2018-09-12 DIAGNOSIS — I480 Paroxysmal atrial fibrillation: Secondary | ICD-10-CM | POA: Insufficient documentation

## 2018-09-12 DIAGNOSIS — Z79899 Other long term (current) drug therapy: Secondary | ICD-10-CM | POA: Diagnosis not present

## 2018-09-12 DIAGNOSIS — Z800 Family history of malignant neoplasm of digestive organs: Secondary | ICD-10-CM | POA: Insufficient documentation

## 2018-09-12 DIAGNOSIS — M1990 Unspecified osteoarthritis, unspecified site: Secondary | ICD-10-CM | POA: Diagnosis not present

## 2018-09-12 DIAGNOSIS — Z8249 Family history of ischemic heart disease and other diseases of the circulatory system: Secondary | ICD-10-CM | POA: Diagnosis not present

## 2018-09-12 DIAGNOSIS — Z6833 Body mass index (BMI) 33.0-33.9, adult: Secondary | ICD-10-CM | POA: Diagnosis not present

## 2018-09-12 DIAGNOSIS — E1142 Type 2 diabetes mellitus with diabetic polyneuropathy: Secondary | ICD-10-CM | POA: Insufficient documentation

## 2018-09-12 DIAGNOSIS — Z9049 Acquired absence of other specified parts of digestive tract: Secondary | ICD-10-CM | POA: Diagnosis not present

## 2018-09-12 DIAGNOSIS — K219 Gastro-esophageal reflux disease without esophagitis: Secondary | ICD-10-CM | POA: Diagnosis not present

## 2018-09-12 DIAGNOSIS — Z808 Family history of malignant neoplasm of other organs or systems: Secondary | ICD-10-CM | POA: Insufficient documentation

## 2018-09-12 DIAGNOSIS — M8580 Other specified disorders of bone density and structure, unspecified site: Secondary | ICD-10-CM | POA: Diagnosis not present

## 2018-09-12 DIAGNOSIS — Z7901 Long term (current) use of anticoagulants: Secondary | ICD-10-CM | POA: Diagnosis not present

## 2018-09-12 DIAGNOSIS — F329 Major depressive disorder, single episode, unspecified: Secondary | ICD-10-CM | POA: Insufficient documentation

## 2018-09-12 DIAGNOSIS — E669 Obesity, unspecified: Secondary | ICD-10-CM | POA: Diagnosis not present

## 2018-09-12 DIAGNOSIS — Z886 Allergy status to analgesic agent status: Secondary | ICD-10-CM | POA: Insufficient documentation

## 2018-09-12 NOTE — Progress Notes (Addendum)
Primary Care Physician: Shon Baton, MD Referring Physician:Dr. Curt Bears Neurologist: Dr. Salvadore Farber is a 85 y.o. female with a h/o DM, HTN, CAD with acute CVA early August 2020 with LInq implanted. On f/u in Dr. Macky Lower office, she was found to have 2 hours of afib. HE stopped asa/plavix and started her on eliquis 5 mg bid one week ago. She is here with her son-in-law and feels well. No awareness of afib. Even though her vision improved slightly from initial loss in rt eye, she still has a vision deficient there. Walks with a walker. SIL states that she has had some "soft falls" in the past but no significant falls with head injury.    Today, she denies symptoms of palpitations, chest pain, shortness of breath, orthopnea, PND, lower extremity edema, dizziness, presyncope, syncope, or neurologic sequela. The patient is tolerating medications without difficulties and is otherwise without complaint today.   Past Medical History:  Diagnosis Date  . Anginal pain ([place])    occ  . Anxiety   . Broken ankle   . Bullous pemphigus   . CAD (coronary artery disease)    [place]   80% stenosis ostial Left main, 30% stenosis proximal LAD, 80% stenosis proximal RCA CABG  -  LIMA to LAD, SVG to int, SVG to [place], [place]   . Depression   . Diabetic peripheral neuropathy ([place])   . GERD (gastroesophageal reflux disease)   . Headache(784.0)    hx migraines  . Heart murmur    mvp  . Hyperlipidemia   . Hypertension   . Hypertensive heart disease   . Nasal bleeding    occ due to O2  . OA (osteoarthritis)   . Obesity (BMI 30-39.9)   . Obstructive sleep apnea on CPAP   . Osteopenia   . Peripheral neuropathy   . Seizure ([place])    2009  . Sleep apnea    dx 36yrs ago doesnt use cpap but uses O2 at nite 2L  . Stones in the urinary tract   . Type 2 diabetes mellitus with vascular disease ([place])   . UTI (lower urinary tract infection)    Past Surgical History:  Procedure  Laterality Date  . ABDOMINAL HYSTERECTOMY  70's  . CARDIAC CATHETERIZATION    . CARPAL TUNNEL RELEASE Bilateral   . CHOLECYSTECTOMY    . CORONARY ARTERY BYPASS GRAFT  96  . LOOP RECORDER INSERTION N/A 08/24/2018   Procedure: LOOP RECORDER INSERTION;  Surgeon: Constance Haw, MD;  Location: [facility];  Service: Cardiovascular;  Laterality: N/A;  . SHOULDER ARTHROSCOPY     rt  . SHOULDER ARTHROSCOPY WITH ROTATOR CUFF REPAIR AND SUBACROMIAL DECOMPRESSION  12/01/2011   Procedure: SHOULDER ARTHROSCOPY WITH ROTATOR CUFF REPAIR AND SUBACROMIAL DECOMPRESSION;  Surgeon: Nita Sells, MD;  Location: [facility];  Service: Orthopedics;  Laterality: Left;  shoulder debridement calcific tendonitis    Current Outpatient Medications  Medication Sig Dispense Refill  . acetaminophen (TYLENOL) 500 MG tablet Take 500 mg by mouth every 4 (four) hours as needed for moderate pain.    Marland Kitchen amoxicillin (AMOXIL) 500 MG capsule Take 2,000 mg by mouth See admin instructions. Take 2,000 mg by mouth one hour prior to dental appointments or procedures- as directed    . apixaban (ELIQUIS) 5 MG TABS tablet Take 1 tablet (5 mg total) by mouth 2 (two) times daily. 60 tablet 11  . busPIRone (BUSPAR) 7.5  MG tablet Take 7.5 mg by mouth 2 (two) times daily.    . cyanocobalamin 1000 MCG tablet Take 1,000 mcg by mouth daily.    . DULoxetine (CYMBALTA) 60 MG capsule Take 60 mg by mouth daily.    Marland Kitchen ezetimibe (ZETIA) 10 MG tablet Take 1 tablet (10 mg total) by mouth daily. 30 tablet 0  . fesoterodine (TOVIAZ) 8 MG TB24 tablet Take 8 mg by mouth every evening.     . fluticasone (FLONASE) 50 MCG/ACT nasal spray Place 1 spray into both nostrils 2 (two) times daily as needed for allergies or rhinitis.    Marland Kitchen gabapentin (NEURONTIN) 300 MG capsule Take 300 mg by mouth 2 (two) times daily.    Marland Kitchen glimepiride (AMARYL) 1 MG tablet Take 1 mg by mouth daily.    . hydrOXYzine (ATARAX/VISTARIL) 25 MG tablet Take 50 mg by mouth every  evening.     . Insulin Glargine (LANTUS SOLOSTAR) 100 UNIT/ML Solostar Pen Inject 40 Units into the skin daily before breakfast.     . lactobacillus acidophilus (BACID) TABS tablet Take 1 tablet by mouth daily.    . metoprolol succinate (TOPROL-XL) 25 MG 24 hr tablet Take 12.5 mg by mouth 2 (two) times daily.    Marland Kitchen NAMZARIC 28-10 MG CP24 TAKE 1 CAPSULE DAILY (Patient taking differently: Take 1 capsule by mouth every evening. ) 90 capsule 3  . pantoprazole (PROTONIX) 40 MG tablet Take 40 mg by mouth every evening.     Marland Kitchen Phenylephrine HCl (SINEX REGULAR NA) Place 1 spray into both nostrils as needed (for congestion).    . rosuvastatin (CRESTOR) 10 MG tablet Take 10 mg by mouth daily before supper.    . sitaGLIPtin (JANUVIA) 100 MG tablet Take 100 mg by mouth daily.    . valsartan-hydrochlorothiazide (DIOVAN-HCT) 160-25 MG tablet Take 0.5 tablets by mouth daily. 90 tablet 1   No current facility-administered medications for this encounter.     Allergies  Allergen Reactions  . Percocet [Oxycodone-Acetaminophen] Nausea And Vomiting  . Latex Itching and Rash    EKG leads    Social History   Socioeconomic History  . Marital status: Widowed    Spouse name: Not on file  . Number of children: 1  . Years of education: 96  . Highest education level: Not on file  Occupational History  . Not on file  Social Needs  . Financial resource strain: Not on file  . Food insecurity    Worry: Not on file    Inability: Not on file  . Transportation needs    Medical: Not on file    Non-medical: Not on file  Tobacco Use  . Smoking status: Former Smoker    Packs/day: 1.00    Years: 30.00    Pack years: 30.00    Types: Cigarettes    Quit date: 11/25/1986    Years since quitting: 31.8  . Smokeless tobacco: Never Used  Substance and Sexual Activity  . Alcohol use: No  . Drug use: No  . Sexual activity: Yes    Birth control/protection: Post-menopausal  Lifestyle  . Physical activity    Days per  week: Not on file    Minutes per session: Not on file  . Stress: Not on file  Relationships  . Social Herbalist on phone: Not on file    Gets together: Not on file    Attends religious service: Not on file    Active member of club or  organization: Not on file    Attends meetings of clubs or organizations: Not on file    Relationship status: Not on file  . Intimate partner violence    Fear of current or ex partner: Not on file    Emotionally abused: Not on file    Physically abused: Not on file    Forced sexual activity: Not on file  Other Topics Concern  . Not on file  Social History Narrative   Married, has 1 child   Caffeine use: none    Family History  Problem Relation Age of Onset  . Coronary artery disease Mother   . Heart disease Father   . Cancer Father        skin  . Cancer Brother   . Cancer - Colon Brother        brain, Lymphoma    ROS- All systems are reviewed and negative except as per the HPI above  Physical Exam: Vitals:   09/12/18 1011  BP: (!) 130/52  Pulse: 60  Weight: 77.1 kg  Height: 5' (1.524 m)   Wt Readings from Last 3 Encounters:  09/12/18 77.1 kg  08/23/18 78.8 kg  08/16/18 78.5 kg    Labs: Lab Results  Component Value Date   NA 142 08/23/2018   K 3.7 08/23/2018   CL 105 08/23/2018   CO2 28 08/23/2018   GLUCOSE 150 (H) 08/23/2018   BUN 15 08/23/2018   CREATININE 0.90 08/23/2018   CALCIUM 8.5 (L) 08/23/2018   MG 2.0 09/21/2016   Lab Results  Component Value Date   INR 1.1 08/22/2018   Lab Results  Component Value Date   CHOL 127 08/23/2018   HDL 27 (L) 08/23/2018   LDLCALC 28 08/23/2018   TRIG 358 (H) 08/23/2018     GEN- The patient is well appearing, alert and oriented x 3 today.   Head- normocephalic, atraumatic Eyes-  Sclera clear, conjunctiva pink Ears- hearing intact Oropharynx- clear Neck- supple, no JVP Lymph- no cervical lymphadenopathy Lungs- Clear to ausculation bilaterally, normal work of  breathing Heart- Regular rate and rhythm, no murmurs, rubs or gallops, PMI not laterally displaced GI- soft, NT, ND, + BS Extremities- no clubbing, cyanosis, or edema MS- no significant deformity or atrophy Skin- no rash or lesion Psych- euthymic mood, full affect Neuro- strength and sensation are intact  EKG- probable SR at 60 bpm RBBB, qrs int 126 ms, qtc 474 Echo- 1. The left ventricle has normal systolic function with an ejection fraction of 60-65%. The cavity size was normal. Left ventricular diastolic Doppler parameters are consistent with impaired relaxation. No evidence of left ventricular regional wall  motion abnormalities.  2. The right ventricle has normal systolic function. The cavity was normal. There is no increase in right ventricular wall thickness.  3. Left atrial size was mildly dilated.  4. There is mild mitral annular calcification present. No evidence of mitral valve stenosis. Trivial mitral regurgitation.  5. The aortic valve is tricuspid. Mild calcification of the aortic valve. Aortic valve regurgitation is mild to moderate by color flow Doppler. No stenosis of the aortic valve.  6. The aorta is normal unless otherwise noted.  7. Normal IVC size. PA systolic pressure 39 mmHg.   Assessment and Plan: 1. Paroxysmal afib  In the setting of recent CVA and Linq implant Pt was unaware of afib occurrence  Is already on metoprolol 12.5 mg bid  Continue  paceart reports thru Dr. Curt Bears office   2. CHA2DS2VAS  score of at least 6 Now off asa/plavix and is on eliquis  5 mg bid Appropriately dosed  Bleeding precautions discussed   F/u in 3 weeks for repeat cbc/bmet Nest appointment with Frann Rider, NP, 09/25/18  Geroge Baseman. Narcissus Detwiler, [facility] [address] [place], [place] [address] [phone]

## 2018-09-13 ENCOUNTER — Telehealth: Payer: Self-pay | Admitting: Cardiology

## 2018-09-13 MED ORDER — APIXABAN 5 MG PO TABS: 5.0000 mg | ORAL_TABLET | Freq: Two times a day (BID) | ORAL | 3 refills | Status: DC

## 2018-09-13 NOTE — Telephone Encounter (Signed)
Eliquis 5mg  refill request received; pt is 85yrs old, weight-78.5kg, Crea-0.90 on 08/23/2018, Diagnosis-Afib, and last seen by Roderic Palau on 09/12/2018. Dose is appropriate based on dosing criteria. Will send in refill to requested pharmacy.  The refill was originally sent to [facility] by Devra Dopp on 09/05/2018 but pt is requesting 90 day supply to mail order; will send.

## 2018-09-13 NOTE — Telephone Encounter (Signed)
° °*  STAT* If patient is at the pharmacy, call can be transferred to refill team.   1. Which medications need to be refilled? (please list name of each medication and dose if known)  apixaban (ELIQUIS) 5 MG TABS tablet 2x daily  2. Which pharmacy/location (including street and city if local pharmacy) is medication to be sent to? [facility] [place], [place]  3. Do they need a 30 day or 90 day supply? 11   Daughter called and said the initial rx was sent to [facility]. For long term medicine, it is cheaper for the patient to get her medication through [facility]. The daughter says that the patient has about two weeks left of medication, and she wants to get the mail order process started as soon as possible

## 2018-09-18 NOTE — Telephone Encounter (Signed)
[facility]. They will send out to patient. Patient notified.

## 2018-09-18 NOTE — Telephone Encounter (Signed)
Follow up      Pts daughter is calling and says [facility] needs more information from their Doctor before they will send the medication. She says the pharmacy has tried to contact us and is asking [place] to call them  Please call  [phone]

## 2018-09-21 DIAGNOSIS — Z23 Encounter for immunization: Secondary | ICD-10-CM | POA: Diagnosis not present

## 2018-09-25 ENCOUNTER — Other Ambulatory Visit: Payer: Self-pay

## 2018-09-25 ENCOUNTER — Encounter: Payer: Self-pay | Admitting: Adult Health

## 2018-09-25 ENCOUNTER — Ambulatory Visit (INDEPENDENT_AMBULATORY_CARE_PROVIDER_SITE_OTHER): Payer: Medicare Other | Admitting: Adult Health

## 2018-09-25 VITALS — BP 136/50 | HR 63 | Temp 98.2°F | Ht 60.0 in | Wt 172.4 lb

## 2018-09-25 DIAGNOSIS — E1159 Type 2 diabetes mellitus with other circulatory complications: Secondary | ICD-10-CM

## 2018-09-25 DIAGNOSIS — I10 Essential (primary) hypertension: Secondary | ICD-10-CM

## 2018-09-25 DIAGNOSIS — E785 Hyperlipidemia, unspecified: Secondary | ICD-10-CM | POA: Diagnosis not present

## 2018-09-25 DIAGNOSIS — I4891 Unspecified atrial fibrillation: Secondary | ICD-10-CM | POA: Diagnosis not present

## 2018-09-25 DIAGNOSIS — H53461 Homonymous bilateral field defects, right side: Secondary | ICD-10-CM | POA: Diagnosis not present

## 2018-09-25 DIAGNOSIS — I63532 Cerebral infarction due to unspecified occlusion or stenosis of left posterior cerebral artery: Secondary | ICD-10-CM

## 2018-09-25 NOTE — Patient Instructions (Signed)
Recommend following up with your eye doctor who will further determine overall clearance of returning to drive.  You have mild residual visual loss in the right lower corner with a good chance of continued improvement that should not interfere with driving based on exam during visit.  Continue Eliquis (apixaban) daily  and Crestor/Zetia for secondary stroke prevention  Continue to follow with cardiology for recent finding of atrial fibrillation and Eliquis management  Continue to follow up with PCP regarding cholesterol, blood pressure and diabetes management   Continue to monitor blood pressure at home  Continue to use rolling walker at all times for fall prevention  Maintain strict control of hypertension with blood pressure goal below 130/90, diabetes with hemoglobin A1c goal below 6.5% and cholesterol with LDL cholesterol (bad cholesterol) goal below 70 mg/dL. I also advised the patient to eat a healthy diet with plenty of whole grains, cereals, fruits and vegetables, exercise regularly and maintain ideal body weight.  Followup in the future with me in 3 months or call earlier if needed       Thank you for coming to see [place] at [facility]. I hope we have been able to provide you high quality care today.  You may receive a patient satisfaction survey over the next few weeks. We would appreciate your feedback and comments so that we may continue to improve ourselves and the health of our patients.

## 2018-09-25 NOTE — Progress Notes (Signed)
[facility] [address] [place]. [place] [address] [phone]       HOSPITAL FOLLOW UP NOTE  [name] Date of Birth:  01/30/1932 Medical Record Number:  [id number]   Reason for Referral:  hospital stroke follow up    CHIEF COMPLAINT:  Chief Complaint  Patient presents with   Hospitalization Follow-up    Alone. Rm 9. No new concerns at this time.     HPI: Linda Oliver being seen today for in office hospital follow-up regarding left occipital infarct secondary to large vessel disease versus cardioembolic source on 3/38/2505.  History obtained from patient and chart review. Reviewed all radiology images and labs personally.  [name] is a 85 y.o. female with history of DM, diabetic peripheral neuropathy, migraine headaches, HTN, HLD, OSA, autonomic orthostatic hypotension, asymptomatic carotid stenosis, prior seizure and CAD with prior CABG who presented to [facility] ED on 08/22/2018 from opthalmology office with partial R eye vision loss. Dr. Erlinda Hong and stroke team consulted with stroke work-up revealing medial left occipital infarct embolic pattern as evidenced on MRI secondary to unknown source possibly large vessel disease versus cardioembolic source.  CTA head/neck showed left subclavian high-grade stenosis with irregular plaque/mural thrombus aortic isthmus and left subclavian origin, left PCA branch occlusion and left ICA 50% stenosis and cervical bulb.  Lower extremity venous Dopplers negative for DVT.  2D echo normal EF without cardiac source of embolus or PFO.  Loop recorder placed to rule out atrial fibrillation.  LDL 28.  A1c 8.7.  Recommended DAPT for 3 months then Plavix alone.  HTN stable.  Satisfactory LDL but elevated triglyceride and recommended Zetia 10 mg daily.  Uncontrolled DM with elevated A1c and history of diabetic peripheral neuropathy.  Other stroke risk factors include advanced age, former tobacco use, obesity, CAD status post CABG,  migraines and OSA.  Underlying history of cognitive decline on Namenda and Aricept.  Discharged home in stable condition without therapy needs.  Linda Oliver is being seen today for hospital follow-up.  Residual deficits of mild vision loss but endorses improvement.  She has followed up with her ophthalmologist since discharge.  She continues to ambulate with a rolling walker due to occasional dizziness which was present prior to her stroke.  She denies any recent falls.  Loop recorder did show evidence of atrial fibrillation therefore Eliquis 5 mg twice daily initiated on 09/13/2018 with discontinuing aspirin and Plavix.  She has continued on Eliquis without bleeding or bruising.  She continues on Crestor and Zetia without side effects.  Blood pressure today 136/50.  Continues on Namzaric for underlying dementia denies worsening of cognition since recent stroke.  Denies new or worsening stroke/TIA symptoms.    ROS:   14 system review of systems performed and negative with exception of visual loss  PMH:  Past Medical History:  Diagnosis Date   Anginal pain (HCC)    occ   Anxiety    Broken ankle    Bullous pemphigus    CAD (coronary artery disease)    [place]   80% stenosis ostial Left main, 30% stenosis proximal LAD, 80% stenosis proximal RCA CABG  -  LIMA to LAD, SVG to int, SVG to [place], [place]    Depression    Diabetic peripheral neuropathy (HCC)    GERD (gastroesophageal reflux disease)    Headache(784.0)    hx migraines   Heart murmur    mvp   Hyperlipidemia  Hypertension    Hypertensive heart disease    Nasal bleeding    occ due to O2   OA (osteoarthritis)    Obesity (BMI 30-39.9)    Obstructive sleep apnea on CPAP    Osteopenia    Peripheral neuropathy    Seizure ([place])    2009   Sleep apnea    dx 76yrs ago doesnt use cpap but uses O2 at nite 2L   Stones in the urinary tract    Type 2 diabetes mellitus with vascular disease  ([place])    UTI (lower urinary tract infection)     PSH:  Past Surgical History:  Procedure Laterality Date   ABDOMINAL HYSTERECTOMY  70's   CARDIAC CATHETERIZATION     CARPAL TUNNEL RELEASE Bilateral    CHOLECYSTECTOMY     CORONARY ARTERY BYPASS GRAFT  96   LOOP RECORDER INSERTION N/A 08/24/2018   Procedure: LOOP RECORDER INSERTION;  Surgeon: Constance Haw, MD;  Location: [facility];  Service: Cardiovascular;  Laterality: N/A;   SHOULDER ARTHROSCOPY     rt   SHOULDER ARTHROSCOPY WITH ROTATOR CUFF REPAIR AND SUBACROMIAL DECOMPRESSION  12/01/2011   Procedure: SHOULDER ARTHROSCOPY WITH ROTATOR CUFF REPAIR AND SUBACROMIAL DECOMPRESSION;  Surgeon: Nita Sells, MD;  Location: [facility];  Service: Orthopedics;  Laterality: Left;  shoulder debridement calcific tendonitis    Social History:  Social History   Socioeconomic History   Marital status: Widowed    Spouse name: Not on file   Number of children: 1   Years of education: 67   Highest education level: Not on file  Occupational History   Not on file  Social Needs   Financial resource strain: Not on file   Food insecurity    Worry: Not on file    Inability: Not on file   Transportation needs    Medical: Not on file    Non-medical: Not on file  Tobacco Use   Smoking status: Former Smoker    Packs/day: 1.00    Years: 30.00    Pack years: 30.00    Types: Cigarettes    Quit date: 11/25/1986    Years since quitting: 31.8   Smokeless tobacco: Never Used  Substance and Sexual Activity   Alcohol use: No   Drug use: No   Sexual activity: Yes    Birth control/protection: Post-menopausal  Lifestyle   Physical activity    Days per week: Not on file    Minutes per session: Not on file   Stress: Not on file  Relationships   Social connections    Talks on phone: Not on file    Gets together: Not on file    Attends religious service: Not on file    Active member of club or  organization: Not on file    Attends meetings of clubs or organizations: Not on file    Relationship status: Not on file   Intimate partner violence    Fear of current or ex partner: Not on file    Emotionally abused: Not on file    Physically abused: Not on file    Forced sexual activity: Not on file  Other Topics Concern   Not on file  Social History Narrative   Married, has 1 child   Caffeine use: none    Family History:  Family History  Problem Relation Age of Onset   Coronary artery disease Mother    Heart disease Father    Cancer Father  skin   Cancer Brother    Cancer - Colon Brother        brain, Lymphoma    Medications:   Current Outpatient Medications on File Prior to Visit  Medication Sig Dispense Refill   acetaminophen (TYLENOL) 500 MG tablet Take 500 mg by mouth every 4 (four) hours as needed for moderate pain.     apixaban (ELIQUIS) 5 MG TABS tablet Take 1 tablet (5 mg total) by mouth 2 (two) times daily. 180 tablet 3   busPIRone (BUSPAR) 7.5 MG tablet Take 7.5 mg by mouth 2 (two) times daily.     cyanocobalamin 1000 MCG tablet Take 1,000 mcg by mouth daily.     DULoxetine (CYMBALTA) 60 MG capsule Take 60 mg by mouth daily.     ezetimibe (ZETIA) 10 MG tablet Take 1 tablet (10 mg total) by mouth daily. 30 tablet 0   fesoterodine (TOVIAZ) 8 MG TB24 tablet Take 8 mg by mouth every evening.      fluticasone (FLONASE) 50 MCG/ACT nasal spray Place 1 spray into both nostrils 2 (two) times daily as needed for allergies or rhinitis.     gabapentin (NEURONTIN) 300 MG capsule Take 300 mg by mouth 2 (two) times daily.     glimepiride (AMARYL) 1 MG tablet Take 1 mg by mouth daily.     hydrOXYzine (ATARAX/VISTARIL) 25 MG tablet Take 50 mg by mouth every evening.      Insulin Glargine (LANTUS SOLOSTAR) 100 UNIT/ML Solostar Pen Inject 40 Units into the skin daily before breakfast.      lactobacillus acidophilus (BACID) TABS tablet Take 1 tablet by  mouth daily.     metoprolol succinate (TOPROL-XL) 25 MG 24 hr tablet Take 12.5 mg by mouth 2 (two) times daily.     NAMZARIC 28-10 MG CP24 TAKE 1 CAPSULE DAILY (Patient taking differently: Take 1 capsule by mouth every evening. ) 90 capsule 3   rosuvastatin (CRESTOR) 10 MG tablet Take 10 mg by mouth daily before supper.     sitaGLIPtin (JANUVIA) 100 MG tablet Take 100 mg by mouth daily.     valsartan-hydrochlorothiazide (DIOVAN-HCT) 160-25 MG tablet Take 0.5 tablets by mouth daily. 90 tablet 1   amoxicillin (AMOXIL) 500 MG capsule Take 2,000 mg by mouth See admin instructions. Take 2,000 mg by mouth one hour prior to dental appointments or procedures- as directed     pantoprazole (PROTONIX) 40 MG tablet Take 40 mg by mouth every evening.      Phenylephrine HCl (SINEX REGULAR NA) Place 1 spray into both nostrils as needed (for congestion).     No current facility-administered medications on file prior to visit.     Allergies:   Allergies  Allergen Reactions   Percocet [Oxycodone-Acetaminophen] Nausea And Vomiting   Latex Itching and Rash    EKG leads     Physical Exam  Vitals:   09/25/18 1519  BP: (!) 136/50  Pulse: 63  Temp: 98.2 F (36.8 C)  TempSrc: Oral  Weight: 172 lb 6.4 oz (78.2 kg)  Height: 5' (1.524 m)   Body mass index is 33.67 kg/m. No exam data present  No flowsheet data found.   General: well developed, well nourished,  very pleasant elderly Caucasian female, seated, in no evident distress Head: head normocephalic and atraumatic.   Neck: supple with no carotid or supraclavicular bruits Cardiovascular: regular rate and rhythm, no murmurs Musculoskeletal: no deformity Skin:  no rash/petichiae Vascular:  Normal pulses all extremities   Neurologic Exam  Mental Status: Awake and fully alert. Oriented to place and time. Recent and remote memory intact. Attention span, concentration and fund of knowledge appropriate. Mood and affect appropriate.    Cranial Nerves: Fundoscopic exam reveals sharp disc margins. Pupils equal, briskly reactive to light. Extraocular movements full without nystagmus. Visual fields mild homonymous inferior quadrantanopia. Hearing intact. Facial sensation intact. Face, tongue, palate moves normally and symmetrically.  Motor: Normal bulk and tone. Normal strength in all tested extremity muscles except mild bilateral hip flexor weakness. Sensory.: intact to touch , pinprick , position and vibratory sensation.  Coordination: Rapid alternating movements normal in all extremities. Finger-to-nose and heel-to-shin performed accurately bilaterally. Gait and Station: Arises from chair without difficulty. Stance is normal. Gait demonstrates normal stride length and balance with use of rolling walker Reflexes: 1+ and symmetric. Toes downgoing.     NIHSS  1 Modified Rankin  2 CHA2DS2-VASc 8 HAS-BLED 2   Diagnostic Data (Labs, Imaging, Testing)   MRI Acute/subacute Medial L occipital infact. Small vessel disease. Atrophy.   CTA head & neck L subclavian high grade stenosis w/ irregular plaque/mural thrombus aortic isthmus and L subclavian origin. L PCA branch occlusion. L ICA 50% stenosis in cervical bulb.    LE Doppler  No DVT  2D Echo EF 60-65%  Loop recorder placed today to rule out afib  LDL 28  HgbA1c 8.7    ASSESSMENT: VERIA STRADLEY is a 85 y.o. year old female presented with right eye vision loss on 08/22/2018 with stroke work-up revealing medial left occipital infarct secondary to large vessel disease versus cardioembolic source.  Loop recorder place for monitoring potential atrial fibrillation.  Vascular risk factors include DM, HTN, HLD, OSA, carotid stenosis, CAD status post CABG and migraines.  Residual deficits of mild right inferior quadrantanopia but does endorse improvement.  Loop recorder showed evidence of atrial fibrillation therefore Eliquis initiated    PLAN:  1. Left occipital infarct:  Continue Eliquis (apixaban) daily  and Crestor/Zetia for secondary stroke prevention. Maintain strict control of hypertension with blood pressure goal below 130/90, diabetes with hemoglobin A1c goal below 6.5% and cholesterol with LDL cholesterol (bad cholesterol) goal below 70 mg/dL.  I also advised the patient to eat a healthy diet with plenty of whole grains, cereals, fruits and vegetables, exercise regularly with at least 30 minutes of continuous activity daily and maintain ideal body weight. 2. Right homonymous inferior quadrantanopia: Very mild with patient endorsing improvement since hospitalization.  Advised to continue to follow with ophthalmology who will provide an official clearance for driving but per office exam, no concerns of patient returning to driving 3. Atrial fibrillation: New diagnosis found on loop recorder.  Continue Eliquis for secondary stroke prevention and ongoing follow-up with cardiology 4. HTN: Advised to continue current treatment regimen.  Today's BP stable.  Advised to continue to monitor at home along with continued follow-up with PCP for management 5. HLD: Advised to continue current treatment regimen along with continued follow-up with PCP for future prescribing and monitoring of lipid panel 6. DMII: Advised to continue to monitor glucose levels at home along with continued follow-up with PCP for management and monitoring    Follow up in 3 months or call earlier if needed   Greater than 50% of time during this 45 minute visit was spent on counseling, explanation of diagnosis of left occipital infarct, reviewing risk factor management of new diagnosis of atrial fibrillation, HTN, HLD and DM, planning of further management along with potential future management,  and discussion with patient and family answering all questions.    Frann Rider, AGNP-BC  [facility] [address] [place], [place] [address]  Phone [phone] Fax  [phone] Note: This document was prepared with digital dictation and possible smart phrase technology. Any transcriptional errors that result from this process are unintentional.

## 2018-09-26 NOTE — Progress Notes (Signed)
I agree with the above plan 

## 2018-09-27 ENCOUNTER — Ambulatory Visit (INDEPENDENT_AMBULATORY_CARE_PROVIDER_SITE_OTHER): Payer: Medicare Other | Admitting: *Deleted

## 2018-09-27 DIAGNOSIS — I639 Cerebral infarction, unspecified: Secondary | ICD-10-CM

## 2018-09-27 LAB — CUP PACEART REMOTE DEVICE CHECK
Date Time Interrogation Session: 20200917110723
Implantable Pulse Generator Implant Date: 20200814

## 2018-10-01 NOTE — Progress Notes (Signed)
Carelink Summary Report / Loop Recorder 

## 2018-10-03 ENCOUNTER — Ambulatory Visit (HOSPITAL_COMMUNITY): Payer: Medicare Other | Admitting: Nurse Practitioner

## 2018-10-04 ENCOUNTER — Other Ambulatory Visit: Payer: Self-pay

## 2018-10-04 ENCOUNTER — Ambulatory Visit (HOSPITAL_COMMUNITY)
Admission: RE | Admit: 2018-10-04 | Discharge: 2018-10-04 | Disposition: A | Discharge status: Home / Self Care | Payer: Medicare Other | Source: Ambulatory Visit | Attending: Nurse Practitioner | Admitting: Nurse Practitioner

## 2018-10-04 DIAGNOSIS — I4819 Other persistent atrial fibrillation: Secondary | ICD-10-CM | POA: Insufficient documentation

## 2018-10-04 LAB — CBC
HCT: 37.5 % (ref 36.0–46.0)
Hemoglobin: 11.2 g/dL — ABNORMAL LOW (ref 12.0–15.0)
MCH: 26 pg (ref 26.0–34.0)
MCHC: 29.9 g/dL — ABNORMAL LOW (ref 30.0–36.0)
MCV: 87 fL (ref 80.0–100.0)
Platelets: 279 10*3/uL (ref 150–400)
RBC: 4.31 MIL/uL (ref 3.87–5.11)
RDW: 15.2 % (ref 11.5–15.5)
WBC: 9.3 10*3/uL (ref 4.0–10.5)
nRBC: 0 % (ref 0.0–0.2)

## 2018-10-04 LAB — BASIC METABOLIC PANEL
Anion gap: 12 (ref 5–15)
BUN: 17 mg/dL (ref 8–23)
CO2: 24 mmol/L (ref 22–32)
Calcium: 8.7 mg/dL — ABNORMAL LOW (ref 8.9–10.3)
Chloride: 101 mmol/L (ref 98–111)
Creatinine, Ser: 1.27 mg/dL — ABNORMAL HIGH (ref 0.44–1.00)
GFR calc Af Amer: 45 mL/min — ABNORMAL LOW (ref >60)
GFR calc non Af Amer: 39 mL/min — ABNORMAL LOW (ref >60)
Glucose, Bld: 336 mg/dL — ABNORMAL HIGH (ref 70–99)
Potassium: 4.2 mmol/L (ref 3.5–5.1)
Sodium: 137 mmol/L (ref 135–145)

## 2018-10-23 ENCOUNTER — Telehealth: Payer: Self-pay | Admitting: Neurology

## 2018-10-23 NOTE — Telephone Encounter (Signed)
Pts daughter Freda Munro [facility]) called in and stated that the pt is stating she is having hallucinations that people that are not there are there, she wants to know what can trigger this .She is requesting a call back to discuss.

## 2018-10-23 NOTE — Telephone Encounter (Signed)
I called pt's daughter and LVM (ok per DPR) advising if pt's hallucinations are new we recommend she see PCP first to be sure this is not medication related or from some other cause. If PCP wants pt to be seen by neurology we can schedule an appointment. Left office number in message for call back to let us know her thoughts.

## 2018-10-23 NOTE — Telephone Encounter (Signed)
Spoke with Dr. Jaynee Eagles. If new symptoms, see PCP to make sure this is not medication related or other cause.

## 2018-10-23 NOTE — Telephone Encounter (Signed)
I spoke with pt's daughter Freda Munro. She stated the other day the pt told her an old friend had been sitting in the kitchen talking to her. She also said that the pt's husband has been visiting her the same way. The pt's husband had passed in 2022/05/23 or 06/22/2022 of this year. She stated the pt said the visits do not bother her as long as they "do not become violent". Freda Munro stated she called Dr. Virgina Jock first and they suggested she call Dr. Jaynee Eagles. Since then she has called them back to request a medication review. She is waiting to hear back. She stated the pt seems to be doing well otherwise. She did not have the formal memory testing d/t COVID concerns. She is unsure if the hallucinations have anything to do with stress, dementia, being home so much the last 6 months. She went ahead and scheduled an appt with Dr. Jaynee Eagles for next [facility] 10/20 @ 11:30 AM arrival 11:00. She will call back if Dr. Virgina Jock has any suggestions that would change this plan. She verbalized appreciation for the call.

## 2018-10-25 ENCOUNTER — Other Ambulatory Visit: Payer: Self-pay

## 2018-10-25 ENCOUNTER — Ambulatory Visit (INDEPENDENT_AMBULATORY_CARE_PROVIDER_SITE_OTHER): Payer: Medicare Other

## 2018-10-25 DIAGNOSIS — I6523 Occlusion and stenosis of bilateral carotid arteries: Secondary | ICD-10-CM | POA: Diagnosis not present

## 2018-10-28 ENCOUNTER — Other Ambulatory Visit: Payer: Self-pay | Admitting: Cardiology

## 2018-10-28 DIAGNOSIS — I6523 Occlusion and stenosis of bilateral carotid arteries: Secondary | ICD-10-CM

## 2018-10-29 ENCOUNTER — Ambulatory Visit (INDEPENDENT_AMBULATORY_CARE_PROVIDER_SITE_OTHER): Payer: Medicare Other | Admitting: *Deleted

## 2018-10-29 DIAGNOSIS — I639 Cerebral infarction, unspecified: Secondary | ICD-10-CM | POA: Diagnosis not present

## 2018-10-30 ENCOUNTER — Ambulatory Visit: Payer: Self-pay | Admitting: Neurology

## 2018-10-30 LAB — CUP PACEART REMOTE DEVICE CHECK
Date Time Interrogation Session: 20201019234540
Implantable Pulse Generator Implant Date: 20200814

## 2018-11-08 ENCOUNTER — Other Ambulatory Visit: Payer: Self-pay | Admitting: Neurology

## 2018-11-16 NOTE — Progress Notes (Signed)
Carelink Summary Report / Loop Recorder 

## 2018-11-19 ENCOUNTER — Telehealth: Payer: Self-pay | Admitting: *Deleted

## 2018-11-19 NOTE — Telephone Encounter (Signed)
Noted  

## 2018-11-19 NOTE — Telephone Encounter (Signed)
I spoke with the pt's daughter Freda Munro (on [place]) to discuss combining pt's two appts next week into one. Per Freda Munro, pt reports she has not had anymore hallucinations. Freda Munro feels pt is very stable from a stroke perspective. Vision seems better. Pt is driving a little (ex. Over to daughter's house), and daughter states she appears to be as good as she was before. Freda Munro has no questions or concerns at this time regarding stroke. Pt will be seen by Jinny Blossom NP for sleep and memory f/u on 11/18 @ 3 pm. I canceled the 11/16 appt.

## 2018-11-19 NOTE — Telephone Encounter (Signed)
That is fine. We can have her follow up as needed regarding stroke. Thank you for the update!

## 2018-11-22 ENCOUNTER — Telehealth (INDEPENDENT_AMBULATORY_CARE_PROVIDER_SITE_OTHER): Payer: Medicare Other | Admitting: Cardiology

## 2018-11-22 ENCOUNTER — Encounter: Payer: Self-pay | Admitting: Cardiology

## 2018-11-22 ENCOUNTER — Other Ambulatory Visit: Payer: Self-pay

## 2018-11-22 VITALS — BP 164/79 | HR 70 | Ht 60.0 in | Wt 167.2 lb

## 2018-11-22 DIAGNOSIS — I2510 Atherosclerotic heart disease of native coronary artery without angina pectoris: Secondary | ICD-10-CM | POA: Diagnosis not present

## 2018-11-22 DIAGNOSIS — I771 Stricture of artery: Secondary | ICD-10-CM

## 2018-11-22 DIAGNOSIS — I6523 Occlusion and stenosis of bilateral carotid arteries: Secondary | ICD-10-CM | POA: Diagnosis not present

## 2018-11-22 DIAGNOSIS — Z8673 Personal history of transient ischemic attack (TIA), and cerebral infarction without residual deficits: Secondary | ICD-10-CM | POA: Diagnosis not present

## 2018-11-22 DIAGNOSIS — I480 Paroxysmal atrial fibrillation: Secondary | ICD-10-CM

## 2018-11-22 DIAGNOSIS — Z951 Presence of aortocoronary bypass graft: Secondary | ICD-10-CM | POA: Diagnosis not present

## 2018-11-22 NOTE — Progress Notes (Signed)
Primary Physician:  Shon Baton, MD   Patient ID: Linda Oliver, female    DOB: 09/03/1932, 85 y.o.   MRN: [id number]  Subjective:    Chief Complaint  Patient presents with  . Cerebrovascular Accident   This visit type was conducted due to national recommendations for restrictions regarding the COVID-19 Pandemic (e.g. social distancing).  This format is felt to be most appropriate for this patient at this time.  All issues noted in this document were discussed and addressed.  No physical exam was performed (except for noted visual exam findings with Telehealth visits).  The patient has consented to conduct a Telehealth visit and understands insurance will be billed.   I discussed the limitations of evaluation and management by telemedicine and the availability of in person appointments. The patient expressed understanding and agreed to proceed.  Virtual Visit via Video Note is as below  I connected with Linda Oliver, on 11/22/18 at 1006 by a video enabled telemedicine application and verified that I am speaking with the correct person using two identifiers.     I have discussed with her regarding the safety during COVID Pandemic and steps and precautions including social distancing with the patient.    HPI: Linda Oliver  is a 85 y.o. female  with CAD s/p CABG x 3 in 1996 in [place], HTN, hyperlipidemia, diabetes with PVD and sleep apnea on CPAP but not compliant, carotid stenosis, and history of  autonomic orthostatic hypotension, recently admitted with medial left occipital infarct in embolic pattern by MRI after she presents from opthamology office with partial right eye vision loss. She has since underwent loop recorder implantation with Dr. Curt Bears and was found to have atrial fibrillation, in which she is now on Eliquis. Plavix and ASA have been discontinued.   CTA head/neck showed left subclavian high-grade stenosis with irregular plaque/mural thrombus aortic isthmus and left subclavian  origin, left PCA branch occlusion and left ICA 50% stenosis and cervical bulb.  This is a 6 month follow up. Patient requested virtual visit. She had some residual right eye vision loss, but this has resolved. She feels that she is doing well. Does report some occasional palpitations and heart racing. No syncope. She does have dizziness with sudden position changes that has been stable and is chronic for her. Denies any arm fatigue or pain with activities. No chest pain or shortness of breath. Admits to not being compliant with leg stockings as she has mostly been at home, but is able to control leg edema with leg elevation.   Tolerating medications. No bleeding diathesis reported.   Past Medical History:  Diagnosis Date  . Anginal pain ([place])    occ  . Anxiety   . Broken ankle   . Bullous pemphigus   . CAD (coronary artery disease)    [place]   80% stenosis ostial Left main, 30% stenosis proximal LAD, 80% stenosis proximal RCA CABG  -  LIMA to LAD, SVG to int, SVG to [place], [place]   . Depression   . Diabetic peripheral neuropathy ([place])   . GERD (gastroesophageal reflux disease)   . Headache(784.0)    hx migraines  . Heart murmur    mvp  . Hyperlipidemia   . Hypertension   . Hypertensive heart disease   . Nasal bleeding    occ due to O2  . OA (osteoarthritis)   . Obesity (BMI 30-39.9)   . Obstructive sleep apnea on CPAP   .  Osteopenia   . Peripheral neuropathy   . Seizure ([place])    2009  . Sleep apnea    dx 73yrs ago doesnt use cpap but uses O2 at nite 2L  . Stones in the urinary tract   . Type 2 diabetes mellitus with vascular disease ([place])   . UTI (lower urinary tract infection)     Past Surgical History:  Procedure Laterality Date  . ABDOMINAL HYSTERECTOMY  70's  . CARDIAC CATHETERIZATION    . CARPAL TUNNEL RELEASE Bilateral   . CHOLECYSTECTOMY    . CORONARY ARTERY BYPASS GRAFT  96  . LOOP RECORDER INSERTION N/A 08/24/2018   Procedure: LOOP  RECORDER INSERTION;  Surgeon: Constance Haw, MD;  Location: [facility];  Service: Cardiovascular;  Laterality: N/A;  . SHOULDER ARTHROSCOPY     rt  . SHOULDER ARTHROSCOPY WITH ROTATOR CUFF REPAIR AND SUBACROMIAL DECOMPRESSION  12/01/2011   Procedure: SHOULDER ARTHROSCOPY WITH ROTATOR CUFF REPAIR AND SUBACROMIAL DECOMPRESSION;  Surgeon: Nita Sells, MD;  Location: [facility];  Service: Orthopedics;  Laterality: Left;  shoulder debridement calcific tendonitis    Social History   Socioeconomic History  . Marital status: Widowed    Spouse name: Not on file  . Number of children: 1  . Years of education: 34  . Highest education level: Not on file  Occupational History  . Not on file  Social Needs  . Financial resource strain: Not on file  . Food insecurity    Worry: Not on file    Inability: Not on file  . Transportation needs    Medical: Not on file    Non-medical: Not on file  Tobacco Use  . Smoking status: Former Smoker    Packs/day: 1.00    Years: 30.00    Pack years: 30.00    Types: Cigarettes    Quit date: 11/25/1986    Years since quitting: 32.0  . Smokeless tobacco: Never Used  Substance and Sexual Activity  . Alcohol use: No  . Drug use: No  . Sexual activity: Yes    Birth control/protection: Post-menopausal  Lifestyle  . Physical activity    Days per week: Not on file    Minutes per session: Not on file  . Stress: Not on file  Relationships  . Social Herbalist on phone: Not on file    Gets together: Not on file    Attends religious service: Not on file    Active member of club or organization: Not on file    Attends meetings of clubs or organizations: Not on file    Relationship status: Not on file  . Intimate partner violence    Fear of current or ex partner: Not on file    Emotionally abused: Not on file    Physically abused: Not on file    Forced sexual activity: Not on file  Other Topics Concern  . Not on file   Social History Narrative   Married, has 1 child   Caffeine use: none    Review of Systems  Constitution: Negative for decreased appetite, malaise/fatigue, weight gain and weight loss.  Eyes: Negative for visual disturbance.  Cardiovascular: Positive for dyspnea on exertion, irregular heartbeat and leg swelling (right ankle chronic). Negative for chest pain, claudication and syncope.  Respiratory: Positive for cough (occasional) and snoring (on CPAP). Negative for hemoptysis and wheezing.   Endocrine: Negative for cold intolerance and heat intolerance.  Hematologic/Lymphatic: Does not bruise/bleed easily.  Skin: Negative for nail changes.  Musculoskeletal: Negative for myalgias.  Gastrointestinal: Negative for abdominal pain, nausea and vomiting.  Genitourinary: Positive for bladder incontinence (chronic).  Neurological: Positive for dizziness. Negative for difficulty with concentration, focal weakness and headaches.  Psychiatric/Behavioral: Negative for altered mental status and suicidal ideas.  All other systems reviewed and are negative.     Objective:  Blood pressure (!) 164/79, pulse 70, height 5' (1.524 m), weight 167 lb 3.2 oz (75.8 kg). Body mass index is 32.65 kg/m.    Physical exam not performed or limited due to virtual visit.   Please see exam details from prior visit is as below.   Physical Exam  Constitutional: She is oriented to person, place, and time. Vital signs are normal. She appears well-developed and well-nourished.  HENT:  Head: Normocephalic and atraumatic.  Neck: Normal range of motion. Carotid bruit is present.  Cardiovascular: Normal rate, regular rhythm, normal heart sounds and intact distal pulses.  Pulmonary/Chest: Effort normal and breath sounds normal. No accessory muscle usage. No respiratory distress.  Abdominal: Soft. Bowel sounds are normal.  Musculoskeletal: Normal range of motion.  Neurological: She is alert and oriented to person, place,  and time.  Skin: Skin is warm and dry.  Vitals reviewed.  Radiology: No results found.  Laboratory examination:    CMP Latest Ref Rng & Units 10/04/2018 08/23/2018 08/22/2018  Glucose 70 - 99 mg/dL 336(H) 150(H) -  BUN 8 - 23 mg/dL 17 15 -  Creatinine 0.44 - 1.00 mg/dL 1.27(H) 0.90 1.04(H)  Sodium 135 - 145 mmol/L 137 142 -  Potassium 3.5 - 5.1 mmol/L 4.2 3.7 -  Chloride 98 - 111 mmol/L 101 105 -  CO2 22 - 32 mmol/L 24 28 -  Calcium 8.9 - 10.3 mg/dL 8.7(L) 8.5(L) -  Total Protein 6.5 - 8.1 g/dL - 6.0(L) -  Total Bilirubin 0.3 - 1.2 mg/dL - 0.4 -  Alkaline Phos 38 - 126 U/L - 100 -  AST 15 - 41 U/L - 15 -  ALT 0 - 44 U/L - 12 -   CBC Latest Ref Rng & Units 10/04/2018 08/23/2018 08/22/2018  WBC 4.0 - 10.5 K/uL 9.3 8.6 9.6  Hemoglobin 12.0 - 15.0 g/dL 11.2(L) 11.1(L) 11.7(L)  Hematocrit 36.0 - 46.0 % 37.5 34.7(L) 36.7  Platelets 150 - 400 K/uL 279 240 258   Lipid Panel     Component Value Date/Time   CHOL 127 08/23/2018 0434   TRIG 358 (H) 08/23/2018 0434   HDL 27 (L) 08/23/2018 0434   CHOLHDL 4.7 08/23/2018 0434   VLDL 72 (H) 08/23/2018 0434   LDLCALC 28 08/23/2018 0434   HEMOGLOBIN A1C Lab Results  Component Value Date   HGBA1C 8.7 (H) 08/23/2018   MPG 202.99 08/23/2018   TSH No results for input(s): TSH in the last 8760 hours.  PRN Meds:. There are no discontinued medications. Current Meds  Medication Sig  . acetaminophen (TYLENOL) 500 MG tablet Take 500 mg by mouth every 4 (four) hours as needed for moderate pain.  Marland Kitchen amoxicillin (AMOXIL) 500 MG capsule Take 2,000 mg by mouth See admin instructions. Take 2,000 mg by mouth one hour prior to dental appointments or procedures- as directed  . apixaban (ELIQUIS) 5 MG TABS tablet Take 1 tablet (5 mg total) by mouth 2 (two) times daily.  . busPIRone (BUSPAR) 7.5 MG tablet Take 7.5 mg by mouth 2 (two) times daily.  . cyanocobalamin 1000 MCG tablet Take 1,000 mcg by mouth daily.  Marland Kitchen  DULoxetine (CYMBALTA) 60 MG capsule Take  60 mg by mouth daily.  Marland Kitchen ezetimibe (ZETIA) 10 MG tablet Take 1 tablet (10 mg total) by mouth daily.  . fesoterodine (TOVIAZ) 8 MG TB24 tablet Take 8 mg by mouth every evening.   . fluticasone (FLONASE) 50 MCG/ACT nasal spray Place 1 spray into both nostrils 2 (two) times daily as needed for allergies or rhinitis.  Marland Kitchen gabapentin (NEURONTIN) 300 MG capsule Take 300 mg by mouth 2 (two) times daily.  Marland Kitchen glimepiride (AMARYL) 1 MG tablet Take 1 mg by mouth daily.  . hydrOXYzine (ATARAX/VISTARIL) 25 MG tablet Take 50 mg by mouth every evening.   . Insulin Glargine (LANTUS SOLOSTAR) 100 UNIT/ML Solostar Pen Inject 40 Units into the skin daily before breakfast.   . lactobacillus acidophilus (BACID) TABS tablet Take 1 tablet by mouth daily.  . metoprolol succinate (TOPROL-XL) 25 MG 24 hr tablet Take 12.5 mg by mouth 2 (two) times daily.  Marland Kitchen NAMZARIC 28-10 MG CP24 TAKE 1 CAPSULE DAILY  . pantoprazole (PROTONIX) 40 MG tablet Take 40 mg by mouth every evening.   Marland Kitchen Phenylephrine HCl (SINEX REGULAR NA) Place 1 spray into both nostrils as needed (for congestion).  . rosuvastatin (CRESTOR) 10 MG tablet Take 10 mg by mouth daily before supper.  . sitaGLIPtin (JANUVIA) 100 MG tablet Take 100 mg by mouth daily.  . valsartan-hydrochlorothiazide (DIOVAN-HCT) 160-25 MG tablet Take 0.5 tablets by mouth daily.    Cardiac Studies:   Carotid artery duplex  10/25/2018: Stenosis in the right internal carotid artery (50-69%). Stenosis in the right external carotid artery (<50%). Stenosis in the left internal carotid artery (16-49%). Stenosis in the left external carotid artery (<50%) Antegrade right vertebral artery flow. Antegrade left vertebral artery flow. Compared to the study done on 11/15/2017, no significant change. Follow up in six months is appropriate if clinically indicated.  Echocardiogram 08/22/2017:  1. The left ventricle has normal systolic function with an ejection fraction of 60-65%. The cavity size was  normal. Left ventricular diastolic Doppler parameters are consistent with impaired relaxation. No evidence of left ventricular regional wall  motion abnormalities.  2. The right ventricle has normal systolic function. The cavity was normal. There is no increase in right ventricular wall thickness.  3. Left atrial size was mildly dilated.  4. There is mild mitral annular calcification present. No evidence of mitral valve stenosis. Trivial mitral regurgitation.  5. The aortic valve is tricuspid. Mild calcification of the aortic valve. Aortic valve regurgitation is mild to moderate by color flow Doppler. No stenosis of the aortic valve.  6. The aorta is normal unless otherwise noted.  7. Normal IVC size. PA systolic pressure 39 mmHg.  Lexiscan myoview stress test 10/24/2016: 1. Resting EKG shows T inversion in high lateral leads. There was <1 mm ST depression and T inversion in inferior and lateral leads, reverted to baseline immediately into recovery. Equivocal ischemic change due to pharmacologic stress with lexiscan. Symptoms included dyspnea and dizziness.  2. Very subtle basal inferior small sized reversible defect suggestive of ischemia noted. Normal LVEF without wall motion abnormality at 72%. Low risk study.  Cath 1996: [place]   80% stenosis ostial Left main, 30% stenosis proximal LAD, 80% stenosis proximal RCA CABG  -  LIMA to LAD, SVG to int, SVG to [place], [place]   Assessment:     ICD-10-CM   1. Paroxysmal atrial fibrillation (HCC)  I48.0   2. Stenosis of left subclavian artery (HCC)  I77.1   3. Carotid stenosis, bilateral  I65.23   4. Coronary artery disease involving native coronary artery of native heart without angina pectoris  I25.10   5. History of cardioembolic cerebrovascular accident (CVA)  Z86.73     EKG 06/12/2017: Normal sinus rhythm at rate of 68 bpm, normal axis. High lateral ST segment depression with T-wave inversion, cannot exclude ischemia.  Incomplete RBBB. No significant change from 10/14/2016.   CHA2DS2-VASCScore: Risk Score 8,  Yearly risk of stroke 6.7%. Recommendation: Anticoagulation    Recommendations:   Fortunately patient has had full recovery from her CVA without any residual deficits.  Feel that with the likely culprit of her left occipital CVA related to A. fib.  She does have high-grade left subclavian artery stenosis, likely overestimated by CT scan, as her carotid duplex continues to show antegrade flow.  She is without any symptoms of claudication or exertional dizziness.  We will continue with monitoring and medical management unless she were to become symptomatic.  Carotid stenosis has been stable, she will need repeat carotid duplex in 6 months for continued surveillance.  She does have orthostatic hypotension that has been overall stable.  In fact her standing blood pressure is actually elevated.  I have encouraged her to take valsartan hydrochlorothiazide 1 full tablet and to notify me if she has any worsening dizziness or drops in her blood pressure. Lipids appear to be well controlled. We have discussed importance of better controlling her diabetes. She will discuss with Dr. Virgina Jock. Encouraged diet changes to also help.  She is on anticoagulation for paroxysmal A. fib.  She does have occasional episodes of palpitations.  Currently on metoprolol.  We will have her loop recorder transmissions transferred to [place] for continued care.  Depending upon findings on her loop recorder, may make a few adjustments to her medications.  Overall, patient is doing well.  Her daughter was present for telephone appointment today.  All questions were answered.  They are agreeable to coming into the office in 2 months for follow-up given her complex medical issues.    *I have discussed this case with Dr. Einar Gip and he  participated in formulating the plan.*    Miquel Dunn, MSN, APRN, FNP-C [facility]. [place]  Office: [phone] Fax: [phone]

## 2018-11-26 ENCOUNTER — Ambulatory Visit: Payer: Medicare Other | Admitting: Neurology

## 2018-11-28 ENCOUNTER — Telehealth: Payer: Self-pay | Admitting: Adult Health

## 2018-11-28 ENCOUNTER — Telehealth: Payer: Medicare Other | Admitting: Adult Health

## 2018-11-28 ENCOUNTER — Encounter: Payer: Self-pay | Admitting: Adult Health

## 2018-11-28 ENCOUNTER — Telehealth: Payer: Self-pay

## 2018-11-28 NOTE — Telephone Encounter (Signed)
Patient was a no show for their virtual visit today.  

## 2018-11-28 NOTE — Telephone Encounter (Signed)
FYI-Daughter Shelia on DPR called stating that the Pt does not feel comfortable coming in to the office due to the COVID spike.

## 2018-11-28 NOTE — Telephone Encounter (Signed)
Noted  

## 2018-11-29 NOTE — Telephone Encounter (Signed)
Received my chart message from patient stating she and daughter waited a long time to connect for VV yesterday. The visit never connected. I spoke with daughter, Freda Munro and I apologized and advised her that it may have timed out. We rescheduled my chart VV. Daughter verbalized understanding, appreciation.

## 2018-12-02 LAB — CUP PACEART REMOTE DEVICE CHECK
Date Time Interrogation Session: 20201122074551
Implantable Pulse Generator Implant Date: 20200814

## 2018-12-03 ENCOUNTER — Ambulatory Visit (INDEPENDENT_AMBULATORY_CARE_PROVIDER_SITE_OTHER): Payer: Medicare Other | Admitting: *Deleted

## 2018-12-03 DIAGNOSIS — I639 Cerebral infarction, unspecified: Secondary | ICD-10-CM

## 2018-12-04 DIAGNOSIS — I119 Hypertensive heart disease without heart failure: Secondary | ICD-10-CM | POA: Diagnosis not present

## 2018-12-04 DIAGNOSIS — I771 Stricture of artery: Secondary | ICD-10-CM | POA: Diagnosis not present

## 2018-12-04 DIAGNOSIS — N1831 Chronic kidney disease, stage 3a: Secondary | ICD-10-CM | POA: Diagnosis not present

## 2018-12-04 DIAGNOSIS — E669 Obesity, unspecified: Secondary | ICD-10-CM | POA: Diagnosis not present

## 2018-12-04 DIAGNOSIS — E1149 Type 2 diabetes mellitus with other diabetic neurological complication: Secondary | ICD-10-CM | POA: Diagnosis not present

## 2018-12-04 DIAGNOSIS — I4891 Unspecified atrial fibrillation: Secondary | ICD-10-CM | POA: Diagnosis not present

## 2018-12-04 DIAGNOSIS — R627 Adult failure to thrive: Secondary | ICD-10-CM | POA: Diagnosis not present

## 2018-12-04 DIAGNOSIS — N3281 Overactive bladder: Secondary | ICD-10-CM | POA: Diagnosis not present

## 2018-12-04 DIAGNOSIS — I6990 Unspecified sequelae of unspecified cerebrovascular disease: Secondary | ICD-10-CM | POA: Diagnosis not present

## 2018-12-04 DIAGNOSIS — I6529 Occlusion and stenosis of unspecified carotid artery: Secondary | ICD-10-CM | POA: Diagnosis not present

## 2018-12-04 DIAGNOSIS — R413 Other amnesia: Secondary | ICD-10-CM | POA: Diagnosis not present

## 2018-12-04 DIAGNOSIS — G2581 Restless legs syndrome: Secondary | ICD-10-CM | POA: Diagnosis not present

## 2018-12-19 DIAGNOSIS — H53461 Homonymous bilateral field defects, right side: Secondary | ICD-10-CM | POA: Diagnosis not present

## 2018-12-19 DIAGNOSIS — E119 Type 2 diabetes mellitus without complications: Secondary | ICD-10-CM | POA: Diagnosis not present

## 2018-12-19 DIAGNOSIS — H35371 Puckering of macula, right eye: Secondary | ICD-10-CM | POA: Diagnosis not present

## 2018-12-19 DIAGNOSIS — H35071 Retinal telangiectasis, right eye: Secondary | ICD-10-CM | POA: Diagnosis not present

## 2018-12-20 ENCOUNTER — Ambulatory Visit
Admission: RE | Admit: 2018-12-20 | Discharge: 2018-12-20 | Disposition: A | Discharge status: Home / Self Care | Payer: Medicare Other | Source: Ambulatory Visit | Attending: Internal Medicine | Admitting: Internal Medicine

## 2018-12-20 DIAGNOSIS — E041 Nontoxic single thyroid nodule: Secondary | ICD-10-CM

## 2019-01-01 ENCOUNTER — Telehealth (INDEPENDENT_AMBULATORY_CARE_PROVIDER_SITE_OTHER): Payer: Medicare Other | Admitting: Adult Health

## 2019-01-01 DIAGNOSIS — Z9989 Dependence on other enabling machines and devices: Secondary | ICD-10-CM

## 2019-01-01 DIAGNOSIS — I6523 Occlusion and stenosis of bilateral carotid arteries: Secondary | ICD-10-CM | POA: Diagnosis not present

## 2019-01-01 DIAGNOSIS — G4733 Obstructive sleep apnea (adult) (pediatric): Secondary | ICD-10-CM | POA: Diagnosis not present

## 2019-01-01 NOTE — Progress Notes (Signed)
PATIENT: Linda Oliver DOB: 28-Mar-1932  REASON FOR VISIT: follow up HISTORY FROM: patient  Virtual Visit via Video Note  I connected with Linda Oliver on 01/01/19 at  1:30 PM EST by a video enabled telemedicine application located remotely at [facility] and verified that I am speaking with the correct person using two identifiers who was located at their own home.   I discussed the limitations of evaluation and management by telemedicine and the availability of in person appointments. The patient expressed understanding and agreed to proceed.   PATIENT: Linda Oliver DOB: Aug 12, 1932  REASON FOR VISIT: follow up HISTORY FROM: patient  HISTORY OF PRESENT ILLNESS: Today 01/01/19:  Linda Oliver is an 85 year old female with a history of obstructive sleep apnea on CPAP.  Her download indicates that she used her machine 12 out of 30 days for compliance of 40%.  She used her machine greater than 4 hours each night.  She states that she has been using her machine nightly.  Her download shows that from November 21 to December 8 no data.  We will call her DME company to inquire about this.  On average she uses her machine 8 hours and 26 minutes.  Her residual AHI is 0.7 on 6 to 13 cm of water.  She states that her DME company never sent her new supplies so she got her own mask online.  She states that this mask is working better for her.  She would like more supplies from her DME company.   REVIEW OF SYSTEMS: Out of a complete 14 system review of symptoms, the patient complains only of the following symptoms, and all other reviewed systems are negative.  See HPI  ALLERGIES: Allergies  Allergen Reactions  . Percocet [Oxycodone-Acetaminophen] Nausea And Vomiting  . Latex Itching and Rash    EKG leads    HOME MEDICATIONS: Outpatient Medications Prior to Visit  Medication Sig Dispense Refill  . acetaminophen (TYLENOL) 500 MG tablet Take 500 mg by mouth every 4 (four)  hours as needed for moderate pain.    Marland Kitchen amoxicillin (AMOXIL) 500 MG capsule Take 2,000 mg by mouth See admin instructions. Take 2,000 mg by mouth one hour prior to dental appointments or procedures- as directed    . apixaban (ELIQUIS) 5 MG TABS tablet Take 1 tablet (5 mg total) by mouth 2 (two) times daily. 180 tablet 3  . busPIRone (BUSPAR) 7.5 MG tablet Take 7.5 mg by mouth 2 (two) times daily.    . cyanocobalamin 1000 MCG tablet Take 1,000 mcg by mouth daily.    . DULoxetine (CYMBALTA) 60 MG capsule Take 60 mg by mouth daily.    Marland Kitchen ezetimibe (ZETIA) 10 MG tablet Take 1 tablet (10 mg total) by mouth daily. 30 tablet 0  . fesoterodine (TOVIAZ) 8 MG TB24 tablet Take 8 mg by mouth every evening.     . fluticasone (FLONASE) 50 MCG/ACT nasal spray Place 1 spray into both nostrils 2 (two) times daily as needed for allergies or rhinitis.    Marland Kitchen gabapentin (NEURONTIN) 300 MG capsule Take 300 mg by mouth 2 (two) times daily.    Marland Kitchen glimepiride (AMARYL) 1 MG tablet Take 1 mg by mouth daily.    . hydrOXYzine (ATARAX/VISTARIL) 25 MG tablet Take 50 mg by mouth every evening.     . Insulin Glargine (LANTUS SOLOSTAR) 100 UNIT/ML Solostar Pen Inject 40 Units into the skin daily before breakfast.     .  lactobacillus acidophilus (BACID) TABS tablet Take 1 tablet by mouth daily.    . metoprolol succinate (TOPROL-XL) 25 MG 24 hr tablet Take 12.5 mg by mouth 2 (two) times daily.    Marland Kitchen NAMZARIC 28-10 MG CP24 TAKE 1 CAPSULE DAILY 90 capsule 0  . pantoprazole (PROTONIX) 40 MG tablet Take 40 mg by mouth every evening.     Marland Kitchen Phenylephrine HCl (SINEX REGULAR NA) Place 1 spray into both nostrils as needed (for congestion).    . rosuvastatin (CRESTOR) 10 MG tablet Take 10 mg by mouth daily before supper.    . sitaGLIPtin (JANUVIA) 100 MG tablet Take 100 mg by mouth daily.    . valsartan-hydrochlorothiazide (DIOVAN-HCT) 160-25 MG tablet Take 0.5 tablets by mouth daily. 90 tablet 1   No facility-administered medications prior to  visit.    PAST MEDICAL HISTORY: Past Medical History:  Diagnosis Date  . Anginal pain ([place])    occ  . Anxiety   . Broken ankle   . Bullous pemphigus   . CAD (coronary artery disease)    [place]   80% stenosis ostial Left main, 30% stenosis proximal LAD, 80% stenosis proximal RCA CABG  -  LIMA to LAD, SVG to int, SVG to [place], [place]   . Depression   . Diabetic peripheral neuropathy ([place])   . GERD (gastroesophageal reflux disease)   . Headache(784.0)    hx migraines  . Heart murmur    mvp  . Hyperlipidemia   . Hypertension   . Hypertensive heart disease   . Nasal bleeding    occ due to O2  . OA (osteoarthritis)   . Obesity (BMI 30-39.9)   . Obstructive sleep apnea on CPAP   . Osteopenia   . Peripheral neuropathy   . Seizure ([place])    2009  . Sleep apnea    dx 64yrs ago doesnt use cpap but uses O2 at nite 2L  . Stones in the urinary tract   . Type 2 diabetes mellitus with vascular disease ([place])   . UTI (lower urinary tract infection)     PAST SURGICAL HISTORY: Past Surgical History:  Procedure Laterality Date  . ABDOMINAL HYSTERECTOMY  70's  . CARDIAC CATHETERIZATION    . CARPAL TUNNEL RELEASE Bilateral   . CHOLECYSTECTOMY    . CORONARY ARTERY BYPASS GRAFT  96  . LOOP RECORDER INSERTION N/A 08/24/2018   Procedure: LOOP RECORDER INSERTION;  Surgeon: Constance Haw, MD;  Location: [facility];  Service: Cardiovascular;  Laterality: N/A;  . SHOULDER ARTHROSCOPY     rt  . SHOULDER ARTHROSCOPY WITH ROTATOR CUFF REPAIR AND SUBACROMIAL DECOMPRESSION  12/01/2011   Procedure: SHOULDER ARTHROSCOPY WITH ROTATOR CUFF REPAIR AND SUBACROMIAL DECOMPRESSION;  Surgeon: Nita Sells, MD;  Location: [facility];  Service: Orthopedics;  Laterality: Left;  shoulder debridement calcific tendonitis    FAMILY HISTORY: Family History  Problem Relation Age of Onset  . Coronary artery disease Mother   . Heart disease Father   . Cancer Father         skin  . Cancer Brother   . Cancer - Colon Brother        brain, Lymphoma    SOCIAL HISTORY: Social History   Socioeconomic History  . Marital status: Widowed    Spouse name: Not on file  . Number of children: 1  . Years of education: 60  . Highest education level: Not on file  Occupational History  .  Not on file  Tobacco Use  . Smoking status: Former Smoker    Packs/day: 1.00    Years: 30.00    Pack years: 30.00    Types: Cigarettes    Quit date: 11/25/1986    Years since quitting: 32.1  . Smokeless tobacco: Never Used  Substance and Sexual Activity  . Alcohol use: No  . Drug use: No  . Sexual activity: Yes    Birth control/protection: Post-menopausal  Other Topics Concern  . Not on file  Social History Narrative   Married, has 1 child   Caffeine use: none   Social Determinants of Radio broadcast assistant Strain:   . Difficulty of Paying Living Expenses: Not on file  Food Insecurity:   . Worried About Charity fundraiser in the Last Year: Not on file  . Ran Out of Food in the Last Year: Not on file  Transportation Needs:   . Lack of Transportation (Medical): Not on file  . Lack of Transportation (Non-Medical): Not on file  Physical Activity:   . Days of Exercise per Week: Not on file  . Minutes of Exercise per Session: Not on file  Stress:   . Feeling of Stress : Not on file  Social Connections:   . Frequency of Communication with Friends and Family: Not on file  . Frequency of Social Gatherings with Friends and Family: Not on file  . Attends Religious Services: Not on file  . Active Member of Clubs or Organizations: Not on file  . Attends Archivist Meetings: Not on file  . Marital Status: Not on file  Intimate Partner Violence:   . Fear of Current or Ex-Partner: Not on file  . Emotionally Abused: Not on file  . Physically Abused: Not on file  . Sexually Abused: Not on file      PHYSICAL EXAM Generalized: Well developed, in no  acute distress   Neurological examination  Mentation: Alert oriented to time, place, history taking. Follows all commands speech and language fluent Cranial nerve II-XII:Extraocular movements were full. Facial symmetry noted. Marland Kitchen Head turning and shoulder shrug  were normal and symmetric. Motor: Good strength throughout subjectively per patient Sensory: Sensory testing is intact to soft touch on all 4 extremities subjectively per patient Coordination: Cerebellar testing reveals good finger-nose-finger  Reflexes: UTA  DIAGNOSTIC DATA (LABS, IMAGING, TESTING) - I reviewed patient records, labs, notes, testing and imaging myself where available.  Lab Results  Component Value Date   WBC 9.3 10/04/2018   HGB 11.2 (L) 10/04/2018   HCT 37.5 10/04/2018   MCV 87.0 10/04/2018   PLT 279 10/04/2018      Component Value Date/Time   NA 137 10/04/2018 1046   NA 140 09/21/2016 0818   K 4.2 10/04/2018 1046   CL 101 10/04/2018 1046   CO2 24 10/04/2018 1046   GLUCOSE 336 (H) 10/04/2018 1046   BUN 17 10/04/2018 1046   BUN 12 09/21/2016 0818   CREATININE 1.27 (H) 10/04/2018 1046   CALCIUM 8.7 (L) 10/04/2018 1046   PROT 6.0 (L) 08/23/2018 0434   PROT 6.8 09/21/2016 0818   ALBUMIN 2.9 (L) 08/23/2018 0434   ALBUMIN 3.9 09/21/2016 0818   AST 15 08/23/2018 0434   ALT 12 08/23/2018 0434   ALKPHOS 100 08/23/2018 0434   BILITOT 0.4 08/23/2018 0434   BILITOT 0.4 09/21/2016 0818   GFRNONAA 39 (L) 10/04/2018 1046   GFRAA 45 (L) 10/04/2018 1046   Lab Results  Component  Value Date   CHOL 127 08/23/2018   HDL 27 (L) 08/23/2018   LDLCALC 28 08/23/2018   TRIG 358 (H) 08/23/2018   CHOLHDL 4.7 08/23/2018   Lab Results  Component Value Date   HGBA1C 8.7 (H) 08/23/2018   Lab Results  Component Value Date   VITAMINB12 >2000 (H) 09/21/2016   Lab Results  Component Value Date   TSH 3.040 09/21/2016      ASSESSMENT AND PLAN 85 y.o. year old female  has a past medical history of Anginal pain  ([place]), Anxiety, Broken ankle, Bullous pemphigus, CAD (coronary artery disease), Depression, Diabetic peripheral neuropathy ([place]), GERD (gastroesophageal reflux disease), Headache(784.0), Heart murmur, Hyperlipidemia, Hypertension, Hypertensive heart disease, Nasal bleeding, OA (osteoarthritis), Obesity (BMI 30-39.9), Obstructive sleep apnea on CPAP, Osteopenia, Peripheral neuropathy, Seizure ([place]), Sleep apnea, Stones in the urinary tract, Type 2 diabetes mellitus with vascular disease ([place]), and UTI (lower urinary tract infection). here with:  1. Obstructive sleep apnea on CPAP  Patient CPAP download shows good compliance and good treatment of her apnea. There is data missing according to the patient- advise we would reach out to her DME company. Also sent order for DME company to give new supplies. She is encouraged to continue using CPAP nightly and greater than 4 hours each night.  She is advised that if her symptoms worsen or she develops new symptoms she should let us know.  She will follow-up in 1 year or sooner if needed.     I spent 15 minutes with the patient. 50% of this time was spent reviewing CPAP download.   Ward Givens, MSN, NP-C 01/01/2019, 4:23 PM [facility] [address] [place], [place] [address] [phone]

## 2019-01-02 ENCOUNTER — Ambulatory Visit (INDEPENDENT_AMBULATORY_CARE_PROVIDER_SITE_OTHER): Payer: Medicare Other | Admitting: *Deleted

## 2019-01-02 DIAGNOSIS — I639 Cerebral infarction, unspecified: Secondary | ICD-10-CM | POA: Diagnosis not present

## 2019-01-02 NOTE — Progress Notes (Addendum)
CM sent to [employer] for new cpap orders. 01-02-19 sy they did receive orders.

## 2019-01-03 LAB — CUP PACEART REMOTE DEVICE CHECK
Date Time Interrogation Session: 20201224053329
Implantable Pulse Generator Implant Date: 20200814

## 2019-02-03 LAB — CUP PACEART REMOTE DEVICE CHECK
Date Time Interrogation Session: 20210124230214
Implantable Pulse Generator Implant Date: 20200814

## 2019-02-04 ENCOUNTER — Ambulatory Visit (INDEPENDENT_AMBULATORY_CARE_PROVIDER_SITE_OTHER): Payer: Medicare Other | Admitting: *Deleted

## 2019-02-04 ENCOUNTER — Other Ambulatory Visit: Payer: Self-pay

## 2019-02-04 DIAGNOSIS — I639 Cerebral infarction, unspecified: Secondary | ICD-10-CM

## 2019-02-04 MED ORDER — VALSARTAN-HYDROCHLOROTHIAZIDE 160-25 MG PO TABS: 1.0000 | ORAL_TABLET | Freq: Every day | ORAL | 3 refills | Status: DC

## 2019-02-06 ENCOUNTER — Other Ambulatory Visit: Payer: Self-pay

## 2019-02-06 ENCOUNTER — Other Ambulatory Visit: Payer: Self-pay | Admitting: Neurology

## 2019-02-06 MED ORDER — VALSARTAN-HYDROCHLOROTHIAZIDE 160-25 MG PO TABS: 1.0000 | ORAL_TABLET | Freq: Every day | ORAL | 0 refills | Status: DC

## 2019-02-22 ENCOUNTER — Ambulatory Visit: Payer: Medicare Other | Admitting: Cardiology

## 2019-02-22 NOTE — Progress Notes (Deleted)
Primary Physician:  Shon Baton, MD   Patient ID: Linda Oliver, female    DOB: 1932-01-16, 86 y.o.   MRN: [id number]  Subjective:    No chief complaint on file.   HPI: Linda Oliver  is a 86 y.o. 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 She has since underwent loop recorder implantation with Dr. Curt Bears and was found to have atrial fibrillation, in which she is now on Eliquis. Plavix and ASA have been discontinued.   CTA head/neck showed left subclavian high-grade stenosis with irregular plaque/mural thrombus aortic isthmus and left subclavian origin, left PCA branch occlusion and left ICA 50% stenosis and cervical bulb.  She had some residual right eye vision loss, but this has resolved. She feels that she is doing well. Does report some occasional palpitations and heart racing. No syncope. She does have dizziness with sudden position changes that has been stable and is chronic for her. Denies any arm fatigue or pain with activities. No chest pain or shortness of breath. Admits to not being compliant with leg stockings as she has mostly been at home, but is able to control leg edema with leg elevation.   Tolerating medications. No bleeding diathesis reported.   Past Medical History:  Diagnosis Date  . Anginal pain ([place])    occ  . Anxiety   . Broken ankle   . Bullous pemphigus   . CAD (coronary artery disease)    [place]   80% stenosis ostial Left main, 30% stenosis proximal LAD, 80% stenosis proximal RCA CABG  -  LIMA to LAD, SVG to int, SVG to [place], [place]   . Depression   . Diabetic peripheral neuropathy ([place])   . GERD (gastroesophageal reflux  disease)   . Headache(784.0)    hx migraines  . Heart murmur    mvp  . Hyperlipidemia   . Hypertension   . Hypertensive heart disease   . Nasal bleeding    occ due to O2  . OA (osteoarthritis)   . Obesity (BMI 30-39.9)   . Obstructive sleep apnea on CPAP   . Osteopenia   . Peripheral neuropathy   . Seizure ([place])    2009  . Sleep apnea    dx 35yrs ago doesnt use cpap but uses O2 at nite 2L  . Stones in the urinary tract   . Type 2 diabetes mellitus with vascular disease ([place])   . UTI (lower urinary tract infection)     Past Surgical History:  Procedure Laterality Date  . ABDOMINAL HYSTERECTOMY  70's  . CARDIAC CATHETERIZATION    . CARPAL TUNNEL RELEASE Bilateral   . CHOLECYSTECTOMY    . CORONARY ARTERY BYPASS GRAFT  96  . LOOP RECORDER INSERTION N/A 08/24/2018   Procedure: LOOP RECORDER INSERTION;  Surgeon: Constance Haw, MD;  Location: [facility];  Service: Cardiovascular;  Laterality: N/A;  . SHOULDER ARTHROSCOPY     rt  . SHOULDER ARTHROSCOPY WITH ROTATOR CUFF REPAIR AND SUBACROMIAL DECOMPRESSION  12/01/2011   Procedure: SHOULDER ARTHROSCOPY WITH ROTATOR CUFF REPAIR AND SUBACROMIAL DECOMPRESSION;  Surgeon: Nita Sells, MD;  Location: [facility];  Service: Orthopedics;  Laterality: Left;  shoulder debridement calcific tendonitis    Social History   Socioeconomic History  . Marital status: Widowed    Spouse name: Not on file  . Number of children: 1  . Years of education: 21  . Highest education level: Not on file  Occupational History  . Not on file  Tobacco Use  . Smoking status: Former Smoker    Packs/day: 1.00    Years: 30.00    Pack years: 30.00    Types: Cigarettes    Quit date: 11/25/1986    Years since quitting: 32.2  . Smokeless tobacco: Never Used  Substance and Sexual Activity  . Alcohol use: No  . Drug use: No  . Sexual activity: Yes    Birth control/protection: Post-menopausal  Other Topics Concern  . Not on file   Social History Narrative   Married, has 1 child   Caffeine use: none   Social Determinants of Radio broadcast assistant Strain:   . Difficulty of Paying Living Expenses: Not on file  Food Insecurity:   . Worried About Charity fundraiser in the Last Year: Not on file  . Ran Out of Food in the Last Year: Not on file  Transportation Needs:   . Lack of Transportation (Medical): Not on file  . Lack of Transportation (Non-Medical): Not on file  Physical Activity:   . Days of Exercise per Week: Not on file  . Minutes of Exercise per Session: Not on file  Stress:   . Feeling of Stress : Not on file  Social Connections:   . Frequency of Communication with Friends and Family: Not on file  . Frequency of Social Gatherings with Friends and Family: Not on file  . Attends Religious Services: Not on file  . Active Member of Clubs or Organizations: Not on file  . Attends Archivist Meetings: Not on file  . Marital Status: Not on file  Intimate Partner Violence:   . Fear of Current or Ex-Partner: Not on file  . Emotionally Abused: Not on file  . Physically Abused: Not on file  . Sexually Abused: Not on file    Review of Systems  Constitution: Negative for decreased appetite, malaise/fatigue, weight gain and weight loss.  Eyes: Negative for visual disturbance.  Cardiovascular: Positive for dyspnea on exertion, irregular heartbeat and leg swelling (right ankle chronic). Negative for chest pain, claudication and syncope.  Respiratory: Positive for cough (occasional) and snoring (on CPAP). Negative for hemoptysis and wheezing.   Endocrine: Negative for cold intolerance and heat intolerance.  Hematologic/Lymphatic: Does not bruise/bleed easily.  Skin: Negative for nail changes.  Musculoskeletal: Negative for myalgias.  Gastrointestinal: Negative for abdominal pain, nausea and vomiting.  Genitourinary: Positive for bladder incontinence (chronic).  Neurological: Positive for  dizziness. Negative for difficulty with concentration, focal weakness and headaches.  Psychiatric/Behavioral: Negative for altered mental status and suicidal ideas.  All other systems reviewed and are negative.     Objective:  There were no vitals taken for this visit. There is no height or weight on file to calculate BMI.     Physical Exam  Constitutional: She is oriented to person, place, and time. Vital signs are normal. She appears well-developed and well-nourished.  HENT:  Head: Normocephalic and atraumatic.  Neck: Carotid bruit is present.  Cardiovascular: Normal rate, regular rhythm, normal heart sounds and intact distal pulses.  Pulmonary/Chest: Effort normal and breath sounds normal. No accessory muscle usage. No respiratory distress.  Abdominal:  Soft. Bowel sounds are normal.  Musculoskeletal:        General: Normal range of motion.     Cervical back: Normal range of motion.  Neurological: She is alert and oriented to person, place, and time.  Skin: Skin is warm and dry.  Vitals reviewed.  Radiology: No results found.  Laboratory examination:    CMP Latest Ref Rng & Units 10/04/2018 08/23/2018 08/22/2018  Glucose 70 - 99 mg/dL 336(H) 150(H) -  BUN 8 - 23 mg/dL 17 15 -  Creatinine 0.44 - 1.00 mg/dL 1.27(H) 0.90 1.04(H)  Sodium 135 - 145 mmol/L 137 142 -  Potassium 3.5 - 5.1 mmol/L 4.2 3.7 -  Chloride 98 - 111 mmol/L 101 105 -  CO2 22 - 32 mmol/L 24 28 -  Calcium 8.9 - 10.3 mg/dL 8.7(L) 8.5(L) -  Total Protein 6.5 - 8.1 g/dL - 6.0(L) -  Total Bilirubin 0.3 - 1.2 mg/dL - 0.4 -  Alkaline Phos 38 - 126 U/L - 100 -  AST 15 - 41 U/L - 15 -  ALT 0 - 44 U/L - 12 -   CBC Latest Ref Rng & Units 10/04/2018 08/23/2018 08/22/2018  WBC 4.0 - 10.5 K/uL 9.3 8.6 9.6  Hemoglobin 12.0 - 15.0 g/dL 11.2(L) 11.1(L) 11.7(L)  Hematocrit 36.0 - 46.0 % 37.5 34.7(L) 36.7  Platelets 150 - 400 K/uL 279 240 258   Lipid Panel     Component Value Date/Time   CHOL 127 08/23/2018 0434   TRIG  358 (H) 08/23/2018 0434   HDL 27 (L) 08/23/2018 0434   CHOLHDL 4.7 08/23/2018 0434   VLDL 72 (H) 08/23/2018 0434   LDLCALC 28 08/23/2018 0434   HEMOGLOBIN A1C Lab Results  Component Value Date   HGBA1C 8.7 (H) 08/23/2018   MPG 202.99 08/23/2018   TSH No results for input(s): TSH in the last 8760 hours.  PRN Meds:. There are no discontinued medications. No outpatient medications have been marked as taking for the 02/22/19 encounter (Appointment) with Miquel Dunn, NP.    Cardiac Studies:   Carotid artery duplex  10/25/2018: Stenosis in the right internal carotid artery (50-69%). Stenosis in the right external carotid artery (<50%). Stenosis in the left internal carotid artery (16-49%). Stenosis in the left external carotid artery (<50%) Antegrade right vertebral artery flow. Antegrade left vertebral artery flow. Compared to the study done on 11/15/2017, no significant change. Follow up in six months is appropriate if clinically indicated.  Echocardiogram 08/22/2017:  1. The left ventricle has normal systolic function with an ejection fraction of 60-65%. The cavity size was normal. Left ventricular diastolic Doppler parameters are consistent with impaired relaxation. No evidence of left ventricular regional wall  motion abnormalities.  2. The right ventricle has normal systolic function. The cavity was normal. There is no increase in right ventricular wall thickness.  3. Left atrial size was mildly dilated.  4. There is mild mitral annular calcification present. No evidence of mitral valve stenosis. Trivial mitral regurgitation.  5. The aortic valve is tricuspid. Mild calcification of the aortic valve. Aortic valve regurgitation is mild to moderate by color flow Doppler. No stenosis of the aortic valve.  6. The aorta is normal unless otherwise noted.  7. Normal IVC size. PA systolic pressure 39 mmHg.  Lexiscan myoview stress test 10/24/2016: 1. Resting EKG shows T  inversion in high lateral leads. There was <1 mm ST depression and T inversion in inferior and lateral leads, reverted to baseline immediately into recovery.  Equivocal ischemic change due to pharmacologic stress with lexiscan. Symptoms included dyspnea and dizziness.  2. Very subtle basal inferior small sized reversible defect suggestive of ischemia noted. Normal LVEF without wall motion abnormality at 72%. Low risk study.  Cath 1996: [place]   80% stenosis ostial Left main, 30% stenosis proximal LAD, 80% stenosis proximal RCA CABG  -  LIMA to LAD, SVG to int, SVG to [place], [place]   Assessment:   No diagnosis found.  EKG 06/12/2017: Normal sinus rhythm at rate of 68 bpm, normal axis. High lateral ST segment depression with T-wave inversion, cannot exclude ischemia. Incomplete RBBB. No significant change from 10/14/2016.   CHA2DS2-VASCScore: Risk Score 8,  Yearly risk of stroke 6.7%. Recommendation: Anticoagulation   Loop recorder remote transmission 02/03/2019 by Dr. Curt Bears: Normal LINQ reviewed. No arrhythmias seen, normal histograms.  Recommendations:   Fortunately patient has had full recovery from her CVA without any residual deficits.  Feel that with the likely culprit of her left occipital CVA related to A. fib.  She does have high-grade left subclavian artery stenosis, likely overestimated by CT scan, as her carotid duplex continues to show antegrade flow.  She is without any symptoms of claudication or exertional dizziness.  We will continue with monitoring and medical management unless she were to become symptomatic.  Carotid stenosis has been stable, she will need repeat carotid duplex in 6 months for continued surveillance.  She does have orthostatic hypotension that has been overall stable.  In fact her standing blood pressure is actually elevated.  I have encouraged her to take valsartan hydrochlorothiazide 1 full tablet and to notify me if she has any worsening  dizziness or drops in her blood pressure. Lipids appear to be well controlled. We have discussed importance of better controlling her diabetes. She will discuss with Dr. Virgina Jock. Encouraged diet changes to also help.  She is on anticoagulation for paroxysmal A. fib.  She does have occasional episodes of palpitations.  Currently on metoprolol.  We will have her loop recorder transmissions transferred to [place] for continued care.  Depending upon findings on her loop recorder, may make a few adjustments to her medications.  Overall, patient is doing well.  Her daughter was present for telephone appointment today.  All questions were answered.  They are agreeable to coming into the office in 2 months for follow-up given her complex medical issues.    *I have discussed this case with Dr. Einar Gip and he  participated in formulating the plan.*    Miquel Dunn, MSN, APRN, FNP-C [facility] [place] Office: [phone] Fax: [phone]

## 2019-02-28 ENCOUNTER — Telehealth: Payer: Medicare Other | Admitting: Cardiology

## 2019-02-28 ENCOUNTER — Ambulatory Visit: Payer: Medicare Other | Admitting: Cardiology

## 2019-03-07 ENCOUNTER — Ambulatory Visit (INDEPENDENT_AMBULATORY_CARE_PROVIDER_SITE_OTHER): Payer: Medicare Other | Admitting: *Deleted

## 2019-03-07 DIAGNOSIS — I639 Cerebral infarction, unspecified: Secondary | ICD-10-CM | POA: Diagnosis not present

## 2019-03-07 LAB — CUP PACEART REMOTE DEVICE CHECK
Date Time Interrogation Session: 20210224230243
Implantable Pulse Generator Implant Date: 20200814

## 2019-03-07 NOTE — Progress Notes (Signed)
ILR Remote 

## 2019-03-11 ENCOUNTER — Telehealth: Payer: Self-pay | Admitting: Cardiology

## 2019-03-11 ENCOUNTER — Ambulatory Visit: Payer: Medicare Other | Admitting: Cardiology

## 2019-03-11 ENCOUNTER — Encounter: Payer: Self-pay | Admitting: Cardiology

## 2019-03-11 ENCOUNTER — Other Ambulatory Visit: Payer: Self-pay

## 2019-03-11 VITALS — Temp 97.1°F | Ht 60.0 in | Wt 168.0 lb

## 2019-03-11 DIAGNOSIS — I951 Orthostatic hypotension: Secondary | ICD-10-CM | POA: Diagnosis not present

## 2019-03-11 DIAGNOSIS — R0600 Dyspnea, unspecified: Secondary | ICD-10-CM | POA: Diagnosis not present

## 2019-03-11 DIAGNOSIS — I480 Paroxysmal atrial fibrillation: Secondary | ICD-10-CM | POA: Diagnosis not present

## 2019-03-11 DIAGNOSIS — I2510 Atherosclerotic heart disease of native coronary artery without angina pectoris: Secondary | ICD-10-CM | POA: Diagnosis not present

## 2019-03-11 DIAGNOSIS — I6523 Occlusion and stenosis of bilateral carotid arteries: Secondary | ICD-10-CM | POA: Diagnosis not present

## 2019-03-11 DIAGNOSIS — I771 Stricture of artery: Secondary | ICD-10-CM | POA: Diagnosis not present

## 2019-03-11 DIAGNOSIS — R0609 Other forms of dyspnea: Secondary | ICD-10-CM

## 2019-03-11 MED ORDER — VALSARTAN 160 MG PO TABS: 160.0000 mg | ORAL_TABLET | Freq: Every day | ORAL | 2 refills | Status: DC

## 2019-03-11 MED ORDER — VALSARTAN 160 MG PO TABS: 160.0000 mg | ORAL_TABLET | Freq: Every day | ORAL | 0 refills | Status: DC

## 2019-03-11 NOTE — Progress Notes (Signed)
Primary Physician:  Shon Baton, MD   Patient ID: Linda Oliver, female    DOB: [birth year], 86 y.o.   MRN: [id number]  Subjective:    Chief Complaint  Patient presents with  . Atrial Fibrillation  . Artery Stenosis    HPI: Linda Oliver  is a 86 y.o. female  with CAD s/p CABG x 3 in 1996 in [place], HTN, hyperlipidemia, diabetes with PVD and sleep apnea on CPAP but not compliant, carotid stenosis, and history of  autonomic orthostatic hypotension, admitted in August 2020 with medial left occipital infarct in embolic pattern by MRI after she presents from opthamology office with partial right eye vision loss. She has since underwent loop recorder implantation with Dr. Curt Bears and was found to have atrial fibrillation, in which she is now on Eliquis. Plavix and ASA have been discontinued. She did have CTA head/neck that showed left subclavian high-grade stenosis with irregular plaque/mural thrombus aortic isthmus and left subclavian origin, left PCA branch occlusion and left ICA 50% stenosis and cervical bulb. Carotid duplex shows antegrade flow; therefore, CTA felt to be overestimated. Recommended medical management unless symptomatic. CVA felt to be related to A fib.   Last seen in November 2020 virtually. She had some residual right eye vision loss, but this has resolved. She feels that she is doing well overall. She does have dizziness with sudden position changes. She has also noted worsening dyspnea on exertion. No chest pain. No syncope. Denies any arm fatigue or pain with activities.   Tolerating medications. No bleeding diathesis reported.   Past Medical History:  Diagnosis Date  . Anginal pain ([place])    occ  . Anxiety   . Broken ankle   . Bullous pemphigus   . CAD (coronary artery disease)    [place]   80% stenosis ostial Left main, 30% stenosis proximal LAD, 80% stenosis proximal RCA CABG  -  LIMA to LAD, SVG to int, SVG to [place], [place]   . Depression   .  Diabetic peripheral neuropathy ([place])   . GERD (gastroesophageal reflux disease)   . Headache(784.0)    hx migraines  . Heart murmur    mvp  . Hyperlipidemia   . Hypertension   . Hypertensive heart disease   . Nasal bleeding    occ due to O2  . OA (osteoarthritis)   . Obesity (BMI 30-39.9)   . Obstructive sleep apnea on CPAP   . Osteopenia   . Peripheral neuropathy   . Seizure ([place])    2009  . Sleep apnea    dx 27yrs ago doesnt use cpap but uses O2 at nite 2L  . Stones in the urinary tract   . Type 2 diabetes mellitus with vascular disease (Linda Oliver)   . UTI (lower urinary tract infection)     Past Surgical History:  Procedure Laterality Date  . ABDOMINAL HYSTERECTOMY  70's  . CARDIAC CATHETERIZATION    . CARPAL TUNNEL RELEASE Bilateral   . CHOLECYSTECTOMY    . CORONARY ARTERY BYPASS GRAFT  96  . LOOP RECORDER INSERTION N/A 08/24/2018   Procedure: LOOP RECORDER INSERTION;  Surgeon: Constance Haw, MD;  Location: [facility];  Service: Cardiovascular;  Laterality: N/A;  . SHOULDER ARTHROSCOPY     rt  . SHOULDER ARTHROSCOPY WITH ROTATOR CUFF REPAIR AND SUBACROMIAL DECOMPRESSION  12/01/2011   Procedure: SHOULDER ARTHROSCOPY WITH ROTATOR CUFF REPAIR AND SUBACROMIAL DECOMPRESSION;  Surgeon: Nita Sells,  MD;  Location: [facility];  Service: Orthopedics;  Laterality: Left;  shoulder debridement calcific tendonitis    Social History   Tobacco Use  . Smoking status: Former Smoker    Packs/day: 1.00    Years: 30.00    Pack years: 30.00    Types: Cigarettes    Quit date: 11/25/1986    Years since quitting: 32.3  . Smokeless tobacco: Never Used  Substance Use Topics  . Alcohol use: No     Review of Systems  Constitution: Negative for decreased appetite, malaise/fatigue, weight gain and weight loss.  Eyes: Negative for visual disturbance.  Cardiovascular: Positive for dyspnea on exertion (worsening) and irregular heartbeat (unchanged). Negative for chest  pain, claudication, leg swelling (right ankle chronic) and syncope.  Respiratory: Positive for cough (occasional) and snoring (on CPAP). Negative for hemoptysis and wheezing.   Endocrine: Negative for cold intolerance and heat intolerance.  Hematologic/Lymphatic: Does not bruise/bleed easily.  Skin: Negative for nail changes.  Musculoskeletal: Negative for myalgias.  Gastrointestinal: Negative for abdominal pain, nausea and vomiting.  Genitourinary: Positive for bladder incontinence (chronic).  Neurological: Positive for dizziness (worse). Negative for difficulty with concentration, focal weakness and headaches.  Psychiatric/Behavioral: Negative for altered mental status and suicidal ideas.  All other systems reviewed and are negative.     Objective:  Temperature (!) 97.1 F (36.2 C), height 5' (1.524 m), weight 168 lb 4.8 oz (76.3 kg). Body mass index is 32.87 kg/m.    Orthostatic VS for the past 72 hrs (Last 3 readings):  Orthostatic BP Patient Position BP Location Cuff Size Orthostatic Pulse  03/11/19 0919 (!) 75/40 Standing Left Arm Normal 54  03/11/19 0917 110/42 Sitting Left Arm Normal 59  03/11/19 0904 (!) 136/37 Supine Left Arm Normal 66     Physical Exam  Constitutional: She is oriented to person, place, and time. Vital signs are normal. She appears well-developed and well-nourished.  HENT:  Head: Normocephalic and atraumatic.  Neck: Carotid bruit is present.  Cardiovascular: Normal rate, regular rhythm, normal heart sounds and intact distal pulses.  Pulmonary/Chest: Effort normal and breath sounds normal. No accessory muscle usage. No respiratory distress.  Abdominal: Soft. Bowel sounds are normal.  Musculoskeletal:        General: Normal range of motion.     Cervical back: Normal range of motion.  Neurological: She is alert and oriented to person, place, and time.  Skin: Skin is warm and dry.  Vitals reviewed.  Radiology: No results found.  Laboratory  examination:    CMP Latest Ref Rng & Units 10/04/2018 08/23/2018 08/22/2018  Glucose 70 - 99 mg/dL 336(H) 150(H) -  BUN 8 - 23 mg/dL 17 15 -  Creatinine 0.44 - 1.00 mg/dL 1.27(H) 0.90 1.04(H)  Sodium 135 - 145 mmol/L 137 142 -  Potassium 3.5 - 5.1 mmol/L 4.2 3.7 -  Chloride 98 - 111 mmol/L 101 105 -  CO2 22 - 32 mmol/L 24 28 -  Calcium 8.9 - 10.3 mg/dL 8.7(L) 8.5(L) -  Total Protein 6.5 - 8.1 g/dL - 6.0(L) -  Total Bilirubin 0.3 - 1.2 mg/dL - 0.4 -  Alkaline Phos 38 - 126 U/L - 100 -  AST 15 - 41 U/L - 15 -  ALT 0 - 44 U/L - 12 -   CBC Latest Ref Rng & Units 10/04/2018 08/23/2018 08/22/2018  WBC 4.0 - 10.5 K/uL 9.3 8.6 9.6  Hemoglobin 12.0 - 15.0 g/dL 11.2(L) 11.1(L) 11.7(L)  Hematocrit 36.0 - 46.0 % 37.5 34.7(L)  36.7  Platelets 150 - 400 K/uL 279 240 258   Lipid Panel     Component Value Date/Time   CHOL 127 08/23/2018 0434   TRIG 358 (H) 08/23/2018 0434   HDL 27 (L) 08/23/2018 0434   CHOLHDL 4.7 08/23/2018 0434   VLDL 72 (H) 08/23/2018 0434   LDLCALC 28 08/23/2018 0434   HEMOGLOBIN A1C Lab Results  Component Value Date   HGBA1C 8.7 (H) 08/23/2018   MPG 202.99 08/23/2018   TSH No results for input(s): TSH in the last 8760 hours.  PRN Meds:. There are no discontinued medications. Current Meds  Medication Sig  . acetaminophen (TYLENOL) 500 MG tablet Take 500 mg by mouth every 4 (four) hours as needed for moderate pain.  Marland Kitchen amoxicillin (AMOXIL) 500 MG capsule Take 2,000 mg by mouth See admin instructions. Take 2,000 mg by mouth one hour prior to dental appointments or procedures- as directed  . apixaban (ELIQUIS) 5 MG TABS tablet Take 1 tablet (5 mg total) by mouth 2 (two) times daily.  . busPIRone (BUSPAR) 7.5 MG tablet Take 7.5 mg by mouth 2 (two) times daily.  . cyanocobalamin 1000 MCG tablet Take 1,000 mcg by mouth daily.  . DULoxetine (CYMBALTA) 60 MG capsule Take 60 mg by mouth daily.  Marland Kitchen ezetimibe (ZETIA) 10 MG tablet Take 1 tablet (10 mg total) by mouth daily.  .  fesoterodine (TOVIAZ) 8 MG TB24 tablet Take 8 mg by mouth every evening.   . fluticasone (FLONASE) 50 MCG/ACT nasal spray Place 1 spray into both nostrils 2 (two) times daily as needed for allergies or rhinitis.  Marland Kitchen gabapentin (NEURONTIN) 300 MG capsule Take 300 mg by mouth 2 (two) times daily.  Marland Kitchen glimepiride (AMARYL) 1 MG tablet Take 1 mg by mouth daily.  . hydrOXYzine (ATARAX/VISTARIL) 25 MG tablet Take 50 mg by mouth every evening.   . Insulin Glargine (LANTUS SOLOSTAR) 100 UNIT/ML Solostar Pen Inject 40 Units into the skin daily before breakfast.   . lactobacillus acidophilus (BACID) TABS tablet Take 1 tablet by mouth daily.  . metoprolol succinate (TOPROL-XL) 25 MG 24 hr tablet Take 12.5 mg by mouth 2 (two) times daily.  Marland Kitchen NAMZARIC 28-10 MG CP24 Take 1 capsule by mouth daily.  . pantoprazole (PROTONIX) 40 MG tablet Take 40 mg by mouth every evening.   Marland Kitchen Phenylephrine HCl (SINEX REGULAR NA) Place 1 spray into both nostrils as needed (for congestion).  . rosuvastatin (CRESTOR) 10 MG tablet Take 10 mg by mouth daily before supper.  . sitaGLIPtin (JANUVIA) 100 MG tablet Take 100 mg by mouth daily.  . valsartan-hydrochlorothiazide (DIOVAN-HCT) 160-25 MG tablet Take 1 tablet by mouth daily.    Cardiac Studies:   Carotid artery duplex  10/25/2018: Stenosis in the right internal carotid artery (50-69%). Stenosis in the right external carotid artery (<50%). Stenosis in the left internal carotid artery (16-49%). Stenosis in the left external carotid artery (<50%) Antegrade right vertebral artery flow. Antegrade left vertebral artery flow. Compared to the study done on 11/15/2017, no significant change. Follow up in six months is appropriate if clinically indicated.  Echocardiogram 08/22/2017:  1. The left ventricle has normal systolic function with an ejection fraction of 60-65%. The cavity size was normal. Left ventricular diastolic Doppler parameters are consistent with impaired relaxation. No  evidence of left ventricular regional wall  motion abnormalities.  2. The right ventricle has normal systolic function. The cavity was normal. There is no increase in right ventricular wall thickness.  3. Left  atrial size was mildly dilated.  4. There is mild mitral annular calcification present. No evidence of mitral valve stenosis. Trivial mitral regurgitation.  5. The aortic valve is tricuspid. Mild calcification of the aortic valve. Aortic valve regurgitation is mild to moderate by color flow Doppler. No stenosis of the aortic valve.  6. The aorta is normal unless otherwise noted.  7. Normal IVC size. PA systolic pressure 39 mmHg.  Lexiscan myoview stress test 10/24/2016: 1. Resting EKG shows T inversion in high lateral leads. There was <1 mm ST depression and T inversion in inferior and lateral leads, reverted to baseline immediately into recovery. Equivocal ischemic change due to pharmacologic stress with lexiscan. Symptoms included dyspnea and dizziness.  2. Very subtle basal inferior small sized reversible defect suggestive of ischemia noted. Normal LVEF without wall motion abnormality at 72%. Low risk study.  Cath 1996: [place]   80% stenosis ostial Left main, 30% stenosis proximal LAD, 80% stenosis proximal RCA CABG  -  LIMA to LAD, SVG to int, SVG to [place], [place]   Assessment:     ICD-10-CM   1. Paroxysmal atrial fibrillation (HCC) CHA2DS2-VASCScore: Risk Score 8,  Yearly risk of stroke 6.7%.   I48.0 EKG 12-Lead  2. Coronary artery disease involving native coronary artery of native heart without angina pectoris  I25.10   3. Stenosis of left subclavian artery (HCC)  I77.1   4. Carotid stenosis, bilateral  I65.23   5. Autonomic orthostatic hypotension  I95.1     EKG 03/11/2019: Normal sinus rhythm with PAC's at 67 bpm, normal axis, IRBBB, lateral ST depression, cannot exclude ischemia, unchanged from EKG in June 2019.   CHA2DS2-VASCScore: Risk Score 8,  Yearly  risk of stroke 6.7%. Recommendation: Anticoagulation    Recommendations:   Linda Oliver  is a 86 y.o. female  with CAD s/p CABG x 3 in 1996 in [place], HTN, hyperlipidemia, diabetes with PVD and sleep apnea on CPAP but not compliant, carotid stenosis, and history of  autonomic orthostatic hypotension, admitted in August 2020 with medial left occipital infarct in embolic pattern by MRI after she presents from opthamology office with partial right eye vision loss. She has since underwent loop recorder implantation with Dr. Curt Bears and was found to have atrial fibrillation, in which she is now on Eliquis. Plavix and ASA have been discontinued. She did have CTA head/neck that showed left subclavian high-grade stenosis with irregular plaque/mural thrombus aortic isthmus and left subclavian origin, left PCA branch occlusion and left ICA 50% stenosis and cervical bulb. Carotid duplex shows antegrade flow; therefore, CTA felt to be overestimated. Recommended medical management unless symptomatic. CVA felt to be related to A fib.   She reports worsening dizziness with sudden position changes, noted to be markedly orthostatic, which is chronic for her from autonomic dysfunction. I will changed her Valsartan HCT to plain Valsartan. She is also not wearing support stockings, which I have encouraged her to do so. Discussed careful position changes. No syncope.   She is also complaining of worsening dyspnea on exertion over the last few months. No chest pain. Will obtain echocardiogram to further evaluate. Suspect that some deconditioning may also be contributing to this. She did not have any episodes of A fib on her recent loop transmission. Will continue with monitoring, she request that monitoring be transferred to [place] as she will be seeing [place] regularly. Continue with anticoagulation.   She is asymptomatic in regards to left subclavian stenosis, will continue with  medical management. She is due for repeat carotid duplex  in the next 1-2 months for surveillance of carotid disease, will arrange for this to be performed with her echocardiogram. I will see her back in 6 weeks after her echo and carotid duplex with repeat orthostatics for follow up.    Miquel Dunn, MSN, APRN, FNP-C [facility]. [place] Office: [phone] Fax: [phone]

## 2019-03-14 ENCOUNTER — Telehealth: Payer: Self-pay

## 2019-03-14 NOTE — Telephone Encounter (Signed)
error 

## 2019-03-14 NOTE — Telephone Encounter (Signed)
-----   Message from Miquel Dunn, NP sent at 03/12/2019 12:54 PM EST ----- Regarding: loop recorder Can we see about having her loop recorder transferred to [place]?

## 2019-03-14 NOTE — Telephone Encounter (Signed)
Transfer has been requested. Waiting on response.  [employer] Serial # : [id number] Mosetta Anis

## 2019-03-22 ENCOUNTER — Other Ambulatory Visit: Payer: Self-pay

## 2019-03-22 ENCOUNTER — Ambulatory Visit: Payer: Medicare Other

## 2019-03-22 DIAGNOSIS — I6523 Occlusion and stenosis of bilateral carotid arteries: Secondary | ICD-10-CM

## 2019-03-24 ENCOUNTER — Other Ambulatory Visit: Payer: Self-pay | Admitting: Cardiology

## 2019-03-24 DIAGNOSIS — I6523 Occlusion and stenosis of bilateral carotid arteries: Secondary | ICD-10-CM

## 2019-03-25 NOTE — Progress Notes (Signed)
Correct. Thank you.

## 2019-03-27 ENCOUNTER — Telehealth: Payer: Self-pay

## 2019-03-27 NOTE — Telephone Encounter (Signed)
[facility] alert received for increased AF burden, known AF on [place].  Pt currently being treated by Dr. Einar Gip, 3/1 notes indicate they will be requesting transfer.  Attempted to call pt to confirm that she wishes transfer of care to DR. Ganji.  No answer, LVM with DC phone to call back.

## 2019-03-29 DIAGNOSIS — R0609 Other forms of dyspnea: Secondary | ICD-10-CM | POA: Diagnosis not present

## 2019-03-29 DIAGNOSIS — I119 Hypertensive heart disease without heart failure: Secondary | ICD-10-CM | POA: Diagnosis not present

## 2019-03-29 DIAGNOSIS — R0602 Shortness of breath: Secondary | ICD-10-CM | POA: Diagnosis not present

## 2019-03-29 DIAGNOSIS — I6529 Occlusion and stenosis of unspecified carotid artery: Secondary | ICD-10-CM | POA: Diagnosis not present

## 2019-03-29 DIAGNOSIS — I2721 Secondary pulmonary arterial hypertension: Secondary | ICD-10-CM | POA: Diagnosis not present

## 2019-03-29 DIAGNOSIS — I4891 Unspecified atrial fibrillation: Secondary | ICD-10-CM | POA: Diagnosis not present

## 2019-03-29 DIAGNOSIS — E1149 Type 2 diabetes mellitus with other diabetic neurological complication: Secondary | ICD-10-CM | POA: Diagnosis not present

## 2019-03-29 DIAGNOSIS — Z794 Long term (current) use of insulin: Secondary | ICD-10-CM | POA: Diagnosis not present

## 2019-03-29 DIAGNOSIS — N1831 Chronic kidney disease, stage 3a: Secondary | ICD-10-CM | POA: Diagnosis not present

## 2019-03-29 DIAGNOSIS — R627 Adult failure to thrive: Secondary | ICD-10-CM | POA: Diagnosis not present

## 2019-03-29 DIAGNOSIS — I6990 Unspecified sequelae of unspecified cerebrovascular disease: Secondary | ICD-10-CM | POA: Diagnosis not present

## 2019-03-29 DIAGNOSIS — R413 Other amnesia: Secondary | ICD-10-CM | POA: Diagnosis not present

## 2019-04-03 NOTE — Telephone Encounter (Signed)
The pt was returning Linda Oliver phone call. I let her know per Linda Oliver phone note we got an alert about an increase Af burden. I asked the pt if she wants Dr. Einar Gip to follow her [id number] and she states yes. I transferred her in [place].

## 2019-04-03 NOTE — Telephone Encounter (Signed)
Second attempt to reach pt, no answer.  Left message for pt to callback.

## 2019-04-06 DIAGNOSIS — Z4509 Encounter for adjustment and management of other cardiac device: Secondary | ICD-10-CM | POA: Diagnosis not present

## 2019-04-06 DIAGNOSIS — Z95818 Presence of other cardiac implants and grafts: Secondary | ICD-10-CM | POA: Diagnosis not present

## 2019-04-11 ENCOUNTER — Encounter: Payer: Self-pay | Admitting: Cardiology

## 2019-04-11 DIAGNOSIS — Z4509 Encounter for adjustment and management of other cardiac device: Secondary | ICD-10-CM

## 2019-04-11 DIAGNOSIS — Z9889 Other specified postprocedural states: Secondary | ICD-10-CM

## 2019-04-11 HISTORY — DX: Other specified postprocedural states: Z98.890

## 2019-04-11 HISTORY — DX: Encounter for adjustment and management of other cardiac device: Z45.09

## 2019-04-12 DIAGNOSIS — H338 Other retinal detachments: Secondary | ICD-10-CM | POA: Diagnosis not present

## 2019-04-12 DIAGNOSIS — H2102 Hyphema, left eye: Secondary | ICD-10-CM | POA: Diagnosis not present

## 2019-04-12 DIAGNOSIS — H3561 Retinal hemorrhage, right eye: Secondary | ICD-10-CM | POA: Diagnosis not present

## 2019-04-12 DIAGNOSIS — Z961 Presence of intraocular lens: Secondary | ICD-10-CM | POA: Diagnosis not present

## 2019-04-12 DIAGNOSIS — G453 Amaurosis fugax: Secondary | ICD-10-CM | POA: Diagnosis not present

## 2019-04-12 DIAGNOSIS — H353111 Nonexudative age-related macular degeneration, right eye, early dry stage: Secondary | ICD-10-CM | POA: Diagnosis not present

## 2019-04-12 DIAGNOSIS — H4312 Vitreous hemorrhage, left eye: Secondary | ICD-10-CM | POA: Diagnosis not present

## 2019-04-12 DIAGNOSIS — H35361 Drusen (degenerative) of macula, right eye: Secondary | ICD-10-CM | POA: Diagnosis not present

## 2019-04-12 DIAGNOSIS — H53132 Sudden visual loss, left eye: Secondary | ICD-10-CM | POA: Diagnosis not present

## 2019-04-12 DIAGNOSIS — H18052 Posterior corneal pigmentations, left eye: Secondary | ICD-10-CM | POA: Diagnosis not present

## 2019-04-15 ENCOUNTER — Other Ambulatory Visit: Payer: Self-pay

## 2019-04-15 ENCOUNTER — Ambulatory Visit (INDEPENDENT_AMBULATORY_CARE_PROVIDER_SITE_OTHER): Payer: Medicare Other | Admitting: Ophthalmology

## 2019-04-15 ENCOUNTER — Encounter (INDEPENDENT_AMBULATORY_CARE_PROVIDER_SITE_OTHER): Payer: Self-pay | Admitting: Ophthalmology

## 2019-04-15 DIAGNOSIS — H4312 Vitreous hemorrhage, left eye: Secondary | ICD-10-CM

## 2019-04-15 DIAGNOSIS — I6523 Occlusion and stenosis of bilateral carotid arteries: Secondary | ICD-10-CM

## 2019-04-15 DIAGNOSIS — H43812 Vitreous degeneration, left eye: Secondary | ICD-10-CM | POA: Diagnosis not present

## 2019-04-15 DIAGNOSIS — H43811 Vitreous degeneration, right eye: Secondary | ICD-10-CM | POA: Diagnosis not present

## 2019-04-15 NOTE — Assessment & Plan Note (Signed)
The nature of the vitreous hemorrhage was discussed with the patient as well as the common causes.   Patients with diabetic eye disease may develop retinal neovascularization.  Other eye conditions develop retinal  neovascularization secondary to retinal venous occlusions.   Vitreous hemorrhage may result from spontaneous vitreous detachment or retinal breaks.  Blunt trauma is a common cause as wll.  The need for serial evaluation of the fundus until  clear views are obtained was addressed. An occasional need  to monitor the condition by in office  B-scan ultrasonography in the case of dense vitreous hemorrhage was discussed.  Mild vitreous hemorrhage is likely to clear spontaneous.,  Patient will be instructed to hold 1 dose of Eliquis today and 1 dose tomorrow and then resume normal intake thereafter.  B-scan ultrasound today confirms the absence of major choroidal or retinal hemorrhage.  This hemorrhage is likely mostly garnered in the anterior chamber, or iris as there is a small collection of hemorrhage in the cul-de-sac of the posterior capsule holding the intraocular lens implant.

## 2019-04-17 ENCOUNTER — Encounter: Payer: Self-pay | Admitting: Cardiology

## 2019-04-17 DIAGNOSIS — I480 Paroxysmal atrial fibrillation: Secondary | ICD-10-CM

## 2019-04-17 HISTORY — DX: Paroxysmal atrial fibrillation: I48.0

## 2019-04-18 ENCOUNTER — Ambulatory Visit: Payer: Medicare Other | Admitting: Cardiology

## 2019-04-24 ENCOUNTER — Telehealth: Payer: Self-pay | Admitting: Cardiology

## 2019-04-24 NOTE — Telephone Encounter (Signed)
Unscheduled (Alert) 04/22/2019:  The presenting rhythm is sinus rhythm. There were 0 symptomatic patient activations recorded. There were 6 AF episodes and 0 AT episodes recorded, representing a 2.6 % cumulative AT/AF burden. The longest episode lasted 00:02:20:00 in duration.  First documented episode. Patient is on Eliquis. Left message to continue the same. Continue observation.  JG

## 2019-04-29 ENCOUNTER — Other Ambulatory Visit: Payer: Medicare Other

## 2019-04-30 ENCOUNTER — Ambulatory Visit (INDEPENDENT_AMBULATORY_CARE_PROVIDER_SITE_OTHER): Payer: Medicare Other | Admitting: Ophthalmology

## 2019-04-30 ENCOUNTER — Encounter (INDEPENDENT_AMBULATORY_CARE_PROVIDER_SITE_OTHER): Payer: Self-pay | Admitting: Ophthalmology

## 2019-04-30 ENCOUNTER — Other Ambulatory Visit: Payer: Self-pay

## 2019-04-30 DIAGNOSIS — I6523 Occlusion and stenosis of bilateral carotid arteries: Secondary | ICD-10-CM

## 2019-04-30 DIAGNOSIS — H4312 Vitreous hemorrhage, left eye: Secondary | ICD-10-CM | POA: Diagnosis not present

## 2019-04-30 NOTE — Progress Notes (Signed)
04/30/2019     CHIEF COMPLAINT Patient presents for Retina Follow Up   HISTORY OF PRESENT ILLNESS: Linda Oliver is a 86 y.o. female who presents to the clinic today for:   HPI    Retina Follow Up    Patient presents with  Other.  In left eye.  Severity is moderate.  Since onset it is stable.  I, the attending physician,  performed the HPI with the patient and updated documentation appropriately.          Comments    2 Week f\u OS. FP.  Pt states no changes in vision. Pt states she had a haze come down over OS.  BGL: 145        Last edited by Tilda Franco on 04/30/2019  3:02 PM. (History)      Referring physician: Shon Baton, MD [address] [place],  [place] [address]  HISTORICAL INFORMATION:   Selected notes from the MEDICAL RECORD NUMBER    Lab Results  Component Value Date   HGBA1C 8.7 (H) 08/23/2018     CURRENT MEDICATIONS: No current outpatient medications on file. (Ophthalmic Drugs)   No current facility-administered medications for this visit. (Ophthalmic Drugs)   Current Outpatient Medications (Other)  Medication Sig  . acetaminophen (TYLENOL) 500 MG tablet Take 500 mg by mouth every 4 (four) hours as needed for moderate pain.  Marland Kitchen amoxicillin (AMOXIL) 500 MG capsule Take 2,000 mg by mouth See admin instructions. Take 2,000 mg by mouth one hour prior to dental appointments or procedures- as directed  . apixaban (ELIQUIS) 5 MG TABS tablet Take 1 tablet (5 mg total) by mouth 2 (two) times daily.  . B-D UF III MINI PEN NEEDLES 31G X 5 MM MISC SMARTSIG:1 Each SUB-Q Daily  . busPIRone (BUSPAR) 7.5 MG tablet Take 7.5 mg by mouth 2 (two) times daily.  . clopidogrel (PLAVIX) 75 MG tablet Take 75 mg by mouth daily.  . cyanocobalamin 1000 MCG tablet Take 1,000 mcg by mouth daily.  . DULoxetine (CYMBALTA) 60 MG capsule Take 60 mg by mouth daily.  Marland Kitchen ezetimibe (ZETIA) 10 MG tablet Take 1 tablet (10 mg total) by mouth daily.  . fesoterodine (TOVIAZ) 8 MG TB24  tablet Take 8 mg by mouth every evening.   . fluticasone (FLONASE) 50 MCG/ACT nasal spray Place 1 spray into both nostrils 2 (two) times daily as needed for allergies or rhinitis.  Marland Kitchen FREESTYLE LITE test strip   . furosemide (LASIX) 20 MG tablet Take 20 mg by mouth daily.  Marland Kitchen gabapentin (NEURONTIN) 300 MG capsule Take 300 mg by mouth 2 (two) times daily.  Marland Kitchen glimepiride (AMARYL) 1 MG tablet Take 1 mg by mouth daily.  . hydrOXYzine (ATARAX/VISTARIL) 25 MG tablet Take 50 mg by mouth every evening.   . Insulin Glargine (LANTUS SOLOSTAR) 100 UNIT/ML Solostar Pen Inject 40 Units into the skin daily before breakfast.   . lactobacillus acidophilus (BACID) TABS tablet Take 1 tablet by mouth daily.  . metoprolol succinate (TOPROL-XL) 25 MG 24 hr tablet Take 12.5 mg by mouth 2 (two) times daily.  Marland Kitchen NAMZARIC 28-10 MG CP24 Take 1 capsule by mouth daily.  . pantoprazole (PROTONIX) 40 MG tablet Take 40 mg by mouth every evening.   Marland Kitchen Phenylephrine HCl (SINEX REGULAR NA) Place 1 spray into both nostrils as needed (for congestion).  . rosuvastatin (CRESTOR) 10 MG tablet Take 10 mg by mouth daily before supper.  . sitaGLIPtin (JANUVIA) 100 MG tablet Take  100 mg by mouth daily.  . valsartan (DIOVAN) 160 MG tablet Take 1 tablet (160 mg total) by mouth daily.   No current facility-administered medications for this visit. (Other)      REVIEW OF SYSTEMS:    ALLERGIES Allergies  Allergen Reactions  . Percocet [Oxycodone-Acetaminophen] Nausea And Vomiting  . Latex Itching and Rash    EKG leads    PAST MEDICAL HISTORY Past Medical History:  Diagnosis Date  . Anginal pain ([place])    occ  . Anxiety   . Broken ankle   . Bullous pemphigus   . CAD (coronary artery disease)    [place]   80% stenosis ostial Left main, 30% stenosis proximal LAD, 80% stenosis proximal RCA CABG  -  LIMA to LAD, SVG to int, SVG to [place], [place]   . Depression   . Diabetic peripheral neuropathy ([place])   .  Encounter for loop recorder check 04/11/2019  . GERD (gastroesophageal reflux disease)   . H/O: stroke: Left occipital stroke and vision loss 08/24/2018  . Headache(784.0)    hx migraines  . Heart murmur    mvp  . Hyperlipidemia   . Hypertension   . Hypertensive heart disease   . Loop recorder Reveal 08/24/2018 04/11/2019   Scheduled Remote loop recorder check 03/10/2019: The presenting rhythm is sinus bradycardia. There were 0 symptomatic patient activations recorded. There were 275 AF episodes and 0 AT episodes recorded, representing a 4.2 % cumulative AT/AF burden. The longest episode lasted 00:03:40:00 in duration. Some false with PAC. There were 0 tachy episodes detected. There were 0 pause episodes detected. Th  . Nasal bleeding    occ due to O2  . OA (osteoarthritis)   . Obesity (BMI 30-39.9)   . Obstructive sleep apnea on CPAP   . Osteopenia   . Paroxysmal atrial fibrillation ([place]) 04/17/2019  . Peripheral neuropathy   . Seizure ([place])    2009  . Sleep apnea    dx 85yrs ago doesnt use cpap but uses O2 at nite 2L  . Stones in the urinary tract   . Type 2 diabetes mellitus with vascular disease ([place])   . UTI (lower urinary tract infection)    Past Surgical History:  Procedure Laterality Date  . ABDOMINAL HYSTERECTOMY  70's  . CARDIAC CATHETERIZATION    . CARPAL TUNNEL RELEASE Bilateral   . CATARACT EXTRACTION W/PHACO Bilateral   . CHOLECYSTECTOMY    . CORONARY ARTERY BYPASS GRAFT  96  . LOOP RECORDER INSERTION N/A 08/24/2018   Procedure: LOOP RECORDER INSERTION;  Surgeon: Constance Haw, MD;  Location: [facility];  Service: Cardiovascular;  Laterality: N/A;  . SHOULDER ARTHROSCOPY     rt  . SHOULDER ARTHROSCOPY WITH ROTATOR CUFF REPAIR AND SUBACROMIAL DECOMPRESSION  12/01/2011   Procedure: SHOULDER ARTHROSCOPY WITH ROTATOR CUFF REPAIR AND SUBACROMIAL DECOMPRESSION;  Surgeon: Nita Sells, MD;  Location: [facility];  Service: Orthopedics;  Laterality: Left;   shoulder debridement calcific tendonitis    FAMILY HISTORY Family History  Problem Relation Age of Onset  . Coronary artery disease Mother   . Heart disease Father   . Cancer Father        skin  . Cancer Brother   . Cancer - Colon Brother        brain, Lymphoma    SOCIAL HISTORY Social History   Tobacco Use  . Smoking status: Former Smoker    Packs/day: 1.00  Years: 30.00    Pack years: 30.00    Types: Cigarettes    Quit date: 11/25/1986    Years since quitting: 32.4  . Smokeless tobacco: Never Used  Substance Use Topics  . Alcohol use: No  . Drug use: No         OPHTHALMIC EXAM: Base Eye Exam    Visual Acuity (Snellen - Linear)      Right Left   Dist cc 20/40 20/80   Dist ph cc NI NI       Tonometry (Tonopen, 3:08 PM)      Right Left   Pressure 14 12       Pupils      Pupils Dark Light Shape React APD   Right PERRL 5 4 Round Brisk None   Left PERRL 5 4 Round Brisk None       Visual Fields (Counting fingers)      Left Right    Full Full       Neuro/Psych    Oriented x3: Yes   Mood/Affect: Normal       Dilation    Left eye: 1.0% Mydriacyl, 2.5% Phenylephrine @ 3:08 PM        Slit Lamp and Fundus Exam    External Exam      Right Left   External Normal Normal       Slit Lamp Exam      Right Left   Lids/Lashes Normal Normal   Conjunctiva/Sclera White and quiet White and quiet   Cornea Clear Clear   Anterior Chamber Deep and quiet Deep and quiet   Iris Round and reactive Round and reactive   Lens Posterior chamber intraocular lens Posterior chamber intraocular lens,, with heme in the cul de sac of capsule   Vitreous Normal Normal       Fundus Exam      Right Left   Disc Normal    C/D Ratio  no details   Macula Normal    Vessels Normal    Periphery Normal           IMAGING AND PROCEDURES  Imaging and Procedures for 04/30/19           ASSESSMENT/PLAN:  No problem-specific Assessment & Plan notes found for this  encounter.      ICD-10-CM   1. Vitreous hemorrhage of left eye (HCC)  H43.12 Color Fundus Photography Optos - OU - Both Eyes    1.  2.  3.  Ophthalmic Meds Ordered this visit:  No orders of the defined types were placed in this encounter.      No follow-ups on file.  There are no Patient Instructions on file for this visit.   Explained the diagnoses, plan, and follow up with the patient and they expressed understanding.  Patient expressed understanding of the importance of proper follow up care.   Clent Demark Adlai Sinning M.D. Diseases & Surgery of the Retina and Vitreous [facility] [place] 04/30/19     Abbreviations: M myopia (nearsighted); A astigmatism; H hyperopia (farsighted); P presbyopia; Mrx spectacle prescription;  CTL contact lenses; OD right eye; OS left eye; OU both eyes  XT exotropia; ET esotropia; PEK punctate epithelial keratitis; PEE punctate epithelial erosions; DES dry eye syndrome; MGD meibomian gland dysfunction; ATs artificial tears; PFAT's preservative free artificial tears; [place] nuclear sclerotic cataract; PSC posterior subcapsular cataract; ERM epi-retinal membrane; PVD posterior vitreous detachment; RD retinal detachment; DM diabetes mellitus; DR diabetic retinopathy; NPDR non-proliferative  diabetic retinopathy; PDR proliferative diabetic retinopathy; CSME clinically significant macular edema; DME diabetic macular edema; dbh dot blot hemorrhages; CWS cotton wool spot; POAG primary open angle glaucoma; C/D cup-to-disc ratio; HVF humphrey visual field; GVF goldmann visual field; OCT optical coherence tomography; IOP intraocular pressure; BRVO Branch retinal vein occlusion; CRVO central retinal vein occlusion; CRAO central retinal artery occlusion; BRAO branch retinal artery occlusion; RT retinal tear; SB scleral buckle; PPV pars plana vitrectomy; VH Vitreous hemorrhage; PRP panretinal laser photocoagulation; IVK intravitreal kenalog; VMT vitreomacular  traction; MH Macular hole;  NVD neovascularization of the disc; NVE neovascularization elsewhere; AREDS age related eye disease study; ARMD age related macular degeneration; POAG primary open angle glaucoma; EBMD epithelial/anterior basement membrane dystrophy; ACIOL anterior chamber intraocular lens; IOL intraocular lens; PCIOL posterior chamber intraocular lens; Phaco/IOL phacoemulsification with intraocular lens placement; [place] photorefractive keratectomy; LASIK laser assisted in situ keratomileusis; HTN hypertension; DM diabetes mellitus; COPD chronic obstructive pulmonary disease

## 2019-05-03 ENCOUNTER — Ambulatory Visit: Payer: Medicare Other | Admitting: Cardiology

## 2019-05-07 DIAGNOSIS — Z4509 Encounter for adjustment and management of other cardiac device: Secondary | ICD-10-CM | POA: Diagnosis not present

## 2019-05-07 DIAGNOSIS — Z95818 Presence of other cardiac implants and grafts: Secondary | ICD-10-CM | POA: Diagnosis not present

## 2019-05-09 NOTE — Progress Notes (Addendum)
Primary Physician/Referring:  Shon Baton, MD  Patient ID: Linda Oliver, female    DOB: [birth year], 86 y.o.   MRN: [id number]  Chief Complaint  Patient presents with  . Atrial Fibrillation  . Coronary Artery Disease  . Carotid Stenosis  . Hypotension  . Follow-up    C/O shortness of breath   HPI:    Linda Oliver  is a 86 y.o. Caucasian female patient with CAD s/p CABG x 3 in 1996 in [place], HTN, hyperlipidemia, diabetes with PVD and sleep apnea on CPAP and now compliant, carotid stenosis, and history of  autonomic orthostatic hypotension, medial left occipital infarct in embolic pattern by MRI in 2020 and loop recorder implanted showed paroxysmal atrial fibrillation, for which she is now on Eliquis.  This is a sick work in visit with marked worsening dyspnea, markedly reduced exercise tolerance and is accompanied by her daughter.  Patient has furosemide that she was supposed to take on a as needed basis but over the past 1 week to 10 days her daughter has been giving Lasix every day and she has had about a 5 pound weight loss.  Still there is persistence of dyspnea and also fatigue.  Leg edema is also mildly improved.  Tolerating anticoagulation without bleeding diathesis, states that blood pressure has been controlled, no dizziness or syncope.   Past Medical History:  Diagnosis Date  . Anginal pain ([place])    occ  . Anxiety   . Broken ankle   . Bullous pemphigus   . CAD (coronary artery disease)    [place]   80% stenosis ostial Left main, 30% stenosis proximal LAD, 80% stenosis proximal RCA CABG  -  LIMA to LAD, SVG to int, SVG to [place], [place]   . Depression   . Diabetic peripheral neuropathy ([place])   . Encounter for loop recorder check 04/11/2019  . GERD (gastroesophageal reflux disease)   . H/O: stroke: Left occipital stroke and vision loss 08/24/2018  . Headache(784.0)    hx migraines  . Heart murmur    mvp  . Hyperlipidemia   . Hypertension   .  Hypertensive heart disease   . Loop recorder Reveal 08/24/2018 04/11/2019   Scheduled Remote loop recorder check 03/10/2019: The presenting rhythm is sinus bradycardia. There were 0 symptomatic patient activations recorded. There were 275 AF episodes and 0 AT episodes recorded, representing a 4.2 % cumulative AT/AF burden. The longest episode lasted 00:03:40:00 in duration. Some false with PAC. There were 0 tachy episodes detected. There were 0 pause episodes detected. Th  . Nasal bleeding    occ due to O2  . OA (osteoarthritis)   . Obesity (BMI 30-39.9)   . Obstructive sleep apnea on CPAP   . Osteopenia   . Paroxysmal atrial fibrillation ([place]) 04/17/2019  . Peripheral neuropathy   . Seizure ([place])    2009  . Sleep apnea    dx 49yrs ago doesnt use cpap but uses O2 at nite 2L  . Stones in the urinary tract   . Type 2 diabetes mellitus with vascular disease ([place])   . UTI (lower urinary tract infection)    Past Surgical History:  Procedure Laterality Date  . ABDOMINAL HYSTERECTOMY  70's  . CARDIAC CATHETERIZATION    . CARPAL TUNNEL RELEASE Bilateral   . CATARACT EXTRACTION W/PHACO Bilateral   . CHOLECYSTECTOMY    . CORONARY ARTERY BYPASS GRAFT  96  . LOOP RECORDER INSERTION N/A 08/24/2018  Procedure: LOOP RECORDER INSERTION;  Surgeon: Constance Haw, MD;  Location: [facility];  Service: Cardiovascular;  Laterality: N/A;  . SHOULDER ARTHROSCOPY     rt  . SHOULDER ARTHROSCOPY WITH ROTATOR CUFF REPAIR AND SUBACROMIAL DECOMPRESSION  12/01/2011   Procedure: SHOULDER ARTHROSCOPY WITH ROTATOR CUFF REPAIR AND SUBACROMIAL DECOMPRESSION;  Surgeon: Nita Sells, MD;  Location: [facility];  Service: Orthopedics;  Laterality: Left;  shoulder debridement calcific tendonitis   Family History  Problem Relation Age of Onset  . Coronary artery disease Mother   . Heart disease Father   . Cancer Father        skin  . Cancer Brother   . Cancer - Colon Brother        brain, Lymphoma      Social History   Tobacco Use  . Smoking status: Former Smoker    Packs/day: 1.00    Years: 30.00    Pack years: 30.00    Types: Cigarettes    Quit date: 11/25/1986    Years since quitting: 32.4  . Smokeless tobacco: Never Used  Substance Use Topics  . Alcohol use: No   Marital Status: Widowed  ROS  Review of Systems  Constitution: Negative for chills, decreased appetite, malaise/fatigue and weight gain.  Cardiovascular: Positive for dyspnea on exertion (worsening), irregular heartbeat (unchanged), leg swelling (right ankle (chronic)) and syncope. Negative for chest pain and claudication.  Respiratory: Positive for cough (occasional) and snoring (on CPAP). Negative for hemoptysis and wheezing.   Endocrine: Negative for cold intolerance and heat intolerance.  Hematologic/Lymphatic: Does not bruise/bleed easily.  Musculoskeletal: Negative for joint swelling.  Gastrointestinal: Negative for abdominal pain, anorexia, change in bowel habit, hematochezia and melena.  Genitourinary: Positive for bladder incontinence (chronic).  Neurological: Positive for dizziness (worse). Negative for headaches and light-headedness.  Psychiatric/Behavioral: Negative for depression and substance abuse.   Objective  Blood pressure (!) 136/44, pulse 65, temperature (!) 96.7 F (35.9 C), temperature source Temporal, resp. rate 17, height 5' (1.524 m), weight 164 lb (74.4 kg).  Vitals with BMI 05/10/2019 03/11/2019 03/11/2019  Height 5\' 0"  5\' 0"  5\' 0"   Weight 164 lbs 168 lbs 168 lbs  BMI 32.03 71.24 58.09  Systolic 983 - -  Diastolic 44 - -  Pulse 65 - -     Physical Exam  Constitutional: She appears well-developed and well-nourished.  HENT:  Head: Atraumatic.  Eyes: Conjunctivae are normal.  Neck: No JVD present. No thyromegaly present.  Carotid bruit is present.  Cardiovascular: Normal rate, regular rhythm and intact distal pulses. Exam reveals no gallop.  No murmur heard. Pulmonary/Chest: Effort  normal and breath sounds normal. No accessory muscle usage. No respiratory distress.  Abdominal: Soft. Bowel sounds are normal.  Musculoskeletal:        General: Normal range of motion.     Cervical back: Neck supple.  Neurological: She is alert.  Skin: Skin is warm and dry.  Psychiatric: She has a normal mood and affect.   Laboratory examination:   Recent Labs    08/23/18 0434 10/04/18 1046 05/10/19 1442  NA 142 137 140  K 3.7 4.2 3.4*  CL 105 101 99  CO2 28 24 27   GLUCOSE 150* 336* 168*  BUN 15 17 13   CREATININE 0.90 1.27* 1.10*  CALCIUM 8.5* 8.7* 8.5*  GFRNONAA 59* 39* 46*  GFRAA >60 45* 53*   estimated creatinine clearance is 34.3 mL/min (A) (by C-G formula based on SCr of 1.1 mg/dL (H)).  CMP Latest Ref Rng & Units 05/10/2019 10/04/2018 08/23/2018  Glucose 65 - 99 mg/dL 168(H) 336(H) 150(H)  BUN 8 - 27 mg/dL 13 17 15   Creatinine 0.57 - 1.00 mg/dL 1.10(H) 1.27(H) 0.90  Sodium 134 - 144 mmol/L 140 137 142  Potassium 3.5 - 5.2 mmol/L 3.4(L) 4.2 3.7  Chloride 96 - 106 mmol/L 99 101 105  CO2 20 - 29 mmol/L 27 24 28   Calcium 8.7 - 10.3 mg/dL 8.5(L) 8.7(L) 8.5(L)  Total Protein 6.5 - 8.1 g/dL - - 6.0(L)  Total Bilirubin 0.3 - 1.2 mg/dL - - 0.4  Alkaline Phos 38 - 126 U/L - - 100  AST 15 - 41 U/L - - 15  ALT 0 - 44 U/L - - 12   CBC Latest Ref Rng & Units 10/04/2018 08/23/2018 08/22/2018  WBC 4.0 - 10.5 K/uL 9.3 8.6 9.6  Hemoglobin 12.0 - 15.0 g/dL 11.2(L) 11.1(L) 11.7(L)  Hematocrit 36.0 - 46.0 % 37.5 34.7(L) 36.7  Platelets 150 - 400 K/uL 279 240 258   Lipid Panel     Component Value Date/Time   CHOL 127 08/23/2018 0434   TRIG 358 (H) 08/23/2018 0434   HDL 27 (L) 08/23/2018 0434   CHOLHDL 4.7 08/23/2018 0434   VLDL 72 (H) 08/23/2018 0434   LDLCALC 28 08/23/2018 0434   HEMOGLOBIN A1C Lab Results  Component Value Date   HGBA1C 8.7 (H) 08/23/2018   MPG 202.99 08/23/2018   TSH No results for input(s): TSH in the last 8760 hours.   BNP (last 3 results) Recent  Labs    05/10/19 1442  BNP 323.0*   External labs:   Cholesterol, total 127.000 08/23/2018 HDL 27.000 08/23/2018 LDL 28.000 08/23/2018 Triglycerides 358.000 08/23/2018  A1C 8.400 % 03/29/2019 TSH 1.940 04/01/2019  Hemoglobin 11.200 10/04/2018 INR 1.100 08/22/2018 Platelets 279.000 10/04/2018  Creatinine, Serum 1.000 mg/ 04/01/2019 Potassium 4.200 10/04/2018 Magnesium N/D ALT (SGPT) 12.000 uni 04/01/2019  BNP N/D  Medications and allergies   Allergies  Allergen Reactions  . Percocet [Oxycodone-Acetaminophen] Nausea And Vomiting  . Latex Itching and Rash    EKG leads     Current Outpatient Medications  Medication Instructions  . acetaminophen (TYLENOL) 500 mg, Oral, Every 4 hours PRN  . apixaban (ELIQUIS) 5 mg, Oral, 2 times daily  . B-D UF III MINI PEN NEEDLES 31G X 5 MM MISC SMARTSIG:1 Each SUB-Q Daily  . busPIRone (BUSPAR) 7.5 mg, Oral, 2 times daily  . DULoxetine (CYMBALTA) 60 mg, Oral, Daily  . fesoterodine (TOVIAZ) 8 mg, Oral, Every evening  . fluticasone (FLONASE) 50 MCG/ACT nasal spray 1 spray, Each Nare, 2 times daily PRN  . FREESTYLE LITE test strip No dose, route, or frequency recorded.  . furosemide (LASIX) 20 mg, Oral, Daily  . gabapentin (NEURONTIN) 300 mg, Oral, 2 times daily  . glimepiride (AMARYL) 1 mg, Oral, Daily  . hydrOXYzine (ATARAX/VISTARIL) 50 mg, Oral, Every evening  . isosorbide dinitrate (ISORDIL) 30 mg, Oral, 3 times daily  . Lantus SoloStar 40 Units, Subcutaneous, Daily before breakfast  . metoprolol succinate (TOPROL-XL) 12.5 mg, Oral, 2 times daily  . NAMZARIC 28-10 MG CP24 1 capsule, Oral, Daily  . pantoprazole (PROTONIX) 40 mg, Oral, Every evening  . potassium chloride SA (KLOR-CON) 20 MEQ tablet 20 mEq, Oral, Daily PRN, Take two tablets on picking up medication  . rosuvastatin (CRESTOR) 10 mg, Oral, Daily before supper  . sitaGLIPtin (JANUVIA) 100 mg, Oral, Daily  . valsartan (DIOVAN) 160 mg, Oral, Daily   Radiology:  CT Angio Neck  08/23/2018: 1. Left subclavian origin high-grade stenosis with delayed enhancement of the distal left vertebral artery. Additionally, there is irregular plaque/mural thrombus involving the aortic isthmus and left subclavian origin. 2. Left PCA branch occlusion correlating with the known acute infarct. 3. Cervical carotid atherosclerosis with up to 50% stenosis at the left ICA bulb. 4. 3 cm right thyroid mass, recommend elective ultrasound.   Radiology  CXR PA/Lat 05/10/2019: The heart size and mediastinal contours are within normal limits. Prior CABG again noted. Aortic atherosclerosis incidentally noted. Cardiac loop recorder is seen anterior chest wall. Both lungs are clear. The visualized skeletal structures are unremarkable.  Cardiac Studies:   Coronary angiogram 1996: [place]   80% stenosis ostial Left main, 30% stenosis proximal LAD, 80% stenosis proximal RCA CABG  -  LIMA to LAD, SVG to int, SVG to [place], [place]   Lexiscan myoview stress test 10/24/2016: 1. Resting EKG shows T inversion in high lateral leads. There was <1 mm ST depression and T inversion in inferior and lateral leads, reverted to baseline immediately into recovery. Equivocal ischemic change due to pharmacologic stress with lexiscan. Symptoms included dyspnea and dizziness.  2. Very subtle basal inferior small sized reversible defect suggestive of ischemia noted. Normal LVEF without wall motion abnormality at 72%. Low risk study.  Echocardiogram 03/22/2019:  1. Normal LV systolic function with visual EF 55-60%. Left ventricle cavity is normal in size. Moderate left ventricular hypertrophy. Normal global wall motion. No obvious regional wall motion abnormalities. Doppler evidence of grade II diastolic dysfunction, elevated LAP. Calculated EF 59%. 2. Left atrial cavity is mildly dilated. 3. Grossly, right atrial cavity is mildly dilated. 4. Moderate aortic regurgitation. 5. Mild to moderate mitral  regurgitation. 6. Mild to moderate tricuspid regurgitation. Moderate pulmonary hypertension. RVSP measures 59 mmHg. 7. Mild pulmonic regurgitation. 8. Prior study dated 08/22/2017, LVEF of 60-65%, mild to moderate AR. Otherwise no significant change.   Carotid artery duplex 03/22/2019:  Stenosis in the right internal carotid artery (50-69%). Stenosis in the right external carotid artery (<50%).  Stenosis in the left internal carotid artery (16-49%). Stenosis in the left external carotid artery (<50%)  Antegrade right vertebral artery flow. Antegrade left vertebral artery flow.  Compared to the study done on 10/152020, no significant change. Follow up in six months is appropriate if clinically indicated.  EKG    03/11/2019: Normal sinus rhythm with PAC's at 67 bpm, normal axis, IRBBB, lateral ST depression, cannot exclude ischemia, unchanged from EKG in June 2019.  Assessment     ICD-10-CM   1. Paroxysmal atrial fibrillation (HCC) CHA2DS2-VASCScore: Risk Score 8,  Yearly risk of stroke 6.7%.   I48.0 CANCELED: EKG 12-Lead  2. Coronary artery disease involving native coronary artery of native heart without angina pectoris  I25.10   3. Carotid stenosis, asymptomatic, bilateral  I65.23   4. Autonomic orthostatic hypotension  I95.1   5. Acute on chronic diastolic heart failure (HCC)  I50.33 Brain natriuretic peptide    Basic metabolic panel    PCV MYOCARDIAL PERFUSION WITH LEXISCAN    DG Chest 2 View    furosemide (LASIX) 20 MG tablet    isosorbide dinitrate (ISORDIL) 30 MG tablet    potassium chloride SA (KLOR-CON) 20 MEQ tablet  6. Dyspnea on exertion  R06.00 DG Chest 2 View     Meds ordered this encounter  Medications  . furosemide (LASIX) 20 MG tablet    Sig: Take 1 tablet (20 mg total) by  mouth daily.    Dispense:  90 tablet    Refill:  1  . isosorbide dinitrate (ISORDIL) 30 MG tablet    Sig: Take 1 tablet (30 mg total) by mouth 3 (three) times daily.    Dispense:  90 tablet     Refill:  2  . potassium chloride SA (KLOR-CON) 20 MEQ tablet    Sig: Take 1 tablet (20 mEq total) by mouth daily as needed (With furosemide). Take two tablets on picking up medication    Dispense:  30 tablet    Refill:  2    Medications Discontinued During This Encounter  Medication Reason  . lactobacillus acidophilus (BACID) TABS tablet Error  . cyanocobalamin 1000 MCG tablet Error  . amoxicillin (AMOXIL) 500 MG capsule Completed Course  . furosemide (LASIX) 20 MG tablet Reorder  . clopidogrel (PLAVIX) 75 MG tablet Change in therapy  . ezetimibe (ZETIA) 10 MG tablet Patient has not taken in last 30 days  . Phenylephrine HCl (SINEX REGULAR NA) No longer needed (for PRN medications)    Recommendations:   Linda Oliver  is a 86 y.o. Caucasian female patient with CAD s/p CABG x 3 in 1996 in [place], HTN, hyperlipidemia, diabetes with PVD and sleep apnea on CPAP and now compliant, carotid stenosis, and history of  autonomic orthostatic hypotension, medial left occipital infarct in embolic pattern by MRI in 2020 and loop recorder implanted showed paroxysmal atrial fibrillation, for which she is now on Eliquis.    This is a sick work in visit with marked worsening dyspnea, markedly reduced exercise tolerance and is accompanied by her daughter.   Echocardiogram does reveal grade 2 diastolic dysfunction which may explain her dyspnea, she is presently in acute decompensated heart failure, since her PCP placed her on furosemide she has lost 5 pounds in weight but still persist to have cough and dyspnea.  I have added isosorbide dinitrate 30 mg 3 times daily.  Advised her to change furosemide from every other day to daily.  Will obtain a chest x-ray, BNP and a BMP today.  Schedule for a Lexiscan Sestamibi stress test to evaluate for myocardial ischemia. Patient unable to do treadmill stress testing due to dyspnea and gait instability.  CXR reviewed, no acute abnormality.   If there is no significant  progression or change in nuclear stress test which was previously mildly abnormal, she will need pulmonary evaluation and also possibly consider pulmonary rehabilitation.   OV in 2-3 weeks.   Addendum: S. Cr stable. Hypokalemia noted and Rx sent for supplements.   Adrian Prows, MD, [facility] 05/11/2019, 6:14 PM [facility] [place] Office: [phone]

## 2019-05-10 ENCOUNTER — Encounter: Payer: Self-pay | Admitting: Cardiology

## 2019-05-10 ENCOUNTER — Ambulatory Visit
Admission: RE | Admit: 2019-05-10 | Discharge: 2019-05-10 | Disposition: A | Discharge status: Home / Self Care | Payer: Medicare Other | Source: Ambulatory Visit | Attending: Cardiology | Admitting: Cardiology

## 2019-05-10 ENCOUNTER — Ambulatory Visit: Payer: Medicare Other | Admitting: Cardiology

## 2019-05-10 ENCOUNTER — Other Ambulatory Visit: Payer: Self-pay

## 2019-05-10 VITALS — BP 136/44 | HR 65 | Temp 96.7°F | Resp 17 | Ht 60.0 in | Wt 164.0 lb

## 2019-05-10 DIAGNOSIS — R0609 Other forms of dyspnea: Secondary | ICD-10-CM

## 2019-05-10 DIAGNOSIS — I2510 Atherosclerotic heart disease of native coronary artery without angina pectoris: Secondary | ICD-10-CM | POA: Diagnosis not present

## 2019-05-10 DIAGNOSIS — I5033 Acute on chronic diastolic (congestive) heart failure: Secondary | ICD-10-CM

## 2019-05-10 DIAGNOSIS — I6523 Occlusion and stenosis of bilateral carotid arteries: Secondary | ICD-10-CM

## 2019-05-10 DIAGNOSIS — R0600 Dyspnea, unspecified: Secondary | ICD-10-CM | POA: Diagnosis not present

## 2019-05-10 DIAGNOSIS — I480 Paroxysmal atrial fibrillation: Secondary | ICD-10-CM | POA: Diagnosis not present

## 2019-05-10 DIAGNOSIS — I951 Orthostatic hypotension: Secondary | ICD-10-CM

## 2019-05-10 DIAGNOSIS — R0602 Shortness of breath: Secondary | ICD-10-CM | POA: Diagnosis not present

## 2019-05-10 MED ORDER — ISOSORBIDE DINITRATE 30 MG PO TABS: 30.0000 mg | ORAL_TABLET | Freq: Three times a day (TID) | ORAL | 2 refills | Status: DC

## 2019-05-10 MED ORDER — FUROSEMIDE 20 MG PO TABS: 20.0000 mg | ORAL_TABLET | Freq: Every day | ORAL | 1 refills | Status: DC

## 2019-05-11 LAB — BASIC METABOLIC PANEL
BUN/Creatinine Ratio: 12 (ref 12–28)
BUN: 13 mg/dL (ref 8–27)
CO2: 27 mmol/L (ref 20–29)
Calcium: 8.5 mg/dL — ABNORMAL LOW (ref 8.7–10.3)
Chloride: 99 mmol/L (ref 96–106)
Creatinine, Ser: 1.1 mg/dL — ABNORMAL HIGH (ref 0.57–1.00)
GFR calc Af Amer: 53 mL/min/{1.73_m2} — ABNORMAL LOW (ref >59)
GFR calc non Af Amer: 46 mL/min/{1.73_m2} — ABNORMAL LOW (ref >59)
Glucose: 168 mg/dL — ABNORMAL HIGH (ref 65–99)
Potassium: 3.4 mmol/L — ABNORMAL LOW (ref 3.5–5.2)
Sodium: 140 mmol/L (ref 134–144)

## 2019-05-11 LAB — BRAIN NATRIURETIC PEPTIDE: BNP: 323 pg/mL — ABNORMAL HIGH (ref 0.0–100.0)

## 2019-05-11 MED ORDER — POTASSIUM CHLORIDE CRYS ER 20 MEQ PO TBCR: 20.0000 meq | EXTENDED_RELEASE_TABLET | Freq: Every day | ORAL | 2 refills | Status: DC | PRN

## 2019-05-11 NOTE — Addendum Note (Signed)
Addended by: Kela Millin on: 05/11/2019 06:14 PM   Modules accepted: Orders

## 2019-05-13 ENCOUNTER — Ambulatory Visit: Payer: Medicare Other | Admitting: Cardiology

## 2019-05-15 ENCOUNTER — Ambulatory Visit: Payer: Medicare Other

## 2019-05-15 ENCOUNTER — Other Ambulatory Visit: Payer: Self-pay

## 2019-05-15 DIAGNOSIS — I5033 Acute on chronic diastolic (congestive) heart failure: Secondary | ICD-10-CM | POA: Diagnosis not present

## 2019-06-03 NOTE — Progress Notes (Signed)
Primary Physician/Referring:  Shon Baton, MD  Patient ID: Linda Oliver, female    DOB: 1932-08-01, 86 y.o.   MRN: [id number]  Chief Complaint  Patient presents with  . Follow-up    3 week  . Congestive Heart Failure  . Shortness of Breath   HPI:    Linda Oliver  is a 86 y.o. Caucasian female patient with CAD s/p CABG x 3 in 1996 in [place], HTN, hyperlipidemia, diabetes with PVD and sleep apnea on CPAP and now compliant, carotid stenosis, and history of  autonomic orthostatic hypotension, medial left occipital infarct in embolic pattern by MRI in 2020 and loop recorder implanted showed paroxysmal atrial fibrillation, for which she is now on Eliquis.  I have seen the patient 3 to 4 weeks ago with acute decompensated diastolic heart failure, I had performed repeat stress testing and echocardiogram and added isosorbide dinitrate and also furosemide along with potassium.  Symptoms of dyspnea, cough has completely resolved.   Tolerating anticoagulation without bleeding diathesis, states that blood pressure has been controlled, no dizziness or syncope.   Past Medical History:  Diagnosis Date  . Anginal pain ([place])    occ  . Anxiety   . Broken ankle   . Bullous pemphigus   . CAD (coronary artery disease)    [place]   80% stenosis ostial Left main, 30% stenosis proximal LAD, 80% stenosis proximal RCA CABG  -  LIMA to LAD, SVG to int, SVG to [place], [place]   . Depression   . Diabetic peripheral neuropathy ([place])   . Encounter for loop recorder check 04/11/2019  . GERD (gastroesophageal reflux disease)   . H/O: stroke: Left occipital stroke and vision loss 08/24/2018  . Headache(784.0)    hx migraines  . Heart murmur    mvp  . Hyperlipidemia   . Hypertension   . Hypertensive heart disease   . Loop recorder Reveal 08/24/2018 04/11/2019   Scheduled Remote loop recorder check 03/10/2019: The presenting rhythm is sinus bradycardia. There were 0 symptomatic patient  activations recorded. There were 275 AF episodes and 0 AT episodes recorded, representing a 4.2 % cumulative AT/AF burden. The longest episode lasted 00:03:40:00 in duration. Some false with PAC. There were 0 tachy episodes detected. There were 0 pause episodes detected. Th  . Nasal bleeding    occ due to O2  . OA (osteoarthritis)   . Obesity (BMI 30-39.9)   . Obstructive sleep apnea on CPAP   . Osteopenia   . Paroxysmal atrial fibrillation ([place]) 04/17/2019  . Peripheral neuropathy   . Seizure ([place])    2009  . Sleep apnea    dx 61yrs ago doesnt use cpap but uses O2 at nite 2L  . Stones in the urinary tract   . Type 2 diabetes mellitus with vascular disease ([place])   . UTI (lower urinary tract infection)    Past Surgical History:  Procedure Laterality Date  . ABDOMINAL HYSTERECTOMY  70's  . CARDIAC CATHETERIZATION    . CARPAL TUNNEL RELEASE Bilateral   . CATARACT EXTRACTION W/PHACO Bilateral   . CHOLECYSTECTOMY    . CORONARY ARTERY BYPASS GRAFT  96  . LOOP RECORDER INSERTION N/A 08/24/2018   Procedure: LOOP RECORDER INSERTION;  Surgeon: Constance Haw, MD;  Location: [facility];  Service: Cardiovascular;  Laterality: N/A;  . SHOULDER ARTHROSCOPY     rt  . SHOULDER ARTHROSCOPY WITH ROTATOR CUFF REPAIR AND SUBACROMIAL DECOMPRESSION  12/01/2011   Procedure: SHOULDER ARTHROSCOPY WITH ROTATOR CUFF REPAIR AND SUBACROMIAL DECOMPRESSION;  Surgeon: Nita Sells, MD;  Location: [facility];  Service: Orthopedics;  Laterality: Left;  shoulder debridement calcific tendonitis   Family History  Problem Relation Age of Onset  . Coronary artery disease Mother   . Heart disease Father   . Cancer Father        skin  . Cancer Brother   . Cancer - Colon Brother        brain, Lymphoma    Social History   Tobacco Use  . Smoking status: Former Smoker    Packs/day: 1.00    Years: 30.00    Pack years: 30.00    Types: Cigarettes    Quit date: 11/25/1986    Years since quitting:  32.5  . Smokeless tobacco: Never Used  Substance Use Topics  . Alcohol use: No   Marital Status: Widowed  ROS  Review of Systems  Cardiovascular: Positive for dyspnea on exertion (improved) and irregular heartbeat (Chronic). Negative for chest pain and claudication.  Respiratory: Positive for snoring (on CPAP). Negative for hemoptysis and wheezing.   Gastrointestinal: Negative for melena.  Genitourinary: Positive for bladder incontinence (chronic).  Neurological: Positive for dizziness (chronic and stable).   Objective  Blood pressure (!) 128/47, pulse 62, temperature 97.8 F (36.6 C), temperature source Oral, resp. rate 15, height 5' (1.524 m), weight 167 lb (75.8 kg), SpO2 97 %.  Vitals with BMI 06/05/2019 05/10/2019 03/11/2019  Height 5\' 0"  5\' 0"  5\' 0"   Weight 167 lbs 164 lbs 168 lbs  BMI 32.62 52.77 82.42  Systolic 353 614 -  Diastolic 47 44 -  Pulse 62 65 -     Physical Exam  Constitutional: She appears well-developed and well-nourished.  Neck: No JVD present.  Carotid bruit is present.  Cardiovascular: Normal rate, regular rhythm and intact distal pulses. Exam reveals no gallop.  No murmur heard. Pulmonary/Chest: Effort normal and breath sounds normal. No accessory muscle usage. No respiratory distress.  Abdominal: Soft. Bowel sounds are normal.   Laboratory examination:   Recent Labs    08/23/18 0434 10/04/18 1046 05/10/19 1442  NA 142 137 140  K 3.7 4.2 3.4*  CL 105 101 99  CO2 28 24 27   GLUCOSE 150* 336* 168*  BUN 15 17 13   CREATININE 0.90 1.27* 1.10*  CALCIUM 8.5* 8.7* 8.5*  GFRNONAA 59* 39* 46*  GFRAA >60 45* 53*   CrCl cannot be calculated (Patient's most recent lab result is older than the maximum 21 days allowed.).  CMP Latest Ref Rng & Units 05/10/2019 10/04/2018 08/23/2018  Glucose 65 - 99 mg/dL 168(H) 336(H) 150(H)  BUN 8 - 27 mg/dL 13 17 15   Creatinine 0.57 - 1.00 mg/dL 1.10(H) 1.27(H) 0.90  Sodium 134 - 144 mmol/L 140 137 142  Potassium 3.5 - 5.2  mmol/L 3.4(L) 4.2 3.7  Chloride 96 - 106 mmol/L 99 101 105  CO2 20 - 29 mmol/L 27 24 28   Calcium 8.7 - 10.3 mg/dL 8.5(L) 8.7(L) 8.5(L)  Total Protein 6.5 - 8.1 g/dL - - 6.0(L)  Total Bilirubin 0.3 - 1.2 mg/dL - - 0.4  Alkaline Phos 38 - 126 U/L - - 100  AST 15 - 41 U/L - - 15  ALT 0 - 44 U/L - - 12   CBC Latest Ref Rng & Units 10/04/2018 08/23/2018 08/22/2018  WBC 4.0 - 10.5 K/uL 9.3 8.6 9.6  Hemoglobin 12.0 - 15.0 g/dL 11.2(L) 11.1(L) 11.7(L)  Hematocrit 36.0 - 46.0 % 37.5 34.7(L) 36.7  Platelets 150 - 400 K/uL 279 240 258   Lipid Panel     Component Value Date/Time   CHOL 127 08/23/2018 0434   TRIG 358 (H) 08/23/2018 0434   HDL 27 (L) 08/23/2018 0434   CHOLHDL 4.7 08/23/2018 0434   VLDL 72 (H) 08/23/2018 0434   LDLCALC 28 08/23/2018 0434   HEMOGLOBIN A1C Lab Results  Component Value Date   HGBA1C 8.7 (H) 08/23/2018   MPG 202.99 08/23/2018   TSH No results for input(s): TSH in the last 8760 hours.   BNP (last 3 results) Recent Labs    05/10/19 1442  BNP 323.0*   External labs:  Cholesterol, total 127.000 08/23/2018 HDL 27.000 08/23/2018 LDL 28.000 08/23/2018 Triglycerides 358.000 08/23/2018  A1C 8.400 % 03/29/2019 TSH 1.940 04/01/2019  Hemoglobin 11.200 10/04/2018 INR 1.100 08/22/2018 Platelets 279.000 10/04/2018  Creatinine, Serum 1.000 mg/ 04/01/2019 Potassium 4.200 10/04/2018 Magnesium N/D ALT (SGPT) 12.000 uni 04/01/2019  Medications and allergies   Allergies  Allergen Reactions  . Percocet [Oxycodone-Acetaminophen] Nausea And Vomiting  . Latex Itching and Rash    EKG leads     Current Outpatient Medications  Medication Instructions  . acetaminophen (TYLENOL) 500 mg, Oral, Every 4 hours PRN  . apixaban (ELIQUIS) 5 mg, Oral, 2 times daily  . B-D UF III MINI PEN NEEDLES 31G X 5 MM MISC SMARTSIG:1 Each SUB-Q Daily  . busPIRone (BUSPAR) 7.5 mg, Oral, 2 times daily  . DULoxetine (CYMBALTA) 60 mg, Oral, Daily  . fesoterodine (TOVIAZ) 8 mg, Oral, Every  evening  . fluticasone (FLONASE) 50 MCG/ACT nasal spray 1 spray, Each Nare, 2 times daily PRN  . FREESTYLE LITE test strip No dose, route, or frequency recorded.  . furosemide (LASIX) 20 mg, Oral, Daily PRN  . gabapentin (NEURONTIN) 300 mg, Oral, 2 times daily  . glimepiride (AMARYL) 1 mg, Oral, Daily  . hydrOXYzine (ATARAX/VISTARIL) 50 mg, Oral, Every evening  . isosorbide dinitrate (ISORDIL) 30 mg, Oral, 3 times daily  . Lantus SoloStar 40 Units, Subcutaneous, Daily before breakfast  . metoprolol succinate (TOPROL-XL) 12.5 mg, Oral, 2 times daily  . NAMZARIC 28-10 MG CP24 1 capsule, Oral, Daily  . nitroGLYCERIN (NITROSTAT) 0.4 mg, Sublingual, Every 5 min PRN  . pantoprazole (PROTONIX) 40 mg, Oral, Every evening  . potassium chloride SA (KLOR-CON) 20 MEQ tablet 20 mEq, Oral, Daily PRN, Take two tablets on picking up medication  . rosuvastatin (CRESTOR) 10 mg, Oral, Daily before supper  . sitaGLIPtin (JANUVIA) 100 mg, Oral, Daily  . valsartan (DIOVAN) 160 mg, Oral, Daily   Radiology:   CT Angio Neck 08/23/2018: 1. Left subclavian origin high-grade stenosis with delayed enhancement of the distal left vertebral artery. Additionally, there is irregular plaque/mural thrombus involving the aortic isthmus and left subclavian origin. 2. Left PCA branch occlusion correlating with the known acute infarct. 3. Cervical carotid atherosclerosis with up to 50% stenosis at the left ICA bulb. 4. 3 cm right thyroid mass, recommend elective ultrasound.  CXR PA/Lat 05/10/2019: The heart size and mediastinal contours are within normal limits. Prior CABG again noted. Aortic atherosclerosis incidentally noted. Cardiac loop recorder is seen anterior chest wall. Both lungs are clear. The visualized skeletal structures are unremarkable.  Cardiac Studies:   Coronary angiogram 1996: [place]   80% stenosis ostial Left main, 30% stenosis proximal LAD, 80% stenosis proximal RCA CABG  -  LIMA to LAD, SVG to  int, SVG to [place], [place]  Lexiscan myoview stress test 10/24/2016: 1. Resting EKG shows T inversion in high lateral leads. There was <1 mm ST depression and T inversion in inferior and lateral leads, reverted to baseline immediately into recovery. Equivocal ischemic change due to pharmacologic stress with lexiscan. Symptoms included dyspnea and dizziness.  2. Very subtle basal inferior small sized reversible defect suggestive of ischemia noted. Normal LVEF without wall motion abnormality at 72%. Low risk study.  Echocardiogram 03/22/2019:  1. Normal LV systolic function with visual EF 55-60%. Left ventricle cavity is normal in size. Moderate left ventricular hypertrophy. Normal global wall motion. No obvious regional wall motion abnormalities. Doppler evidence of grade II diastolic dysfunction, elevated LAP. Calculated EF 59%. 2. Left atrial cavity is mildly dilated. 3. Grossly, right atrial cavity is mildly dilated. 4. Moderate aortic regurgitation. 5. Mild to moderate mitral regurgitation. 6. Mild to moderate tricuspid regurgitation. Moderate pulmonary hypertension. RVSP measures 59 mmHg. 7. Mild pulmonic regurgitation. 8. Prior study dated 08/22/2017, LVEF of 60-65%, mild to moderate AR. Otherwise no significant change.   Carotid artery duplex 03/22/2019:  Stenosis in the right internal carotid artery (50-69%). Stenosis in the right external carotid artery (<50%).  Stenosis in the left internal carotid artery (16-49%). Stenosis in the left external carotid artery (<50%)  Antegrade right vertebral artery flow. Antegrade left vertebral artery flow.  Compared to the study done on 10/152020, no significant change. Follow up in six months is appropriate if clinically indicated.  Lexiscan Tetrofosmin Stress Test  05/15/2019: Nondiagnostic ECG stress. Myocardial perfusion is abnormal. Mild degree large extent perfusion defect consistent with moderate ischemia located in the apical  inferior wall, mid inferior wall, mid inferoseptal wall, basal inferior wall and basal inferoseptal wall (Right Coronary Artery region) of left ventricle. Gated SPECT imaging of the left ventricle was normal,  demonstrating hypokinesis of the apical inferior wall, mid inferior wall and basal inferior wall.  Stress LV EF is normal 55%.  Compared to the study done on 10/24/2016, previously minimal ischemia was evident and LVEF was 72%.  Low risk.  EKG    03/11/2019: Normal sinus rhythm with PAC's at 67 bpm, normal axis, IRBBB, lateral ST depression, cannot exclude ischemia, unchanged from EKG in June 2019.  Assessment     ICD-10-CM   1. Chronic diastolic (congestive) heart failure (HCC)  I50.32 furosemide (LASIX) 20 MG tablet    potassium chloride SA (KLOR-CON) 20 MEQ tablet  2. Primary hypertension  I10 isosorbide dinitrate (ISORDIL) 30 MG tablet  3. Paroxysmal atrial fibrillation (HCC) CHA2DS2-VASCScore: Risk Score 8,  Yearly risk of stroke 6.7%.   I48.0   4. Coronary artery disease of native artery of native heart with stable angina pectoris (HCC)  I25.118 nitroGLYCERIN (NITROSTAT) 0.4 MG SL tablet    isosorbide dinitrate (ISORDIL) 30 MG tablet  5. Dyspnea on exertion  R06.00   6. Moderate tricuspid regurgitation  I07.1      Meds ordered this encounter  Medications  . nitroGLYCERIN (NITROSTAT) 0.4 MG SL tablet    Sig: Place 1 tablet (0.4 mg total) under the tongue every 5 (five) minutes as needed for up to 25 days for chest pain.    Dispense:  25 tablet    Refill:  3  . isosorbide dinitrate (ISORDIL) 30 MG tablet    Sig: Take 1 tablet (30 mg total) by mouth 3 (three) times daily.    Dispense:  270 tablet    Refill:  3  . furosemide (LASIX) 20 MG tablet  Sig: Take 1 tablet (20 mg total) by mouth daily as needed (Dyspnea and cough).    Dispense:  90 tablet    Refill:  2  . potassium chloride SA (KLOR-CON) 20 MEQ tablet    Sig: Take 1 tablet (20 mEq total) by mouth daily as needed  (With furosemide). Take two tablets on picking up medication    Dispense:  30 tablet    Refill:  2    Medications Discontinued During This Encounter  Medication Reason  . isosorbide dinitrate (ISORDIL) 30 MG tablet Reorder  . furosemide (LASIX) 20 MG tablet Reorder  . potassium chloride SA (KLOR-CON) 20 MEQ tablet Reorder     Recommendations:   Linda Oliver  is a 86 y.o. Caucasian female patient with CAD s/p CABG x 3 in 1996 in [place], HTN, hyperlipidemia, diabetes with PVD and sleep apnea on CPAP and now compliant, carotid stenosis, and history of  autonomic orthostatic hypotension, medial left occipital infarct in embolic pattern by MRI in 2020 and loop recorder implanted showed paroxysmal atrial fibrillation, for which she is now on Eliquis.    I have seen the patient 3 to 4 weeks ago with acute decompensated diastolic heart failure, I had performed repeat stress testing and echocardiogram and added isosorbide dinitrate and also furosemide along with potassium.  Symptoms of dyspnea, cough has completely resolved.  She does have moderate pulmonary hypertension to moderately severe pulmonary hypertension with moderate tricuspid regurgitation.  Today there is no clinical evidence heart failure.  Suspect her pulmonary hypertension is secondary to diastolic dysfunction and diastolic heart failure.  I discussed with him regarding changing the furosemide and potassium to as needed depending upon her weight and symptoms of cough and dyspnea.  She does have mildly abnormal nuclear stress test in the form of inferior ischemia but this is low risk overall and I would like to manage her medically in view of her advanced age and as her symptoms improved.  Blood pressure is also well controlled.  With regard to coronary artery disease, I have refilled her nitroglycerin and discussed symptoms of angina pectoris.  I will see her back in 6 months. Daughter is present.   Adrian Prows, MD, [facility] 06/09/2019, 9:48  [facility] Pager: [phone] Office: [phone]

## 2019-06-05 ENCOUNTER — Other Ambulatory Visit: Payer: Self-pay

## 2019-06-05 ENCOUNTER — Ambulatory Visit: Payer: Medicare Other | Admitting: Cardiology

## 2019-06-05 ENCOUNTER — Encounter: Payer: Self-pay | Admitting: Cardiology

## 2019-06-05 VITALS — BP 128/47 | HR 62 | Temp 97.8°F | Resp 15 | Ht 60.0 in | Wt 167.0 lb

## 2019-06-05 DIAGNOSIS — I071 Rheumatic tricuspid insufficiency: Secondary | ICD-10-CM

## 2019-06-05 DIAGNOSIS — I25118 Atherosclerotic heart disease of native coronary artery with other forms of angina pectoris: Secondary | ICD-10-CM | POA: Diagnosis not present

## 2019-06-05 DIAGNOSIS — I5032 Chronic diastolic (congestive) heart failure: Secondary | ICD-10-CM

## 2019-06-05 DIAGNOSIS — R0609 Other forms of dyspnea: Secondary | ICD-10-CM | POA: Diagnosis not present

## 2019-06-05 DIAGNOSIS — I10 Essential (primary) hypertension: Secondary | ICD-10-CM

## 2019-06-05 DIAGNOSIS — I480 Paroxysmal atrial fibrillation: Secondary | ICD-10-CM | POA: Diagnosis not present

## 2019-06-05 DIAGNOSIS — I6523 Occlusion and stenosis of bilateral carotid arteries: Secondary | ICD-10-CM | POA: Diagnosis not present

## 2019-06-05 MED ORDER — FUROSEMIDE 20 MG PO TABS: 20.0000 mg | ORAL_TABLET | Freq: Every day | ORAL | 2 refills | Status: DC | PRN

## 2019-06-05 MED ORDER — ISOSORBIDE DINITRATE 30 MG PO TABS: 30.0000 mg | ORAL_TABLET | Freq: Three times a day (TID) | ORAL | 3 refills | Status: DC

## 2019-06-05 MED ORDER — NITROGLYCERIN 0.4 MG SL SUBL: 0.4000 mg | SUBLINGUAL_TABLET | SUBLINGUAL | 3 refills | Status: DC | PRN

## 2019-06-05 MED ORDER — POTASSIUM CHLORIDE CRYS ER 20 MEQ PO TBCR: 20.0000 meq | EXTENDED_RELEASE_TABLET | Freq: Every day | ORAL | 2 refills | Status: DC | PRN

## 2019-06-07 DIAGNOSIS — I480 Paroxysmal atrial fibrillation: Secondary | ICD-10-CM | POA: Diagnosis not present

## 2019-06-07 DIAGNOSIS — Z95818 Presence of other cardiac implants and grafts: Secondary | ICD-10-CM | POA: Diagnosis not present

## 2019-06-07 DIAGNOSIS — Z4509 Encounter for adjustment and management of other cardiac device: Secondary | ICD-10-CM | POA: Diagnosis not present

## 2019-06-07 DIAGNOSIS — I6340 Cerebral infarction due to embolism of unspecified cerebral artery: Secondary | ICD-10-CM | POA: Diagnosis not present

## 2019-06-19 ENCOUNTER — Encounter (INDEPENDENT_AMBULATORY_CARE_PROVIDER_SITE_OTHER): Payer: Medicare Other | Admitting: Ophthalmology

## 2019-07-08 DIAGNOSIS — Z4509 Encounter for adjustment and management of other cardiac device: Secondary | ICD-10-CM | POA: Diagnosis not present

## 2019-07-08 DIAGNOSIS — I480 Paroxysmal atrial fibrillation: Secondary | ICD-10-CM | POA: Diagnosis not present

## 2019-07-08 DIAGNOSIS — Z95818 Presence of other cardiac implants and grafts: Secondary | ICD-10-CM | POA: Diagnosis not present

## 2019-07-08 DIAGNOSIS — I6340 Cerebral infarction due to embolism of unspecified cerebral artery: Secondary | ICD-10-CM | POA: Diagnosis not present

## 2019-07-30 ENCOUNTER — Encounter (INDEPENDENT_AMBULATORY_CARE_PROVIDER_SITE_OTHER): Payer: Medicare Other | Admitting: Ophthalmology

## 2019-08-01 DIAGNOSIS — Z961 Presence of intraocular lens: Secondary | ICD-10-CM | POA: Diagnosis not present

## 2019-08-01 DIAGNOSIS — Z9889 Other specified postprocedural states: Secondary | ICD-10-CM | POA: Diagnosis not present

## 2019-08-01 DIAGNOSIS — E119 Type 2 diabetes mellitus without complications: Secondary | ICD-10-CM | POA: Diagnosis not present

## 2019-08-01 DIAGNOSIS — H35373 Puckering of macula, bilateral: Secondary | ICD-10-CM | POA: Diagnosis not present

## 2019-08-01 DIAGNOSIS — H04123 Dry eye syndrome of bilateral lacrimal glands: Secondary | ICD-10-CM | POA: Diagnosis not present

## 2019-08-01 DIAGNOSIS — H53461 Homonymous bilateral field defects, right side: Secondary | ICD-10-CM | POA: Diagnosis not present

## 2019-08-01 LAB — HM DIABETES EYE EXAM

## 2019-08-02 DIAGNOSIS — Z794 Long term (current) use of insulin: Secondary | ICD-10-CM | POA: Diagnosis not present

## 2019-08-02 DIAGNOSIS — R0609 Other forms of dyspnea: Secondary | ICD-10-CM | POA: Diagnosis not present

## 2019-08-02 DIAGNOSIS — E669 Obesity, unspecified: Secondary | ICD-10-CM | POA: Diagnosis not present

## 2019-08-02 DIAGNOSIS — I4891 Unspecified atrial fibrillation: Secondary | ICD-10-CM | POA: Diagnosis not present

## 2019-08-02 DIAGNOSIS — N1831 Chronic kidney disease, stage 3a: Secondary | ICD-10-CM | POA: Diagnosis not present

## 2019-08-02 DIAGNOSIS — R413 Other amnesia: Secondary | ICD-10-CM | POA: Diagnosis not present

## 2019-08-02 DIAGNOSIS — R627 Adult failure to thrive: Secondary | ICD-10-CM | POA: Diagnosis not present

## 2019-08-02 DIAGNOSIS — R040 Epistaxis: Secondary | ICD-10-CM | POA: Diagnosis not present

## 2019-08-02 DIAGNOSIS — E1149 Type 2 diabetes mellitus with other diabetic neurological complication: Secondary | ICD-10-CM | POA: Diagnosis not present

## 2019-08-02 DIAGNOSIS — I119 Hypertensive heart disease without heart failure: Secondary | ICD-10-CM | POA: Diagnosis not present

## 2019-08-06 ENCOUNTER — Other Ambulatory Visit: Payer: Self-pay

## 2019-08-06 ENCOUNTER — Ambulatory Visit (INDEPENDENT_AMBULATORY_CARE_PROVIDER_SITE_OTHER): Payer: Medicare Other | Admitting: Ophthalmology

## 2019-08-06 ENCOUNTER — Encounter (INDEPENDENT_AMBULATORY_CARE_PROVIDER_SITE_OTHER): Payer: Self-pay | Admitting: Ophthalmology

## 2019-08-06 ENCOUNTER — Other Ambulatory Visit: Payer: Self-pay | Admitting: Neurology

## 2019-08-06 DIAGNOSIS — H43813 Vitreous degeneration, bilateral: Secondary | ICD-10-CM | POA: Diagnosis not present

## 2019-08-06 DIAGNOSIS — H4312 Vitreous hemorrhage, left eye: Secondary | ICD-10-CM

## 2019-08-06 DIAGNOSIS — H35372 Puckering of macula, left eye: Secondary | ICD-10-CM

## 2019-08-06 DIAGNOSIS — I6523 Occlusion and stenosis of bilateral carotid arteries: Secondary | ICD-10-CM

## 2019-08-06 DIAGNOSIS — H43811 Vitreous degeneration, right eye: Secondary | ICD-10-CM

## 2019-08-06 DIAGNOSIS — E1159 Type 2 diabetes mellitus with other circulatory complications: Secondary | ICD-10-CM

## 2019-08-06 DIAGNOSIS — H43812 Vitreous degeneration, left eye: Secondary | ICD-10-CM

## 2019-08-06 NOTE — Assessment & Plan Note (Signed)

## 2019-08-06 NOTE — Assessment & Plan Note (Signed)
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

## 2019-08-06 NOTE — Progress Notes (Signed)
08/06/2019     CHIEF COMPLAINT Patient presents for Retina Follow Up   HISTORY OF PRESENT ILLNESS: Linda Oliver is a 86 y.o. female who presents to the clinic today for:   HPI    Retina Follow Up    Patient presents with  Other.  In both eyes.  This started 3 months ago.  Severity is mild.  Duration of 3 months.  Since onset it is stable.  I, the attending physician,  performed the HPI with the patient and updated documentation appropriately.          Comments    3 Month F/U OU  Pt denies noticeable changes to [place] OU since last visit. Pt denies ocular pain, flashes of light, or floaters OU.  LBS: 95 this AM "has been low"       Last edited by Hurman Horn, MD on 08/06/2019  4:09 PM. (History)      Referring physician: Shon Baton, MD [place],  [place] [address]  HISTORICAL INFORMATION:   Selected notes from the [facility]    Lab Results  Component Value Date   HGBA1C 8.7 (H) 08/23/2018     CURRENT MEDICATIONS: No current outpatient medications on file. (Ophthalmic Drugs)   No current facility-administered medications for this visit. (Ophthalmic Drugs)   Current Outpatient Medications (Other)  Medication Sig  . acetaminophen (TYLENOL) 500 MG tablet Take 500 mg by mouth every 4 (four) hours as needed for moderate pain.  Marland Kitchen apixaban (ELIQUIS) 5 MG TABS tablet Take 1 tablet (5 mg total) by mouth 2 (two) times daily.  . B-D UF III MINI PEN NEEDLES 31G X 5 MM MISC SMARTSIG:1 Each SUB-Q Daily  . busPIRone (BUSPAR) 7.5 MG tablet Take 7.5 mg by mouth 2 (two) times daily.  . DULoxetine (CYMBALTA) 60 MG capsule Take 60 mg by mouth daily.  . fesoterodine (TOVIAZ) 8 MG TB24 tablet Take 8 mg by mouth every evening.   . fluticasone (FLONASE) 50 MCG/ACT nasal spray Place 1 spray into both nostrils 2 (two) times daily as needed for allergies or rhinitis.  Marland Kitchen FREESTYLE LITE test strip   . furosemide (LASIX) 20 MG tablet Take 1 tablet (20 mg total) by mouth  daily as needed (Dyspnea and cough).  . gabapentin (NEURONTIN) 300 MG capsule Take 300 mg by mouth 2 (two) times daily.  Marland Kitchen glimepiride (AMARYL) 1 MG tablet Take 1 mg by mouth daily.  . hydrOXYzine (ATARAX/VISTARIL) 25 MG tablet Take 50 mg by mouth every evening.   . Insulin Glargine (LANTUS SOLOSTAR) 100 UNIT/ML Solostar Pen Inject 40 Units into the skin daily before breakfast.   . isosorbide dinitrate (ISORDIL) 30 MG tablet Take 1 tablet (30 mg total) by mouth 3 (three) times daily.  . metoprolol succinate (TOPROL-XL) 25 MG 24 hr tablet Take 12.5 mg by mouth 2 (two) times daily.  Marland Kitchen NAMZARIC 28-10 MG CP24 TAKE 1 CAPSULE DAILY  . nitroGLYCERIN (NITROSTAT) 0.4 MG SL tablet Place 1 tablet (0.4 mg total) under the tongue every 5 (five) minutes as needed for up to 25 days for chest pain.  . pantoprazole (PROTONIX) 40 MG tablet Take 40 mg by mouth every evening.   . potassium chloride SA (KLOR-CON) 20 MEQ tablet Take 1 tablet (20 mEq total) by mouth daily as needed (With furosemide). Take two tablets on picking up medication  . rosuvastatin (CRESTOR) 10 MG tablet Take 10 mg by mouth daily before supper.  . sitaGLIPtin (JANUVIA)  100 MG tablet Take 100 mg by mouth daily.  . valsartan (DIOVAN) 160 MG tablet Take 1 tablet (160 mg total) by mouth daily.   No current facility-administered medications for this visit. (Other)      REVIEW OF SYSTEMS:    ALLERGIES Allergies  Allergen Reactions  . Percocet [Oxycodone-Acetaminophen] Nausea And Vomiting  . Latex Itching and Rash    EKG leads    PAST MEDICAL HISTORY Past Medical History:  Diagnosis Date  . Anginal pain ([place])    occ  . Anxiety   . Broken ankle   . Bullous pemphigus   . CAD (coronary artery disease)    [place]   80% stenosis ostial Left main, 30% stenosis proximal LAD, 80% stenosis proximal RCA CABG  -  LIMA to LAD, SVG to int, SVG to [place], [place]   . Depression   . Diabetic peripheral neuropathy ([place])    . Encounter for loop recorder check 04/11/2019  . GERD (gastroesophageal reflux disease)   . H/O: stroke: Left occipital stroke and vision loss 08/24/2018  . Headache(784.0)    hx migraines  . Heart murmur    mvp  . Hyperlipidemia   . Hypertension   . Hypertensive heart disease   . Loop recorder Reveal 08/24/2018 04/11/2019   Scheduled Remote loop recorder check 03/10/2019: The presenting rhythm is sinus bradycardia. There were 0 symptomatic patient activations recorded. There were 275 AF episodes and 0 AT episodes recorded, representing a 4.2 % cumulative AT/AF burden. The longest episode lasted 00:03:40:00 in duration. Some false with PAC. There were 0 tachy episodes detected. There were 0 pause episodes detected. Th  . Nasal bleeding    occ due to O2  . OA (osteoarthritis)   . Obesity (BMI 30-39.9)   . Obstructive sleep apnea on CPAP   . Osteopenia   . Paroxysmal atrial fibrillation ([place]) 04/17/2019  . Peripheral neuropathy   . Seizure ([place])    2009  . Sleep apnea    dx 75yrs ago doesnt use cpap but uses O2 at nite 2L  . Stones in the urinary tract   . Type 2 diabetes mellitus with vascular disease ([place])   . UTI (lower urinary tract infection)    Past Surgical History:  Procedure Laterality Date  . ABDOMINAL HYSTERECTOMY  70's  . CARDIAC CATHETERIZATION    . CARPAL TUNNEL RELEASE Bilateral   . CATARACT EXTRACTION W/PHACO Bilateral   . CHOLECYSTECTOMY    . CORONARY ARTERY BYPASS GRAFT  96  . LOOP RECORDER INSERTION N/A 08/24/2018   Procedure: LOOP RECORDER INSERTION;  Surgeon: Constance Haw, MD;  Location: [facility];  Service: Cardiovascular;  Laterality: N/A;  . SHOULDER ARTHROSCOPY     rt  . SHOULDER ARTHROSCOPY WITH ROTATOR CUFF REPAIR AND SUBACROMIAL DECOMPRESSION  12/01/2011   Procedure: SHOULDER ARTHROSCOPY WITH ROTATOR CUFF REPAIR AND SUBACROMIAL DECOMPRESSION;  Surgeon: Nita Sells, MD;  Location: [facility];  Service: Orthopedics;  Laterality: Left;   shoulder debridement calcific tendonitis    FAMILY HISTORY Family History  Problem Relation Age of Onset  . Coronary artery disease Mother   . Heart disease Father   . Cancer Father        skin  . Cancer Brother   . Cancer - Colon Brother        brain, Lymphoma    SOCIAL HISTORY Social History   Tobacco Use  . Smoking status: Former Smoker  Packs/day: 1.00    Years: 30.00    Pack years: 30.00    Types: Cigarettes    Quit date: 11/25/1986    Years since quitting: 32.7  . Smokeless tobacco: Never Used  Vaping Use  . Vaping Use: Never used  Substance Use Topics  . Alcohol use: No  . Drug use: No         OPHTHALMIC EXAM:  Base Eye Exam    Visual Acuity (ETDRS)      Right Left   Dist cc 20/30 +2 20/70 +1   Dist ph cc NI 20/70 +2   Correction: Glasses       Tonometry (Tonopen, 3:06 PM)      Right Left   Pressure 16 17       Pupils      Pupils Dark Light Shape React APD   Right PERRL 5 4 Round Brisk None   Left PERRL 5 4 Round Brisk None       Visual Fields (Counting fingers)      Left Right    Full Full       Extraocular Movement      Right Left    Full Full       Neuro/Psych    Oriented x3: Yes   Mood/Affect: Normal       Dilation    Both eyes: 1.0% Mydriacyl, 2.5% Phenylephrine @ 3:06 PM        Slit Lamp and Fundus Exam    External Exam      Right Left   External Normal Normal       Slit Lamp Exam      Right Left   Lids/Lashes Normal Normal   Conjunctiva/Sclera White and quiet White and quiet   Cornea Clear Clear   Anterior Chamber Deep and quiet Deep and quiet   Iris Round and reactive Round and reactive   Lens Posterior chamber intraocular lens Posterior chamber intraocular lens,, with heme in the cul de sac of capsule   Anterior Vitreous Normal Normal       Fundus Exam      Right Left   Posterior Vitreous Posterior vitreous detachment Posterior vitreous detachment   Disc Normal Normal   C/D Ratio 0.3 0.35   Macula  Epiretinal membrane seen on OCT is not clinically evident Old macular hole, close negative Watzke   Vessels Normal Normal   Periphery Normal Normal, no holes or tears          IMAGING AND PROCEDURES  Imaging and Procedures for 08/06/19  OCT, Retina - OU - Both Eyes       Right Eye Quality was good. Scan locations included subfoveal. Central Foveal Thickness: 291. Progression has been stable. Findings include epiretinal membrane.   Left Eye Quality was good. Scan locations included subfoveal. Central Foveal Thickness: 280. Progression has been stable.   Notes No topographic distortion due to epiretinal membrane OD,  OS, history of macular hole status post vitrectomy, membrane peeling repair with excellent visual acuity.  Stable                ASSESSMENT/PLAN:  Vitreous hemorrhage of left eye (HCC) Vitreous hemorrhage from with anterior chamber hyphema possibly from an iris vessel anomaly and  PVD in the left eye has now cleared.  Type 2 diabetes mellitus with vascular disease ([place]) The patient has diabetes without any evidence of retinopathy. The patient advised to maintain good blood glucose control, excellent blood pressure control, and  favorable levels of cholesterol, low density lipoprotein, and high density lipoproteins. Follow up in 1 year was recommended. Explained that fluctuations in visual acuity , or "out of focus", may result from large variations of blood sugar control.      ICD-10-CM   1. Posterior vitreous detachment of left eye  H43.812 OCT, Retina - OU - Both Eyes  2. Posterior vitreous detachment of right eye  H43.811 OCT, Retina - OU - Both Eyes  3. Vitreous hemorrhage of left eye (HCC)  H43.12   4. Type 2 diabetes mellitus with vascular disease ([place])  E11.59   5. Posterior vitreous detachment, both eyes  H43.813   6. Macular pucker, left eye  H35.372     1.  2.  3.  Ophthalmic Meds Ordered this visit:  No orders of the defined types were  placed in this encounter.      Return in about 6 months (around 02/06/2020) for DILATE OU, OCT.  There are no Patient Instructions on file for this visit.   Explained the diagnoses, plan, and follow up with the patient and they expressed understanding.  Patient expressed understanding of the importance of proper follow up care.   Clent Demark Judd Mccubbin M.D. Diseases & Surgery of the Retina and Vitreous Retina [facility] Diabetic [facility] 08/06/19     Abbreviations: M myopia (nearsighted); A astigmatism; H hyperopia (farsighted); P presbyopia; Mrx spectacle prescription;  CTL contact lenses; OD right eye; OS left eye; OU both eyes  XT exotropia; ET esotropia; PEK punctate epithelial keratitis; PEE punctate epithelial erosions; DES dry eye syndrome; MGD meibomian gland dysfunction; ATs artificial tears; PFAT's preservative free artificial tears; [place] nuclear sclerotic cataract; PSC posterior subcapsular cataract; ERM epi-retinal membrane; PVD posterior vitreous detachment; RD retinal detachment; DM diabetes mellitus; DR diabetic retinopathy; NPDR non-proliferative diabetic retinopathy; PDR proliferative diabetic retinopathy; CSME clinically significant macular edema; DME diabetic macular edema; dbh dot blot hemorrhages; CWS cotton wool spot; POAG primary open angle glaucoma; C/D cup-to-disc ratio; HVF humphrey visual field; GVF goldmann visual field; OCT optical coherence tomography; IOP intraocular pressure; BRVO Branch retinal vein occlusion; CRVO central retinal vein occlusion; CRAO central retinal artery occlusion; BRAO branch retinal artery occlusion; RT retinal tear; SB scleral buckle; PPV pars plana vitrectomy; VH Vitreous hemorrhage; PRP panretinal laser photocoagulation; IVK intravitreal kenalog; VMT vitreomacular traction; MH Macular hole;  NVD neovascularization of the disc; NVE neovascularization elsewhere; AREDS age related eye disease study; ARMD age related macular degeneration; POAG primary open  angle glaucoma; EBMD epithelial/anterior basement membrane dystrophy; ACIOL anterior chamber intraocular lens; IOL intraocular lens; PCIOL posterior chamber intraocular lens; Phaco/IOL phacoemulsification with intraocular lens placement; [place] photorefractive keratectomy; LASIK laser assisted in situ keratomileusis; HTN hypertension; DM diabetes mellitus; COPD chronic obstructive pulmonary disease

## 2019-08-08 DIAGNOSIS — Z4509 Encounter for adjustment and management of other cardiac device: Secondary | ICD-10-CM | POA: Diagnosis not present

## 2019-08-08 DIAGNOSIS — I480 Paroxysmal atrial fibrillation: Secondary | ICD-10-CM | POA: Diagnosis not present

## 2019-08-08 DIAGNOSIS — Z95818 Presence of other cardiac implants and grafts: Secondary | ICD-10-CM | POA: Diagnosis not present

## 2019-08-08 DIAGNOSIS — I6340 Cerebral infarction due to embolism of unspecified cerebral artery: Secondary | ICD-10-CM | POA: Diagnosis not present

## 2019-08-09 IMAGING — CT CT RENAL STONE PROTOCOL
2 of 4 series · 16 of 46 positions shown, 18 images · non-contrast
Comparison: None.

CLINICAL DATA: Intermittent flank pain for 2 days, severe tonight.
History of diabetes and urinary tract infection.

EXAM:
CT ABDOMEN AND PELVIS WITHOUT CONTRAST
TECHNIQUE: Multidetector CT imaging of the abdomen and pelvis was performed
following the standard protocol without IV contrast.

[Series 2: axial st · axial · 0.75mm/px · z∈[-459,-69]mm · 13 of 88 slices shown, 15 images]
[im 5/88  soft-tissue]
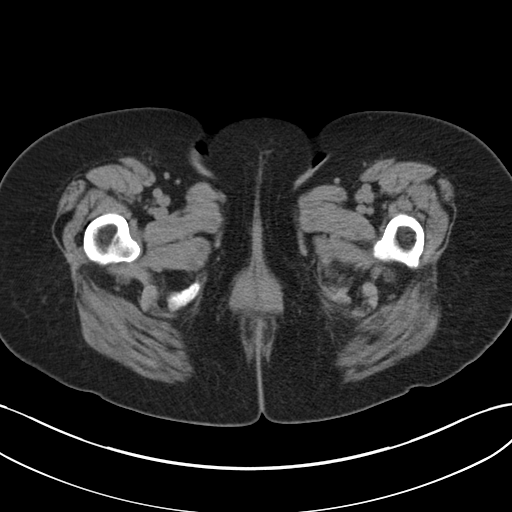
[im 5/88  bone]
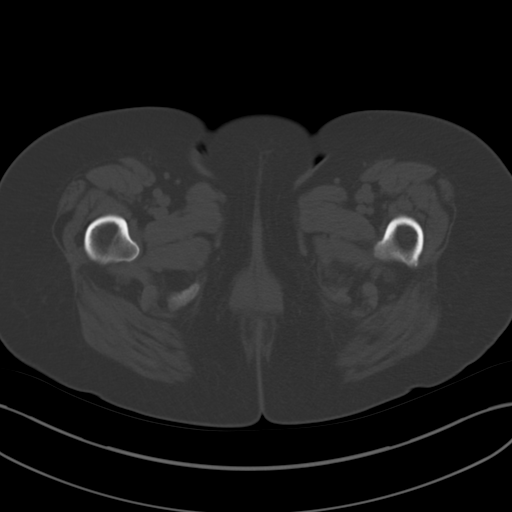
[im 10/88  soft-tissue]
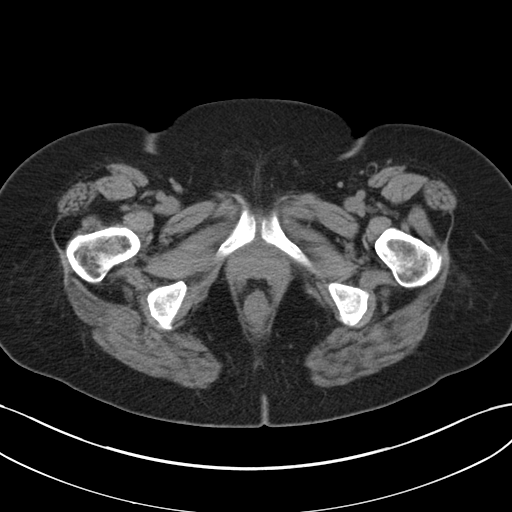
[im 20/88  soft-tissue]
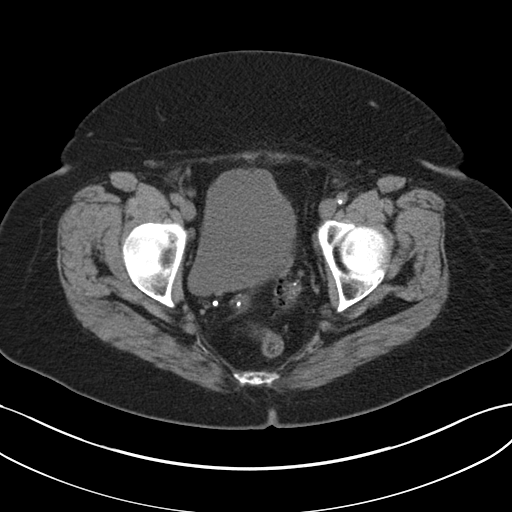
[im 25/88  soft-tissue]
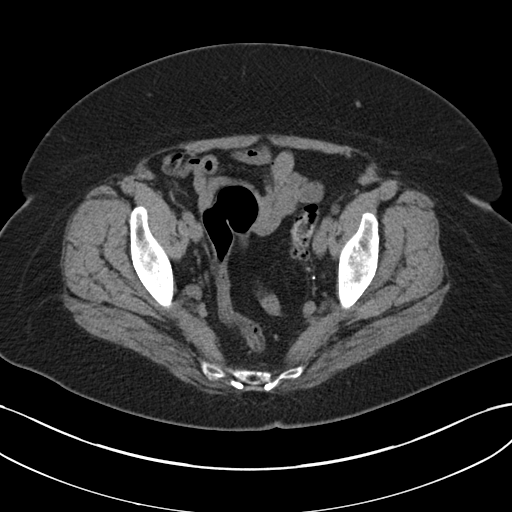
[im 30/88  soft-tissue]
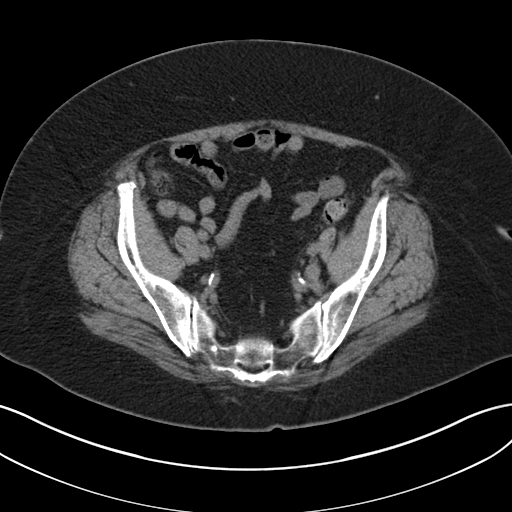
[im 39/88  soft-tissue]
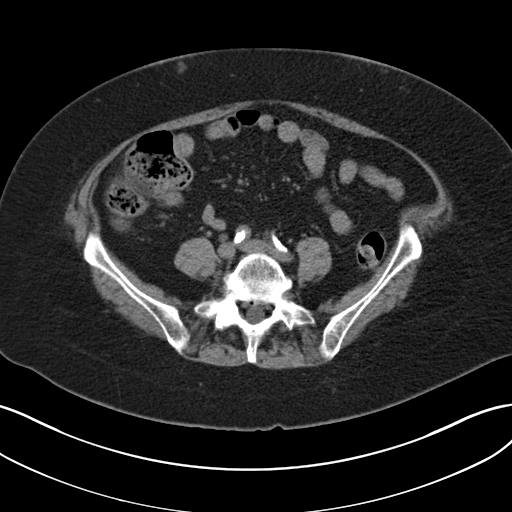
[im 44/88  soft-tissue]
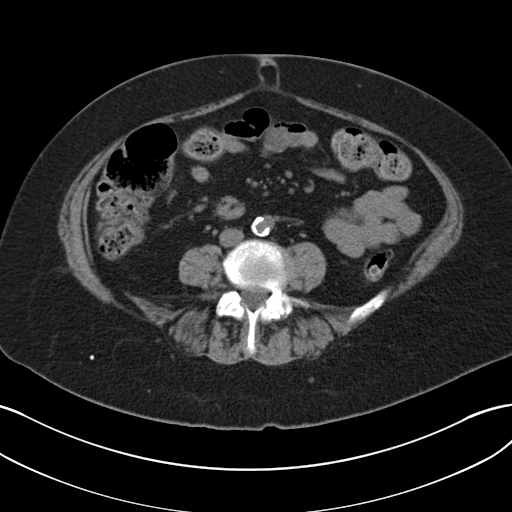
[im 49/88  soft-tissue]
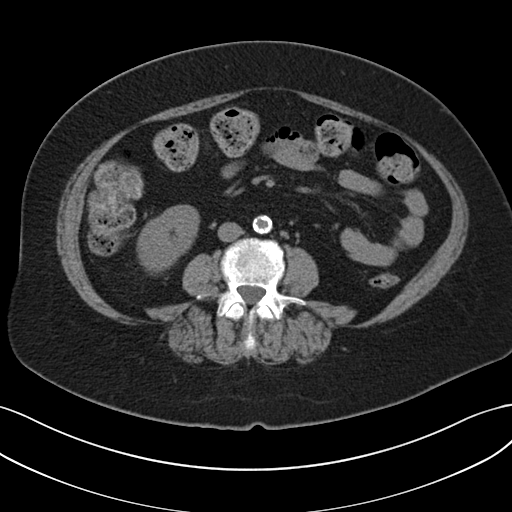
[im 59/88  soft-tissue]
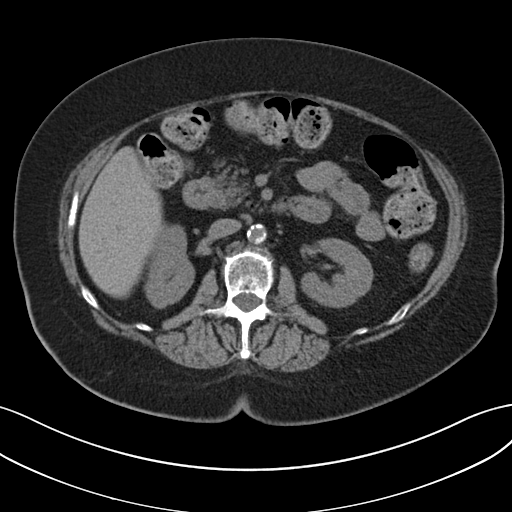
[im 59/88  bone]
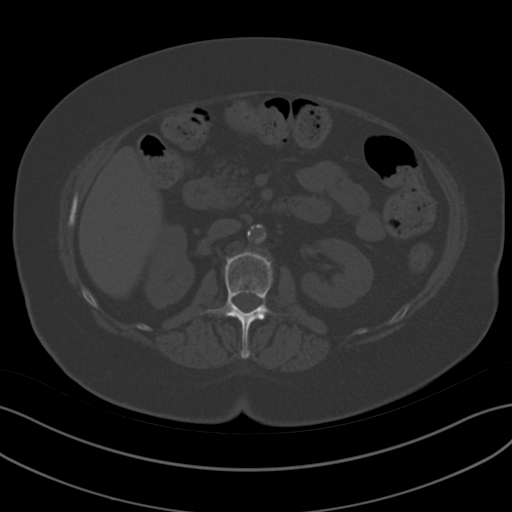
[im 63/88  soft-tissue]
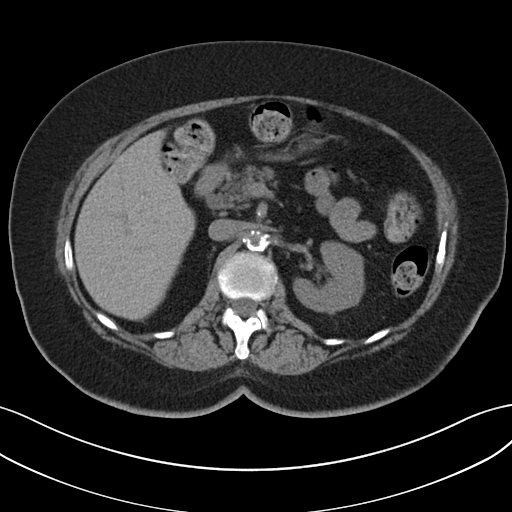
[im 68/88  soft-tissue]
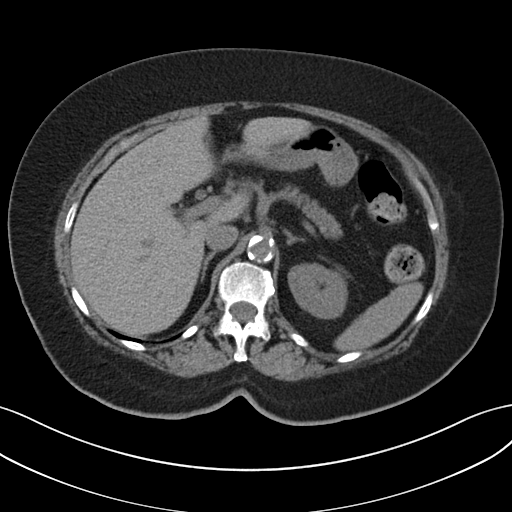
[im 78/88  soft-tissue]
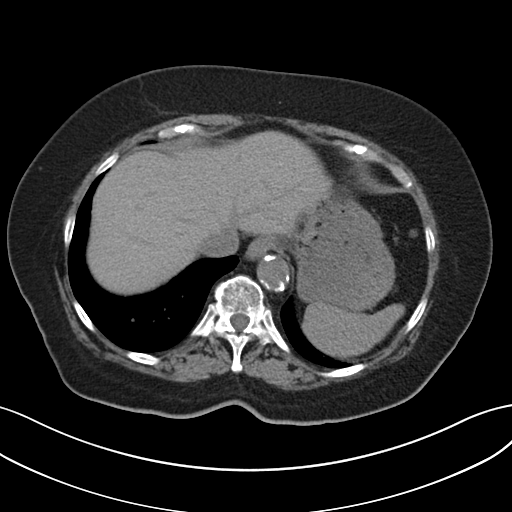
[im 83/88  soft-tissue]
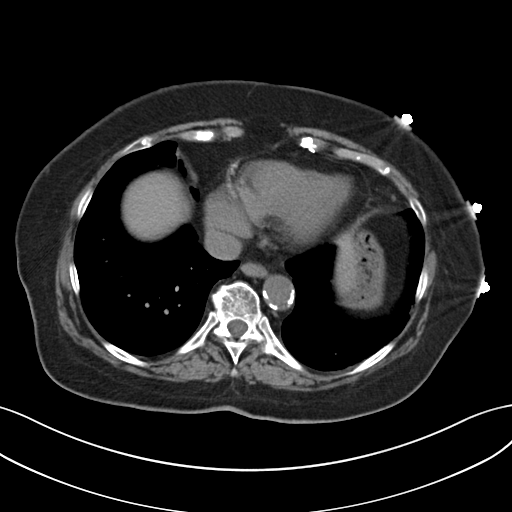

[Series 5: coronal · coronal · 0.77mm/px · 3 of 139 slices shown]
[im 47/139  soft-tissue]
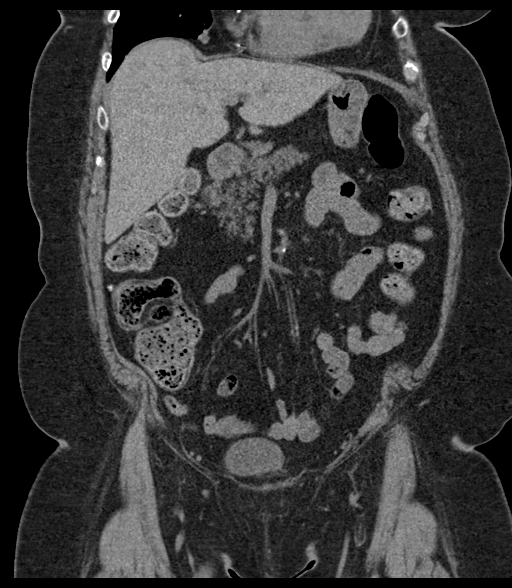
[im 62/139  soft-tissue]
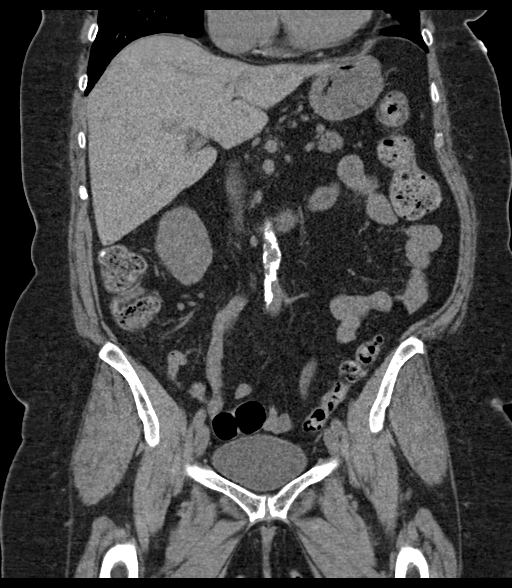
[im 77/139  soft-tissue]
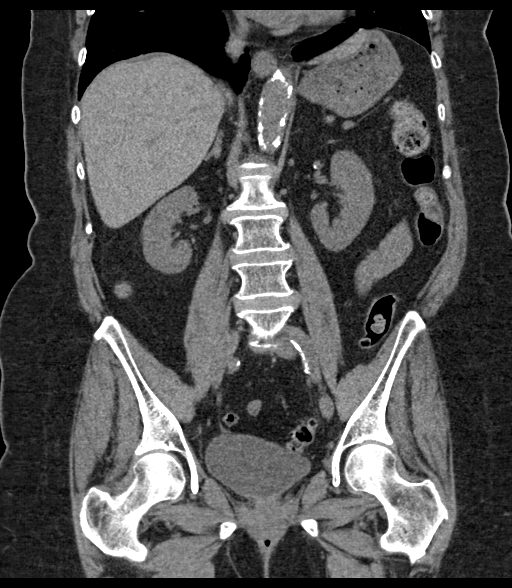

[16 of 46 positions shown; findings below may reference images not displayed]

FINDINGS: LOWER CHEST: Lung bases are clear. The visualized heart size is
normal. Mild coronary artery calcifications. No pericardial
effusion.

HEPATOBILIARY: Status post cholecystectomy.  Normal liver.

PANCREAS: Normal.

SPLEEN: Normal.

ADRENALS/URINARY TRACT: Kidneys are orthotopic, demonstrating normal
size and morphology. No nephrolithiasis, hydronephrosis; limited
assessment for renal masses on this nonenhanced examination. LEFT
vascular calcification. The unopacified ureters are normal in course
and caliber. Urinary bladder is partially distended and
unremarkable. Normal adrenal glands.

STOMACH/BOWEL: The stomach, small and large bowel are normal in
course and caliber without inflammatory changes, sensitivity
decreased by lack of enteric contrast. Superiorly directed moderate
duodenal diverticulum. Moderate colonic diverticulosis. Moderate
amount of retained large bowel stool. Normal appendix.

VASCULAR/LYMPHATIC: Aortoiliac vessels are normal in course and
caliber. Severe calcific atherosclerosis and chronic infrarenal
aortic dissection. No lymphadenopathy by CT size criteria.

REPRODUCTIVE: Status post hysterectomy.

OTHER: No intraperitoneal free fluid or free air. Dropped clip
anterior abdomen.

MUSCULOSKELETAL: Non-acute. Moderate to severe L5-S1 degenerative
disc. No osseous canal stenosis. Mild RIGHT L5-S1 neural foraminal
narrowing.
IMPRESSION: 1. No nephrolithiasis, hydronephrosis or acute intra-abdominopelvic
process.
2. Moderate amount of retained large bowel stool without bowel
obstruction. Moderate colonic diverticulosis.

## 2019-08-22 ENCOUNTER — Other Ambulatory Visit: Payer: Self-pay | Admitting: Cardiology

## 2019-08-22 NOTE — Telephone Encounter (Signed)
Pt last saw Roderic Palau, NP on 09/12/18, last labs 05/10/19 Creat 1.10, age 87, weight 75.8kg, based on specified criteria pt is on appropriate dosage of Eliquis 5mg  BID.  Will refill rx.

## 2019-09-06 DIAGNOSIS — H903 Sensorineural hearing loss, bilateral: Secondary | ICD-10-CM | POA: Diagnosis not present

## 2019-09-08 DIAGNOSIS — Z4509 Encounter for adjustment and management of other cardiac device: Secondary | ICD-10-CM | POA: Diagnosis not present

## 2019-09-08 DIAGNOSIS — Z95818 Presence of other cardiac implants and grafts: Secondary | ICD-10-CM | POA: Diagnosis not present

## 2019-09-08 DIAGNOSIS — I480 Paroxysmal atrial fibrillation: Secondary | ICD-10-CM | POA: Diagnosis not present

## 2019-09-08 DIAGNOSIS — I6340 Cerebral infarction due to embolism of unspecified cerebral artery: Secondary | ICD-10-CM | POA: Diagnosis not present

## 2019-09-11 ENCOUNTER — Other Ambulatory Visit: Payer: Self-pay | Admitting: Cardiology

## 2019-09-11 DIAGNOSIS — I5032 Chronic diastolic (congestive) heart failure: Secondary | ICD-10-CM

## 2019-09-18 ENCOUNTER — Other Ambulatory Visit: Payer: Self-pay

## 2019-09-18 ENCOUNTER — Ambulatory Visit (HOSPITAL_COMMUNITY)
Admission: EM | Admit: 2019-09-18 | Discharge: 2019-09-18 | Disposition: A | Discharge status: Home / Self Care | Payer: Medicare Other | Attending: Family Medicine | Admitting: Family Medicine

## 2019-09-18 ENCOUNTER — Encounter (HOSPITAL_COMMUNITY): Payer: Self-pay | Admitting: Emergency Medicine

## 2019-09-18 DIAGNOSIS — M549 Dorsalgia, unspecified: Secondary | ICD-10-CM | POA: Diagnosis not present

## 2019-09-18 DIAGNOSIS — R109 Unspecified abdominal pain: Secondary | ICD-10-CM | POA: Insufficient documentation

## 2019-09-18 DIAGNOSIS — N3090 Cystitis, unspecified without hematuria: Secondary | ICD-10-CM | POA: Insufficient documentation

## 2019-09-18 LAB — POCT URINALYSIS DIPSTICK, ED / UC
Bilirubin Urine: NEGATIVE
Glucose, UA: 100 mg/dL — AB
Hgb urine dipstick: NEGATIVE
Ketones, ur: NEGATIVE mg/dL
Leukocytes,Ua: NEGATIVE
Nitrite: POSITIVE — AB
Protein, ur: 30 mg/dL — AB
Specific Gravity, Urine: 1.01 (ref 1.005–1.030)
Urobilinogen, UA: 1 mg/dL (ref 0.0–1.0)
pH: 5 (ref 5.0–8.0)

## 2019-09-18 MED ORDER — CEPHALEXIN 500 MG PO CAPS: 500.0000 mg | ORAL_CAPSULE | Freq: Two times a day (BID) | ORAL | 0 refills | Status: DC

## 2019-09-18 MED ORDER — TRAMADOL HCL 50 MG PO TABS: 50.0000 mg | ORAL_TABLET | Freq: Four times a day (QID) | ORAL | 0 refills | Status: DC | PRN

## 2019-09-18 MED ORDER — ONDANSETRON 4 MG PO TBDP: 4.0000 mg | ORAL_TABLET | Freq: Three times a day (TID) | ORAL | 0 refills | Status: DC | PRN

## 2019-09-18 NOTE — ED Triage Notes (Signed)
Pt presents with low back/ kidney pain and urinary frequency. State was prescribed Cipro on 09/12/2019 and completed antibiotic with no relief.

## 2019-09-19 LAB — URINE CULTURE: Culture: <10000 — AB

## 2019-09-20 DIAGNOSIS — E860 Dehydration: Secondary | ICD-10-CM | POA: Diagnosis not present

## 2019-09-20 DIAGNOSIS — R109 Unspecified abdominal pain: Secondary | ICD-10-CM | POA: Diagnosis not present

## 2019-09-20 DIAGNOSIS — I119 Hypertensive heart disease without heart failure: Secondary | ICD-10-CM | POA: Diagnosis not present

## 2019-09-20 DIAGNOSIS — N390 Urinary tract infection, site not specified: Secondary | ICD-10-CM | POA: Diagnosis not present

## 2019-09-20 DIAGNOSIS — N179 Acute kidney failure, unspecified: Secondary | ICD-10-CM | POA: Diagnosis not present

## 2019-09-23 NOTE — ED Provider Notes (Signed)
[facility]    ASSESSMENT & PLAN:  1. Cystitis   2. Right flank pain     Begin; Meds ordered this encounter  Medications  . cephALEXin (KEFLEX) 500 MG capsule    Sig: Take 1 capsule (500 mg total) by mouth 2 (two) times daily.    Dispense:  10 capsule    Refill:  0  . traMADol (ULTRAM) 50 MG tablet    Sig: Take 1 tablet (50 mg total) by mouth every 6 (six) hours as needed.    Dispense:  15 tablet    Refill:  0  . ondansetron (ZOFRAN-ODT) 4 MG disintegrating tablet    Sig: Take 1 tablet (4 mg total) by mouth every 8 (eight) hours as needed for nausea or vomiting.    Dispense:  15 tablet    Refill:  0    No signs of pyelonephritis. Discussed. Urine culture sent. Will notify patient when results available. Will follow up with her PCP or here if not showing improvement over the next 48 hours, sooner if needed.  Outlined signs and symptoms indicating need for more acute intervention. Patient verbalized understanding. After Visit Summary given.  SUBJECTIVE:  Linda Oliver is a 86 y.o. female who complains of urinary frequency and low back discomfort. Recently placed on Cipro; finished; symptoms did not resolve. Afebrile. No gross hematuria reported. No LE edema. Normal PO intake without n/v/d. Without specific abdominal pain. Ambulatory without difficulty. OTC treatment: none.  LMP: No LMP recorded. Patient is postmenopausal. .  OBJECTIVE:  Vitals:   09/18/19 1852  BP: (!) 157/45  Pulse: (!) 57  Resp: 18  Temp: 98.2 F (36.8 C)  TempSrc: Oral  SpO2: 96%   General appearance: alert; no distress HENT: oropharynx: moist Lungs: unlabored respirations Abdomen: soft, non-tender; bowel sounds normal; no masses or organomegaly; no guarding or rebound tenderness Back: no CVA tenderness Extremities: no edema; symmetrical with no gross deformities Skin: warm and dry Neurologic: normal gait Psychological: alert and cooperative; normal mood and affect  Labs  Reviewed  POCT URINALYSIS DIPSTICK, ED / UC - Abnormal; Notable for the following components:   Glucose, UA 100 (*)    Protein, ur 30 (*)    Nitrite POSITIVE (*)    All other components within normal limits    Allergies  Allergen Reactions  . Percocet [Oxycodone-Acetaminophen] Nausea And Vomiting  . Latex Itching and Rash    EKG leads    Past Medical History:  Diagnosis Date  . Anginal pain ([place])    occ  . Anxiety   . Broken ankle   . Bullous pemphigus   . CAD (coronary artery disease)    [place]   80% stenosis ostial Left main, 30% stenosis proximal LAD, 80% stenosis proximal RCA CABG  -  LIMA to LAD, SVG to int, SVG to [place], [place]   . Depression   . Diabetic peripheral neuropathy ([place])   . Encounter for loop recorder check 04/11/2019  . GERD (gastroesophageal reflux disease)   . H/O: stroke: Left occipital stroke and vision loss 08/24/2018  . Headache(784.0)    hx migraines  . Heart murmur    mvp  . Hyperlipidemia   . Hypertension   . Hypertensive heart disease   . Loop recorder Reveal 08/24/2018 04/11/2019   Scheduled Remote loop recorder check 03/10/2019: The presenting rhythm is sinus bradycardia. There were 0 symptomatic patient activations recorded. There were 275 AF episodes and 0 AT  episodes recorded, representing a 4.2 % cumulative AT/AF burden. The longest episode lasted 00:03:40:00 in duration. Some false with PAC. There were 0 tachy episodes detected. There were 0 pause episodes detected. Th  . Nasal bleeding    occ due to O2  . OA (osteoarthritis)   . Obesity (BMI 30-39.9)   . Obstructive sleep apnea on CPAP   . Osteopenia   . Paroxysmal atrial fibrillation ([place]) 04/17/2019  . Peripheral neuropathy   . Seizure ([place])    2009  . Sleep apnea    dx 44yrs ago doesnt use cpap but uses O2 at nite 2L  . Stones in the urinary tract   . Type 2 diabetes mellitus with vascular disease ([place])   . UTI (lower urinary tract infection)    Social  History   Socioeconomic History  . Marital status: Widowed    Spouse name: Not on file  . Number of children: 1  . Years of education: 67  . Highest education level: Not on file  Occupational History  . Not on file  Tobacco Use  . Smoking status: Former Smoker    Packs/day: 1.00    Years: 30.00    Pack years: 30.00    Types: Cigarettes    Quit date: 11/25/1986    Years since quitting: 32.8  . Smokeless tobacco: Never Used  Vaping Use  . Vaping Use: Never used  Substance and Sexual Activity  . Alcohol use: No  . Drug use: No  . Sexual activity: Yes    Birth control/protection: Post-menopausal  Other Topics Concern  . Not on file  Social History Narrative   Married, has 1 child   Caffeine use: none   Social Determinants of Radio broadcast assistant Strain:   . Difficulty of Paying Living Expenses: Not on file  Food Insecurity:   . Worried About Charity fundraiser in the Last Year: Not on file  . Ran Out of Food in the Last Year: Not on file  Transportation Needs:   . Lack of Transportation (Medical): Not on file  . Lack of Transportation (Non-Medical): Not on file  Physical Activity:   . Days of Exercise per Week: Not on file  . Minutes of Exercise per Session: Not on file  Stress:   . Feeling of Stress : Not on file  Social Connections:   . Frequency of Communication with Friends and Family: Not on file  . Frequency of Social Gatherings with Friends and Family: Not on file  . Attends Religious Services: Not on file  . Active Member of Clubs or Organizations: Not on file  . Attends Archivist Meetings: Not on file  . Marital Status: Not on file  Intimate Partner Violence:   . Fear of Current or Ex-Partner: Not on file  . Emotionally Abused: Not on file  . Physically Abused: Not on file  . Sexually Abused: Not on file   Family History  Problem Relation Age of Onset  . Coronary artery disease Mother   . Heart disease Father   . Cancer Father          skin  . Cancer Brother   . Cancer - Colon Brother        brain, Lymphoma       Vanessa Kick, MD 09/23/19 [phone]

## 2019-09-24 DIAGNOSIS — I119 Hypertensive heart disease without heart failure: Secondary | ICD-10-CM | POA: Diagnosis not present

## 2019-09-24 DIAGNOSIS — R109 Unspecified abdominal pain: Secondary | ICD-10-CM | POA: Diagnosis not present

## 2019-09-24 DIAGNOSIS — N1831 Chronic kidney disease, stage 3a: Secondary | ICD-10-CM | POA: Diagnosis not present

## 2019-09-24 DIAGNOSIS — I4891 Unspecified atrial fibrillation: Secondary | ICD-10-CM | POA: Diagnosis not present

## 2019-09-24 DIAGNOSIS — N179 Acute kidney failure, unspecified: Secondary | ICD-10-CM | POA: Diagnosis not present

## 2019-09-24 DIAGNOSIS — E860 Dehydration: Secondary | ICD-10-CM | POA: Diagnosis not present

## 2019-09-25 ENCOUNTER — Other Ambulatory Visit: Payer: Self-pay

## 2019-09-25 ENCOUNTER — Other Ambulatory Visit: Payer: Self-pay | Admitting: Registered Nurse

## 2019-09-25 ENCOUNTER — Ambulatory Visit
Admission: RE | Admit: 2019-09-25 | Discharge: 2019-09-25 | Disposition: A | Discharge status: Home / Self Care | Payer: Medicare Other | Source: Ambulatory Visit | Attending: Registered Nurse | Admitting: Registered Nurse

## 2019-09-25 DIAGNOSIS — R109 Unspecified abdominal pain: Secondary | ICD-10-CM

## 2019-09-25 DIAGNOSIS — J439 Emphysema, unspecified: Secondary | ICD-10-CM | POA: Diagnosis not present

## 2019-09-25 DIAGNOSIS — I2510 Atherosclerotic heart disease of native coronary artery without angina pectoris: Secondary | ICD-10-CM | POA: Diagnosis not present

## 2019-09-25 DIAGNOSIS — I700 Atherosclerosis of aorta: Secondary | ICD-10-CM | POA: Diagnosis not present

## 2019-09-25 MED ORDER — IOPAMIDOL (ISOVUE-300) INJECTION 61%
100.0000 mL | Freq: Once | INTRAVENOUS | Status: AC | PRN
  Administered 2019-09-25: 100 mL via INTRAVENOUS

## 2019-10-07 ENCOUNTER — Encounter (HOSPITAL_COMMUNITY): Payer: Self-pay | Admitting: Emergency Medicine

## 2019-10-07 ENCOUNTER — Ambulatory Visit (HOSPITAL_COMMUNITY)
Admission: EM | Admit: 2019-10-07 | Discharge: 2019-10-07 | Disposition: A | Discharge status: Home / Self Care | Payer: Medicare Other | Attending: Emergency Medicine | Admitting: Emergency Medicine

## 2019-10-07 ENCOUNTER — Emergency Department (HOSPITAL_COMMUNITY): Payer: Medicare Other

## 2019-10-07 ENCOUNTER — Other Ambulatory Visit: Payer: Self-pay

## 2019-10-07 ENCOUNTER — Emergency Department (HOSPITAL_COMMUNITY)
Admission: EM | Admit: 2019-10-07 | Discharge: 2019-10-08 | Disposition: A | Discharge status: Home / Self Care | Payer: Medicare Other | Attending: Emergency Medicine | Admitting: Emergency Medicine

## 2019-10-07 DIAGNOSIS — R079 Chest pain, unspecified: Secondary | ICD-10-CM | POA: Diagnosis not present

## 2019-10-07 DIAGNOSIS — Z5321 Procedure and treatment not carried out due to patient leaving prior to being seen by health care provider: Secondary | ICD-10-CM | POA: Diagnosis not present

## 2019-10-07 DIAGNOSIS — M549 Dorsalgia, unspecified: Secondary | ICD-10-CM | POA: Diagnosis not present

## 2019-10-07 DIAGNOSIS — M898X1 Other specified disorders of bone, shoulder: Secondary | ICD-10-CM

## 2019-10-07 DIAGNOSIS — M79602 Pain in left arm: Secondary | ICD-10-CM | POA: Diagnosis not present

## 2019-10-07 DIAGNOSIS — J90 Pleural effusion, not elsewhere classified: Secondary | ICD-10-CM | POA: Diagnosis not present

## 2019-10-07 DIAGNOSIS — M79622 Pain in left upper arm: Secondary | ICD-10-CM | POA: Diagnosis not present

## 2019-10-07 LAB — CBC
HCT: 34.6 % — ABNORMAL LOW (ref 36.0–46.0)
Hemoglobin: 10.6 g/dL — ABNORMAL LOW (ref 12.0–15.0)
MCH: 28 pg (ref 26.0–34.0)
MCHC: 30.6 g/dL (ref 30.0–36.0)
MCV: 91.3 fL (ref 80.0–100.0)
Platelets: 240 10*3/uL (ref 150–400)
RBC: 3.79 MIL/uL — ABNORMAL LOW (ref 3.87–5.11)
RDW: 14.7 % (ref 11.5–15.5)
WBC: 7.2 10*3/uL (ref 4.0–10.5)
nRBC: 0 % (ref 0.0–0.2)

## 2019-10-07 LAB — BASIC METABOLIC PANEL
Anion gap: 10 (ref 5–15)
BUN: 20 mg/dL (ref 8–23)
CO2: 24 mmol/L (ref 22–32)
Calcium: 8.5 mg/dL — ABNORMAL LOW (ref 8.9–10.3)
Chloride: 103 mmol/L (ref 98–111)
Creatinine, Ser: 1.33 mg/dL — ABNORMAL HIGH (ref 0.44–1.00)
GFR calc Af Amer: 42 mL/min — ABNORMAL LOW (ref >60)
GFR calc non Af Amer: 36 mL/min — ABNORMAL LOW (ref >60)
Glucose, Bld: 251 mg/dL — ABNORMAL HIGH (ref 70–99)
Potassium: 4.3 mmol/L (ref 3.5–5.1)
Sodium: 137 mmol/L (ref 135–145)

## 2019-10-07 LAB — TROPONIN I (HIGH SENSITIVITY): Troponin I (High Sensitivity): 60 ng/L — ABNORMAL HIGH (ref <18)

## 2019-10-07 NOTE — ED Provider Notes (Signed)
[name] presents with complaints of left arm pain and pain to left posterior upper back/ scapula. Started today. No specific trigger but she feels like she was holding a book with her left arm which may have triggered. Worsening. Pain is constant. No specific chest pain. No shortness of breath. Pain even at rest. History of MI with triple bypass. History of afib, on eliquis, as well as CHF. EKG unchanged from baseline tonight. With her cardiac history and pain she is in, she is holding arm and writhing in pain while at rest, without specific known trigger, recommend going to the ER now for further evaluation with concern for acs.  Patient granddaughter taking her now.    Zigmund Gottron, NP 10/07/19 1935

## 2019-10-07 NOTE — ED Triage Notes (Signed)
Pt reports w/ left arm pain and back pain that woke her up this morning.  Pt denies any other issues at this time.  No neuro symptoms at this time.

## 2019-10-07 NOTE — ED Notes (Signed)
Patient is being discharged from the [facility] and sent to the Emergency Department via wheelchair by staff. Per Augusto Gamble, PA, patient is stable but in need of higher level of care due to left arm pain and numbness. Patient is aware and verbalizes understanding of plan of care.  Vitals:   10/07/19 1950  BP: (!) 123/96  Pulse: 76  Resp: 15  SpO2: 95%

## 2019-10-07 NOTE — Discharge Instructions (Signed)
Please go to the ER for further evaluation of your pain, as I am concerned about your heart.

## 2019-10-08 NOTE — ED Notes (Signed)
Pt called 3x no repsonse 

## 2019-10-09 ENCOUNTER — Ambulatory Visit: Payer: Medicare Other | Admitting: Student

## 2019-10-09 ENCOUNTER — Other Ambulatory Visit: Payer: Self-pay

## 2019-10-09 ENCOUNTER — Encounter: Payer: Self-pay | Admitting: Student

## 2019-10-09 VITALS — BP 200/90 | HR 72 | Ht 60.0 in | Wt 169.0 lb

## 2019-10-09 DIAGNOSIS — R0789 Other chest pain: Secondary | ICD-10-CM | POA: Diagnosis not present

## 2019-10-09 DIAGNOSIS — M25512 Pain in left shoulder: Secondary | ICD-10-CM | POA: Diagnosis not present

## 2019-10-09 DIAGNOSIS — I5032 Chronic diastolic (congestive) heart failure: Secondary | ICD-10-CM | POA: Diagnosis not present

## 2019-10-09 DIAGNOSIS — I10 Essential (primary) hypertension: Secondary | ICD-10-CM | POA: Diagnosis not present

## 2019-10-09 DIAGNOSIS — I2510 Atherosclerotic heart disease of native coronary artery without angina pectoris: Secondary | ICD-10-CM | POA: Diagnosis not present

## 2019-10-09 DIAGNOSIS — I6340 Cerebral infarction due to embolism of unspecified cerebral artery: Secondary | ICD-10-CM | POA: Diagnosis not present

## 2019-10-09 DIAGNOSIS — R509 Fever, unspecified: Secondary | ICD-10-CM | POA: Diagnosis not present

## 2019-10-09 DIAGNOSIS — R778 Other specified abnormalities of plasma proteins: Secondary | ICD-10-CM | POA: Diagnosis not present

## 2019-10-09 DIAGNOSIS — I4510 Unspecified right bundle-branch block: Secondary | ICD-10-CM | POA: Diagnosis not present

## 2019-10-09 DIAGNOSIS — Z4509 Encounter for adjustment and management of other cardiac device: Secondary | ICD-10-CM | POA: Diagnosis not present

## 2019-10-09 DIAGNOSIS — I25118 Atherosclerotic heart disease of native coronary artery with other forms of angina pectoris: Secondary | ICD-10-CM

## 2019-10-09 DIAGNOSIS — R9431 Abnormal electrocardiogram [ECG] [EKG]: Secondary | ICD-10-CM | POA: Diagnosis not present

## 2019-10-09 DIAGNOSIS — I480 Paroxysmal atrial fibrillation: Secondary | ICD-10-CM | POA: Diagnosis not present

## 2019-10-09 DIAGNOSIS — E1149 Type 2 diabetes mellitus with other diabetic neurological complication: Secondary | ICD-10-CM | POA: Diagnosis not present

## 2019-10-09 DIAGNOSIS — Z1152 Encounter for screening for COVID-19: Secondary | ICD-10-CM | POA: Diagnosis not present

## 2019-10-09 DIAGNOSIS — I119 Hypertensive heart disease without heart failure: Secondary | ICD-10-CM | POA: Diagnosis not present

## 2019-10-09 DIAGNOSIS — Z95818 Presence of other cardiac implants and grafts: Secondary | ICD-10-CM | POA: Diagnosis not present

## 2019-10-09 DIAGNOSIS — M79602 Pain in left arm: Secondary | ICD-10-CM

## 2019-10-09 DIAGNOSIS — I4891 Unspecified atrial fibrillation: Secondary | ICD-10-CM | POA: Diagnosis not present

## 2019-10-09 NOTE — Patient Instructions (Signed)
Go to [facility] for bloodwork today.

## 2019-10-09 NOTE — Progress Notes (Signed)
Primary Physician/Referring:  Shon Baton, MD  Patient ID: Linda Oliver, female    DOB: 12-26-1932, 86 y.o.   MRN: [id number]  Chief Complaint  Patient presents with   arm pain   Follow-up   HPI:    Linda Oliver  is a 86 y.o. Caucasian female patient with CAD s/p CABG x 3 in 1996 in [place], HTN, hyperlipidemia, diabetes with PVD and sleep apnea on CPAP and now compliant, carotid stenosis, and history of  autonomic orthostatic hypotension, medial left occipital infarct in embolic pattern by MRI in 2020 and loop recorder implanted showed paroxysmal atrial fibrillation, for which she is now on Eliquis.  Presents for urgent visit.  2 days ago patient started experiencing left arm and left scapula pain.  She went to urgent care for evaluation and due to her cardiac history was sent to the emergency room for evaluation.  At the emergency room she did have some blood work drawn which included a troponin that came back at 60 on 10/07/2019.  However the wait in the emergency room was long and patient left before being evaluated due to concerns related to the COVID-19 pandemic.  She contacted her PCP yesterday who instructed her to try Tylenol, tramadol, or Flexeril for the pain.  Pain was not relieved with use of these medications so patient was seen in her PCPs office this morning.  Her PCP requested that she be seen in our office on an urgent basis due to complaints of left arm pain and left subscapular pain and elevated troponin yesterday.  Patient denies injury to the arm, but reports 2 days ago the pain started and has been constant with fluctuating intensity.  The pain is worse with movement and palpation along the left scapula and left elbow.  Denies chest pain, shortness of breath, diaphoresis, dizziness, syncope or near syncope.  She has not taken any nitroglycerin.  Of note she has also not taken any of her medications today including her antihypertensive medications, which likely explains elevated  blood pressure today.   Past Medical History:  Diagnosis Date   Anginal pain ([place])    occ   Anxiety    Broken ankle    Bullous pemphigus    CAD (coronary artery disease)    [place]   80% stenosis ostial Left main, 30% stenosis proximal LAD, 80% stenosis proximal RCA CABG  -  LIMA to LAD, SVG to int, SVG to [place], [place]    Depression    Diabetic peripheral neuropathy ([place])    Encounter for loop recorder check 04/11/2019   GERD (gastroesophageal reflux disease)    H/O: stroke: Left occipital stroke and vision loss 08/24/2018   Headache(784.0)    hx migraines   Heart murmur    mvp   Hyperlipidemia    Hypertension    Hypertensive heart disease    Loop recorder Reveal 08/24/2018 04/11/2019   Scheduled Remote loop recorder check 03/10/2019: The presenting rhythm is sinus bradycardia. There were 0 symptomatic patient activations recorded. There were 275 AF episodes and 0 AT episodes recorded, representing a 4.2 % cumulative AT/AF burden. The longest episode lasted 00:03:40:00 in duration. Some false with PAC. There were 0 tachy episodes detected. There were 0 pause episodes detected. Th   Nasal bleeding    occ due to O2   OA (osteoarthritis)    Obesity (BMI 30-39.9)    Obstructive sleep apnea on CPAP    Osteopenia  Paroxysmal atrial fibrillation ([place]) 04/17/2019   Peripheral neuropathy    Seizure ([place])    2009   Sleep apnea    dx 76yrs ago doesnt use cpap but uses O2 at nite 2L   Stones in the urinary tract    Type 2 diabetes mellitus with vascular disease ([place])    UTI (lower urinary tract infection)    Past Surgical History:  Procedure Laterality Date   ABDOMINAL HYSTERECTOMY  70's   CARDIAC CATHETERIZATION     CARPAL TUNNEL RELEASE Bilateral    CATARACT EXTRACTION W/PHACO Bilateral    CHOLECYSTECTOMY     CORONARY ARTERY BYPASS GRAFT  96   LOOP RECORDER INSERTION N/A 08/24/2018   Procedure: LOOP RECORDER INSERTION;   Surgeon: Constance Haw, MD;  Location: [facility];  Service: Cardiovascular;  Laterality: N/A;   SHOULDER ARTHROSCOPY     rt   SHOULDER ARTHROSCOPY WITH ROTATOR CUFF REPAIR AND SUBACROMIAL DECOMPRESSION  12/01/2011   Procedure: SHOULDER ARTHROSCOPY WITH ROTATOR CUFF REPAIR AND SUBACROMIAL DECOMPRESSION;  Surgeon: Nita Sells, MD;  Location: [facility];  Service: Orthopedics;  Laterality: Left;  shoulder debridement calcific tendonitis   Family History  Problem Relation Age of Onset   Coronary artery disease Mother    Heart disease Father    Cancer Father        skin   Cancer Brother    Cancer - Colon Brother        brain, Lymphoma    Social History   Tobacco Use   Smoking status: Former Smoker    Packs/day: 1.00    Years: 30.00    Pack years: 30.00    Types: Cigarettes    Quit date: 11/25/1986    Years since quitting: 32.8   Smokeless tobacco: Never Used  Substance Use Topics   Alcohol use: No   Marital Status: Widowed  ROS  Review of Systems  Cardiovascular: Positive for irregular heartbeat (chronic). Negative for chest pain, claudication, dyspnea on exertion, leg swelling, near-syncope, orthopnea, paroxysmal nocturnal dyspnea and syncope.  Respiratory: Positive for snoring (on CPAP). Negative for hemoptysis and wheezing.   Musculoskeletal: Positive for joint pain (left elbow).       Left arm pain radiating from the shoulder to the hand. Numbness of left fifth digit.  Left subscapular pain   Gastrointestinal: Negative for melena.  Genitourinary: Positive for bladder incontinence (chronic).  Neurological: Negative for dizziness.   Objective  Blood pressure (!) 200/90, pulse 72, height 5' (1.524 m), weight 169 lb (76.7 kg), SpO2 95 %.  Vitals with BMI 10/09/2019 10/07/2019 10/07/2019  Height 5\' 0"  5\' 0"  -  Weight 169 lbs 167 lbs 2 oz -  BMI 45.80 99.83 -  Systolic 382 - 505  Diastolic 90 - 64  Pulse 72 - 71     Physical  Exam Constitutional:      General: She is not in acute distress.    Appearance: Normal appearance. She is well-developed. She is not diaphoretic.  Neck:     Vascular: No JVD.     Comments: Carotid bruit is present. Cardiovascular:     Rate and Rhythm: Normal rate and regular rhythm.     Pulses: Intact distal pulses.          Carotid pulses are 2+ on the right side with bruit and 2+ on the left side with bruit.      Radial pulses are 2+ on the right side and 2+ on the left side.  Posterior tibial pulses are 2+ on the right side and 2+ on the left side.     Heart sounds: Murmur heard.  Systolic murmur is present at the upper right sternal border.  No gallop.      Comments: Trace non-pitting ankle edema bilaterally.  No JVD.  Pulmonary:     Effort: Pulmonary effort is normal. No accessory muscle usage or respiratory distress.     Breath sounds: Normal breath sounds. No wheezing, rhonchi or rales.  Chest:     Chest wall: Tenderness (location of loop recorder ) present.  Abdominal:     General: Bowel sounds are normal.     Palpations: Abdomen is soft.     Tenderness: There is no abdominal tenderness.  Musculoskeletal:     Right shoulder: Normal.     Left shoulder: Tenderness (along scapula) present. No swelling or deformity. Decreased range of motion.     Right elbow: Normal.     Left elbow: No swelling or deformity. Decreased range of motion. Tenderness present in medial epicondyle.  Neurological:     Mental Status: She is alert.    Laboratory examination:   Recent Labs    05/10/19 1442 10/07/19 2012  NA 140 137  K 3.4* 4.3  CL 99 103  CO2 27 24  GLUCOSE 168* 251*  BUN 13 20  CREATININE 1.10* 1.33*  CALCIUM 8.5* 8.5*  GFRNONAA 46* 36*  GFRAA 53* 42*   estimated creatinine clearance is 28.3 mL/min (A) (by C-G formula based on SCr of 1.33 mg/dL (H)).  CMP Latest Ref Rng & Units 10/07/2019 05/10/2019 10/04/2018  Glucose 70 - 99 mg/dL 251(H) 168(H) 336(H)  BUN 8 - 23  mg/dL 20 13 17   Creatinine 0.44 - 1.00 mg/dL 1.33(H) 1.10(H) 1.27(H)  Sodium 135 - 145 mmol/L 137 140 137  Potassium 3.5 - 5.1 mmol/L 4.3 3.4(L) 4.2  Chloride 98 - 111 mmol/L 103 99 101  CO2 22 - 32 mmol/L 24 27 24   Calcium 8.9 - 10.3 mg/dL 8.5(L) 8.5(L) 8.7(L)  Total Protein 6.5 - 8.1 g/dL - - -  Total Bilirubin 0.3 - 1.2 mg/dL - - -  Alkaline Phos 38 - 126 U/L - - -  AST 15 - 41 U/L - - -  ALT 0 - 44 U/L - - -   CBC Latest Ref Rng & Units 10/07/2019 10/04/2018 08/23/2018  WBC 4.0 - 10.5 K/uL 7.2 9.3 8.6  Hemoglobin 12.0 - 15.0 g/dL 10.6(L) 11.2(L) 11.1(L)  Hematocrit 36 - 46 % 34.6(L) 37.5 34.7(L)  Platelets 150 - 400 K/uL 240 279 240   Lipid Panel     Component Value Date/Time   CHOL 127 08/23/2018 0434   TRIG 358 (H) 08/23/2018 0434   HDL 27 (L) 08/23/2018 0434   CHOLHDL 4.7 08/23/2018 0434   VLDL 72 (H) 08/23/2018 0434   LDLCALC 28 08/23/2018 0434   HEMOGLOBIN A1C Lab Results  Component Value Date   HGBA1C 8.7 (H) 08/23/2018   MPG 202.99 08/23/2018   TSH No results for input(s): TSH in the last 8760 hours.   BNP (last 3 results) Recent Labs    05/10/19 1442  BNP 323.0*   External labs:  Cholesterol, total 127.000 08/23/2018 HDL 27.000 08/23/2018 LDL 28.000 08/23/2018 Triglycerides 358.000 08/23/2018  A1C 8.400 % 03/29/2019 TSH 1.940 04/01/2019  Hemoglobin 11.200 10/04/2018 INR 1.100 08/22/2018 Platelets 279.000 10/04/2018  Creatinine, Serum 1.000 mg/ 04/01/2019 Potassium 4.200 10/04/2018 Magnesium N/D ALT (SGPT) 12.000 uni 04/01/2019  Medications and allergies  Allergies  Allergen Reactions   Percocet [Oxycodone-Acetaminophen] Nausea And Vomiting   Latex Itching and Rash    EKG leads     Current Outpatient Medications  Medication Instructions   acetaminophen (TYLENOL) 500 mg, Oral, Every 4 hours PRN   apixaban (ELIQUIS) 5 mg, Oral, 2 times daily, Will need to see MD for future refills.   B-D UF III MINI PEN NEEDLES 31G X 5 MM MISC SMARTSIG:1 Each  SUB-Q Daily   busPIRone (BUSPAR) 7.5 mg, Oral, 2 times daily   cephALEXin (KEFLEX) 500 mg, Oral, 2 times daily   cyclobenzaprine (FLEXERIL) 10 mg, Oral, 2 times daily PRN   DULoxetine (CYMBALTA) 60 mg, Oral, Daily   ezetimibe (ZETIA) 10 mg, Oral, Daily   fesoterodine (TOVIAZ) 8 mg, Oral, Every evening   fluticasone (FLONASE) 50 MCG/ACT nasal spray 1 spray, Each Nare, 2 times daily PRN   FREESTYLE LITE test strip No dose, route, or frequency recorded.   furosemide (LASIX) 20 mg, Oral, Daily PRN   gabapentin (NEURONTIN) 300 mg, Oral, 2 times daily   glimepiride (AMARYL) 1 mg, Oral, Daily   hydrOXYzine (ATARAX/VISTARIL) 50 mg, Oral, Every evening   isosorbide dinitrate (ISORDIL) 30 mg, Oral, 3 times daily   Lantus SoloStar 40 Units, Subcutaneous, Daily before breakfast   metoprolol succinate (TOPROL-XL) 12.5 mg, Oral, 2 times daily   NAMZARIC 28-10 MG CP24 TAKE 1 CAPSULE DAILY   nitroGLYCERIN (NITROSTAT) 0.4 mg, Sublingual, Every 5 min PRN   ondansetron (ZOFRAN-ODT) 4 mg, Oral, Every 8 hours PRN   pantoprazole (PROTONIX) 40 mg, Oral, Every evening   potassium chloride SA (KLOR-CON) 20 MEQ tablet TAKE 1 TABLET DAILY AS NEEDED (WITH FUROSEMIDE). TAKE 2 TABLETS ON PICKING UP MEDICATION   rosuvastatin (CRESTOR) 10 mg, Oral, Daily before supper   sitaGLIPtin (JANUVIA) 100 mg, Oral, Daily   traMADol (ULTRAM) 50 mg, Oral, Every 6 hours PRN   valsartan (DIOVAN) 160 mg, Oral, Daily   Radiology:   CT Angio Neck 08/23/2018: 1. Left subclavian origin high-grade stenosis with delayed enhancement of the distal left vertebral artery. Additionally, there is irregular plaque/mural thrombus involving the aortic isthmus and left subclavian origin. 2. Left PCA branch occlusion correlating with the known acute infarct. 3. Cervical carotid atherosclerosis with up to 50% stenosis at the left ICA bulb. 4. 3 cm right thyroid mass, recommend elective ultrasound.  CXR PA/Lat  05/10/2019: The heart size and mediastinal contours are within normal limits. Prior CABG again noted. Aortic atherosclerosis incidentally noted. Cardiac loop recorder is seen anterior chest wall. Both lungs are clear. The visualized skeletal structures are unremarkable.  Chest x-ray 10/07/2019: Stable cardiomediastinal silhouette. No pneumothorax or pleural effusion is noted. Both lungs are clear. The visualized skeletal structures are unremarkable IMPRESSION: No active cardiopulmonary disease.  Cardiac Studies:   Coronary angiogram 1996: [place]   80% stenosis ostial Left main, 30% stenosis proximal LAD, 80% stenosis proximal RCA CABG  -  LIMA to LAD, SVG to int, SVG to [place], [place]   Lexiscan myoview stress test 10/24/2016: 1. Resting EKG shows T inversion in high lateral leads. There was <1 mm ST depression and T inversion in inferior and lateral leads, reverted to baseline immediately into recovery. Equivocal ischemic change due to pharmacologic stress with lexiscan. Symptoms included dyspnea and dizziness.  2. Very subtle basal inferior small sized reversible defect suggestive of ischemia noted. Normal LVEF without wall motion abnormality at 72%. Low risk study.  Echocardiogram 03/22/2019:  1. Normal LV systolic  function with visual EF 55-60%. Left ventricle cavity is normal in size. Moderate left ventricular hypertrophy. Normal global wall motion. No obvious regional wall motion abnormalities. Doppler evidence of grade II diastolic dysfunction, elevated LAP. Calculated EF 59%. 2. Left atrial cavity is mildly dilated. 3. Grossly, right atrial cavity is mildly dilated. 4. Moderate aortic regurgitation. 5. Mild to moderate mitral regurgitation. 6. Mild to moderate tricuspid regurgitation. Moderate pulmonary hypertension. RVSP measures 59 mmHg. 7. Mild pulmonic regurgitation. 8. Prior study dated 08/22/2017, LVEF of 60-65%, mild to moderate AR. Otherwise no significant  change.   Carotid artery duplex 03/22/2019:  Stenosis in the right internal carotid artery (50-69%). Stenosis in the right external carotid artery (<50%).  Stenosis in the left internal carotid artery (16-49%). Stenosis in the left external carotid artery (<50%)  Antegrade right vertebral artery flow. Antegrade left vertebral artery flow.  Compared to the study done on 10/152020, no significant change. Follow up in six months is appropriate if clinically indicated.  Lexiscan Tetrofosmin Stress Test  05/15/2019: Nondiagnostic ECG stress. Myocardial perfusion is abnormal. Mild degree large extent perfusion defect consistent with moderate ischemia located in the apical inferior wall, mid inferior wall, mid inferoseptal wall, basal inferior wall and basal inferoseptal wall (Right Coronary Artery region) of left ventricle. Gated SPECT imaging of the left ventricle was normal,  demonstrating hypokinesis of the apical inferior wall, mid inferior wall and basal inferior wall.  Stress LV EF is normal 55%.  Compared to the study done on 10/24/2016, previously minimal ischemia was evident and LVEF was 72%.  Low risk.  Scheduled Remote loop recorder check 09/08/2019:  There were 6 AF episodes and 0 AT episodes recorded, representing a 1.0 % cumulative AT/AF burden. The longest episode lasted 00:05:52:00 in duration. Review of EGMs demonstrates paroxysmal atrial fibrillation and sinus arrhythmia. HR 86 to 120 BPM   EKG EKG 10/09/2019 (external):ectopic atrial rhythm at rate of 70 bpm. Normal axis. Incomplete right bundle branch block. Lateral ST depression, unchanged from EKG 10/07/2019 as well as EKG 03/11/2019.  03/11/2019: Normal sinus rhythm with PAC's at 67 bpm, normal axis, IRBBB, lateral ST depression, cannot exclude ischemia, unchanged from EKG in June 2019.  Assessment     ICD-10-CM   1. Coronary artery disease of native artery of native heart with stable angina pectoris ([place])  I25.118  Troponin T  2. Left arm pain  M79.602   3. Abnormal EKG  R94.31   4. Chronic diastolic (congestive) heart failure (HCC)  I50.32 Troponin T  5. Paroxysmal atrial fibrillation ([facility]) CHA2DS2-VASCScore: Risk Score 8,  Yearly risk of stroke 6.7%.   I48.0   6. Primary hypertension  I10      No orders of the defined types were placed in this encounter.   There are no discontinued medications.   Recommendations:   Linda Oliver  is a 86 y.o. Caucasian female patient with CAD s/p CABG x 3 in 1996 in [place], HTN, hyperlipidemia, diabetes with PVD and sleep apnea on CPAP and now compliant, carotid stenosis, and history of  autonomic orthostatic hypotension, medial left occipital infarct in embolic pattern by MRI in 2020 and loop recorder implanted showed paroxysmal atrial fibrillation, for which she is now on Eliquis.    Patient presents for an urgent visit for evaluation of left arm and shoulder pain in the context of elevated troponin on 10/07/2019. I personally reviewed, in collaboration with Dr. Einar Gip, patient's EKG from ED on 10/07/2019 and EKG from PCP today. Patient's EKG today  does show lateral ST depression, however this is unchanged from previous EKGs both on 10/07/2019 and on 03/11/2019.  Patient's exam today is also reassuring as pain is reproducible upon palpation of left scapula and left elbow. There are no clinical signs of heart failure.    Suspect left arm pain to be of musculoskeletal origin.  However in view of patient's history of coronary artery disease and elevated risk for myocardial ischemia will obtain troponin today.  Of note she had a single troponin drawn on 10/07/2019 that was 60, however she left the emergency department before second troponin could be completed.  Patient has had echocardiogram, nuclear stress test, and carotid artery duplex within the last year.  Encouraged patient to follow up with PCP for left arm and scapula pain.   Blood pressure marked elevated today at 200/90,  however she did not take any of her medications including her antihypertensives today.  Suspect this is why blood pressure is high.  Encourage patient to take her medications when she gets home and to continue to monitor her blood pressure on a regular basis.  Discussed with patient signs and symptoms that would warrant her reporting to the emergency department for further evaluation and treatment.  Patient and her granddaughter (who was present) expressed understanding. Again S/L NTG was explained how to and when to use it and to notify us if there is change in frequency of use.  Follow-up in 6 weeks, or sooner as needed.   Patient was seen in collaboration with Dr. Einar Gip. He also reviewed patient's chart and Dr. Einar Gip is in agreement of the plan.    This was a 40-minute encounter with face-to-face counseling, medical records review, coordination of care, explanation of complex medical issues, complex medical decision making.   Alethia Berthold, PA-C 10/09/2019, 3:15 PM Office: [phone]

## 2019-10-10 ENCOUNTER — Telehealth: Payer: Self-pay | Admitting: Student

## 2019-10-10 LAB — TROPONIN T: Troponin T (Highly Sensitive): 35 ng/L (ref 0–14)

## 2019-10-10 NOTE — Telephone Encounter (Signed)
Spoke with patient's daughter regarding troponin results from yesterday's visit.  Troponin trended down from 60-35.  Informed daughter this is reassuring that patient's pain and symptoms are not due to to cardiac etiology, she expressed understanding. Patient's daughter reports that the patient is feeling better today with use of muscle relaxer prescribed by PCP.

## 2019-10-15 DIAGNOSIS — N1831 Chronic kidney disease, stage 3a: Secondary | ICD-10-CM | POA: Diagnosis not present

## 2019-10-15 DIAGNOSIS — I119 Hypertensive heart disease without heart failure: Secondary | ICD-10-CM | POA: Diagnosis not present

## 2019-10-15 DIAGNOSIS — M25512 Pain in left shoulder: Secondary | ICD-10-CM | POA: Diagnosis not present

## 2019-10-15 DIAGNOSIS — E1149 Type 2 diabetes mellitus with other diabetic neurological complication: Secondary | ICD-10-CM | POA: Diagnosis not present

## 2019-10-23 DIAGNOSIS — M542 Cervicalgia: Secondary | ICD-10-CM | POA: Diagnosis not present

## 2019-10-23 DIAGNOSIS — M25522 Pain in left elbow: Secondary | ICD-10-CM | POA: Diagnosis not present

## 2019-10-23 DIAGNOSIS — M546 Pain in thoracic spine: Secondary | ICD-10-CM | POA: Diagnosis not present

## 2019-10-23 DIAGNOSIS — G5622 Lesion of ulnar nerve, left upper limb: Secondary | ICD-10-CM | POA: Diagnosis not present

## 2019-10-27 DIAGNOSIS — Z23 Encounter for immunization: Secondary | ICD-10-CM | POA: Diagnosis not present

## 2019-11-09 DIAGNOSIS — I480 Paroxysmal atrial fibrillation: Secondary | ICD-10-CM | POA: Diagnosis not present

## 2019-11-09 DIAGNOSIS — I6340 Cerebral infarction due to embolism of unspecified cerebral artery: Secondary | ICD-10-CM | POA: Diagnosis not present

## 2019-11-09 DIAGNOSIS — Z4509 Encounter for adjustment and management of other cardiac device: Secondary | ICD-10-CM | POA: Diagnosis not present

## 2019-11-21 ENCOUNTER — Ambulatory Visit: Payer: Medicare Other | Admitting: Cardiology

## 2019-11-21 ENCOUNTER — Other Ambulatory Visit: Payer: Self-pay

## 2019-11-21 ENCOUNTER — Encounter: Payer: Self-pay | Admitting: Cardiology

## 2019-11-21 VITALS — BP 116/41 | HR 65 | Resp 16 | Ht 60.0 in | Wt 172.0 lb

## 2019-11-21 DIAGNOSIS — I6523 Occlusion and stenosis of bilateral carotid arteries: Secondary | ICD-10-CM | POA: Diagnosis not present

## 2019-11-21 DIAGNOSIS — I480 Paroxysmal atrial fibrillation: Secondary | ICD-10-CM | POA: Diagnosis not present

## 2019-11-21 DIAGNOSIS — I5032 Chronic diastolic (congestive) heart failure: Secondary | ICD-10-CM

## 2019-11-21 DIAGNOSIS — R0609 Other forms of dyspnea: Secondary | ICD-10-CM | POA: Diagnosis not present

## 2019-11-21 DIAGNOSIS — N1832 Chronic kidney disease, stage 3b: Secondary | ICD-10-CM | POA: Diagnosis not present

## 2019-11-21 DIAGNOSIS — I25118 Atherosclerotic heart disease of native coronary artery with other forms of angina pectoris: Secondary | ICD-10-CM

## 2019-11-21 DIAGNOSIS — Z794 Long term (current) use of insulin: Secondary | ICD-10-CM | POA: Diagnosis not present

## 2019-11-21 DIAGNOSIS — E1122 Type 2 diabetes mellitus with diabetic chronic kidney disease: Secondary | ICD-10-CM

## 2019-11-21 DIAGNOSIS — R0600 Dyspnea, unspecified: Secondary | ICD-10-CM

## 2019-11-21 MED ORDER — FUROSEMIDE 20 MG PO TABS: 20.0000 mg | ORAL_TABLET | Freq: Every day | ORAL | 3 refills | Status: DC

## 2019-11-21 MED ORDER — POTASSIUM CHLORIDE CRYS ER 20 MEQ PO TBCR: 20.0000 meq | EXTENDED_RELEASE_TABLET | Freq: Every day | ORAL | 3 refills | Status: DC

## 2019-11-21 NOTE — Progress Notes (Signed)
Primary Physician/Referring:  Shon Baton, MD  Patient ID: Linda Oliver, female    DOB: 16-May-1932, 86 y.o.   MRN: [id number]  No chief complaint on file.  HPI:    Linda Oliver  is a 86 y.o. Caucasian female patient with CAD s/p CABG x 3 in 1996 in [place], HTN, hyperlipidemia, diabetes with PVD and sleep apnea on CPAP and now compliant, carotid stenosis, and history of  autonomic orthostatic hypotension, medial left occipital infarct in embolic pattern by MRI in 2020 and loop recorder implanted showed paroxysmal atrial fibrillation, for which she is now on Eliquis.  This is a 80-month office visit, and last seen her in September when she presented with left arm pain which appeared to be musculoskeletal although patient walked out from the emergency room due to the weight, troponin was mildly elevated.  She was also markedly hypertensive at that time.  She has had occasional episodes of sudden onset shortness of breath, 2 days ago she stated that she was extremely short of breath and did not feel well and also had cough.  She has chronic bilateral leg edema which she states is being stable.  She has been taking furosemide on a daily basis.  Accompanied by her daughter.  She has not had any chest pain or palpitations.  No dizziness or syncope.   Past Medical History:  Diagnosis Date  . Anginal pain ([place])    occ  . Anxiety   . Broken ankle   . Bullous pemphigus   . CAD (coronary artery disease)    [place]   80% stenosis ostial Left main, 30% stenosis proximal LAD, 80% stenosis proximal RCA CABG  -  LIMA to LAD, SVG to int, SVG to [place], [place]   . Depression   . Diabetic peripheral neuropathy ([place])   . Encounter for loop recorder check 04/11/2019  . GERD (gastroesophageal reflux disease)   . H/O: stroke: Left occipital stroke and vision loss 08/24/2018  . Headache(784.0)    hx migraines  . Heart murmur    mvp  . Hyperlipidemia   . Hypertension   . Hypertensive  heart disease   . Loop recorder Reveal 08/24/2018 04/11/2019   Scheduled Remote loop recorder check 03/10/2019: The presenting rhythm is sinus bradycardia. There were 0 symptomatic patient activations recorded. There were 275 AF episodes and 0 AT episodes recorded, representing a 4.2 % cumulative AT/AF burden. The longest episode lasted 00:03:40:00 in duration. Some false with PAC. There were 0 tachy episodes detected. There were 0 pause episodes detected. Th  . Nasal bleeding    occ due to O2  . OA (osteoarthritis)   . Obesity (BMI 30-39.9)   . Obstructive sleep apnea on CPAP   . Osteopenia   . Paroxysmal atrial fibrillation ([place]) 04/17/2019  . Peripheral neuropathy   . Seizure ([place])    2009  . Sleep apnea    dx 72yrs ago doesnt use cpap but uses O2 at nite 2L  . Stones in the urinary tract   . Type 2 diabetes mellitus with vascular disease ([place])   . UTI (lower urinary tract infection)    Past Surgical History:  Procedure Laterality Date  . ABDOMINAL HYSTERECTOMY  70's  . CARDIAC CATHETERIZATION    . CARPAL TUNNEL RELEASE Bilateral   . CATARACT EXTRACTION W/PHACO Bilateral   . CHOLECYSTECTOMY    . CORONARY ARTERY BYPASS GRAFT  96  . LOOP RECORDER INSERTION N/A 08/24/2018  Procedure: LOOP RECORDER INSERTION;  Surgeon: Constance Haw, MD;  Location: [facility];  Service: Cardiovascular;  Laterality: N/A;  . SHOULDER ARTHROSCOPY     rt  . SHOULDER ARTHROSCOPY WITH ROTATOR CUFF REPAIR AND SUBACROMIAL DECOMPRESSION  12/01/2011   Procedure: SHOULDER ARTHROSCOPY WITH ROTATOR CUFF REPAIR AND SUBACROMIAL DECOMPRESSION;  Surgeon: Nita Sells, MD;  Location: [facility];  Service: Orthopedics;  Laterality: Left;  shoulder debridement calcific tendonitis   Family History  Problem Relation Age of Onset  . Coronary artery disease Mother   . Heart disease Father   . Cancer Father        skin  . Cancer Brother   . Cancer - Colon Brother        brain, Lymphoma    Social  History   Tobacco Use  . Smoking status: Former Smoker    Packs/day: 1.00    Years: 30.00    Pack years: 30.00    Types: Cigarettes    Quit date: 11/25/1986    Years since quitting: 33.0  . Smokeless tobacco: Never Used  Substance Use Topics  . Alcohol use: No   Marital Status: Widowed  ROS  Review of Systems  Cardiovascular: Positive for dyspnea on exertion, irregular heartbeat (chronic) and leg swelling. Negative for chest pain, claudication, near-syncope, orthopnea, paroxysmal nocturnal dyspnea and syncope.  Respiratory: Positive for snoring (on CPAP). Negative for hemoptysis and wheezing.   Musculoskeletal: Positive for joint pain (left elbow).       Left arm pain radiating from the shoulder to the hand. Numbness of left fifth digit.  Left subscapular pain   Gastrointestinal: Negative for melena.  Genitourinary: Positive for bladder incontinence (chronic).  Neurological: Negative for dizziness.   Objective  Blood pressure (!) 116/41, pulse 65, resp. rate 16, height 5' (1.524 m), weight 172 lb (78 kg), SpO2 93 %.  Vitals with BMI 11/21/2019 10/09/2019 10/07/2019  Height 5\' 0"  5\' 0"  5\' 0"   Weight 172 lbs 169 lbs 167 lbs 2 oz  BMI 33.59 16.01 09.32  Systolic 355 732 -  Diastolic 41 90 -  Pulse 65 72 -     Physical Exam Constitutional:      General: She is not in acute distress.    Appearance: Normal appearance. She is well-developed. She is not diaphoretic.  Neck:     Vascular: No JVD.     Comments: Carotid bruit is present. Cardiovascular:     Rate and Rhythm: Normal rate and regular rhythm.     Pulses: Intact distal pulses.          Carotid pulses are 2+ on the right side with bruit and 2+ on the left side with bruit.      Radial pulses are 2+ on the right side and 2+ on the left side.       Posterior tibial pulses are 2+ on the right side and 2+ on the left side.     Heart sounds: Murmur heard.  Systolic murmur is present at the upper right sternal border.  No  gallop.      Comments: 1-2+ non-pitting ankle edema bilaterally.  No JVD.  Pulmonary:     Effort: Pulmonary effort is normal. No accessory muscle usage or respiratory distress.     Breath sounds: Normal breath sounds. No wheezing, rhonchi or rales.  Chest:     Chest wall: Tenderness (location of loop recorder ) present.  Abdominal:     General: Bowel sounds are normal.  Palpations: Abdomen is soft.     Tenderness: There is no abdominal tenderness.  Musculoskeletal:     Right shoulder: Normal.     Left shoulder: Tenderness (along scapula) present. No swelling or deformity. Decreased range of motion.     Right elbow: Normal.     Left elbow: No swelling or deformity. Decreased range of motion. Tenderness present in medial epicondyle.  Neurological:     Mental Status: She is alert.    Laboratory examination:   Recent Labs    05/10/19 1442 10/07/19 2012  NA 140 137  K 3.4* 4.3  CL 99 103  CO2 27 24  GLUCOSE 168* 251*  BUN 13 20  CREATININE 1.10* 1.33*  CALCIUM 8.5* 8.5*  GFRNONAA 46* 36*  GFRAA 53* 42*   CrCl cannot be calculated (Patient's most recent lab result is older than the maximum 21 days allowed.).  CMP Latest Ref Rng & Units 10/07/2019 05/10/2019 10/04/2018  Glucose 70 - 99 mg/dL 251(H) 168(H) 336(H)  BUN 8 - 23 mg/dL 20 13 17   Creatinine 0.44 - 1.00 mg/dL 1.33(H) 1.10(H) 1.27(H)  Sodium 135 - 145 mmol/L 137 140 137  Potassium 3.5 - 5.1 mmol/L 4.3 3.4(L) 4.2  Chloride 98 - 111 mmol/L 103 99 101  CO2 22 - 32 mmol/L 24 27 24   Calcium 8.9 - 10.3 mg/dL 8.5(L) 8.5(L) 8.7(L)  Total Protein 6.5 - 8.1 g/dL - - -  Total Bilirubin 0.3 - 1.2 mg/dL - - -  Alkaline Phos 38 - 126 U/L - - -  AST 15 - 41 U/L - - -  ALT 0 - 44 U/L - - -   CBC Latest Ref Rng & Units 10/07/2019 10/04/2018 08/23/2018  WBC 4.0 - 10.5 K/uL 7.2 9.3 8.6  Hemoglobin 12.0 - 15.0 g/dL 10.6(L) 11.2(L) 11.1(L)  Hematocrit 36 - 46 % 34.6(L) 37.5 34.7(L)  Platelets 150 - 400 K/uL 240 279 240   Lipid  Panel     Component Value Date/Time   CHOL 127 08/23/2018 0434   TRIG 358 (H) 08/23/2018 0434   HDL 27 (L) 08/23/2018 0434   CHOLHDL 4.7 08/23/2018 0434   VLDL 72 (H) 08/23/2018 0434   LDLCALC 28 08/23/2018 0434   HEMOGLOBIN A1C Lab Results  Component Value Date   HGBA1C 8.7 (H) 08/23/2018   MPG 202.99 08/23/2018   TSH No results for input(s): TSH in the last 8760 hours.   BNP (last 3 results) Recent Labs    05/10/19 1442  BNP 323.0*   External labs:  Cholesterol, total 127.000 08/23/2018 HDL 27.000 08/23/2018 LDL 28.000 08/23/2018 Triglycerides 358.000 08/23/2018  A1C 8.400 % 03/29/2019 TSH 1.940 04/01/2019  Hemoglobin 11.200 10/04/2018 INR 1.100 08/22/2018 Platelets 279.000 10/04/2018  Creatinine, Serum 1.000 mg/ 04/01/2019 Potassium 4.200 10/04/2018 Magnesium N/D ALT (SGPT) 12.000 uni 04/01/2019  Medications and allergies   Allergies  Allergen Reactions  . Percocet [Oxycodone-Acetaminophen] Nausea And Vomiting  . Latex Itching and Rash    EKG leads     Current Outpatient Medications  Medication Instructions  . acetaminophen (TYLENOL) 500 mg, Oral, Every 4 hours PRN  . apixaban (ELIQUIS) 5 mg, Oral, 2 times daily, Will need to see MD for future refills.  . B-D UF III MINI PEN NEEDLES 31G X 5 MM MISC SMARTSIG:1 Each SUB-Q Daily  . busPIRone (BUSPAR) 7.5 mg, Oral, 2 times daily  . DULoxetine (CYMBALTA) 60 mg, Oral, Daily  . ezetimibe (ZETIA) 10 mg, Oral, Daily  . fesoterodine (TOVIAZ) 8 mg, Oral,  Every evening  . fluticasone (FLONASE) 50 MCG/ACT nasal spray 1 spray, Each Nare, 2 times daily PRN  . FREESTYLE LITE test strip No dose, route, or frequency recorded.  . furosemide (LASIX) 20 mg, Oral, Daily  . gabapentin (NEURONTIN) 300 mg, Oral, 2 times daily  . glimepiride (AMARYL) 1 mg, Oral, Daily  . hydrOXYzine (ATARAX/VISTARIL) 50 mg, Oral, Every evening  . isosorbide dinitrate (ISORDIL) 30 mg, Oral, 3 times daily  . Lantus SoloStar 40 Units, Subcutaneous,  Daily before breakfast  . metoprolol succinate (TOPROL-XL) 12.5 mg, Oral, 2 times daily  . NAMZARIC 28-10 MG CP24 TAKE 1 CAPSULE DAILY  . nitroGLYCERIN (NITROSTAT) 0.4 mg, Sublingual, Every 5 min PRN  . pantoprazole (PROTONIX) 40 mg, Oral, Every evening  . potassium chloride SA (KLOR-CON) 20 MEQ tablet 20 mEq, Oral, Daily  . rosuvastatin (CRESTOR) 10 mg, Oral, Daily before supper  . sitaGLIPtin (JANUVIA) 100 mg, Oral, Daily  . valsartan (DIOVAN) 160 mg, Oral, Daily   Radiology:   CT Angio Neck 08/23/2018: 1. Left subclavian origin high-grade stenosis with delayed enhancement of the distal left vertebral artery. Additionally, there is irregular plaque/mural thrombus involving the aortic isthmus and left subclavian origin. 2. Left PCA branch occlusion correlating with the known acute infarct. 3. Cervical carotid atherosclerosis with up to 50% stenosis at the left ICA bulb. 4. 3 cm right thyroid mass, recommend elective ultrasound.  CXR PA/Lat 05/10/2019: The heart size and mediastinal contours are within normal limits. Prior CABG again noted. Aortic atherosclerosis incidentally noted. Cardiac loop recorder is seen anterior chest wall. Both lungs are clear. The visualized skeletal structures are unremarkable.  Chest x-ray 10/07/2019: Stable cardiomediastinal silhouette. No pneumothorax or pleural effusion is noted. Both lungs are clear. The visualized skeletal structures are unremarkable IMPRESSION: No active cardiopulmonary disease.  Cardiac Studies:   Coronary angiogram 1996: [place] 80% stenosis ostial Left main, 30% stenosis proximal LAD, 80% stenosis proximal RCA CABG  -  LIMA to LAD, SVG to int, SVG to [place], [place]   Lexiscan myoview stress test 10/24/2016: 1. Resting EKG shows T inversion in high lateral leads. There was <1 mm ST depression and T inversion in inferior and lateral leads, reverted to baseline immediately into recovery. Equivocal ischemic change due  to pharmacologic stress with lexiscan. Symptoms included dyspnea and dizziness.  2. Very subtle basal inferior small sized reversible defect suggestive of ischemia noted. Normal LVEF without wall motion abnormality at 72%. Low risk study.  Echocardiogram 03/22/2019:  1. Normal LV systolic function with visual EF 55-60%. Left ventricle cavity is normal in size. Moderate left ventricular hypertrophy. Normal global wall motion. No obvious regional wall motion abnormalities. Doppler evidence of grade II diastolic dysfunction, elevated LAP. Calculated EF 59%. 2. Left atrial cavity is mildly dilated. 3. Grossly, right atrial cavity is mildly dilated. 4. Moderate aortic regurgitation. 5. Mild to moderate mitral regurgitation. 6. Mild to moderate tricuspid regurgitation. Moderate pulmonary hypertension. RVSP measures 59 mmHg. 7. Mild pulmonic regurgitation. 8. Prior study dated 08/22/2017, LVEF of 60-65%, mild to moderate AR. Otherwise no significant change.   Carotid artery duplex 03/22/2019:  Stenosis in the right internal carotid artery (50-69%). Stenosis in the right external carotid artery (<50%).  Stenosis in the left internal carotid artery (16-49%). Stenosis in the left external carotid artery (<50%)  Antegrade right vertebral artery flow. Antegrade left vertebral artery flow.  Compared to the study done on 10/152020, no significant change. Follow up in six months is appropriate if clinically indicated.  Lexiscan Tetrofosmin Stress Test  05/15/2019: Nondiagnostic ECG stress. Myocardial perfusion is abnormal. Mild degree large extent perfusion defect consistent with moderate ischemia located in the apical inferior wall, mid inferior wall, mid inferoseptal wall, basal inferior wall and basal inferoseptal wall (Right Coronary Artery region) of left ventricle. Gated SPECT imaging of the left ventricle was normal,  demonstrating hypokinesis of the apical inferior wall, mid inferior wall and basal  inferior wall.  Stress LV EF is normal 55%.  Compared to the study done on 10/24/2016, previously minimal ischemia was evident and LVEF was 72%.  Low risk.  Device Clinic: Biotronic Loop recorder   Scheduled Remote loop recorder check 11/07/2019:  There were 201 AF episodes and 0 AT episodes recorded, representing a 1.0 % cumulative AT/AF burden. The longest episode lasted 1H52M in duration on 10/18/2019. There were 0 tachy episodes detected. There were 0 pause episodes detected. There were 0 brady episodes detected.   EKG  EKG 11/21/2019: Normal sinus rhythm at rate of 61 bpm, normal axis, incomplete right bundle branch block.  ST-T abnormality, cannot exclude inferior and lateral ischemia.  Normal QT interval.  EKG 10/09/2019 (external):ectopic atrial rhythm at rate of 70 bpm. Normal axis. Incomplete right bundle branch block. Lateral ST depression, unchanged from EKG 10/07/2019 as well as EKG 03/11/2019.  03/11/2019: Normal sinus rhythm with PAC's at 67 bpm, normal axis, IRBBB, lateral ST depression, cannot exclude ischemia, unchanged from EKG in June 2019.  Assessment     ICD-10-CM   1. Paroxysmal atrial fibrillation (HCC)  I48.0 EKG 12-Lead  2. Coronary artery disease of native artery of native heart with stable angina pectoris ([place])  I25.118 PCV ECHOCARDIOGRAM COMPLETE  3. Dyspnea on exertion  R06.00 PCV ECHOCARDIOGRAM COMPLETE  4. Type 2 diabetes mellitus with stage 3b chronic kidney disease, with long-term current use of insulin (HCC)  E11.22    N18.32    Z79.4   5. Chronic diastolic (congestive) heart failure (HCC)  I50.32 furosemide (LASIX) 20 MG tablet    potassium chloride SA (KLOR-CON) 20 MEQ tablet    PCV ECHOCARDIOGRAM COMPLETE  6. Carotid stenosis, asymptomatic, bilateral  I65.23 PCV CAROTID DUPLEX (BILATERAL)    Meds ordered this encounter  Medications  . furosemide (LASIX) 20 MG tablet    Sig: Take 1 tablet (20 mg total) by mouth daily.    Dispense:  90 tablet     Refill:  3  . potassium chloride SA (KLOR-CON) 20 MEQ tablet    Sig: Take 1 tablet (20 mEq total) by mouth daily.    Dispense:  90 tablet    Refill:  3    Medications Discontinued During This Encounter  Medication Reason  . cephALEXin (KEFLEX) 500 MG capsule Completed Course  . cyclobenzaprine (FLEXERIL) 10 MG tablet No longer needed (for PRN medications)  . ondansetron (ZOFRAN-ODT) 4 MG disintegrating tablet No longer needed (for PRN medications)  . traMADol (ULTRAM) 50 MG tablet No longer needed (for PRN medications)  . furosemide (LASIX) 20 MG tablet Reorder  . potassium chloride SA (KLOR-CON) 20 MEQ tablet Reorder     Recommendations:   MYLENA SEDBERRY  is a 86 y.o. Caucasian female patient with CAD s/p CABG x 3 in 1996 in [place], HTN, hyperlipidemia, diabetes with PVD and sleep apnea on CPAP and now compliant, carotid stenosis, and history of  autonomic orthostatic hypotension, medial left occipital infarct in embolic pattern by MRI in 2020 and loop recorder implanted showed paroxysmal atrial fibrillation, for which she is  now on Eliquis.  Last seen in the emergency room on 10/07/2019 for left arm pain which appeared to be musculoskeletal, this is resolved.  Troponins were mildly elevated without any new EKG abnormalities.  She now presents for 77-month office visit.  She has had paroxysmal episodes of acute dyspnea, suspect this could have been related to atrial fibrillation.  Advised her to transmit her loop during episode of dyspnea to see whether it correlates with atrial fibrillation.  However it could also be related to underlying chronic diastolic heart failure as well.  She does have mild valvular disease.  I will repeat an echocardiogram.  She has had abnormal nuclear stress test, however in view of advanced age and also chronic kidney disease stage III, cardiac catheterization is not being performed as per her wish and daughter's wish.  She was also on valsartan previously, it was  held, she could potentially benefit from being back on it but at a lower dose in view of underlying diabetes and chronic kidney disease.  She does have moderate carotid artery disease.  I will recheck her carotid artery duplex.  Unless she has any other concerns or problems I will see her back in 6 months.  I refilled her furosemide and potassium supplements that she takes for chronic diastolic heart failure.  This was a 40-minute office visit encounter.   Adrian Prows, MD, [facility] 11/21/2019, 11:15 AM Office: [phone] Pager: [phone]

## 2019-11-27 ENCOUNTER — Ambulatory Visit: Payer: Medicare Other

## 2019-11-27 ENCOUNTER — Other Ambulatory Visit: Payer: Self-pay

## 2019-11-27 DIAGNOSIS — I6523 Occlusion and stenosis of bilateral carotid arteries: Secondary | ICD-10-CM | POA: Diagnosis not present

## 2019-11-27 DIAGNOSIS — R0600 Dyspnea, unspecified: Secondary | ICD-10-CM

## 2019-11-27 DIAGNOSIS — R0609 Other forms of dyspnea: Secondary | ICD-10-CM | POA: Diagnosis not present

## 2019-11-27 DIAGNOSIS — I5032 Chronic diastolic (congestive) heart failure: Secondary | ICD-10-CM | POA: Diagnosis not present

## 2019-11-27 DIAGNOSIS — I25118 Atherosclerotic heart disease of native coronary artery with other forms of angina pectoris: Secondary | ICD-10-CM | POA: Diagnosis not present

## 2019-11-29 ENCOUNTER — Other Ambulatory Visit: Payer: Self-pay | Admitting: Cardiology

## 2019-11-29 DIAGNOSIS — I6523 Occlusion and stenosis of bilateral carotid arteries: Secondary | ICD-10-CM

## 2019-12-10 DIAGNOSIS — Z4509 Encounter for adjustment and management of other cardiac device: Secondary | ICD-10-CM | POA: Diagnosis not present

## 2019-12-10 DIAGNOSIS — I6340 Cerebral infarction due to embolism of unspecified cerebral artery: Secondary | ICD-10-CM | POA: Diagnosis not present

## 2019-12-10 DIAGNOSIS — I480 Paroxysmal atrial fibrillation: Secondary | ICD-10-CM | POA: Diagnosis not present

## 2019-12-10 DIAGNOSIS — Z95818 Presence of other cardiac implants and grafts: Secondary | ICD-10-CM | POA: Diagnosis not present

## 2019-12-16 DIAGNOSIS — Z23 Encounter for immunization: Secondary | ICD-10-CM | POA: Diagnosis not present

## 2019-12-31 DIAGNOSIS — R4189 Other symptoms and signs involving cognitive functions and awareness: Secondary | ICD-10-CM | POA: Diagnosis not present

## 2019-12-31 DIAGNOSIS — R0609 Other forms of dyspnea: Secondary | ICD-10-CM | POA: Diagnosis not present

## 2019-12-31 DIAGNOSIS — Z95818 Presence of other cardiac implants and grafts: Secondary | ICD-10-CM | POA: Diagnosis not present

## 2019-12-31 DIAGNOSIS — I4891 Unspecified atrial fibrillation: Secondary | ICD-10-CM | POA: Diagnosis not present

## 2019-12-31 DIAGNOSIS — E1149 Type 2 diabetes mellitus with other diabetic neurological complication: Secondary | ICD-10-CM | POA: Diagnosis not present

## 2019-12-31 DIAGNOSIS — I4510 Unspecified right bundle-branch block: Secondary | ICD-10-CM | POA: Diagnosis not present

## 2019-12-31 DIAGNOSIS — N1831 Chronic kidney disease, stage 3a: Secondary | ICD-10-CM | POA: Diagnosis not present

## 2019-12-31 DIAGNOSIS — I119 Hypertensive heart disease without heart failure: Secondary | ICD-10-CM | POA: Diagnosis not present

## 2019-12-31 DIAGNOSIS — I6990 Unspecified sequelae of unspecified cerebrovascular disease: Secondary | ICD-10-CM | POA: Diagnosis not present

## 2019-12-31 DIAGNOSIS — I6529 Occlusion and stenosis of unspecified carotid artery: Secondary | ICD-10-CM | POA: Diagnosis not present

## 2019-12-31 DIAGNOSIS — I2510 Atherosclerotic heart disease of native coronary artery without angina pectoris: Secondary | ICD-10-CM | POA: Diagnosis not present

## 2019-12-31 DIAGNOSIS — I700 Atherosclerosis of aorta: Secondary | ICD-10-CM | POA: Diagnosis not present

## 2020-01-01 ENCOUNTER — Telehealth (INDEPENDENT_AMBULATORY_CARE_PROVIDER_SITE_OTHER): Payer: Medicare Other | Admitting: Adult Health

## 2020-01-01 DIAGNOSIS — I6523 Occlusion and stenosis of bilateral carotid arteries: Secondary | ICD-10-CM | POA: Diagnosis not present

## 2020-01-01 DIAGNOSIS — G4733 Obstructive sleep apnea (adult) (pediatric): Secondary | ICD-10-CM | POA: Diagnosis not present

## 2020-01-01 DIAGNOSIS — Z9989 Dependence on other enabling machines and devices: Secondary | ICD-10-CM

## 2020-01-01 NOTE — Progress Notes (Signed)
PATIENT: Linda Oliver DOB: Jan 25, 1932  REASON FOR VISIT: follow up HISTORY FROM: patient  Virtual Visit via Video Note  I connected with Linda Oliver on 01/01/20 at  3:00 PM EST by a video enabled telemedicine application located remotely at [facility] and verified that I am speaking with the correct person using two identifiers who was located at their own home.   I discussed the limitations of evaluation and management by telemedicine and the availability of in person appointments. The patient expressed understanding and agreed to proceed.   PATIENT: Linda Oliver DOB: 1932-09-02  REASON FOR VISIT: follow up HISTORY FROM: patient  HISTORY OF PRESENT ILLNESS: Today 01/01/20:  Ms. Rohrer is an 86 year old female with a history of obstructive sleep apnea on CPAP.  Her download indicates that she use the machine nightly for compliance of 100%.  He uses machine greater than 4 hours each night.  On average she uses her machine 9 hours and 16 minutes.  Residual AHI is 0.4 on 6 to 13 cm of water with EPR 3.  Leak in the 95th percentile is 26 L/min.  She states that she has been unable to get supplies from her DME company.  She also states that she bought her own mask that she prefers to use.  She returns today for follow-up.  HISTORY 01/01/19:  Ms. Willy is an 86 year old female with a history of obstructive sleep apnea on CPAP.  Her download indicates that she used her machine 12 out of 30 days for compliance of 40%.  She used her machine greater than 4 hours each night.  She states that she has been using her machine nightly.  Her download shows that from November 21 to December 8 no data.  We will call her DME company to inquire about this.  On average she uses her machine 8 hours and 26 minutes.  Her residual AHI is 0.7 on 6 to 13 cm of water.  She states that her DME company never sent her new supplies so she got her own mask online.  She states that this mask is  working better for her.  She would like more supplies from her DME company.  REVIEW OF SYSTEMS: Out of a complete 14 system review of symptoms, the patient complains only of the following symptoms, and all other reviewed systems are negative.  See HPI  ALLERGIES: Allergies  Allergen Reactions  . Percocet [Oxycodone-Acetaminophen] Nausea And Vomiting  . Latex Itching and Rash    EKG leads    HOME MEDICATIONS: Outpatient Medications Prior to Visit  Medication Sig Dispense Refill  . acetaminophen (TYLENOL) 500 MG tablet Take 500 mg by mouth every 4 (four) hours as needed for moderate pain.    Marland Kitchen apixaban (ELIQUIS) 5 MG TABS tablet Take 1 tablet (5 mg total) by mouth 2 (two) times daily. Will need to see MD for future refills. 180 tablet 1  . B-D UF III MINI PEN NEEDLES 31G X 5 MM MISC SMARTSIG:1 Each SUB-Q Daily    . busPIRone (BUSPAR) 7.5 MG tablet Take 7.5 mg by mouth 2 (two) times daily.    . DULoxetine (CYMBALTA) 60 MG capsule Take 60 mg by mouth daily.    Marland Kitchen ezetimibe (ZETIA) 10 MG tablet Take 10 mg by mouth daily.    . fesoterodine (TOVIAZ) 8 MG TB24 tablet Take 8 mg by mouth every evening.     . fluticasone (FLONASE) 50 MCG/ACT nasal spray Place  1 spray into both nostrils 2 (two) times daily as needed for allergies or rhinitis.    Marland Kitchen FREESTYLE LITE test strip     . furosemide (LASIX) 20 MG tablet Take 1 tablet (20 mg total) by mouth daily. 90 tablet 3  . gabapentin (NEURONTIN) 300 MG capsule Take 300 mg by mouth 2 (two) times daily.    Marland Kitchen glimepiride (AMARYL) 1 MG tablet Take 1 mg by mouth daily.    . hydrOXYzine (ATARAX/VISTARIL) 25 MG tablet Take 50 mg by mouth every evening.     . Insulin Glargine (LANTUS SOLOSTAR) 100 UNIT/ML Solostar Pen Inject 40 Units into the skin daily before breakfast.     . isosorbide dinitrate (ISORDIL) 30 MG tablet Take 1 tablet (30 mg total) by mouth 3 (three) times daily. 270 tablet 3  . metoprolol succinate (TOPROL-XL) 25 MG 24 hr tablet Take 12.5 mg  by mouth 2 (two) times daily.    Marland Kitchen NAMZARIC 28-10 MG CP24 TAKE 1 CAPSULE DAILY 90 capsule 1  . nitroGLYCERIN (NITROSTAT) 0.4 MG SL tablet Place 1 tablet (0.4 mg total) under the tongue every 5 (five) minutes as needed for up to 25 days for chest pain. 25 tablet 3  . pantoprazole (PROTONIX) 40 MG tablet Take 40 mg by mouth every evening.     . potassium chloride SA (KLOR-CON) 20 MEQ tablet Take 1 tablet (20 mEq total) by mouth daily. 90 tablet 3  . rosuvastatin (CRESTOR) 10 MG tablet Take 10 mg by mouth daily before supper.    . sitaGLIPtin (JANUVIA) 100 MG tablet Take 100 mg by mouth daily.    . valsartan (DIOVAN) 160 MG tablet Take 1 tablet (160 mg total) by mouth daily. (Patient not taking: Reported on 11/21/2019) 90 tablet 2   No facility-administered medications prior to visit.    PAST MEDICAL HISTORY: Past Medical History:  Diagnosis Date  . Anginal pain ([place])    occ  . Anxiety   . Broken ankle   . Bullous pemphigus   . CAD (coronary artery disease)    [place]   80% stenosis ostial Left main, 30% stenosis proximal LAD, 80% stenosis proximal RCA CABG  -  LIMA to LAD, SVG to int, SVG to [place], [place]   . Depression   . Diabetic peripheral neuropathy ([place])   . Encounter for loop recorder check 04/11/2019  . GERD (gastroesophageal reflux disease)   . H/O: stroke: Left occipital stroke and vision loss 08/24/2018  . Headache(784.0)    hx migraines  . Heart murmur    mvp  . Hyperlipidemia   . Hypertension   . Hypertensive heart disease   . Loop recorder Reveal 08/24/2018 04/11/2019   Scheduled Remote loop recorder check 03/10/2019: The presenting rhythm is sinus bradycardia. There were 0 symptomatic patient activations recorded. There were 275 AF episodes and 0 AT episodes recorded, representing a 4.2 % cumulative AT/AF burden. The longest episode lasted 00:03:40:00 in duration. Some false with PAC. There were 0 tachy episodes detected. There were 0 pause episodes  detected. Th  . Nasal bleeding    occ due to O2  . OA (osteoarthritis)   . Obesity (BMI 30-39.9)   . Obstructive sleep apnea on CPAP   . Osteopenia   . Paroxysmal atrial fibrillation ([place]) 04/17/2019  . Peripheral neuropathy   . Seizure ([place])    2009  . Sleep apnea    dx 29yrs ago doesnt use cpap but uses O2  at nite 2L  . Stones in the urinary tract   . Type 2 diabetes mellitus with vascular disease ([place])   . UTI (lower urinary tract infection)     PAST SURGICAL HISTORY: Past Surgical History:  Procedure Laterality Date  . ABDOMINAL HYSTERECTOMY  70's  . CARDIAC CATHETERIZATION    . CARPAL TUNNEL RELEASE Bilateral   . CATARACT EXTRACTION W/PHACO Bilateral   . CHOLECYSTECTOMY    . CORONARY ARTERY BYPASS GRAFT  96  . LOOP RECORDER INSERTION N/A 08/24/2018   Procedure: LOOP RECORDER INSERTION;  Surgeon: Constance Haw, MD;  Location: [place] [facility];  Service: Cardiovascular;  Laterality: N/A;  . SHOULDER ARTHROSCOPY     rt  . SHOULDER ARTHROSCOPY WITH ROTATOR CUFF REPAIR AND SUBACROMIAL DECOMPRESSION  12/01/2011   Procedure: SHOULDER ARTHROSCOPY WITH ROTATOR CUFF REPAIR AND SUBACROMIAL DECOMPRESSION;  Surgeon: Nita Sells, MD;  Location: [place];  Service: Orthopedics;  Laterality: Left;  shoulder debridement calcific tendonitis    FAMILY HISTORY: Family History  Problem Relation Age of Onset  . Coronary artery disease Mother   . Heart disease Father   . Cancer Father        skin  . Cancer Brother   . Cancer - Colon Brother        brain, Lymphoma    SOCIAL HISTORY: Social History   Socioeconomic History  . Marital status: Widowed    Spouse name: Not on file  . Number of children: 1  . Years of education: 65  . Highest education level: Not on file  Occupational History  . Not on file  Tobacco Use  . Smoking status: Former Smoker    Packs/day: 1.00    Years: 30.00    Pack years: 30.00    Types: Cigarettes    Quit date: 11/25/1986    Years  since quitting: 33.1  . Smokeless tobacco: Never Used  Vaping Use  . Vaping Use: Never used  Substance and Sexual Activity  . Alcohol use: No  . Drug use: No  . Sexual activity: Yes    Birth control/protection: Post-menopausal  Other Topics Concern  . Not on file  Social History Narrative   Married, has 1 child   Caffeine use: none   Social Determinants of Radio broadcast assistant Strain: Not on file  Food Insecurity: Not on file  Transportation Needs: Not on file  Physical Activity: Not on file  Stress: Not on file  Social Connections: Not on file  Intimate Partner Violence: Not on file      PHYSICAL EXAM Generalized: Well developed, in no acute distress   Neurological examination  Mentation: Alert oriented to time, place, history taking. Follows all commands speech and language fluent Cranial nerve II-XII:Extraocular movements were full. Facial symmetry noted. Head turning and shoulder shrug  were normal and symmetric. Motor: Good strength throughout subjectively per patient Sensory: Sensory testing is intact to soft touch on all 4 extremities subjectively per patient Coordination: Cerebellar testing reveals good finger-nose-finger  Gait and station: seated Reflexes: UTA  DIAGNOSTIC DATA (LABS, IMAGING, TESTING) - I reviewed patient records, labs, notes, testing and imaging myself where available.  Lab Results  Component Value Date   WBC 7.2 10/07/2019   HGB 10.6 (L) 10/07/2019   HCT 34.6 (L) 10/07/2019   MCV 91.3 10/07/2019   PLT 240 10/07/2019      Component Value Date/Time   NA 137 10/07/2019 2012   NA 140 05/10/2019 1442  K 4.3 10/07/2019 2012   CL 103 10/07/2019 2012   CO2 24 10/07/2019 2012   GLUCOSE 251 (H) 10/07/2019 2012   BUN 20 10/07/2019 2012   BUN 13 05/10/2019 1442   CREATININE 1.33 (H) 10/07/2019 2012   CALCIUM 8.5 (L) 10/07/2019 2012   PROT 6.0 (L) 08/23/2018 0434   PROT 6.8 09/21/2016 0818   ALBUMIN 2.9 (L) 08/23/2018 0434    ALBUMIN 3.9 09/21/2016 0818   AST 15 08/23/2018 0434   ALT 12 08/23/2018 0434   ALKPHOS 100 08/23/2018 0434   BILITOT 0.4 08/23/2018 0434   BILITOT 0.4 09/21/2016 0818   GFRNONAA 36 (L) 10/07/2019 2012   GFRAA 42 (L) 10/07/2019 2012   Lab Results  Component Value Date   CHOL 127 08/23/2018   HDL 27 (L) 08/23/2018   LDLCALC 28 08/23/2018   TRIG 358 (H) 08/23/2018   CHOLHDL 4.7 08/23/2018   Lab Results  Component Value Date   HGBA1C 8.7 (H) 08/23/2018   Lab Results  Component Value Date   VITAMINB12 >2000 (H) 09/21/2016   Lab Results  Component Value Date   TSH 3.040 09/21/2016      ASSESSMENT AND PLAN 87 y.o. year old female  has a past medical history of Anginal pain ([place]), Anxiety, Broken ankle, Bullous pemphigus, CAD (coronary artery disease), Depression, Diabetic peripheral neuropathy ([place]), Encounter for loop recorder check (04/11/2019), GERD (gastroesophageal reflux disease), H/O: stroke: Left occipital stroke and vision loss (08/24/2018), Headache(784.0), Heart murmur, Hyperlipidemia, Hypertension, Hypertensive heart disease, Loop recorder Reveal 08/24/2018 (04/11/2019), Nasal bleeding, OA (osteoarthritis), Obesity (BMI 30-39.9), Obstructive sleep apnea on CPAP, Osteopenia, Paroxysmal atrial fibrillation ([place]) (04/17/2019), Peripheral neuropathy, Seizure ([place]), Sleep apnea, Stones in the urinary tract, Type 2 diabetes mellitus with vascular disease ([place]), and UTI (lower urinary tract infection). here with:  OSA on CPAP  . CPAP compliance excellent . Residual AHI is good . Encouraged patient to continue using CPAP nightly and > 4 hours each night . F/U in 1 year or sooner if needed  I spent 20 minutes of face-to-face and non-face-to-face time with patient.  This included previsit chart review, lab review, study review, order entry, electronic health record documentation, patient education.  Ward Givens, MSN, NP-C 01/01/2020, 2:53 PM [facility] [address] [place], [place] [address] [phone]

## 2020-01-07 NOTE — Progress Notes (Signed)
community message sent for cpap orders

## 2020-01-10 DIAGNOSIS — I480 Paroxysmal atrial fibrillation: Secondary | ICD-10-CM | POA: Diagnosis not present

## 2020-01-10 DIAGNOSIS — I6340 Cerebral infarction due to embolism of unspecified cerebral artery: Secondary | ICD-10-CM | POA: Diagnosis not present

## 2020-01-10 DIAGNOSIS — Z95818 Presence of other cardiac implants and grafts: Secondary | ICD-10-CM | POA: Diagnosis not present

## 2020-01-10 DIAGNOSIS — Z4509 Encounter for adjustment and management of other cardiac device: Secondary | ICD-10-CM | POA: Diagnosis not present

## 2020-02-03 ENCOUNTER — Other Ambulatory Visit: Payer: Self-pay | Admitting: Neurology

## 2020-02-06 ENCOUNTER — Encounter (INDEPENDENT_AMBULATORY_CARE_PROVIDER_SITE_OTHER): Payer: Medicare Other | Admitting: Ophthalmology

## 2020-02-10 DIAGNOSIS — I480 Paroxysmal atrial fibrillation: Secondary | ICD-10-CM | POA: Diagnosis not present

## 2020-02-10 DIAGNOSIS — Z4509 Encounter for adjustment and management of other cardiac device: Secondary | ICD-10-CM | POA: Diagnosis not present

## 2020-02-10 DIAGNOSIS — I6340 Cerebral infarction due to embolism of unspecified cerebral artery: Secondary | ICD-10-CM | POA: Diagnosis not present

## 2020-02-10 DIAGNOSIS — Z95818 Presence of other cardiac implants and grafts: Secondary | ICD-10-CM | POA: Diagnosis not present

## 2020-02-18 ENCOUNTER — Other Ambulatory Visit: Payer: Self-pay | Admitting: Cardiology

## 2020-02-19 NOTE — Telephone Encounter (Signed)
From pt

## 2020-03-03 ENCOUNTER — Other Ambulatory Visit: Payer: Self-pay

## 2020-03-03 ENCOUNTER — Encounter (INDEPENDENT_AMBULATORY_CARE_PROVIDER_SITE_OTHER): Payer: Self-pay | Admitting: Ophthalmology

## 2020-03-03 ENCOUNTER — Ambulatory Visit (INDEPENDENT_AMBULATORY_CARE_PROVIDER_SITE_OTHER): Payer: Medicare Other | Admitting: Ophthalmology

## 2020-03-03 DIAGNOSIS — H35371 Puckering of macula, right eye: Secondary | ICD-10-CM

## 2020-03-03 DIAGNOSIS — H43812 Vitreous degeneration, left eye: Secondary | ICD-10-CM

## 2020-03-03 DIAGNOSIS — H43811 Vitreous degeneration, right eye: Secondary | ICD-10-CM

## 2020-03-03 DIAGNOSIS — Z9889 Other specified postprocedural states: Secondary | ICD-10-CM

## 2020-03-03 NOTE — Assessment & Plan Note (Signed)
Minor, no topographic distortion

## 2020-03-03 NOTE — Progress Notes (Signed)
03/03/2020     CHIEF COMPLAINT Patient presents for Retina Follow Up (6 Month f\u OU. OCT/Pt states she is having trouble seeing out of OU. States vision is weak. Pt sees her normal floaters/BGL: 153 this AM/)   HISTORY OF PRESENT ILLNESS: Linda Oliver is a 87 y.o. female who presents to the clinic today for:   HPI    Retina Follow Up    Patient presents with  PVD.  In both eyes.  Severity is moderate.  Duration of 6 months.  Since onset it is stable. Additional comments: 6 Month f\u OU. OCT Pt states she is having trouble seeing out of OU. States vision is weak. Pt sees her normal floaters BGL: 153 this AM        Last edited by Tilda Franco on 03/03/2020 10:25 AM. (History)      Referring physician: Shon Baton, MD [address] [place],  [place] [address]  HISTORICAL INFORMATION:   Selected notes from the MEDICAL RECORD NUMBER    Lab Results  Component Value Date   HGBA1C 8.7 (H) 08/23/2018     CURRENT MEDICATIONS: No current outpatient medications on file. (Ophthalmic Drugs)   No current facility-administered medications for this visit. (Ophthalmic Drugs)   Current Outpatient Medications (Other)  Medication Sig  . acetaminophen (TYLENOL) 500 MG tablet Take 500 mg by mouth every 4 (four) hours as needed for moderate pain.  Marland Kitchen apixaban (ELIQUIS) 5 MG TABS tablet Take 1 tablet (5 mg total) by mouth 2 (two) times daily. Will need to see MD for future refills.  . B-D UF III MINI PEN NEEDLES 31G X 5 MM MISC SMARTSIG:1 Each SUB-Q Daily  . busPIRone (BUSPAR) 7.5 MG tablet Take 7.5 mg by mouth 2 (two) times daily.  . DULoxetine (CYMBALTA) 60 MG capsule Take 60 mg by mouth daily.  Marland Kitchen ezetimibe (ZETIA) 10 MG tablet Take 10 mg by mouth daily.  . fesoterodine (TOVIAZ) 8 MG TB24 tablet Take 8 mg by mouth every evening.   . fluticasone (FLONASE) 50 MCG/ACT nasal spray Place 1 spray into both nostrils 2 (two) times daily as needed for allergies or rhinitis.  Marland Kitchen FREESTYLE LITE  test strip   . furosemide (LASIX) 20 MG tablet Take 1 tablet (20 mg total) by mouth daily.  Marland Kitchen gabapentin (NEURONTIN) 300 MG capsule Take 300 mg by mouth 2 (two) times daily.  Marland Kitchen glimepiride (AMARYL) 1 MG tablet Take 1 mg by mouth daily.  . hydrOXYzine (ATARAX/VISTARIL) 25 MG tablet Take 50 mg by mouth every evening.   . Insulin Glargine (LANTUS SOLOSTAR) 100 UNIT/ML Solostar Pen Inject 40 Units into the skin daily before breakfast.   . isosorbide dinitrate (ISORDIL) 30 MG tablet Take 1 tablet (30 mg total) by mouth 3 (three) times daily.  . metoprolol succinate (TOPROL-XL) 25 MG 24 hr tablet Take 12.5 mg by mouth 2 (two) times daily.  Marland Kitchen NAMZARIC 28-10 MG CP24 TAKE 1 CAPSULE DAILY  . nitroGLYCERIN (NITROSTAT) 0.4 MG SL tablet Place 1 tablet (0.4 mg total) under the tongue every 5 (five) minutes as needed for up to 25 days for chest pain.  . pantoprazole (PROTONIX) 40 MG tablet Take 40 mg by mouth every evening.   . potassium chloride SA (KLOR-CON) 20 MEQ tablet Take 1 tablet (20 mEq total) by mouth daily.  . rosuvastatin (CRESTOR) 10 MG tablet Take 10 mg by mouth daily before supper.  . sitaGLIPtin (JANUVIA) 100 MG tablet Take 100 mg by mouth  daily.  . valsartan (DIOVAN) 160 MG tablet Take 1 tablet (160 mg total) by mouth daily. (Patient not taking: Reported on 11/21/2019)   No current facility-administered medications for this visit. (Other)      REVIEW OF SYSTEMS:    ALLERGIES Allergies  Allergen Reactions  . Percocet [Oxycodone-Acetaminophen] Nausea And Vomiting  . Latex Itching and Rash    EKG leads    PAST MEDICAL HISTORY Past Medical History:  Diagnosis Date  . Anginal pain ([place])    occ  . Anxiety   . Broken ankle   . Bullous pemphigus   . CAD (coronary artery disease)    [place]   80% stenosis ostial Left main, 30% stenosis proximal LAD, 80% stenosis proximal RCA CABG  -  LIMA to LAD, SVG to int, SVG to [place], [place]   . Depression   .  Diabetic peripheral neuropathy ([place])   . Encounter for loop recorder check 04/11/2019  . GERD (gastroesophageal reflux disease)   . H/O: stroke: Left occipital stroke and vision loss 08/24/2018  . Headache(784.0)    hx migraines  . Heart murmur    mvp  . Hyperlipidemia   . Hypertension   . Hypertensive heart disease   . Loop recorder Reveal 08/24/2018 04/11/2019   Scheduled Remote loop recorder check 03/10/2019: The presenting rhythm is sinus bradycardia. There were 0 symptomatic patient activations recorded. There were 275 AF episodes and 0 AT episodes recorded, representing a 4.2 % cumulative AT/AF burden. The longest episode lasted 00:03:40:00 in duration. Some false with PAC. There were 0 tachy episodes detected. There were 0 pause episodes detected. Th  . Nasal bleeding    occ due to O2  . OA (osteoarthritis)   . Obesity (BMI 30-39.9)   . Obstructive sleep apnea on CPAP   . Osteopenia   . Paroxysmal atrial fibrillation ([place]) 04/17/2019  . Peripheral neuropathy   . Seizure ([place])    2009  . Sleep apnea    dx 3yrs ago doesnt use cpap but uses O2 at nite 2L  . Stones in the urinary tract   . Type 2 diabetes mellitus with vascular disease ([place])   . UTI (lower urinary tract infection)    Past Surgical History:  Procedure Laterality Date  . ABDOMINAL HYSTERECTOMY  70's  . CARDIAC CATHETERIZATION    . CARPAL TUNNEL RELEASE Bilateral   . CATARACT EXTRACTION W/PHACO Bilateral   . CHOLECYSTECTOMY    . CORONARY ARTERY BYPASS GRAFT  96  . LOOP RECORDER INSERTION N/A 08/24/2018   Procedure: LOOP RECORDER INSERTION;  Surgeon: Constance Haw, MD;  Location: [facility];  Service: Cardiovascular;  Laterality: N/A;  . SHOULDER ARTHROSCOPY     rt  . SHOULDER ARTHROSCOPY WITH ROTATOR CUFF REPAIR AND SUBACROMIAL DECOMPRESSION  12/01/2011   Procedure: SHOULDER ARTHROSCOPY WITH ROTATOR CUFF REPAIR AND SUBACROMIAL DECOMPRESSION;  Surgeon: Nita Sells, MD;  Location: [facility];   Service: Orthopedics;  Laterality: Left;  shoulder debridement calcific tendonitis    FAMILY HISTORY Family History  Problem Relation Age of Onset  . Coronary artery disease Mother   . Heart disease Father   . Cancer Father        skin  . Cancer Brother   . Cancer - Colon Brother        brain, Lymphoma    SOCIAL HISTORY Social History   Tobacco Use  . Smoking status: Former Smoker    Packs/day: 1.00  Years: 30.00    Pack years: 30.00    Types: Cigarettes    Quit date: 11/25/1986    Years since quitting: 33.2  . Smokeless tobacco: Never Used  Vaping Use  . Vaping Use: Never used  Substance Use Topics  . Alcohol use: No  . Drug use: No         OPHTHALMIC EXAM: Base Eye Exam    Visual Acuity (Snellen - Linear)      Right Left   Dist cc 20/30 20/80 -2   Dist ph cc  20/60 -2       Tonometry (Tonopen, 10:29 AM)      Right Left   Pressure 13 13       Pupils      Pupils Dark Light Shape React APD   Right PERRL 4 3 Round Brisk None   Left PERRL 4 3 Round Brisk None       Visual Fields (Counting fingers)      Left Right    Full Full       Neuro/Psych    Oriented x3: Yes   Mood/Affect: Normal       Dilation    Both eyes: 1.0% Mydriacyl, 2.5% Phenylephrine @ 10:29 AM        Slit Lamp and Fundus Exam    External Exam      Right Left   External Normal Normal       Slit Lamp Exam      Right Left   Lids/Lashes Normal Normal   Conjunctiva/Sclera White and quiet White and quiet   Cornea Clear Clear   Anterior Chamber Deep and quiet Deep and quiet   Iris Round and reactive Round and reactive   Lens Posterior chamber intraocular lens, Open posterior capsule Posterior chamber intraocular lens,, with heme in the cul de sac of capsule   Anterior Vitreous Normal Normal       Fundus Exam      Right Left   Posterior Vitreous Posterior vitreous detachment AVITRIC   Disc Normal Drusen   C/D Ratio 0.35 0.55   Macula Epiretinal membrane seen on OCT is  not clinically evident Old macular hole, close negative Watzke   Vessels Normal, no DR Normal, no DR   Periphery Normal Normal, no holes or tears          IMAGING AND PROCEDURES  Imaging and Procedures for 03/03/20  OCT, Retina - OU - Both Eyes       Right Eye Quality was good. Scan locations included subfoveal. Central Foveal Thickness: 289. Progression has been stable. Findings include epiretinal membrane.   Left Eye Quality was good. Scan locations included subfoveal. Central Foveal Thickness: 280. Progression has been stable. Findings include abnormal foveal contour.   Notes No topographic distortion due to epiretinal membrane OD,  OS, history of macular hole status post vitrectomy, membrane peeling repair with excellent visual acuity.  Stable                ASSESSMENT/PLAN:  Right epiretinal membrane Minor, no topographic distortion  Posterior vitreous detachment of right eye No peripheral pathology      ICD-10-CM   1. Posterior vitreous detachment of right eye  H43.811 OCT, Retina - OU - Both Eyes  2. Posterior vitreous detachment of left eye  H43.812 OCT, Retina - OU - Both Eyes  3. History of vitrectomy  Z98.890   4. Right epiretinal membrane  H35.371     1.  No  progression of epiretinal membrane right eye.  Preservation of good acuity.  2.  OS with a history of macular hole of macular hole repair with much improved visual acuity, from 2014.  Able overall.  Pinhole acuity does improve slightly.    3.  Has complaints of persistent tearing on the surface of the eye, explained that this has to do with the aging of the eyelids and pooling of the surface tears and is not a disease  Ophthalmic Meds Ordered this visit:  No orders of the defined types were placed in this encounter.      Return in about 9 months (around 12/01/2020) for COLOR FP, OCT, DILATE OU.  There are no Patient Instructions on file for this visit.   Explained the diagnoses,  plan, and follow up with the patient and they expressed understanding.  Patient expressed understanding of the importance of proper follow up care.   Clent Demark Iyanna Drummer M.D. Diseases & Surgery of the Retina and Vitreous [facility] 03/03/20     Abbreviations: M myopia (nearsighted); A astigmatism; H hyperopia (farsighted); P presbyopia; Mrx spectacle prescription;  CTL contact lenses; OD right eye; OS left eye; OU both eyes  XT exotropia; ET esotropia; PEK punctate epithelial keratitis; PEE punctate epithelial erosions; DES dry eye syndrome; MGD meibomian gland dysfunction; ATs artificial tears; PFAT's preservative free artificial tears; [place] nuclear sclerotic cataract; PSC posterior subcapsular cataract; ERM epi-retinal membrane; PVD posterior vitreous detachment; RD retinal detachment; DM diabetes mellitus; DR diabetic retinopathy; NPDR non-proliferative diabetic retinopathy; PDR proliferative diabetic retinopathy; CSME clinically significant macular edema; DME diabetic macular edema; dbh dot blot hemorrhages; CWS cotton wool spot; POAG primary open angle glaucoma; C/D cup-to-disc ratio; HVF humphrey visual field; GVF goldmann visual field; OCT optical coherence tomography; IOP intraocular pressure; BRVO Branch retinal vein occlusion; CRVO central retinal vein occlusion; CRAO central retinal artery occlusion; BRAO branch retinal artery occlusion; RT retinal tear; SB scleral buckle; PPV pars plana vitrectomy; VH Vitreous hemorrhage; PRP panretinal laser photocoagulation; IVK intravitreal kenalog; VMT vitreomacular traction; MH Macular hole;  NVD neovascularization of the disc; NVE neovascularization elsewhere; AREDS age related eye disease study; ARMD age related macular degeneration; POAG primary open angle glaucoma; EBMD epithelial/anterior basement membrane dystrophy; ACIOL anterior chamber intraocular lens; IOL intraocular lens; PCIOL posterior chamber intraocular lens; Phaco/IOL  phacoemulsification with intraocular lens placement; [place] photorefractive keratectomy; LASIK laser assisted in situ keratomileusis; HTN hypertension; DM diabetes mellitus; COPD chronic obstructive pulmonary disease

## 2020-03-03 NOTE — Assessment & Plan Note (Signed)
No peripheral pathology

## 2020-03-12 DIAGNOSIS — Z95818 Presence of other cardiac implants and grafts: Secondary | ICD-10-CM | POA: Diagnosis not present

## 2020-03-12 DIAGNOSIS — I6340 Cerebral infarction due to embolism of unspecified cerebral artery: Secondary | ICD-10-CM | POA: Diagnosis not present

## 2020-03-12 DIAGNOSIS — Z4509 Encounter for adjustment and management of other cardiac device: Secondary | ICD-10-CM | POA: Diagnosis not present

## 2020-03-12 DIAGNOSIS — I480 Paroxysmal atrial fibrillation: Secondary | ICD-10-CM | POA: Diagnosis not present

## 2020-04-12 DIAGNOSIS — I480 Paroxysmal atrial fibrillation: Secondary | ICD-10-CM | POA: Diagnosis not present

## 2020-04-12 DIAGNOSIS — I6340 Cerebral infarction due to embolism of unspecified cerebral artery: Secondary | ICD-10-CM | POA: Diagnosis not present

## 2020-04-12 DIAGNOSIS — Z4509 Encounter for adjustment and management of other cardiac device: Secondary | ICD-10-CM | POA: Diagnosis not present

## 2020-04-20 ENCOUNTER — Other Ambulatory Visit: Payer: Self-pay | Admitting: Cardiology

## 2020-04-20 DIAGNOSIS — I10 Essential (primary) hypertension: Secondary | ICD-10-CM

## 2020-04-20 DIAGNOSIS — I25118 Atherosclerotic heart disease of native coronary artery with other forms of angina pectoris: Secondary | ICD-10-CM

## 2020-04-27 ENCOUNTER — Ambulatory Visit (INDEPENDENT_AMBULATORY_CARE_PROVIDER_SITE_OTHER): Payer: Medicare Other | Admitting: Ophthalmology

## 2020-04-27 ENCOUNTER — Other Ambulatory Visit: Payer: Self-pay

## 2020-04-27 ENCOUNTER — Encounter (INDEPENDENT_AMBULATORY_CARE_PROVIDER_SITE_OTHER): Payer: Self-pay | Admitting: Ophthalmology

## 2020-04-27 DIAGNOSIS — I25118 Atherosclerotic heart disease of native coronary artery with other forms of angina pectoris: Secondary | ICD-10-CM

## 2020-04-27 DIAGNOSIS — H43812 Vitreous degeneration, left eye: Secondary | ICD-10-CM

## 2020-04-27 DIAGNOSIS — G453 Amaurosis fugax: Secondary | ICD-10-CM | POA: Diagnosis not present

## 2020-04-27 NOTE — Assessment & Plan Note (Signed)
Patient describes gray visual sensation like a film, which approaching over the left side of her eye encompassing the central vision, which is opaque to her vision which happens just about every other day and goes away nightly with sleeping.  In the absence of definable optic nerve or vascular disease on clinical examination today, the best explanation could be proximal vascular disease likely intracranial since this is only in the left eye and is evanescent and recurrent on a periodic every other day basis.  Would be unlikely to be proximal carotid disease given the focal nature of her recurrent symptoms.  The patient continues on Eliquis twice daily, and thus I do not believe there is any other therapy that would be appropriate.  She may want a follow-up with her PCP to determine whether or not neurologic evaluation would be appropriate

## 2020-04-27 NOTE — Progress Notes (Signed)
04/27/2020     CHIEF COMPLAINT Patient presents for Blurred Vision (WIP - film OS x 6 months//Pt c/o hazy, cloudy VA OS x 6 months approx. Pt sts it is "occurring more frequently" OS since last visit x 2 months ago. Pt denies ocular pain or flashes OU. Pt reports stable floaters OU. /LBS: 168 "a few days ago")   HISTORY OF PRESENT ILLNESS: Linda Oliver is a 87 y.o. female who presents to the clinic today for:   HPI    Blurred Vision    In left eye.  Onset was gradual.  Vision is blurred and hazy.  Severity is moderate.  This started 6 months ago.  Occurring constantly.  It is worse throughout the day.  Since onset it is gradually worsening.  Treatments tried include no treatments.  Response to treatment was no improvement. Additional comments: WIP - film OS x 6 months  Pt c/o hazy, cloudy VA OS x 6 months approx. Pt sts it is "occurring more frequently" OS since last visit x 2 months ago. Pt denies ocular pain or flashes OU. Pt reports stable floaters OU.  LBS: 168 "a few days ago"       Last edited by Rockie Neighbours, [place] on 04/27/2020  2:58 PM. (History)      Referring physician: Shon Baton, MD [place],  [place] [id number]  HISTORICAL INFORMATION:   Selected notes from the [facility]    Lab Results  Component Value Date   HGBA1C 8.7 (H) 08/23/2018     CURRENT MEDICATIONS: No current outpatient medications on file. (Ophthalmic Drugs)   No current facility-administered medications for this visit. (Ophthalmic Drugs)   Current Outpatient Medications (Other)  Medication Sig  . acetaminophen (TYLENOL) 500 MG tablet Take 500 mg by mouth every 4 (four) hours as needed for moderate pain.  Marland Kitchen apixaban (ELIQUIS) 5 MG TABS tablet Take 1 tablet (5 mg total) by mouth 2 (two) times daily. Will need to see MD for future refills.  . B-D UF III MINI PEN NEEDLES 31G X 5 MM MISC SMARTSIG:1 Each SUB-Q Daily  . busPIRone (BUSPAR) 7.5 MG tablet Take 7.5 mg by mouth 2  (two) times daily.  . DULoxetine (CYMBALTA) 60 MG capsule Take 60 mg by mouth daily.  Marland Kitchen ezetimibe (ZETIA) 10 MG tablet Take 10 mg by mouth daily.  . fesoterodine (TOVIAZ) 8 MG TB24 tablet Take 8 mg by mouth every evening.   . fluticasone (FLONASE) 50 MCG/ACT nasal spray Place 1 spray into both nostrils 2 (two) times daily as needed for allergies or rhinitis.  Marland Kitchen FREESTYLE LITE test strip   . furosemide (LASIX) 20 MG tablet Take 1 tablet (20 mg total) by mouth daily.  Marland Kitchen gabapentin (NEURONTIN) 300 MG capsule Take 300 mg by mouth 2 (two) times daily.  Marland Kitchen glimepiride (AMARYL) 1 MG tablet Take 1 mg by mouth daily.  . hydrOXYzine (ATARAX/VISTARIL) 25 MG tablet Take 50 mg by mouth every evening.   . Insulin Glargine (LANTUS SOLOSTAR) 100 UNIT/ML Solostar Pen Inject 40 Units into the skin daily before breakfast.   . isosorbide dinitrate (ISORDIL) 30 MG tablet TAKE 1 TABLET THREE TIMES A DAY  . metoprolol succinate (TOPROL-XL) 25 MG 24 hr tablet Take 12.5 mg by mouth 2 (two) times daily.  Marland Kitchen NAMZARIC 28-10 MG CP24 TAKE 1 CAPSULE DAILY  . nitroGLYCERIN (NITROSTAT) 0.4 MG SL tablet Place 1 tablet (0.4 mg total) under the tongue every 5 (five) minutes as  needed for up to 25 days for chest pain.  . pantoprazole (PROTONIX) 40 MG tablet Take 40 mg by mouth every evening.   . potassium chloride SA (KLOR-CON) 20 MEQ tablet Take 1 tablet (20 mEq total) by mouth daily.  . rosuvastatin (CRESTOR) 10 MG tablet Take 10 mg by mouth daily before supper.  . sitaGLIPtin (JANUVIA) 100 MG tablet Take 100 mg by mouth daily.  . valsartan (DIOVAN) 160 MG tablet Take 1 tablet (160 mg total) by mouth daily. (Patient not taking: Reported on 11/21/2019)   No current facility-administered medications for this visit. (Other)      REVIEW OF SYSTEMS:    ALLERGIES Allergies  Allergen Reactions  . Percocet [Oxycodone-Acetaminophen] Nausea And Vomiting  . Latex Itching and Rash    EKG leads    PAST MEDICAL HISTORY Past  Medical History:  Diagnosis Date  . Anginal pain ([place])    occ  . Anxiety   . Broken ankle   . Bullous pemphigus   . CAD (coronary artery disease)    [place]   80% stenosis ostial Left main, 30% stenosis proximal LAD, 80% stenosis proximal RCA CABG  -  LIMA to LAD, SVG to int, SVG to [place], [place]   . Depression   . Diabetic peripheral neuropathy ([place])   . Encounter for loop recorder check 04/11/2019  . GERD (gastroesophageal reflux disease)   . H/O: stroke: Left occipital stroke and vision loss 08/24/2018  . Headache(784.0)    hx migraines  . Heart murmur    mvp  . Hyperlipidemia   . Hypertension   . Hypertensive heart disease   . Loop recorder Reveal 08/24/2018 04/11/2019   Scheduled Remote loop recorder check 03/10/2019: The presenting rhythm is sinus bradycardia. There were 0 symptomatic patient activations recorded. There were 275 AF episodes and 0 AT episodes recorded, representing a 4.2 % cumulative AT/AF burden. The longest episode lasted 00:03:40:00 in duration. Some false with PAC. There were 0 tachy episodes detected. There were 0 pause episodes detected. Th  . Nasal bleeding    occ due to O2  . OA (osteoarthritis)   . Obesity (BMI 30-39.9)   . Obstructive sleep apnea on CPAP   . Osteopenia   . Paroxysmal atrial fibrillation ([place]) 04/17/2019  . Peripheral neuropathy   . Seizure ([place])    2009  . Sleep apnea    dx 33yrs ago doesnt use cpap but uses O2 at nite 2L  . Stones in the urinary tract   . Type 2 diabetes mellitus with vascular disease ([place])   . UTI (lower urinary tract infection)    Past Surgical History:  Procedure Laterality Date  . ABDOMINAL HYSTERECTOMY  70's  . CARDIAC CATHETERIZATION    . CARPAL TUNNEL RELEASE Bilateral   . CATARACT EXTRACTION W/PHACO Bilateral   . CHOLECYSTECTOMY    . CORONARY ARTERY BYPASS GRAFT  96  . LOOP RECORDER INSERTION N/A 08/24/2018   Procedure: LOOP RECORDER INSERTION;  Surgeon: Constance Haw, MD;   Location: [facility];  Service: Cardiovascular;  Laterality: N/A;  . SHOULDER ARTHROSCOPY     rt  . SHOULDER ARTHROSCOPY WITH ROTATOR CUFF REPAIR AND SUBACROMIAL DECOMPRESSION  12/01/2011   Procedure: SHOULDER ARTHROSCOPY WITH ROTATOR CUFF REPAIR AND SUBACROMIAL DECOMPRESSION;  Surgeon: Nita Sells, MD;  Location: [facility];  Service: Orthopedics;  Laterality: Left;  shoulder debridement calcific tendonitis    FAMILY HISTORY Family History  Problem Relation Age  of Onset  . Coronary artery disease Mother   . Heart disease Father   . Cancer Father        skin  . Cancer Brother   . Cancer - Colon Brother        brain, Lymphoma    SOCIAL HISTORY Social History   Tobacco Use  . Smoking status: Former Smoker    Packs/day: 1.00    Years: 30.00    Pack years: 30.00    Types: Cigarettes    Quit date: 11/25/1986    Years since quitting: 33.4  . Smokeless tobacco: Never Used  Vaping Use  . Vaping Use: Never used  Substance Use Topics  . Alcohol use: No  . Drug use: No         OPHTHALMIC EXAM: Base Eye Exam    Visual Acuity (ETDRS)      Right Left   Dist cc 20/30 -2 20/60 -2   Dist ph cc NI 20/50 -3   Correction: Glasses       Tonometry (Tonopen, 3:02 PM)      Right Left   Pressure 15 10       Pupils      Pupils Dark Light Shape React APD   Right PERRL 5 4 Round Slow None   Left PERRL 5 4 Round Slow None       Visual Fields (Counting fingers)      Left Right    Full Full       Extraocular Movement      Right Left    Full Full       Neuro/Psych    Oriented x3: Yes   Mood/Affect: Normal       Dilation    Left eye: 1.0% Mydriacyl, 2.5% Phenylephrine @ 3:02 PM        Slit Lamp and Fundus Exam    External Exam      Right Left   External Normal Normal       Slit Lamp Exam      Right Left   Lids/Lashes Normal Normal   Conjunctiva/Sclera White and quiet White and quiet   Cornea Clear Clear   Anterior Chamber Deep and quiet Deep  and quiet   Iris Round and reactive Round and reactive   Lens Posterior chamber intraocular lens, Open posterior capsule Posterior chamber intraocular lens,, with heme in the cul de sac of capsule   Anterior Vitreous Normal Normal       Fundus Exam      Right Left   Posterior Vitreous  AVITRIC   Disc  Drusen   C/D Ratio  0.55   Macula  Old macular hole, close negative Watzke   Vessels  Normal, no DR   Periphery  Normal, no holes or tears          IMAGING AND PROCEDURES  Imaging and Procedures for 04/27/20  OCT, Retina - OU - Both Eyes       Right Eye Quality was good. Scan locations included subfoveal. Central Foveal Thickness: 280. Findings include abnormal foveal contour.   Left Eye Quality was good. Scan locations included subfoveal. Central Foveal Thickness: 284. Progression has been stable. Findings include abnormal foveal contour.   Notes Irregular contour to the macular region in each eye however there is no interval change over the last 12 months In either eye.  For this reason and as well as the occurrence of her evanescent symptoms in the absence of definable Inner  retinal atrophy.  This is defined by lack of intraretinal opacity and intactness of the retinal layers seen on OCT high-definition.                ASSESSMENT/PLAN:  Amaurosis fugax of left eye Patient describes gray visual sensation like a film, which approaching over the left side of her eye encompassing the central vision, which is opaque to her vision which happens just about every other day and goes away nightly with sleeping.  In the absence of definable optic nerve or vascular disease on clinical examination today, the best explanation could be proximal vascular disease likely intracranial since this is only in the left eye and is evanescent and recurrent on a periodic every other day basis.  Would be unlikely to be proximal carotid disease given the focal nature of her recurrent  symptoms.  The patient continues on Eliquis twice daily, and thus I do not believe there is any other therapy that would be appropriate.  She may want a follow-up with her PCP to determine whether or not neurologic evaluation would be appropriate      ICD-10-CM   1. Posterior vitreous detachment of left eye  H43.812 OCT, Retina - OU - Both Eyes  2. Amaurosis fugax of left eye  G45.3     1.  Patient has symptoms of evanescent nearly every other day graying out, filmlike, cannot see through Pearline Cables areas in the left eye.  This suggest possibility of amaurosis fugax but again with the proximal vasculature involved this is not primary an ocular problem but with ocular symptoms.  The recurrence of the focal nature of the of the symptoms suggest vasculature proximal to the left globe yet intracranially and like likely at the internal carotid origin of the ophthalmic artery, as this is recurrent but only in the left eye and not globally in both eyes.  This is not consistent with an occipital lobe vascular event ongoing.  2.  Patient continues on Eliquis thus there is no further medical intervention that I can offer.  Nonetheless she may approach Dr. Virgina Jock to look to see if there is something that could be done from a neurologic standpoint to see if the blood supply can be evaluated or even if it is worth evaluation because there is there are very few tangible ways to remove strictures or blockages that are inside the brain.  3.  I will not schedule patient back to see [place] for some months as there is no retinal disorder requiring my attention.  Ophthalmic Meds Ordered this visit:  No orders of the defined types were placed in this encounter.      Return in about 9 months (around 01/27/2021) for COLOR FP, OCT, DILATE OU.  There are no Patient Instructions on file for this visit.   Explained the diagnoses, plan, and follow up with the patient and they expressed understanding.  Patient expressed  understanding of the importance of proper follow up care.   Clent Demark Traeger Sultana M.D. Diseases & Surgery of the Retina and Vitreous Retina & Diabetic [place] 04/27/20     Abbreviations: M myopia (nearsighted); A astigmatism; H hyperopia (farsighted); P presbyopia; Mrx spectacle prescription;  CTL contact lenses; OD right eye; OS left eye; OU both eyes  XT exotropia; ET esotropia; PEK punctate epithelial keratitis; PEE punctate epithelial erosions; DES dry eye syndrome; MGD meibomian gland dysfunction; ATs artificial tears; PFAT's preservative free artificial tears; [place] nuclear sclerotic cataract; PSC posterior subcapsular cataract; ERM epi-retinal  membrane; PVD posterior vitreous detachment; RD retinal detachment; DM diabetes mellitus; DR diabetic retinopathy; NPDR non-proliferative diabetic retinopathy; PDR proliferative diabetic retinopathy; CSME clinically significant macular edema; DME diabetic macular edema; dbh dot blot hemorrhages; CWS cotton wool spot; POAG primary open angle glaucoma; C/D cup-to-disc ratio; HVF humphrey visual field; GVF goldmann visual field; OCT optical coherence tomography; IOP intraocular pressure; BRVO Branch retinal vein occlusion; CRVO central retinal vein occlusion; CRAO central retinal artery occlusion; BRAO branch retinal artery occlusion; RT retinal tear; SB scleral buckle; PPV pars plana vitrectomy; VH Vitreous hemorrhage; PRP panretinal laser photocoagulation; IVK intravitreal kenalog; VMT vitreomacular traction; MH Macular hole;  NVD neovascularization of the disc; NVE neovascularization elsewhere; AREDS age related eye disease study; ARMD age related macular degeneration; POAG primary open angle glaucoma; EBMD epithelial/anterior basement membrane dystrophy; ACIOL anterior chamber intraocular lens; IOL intraocular lens; PCIOL posterior chamber intraocular lens; Phaco/IOL phacoemulsification with intraocular lens placement; [place] photorefractive keratectomy; LASIK laser  assisted in situ keratomileusis; HTN hypertension; DM diabetes mellitus; COPD chronic obstructive pulmonary disease

## 2020-05-08 DIAGNOSIS — M722 Plantar fascial fibromatosis: Secondary | ICD-10-CM | POA: Diagnosis not present

## 2020-05-08 DIAGNOSIS — M7732 Calcaneal spur, left foot: Secondary | ICD-10-CM | POA: Diagnosis not present

## 2020-05-08 DIAGNOSIS — M7731 Calcaneal spur, right foot: Secondary | ICD-10-CM | POA: Diagnosis not present

## 2020-05-13 DIAGNOSIS — Z95818 Presence of other cardiac implants and grafts: Secondary | ICD-10-CM | POA: Diagnosis not present

## 2020-05-13 DIAGNOSIS — I480 Paroxysmal atrial fibrillation: Secondary | ICD-10-CM | POA: Diagnosis not present

## 2020-05-13 DIAGNOSIS — Z4509 Encounter for adjustment and management of other cardiac device: Secondary | ICD-10-CM | POA: Diagnosis not present

## 2020-05-13 DIAGNOSIS — I6340 Cerebral infarction due to embolism of unspecified cerebral artery: Secondary | ICD-10-CM | POA: Diagnosis not present

## 2020-05-15 DIAGNOSIS — M71571 Other bursitis, not elsewhere classified, right ankle and foot: Secondary | ICD-10-CM | POA: Diagnosis not present

## 2020-05-15 DIAGNOSIS — M71572 Other bursitis, not elsewhere classified, left ankle and foot: Secondary | ICD-10-CM | POA: Diagnosis not present

## 2020-05-15 DIAGNOSIS — M722 Plantar fascial fibromatosis: Secondary | ICD-10-CM | POA: Diagnosis not present

## 2020-05-21 ENCOUNTER — Ambulatory Visit: Payer: Medicare Other | Admitting: Cardiology

## 2020-05-28 DIAGNOSIS — M722 Plantar fascial fibromatosis: Secondary | ICD-10-CM | POA: Diagnosis not present

## 2020-05-29 ENCOUNTER — Other Ambulatory Visit: Payer: Self-pay

## 2020-05-29 ENCOUNTER — Ambulatory Visit: Payer: Medicare Other | Admitting: Cardiology

## 2020-05-29 DIAGNOSIS — I25118 Atherosclerotic heart disease of native coronary artery with other forms of angina pectoris: Secondary | ICD-10-CM

## 2020-05-29 MED ORDER — NITROGLYCERIN 0.4 MG SL SUBL: 0.4000 mg | SUBLINGUAL_TABLET | SUBLINGUAL | 0 refills | Status: AC | PRN

## 2020-06-12 ENCOUNTER — Telehealth: Payer: Self-pay

## 2020-06-12 NOTE — Telephone Encounter (Signed)
Linda Oliver, I think we can do that right? Please set up a visit sooner and I will send her a message on My Chart to increase lasix for now. Call and discuss on Monday how she is feeling

## 2020-06-13 DIAGNOSIS — I480 Paroxysmal atrial fibrillation: Secondary | ICD-10-CM | POA: Diagnosis not present

## 2020-06-13 DIAGNOSIS — I6340 Cerebral infarction due to embolism of unspecified cerebral artery: Secondary | ICD-10-CM | POA: Diagnosis not present

## 2020-06-13 DIAGNOSIS — Z95818 Presence of other cardiac implants and grafts: Secondary | ICD-10-CM | POA: Diagnosis not present

## 2020-06-13 DIAGNOSIS — Z4509 Encounter for adjustment and management of other cardiac device: Secondary | ICD-10-CM | POA: Diagnosis not present

## 2020-06-15 NOTE — Telephone Encounter (Signed)
Called pt daughter to see how pt was doing. Pt daughter mention that they have not yet started with the increase of the laxis, but will try to do it this weekend. Transferred to the front to make a sooner appt.

## 2020-06-23 ENCOUNTER — Encounter: Payer: Self-pay | Admitting: Cardiology

## 2020-06-23 ENCOUNTER — Other Ambulatory Visit: Payer: Self-pay

## 2020-06-23 ENCOUNTER — Ambulatory Visit: Payer: Medicare Other | Admitting: Cardiology

## 2020-06-23 VITALS — BP 119/51 | HR 60 | Temp 98.6°F | Resp 16 | Ht 60.0 in | Wt 172.8 lb

## 2020-06-23 DIAGNOSIS — I2510 Atherosclerotic heart disease of native coronary artery without angina pectoris: Secondary | ICD-10-CM

## 2020-06-23 DIAGNOSIS — Z9889 Other specified postprocedural states: Secondary | ICD-10-CM

## 2020-06-23 DIAGNOSIS — I480 Paroxysmal atrial fibrillation: Secondary | ICD-10-CM

## 2020-06-23 DIAGNOSIS — I6523 Occlusion and stenosis of bilateral carotid arteries: Secondary | ICD-10-CM | POA: Diagnosis not present

## 2020-06-23 NOTE — Progress Notes (Signed)
Primary Physician/Referring:  Shon Baton, MD  Patient ID: Linda Oliver, female    DOB: [birth year], 87 y.o.   MRN: [id number]  Chief Complaint  Patient presents with   Atrial Fibrillation   Shortness of Breath   Congestive Heart Failure   Follow-up    6 months   HPI:    Linda Oliver  is a 87 y.o. Caucasian female patient with CAD s/p CABG x 3 in 1996 in [place], HTN, hyperlipidemia, diabetes with PVD and stage 3a CKD and sleep apnea on CPAP and now compliant, carotid stenosis, and history of  autonomic orthostatic hypotension, CVA with medial left occipital infarct in embolic pattern by MRI in 2020 and loop recorder implanted showed paroxysmal atrial fibrillation, for which she is now on Eliquis.  This is a 48-month office visit. She has had occasional episodes of sudden onset shortness of breath, and occasional episodes of worsening chronic bilateral leg edema which she states is being stable now that she is back on furosemide 20 mg daily.    Accompanied by her daughter.  She has not had any chest pain or palpitations.  No dizziness or syncope.   Past Medical History:  Diagnosis Date   Anginal pain ([place])    occ   Anxiety    Broken ankle    Bullous pemphigus    CAD (coronary artery disease)    [place]   80% stenosis ostial Left main, 30% stenosis proximal LAD, 80% stenosis proximal RCA CABG  -  LIMA to LAD, SVG to int, SVG to [place], [place]    Depression    Diabetic peripheral neuropathy ([place])    Encounter for loop recorder check 04/11/2019   GERD (gastroesophageal reflux disease)    H/O: stroke: Left occipital stroke and vision loss 08/24/2018   Headache(784.0)    hx migraines   Heart murmur    mvp   Hyperlipidemia    Hypertension    Hypertensive heart disease    Loop recorder Reveal 08/24/2018 04/11/2019   Scheduled Remote loop recorder check 03/10/2019: The presenting rhythm is sinus bradycardia. There were 0 symptomatic patient activations recorded. There  were 275 AF episodes and 0 AT episodes recorded, representing a 4.2 % cumulative AT/AF burden. The longest episode lasted 00:03:40:00 in duration. Some false with PAC. There were 0 tachy episodes detected. There were 0 pause episodes detected. Th   Nasal bleeding    occ due to O2   OA (osteoarthritis)    Obesity (BMI 30-39.9)    Obstructive sleep apnea on CPAP    Osteopenia    Paroxysmal atrial fibrillation ([place]) 04/17/2019   Peripheral neuropathy    Seizure ([place])    2009   Sleep apnea    dx 44yrs ago doesnt use cpap but uses O2 at nite 2L   Stones in the urinary tract    Type 2 diabetes mellitus with vascular disease ([place])    UTI (lower urinary tract infection)    Past Surgical History:  Procedure Laterality Date   ABDOMINAL HYSTERECTOMY  70's   CARDIAC CATHETERIZATION     CARPAL TUNNEL RELEASE Bilateral    CATARACT EXTRACTION W/PHACO Bilateral    CHOLECYSTECTOMY     CORONARY ARTERY BYPASS GRAFT  96   LOOP RECORDER INSERTION N/A 08/24/2018   Procedure: LOOP RECORDER INSERTION;  Surgeon: Constance Haw, MD;  Location: [facility];  Service: Cardiovascular;  Laterality: N/A;   SHOULDER ARTHROSCOPY  rt   SHOULDER ARTHROSCOPY WITH ROTATOR CUFF REPAIR AND SUBACROMIAL DECOMPRESSION  12/01/2011   Procedure: SHOULDER ARTHROSCOPY WITH ROTATOR CUFF REPAIR AND SUBACROMIAL DECOMPRESSION;  Surgeon: Nita Sells, MD;  Location: [facility];  Service: Orthopedics;  Laterality: Left;  shoulder debridement calcific tendonitis   Family History  Problem Relation Age of Onset   Coronary artery disease Mother    Heart disease Father    Cancer Father        skin   Cancer Brother    Cancer - Colon Brother        brain, Lymphoma    Social History   Tobacco Use   Smoking status: Former    Packs/day: 1.00    Years: 30.00    Pack years: 30.00    Types: Cigarettes    Quit date: 11/25/1986    Years since quitting: 33.6   Smokeless tobacco: Never  Substance Use Topics    Alcohol use: No   Marital Status: Widowed  ROS  Review of Systems  Cardiovascular:  Positive for irregular heartbeat (chronic) and leg swelling. Negative for chest pain, claudication, dyspnea on exertion, near-syncope, orthopnea, paroxysmal nocturnal dyspnea and syncope.  Respiratory:  Positive for snoring (on CPAP). Negative for hemoptysis and wheezing.   Musculoskeletal:  Positive for joint pain (left elbow).       Left arm pain radiating from the shoulder to the hand. Numbness of left fifth digit.  Left subscapular pain   Gastrointestinal:  Negative for melena.  Genitourinary:  Positive for bladder incontinence (chronic).  Neurological:  Negative for dizziness.  Objective  Blood pressure (!) 119/51, pulse 60, temperature 98.6 F (37 C), temperature source Temporal, resp. rate 16, height 5' (1.524 m), weight 172 lb 12.8 oz (78.4 kg), SpO2 98 %.  Vitals with BMI 06/23/2020 11/21/2019 10/09/2019  Height 5\' 0"  5\' 0"  5\' 0"   Weight 172 lbs 13 oz 172 lbs 169 lbs  BMI 33.75 41.93 79.02  Systolic 409 735 329  Diastolic 51 41 90  Pulse 60 65 72     Physical Exam Constitutional:      General: She is not in acute distress.    Appearance: Normal appearance. She is well-developed. She is not diaphoretic.  Neck:     Vascular: No JVD.     Comments: Carotid bruit is present. Cardiovascular:     Rate and Rhythm: Normal rate and regular rhythm.     Pulses: Intact distal pulses.          Carotid pulses are 2+ on the right side with bruit and 2+ on the left side with bruit.      Radial pulses are 2+ on the right side and 2+ on the left side.       Posterior tibial pulses are 2+ on the right side and 2+ on the left side.     Heart sounds: Murmur heard.  Systolic murmur is present at the upper right sternal border.    No gallop.     Comments: 1-2+ non-pitting ankle edema bilaterally.  No JVD.  Pulmonary:     Effort: Pulmonary effort is normal. No accessory muscle usage or respiratory distress.      Breath sounds: Normal breath sounds. No wheezing, rhonchi or rales.  Chest:     Chest wall: Tenderness (location of loop recorder ) present.  Abdominal:     General: Bowel sounds are normal.     Palpations: Abdomen is soft.     Tenderness: There is no  abdominal tenderness.  Musculoskeletal:     Right shoulder: Normal.     Left shoulder: Tenderness (along scapula) present. No swelling or deformity. Decreased range of motion.     Right elbow: Normal.     Left elbow: No swelling or deformity. Decreased range of motion. Tenderness present in medial epicondyle.  Neurological:     Mental Status: She is alert.   Laboratory examination:   Recent Labs    10/07/19 2012  NA 137  K 4.3  CL 103  CO2 24  GLUCOSE 251*  BUN 20  CREATININE 1.33*  CALCIUM 8.5*  GFRNONAA 36*  GFRAA 42*   CrCl cannot be calculated (Patient's most recent lab result is older than the maximum 21 days allowed.).  CMP Latest Ref Rng & Units 10/07/2019 05/10/2019 10/04/2018  Glucose 70 - 99 mg/dL 251(H) 168(H) 336(H)  BUN 8 - 23 mg/dL 20 13 17   Creatinine 0.44 - 1.00 mg/dL 1.33(H) 1.10(H) 1.27(H)  Sodium 135 - 145 mmol/L 137 140 137  Potassium 3.5 - 5.1 mmol/L 4.3 3.4(L) 4.2  Chloride 98 - 111 mmol/L 103 99 101  CO2 22 - 32 mmol/L 24 27 24   Calcium 8.9 - 10.3 mg/dL 8.5(L) 8.5(L) 8.7(L)  Total Protein 6.5 - 8.1 g/dL - - -  Total Bilirubin 0.3 - 1.2 mg/dL - - -  Alkaline Phos 38 - 126 U/L - - -  AST 15 - 41 U/L - - -  ALT 0 - 44 U/L - - -   CBC Latest Ref Rng & Units 10/07/2019 10/04/2018 08/23/2018  WBC 4.0 - 10.5 K/uL 7.2 9.3 8.6  Hemoglobin 12.0 - 15.0 g/dL 10.6(L) 11.2(L) 11.1(L)  Hematocrit 36.0 - 46.0 % 34.6(L) 37.5 34.7(L)  Platelets 150 - 400 K/uL 240 279 240   Lipid Panel     Component Value Date/Time   CHOL 127 08/23/2018 0434   TRIG 358 (H) 08/23/2018 0434   HDL 27 (L) 08/23/2018 0434   CHOLHDL 4.7 08/23/2018 0434   VLDL 72 (H) 08/23/2018 0434   LDLCALC 28 08/23/2018 0434   HEMOGLOBIN  A1C Lab Results  Component Value Date   HGBA1C 8.7 (H) 08/23/2018   MPG 202.99 08/23/2018   TSH No results for input(s): TSH in the last 8760 hours.   BNP (last 3 results) No results for input(s): BNP in the last 8760 hours.  External labs:  Cholesterol, total 127.000 08/23/2018 HDL 27.000 08/23/2018 LDL 28.000 08/23/2018 Triglycerides 358.000 08/23/2018  A1C 8.400 % 03/29/2019 TSH 1.940 04/01/2019  Hemoglobin 11.200 10/04/2018 INR 1.100 08/22/2018 Platelets 279.000 10/04/2018  Creatinine, Serum 1.000 mg/ 04/01/2019 Potassium 4.200 10/04/2018 Magnesium N/D ALT (SGPT) 12.000 uni 04/01/2019  Medications and allergies   Allergies  Allergen Reactions   Percocet [Oxycodone-Acetaminophen] Nausea And Vomiting   Latex Itching and Rash    EKG leads    Outpatient Medications Prior to Visit  Medication Sig Dispense Refill   acetaminophen (TYLENOL) 500 MG tablet Take 500 mg by mouth every 4 (four) hours as needed for moderate pain.     apixaban (ELIQUIS) 5 MG TABS tablet Take 1 tablet (5 mg total) by mouth 2 (two) times daily. Will need to see MD for future refills. 180 tablet 1   B-D UF III MINI PEN NEEDLES 31G X 5 MM MISC SMARTSIG:1 Each SUB-Q Daily     busPIRone (BUSPAR) 7.5 MG tablet Take 7.5 mg by mouth 2 (two) times daily.     DULoxetine (CYMBALTA) 60 MG capsule Take 60 mg by  mouth daily.     ezetimibe (ZETIA) 10 MG tablet Take 10 mg by mouth daily.     fesoterodine (TOVIAZ) 8 MG TB24 tablet Take 8 mg by mouth every evening.      fluticasone (FLONASE) 50 MCG/ACT nasal spray Place 1 spray into both nostrils 2 (two) times daily as needed for allergies or rhinitis.     FREESTYLE LITE test strip      furosemide (LASIX) 20 MG tablet Take 1 tablet (20 mg total) by mouth daily. 90 tablet 3   gabapentin (NEURONTIN) 300 MG capsule Take 300 mg by mouth 2 (two) times daily.     glimepiride (AMARYL) 1 MG tablet Take 1 mg by mouth daily.     hydrOXYzine (ATARAX/VISTARIL) 25 MG tablet Take 50 mg  by mouth 3 (three) times daily.     Insulin Glargine (LANTUS SOLOSTAR) 100 UNIT/ML Solostar Pen Inject 40 Units into the skin daily before breakfast.      isosorbide dinitrate (ISORDIL) 30 MG tablet TAKE 1 TABLET THREE TIMES A DAY 270 tablet 3   metoprolol succinate (TOPROL-XL) 25 MG 24 hr tablet Take 12.5 mg by mouth 2 (two) times daily.     NAMZARIC 28-10 MG CP24 TAKE 1 CAPSULE DAILY 90 capsule 3   nitroGLYCERIN (NITROSTAT) 0.4 MG SL tablet Place 1 tablet (0.4 mg total) under the tongue every 5 (five) minutes as needed for up to 25 days for chest pain. (Patient taking differently: Place 0.4 mg under the tongue every 5 (five) minutes as needed for chest pain.) 90 tablet 0   pantoprazole (PROTONIX) 40 MG tablet Take 40 mg by mouth every evening.      potassium chloride SA (KLOR-CON) 20 MEQ tablet Take 1 tablet (20 mEq total) by mouth daily. 90 tablet 3   rosuvastatin (CRESTOR) 10 MG tablet Take 10 mg by mouth daily before supper.     sitaGLIPtin (JANUVIA) 100 MG tablet Take 100 mg by mouth daily.     valsartan (DIOVAN) 160 MG tablet Take 1 tablet (160 mg total) by mouth daily. 90 tablet 2   vitamin B-12 (CYANOCOBALAMIN) 1000 MCG tablet Take 1,000 mcg by mouth daily.     No facility-administered medications prior to visit.    Medications after today's encounter: Current Outpatient Medications  Medication Instructions   acetaminophen (TYLENOL) 500 mg, Oral, Every 4 hours PRN   apixaban (ELIQUIS) 5 mg, Oral, 2 times daily, Will need to see MD for future refills.   B-D UF III MINI PEN NEEDLES 31G X 5 MM MISC SMARTSIG:1 Each SUB-Q Daily   busPIRone (BUSPAR) 7.5 mg, Oral, 2 times daily   DULoxetine (CYMBALTA) 60 mg, Oral, Daily   ezetimibe (ZETIA) 10 mg, Oral, Daily   fesoterodine (TOVIAZ) 8 mg, Oral, Every evening   fluticasone (FLONASE) 50 MCG/ACT nasal spray 1 spray, Each Nare, 2 times daily PRN   FREESTYLE LITE test strip No dose, route, or frequency recorded.   furosemide (LASIX) 20 mg,  Oral, Daily   gabapentin (NEURONTIN) 300 mg, Oral, 2 times daily   glimepiride (AMARYL) 1 mg, Oral, Daily   hydrOXYzine (ATARAX/VISTARIL) 50 mg, Oral, 3 times daily   isosorbide dinitrate (ISORDIL) 30 MG tablet TAKE 1 TABLET THREE TIMES A DAY   Lantus SoloStar 40 Units, Subcutaneous, Daily before breakfast   metoprolol succinate (TOPROL-XL) 12.5 mg, Oral, 2 times daily   NAMZARIC 28-10 MG CP24 TAKE 1 CAPSULE DAILY   nitroGLYCERIN (NITROSTAT) 0.4 mg, Sublingual, Every 5 min PRN  pantoprazole (PROTONIX) 40 mg, Oral, Every evening   potassium chloride SA (KLOR-CON) 20 MEQ tablet 20 mEq, Oral, Daily   rosuvastatin (CRESTOR) 10 mg, Oral, Daily before supper   sitaGLIPtin (JANUVIA) 100 mg, Oral, Daily   valsartan (DIOVAN) 160 mg, Oral, Daily   vitamin B-12 (CYANOCOBALAMIN) 1,000 mcg, Oral, Daily     Radiology:   CT Angio Neck 08/23/2018: 1. Left subclavian origin high-grade stenosis with delayed enhancement of the distal left vertebral artery. Additionally, there is irregular plaque/mural thrombus involving the aortic isthmus and left subclavian origin. 2. Left PCA branch occlusion correlating with the known acute infarct. 3. Cervical carotid atherosclerosis with up to 50% stenosis at the left ICA bulb. 4. 3 cm right thyroid mass, recommend elective ultrasound.  CXR PA/Lat 05/10/2019: The heart size and mediastinal contours are within normal limits. Prior CABG again noted. Aortic atherosclerosis incidentally noted. Cardiac loop recorder is seen anterior chest wall. Both lungs are clear. The visualized skeletal structures are unremarkable.  Chest x-ray 10/07/2019: Stable cardiomediastinal silhouette. No pneumothorax or pleural effusion is noted. Both lungs are clear. The visualized skeletal structures are unremarkable IMPRESSION: No active cardiopulmonary disease.  Cardiac Studies:   Coronary angiogram 1996: [place] 80% stenosis ostial Left main, 30% stenosis proximal LAD, 80%  stenosis proximal RCA CABG  -  LIMA to LAD, SVG to int, SVG to [place], [place]   Lexiscan Tetrofosmin Stress Test  05/15/2019: Nondiagnostic ECG stress. Myocardial perfusion is abnormal. Mild degree large extent perfusion defect consistent with moderate ischemia located in the apical inferior wall, mid inferior wall, mid inferoseptal wall, basal inferior wall and basal inferoseptal wall (Right Coronary Artery region) of left ventricle. Gated SPECT imaging of the left ventricle was normal,  demonstrating hypokinesis of the apical inferior wall, mid inferior wall and basal inferior wall.  Stress LV EF is normal 55%.  Compared to the study done on 10/24/2016, previously minimal ischemia was evident and LVEF was 72%.  Low risk.  Echocardiogram 11/27/2019: Normal LV systolic function with visual EF 50-55%. Left ventricle cavity is normal in size. Mild left ventricular hypertrophy. Normal global wall motion. Doppler evidence of grade II diastolic dysfunction, elevated LAP. Left atrial cavity is mildly dilated. Right atrial cavity is slightly dilated. Moderate (Grade II) aortic regurgitation. Mild (Grade I) mitral regurgitation. Moderate tricuspid regurgitation. Mild pulmonary hypertension. RVSP measures 48 mmHg. Moderate pulmonic regurgitation. IVC is dilated with a respiratory response of <50%. No significant changes compared to prior study dated 03/22/2019.  Carotid artery duplex 11/27/2019: Stenosis in the right internal carotid artery (50-69%). Stenosis in the right external carotid artery (<50%). Stenosis in the left internal carotid artery (16-49%). Stenosis in the left external carotid artery (<50%) Antegrade right vertebral artery flow. Antegrade left vertebral artery flow. Compared to the study done on 03/22/2019, no significant change. Follow up in six months is appropriate if clinically indicated.  Device Clinic: [employer] Loop recorder   Scheduled Remote loop recorder check  05/24/2020: Predominant rhythm is normal sinus rhythm.  No recurrence of atrial fibrillation since last episode noted on prior transmission 04/22/2020.  Longest AF episode in the last 90 days was 03/07/2020 for 5 minutes.  EKG  EKG 06/23/2020: Normal sinus rhythm at rate of 67 bpm, borderline criteria for left renal enlargement, right axis deviation, right bundle branch block.  Pulmonary disease pattern.  Nonspecific ST-T abnormality. Compared to  EKG 11/21/2019, right axis is now well pronounced.  EKG 10-19-2019 (external):ectopic atrial rhythm at rate of 70 bpm.  Normal axis. Incomplete right bundle branch block. Lateral ST depression, unchanged from EKG 10/07/2019 as well as EKG 03/11/2019.  03/11/2019: Normal sinus rhythm with PAC's at 67 bpm, normal axis, IRBBB, lateral ST depression, cannot exclude ischemia, unchanged from EKG in June 2019.  Assessment     ICD-10-CM   1. Coronary artery disease involving native coronary artery of native heart without angina pectoris  I25.10     2. Paroxysmal atrial fibrillation (HCC)  I48.0 EKG 12-Lead    3. Carotid stenosis, asymptomatic, bilateral  I65.23     4. Loop recorder [employer] Reveal [facility]  08/24/2018  Z98.890       CHA2DS2-VASc Score is 7.  Yearly risk of stroke: > 9.8% (F, A, HTN, DM, Vasc Dz).  Score of 1=0.6; 2=2.2; 3=3.2; 4=4.8; 5=7.2; 6=9.8; 7=>9.8) -(CHF; HTN; vasc disease DM,  Female = 1; Age <65 =0; 65-74 = 1,  >75 =2; stroke/embolism= 2).    No orders of the defined types were placed in this encounter.   There are no discontinued medications.    Recommendations:   CASSI JENNE  is a 87 y.o. Caucasian female patient with CAD s/p CABG x 3 in 1996 in [place], HTN, hyperlipidemia, diabetes with PVD and stage 3a CKD and sleep apnea on CPAP and now compliant, carotid stenosis, and history of  autonomic orthostatic hypotension, CVA with medial left occipital infarct in embolic pattern by MRI in 2020 and loop recorder implanted showed  paroxysmal atrial fibrillation, for which she is now on Eliquis.  She is presently doing well without any clinical evidence of heart failure.  She has mild leg edema, she had called [place] previously regarding leg edema and since being back on 20 mg of furosemide on a daily basis, she is presently doing well.  With regard to carotid artery stenosis, we will stop the surveillance due to advanced age and moderate disease only.  Continue primary prevention, lipids are at goal and blood pressures well controlled.  Loop recorder status reviewed with the patient, she is maintaining sinus rhythm.  I will see her back in 6 months for follow-up.  Patient's daughter is present at the bedside and all questions answered.   Adrian Prows, MD, [facility] 06/23/2020, 5:52 PM Office: [phone] Pager: [phone]

## 2020-06-30 ENCOUNTER — Telehealth: Payer: Medicare Other | Admitting: Adult Health

## 2020-07-02 DIAGNOSIS — Z794 Long term (current) use of insulin: Secondary | ICD-10-CM | POA: Diagnosis not present

## 2020-07-02 DIAGNOSIS — E785 Hyperlipidemia, unspecified: Secondary | ICD-10-CM | POA: Diagnosis not present

## 2020-07-02 DIAGNOSIS — I951 Orthostatic hypotension: Secondary | ICD-10-CM | POA: Diagnosis not present

## 2020-07-02 DIAGNOSIS — D6869 Other thrombophilia: Secondary | ICD-10-CM | POA: Diagnosis not present

## 2020-07-02 DIAGNOSIS — I700 Atherosclerosis of aorta: Secondary | ICD-10-CM | POA: Diagnosis not present

## 2020-07-02 DIAGNOSIS — E1149 Type 2 diabetes mellitus with other diabetic neurological complication: Secondary | ICD-10-CM | POA: Diagnosis not present

## 2020-07-02 DIAGNOSIS — G629 Polyneuropathy, unspecified: Secondary | ICD-10-CM | POA: Diagnosis not present

## 2020-07-02 DIAGNOSIS — I119 Hypertensive heart disease without heart failure: Secondary | ICD-10-CM | POA: Diagnosis not present

## 2020-07-02 DIAGNOSIS — N1831 Chronic kidney disease, stage 3a: Secondary | ICD-10-CM | POA: Diagnosis not present

## 2020-07-02 DIAGNOSIS — I2510 Atherosclerotic heart disease of native coronary artery without angina pectoris: Secondary | ICD-10-CM | POA: Diagnosis not present

## 2020-07-02 DIAGNOSIS — I4891 Unspecified atrial fibrillation: Secondary | ICD-10-CM | POA: Diagnosis not present

## 2020-07-02 DIAGNOSIS — E1142 Type 2 diabetes mellitus with diabetic polyneuropathy: Secondary | ICD-10-CM | POA: Diagnosis not present

## 2020-07-07 ENCOUNTER — Ambulatory Visit: Payer: Medicare Other | Admitting: Cardiology

## 2020-07-08 ENCOUNTER — Ambulatory Visit: Payer: Medicare Other | Admitting: Cardiology

## 2020-07-14 DIAGNOSIS — I6340 Cerebral infarction due to embolism of unspecified cerebral artery: Secondary | ICD-10-CM | POA: Diagnosis not present

## 2020-07-14 DIAGNOSIS — Z95818 Presence of other cardiac implants and grafts: Secondary | ICD-10-CM | POA: Diagnosis not present

## 2020-07-14 DIAGNOSIS — Z4509 Encounter for adjustment and management of other cardiac device: Secondary | ICD-10-CM | POA: Diagnosis not present

## 2020-07-14 DIAGNOSIS — I480 Paroxysmal atrial fibrillation: Secondary | ICD-10-CM | POA: Diagnosis not present

## 2020-07-14 DIAGNOSIS — M722 Plantar fascial fibromatosis: Secondary | ICD-10-CM | POA: Diagnosis not present

## 2020-07-15 DIAGNOSIS — Z20822 Contact with and (suspected) exposure to covid-19: Secondary | ICD-10-CM | POA: Diagnosis not present

## 2020-07-28 DIAGNOSIS — M722 Plantar fascial fibromatosis: Secondary | ICD-10-CM | POA: Diagnosis not present

## 2020-08-04 DIAGNOSIS — Z9889 Other specified postprocedural states: Secondary | ICD-10-CM | POA: Diagnosis not present

## 2020-08-04 DIAGNOSIS — Z961 Presence of intraocular lens: Secondary | ICD-10-CM | POA: Diagnosis not present

## 2020-08-04 DIAGNOSIS — H04123 Dry eye syndrome of bilateral lacrimal glands: Secondary | ICD-10-CM | POA: Diagnosis not present

## 2020-08-04 DIAGNOSIS — H53461 Homonymous bilateral field defects, right side: Secondary | ICD-10-CM | POA: Diagnosis not present

## 2020-08-04 DIAGNOSIS — H35373 Puckering of macula, bilateral: Secondary | ICD-10-CM | POA: Diagnosis not present

## 2020-08-04 DIAGNOSIS — E119 Type 2 diabetes mellitus without complications: Secondary | ICD-10-CM | POA: Diagnosis not present

## 2020-08-11 DIAGNOSIS — N3946 Mixed incontinence: Secondary | ICD-10-CM | POA: Diagnosis not present

## 2020-08-14 DIAGNOSIS — I480 Paroxysmal atrial fibrillation: Secondary | ICD-10-CM | POA: Diagnosis not present

## 2020-08-14 DIAGNOSIS — Z4509 Encounter for adjustment and management of other cardiac device: Secondary | ICD-10-CM | POA: Diagnosis not present

## 2020-08-14 DIAGNOSIS — Z95818 Presence of other cardiac implants and grafts: Secondary | ICD-10-CM | POA: Diagnosis not present

## 2020-08-14 DIAGNOSIS — I6340 Cerebral infarction due to embolism of unspecified cerebral artery: Secondary | ICD-10-CM | POA: Diagnosis not present

## 2020-08-31 ENCOUNTER — Other Ambulatory Visit: Payer: Self-pay | Admitting: Cardiology

## 2020-08-31 DIAGNOSIS — I5032 Chronic diastolic (congestive) heart failure: Secondary | ICD-10-CM

## 2020-09-10 DIAGNOSIS — M71572 Other bursitis, not elsewhere classified, left ankle and foot: Secondary | ICD-10-CM | POA: Diagnosis not present

## 2020-09-10 DIAGNOSIS — M71571 Other bursitis, not elsewhere classified, right ankle and foot: Secondary | ICD-10-CM | POA: Diagnosis not present

## 2020-09-13 DIAGNOSIS — I6310 Cerebral infarction due to embolism of unspecified precerebral artery: Secondary | ICD-10-CM | POA: Diagnosis not present

## 2020-09-13 DIAGNOSIS — Z4509 Encounter for adjustment and management of other cardiac device: Secondary | ICD-10-CM | POA: Diagnosis not present

## 2020-09-13 DIAGNOSIS — I480 Paroxysmal atrial fibrillation: Secondary | ICD-10-CM | POA: Diagnosis not present

## 2020-09-13 DIAGNOSIS — Z95818 Presence of other cardiac implants and grafts: Secondary | ICD-10-CM | POA: Diagnosis not present

## 2020-09-25 DIAGNOSIS — M722 Plantar fascial fibromatosis: Secondary | ICD-10-CM | POA: Diagnosis not present

## 2020-10-02 DIAGNOSIS — S2232XA Fracture of one rib, left side, initial encounter for closed fracture: Secondary | ICD-10-CM | POA: Diagnosis not present

## 2020-10-02 DIAGNOSIS — I10 Essential (primary) hypertension: Secondary | ICD-10-CM | POA: Diagnosis not present

## 2020-10-02 DIAGNOSIS — R0781 Pleurodynia: Secondary | ICD-10-CM | POA: Diagnosis not present

## 2020-10-02 DIAGNOSIS — I2510 Atherosclerotic heart disease of native coronary artery without angina pectoris: Secondary | ICD-10-CM | POA: Diagnosis not present

## 2020-10-02 DIAGNOSIS — Z87891 Personal history of nicotine dependence: Secondary | ICD-10-CM | POA: Diagnosis not present

## 2020-10-02 DIAGNOSIS — S2242XA Multiple fractures of ribs, left side, initial encounter for closed fracture: Secondary | ICD-10-CM | POA: Diagnosis not present

## 2020-10-02 DIAGNOSIS — R0689 Other abnormalities of breathing: Secondary | ICD-10-CM | POA: Diagnosis not present

## 2020-10-02 DIAGNOSIS — S3991XA Unspecified injury of abdomen, initial encounter: Secondary | ICD-10-CM | POA: Diagnosis not present

## 2020-10-02 DIAGNOSIS — S3993XA Unspecified injury of pelvis, initial encounter: Secondary | ICD-10-CM | POA: Diagnosis not present

## 2020-10-02 DIAGNOSIS — R0902 Hypoxemia: Secondary | ICD-10-CM | POA: Diagnosis not present

## 2020-10-02 DIAGNOSIS — J8410 Pulmonary fibrosis, unspecified: Secondary | ICD-10-CM | POA: Diagnosis not present

## 2020-10-14 DIAGNOSIS — Z4509 Encounter for adjustment and management of other cardiac device: Secondary | ICD-10-CM | POA: Diagnosis not present

## 2020-10-14 DIAGNOSIS — Z95818 Presence of other cardiac implants and grafts: Secondary | ICD-10-CM | POA: Diagnosis not present

## 2020-10-14 DIAGNOSIS — I6340 Cerebral infarction due to embolism of unspecified cerebral artery: Secondary | ICD-10-CM | POA: Diagnosis not present

## 2020-10-14 DIAGNOSIS — I480 Paroxysmal atrial fibrillation: Secondary | ICD-10-CM | POA: Diagnosis not present

## 2020-10-21 DIAGNOSIS — I10 Essential (primary) hypertension: Secondary | ICD-10-CM | POA: Diagnosis not present

## 2020-10-21 DIAGNOSIS — I5032 Chronic diastolic (congestive) heart failure: Secondary | ICD-10-CM | POA: Diagnosis not present

## 2020-10-21 DIAGNOSIS — I4891 Unspecified atrial fibrillation: Secondary | ICD-10-CM | POA: Diagnosis not present

## 2020-10-21 DIAGNOSIS — D6869 Other thrombophilia: Secondary | ICD-10-CM | POA: Diagnosis not present

## 2020-10-21 DIAGNOSIS — Z23 Encounter for immunization: Secondary | ICD-10-CM | POA: Diagnosis not present

## 2020-10-21 DIAGNOSIS — R600 Localized edema: Secondary | ICD-10-CM | POA: Diagnosis not present

## 2020-10-22 DIAGNOSIS — R350 Frequency of micturition: Secondary | ICD-10-CM | POA: Diagnosis not present

## 2020-10-22 DIAGNOSIS — N3946 Mixed incontinence: Secondary | ICD-10-CM | POA: Diagnosis not present

## 2020-10-23 DIAGNOSIS — R0782 Intercostal pain: Secondary | ICD-10-CM | POA: Diagnosis not present

## 2020-10-23 DIAGNOSIS — Z23 Encounter for immunization: Secondary | ICD-10-CM | POA: Diagnosis not present

## 2020-11-02 ENCOUNTER — Other Ambulatory Visit: Payer: Self-pay | Admitting: Cardiology

## 2020-11-02 DIAGNOSIS — I5032 Chronic diastolic (congestive) heart failure: Secondary | ICD-10-CM

## 2020-11-14 DIAGNOSIS — Z95818 Presence of other cardiac implants and grafts: Secondary | ICD-10-CM | POA: Diagnosis not present

## 2020-11-14 DIAGNOSIS — Z4509 Encounter for adjustment and management of other cardiac device: Secondary | ICD-10-CM | POA: Diagnosis not present

## 2020-11-14 DIAGNOSIS — I6340 Cerebral infarction due to embolism of unspecified cerebral artery: Secondary | ICD-10-CM | POA: Diagnosis not present

## 2020-11-14 DIAGNOSIS — I480 Paroxysmal atrial fibrillation: Secondary | ICD-10-CM | POA: Diagnosis not present

## 2020-12-01 ENCOUNTER — Encounter (INDEPENDENT_AMBULATORY_CARE_PROVIDER_SITE_OTHER): Payer: Medicare Other | Admitting: Ophthalmology

## 2020-12-15 DIAGNOSIS — Z95818 Presence of other cardiac implants and grafts: Secondary | ICD-10-CM | POA: Diagnosis not present

## 2020-12-15 DIAGNOSIS — Z4509 Encounter for adjustment and management of other cardiac device: Secondary | ICD-10-CM | POA: Diagnosis not present

## 2020-12-15 DIAGNOSIS — I480 Paroxysmal atrial fibrillation: Secondary | ICD-10-CM | POA: Diagnosis not present

## 2020-12-15 DIAGNOSIS — I6340 Cerebral infarction due to embolism of unspecified cerebral artery: Secondary | ICD-10-CM | POA: Diagnosis not present

## 2020-12-25 ENCOUNTER — Ambulatory Visit: Payer: Medicare Other | Admitting: Cardiology

## 2021-01-10 DIAGNOSIS — Z20822 Contact with and (suspected) exposure to covid-19: Secondary | ICD-10-CM | POA: Diagnosis not present

## 2021-01-10 HISTORY — PX: CORONARY ANGIOPLASTY WITH STENT PLACEMENT: SHX49

## 2021-01-13 DIAGNOSIS — E538 Deficiency of other specified B group vitamins: Secondary | ICD-10-CM | POA: Diagnosis not present

## 2021-01-14 ENCOUNTER — Encounter: Payer: Self-pay | Admitting: Cardiology

## 2021-01-14 ENCOUNTER — Other Ambulatory Visit: Payer: Self-pay

## 2021-01-14 ENCOUNTER — Ambulatory Visit: Payer: Medicare Other | Admitting: Cardiology

## 2021-01-14 VITALS — BP 126/61 | HR 76 | Temp 97.6°F | Resp 17 | Ht 60.0 in | Wt 172.8 lb

## 2021-01-14 DIAGNOSIS — Z9989 Dependence on other enabling machines and devices: Secondary | ICD-10-CM | POA: Diagnosis not present

## 2021-01-14 DIAGNOSIS — I5032 Chronic diastolic (congestive) heart failure: Secondary | ICD-10-CM | POA: Diagnosis not present

## 2021-01-14 DIAGNOSIS — I480 Paroxysmal atrial fibrillation: Secondary | ICD-10-CM | POA: Diagnosis not present

## 2021-01-14 DIAGNOSIS — R9431 Abnormal electrocardiogram [ECG] [EKG]: Secondary | ICD-10-CM | POA: Diagnosis not present

## 2021-01-14 DIAGNOSIS — I5023 Acute on chronic systolic (congestive) heart failure: Secondary | ICD-10-CM | POA: Insufficient documentation

## 2021-01-14 DIAGNOSIS — E785 Hyperlipidemia, unspecified: Secondary | ICD-10-CM | POA: Diagnosis not present

## 2021-01-14 DIAGNOSIS — I10 Essential (primary) hypertension: Secondary | ICD-10-CM | POA: Diagnosis not present

## 2021-01-14 DIAGNOSIS — Z9889 Other specified postprocedural states: Secondary | ICD-10-CM

## 2021-01-14 DIAGNOSIS — G4733 Obstructive sleep apnea (adult) (pediatric): Secondary | ICD-10-CM | POA: Diagnosis not present

## 2021-01-14 DIAGNOSIS — I5033 Acute on chronic diastolic (congestive) heart failure: Secondary | ICD-10-CM | POA: Insufficient documentation

## 2021-01-14 DIAGNOSIS — E1142 Type 2 diabetes mellitus with diabetic polyneuropathy: Secondary | ICD-10-CM | POA: Diagnosis not present

## 2021-01-14 DIAGNOSIS — I2510 Atherosclerotic heart disease of native coronary artery without angina pectoris: Secondary | ICD-10-CM | POA: Diagnosis not present

## 2021-01-14 DIAGNOSIS — E559 Vitamin D deficiency, unspecified: Secondary | ICD-10-CM | POA: Diagnosis not present

## 2021-01-14 NOTE — Progress Notes (Signed)
Primary Physician/Referring:  Shon Baton, MD  Patient ID: Linda Oliver, female    DOB: 1932/11/26, 88 y.o.   MRN: [id number]  Chief Complaint  Patient presents with   Coronary Artery Disease   Atrial Fibrillation    6 MONTHS   HPI:    Linda Oliver  is a 88 y.o. Caucasian female patient with CAD s/p CABG x 3 in 1996 in [place], HTN, hyperlipidemia, diabetes with PVD and stage 3a CKD and sleep apnea on CPAP and now compliant, carotid stenosis, and history of  autonomic orthostatic hypotension, CVA with medial left occipital infarct in embolic pattern by MRI in 2020 and loop recorder implanted showed paroxysmal atrial fibrillation, for which she is now on Eliquis.  She is presently doing well without any clinical evidence of heart failure.  She has mild leg edema, she has had 1 episode of leg oozing about 2 months ago.  Accompanied by her daughter.  She has not had any chest pain or palpitations.  No dizziness or syncope.  Continues to have left shoulder pain that is chronic due to arthritis.   Past Medical History:  Diagnosis Date   Anginal pain ([place])    occ   Anxiety    Broken ankle    Bullous pemphigus    CAD (coronary artery disease)    [place]   80% stenosis ostial Left main, 30% stenosis proximal LAD, 80% stenosis proximal RCA CABG  -  LIMA to LAD, SVG to int, SVG to [place], [place]    Depression    Diabetic peripheral neuropathy ([place])    Encounter for loop recorder check 04/11/2019   GERD (gastroesophageal reflux disease)    H/O: stroke: Left occipital stroke and vision loss 08/24/2018   Headache(784.0)    hx migraines   Heart murmur    mvp   Hyperlipidemia    Hypertension    Hypertensive heart disease    Loop recorder Reveal 08/24/2018 04/11/2019   Scheduled Remote loop recorder check 03/10/2019: The presenting rhythm is sinus bradycardia. There were 0 symptomatic patient activations recorded. There were 275 AF episodes and 0 AT episodes recorded,  representing a 4.2 % cumulative AT/AF burden. The longest episode lasted 00:03:40:00 in duration. Some false with PAC. There were 0 tachy episodes detected. There were 0 pause episodes detected. Th   Nasal bleeding    occ due to O2   OA (osteoarthritis)    Obesity (BMI 30-39.9)    Obstructive sleep apnea on CPAP    Osteopenia    Paroxysmal atrial fibrillation ([place]) 04/17/2019   Peripheral neuropathy    Seizure ([place])    2009   Sleep apnea    dx 20yrs ago doesnt use cpap but uses O2 at nite 2L   Stones in the urinary tract    Type 2 diabetes mellitus with vascular disease ([place])    UTI (lower urinary tract infection)    Past Surgical History:  Procedure Laterality Date   ABDOMINAL HYSTERECTOMY  70's   CARDIAC CATHETERIZATION     CARPAL TUNNEL RELEASE Bilateral    CATARACT EXTRACTION W/PHACO Bilateral    CHOLECYSTECTOMY     CORONARY ARTERY BYPASS GRAFT  96   LOOP RECORDER INSERTION N/A 08/24/2018   Procedure: LOOP RECORDER INSERTION;  Surgeon: Constance Haw, MD;  Location: [facility];  Service: Cardiovascular;  Laterality: N/A;   SHOULDER ARTHROSCOPY     rt   SHOULDER ARTHROSCOPY WITH ROTATOR CUFF  REPAIR AND SUBACROMIAL DECOMPRESSION  12/01/2011   Procedure: SHOULDER ARTHROSCOPY WITH ROTATOR CUFF REPAIR AND SUBACROMIAL DECOMPRESSION;  Surgeon: Nita Sells, MD;  Location: [facility];  Service: Orthopedics;  Laterality: Left;  shoulder debridement calcific tendonitis   Family History  Problem Relation Age of Onset   Coronary artery disease Mother    Heart disease Father    Cancer Father        skin   Cancer Brother    Cancer - Colon Brother        brain, Lymphoma    Social History   Tobacco Use   Smoking status: Former    Packs/day: 1.00    Years: 30.00    Pack years: 30.00    Types: Cigarettes    Quit date: 11/25/1986    Years since quitting: 34.1   Smokeless tobacco: Never  Substance Use Topics   Alcohol use: No   Marital Status: Widowed  ROS   Review of Systems  Cardiovascular:  Positive for irregular heartbeat (chronic) and leg swelling. Negative for chest pain, claudication and dyspnea on exertion.  Respiratory:  Positive for snoring (on CPAP).   Musculoskeletal:  Positive for joint pain (left elbow).       Left arm pain radiating from the shoulder to the hand. Numbness of left fifth digit.  Left subscapular pain   Genitourinary:  Positive for bladder incontinence (chronic).  Objective  Blood pressure 126/61, pulse 76, temperature 97.6 F (36.4 C), temperature source Temporal, resp. rate 17, height 5' (1.524 m), weight 172 lb 12.8 oz (78.4 kg), SpO2 95 %.  Vitals with BMI 01/14/2021 06/23/2020 11/21/2019  Height $Remov'5\' 0"'mKgBrm$  $Remove'5\' 0"'qWoYifv$  $RemoveB'5\' 0"'NgxDKSXv$   Weight 172 lbs 13 oz 172 lbs 13 oz 172 lbs  BMI 33.75 57.32 20.25  Systolic 427 062 376  Diastolic 61 51 41  Pulse 76 60 65     Physical Exam Constitutional:      Appearance: She is well-developed.  Neck:     Vascular: Carotid bruit (bilateral) present. No JVD.  Cardiovascular:     Rate and Rhythm: Normal rate and regular rhythm.     Pulses: Intact distal pulses.          Radial pulses are 2+ on the right side and 2+ on the left side.       Posterior tibial pulses are 2+ on the right side and 2+ on the left side.     Heart sounds: Murmur heard.  Systolic murmur is present at the upper right sternal border.    No gallop.  Pulmonary:     Effort: Pulmonary effort is normal. No accessory muscle usage or respiratory distress.     Breath sounds: Normal breath sounds. No wheezing, rhonchi or rales.  Chest:     Chest wall: Tenderness (location of loop recorder ) present.  Abdominal:     General: Bowel sounds are normal.     Palpations: Abdomen is soft.     Tenderness: There is no abdominal tenderness.  Musculoskeletal:     Right shoulder: Normal.     Left shoulder: Tenderness (along scapula) present. No swelling or deformity. Decreased range of motion.     Right elbow: Normal.     Left elbow:  No swelling or deformity. Decreased range of motion. Tenderness present in medial epicondyle.     Right lower leg: Edema (trace ankle edema) present.     Left lower leg: Edema (trace ankle edema) present.   Laboratory examination:   External  labs:   Labs 01/14/2021:  GLUCOSE 102   60 - 110 BUN  13   5 - 23 CREATININE 0.9   0.3 - 1.5 eGFR Non-African American 59.4    SODIUM 142   135 - 148 POTASSIUM 4.4   3.5 - 5.3  CHOLESTEROL  108   100 - 200 TRIGLYCERIDES 80   30 - 149 HDL   36   40 - 60 LDL   56   < CHOL/HDL RATIO 3.0   <  TSH    2.29   HGB 11.5   12.0 - 18.0 HCT 36.3   37.0 - 51.0 MCV 88.1   80.0 - 97.0 MCH 28.0   26.0 - 32.0 MCHC 31.8   31.0 - 36.0 RDW 14.8   12.3 - 15.4 PLT 240   140 - 440  Medications and allergies   Allergies  Allergen Reactions   Percocet [Oxycodone-Acetaminophen] Nausea And Vomiting   Latex Itching and Rash    EKG leads    Outpatient Medications Prior to Visit  Medication Sig Dispense Refill   acetaminophen (TYLENOL) 500 MG tablet Take 500 mg by mouth every 4 (four) hours as needed for moderate pain.     apixaban (ELIQUIS) 5 MG TABS tablet Take 1 tablet (5 mg total) by mouth 2 (two) times daily. Will need to see MD for future refills. 180 tablet 1   B-D UF III MINI PEN NEEDLES 31G X 5 MM MISC SMARTSIG:1 Each SUB-Q Daily     busPIRone (BUSPAR) 7.5 MG tablet Take 7.5 mg by mouth 2 (two) times daily.     DULoxetine (CYMBALTA) 60 MG capsule Take 60 mg by mouth daily.     ezetimibe (ZETIA) 10 MG tablet Take 10 mg by mouth daily.     Ferrous Sulfate Dried (HIGH POTENCY IRON) 65 MG TABS Take 1 tablet by mouth daily.     fesoterodine (TOVIAZ) 8 MG TB24 tablet Take 8 mg by mouth every evening.      fluticasone (FLONASE) 50 MCG/ACT nasal spray Place 1 spray into both nostrils 2 (two) times daily as needed for allergies or rhinitis.     FREESTYLE LITE test strip      furosemide (LASIX) 20 MG tablet TAKE 1 TABLET DAILY 90 tablet 3   gabapentin  (NEURONTIN) 300 MG capsule Take 300 mg by mouth 2 (two) times daily.     glimepiride (AMARYL) 1 MG tablet Take 1 mg by mouth daily.     hydrOXYzine (ATARAX/VISTARIL) 25 MG tablet Take 50 mg by mouth 3 (three) times daily.     Insulin Glargine (LANTUS SOLOSTAR) 100 UNIT/ML Solostar Pen Inject 40 Units into the skin daily before breakfast.      isosorbide dinitrate (ISORDIL) 30 MG tablet TAKE 1 TABLET THREE TIMES A DAY 270 tablet 3   metoprolol succinate (TOPROL-XL) 25 MG 24 hr tablet Take 12.5 mg by mouth 2 (two) times daily.     mirabegron ER (MYRBETRIQ) 50 MG TB24 tablet Take 50 mg by mouth daily.     NAMZARIC 28-10 MG CP24 TAKE 1 CAPSULE DAILY 90 capsule 3   nitroGLYCERIN (NITROSTAT) 0.4 MG SL tablet Place 1 tablet (0.4 mg total) under the tongue every 5 (five) minutes as needed for up to 25 days for chest pain. (Patient taking differently: Place 0.4 mg under the tongue every 5 (five) minutes as needed for chest pain.) 90 tablet 0   pantoprazole (PROTONIX) 40 MG tablet Take 40 mg by mouth every  evening.      potassium chloride SA (KLOR-CON) 20 MEQ tablet TAKE 1 TABLET DAILY 90 tablet 0   rosuvastatin (CRESTOR) 10 MG tablet Take 10 mg by mouth daily before supper.     sitaGLIPtin (JANUVIA) 100 MG tablet Take 100 mg by mouth daily.     vitamin B-12 (CYANOCOBALAMIN) 1000 MCG tablet Take 1,000 mcg by mouth daily.     ferrous sulfate 325 (65 FE) MG tablet Take 325 mg by mouth daily with breakfast.     valsartan (DIOVAN) 160 MG tablet Take 1 tablet (160 mg total) by mouth daily. 90 tablet 2   No facility-administered medications prior to visit.    Medications after today's encounter: Current Outpatient Medications  Medication Instructions   acetaminophen (TYLENOL) 500 mg, Oral, Every 4 hours PRN   apixaban (ELIQUIS) 5 mg, Oral, 2 times daily, Will need to see MD for future refills.   B-D UF III MINI PEN NEEDLES 31G X 5 MM MISC SMARTSIG:1 Each SUB-Q Daily   busPIRone (BUSPAR) 7.5 mg, Oral, 2  times daily   DULoxetine (CYMBALTA) 60 mg, Oral, Daily   ezetimibe (ZETIA) 10 mg, Oral, Daily   Ferrous Sulfate Dried (HIGH POTENCY IRON) 65 MG TABS 1 tablet, Oral, Daily   fesoterodine (TOVIAZ) 8 mg, Oral, Every evening   fluticasone (FLONASE) 50 MCG/ACT nasal spray 1 spray, Each Nare, 2 times daily PRN   FREESTYLE LITE test strip No dose, route, or frequency recorded.   furosemide (LASIX) 20 MG tablet TAKE 1 TABLET DAILY   gabapentin (NEURONTIN) 300 mg, Oral, 2 times daily   glimepiride (AMARYL) 1 mg, Oral, Daily   hydrOXYzine (ATARAX) 50 mg, Oral, 3 times daily   isosorbide dinitrate (ISORDIL) 30 MG tablet TAKE 1 TABLET THREE TIMES A DAY   Lantus SoloStar 40 Units, Subcutaneous, Daily before breakfast   metoprolol succinate (TOPROL-XL) 12.5 mg, Oral, 2 times daily   mirabegron ER (MYRBETRIQ) 50 mg, Oral, Daily   NAMZARIC 28-10 MG CP24 TAKE 1 CAPSULE DAILY   nitroGLYCERIN (NITROSTAT) 0.4 mg, Sublingual, Every 5 min PRN   pantoprazole (PROTONIX) 40 mg, Oral, Every evening   potassium chloride SA (KLOR-CON) 20 MEQ tablet TAKE 1 TABLET DAILY   rosuvastatin (CRESTOR) 10 mg, Oral, Daily before supper   sitaGLIPtin (JANUVIA) 100 mg, Oral, Daily   vitamin B-12 (CYANOCOBALAMIN) 1,000 mcg, Oral, Daily    Radiology:   CT Angio Neck 08/23/2018: 1. Left subclavian origin high-grade stenosis with delayed enhancement of the distal left vertebral artery. Additionally, there is irregular plaque/mural thrombus involving the aortic isthmus and left subclavian origin. 2. Left PCA branch occlusion correlating with the known acute infarct. 3. Cervical carotid atherosclerosis with up to 50% stenosis at the left ICA bulb. 4. 3 cm right thyroid mass, recommend elective ultrasound.  CXR PA/Lat 05/10/2019: The heart size and mediastinal contours are within normal limits. Prior CABG again noted. Aortic atherosclerosis incidentally noted. Cardiac loop recorder is seen anterior chest wall. Both lungs are  clear. The visualized skeletal structures are unremarkable.  Chest x-ray 10/07/2019: Stable cardiomediastinal silhouette. No pneumothorax or pleural effusion is noted. Both lungs are clear. The visualized skeletal structures are unremarkable IMPRESSION: No active cardiopulmonary disease.  Cardiac Studies:   Coronary angiogram 1996: [place] 80% stenosis ostial Left main, 30% stenosis proximal LAD, 80% stenosis proximal RCA CABG  -  LIMA to LAD, SVG to int, SVG to [place], [place]   Lexiscan Tetrofosmin Stress Test  05/15/2019: Nondiagnostic ECG stress. Myocardial  perfusion is abnormal. Mild degree large extent perfusion defect consistent with moderate ischemia located in the apical inferior wall, mid inferior wall, mid inferoseptal wall, basal inferior wall and basal inferoseptal wall (Right Coronary Artery region) of left ventricle. Gated SPECT imaging of the left ventricle was normal,  demonstrating hypokinesis of the apical inferior wall, mid inferior wall and basal inferior wall.  Stress LV EF is normal 55%.  Compared to the study done on 10/24/2016, previously minimal ischemia was evident and LVEF was 72%.  Low risk.  Echocardiogram 11/27/2019: Normal LV systolic function with visual EF 50-55%. Left ventricle cavity is normal in size. Mild left ventricular hypertrophy. Normal global wall motion. Doppler evidence of grade II diastolic dysfunction, elevated LAP. Left atrial cavity is mildly dilated. Right atrial cavity is slightly dilated. Moderate (Grade II) aortic regurgitation. Mild (Grade I) mitral regurgitation. Moderate tricuspid regurgitation. Mild pulmonary hypertension. RVSP measures 48 mmHg. Moderate pulmonic regurgitation. IVC is dilated with a respiratory response of <50%. No significant changes compared to prior study dated 03/22/2019.  Carotid artery duplex 11/27/2019: Stenosis in the right internal carotid artery (50-69%). Stenosis in the right external  carotid artery (<50%). Stenosis in the left internal carotid artery (16-49%). Stenosis in the left external carotid artery (<50%) Antegrade right vertebral artery flow. Antegrade left vertebral artery flow. Compared to the study done on 03/22/2019, no significant change. Follow up in six months is appropriate if clinically indicated.  Device Clinic: [employer] Loop recorder   Remote loop recorder transmission 12/15/2020: Predominant rhythm is normal sinus rhythm. There are frequent paroxysmal episodes of atrial fibrillation, AT/AF burden 1.2%. Since 09/15/2020, there has been 2 episodes of sustained atrial fibrillation longest lasting 2 hours and 48 minutes. Mean ventricular rate 102 bpm.  EKG  EKG 01/14/2021: Probably sinus rhythm with left atrial abnormality.  Normal axis, incomplete right bundle branch block.  Nonspecific inferior and lateral ST segment depression, consider subendocardial ischemia versus infarct.  Consider digitalis effect.  EKG similar to 10/01/2019, on 06/23/2020, she had right bundle branch block.  Assessment     ICD-10-CM   1. Paroxysmal atrial fibrillation (HCC)  I48.0 EKG 12-Lead    2. Coronary artery disease involving native coronary artery of native heart without angina pectoris  I25.10     3. Chronic diastolic (congestive) heart failure (HCC)  I50.32     4. Loop recorder [employer] Reveal [facility]  08/24/2018  Z98.890     5. Abnormal EKG  R94.31     6. OSA on CPAP  G47.33 Ambulatory referral to Sleep Studies   Z99.89       CHA2DS2-VASc Score is 7.  Yearly risk of stroke: > 9.8% (F, A, HTN, DM, Vasc Dz).  Score of 1=0.6; 2=2.2; 3=3.2; 4=4.8; 5=7.2; 6=9.8; 7=>9.8) -(CHF; HTN; vasc disease DM,  Female = 1; Age <65 =0; 65-74 = 1,  >75 =2; stroke/embolism= 2).    No orders of the defined types were placed in this encounter.    Medications Discontinued During This Encounter  Medication Reason   valsartan (DIOVAN) 160 MG tablet    ferrous sulfate 325 (65 FE) MG tablet       Recommendations:   TORRA PALA  is a 88 y.o. Caucasian female patient with CAD s/p CABG x 3 in 1996 in [place], HTN, hyperlipidemia, diabetes with PVD and stage 3a CKD and sleep apnea on CPAP and now compliant, carotid stenosis, and history of  autonomic orthostatic hypotension, CVA with medial left occipital infarct in embolic pattern  by MRI in 2020 and loop recorder implanted showed paroxysmal atrial fibrillation, for which she is now on Eliquis.  She is presently doing well without any clinical evidence of heart failure.  She has mild leg edema, she has had 1 episode of leg oozing about 2 months ago.  I have discussed with them regarding use of furosemide twice daily in the event she gains >2 pounds.  Heart failure diet discussed in detail.  Daily weights to be measured, I have discussed with him regarding maintaining a graph.  With regard to atrial fibrillation, she is maintaining sinus rhythm/ectopic atrial rhythm, she is tolerating anticoagulation without bleeding diathesis.  External labs reviewed.  She has mild chronic renal insufficiency but otherwise stable labs including CBC.  Lipids are well controlled, she has known coronary disease without recurrence of angina pectoris.  Loop recorder also does not reveal any significant worsening of atrial fibrillation burden.  We will continue remote monitoring.  She has bilateral carotid artery stenosis.  I have discussed with the patient's daughter and also with the patient that surveillance has been stopped due to her advanced age with high risk for any kind of interventions.  End-of-life discussions were held, patient is still very active, if she has irreversible medical condition including stroke or massive myocardial infarction, she would prefer to be DNR.  She does have living will and she will provide a copy to [place].  Patient's daughter present.  I will see her back in 6 months.  This is a 40-minute face-to-face encounter including discussions  regarding end-of-life issues.  She requested a referral back to Dr. Brett Fairy in view of prior stroke, evaluation of sleep apnea, she has been compliant with CPAP.   Adrian Prows, MD, [facility] 01/14/2021, 2:07 PM Office: [phone] Pager: [phone]

## 2021-01-15 DIAGNOSIS — I6340 Cerebral infarction due to embolism of unspecified cerebral artery: Secondary | ICD-10-CM | POA: Diagnosis not present

## 2021-01-15 DIAGNOSIS — Z95818 Presence of other cardiac implants and grafts: Secondary | ICD-10-CM | POA: Diagnosis not present

## 2021-01-15 DIAGNOSIS — I480 Paroxysmal atrial fibrillation: Secondary | ICD-10-CM | POA: Diagnosis not present

## 2021-01-15 DIAGNOSIS — Z4509 Encounter for adjustment and management of other cardiac device: Secondary | ICD-10-CM | POA: Diagnosis not present

## 2021-01-18 DIAGNOSIS — E1149 Type 2 diabetes mellitus with other diabetic neurological complication: Secondary | ICD-10-CM | POA: Diagnosis not present

## 2021-01-18 DIAGNOSIS — Z794 Long term (current) use of insulin: Secondary | ICD-10-CM | POA: Diagnosis not present

## 2021-01-18 DIAGNOSIS — N1831 Chronic kidney disease, stage 3a: Secondary | ICD-10-CM | POA: Diagnosis not present

## 2021-01-18 DIAGNOSIS — E1142 Type 2 diabetes mellitus with diabetic polyneuropathy: Secondary | ICD-10-CM | POA: Diagnosis not present

## 2021-01-18 DIAGNOSIS — Z7901 Long term (current) use of anticoagulants: Secondary | ICD-10-CM | POA: Diagnosis not present

## 2021-01-18 DIAGNOSIS — I2510 Atherosclerotic heart disease of native coronary artery without angina pectoris: Secondary | ICD-10-CM | POA: Diagnosis not present

## 2021-01-18 DIAGNOSIS — I4891 Unspecified atrial fibrillation: Secondary | ICD-10-CM | POA: Diagnosis not present

## 2021-01-18 DIAGNOSIS — G629 Polyneuropathy, unspecified: Secondary | ICD-10-CM | POA: Diagnosis not present

## 2021-01-18 DIAGNOSIS — I700 Atherosclerosis of aorta: Secondary | ICD-10-CM | POA: Diagnosis not present

## 2021-01-18 DIAGNOSIS — R82998 Other abnormal findings in urine: Secondary | ICD-10-CM | POA: Diagnosis not present

## 2021-01-18 DIAGNOSIS — I119 Hypertensive heart disease without heart failure: Secondary | ICD-10-CM | POA: Diagnosis not present

## 2021-01-18 DIAGNOSIS — I2721 Secondary pulmonary arterial hypertension: Secondary | ICD-10-CM | POA: Diagnosis not present

## 2021-01-18 DIAGNOSIS — Z0000 Encounter for general adult medical examination without abnormal findings: Secondary | ICD-10-CM | POA: Diagnosis not present

## 2021-01-28 ENCOUNTER — Encounter (INDEPENDENT_AMBULATORY_CARE_PROVIDER_SITE_OTHER): Payer: Medicare Other | Admitting: Ophthalmology

## 2021-01-29 ENCOUNTER — Other Ambulatory Visit: Payer: Self-pay | Admitting: Cardiology

## 2021-01-29 ENCOUNTER — Other Ambulatory Visit: Payer: Self-pay | Admitting: Neurology

## 2021-01-29 DIAGNOSIS — I5032 Chronic diastolic (congestive) heart failure: Secondary | ICD-10-CM

## 2021-02-04 ENCOUNTER — Encounter (INDEPENDENT_AMBULATORY_CARE_PROVIDER_SITE_OTHER): Payer: Medicare Other | Admitting: Ophthalmology

## 2021-02-15 DIAGNOSIS — Z95818 Presence of other cardiac implants and grafts: Secondary | ICD-10-CM | POA: Diagnosis not present

## 2021-02-15 DIAGNOSIS — Z4509 Encounter for adjustment and management of other cardiac device: Secondary | ICD-10-CM | POA: Diagnosis not present

## 2021-02-15 DIAGNOSIS — I480 Paroxysmal atrial fibrillation: Secondary | ICD-10-CM | POA: Diagnosis not present

## 2021-02-15 DIAGNOSIS — I6340 Cerebral infarction due to embolism of unspecified cerebral artery: Secondary | ICD-10-CM | POA: Diagnosis not present

## 2021-02-18 ENCOUNTER — Encounter (INDEPENDENT_AMBULATORY_CARE_PROVIDER_SITE_OTHER): Payer: Medicare Other | Admitting: Ophthalmology

## 2021-03-03 DIAGNOSIS — Z87891 Personal history of nicotine dependence: Secondary | ICD-10-CM | POA: Diagnosis not present

## 2021-03-03 DIAGNOSIS — I2584 Coronary atherosclerosis due to calcified coronary lesion: Secondary | ICD-10-CM | POA: Diagnosis not present

## 2021-03-03 DIAGNOSIS — E7800 Pure hypercholesterolemia, unspecified: Secondary | ICD-10-CM | POA: Diagnosis not present

## 2021-03-03 DIAGNOSIS — Z66 Do not resuscitate: Secondary | ICD-10-CM | POA: Diagnosis not present

## 2021-03-03 DIAGNOSIS — Z91040 Latex allergy status: Secondary | ICD-10-CM | POA: Diagnosis not present

## 2021-03-03 DIAGNOSIS — Z885 Allergy status to narcotic agent status: Secondary | ICD-10-CM | POA: Diagnosis not present

## 2021-03-03 DIAGNOSIS — I5033 Acute on chronic diastolic (congestive) heart failure: Secondary | ICD-10-CM | POA: Diagnosis not present

## 2021-03-03 DIAGNOSIS — I6523 Occlusion and stenosis of bilateral carotid arteries: Secondary | ICD-10-CM | POA: Diagnosis not present

## 2021-03-03 DIAGNOSIS — N1831 Chronic kidney disease, stage 3a: Secondary | ICD-10-CM | POA: Diagnosis not present

## 2021-03-03 DIAGNOSIS — I080 Rheumatic disorders of both mitral and aortic valves: Secondary | ICD-10-CM | POA: Diagnosis not present

## 2021-03-03 DIAGNOSIS — I213 ST elevation (STEMI) myocardial infarction of unspecified site: Secondary | ICD-10-CM | POA: Diagnosis not present

## 2021-03-03 DIAGNOSIS — M542 Cervicalgia: Secondary | ICD-10-CM | POA: Diagnosis not present

## 2021-03-03 DIAGNOSIS — I499 Cardiac arrhythmia, unspecified: Secondary | ICD-10-CM | POA: Diagnosis not present

## 2021-03-03 DIAGNOSIS — R079 Chest pain, unspecified: Secondary | ICD-10-CM | POA: Diagnosis not present

## 2021-03-03 DIAGNOSIS — E1122 Type 2 diabetes mellitus with diabetic chronic kidney disease: Secondary | ICD-10-CM | POA: Diagnosis not present

## 2021-03-03 DIAGNOSIS — Z8249 Family history of ischemic heart disease and other diseases of the circulatory system: Secondary | ICD-10-CM | POA: Diagnosis not present

## 2021-03-03 DIAGNOSIS — I252 Old myocardial infarction: Secondary | ICD-10-CM | POA: Diagnosis not present

## 2021-03-03 DIAGNOSIS — I083 Combined rheumatic disorders of mitral, aortic and tricuspid valves: Secondary | ICD-10-CM | POA: Diagnosis not present

## 2021-03-03 DIAGNOSIS — I69312 Visuospatial deficit and spatial neglect following cerebral infarction: Secondary | ICD-10-CM | POA: Diagnosis not present

## 2021-03-03 DIAGNOSIS — Z20822 Contact with and (suspected) exposure to covid-19: Secondary | ICD-10-CM | POA: Diagnosis not present

## 2021-03-03 DIAGNOSIS — E1151 Type 2 diabetes mellitus with diabetic peripheral angiopathy without gangrene: Secondary | ICD-10-CM | POA: Diagnosis not present

## 2021-03-03 DIAGNOSIS — Z7901 Long term (current) use of anticoagulants: Secondary | ICD-10-CM | POA: Diagnosis not present

## 2021-03-03 DIAGNOSIS — R0789 Other chest pain: Secondary | ICD-10-CM | POA: Diagnosis not present

## 2021-03-03 DIAGNOSIS — Z951 Presence of aortocoronary bypass graft: Secondary | ICD-10-CM | POA: Diagnosis not present

## 2021-03-03 DIAGNOSIS — I214 Non-ST elevation (NSTEMI) myocardial infarction: Secondary | ICD-10-CM | POA: Diagnosis not present

## 2021-03-03 DIAGNOSIS — Z79899 Other long term (current) drug therapy: Secondary | ICD-10-CM | POA: Diagnosis not present

## 2021-03-03 DIAGNOSIS — E1140 Type 2 diabetes mellitus with diabetic neuropathy, unspecified: Secondary | ICD-10-CM | POA: Diagnosis not present

## 2021-03-03 DIAGNOSIS — I130 Hypertensive heart and chronic kidney disease with heart failure and stage 1 through stage 4 chronic kidney disease, or unspecified chronic kidney disease: Secondary | ICD-10-CM | POA: Diagnosis not present

## 2021-03-03 DIAGNOSIS — I480 Paroxysmal atrial fibrillation: Secondary | ICD-10-CM | POA: Diagnosis not present

## 2021-03-03 DIAGNOSIS — I25810 Atherosclerosis of coronary artery bypass graft(s) without angina pectoris: Secondary | ICD-10-CM | POA: Diagnosis not present

## 2021-03-03 DIAGNOSIS — I2510 Atherosclerotic heart disease of native coronary artery without angina pectoris: Secondary | ICD-10-CM | POA: Diagnosis not present

## 2021-03-03 DIAGNOSIS — G4733 Obstructive sleep apnea (adult) (pediatric): Secondary | ICD-10-CM | POA: Diagnosis not present

## 2021-03-03 DIAGNOSIS — H5462 Unqualified visual loss, left eye, normal vision right eye: Secondary | ICD-10-CM | POA: Diagnosis not present

## 2021-03-04 ENCOUNTER — Telehealth: Payer: Self-pay | Admitting: Cardiology

## 2021-03-04 DIAGNOSIS — I083 Combined rheumatic disorders of mitral, aortic and tricuspid valves: Secondary | ICD-10-CM | POA: Diagnosis not present

## 2021-03-04 DIAGNOSIS — I6523 Occlusion and stenosis of bilateral carotid arteries: Secondary | ICD-10-CM | POA: Diagnosis not present

## 2021-03-04 NOTE — Telephone Encounter (Signed)
Patient went to visit her friend in [place], had markedly elevated blood pressure and some mild chest discomfort and dyspnea, was taken to [facility], troponins mildly positive, was recommended cardiac catheterization.  Patient refused, patient's daughter also called me here in the office and discussed representation.  They prefer continued medical therapy and if she does indeed need intervention or catheterization, they prefer me to do this.  I reviewed the care everywhere chart, appears the patient is chest pain-free.  Advised the patient's daughter Freda Munro that as long as she is chest pain-free, no heart failure, blood pressure is controlled, she could be discharged home with continued medical therapy in view of advanced age, patient preference and family preference.  We will try to set her up to see me in a week to 10 days.

## 2021-03-10 ENCOUNTER — Ambulatory Visit: Payer: Medicare Other | Admitting: Neurology

## 2021-03-11 ENCOUNTER — Encounter (INDEPENDENT_AMBULATORY_CARE_PROVIDER_SITE_OTHER): Payer: Self-pay | Admitting: Ophthalmology

## 2021-03-11 ENCOUNTER — Ambulatory Visit (INDEPENDENT_AMBULATORY_CARE_PROVIDER_SITE_OTHER): Payer: Medicare Other | Admitting: Ophthalmology

## 2021-03-11 ENCOUNTER — Other Ambulatory Visit: Payer: Self-pay

## 2021-03-11 ENCOUNTER — Encounter: Payer: Self-pay | Admitting: Student

## 2021-03-11 ENCOUNTER — Ambulatory Visit: Payer: Medicare Other | Admitting: Student

## 2021-03-11 VITALS — BP 144/44 | HR 63 | Temp 98.0°F | Resp 17 | Ht 60.0 in | Wt 189.0 lb

## 2021-03-11 DIAGNOSIS — H35371 Puckering of macula, right eye: Secondary | ICD-10-CM | POA: Diagnosis not present

## 2021-03-11 DIAGNOSIS — I5032 Chronic diastolic (congestive) heart failure: Secondary | ICD-10-CM

## 2021-03-11 DIAGNOSIS — I2510 Atherosclerotic heart disease of native coronary artery without angina pectoris: Secondary | ICD-10-CM

## 2021-03-11 DIAGNOSIS — I480 Paroxysmal atrial fibrillation: Secondary | ICD-10-CM | POA: Diagnosis not present

## 2021-03-11 DIAGNOSIS — Z9889 Other specified postprocedural states: Secondary | ICD-10-CM | POA: Diagnosis not present

## 2021-03-11 DIAGNOSIS — H43811 Vitreous degeneration, right eye: Secondary | ICD-10-CM | POA: Diagnosis not present

## 2021-03-11 DIAGNOSIS — H4312 Vitreous hemorrhage, left eye: Secondary | ICD-10-CM | POA: Diagnosis not present

## 2021-03-11 DIAGNOSIS — H43812 Vitreous degeneration, left eye: Secondary | ICD-10-CM | POA: Diagnosis not present

## 2021-03-11 MED ORDER — CLOPIDOGREL BISULFATE 75 MG PO TABS: 75.0000 mg | ORAL_TABLET | Freq: Every day | ORAL | 3 refills | Status: AC

## 2021-03-11 NOTE — Assessment & Plan Note (Signed)
OD stable condition ?

## 2021-03-11 NOTE — Progress Notes (Signed)
Primary Physician/Referring:  Shon Baton, MD  Patient ID: Linda Oliver, female    DOB: [birth year], 88 y.o.   MRN: [id number]  Chief Complaint  Patient presents with   HOSPITAL FOLLOWUP   Paroxysmal atrial fibrillation [facility])   HPI:    Linda Oliver  is a 88 y.o. Caucasian female patient with CAD s/p CABG x 3 in 1996 in [place], HTN, hyperlipidemia, diabetes with PVD and stage 3a CKD and sleep apnea on CPAP and now compliant, carotid stenosis, and history of  autonomic orthostatic hypotension, CVA with medial left occipital infarct in embolic pattern by MRI in 2020 and loop recorder implanted showed paroxysmal atrial fibrillation, for which she is now on Eliquis.  Patient presents for follow-up after recent hospitalization at [facility] in [place].  Patient was visiting a friend when she had an episode of markedly elevated blood pressure with associated mild chest pain and dyspnea.  Patient's troponins were elevated during hospitalization, however she had opted for medical management until she became hypoxic and developed worsening shortness of breath.  Patient subsequently underwent cardiac catheterization for NSTEMI on 03/05/2021 noted severe multivessel CAD and she underwent successful PCI of proximal to distal RCA.  Patient was subsequently discharged with Eliquis and Plavix as well as Toprol-XL and Crestor.  Patient presents with her daughter present at bedside.  Patient states overall she is feeling fairly well.  Her primary concern is fatigue which is slowly improving since discharge.  She has had no recurrence of significant dyspnea and denies chest pain.  She is tolerating Eliquis and Plavix without bleeding diathesis.  Past Medical History:  Diagnosis Date   Anginal pain ([place])    occ   Anxiety    Broken ankle    Bullous pemphigus    CAD (coronary artery disease)    [place]   80% stenosis ostial Left main, 30% stenosis proximal LAD, 80% stenosis proximal RCA CABG  -  LIMA  to LAD, SVG to int, SVG to [place], [place]    Depression    Diabetic peripheral neuropathy ([place])    Encounter for loop recorder check 04/11/2019   GERD (gastroesophageal reflux disease)    H/O: stroke: Left occipital stroke and vision loss 08/24/2018   Headache(784.0)    hx migraines   Heart murmur    mvp   Hyperlipidemia    Hypertension    Hypertensive heart disease    Loop recorder Reveal 08/24/2018 04/11/2019   Scheduled Remote loop recorder check 03/10/2019: The presenting rhythm is sinus bradycardia. There were 0 symptomatic patient activations recorded. There were 275 AF episodes and 0 AT episodes recorded, representing a 4.2 % cumulative AT/AF burden. The longest episode lasted 00:03:40:00 in duration. Some false with PAC. There were 0 tachy episodes detected. There were 0 pause episodes detected. Th   Nasal bleeding    occ due to O2   OA (osteoarthritis)    Obesity (BMI 30-39.9)    Obstructive sleep apnea on CPAP    Osteopenia    Paroxysmal atrial fibrillation ([place]) 04/17/2019   Peripheral neuropathy    Seizure ([place])    2009   Sleep apnea    dx 57yr ago doesnt use cpap but uses O2 at nite 2L   Stones in the urinary tract    Type 2 diabetes mellitus with vascular disease ([place]    UTI (lower urinary tract infection)    Past Surgical History:  Procedure Laterality Date   ABDOMINAL HYSTERECTOMY  70's   CARDIAC CATHETERIZATION     CARPAL TUNNEL RELEASE Bilateral    CATARACT EXTRACTION W/PHACO Bilateral    CHOLECYSTECTOMY     CORONARY ARTERY BYPASS GRAFT  96   LOOP RECORDER INSERTION N/A 08/24/2018   Procedure: LOOP RECORDER INSERTION;  Surgeon: Constance Haw, MD;  Location: [facility];  Service: Cardiovascular;  Laterality: N/A;   SHOULDER ARTHROSCOPY     rt   SHOULDER ARTHROSCOPY WITH ROTATOR CUFF REPAIR AND SUBACROMIAL DECOMPRESSION  12/01/2011   Procedure: SHOULDER ARTHROSCOPY WITH ROTATOR CUFF REPAIR AND SUBACROMIAL DECOMPRESSION;  Surgeon: Nita Sells, MD;  Location: [facility];  Service: Orthopedics;  Laterality: Left;  shoulder debridement calcific tendonitis   Family History  Problem Relation Age of Onset   Coronary artery disease Mother    Heart disease Father    Cancer Father        skin   Cancer Brother    Cancer - Colon Brother        brain, Lymphoma    Social History   Tobacco Use   Smoking status: Former    Packs/day: 1.00    Years: 30.00    Pack years: 30.00    Types: Cigarettes    Quit date: 11/25/1986    Years since quitting: 34.3   Smokeless tobacco: Never  Substance Use Topics   Alcohol use: No   Marital Status: Widowed  ROS  Review of Systems  Cardiovascular:  Positive for irregular heartbeat (chronic) and leg swelling (improved). Negative for chest pain, claudication, dyspnea on exertion, orthopnea and paroxysmal nocturnal dyspnea.  Respiratory:  Positive for snoring (on CPAP).   Musculoskeletal:  Positive for joint pain (left elbow).  Genitourinary:  Positive for bladder incontinence (chronic).   Objective  Blood pressure (!) 144/44, pulse 63, temperature 98 F (36.7 C), temperature source Temporal, resp. rate 17, height 5' (1.524 m), weight 189 lb (85.7 kg), SpO2 93 %.  Vitals with BMI 03/11/2021 01/14/2021 06/23/2020  Height _0  _1  _2   Weight 189 lbs 172 lbs 13 oz 172 lbs 13 oz  BMI 36.91 12.45 80.99  Systolic 833 825 053  Diastolic 44 61 51  Pulse 63 76 60     Physical Exam Vitals reviewed.  Constitutional:      Appearance: She is well-developed.  Neck:     Vascular: Carotid bruit (bilateral) present. No JVD.  Cardiovascular:     Rate and Rhythm: Normal rate and regular rhythm.     Pulses: Intact distal pulses.          Radial pulses are 2+ on the right side and 2+ on the left side.       Posterior tibial pulses are 2+ on the right side and 2+ on the left side.     Heart sounds: Murmur heard.  Systolic murmur is present at the upper right sternal border.    No gallop.      Comments: Femoral access site well-healed without evidence of ecchymosis, hematoma, bruit. Pulmonary:     Effort: Pulmonary effort is normal. No accessory muscle usage or respiratory distress.     Breath sounds: Normal breath sounds. No wheezing, rhonchi or rales.  Chest:     Chest wall: Tenderness (location of loop recorder ) present.  Musculoskeletal:     Right shoulder: Normal.     Right lower leg: Edema (trace ankle edema) present.     Left lower leg: Edema (trace ankle edema) present.   Laboratory  examination:  External labs:  Troponin trend 03/03/21 --> 03/05/2021:  97 --> 220 -->  241 -->  193  03/06/2021: Sodium 138, potassium 4.4, BUN 16, creatinine 0.93, GFR 57 Hgb 11.9, HCT 37, platelet 212, MCV 84 Magnesium 1.9  03/04/2021: Total cholesterol 99, triglycerides 86, HDL 30, LDL 44 TSH 1.89 BNP 722 A1c 7.6%  01/14/2021:  GLUCOSE 102   60 - 110 BUN  13   5 - 23 CREATININE 0.9   0.3 - 1.5 eGFR Non-African American 59.4    SODIUM 142   135 - 148 POTASSIUM 4.4   3.5 - 5.3  CHOLESTEROL  108   100 - 200 TRIGLYCERIDES 80   30 - 149 HDL   36   40 - 60 LDL   56   < CHOL/HDL RATIO 3.0   <  TSH    2.29   HGB 11.5   12.0 - 18.0 HCT 36.3   37.0 - 51.0 MCV 88.1   80.0 - 97.0 MCH 28.0   26.0 - 32.0 MCHC 31.8   31.0 - 36.0 RDW 14.8   12.3 - 15.4 PLT 240   140 - 440  Allergies   Allergies  Allergen Reactions   Codeine     vomiting   Metformin     Other reaction(s): nausea, vomitting and diarrhea   Oxycodone-Acetaminophen     Other reaction(s): Unknown   Percocet [Oxycodone-Acetaminophen] Nausea And Vomiting   Tape     Other reaction(s): Unknown   Tolterodine Other (See Comments)   Latex Itching and Rash    EKG leads   Medications Prior to Visit:   Outpatient Medications Prior to Visit  Medication Sig Dispense Refill   acetaminophen (TYLENOL) 500 MG tablet Take 500 mg by mouth every 4 (four) hours as needed for moderate pain.     apixaban (ELIQUIS) 5 MG TABS  tablet Take 1 tablet (5 mg total) by mouth 2 (two) times daily. Will need to see MD for future refills. 180 tablet 1   B-D UF III MINI PEN NEEDLES 31G X 5 MM MISC SMARTSIG:1 Each SUB-Q Daily     busPIRone (BUSPAR) 7.5 MG tablet Take 7.5 mg by mouth 2 (two) times daily.     DULoxetine (CYMBALTA) 60 MG capsule Take 60 mg by mouth daily.     ezetimibe (ZETIA) 10 MG tablet Take 10 mg by mouth daily.     Ferrous Sulfate Dried (HIGH POTENCY IRON) 65 MG TABS Take 1 tablet by mouth daily.     fesoterodine (TOVIAZ) 8 MG TB24 tablet Take 8 mg by mouth every evening.      fluticasone (FLONASE) 50 MCG/ACT nasal spray Place 1 spray into both nostrils 2 (two) times daily as needed for allergies or rhinitis.     furosemide (LASIX) 20 MG tablet TAKE 1 TABLET DAILY 90 tablet 3   gabapentin (NEURONTIN) 300 MG capsule Take 300 mg by mouth 2 (two) times daily.     glimepiride (AMARYL) 1 MG tablet Take 1 mg by mouth daily.     hydrOXYzine (ATARAX/VISTARIL) 25 MG tablet Take 50 mg by mouth 3 (three) times daily.     Insulin Glargine (LANTUS SOLOSTAR) 100 UNIT/ML Solostar Pen Inject 37 Units into the skin daily before breakfast.     Iron-Vitamin C (IRON 100/C PO) Take by mouth.     isosorbide dinitrate (ISORDIL) 30 MG tablet TAKE 1 TABLET THREE TIMES A DAY 270 tablet 3   Loratadine (CLARITIN) 10 MG CAPS Take  by mouth.     metoprolol succinate (TOPROL-XL) 25 MG 24 hr tablet Take 12.5 mg by mouth 2 (two) times daily.     NAMZARIC 28-10 MG CP24 TAKE 1 CAPSULE DAILY 90 capsule 1   pantoprazole (PROTONIX) 40 MG tablet Take 40 mg by mouth every evening.      potassium chloride SA (KLOR-CON M) 20 MEQ tablet TAKE 1 TABLET DAILY 90 tablet 3   rosuvastatin (CRESTOR) 10 MG tablet Take 10 mg by mouth daily before supper.     sitaGLIPtin (JANUVIA) 100 MG tablet Take 100 mg by mouth daily.     Vibegron (GEMTESA) 75 MG TABS Take by mouth.     vitamin B-12 (CYANOCOBALAMIN) 1000 MCG tablet Take 1,000 mcg by mouth daily.      clopidogrel (PLAVIX) 75 MG tablet Take 75 mg by mouth daily.     FREESTYLE LITE test strip      nitroGLYCERIN (NITROSTAT) 0.4 MG SL tablet Place 1 tablet (0.4 mg total) under the tongue every 5 (five) minutes as needed for up to 25 days for chest pain. (Patient taking differently: Place 0.4 mg under the tongue every 5 (five) minutes as needed for chest pain.) 90 tablet 0   mirabegron ER (MYRBETRIQ) 50 MG TB24 tablet Take 50 mg by mouth daily.     No facility-administered medications prior to visit.   Final Medications at End of Visit    Current Meds  Medication Sig   acetaminophen (TYLENOL) 500 MG tablet Take 500 mg by mouth every 4 (four) hours as needed for moderate pain.   apixaban (ELIQUIS) 5 MG TABS tablet Take 1 tablet (5 mg total) by mouth 2 (two) times daily. Will need to see MD for future refills.   B-D UF III MINI PEN NEEDLES 31G X 5 MM MISC SMARTSIG:1 Each SUB-Q Daily   busPIRone (BUSPAR) 7.5 MG tablet Take 7.5 mg by mouth 2 (two) times daily.   DULoxetine (CYMBALTA) 60 MG capsule Take 60 mg by mouth daily.   ezetimibe (ZETIA) 10 MG tablet Take 10 mg by mouth daily.   Ferrous Sulfate Dried (HIGH POTENCY IRON) 65 MG TABS Take 1 tablet by mouth daily.   fesoterodine (TOVIAZ) 8 MG TB24 tablet Take 8 mg by mouth every evening.    fluticasone (FLONASE) 50 MCG/ACT nasal spray Place 1 spray into both nostrils 2 (two) times daily as needed for allergies or rhinitis.   furosemide (LASIX) 20 MG tablet TAKE 1 TABLET DAILY   gabapentin (NEURONTIN) 300 MG capsule Take 300 mg by mouth 2 (two) times daily.   glimepiride (AMARYL) 1 MG tablet Take 1 mg by mouth daily.   hydrOXYzine (ATARAX/VISTARIL) 25 MG tablet Take 50 mg by mouth 3 (three) times daily.   Insulin Glargine (LANTUS SOLOSTAR) 100 UNIT/ML Solostar Pen Inject 37 Units into the skin daily before breakfast.   Iron-Vitamin C (IRON 100/C PO) Take by mouth.   isosorbide dinitrate (ISORDIL) 30 MG tablet TAKE 1 TABLET THREE TIMES A DAY    Loratadine (CLARITIN) 10 MG CAPS Take by mouth.   metoprolol succinate (TOPROL-XL) 25 MG 24 hr tablet Take 12.5 mg by mouth 2 (two) times daily.   NAMZARIC 28-10 MG CP24 TAKE 1 CAPSULE DAILY   pantoprazole (PROTONIX) 40 MG tablet Take 40 mg by mouth every evening.    potassium chloride SA (KLOR-CON M) 20 MEQ tablet TAKE 1 TABLET DAILY   rosuvastatin (CRESTOR) 10 MG tablet Take 10 mg by mouth daily before supper.  sitaGLIPtin (JANUVIA) 100 MG tablet Take 100 mg by mouth daily.   Vibegron (GEMTESA) 75 MG TABS Take by mouth.   vitamin B-12 (CYANOCOBALAMIN) 1000 MCG tablet Take 1,000 mcg by mouth daily.   [DISCONTINUED] clopidogrel (PLAVIX) 75 MG tablet Take 75 mg by mouth daily.   Radiology:   CT Angio Neck 08/23/2018: 1. Left subclavian origin high-grade stenosis with delayed enhancement of the distal left vertebral artery. Additionally, there is irregular plaque/mural thrombus involving the aortic isthmus and left subclavian origin. 2. Left PCA branch occlusion correlating with the known acute infarct. 3. Cervical carotid atherosclerosis with up to 50% stenosis at the left ICA bulb. 4. 3 cm right thyroid mass, recommend elective ultrasound.  CXR PA/Lat 05/10/2019: The heart size and mediastinal contours are within normal limits. Prior CABG again noted. Aortic atherosclerosis incidentally noted. Cardiac loop recorder is seen anterior chest wall. Both lungs are clear. The visualized skeletal structures are unremarkable.  Chest x-ray 10/07/2019: Stable cardiomediastinal silhouette. No pneumothorax or pleural effusion is noted. Both lungs are clear. The visualized skeletal structures are unremarkable IMPRESSION: No active cardiopulmonary disease.  Cardiac Studies:   Coronary angiogram 1996: [place] 80% stenosis ostial Left main, 30% stenosis proximal LAD, 80% stenosis proximal RCA CABG  -  LIMA to LAD, SVG to int, SVG to [place], [place]   Lexiscan Tetrofosmin Stress Test   05/15/2019: Nondiagnostic ECG stress. Myocardial perfusion is abnormal. Mild degree large extent perfusion defect consistent with moderate ischemia located in the apical inferior wall, mid inferior wall, mid inferoseptal wall, basal inferior wall and basal inferoseptal wall (Right Coronary Artery region) of left ventricle. Gated SPECT imaging of the left ventricle was normal,  demonstrating hypokinesis of the apical inferior wall, mid inferior wall and basal inferior wall.  Stress LV EF is normal 55%.  Compared to the study done on 10/24/2016, previously minimal ischemia was evident and LVEF was 72%.  Low risk.  Carotid artery duplex 03/04/2021: < 50% ICA stenosis bilaterally. Moderate plaquing bilaterally at bifurcations/proximal to mid ICAs.  Antegrade right vertebral artery flow.  Retrograde left VA flow - suggestive of possible subclavian disease, though not obvious subclavian stenosis noted w/out evidence of elevated velocity.  Consider further imaging as clinically indicated. Compared to study 11/27/2019, no significant change.  Follow-up in 6 months is appropriate if clinically indicated  Echocardiogram 03/04/2021: 1.  Left ventricle: The cavity size is normal. There is mild concentric hypertrophy. Systolic function is normal. The estimated ejection fraction is 50-55%. Wall motion is normal; there are no regional wall motion abnormalities. Grade II diastolic dysfunction.  2.  Aortic valve: The valve is mildly thickened and mildly calcified. There is mild to moderate, 1-2+ regurgitation.  3.  Mitral valve: The valve leaflets are mildly thickened. There is moderate 2+ regurgitation.  4.  Right ventricle: The cavity size is normal. Systolic function is normal.  5.  Tricuspid valve: There is trivial, 2+ regurgitation.  6.  Pulmonary arteries: Systolic pressure is 69-79 mm Hg.  7.  Inferior vena cava: The IVC is dilated.  Compared to echocardiogram 11/27/2019: MR progressed from mild to  moderate, otherwise no significant change.  Device Clinic: [employer] Loop recorder   Remote loop recorder transmission 02/15/2021: Predominant rhythm is normal sinus rhythm. There were 2 episodes of atrial fibrillation, 1 hour 48 minutes and 4 hours duration AT/AF burden 7.6%.  Unscheduled (Alert) 01/31/2020:  6 hours 38 minutes of A. fib on 01/29/2020. Total AF burden 22% since last transmission 01/11/2020.  11/15/2019: 5 hrs PAF on 10/8. 12/10/2019: 2.7% PAF with controlled ventricular response, 28 AF episodes and longest 2 hrs, 22 mins on 12/07/2019. Patient is on anticoagulation and will turn of alert and watch burden ongoing  EKG  03/11/2021: Probable sinus rhythm with left atrial abnormality.  Left axis.  Right bundle branch block.  Single PAC.  Nonspecific inferior and lateral ST depression, consider ischemia.  EKG 01/14/2021: Probably sinus rhythm with left atrial abnormality.  Normal axis, incomplete right bundle branch block.  Nonspecific inferior and lateral ST segment depression, consider subendocardial ischemia versus infarct.  Consider digitalis effect.  EKG similar to 10/01/2019, on 06/23/2020, she had right bundle branch block.  Assessment     ICD-10-CM   1. Coronary artery disease involving native coronary artery of native heart without angina pectoris  I25.10     2. Chronic diastolic (congestive) heart failure (HCC)  I50.32     3. Paroxysmal atrial fibrillation (HCC)  I48.0 EKG 12-Lead      CHA2DS2-VASc Score is 7.  Yearly risk of stroke: > 9.8% (F, A, HTN, DM, Vasc Dz).  Score of 1=0.6; 2=2.2; 3=3.2; 4=4.8; 5=7.2; 6=9.8; 7=>9.8) -(CHF; HTN; vasc disease DM,  Female = 1; Age <65 =0; 65-74 = 1,  >75 =2; stroke/embolism= 2).    Meds ordered this encounter  Medications   clopidogrel (PLAVIX) 75 MG tablet    Sig: Take 1 tablet (75 mg total) by mouth daily.    Dispense:  90 tablet    Refill:  3     Medications Discontinued During This Encounter  Medication Reason   mirabegron  ER (MYRBETRIQ) 50 MG TB24 tablet    clopidogrel (PLAVIX) 75 MG tablet Reorder     Recommendations:   Linda Oliver  is a 88 y.o. Caucasian female patient with CAD s/p CABG x 3 in 1996 in [place], HTN, hyperlipidemia, diabetes with PVD and stage 3a CKD and sleep apnea on CPAP and now compliant, carotid stenosis, and history of  autonomic orthostatic hypotension, CVA with medial left occipital infarct in embolic pattern by MRI in 2020 and loop recorder implanted showed paroxysmal atrial fibrillation, for which she is now on Eliquis.  Patient presents for follow-up after recent hospitalization at [facility] 2/20 03-01-2021 - 03/06/2021 with mild chest pain and mildly elevated troponins, with subsequent PCI to RCA.  Patient is improving since discharge.  She is without specific complaints today.  Although blood pressure is mildly elevated in the office today it is typically well controlled, encourage patient to monitor blood pressure regularly at home and bring her log with her to her next office visit.  She is tolerating Eliquis and Plavix without bleeding diathesis, will continue this.  Patient will also continue rosuvastatin and Zetia, metoprolol.  Follow-up in 3 months, sooner if needed, for CAD with Dr. Einar Gip per patient preference.  I personally reviewed external records including lab results, provider notes, echocardiogram, and radiologic results.   Alethia Berthold, PA-C 03/12/2021, 1:50 PM Office: [phone]

## 2021-03-11 NOTE — Assessment & Plan Note (Signed)
Incidentally noted on exam and on color fundus photography ?

## 2021-03-11 NOTE — Assessment & Plan Note (Signed)
No signs of recurrent hemorrhage from this condition ?

## 2021-03-11 NOTE — Assessment & Plan Note (Signed)
Clear and avitric, reassured the patient that floaters in the left eye can occur but typically not after macular hole or in his repair.  These tend to be evanescent but not enough to cloud her vision. ?

## 2021-03-11 NOTE — Progress Notes (Signed)
03/11/2021     CHIEF COMPLAINT Patient presents for  Chief Complaint  Patient presents with   Posterior Vitreous Detachment      HISTORY OF PRESENT ILLNESS: Linda Oliver is a 88 y.o. female who presents to the clinic today for:   HPI   9 mos fu ou OCT FP. Patient states "my eyes seems to have gotten weaker, more blurred, and the left eye in particular gets clouded over. It stayed for two days, one day last week and it bothered me." Recent cardiac disease requiring hospitalization with successful therapy.   Last edited by Hurman Horn, MD on 03/11/2021 11:52 AM.      Referring physician: Shon Baton, MD [address] [place],  [place] [address]  HISTORICAL INFORMATION:   Selected notes from the MEDICAL RECORD NUMBER    Lab Results  Component Value Date   HGBA1C 8.7 (H) 08/23/2018     CURRENT MEDICATIONS: No current outpatient medications on file. (Ophthalmic Drugs)   No current facility-administered medications for this visit. (Ophthalmic Drugs)   Current Outpatient Medications (Other)  Medication Sig   acetaminophen (TYLENOL) 500 MG tablet Take 500 mg by mouth every 4 (four) hours as needed for moderate pain.   apixaban (ELIQUIS) 5 MG TABS tablet Take 1 tablet (5 mg total) by mouth 2 (two) times daily. Will need to see MD for future refills.   B-D UF III MINI PEN NEEDLES 31G X 5 MM MISC SMARTSIG:1 Each SUB-Q Daily   busPIRone (BUSPAR) 7.5 MG tablet Take 7.5 mg by mouth 2 (two) times daily.   DULoxetine (CYMBALTA) 60 MG capsule Take 60 mg by mouth daily.   ezetimibe (ZETIA) 10 MG tablet Take 10 mg by mouth daily.   Ferrous Sulfate Dried (HIGH POTENCY IRON) 65 MG TABS Take 1 tablet by mouth daily.   fesoterodine (TOVIAZ) 8 MG TB24 tablet Take 8 mg by mouth every evening.    fluticasone (FLONASE) 50 MCG/ACT nasal spray Place 1 spray into both nostrils 2 (two) times daily as needed for allergies or rhinitis.   FREESTYLE LITE test strip    furosemide (LASIX) 20 MG  tablet TAKE 1 TABLET DAILY   gabapentin (NEURONTIN) 300 MG capsule Take 300 mg by mouth 2 (two) times daily.   glimepiride (AMARYL) 1 MG tablet Take 1 mg by mouth daily.   hydrOXYzine (ATARAX/VISTARIL) 25 MG tablet Take 50 mg by mouth 3 (three) times daily.   Insulin Glargine (LANTUS SOLOSTAR) 100 UNIT/ML Solostar Pen Inject 40 Units into the skin daily before breakfast.    isosorbide dinitrate (ISORDIL) 30 MG tablet TAKE 1 TABLET THREE TIMES A DAY   metoprolol succinate (TOPROL-XL) 25 MG 24 hr tablet Take 12.5 mg by mouth 2 (two) times daily.   mirabegron ER (MYRBETRIQ) 50 MG TB24 tablet Take 50 mg by mouth daily.   NAMZARIC 28-10 MG CP24 TAKE 1 CAPSULE DAILY   nitroGLYCERIN (NITROSTAT) 0.4 MG SL tablet Place 1 tablet (0.4 mg total) under the tongue every 5 (five) minutes as needed for up to 25 days for chest pain. (Patient taking differently: Place 0.4 mg under the tongue every 5 (five) minutes as needed for chest pain.)   pantoprazole (PROTONIX) 40 MG tablet Take 40 mg by mouth every evening.    potassium chloride SA (KLOR-CON M) 20 MEQ tablet TAKE 1 TABLET DAILY   rosuvastatin (CRESTOR) 10 MG tablet Take 10 mg by mouth daily before supper.   sitaGLIPtin (JANUVIA) 100 MG tablet Take  100 mg by mouth daily.   vitamin B-12 (CYANOCOBALAMIN) 1000 MCG tablet Take 1,000 mcg by mouth daily.   No current facility-administered medications for this visit. (Other)      REVIEW OF SYSTEMS: ROS   Positive for: Cardiovascular Last edited by Hurman Horn, MD on 03/11/2021 11:50 AM.       ALLERGIES Allergies  Allergen Reactions   Percocet [Oxycodone-Acetaminophen] Nausea And Vomiting   Latex Itching and Rash    EKG leads    PAST MEDICAL HISTORY Past Medical History:  Diagnosis Date   Anginal pain ([place])    occ   Anxiety    Broken ankle    Bullous pemphigus    CAD (coronary artery disease)    [place]   80% stenosis ostial Left main, 30% stenosis proximal LAD, 80%  stenosis proximal RCA CABG  -  LIMA to LAD, SVG to int, SVG to [place], [place]    Depression    Diabetic peripheral neuropathy ([place])    Encounter for loop recorder check 04/11/2019   GERD (gastroesophageal reflux disease)    H/O: stroke: Left occipital stroke and vision loss 08/24/2018   Headache(784.0)    hx migraines   Heart murmur    mvp   Hyperlipidemia    Hypertension    Hypertensive heart disease    Loop recorder Reveal 08/24/2018 04/11/2019   Scheduled Remote loop recorder check 03/10/2019: The presenting rhythm is sinus bradycardia. There were 0 symptomatic patient activations recorded. There were 275 AF episodes and 0 AT episodes recorded, representing a 4.2 % cumulative AT/AF burden. The longest episode lasted 00:03:40:00 in duration. Some false with PAC. There were 0 tachy episodes detected. There were 0 pause episodes detected. Th   Nasal bleeding    occ due to O2   OA (osteoarthritis)    Obesity (BMI 30-39.9)    Obstructive sleep apnea on CPAP    Osteopenia    Paroxysmal atrial fibrillation ([place]) 04/17/2019   Peripheral neuropathy    Seizure ([place])    2009   Sleep apnea    dx 37yr ago doesnt use cpap but uses O2 at nite 2L   Stones in the urinary tract    Type 2 diabetes mellitus with vascular disease ([facility]    UTI (lower urinary tract infection)    Past Surgical History:  Procedure Laterality Date   ABDOMINAL HYSTERECTOMY  70's   CARDIAC CATHETERIZATION     CARPAL TUNNEL RELEASE Bilateral    CATARACT EXTRACTION W/PHACO Bilateral    CHOLECYSTECTOMY     CORONARY ARTERY BYPASS GRAFT  96   LOOP RECORDER INSERTION N/A 08/24/2018   Procedure: LOOP RECORDER INSERTION;  Surgeon: CConstance Haw MD;  [facility];  Service: Cardiovascular;  Laterality: N/A;   SHOULDER ARTHROSCOPY     rt   SHOULDER ARTHROSCOPY WITH ROTATOR CUFF REPAIR AND SUBACROMIAL DECOMPRESSION  12/01/2011   Procedure: SHOULDER ARTHROSCOPY WITH ROTATOR CUFF REPAIR AND SUBACROMIAL  DECOMPRESSION;  Surgeon: JNita Sells MD;  [facility]  Service: Orthopedics;  Laterality: Left;  shoulder debridement calcific tendonitis    FAMILY HISTORY Family History  Problem Relation Age of Onset   Coronary artery disease Mother    Heart disease Father    Cancer Father        skin   Cancer Brother    Cancer - Colon Brother        brain, Lymphoma    SOCIAL HISTORY Social  History   Tobacco Use   Smoking status: Former    Packs/day: 1.00    Years: 30.00    Pack years: 30.00    Types: Cigarettes    Quit date: 11/25/1986    Years since quitting: 34.3   Smokeless tobacco: Never  Vaping Use   Vaping Use: Never used  Substance Use Topics   Alcohol use: No   Drug use: No         OPHTHALMIC EXAM:  Base Eye Exam     Visual Acuity (ETDRS)       Right Left   Dist cc 20/30 20/60 +2   Dist ph cc  NI         Tonometry (Tonopen, 11:25 AM)       Right Left   Pressure 8 6         Pupils       Pupils Dark Light Shape React APD   Right PERRL 5 4 Round Brisk None   Left PERRL 5 4 Round Brisk None         Extraocular Movement       Right Left    Full Full         Neuro/Psych     Oriented x3: Yes   Mood/Affect: Normal         Dilation     Both eyes: 1.0% Mydriacyl, 2.5% Phenylephrine @ 11:25 AM           Slit Lamp and Fundus Exam     External Exam       Right Left   External Normal Normal         Slit Lamp Exam       Right Left   Lids/Lashes Normal Normal   Conjunctiva/Sclera White and quiet White and quiet   Cornea Clear Clear   Anterior Chamber Deep and quiet Deep and quiet   Iris Round and reactive Round and reactive   Lens Posterior chamber intraocular lens, Open posterior capsule Posterior chamber intraocular lens,, with heme in the cul de sac of capsule   Anterior Vitreous Normal Normal         Fundus Exam       Right Left   Posterior Vitreous Posterior vitreous detachment AVITRIC   Disc  Normal Drusen   C/D Ratio 0.35 0.55   Macula Epiretinal membrane seen on OCT is not clinically evident Old macular hole, close negative Watzke   Vessels Normal, no DR Normal, no DR   Periphery Normal Normal, no holes or tears            IMAGING AND PROCEDURES  Imaging and Procedures for 03/11/21  OCT, Retina - OU - Both Eyes       Right Eye Quality was good. Scan locations included subfoveal. Central Foveal Thickness: 281. Progression has been stable. Findings include abnormal foveal contour, epiretinal membrane.   Left Eye Quality was good. Scan locations included subfoveal. Central Foveal Thickness: 257. Findings include abnormal foveal contour.   Notes OD minor epiretinal membrane no impact on acuity observe  OS classic findings of post macular hole and its repair subtle atrophy temporally     Color Fundus Photography Optos - OU - Both Eyes       Right Eye Progression has been stable. Disc findings include normal observations. Macula : epiretinal membrane.   Left Eye Progression has been stable. Disc findings include normal observations. Macula : normal observations. Vessels : normal observations. Periphery : normal  observations.   Notes OD with posterior vitreous detachment, clear media and minor epiretinal membrane without topographic distortion  OS, history of macular hole, no evidence of MAC-TEL in this patient with known sleep apnea on CPAP             ASSESSMENT/PLAN:  History of vitrectomy Clear and avitric, reassured the patient that floaters in the left eye can occur but typically not after macular hole or in his repair.  These tend to be evanescent but not enough to cloud her vision.  Vitreous hemorrhage of left eye (HCC) No signs of recurrent hemorrhage from this condition  Right epiretinal membrane OD stable condition  Posterior vitreous detachment of right eye Incidentally noted on exam and on color fundus photography     ICD-10-CM    1. Posterior vitreous detachment of left eye  H43.812 OCT, Retina - OU - Both Eyes    Color Fundus Photography Optos - OU - Both Eyes    2. History of vitrectomy  Z98.890     3. Vitreous hemorrhage of left eye (HCC)  H43.12     4. Right epiretinal membrane  H35.371     5. Posterior vitreous detachment of right eye  H43.811       1.  OS with a history in the past of micro hyphema leading to dispersed hemorrhage    which has improved coincident with the patient no longer mashing rubbing and compressing the eye light likely had iris IOL chafe in the past  2.OD, history of epiretinal membrane no change over time  3.OS history of macular hole in his repair looks great  Ophthalmic Meds Ordered this visit:  No orders of the defined types were placed in this encounter.      No follow-ups on file.  There are no Patient Instructions on file for this visit.   Explained the diagnoses, plan, and follow up with the patient and they expressed understanding.  Patient expressed understanding of the importance of proper follow up care.   Clent Demark Cyndi Montejano M.D. Diseases & Surgery of the Retina and Vitreous [facility] [place] 03/11/21     Abbreviations: M myopia (nearsighted); A astigmatism; H hyperopia (farsighted); P presbyopia; Mrx spectacle prescription;  CTL contact lenses; OD right eye; OS left eye; OU both eyes  XT exotropia; ET esotropia; PEK punctate epithelial keratitis; PEE punctate epithelial erosions; DES dry eye syndrome; MGD meibomian gland dysfunction; ATs artificial tears; PFAT's preservative free artificial tears; [employer] nuclear sclerotic cataract; PSC posterior subcapsular cataract; ERM epi-retinal membrane; PVD posterior vitreous detachment; RD retinal detachment; DM diabetes mellitus; DR diabetic retinopathy; NPDR non-proliferative diabetic retinopathy; PDR proliferative diabetic retinopathy; CSME clinically significant macular edema; DME diabetic macular edema; dbh  dot blot hemorrhages; CWS cotton wool spot; POAG primary open angle glaucoma; C/D cup-to-disc ratio; HVF humphrey visual field; GVF goldmann visual field; OCT optical coherence tomography; IOP intraocular pressure; BRVO Branch retinal vein occlusion; CRVO central retinal vein occlusion; CRAO central retinal artery occlusion; BRAO branch retinal artery occlusion; RT retinal tear; SB scleral buckle; PPV pars plana vitrectomy; VH Vitreous hemorrhage; PRP panretinal laser photocoagulation; IVK intravitreal kenalog; VMT vitreomacular traction; MH Macular hole;  NVD neovascularization of the disc; NVE neovascularization elsewhere; AREDS age related eye disease study; ARMD age related macular degeneration; POAG primary open angle glaucoma; EBMD epithelial/anterior basement membrane dystrophy; ACIOL anterior chamber intraocular lens; IOL intraocular lens; PCIOL posterior chamber intraocular lens; Phaco/IOL phacoemulsification with intraocular lens placement; PRK photorefractive keratectomy; LASIK laser assisted  in situ keratomileusis; HTN hypertension; DM diabetes mellitus; COPD chronic obstructive pulmonary disease

## 2021-03-12 ENCOUNTER — Encounter: Payer: Self-pay | Admitting: Student

## 2021-03-12 DIAGNOSIS — E1151 Type 2 diabetes mellitus with diabetic peripheral angiopathy without gangrene: Secondary | ICD-10-CM | POA: Diagnosis not present

## 2021-03-12 DIAGNOSIS — N3946 Mixed incontinence: Secondary | ICD-10-CM | POA: Diagnosis not present

## 2021-03-12 DIAGNOSIS — D631 Anemia in chronic kidney disease: Secondary | ICD-10-CM | POA: Diagnosis not present

## 2021-03-12 DIAGNOSIS — I951 Orthostatic hypotension: Secondary | ICD-10-CM | POA: Diagnosis not present

## 2021-03-12 DIAGNOSIS — I129 Hypertensive chronic kidney disease with stage 1 through stage 4 chronic kidney disease, or unspecified chronic kidney disease: Secondary | ICD-10-CM | POA: Diagnosis not present

## 2021-03-12 DIAGNOSIS — I69398 Other sequelae of cerebral infarction: Secondary | ICD-10-CM | POA: Diagnosis not present

## 2021-03-12 DIAGNOSIS — M8580 Other specified disorders of bone density and structure, unspecified site: Secondary | ICD-10-CM | POA: Diagnosis not present

## 2021-03-12 DIAGNOSIS — H547 Unspecified visual loss: Secondary | ICD-10-CM | POA: Diagnosis not present

## 2021-03-12 DIAGNOSIS — I700 Atherosclerosis of aorta: Secondary | ICD-10-CM | POA: Diagnosis not present

## 2021-03-12 DIAGNOSIS — E041 Nontoxic single thyroid nodule: Secondary | ICD-10-CM | POA: Diagnosis not present

## 2021-03-12 DIAGNOSIS — I2510 Atherosclerotic heart disease of native coronary artery without angina pectoris: Secondary | ICD-10-CM | POA: Diagnosis not present

## 2021-03-12 DIAGNOSIS — M1990 Unspecified osteoarthritis, unspecified site: Secondary | ICD-10-CM | POA: Diagnosis not present

## 2021-03-12 DIAGNOSIS — E1122 Type 2 diabetes mellitus with diabetic chronic kidney disease: Secondary | ICD-10-CM | POA: Diagnosis not present

## 2021-03-12 DIAGNOSIS — E1142 Type 2 diabetes mellitus with diabetic polyneuropathy: Secondary | ICD-10-CM | POA: Diagnosis not present

## 2021-03-12 DIAGNOSIS — E785 Hyperlipidemia, unspecified: Secondary | ICD-10-CM | POA: Diagnosis not present

## 2021-03-12 DIAGNOSIS — E559 Vitamin D deficiency, unspecified: Secondary | ICD-10-CM | POA: Diagnosis not present

## 2021-03-12 DIAGNOSIS — I480 Paroxysmal atrial fibrillation: Secondary | ICD-10-CM | POA: Diagnosis not present

## 2021-03-12 DIAGNOSIS — G2581 Restless legs syndrome: Secondary | ICD-10-CM | POA: Diagnosis not present

## 2021-03-12 DIAGNOSIS — I4510 Unspecified right bundle-branch block: Secondary | ICD-10-CM | POA: Diagnosis not present

## 2021-03-12 DIAGNOSIS — D692 Other nonthrombocytopenic purpura: Secondary | ICD-10-CM | POA: Diagnosis not present

## 2021-03-12 DIAGNOSIS — I771 Stricture of artery: Secondary | ICD-10-CM | POA: Diagnosis not present

## 2021-03-12 DIAGNOSIS — E538 Deficiency of other specified B group vitamins: Secondary | ICD-10-CM | POA: Diagnosis not present

## 2021-03-12 DIAGNOSIS — D6869 Other thrombophilia: Secondary | ICD-10-CM | POA: Diagnosis not present

## 2021-03-12 DIAGNOSIS — N3281 Overactive bladder: Secondary | ICD-10-CM | POA: Diagnosis not present

## 2021-03-12 DIAGNOSIS — N1831 Chronic kidney disease, stage 3a: Secondary | ICD-10-CM | POA: Diagnosis not present

## 2021-03-12 DIAGNOSIS — I272 Pulmonary hypertension, unspecified: Secondary | ICD-10-CM | POA: Diagnosis not present

## 2021-03-12 DIAGNOSIS — R350 Frequency of micturition: Secondary | ICD-10-CM | POA: Diagnosis not present

## 2021-03-18 ENCOUNTER — Ambulatory Visit: Payer: Medicare Other | Admitting: Cardiology

## 2021-03-18 DIAGNOSIS — N1831 Chronic kidney disease, stage 3a: Secondary | ICD-10-CM | POA: Diagnosis not present

## 2021-03-18 DIAGNOSIS — E1142 Type 2 diabetes mellitus with diabetic polyneuropathy: Secondary | ICD-10-CM | POA: Diagnosis not present

## 2021-03-18 DIAGNOSIS — I480 Paroxysmal atrial fibrillation: Secondary | ICD-10-CM | POA: Diagnosis not present

## 2021-03-18 DIAGNOSIS — D631 Anemia in chronic kidney disease: Secondary | ICD-10-CM | POA: Diagnosis not present

## 2021-03-18 DIAGNOSIS — E1122 Type 2 diabetes mellitus with diabetic chronic kidney disease: Secondary | ICD-10-CM | POA: Diagnosis not present

## 2021-03-18 DIAGNOSIS — I129 Hypertensive chronic kidney disease with stage 1 through stage 4 chronic kidney disease, or unspecified chronic kidney disease: Secondary | ICD-10-CM | POA: Diagnosis not present

## 2021-03-19 DIAGNOSIS — D631 Anemia in chronic kidney disease: Secondary | ICD-10-CM | POA: Diagnosis not present

## 2021-03-19 DIAGNOSIS — E1122 Type 2 diabetes mellitus with diabetic chronic kidney disease: Secondary | ICD-10-CM | POA: Diagnosis not present

## 2021-03-19 DIAGNOSIS — E1142 Type 2 diabetes mellitus with diabetic polyneuropathy: Secondary | ICD-10-CM | POA: Diagnosis not present

## 2021-03-19 DIAGNOSIS — I129 Hypertensive chronic kidney disease with stage 1 through stage 4 chronic kidney disease, or unspecified chronic kidney disease: Secondary | ICD-10-CM | POA: Diagnosis not present

## 2021-03-19 DIAGNOSIS — N1831 Chronic kidney disease, stage 3a: Secondary | ICD-10-CM | POA: Diagnosis not present

## 2021-03-19 DIAGNOSIS — I480 Paroxysmal atrial fibrillation: Secondary | ICD-10-CM | POA: Diagnosis not present

## 2021-03-22 DIAGNOSIS — Z20822 Contact with and (suspected) exposure to covid-19: Secondary | ICD-10-CM | POA: Diagnosis not present

## 2021-03-24 ENCOUNTER — Ambulatory Visit: Payer: Medicare Other | Admitting: Neurology

## 2021-03-24 NOTE — Progress Notes (Deleted)
?[facility] ? ? ?Provider:  Dr Jaynee Eagles ?Referring Provider: Shon Baton, MD ?Primary Care Physician:  Precious Reel, MD ?CC:  Memory loss ? ?Interval history 11/16/2017: Patient is here for follow up,  She was diagnosed with sleep apnea. She is doing well with her cpap. She feels her memory is stable. Report shows compliance. She is using it.  She is feeling less tired. Stable. Formal memory testing recommended for this patient who has been c/o memory impairment for years however her memory remains stable as do her testing MMSEs.  ? ?MRI brain 09/27/2016: nothing acute, remote small infarct superior left cerebellar hemisphere, moderate cerebral atrophy and mild cerebellar atrophy. ? ? ?Interval history 11/08/2016: Feels her memory is stable.Discussed MCI, formal neurocognitive testing. Discussed clinical trials including [employer]. She is already on Aricept and Namenda. Feels her memory is stable. Here with her son-in-law who also provides information. Provided information all of the above. I also reviewed MRI of the brain and cervical spine. 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 She had some degenerative changes in the neck but no myelopathy or cervical stenosis. EMG nerve conduction study showed peripheral neuropathy, bilateral carpal tunnel syndrome but no etiology for her proximal weakness such as myopathy myositis or neuromuscular disorder. ?  ?Interval history 09/21/2016: She is here with weakness in the arms. She was taken off of crestor with no release. She has an aching in her arms, can be a 10/10. Started 3 months ago when she started using the piano. She wuite playing the piano. She has neck problems and a stiff neck. More in the forearm and proximal arm. No difficulty lifting arms overhead, doesn't get tired easily brushing hair, can't lift  the iron skillet. No problem with the legs. Symptoms are symmetric and she has tingling in the fingers. Worse during the day. No ptosis, diplopia, no new swallowing problems or new shortness or breath.  ?  ?Interval history 11/04/2015: She goes to [place] every weekend to visit her husband in the [place]. 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 They went to [facility] near [place] for a few days. Here with daughter who also provides information. It is dog friendly there and they like it their. Holidays at her daughter's house Freda Munro). Feels memory is stable. She is on Namzaric for memory. Cymbalta was increased for anxiety. She was "jumping out of her skin" which has helped with her anxiety. She takes b12 orally every day.   ?  ?Interval History 10/06/2014: She is doing great. The medication really works for her. She takes oral B12 daily. No side effects from the Namzaric. Feels her memory is stable, no progression. No behavioral symptoms. Some mild anxiety but nothing significant. She is still driving and goes to therapy. She still has some dizziness and is in therapy. She has a recumbent bike that she uses. She is on gabapentin and cymbalta for neuropathy. She is here with her daughter. ?  ?Interval update 02/19/2014:  Linda Oliver is a 88 y.o. female here as a follow up. She is a former patient of Dr. Janann Colonel. Has history of DM, HLD, HTN. Has neuropathy for which she takes Cymbalta and Gabapentin. Has occasional episodes of dizziness which she takes meclizine for. No known family history of cognitive decline. Has been told her B12 is low and has received injections in the past, currently  taking oral B12 daily. She feels great, feels Aricept is making a significant difference. No side effects from the medication. She has decreased her Neurontin since starting Cymbalta and would like to try and decrease further.  ?  ?10/09/2013 visit:  Linda Oliver is a 88 y.o. female here as a follow up from Dr. Virgina Jock for  cognitive decline. Initial visit was 03/2013 at which time she had an unremarkable brain MRI and lab workup. She was started on Aricept '5mg'$  daily. Returns today with continued concerns over short term memory. Patient notes continued difficulty with getting lost, has a hard time getting around. She will have good and bad days, some days she is more confused and disoriented. Has gotten lost driving a few times. Her daughter notes some improvement since being on the medication. She feels that the patient is more alert and interactive. Notes some gait instability and dizziness but no falls.  ?  ?Initial visit 04/09/2013: ?Daughter notes symptoms have been ongoing for a few years and have gotten progressively worse. Daughter notices she will be telling a story and then just forget what comes next. Trouble with short term memory, difficulty recalling what they just talked about. Currently driving, denies any difficulty, no accidents. Has noted some episodes of getting lost. Wakes up frequently overnight with need to urinate. She notes one episode of a visual hallucination, described as a hazy shadow. Notes some difficulty with remote memory but overall feels that is still intact.  ?  ?She currently lives alone, her daughter takes care of finances and medications, has been doing this for the past few years. Has been hospitalized a few times in the past with question of stroke, no stroke noted and daughter suspects patients may have been managing her medications incorrectly in the past.  ?  ?  ?  ?  ?Review of Systems: ?Patient complains of symptoms per HPI as well as the following symptoms: no CP, no SOB. Pertinent negatives and positives per HPI. All others negative. ?  ? ?Social History  ? ?Socioeconomic History  ? Marital status: Widowed  ?  Spouse name: Not on file  ? Number of children: 1  ? Years of education: 84  ? Highest education level: Not on file  ?Occupational History  ? Not on file  ?Tobacco Use  ? Smoking  status: Former  ?  Packs/day: 1.00  ?  Years: 30.00  ?  Pack years: 30.00  ?  Types: Cigarettes  ?  Quit date: 11/25/1986  ?  Years since quitting: 34.3  ? Smokeless tobacco: Never  ?Vaping Use  ? Vaping Use: Never used  ?Substance and Sexual Activity  ? Alcohol use: No  ? Drug use: No  ? Sexual activity: Yes  ?  Birth control/protection: Post-menopausal  ?Other Topics Concern  ? Not on file  ?Social History Narrative  ? Married, has 1 child  ? Caffeine use: none  ? ?Social Determinants of Health  ? ?Financial Resource Strain: Not on file  ?Food Insecurity: Not on file  ?Transportation Needs: Not on file  ?Physical Activity: Not on file  ?Stress: Not on file  ?Social Connections: Not on file  ?Intimate Partner Violence: Not on file  ? ? ?Family History  ?Problem Relation Age of Onset  ? Coronary artery disease Mother   ? Heart disease Father   ? Cancer Father   ?     skin  ? Cancer Brother   ? Cancer - Colon Brother   ?  brain, Lymphoma  ? ? ?Past Medical History:  ?Diagnosis Date  ? Anginal pain ([place])   ? occ  ? Anxiety   ? Broken ankle   ? Bullous pemphigus   ? CAD (coronary artery disease)   ? [place]   80% stenosis ostial Left main, 30% stenosis proximal LAD, 80% stenosis proximal RCA CABG  -  LIMA to LAD, SVG to int, SVG to [place], [place]   ? Depression   ? Diabetic peripheral neuropathy ([place])   ? Encounter for loop recorder check 04/11/2019  ? GERD (gastroesophageal reflux disease)   ? H/O: stroke: Left occipital stroke and vision loss 08/24/2018  ? Headache(784.0)   ? hx migraines  ? Heart murmur   ? mvp  ? Hyperlipidemia   ? Hypertension   ? Hypertensive heart disease   ? Loop recorder Reveal 08/24/2018 04/11/2019  ? Scheduled Remote loop recorder check 03/10/2019: The presenting rhythm is sinus bradycardia. There were 0 symptomatic patient activations recorded. There were 275 AF episodes and 0 AT episodes recorded, representing a 4.2 % cumulative AT/AF burden. The longest episode  lasted 00:03:40:00 in duration. Some false with PAC. There were 0 tachy episodes detected. There were 0 pause episodes detected. Th  ? Nasal bleeding   ? occ due to O2  ? OA (osteoarthritis)   ? Obesity (BMI 30-39.9

## 2021-03-25 ENCOUNTER — Encounter: Payer: Self-pay | Admitting: Neurology

## 2021-03-25 ENCOUNTER — Ambulatory Visit (INDEPENDENT_AMBULATORY_CARE_PROVIDER_SITE_OTHER): Payer: Medicare Other | Admitting: Neurology

## 2021-03-25 ENCOUNTER — Other Ambulatory Visit: Payer: Self-pay

## 2021-03-25 VITALS — BP 149/85 | HR 76 | Ht 60.0 in | Wt 171.5 lb

## 2021-03-25 DIAGNOSIS — N1831 Chronic kidney disease, stage 3a: Secondary | ICD-10-CM | POA: Diagnosis not present

## 2021-03-25 DIAGNOSIS — I2510 Atherosclerotic heart disease of native coronary artery without angina pectoris: Secondary | ICD-10-CM

## 2021-03-25 DIAGNOSIS — I5033 Acute on chronic diastolic (congestive) heart failure: Secondary | ICD-10-CM

## 2021-03-25 DIAGNOSIS — G4719 Other hypersomnia: Secondary | ICD-10-CM | POA: Diagnosis not present

## 2021-03-25 DIAGNOSIS — G4733 Obstructive sleep apnea (adult) (pediatric): Secondary | ICD-10-CM | POA: Diagnosis not present

## 2021-03-25 DIAGNOSIS — E1122 Type 2 diabetes mellitus with diabetic chronic kidney disease: Secondary | ICD-10-CM | POA: Diagnosis not present

## 2021-03-25 DIAGNOSIS — D631 Anemia in chronic kidney disease: Secondary | ICD-10-CM | POA: Diagnosis not present

## 2021-03-25 DIAGNOSIS — Z9989 Dependence on other enabling machines and devices: Secondary | ICD-10-CM | POA: Diagnosis not present

## 2021-03-25 DIAGNOSIS — G4734 Idiopathic sleep related nonobstructive alveolar hypoventilation: Secondary | ICD-10-CM | POA: Diagnosis not present

## 2021-03-25 DIAGNOSIS — I480 Paroxysmal atrial fibrillation: Secondary | ICD-10-CM | POA: Diagnosis not present

## 2021-03-25 DIAGNOSIS — I129 Hypertensive chronic kidney disease with stage 1 through stage 4 chronic kidney disease, or unspecified chronic kidney disease: Secondary | ICD-10-CM | POA: Diagnosis not present

## 2021-03-25 DIAGNOSIS — E1142 Type 2 diabetes mellitus with diabetic polyneuropathy: Secondary | ICD-10-CM | POA: Diagnosis not present

## 2021-03-25 NOTE — Patient Instructions (Addendum)
Screening for Sleep Apnea ?Sleep apnea is a condition in which breathing pauses or becomes shallow during sleep. Sleep apnea screening is a test to determine if you are at risk for sleep apnea. The test includes a series of questions. It will only takes a few minutes. Your health care provider may ask you to have this test in preparation for surgery or as part of a physical exam. ?What are the symptoms of sleep apnea? ?Common symptoms of sleep apnea include: ?Snoring. ?Waking up often at night. ?Daytime sleepiness. ?Pauses in breathing. ?Choking or gasping during sleep. ?Irritability. ?Forgetfulness. ?Trouble thinking clearly. ?Depression. ?Personality changes. ?Most people with sleep apnea do not know that they have it. ?What are the advantages of sleep apnea screening? ?Getting screened for sleep apnea can help: ?Ensure your safety. It is important for your health care providers to know whether or not you have sleep apnea, especially if you are having surgery or have other long-term (chronic) health conditions. ?Improve your health and allow you to get a better night's rest. Restful sleep can help you: ?Have more energy. ?Lose weight. ?Improve high blood pressure. ?Improve diabetes management. ?Prevent stroke. ?Prevent car accidents. ?What happens during the screening? ?Screening usually includes being asked a list of questions about your sleep quality. Some questions you may be asked include: ?Do you snore? ?Is your sleep restless? ?Do you have daytime sleepiness? ?Has a partner or spouse told you that you stop breathing during sleep? ?Have you had trouble concentrating or memory loss? ?What is your age? ?What is your neck circumference? ?To measure your neck, keep your back straight and gently wrap the tape measure around your neck. Put the tape measure at the middle of your neck, between your chin and collarbone. ?What is your sex assigned at birth? ?Do you have or are you being treated for high blood  pressure? ?If your screening test is positive, you are at risk for the condition. Further testing may be needed to confirm a diagnosis of sleep apnea. ?Where to find more information ?You can find screening tools online or at your health care clinic. For more information about sleep apnea screening and healthy sleep, visit these websites: ?Centers for Disease Control and Prevention: http://www.wolf.info/ ?American Sleep Apnea Association: www.sleepapnea.org ?Contact a health care provider if: ?You think that you may have sleep apnea. ?Summary ?Sleep apnea screening can help determine if you are at risk for sleep apnea. ?It is important for your health care providers to know whether or not you have sleep apnea, especially if you are having surgery or have other chronic health conditions. ?You may be asked to take a screening test for sleep apnea in preparation for surgery or as part of a physical exam. ?This information is not intended to replace advice given to you by your health care provider. Make sure you discuss any questions you have with your health care provider. ?Document Revised: 12/06/2019 Document Reviewed: 12/06/2019 ?Elsevier Patient Education ? [place]. ? ? ?Assessment:  After physical and neurologic examination, review of laboratory studies,  Personal review of imaging studies, reports of other /same  Imaging studies, results of polysomnography and / or neurophysiology testing and pre-existing records as far as provided in visit., my assessment is  ?  ?1) OSA may still be present - increased fatigue while CPAP and oxygen at home. 1 liter a minute.  ?  ?2) she insists on a HST , and reportedly has insomnia in strange environments. Her house feels safe and  she has her dogs there, too. She is willing to sleep one night without CPAP and without oxygen- nasal mask is used.  ?  ?  ?The patient was advised of the nature of the diagnosed disorder , the treatment options and the  risks for general health and  wellness arising from not treating the condition.  ?  ?I spent more than 35 minutes of face to face time with the patient. ? Greater than 50% of time was spent in counseling and coordination of care. We have discussed the diagnosis and differential and I answered the patient's questions.   ?  ?Plan:  Treatment plan and additional workup : ?HST , needs likely to return to lab for oxygen Titration, rule out central apnea.  ?  ?  ?Larey Seat, MD 03/25/2021, 2:06 PM  ?

## 2021-03-25 NOTE — Progress Notes (Signed)
?SLEEP MEDICINE CLINIC ? ? ?Provider:  Larey Seat, M D  ?Primary Care Physician:  Shon Baton, MD ? ? ?Referring Provider: Adrian Prows, MD  ? ? ?Chief Complaint  ?Patient presents with  ? Consult  ?  Rm 11, alone. Internal referral for OSA, on CPAP. Last SS done 07/2017.   ? ? ?HPI:  Linda Oliver is a 88 y.o. female , followed for MCI/ dementia by Dr.Ahern. She has also been seen by Dr. Leonie Man.  ?Seen here as in a referral  from Dr. Einar Gip for a sleep consultation. Loop recorder was implanted in 2020. ?CPAP feels as if not enough power is provided. Nasal CPAP mask.  ?She was last seen by Dr Einar Gip in January- but meanwhile, at the end of February, developed chest pain  while she was visiting [place]- was [facility]- and she underwent acardiac intervention, angioplasty and 2 stents were placed. She has been known to have atrial fibrillation, Eloquis anticoagulation.  ?This all happened just over the last 6 weeks- here to follow up on sleep. She is due for a new CPAP, has been using hers and did well so far. She wants to only have female attending should she have to come into the lab. Prefers HST. CPAP patient - Was on 8 cm water with 2 L of 02.  ? ? ?I had the pleasure of seeing Linda Oliver in 2010 when she underwent a polysomnography on 1 November of that year also referred by Dr. Manuella Ghazi. 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  ? At the time she was 88 years old with a history of diabetes, GERD, hypertension, neuropathy, coronary artery disease, pernicious anemia and had reported excessive daytime sleepiness and fatigue as well as observed apneas and snoring.  She had a triple bypass in the 1990s, cholecystectomy hysterectomy hand surgery for carpal tunnel, shoulder surgery on both sides the last hand surgery was in February 2019. ? ?The sleep study revealed an AHI of 10.6 only 8 clear apneas were noted and 66 hypopneas, there was no REM sleep seen, the patient was  placed on CPAP at the time and there was later oxygen ordered as well.  She was set up with [facility] but she states that she could not get service from [facility] over the last 2 years and then stopped using the machine.  The lowest oxygen saturation was 85% SPO2 was 25.7 minutes of total desaturation time, arousal index was 20.1. ? ?She was again undergoing a sleep test in 2019- in lab CPAP and oxygen titration- was titrated on ESON to 8 cm water with 2 liter oxygen. AHI was 17/h.  ? ?The patient reports that Linda Oliver sleep is still very fragmented and that since she is not using CPAP or oxygen now she does feel a little bit more sleepy and fatigued again.  She would like to return to treatment. ? ? ?The patient is established with Dr. Georgia Dom, see below quotation of last visit with Linda Oliver.  ?MRI brain 09/27/2016: nothing acute, remote small infarct superior left cerebellar hemisphere, moderate cerebral atrophy and mild cerebellar atrophy. ?Interval history 11/08/2016: Feels Linda Oliver memory is stable.Discussed MCI, formal neurocognitive testing. Discussed clinical trials including [employer]. She is already on Aricept and Namenda. Feels Linda Oliver memory is stable. Here with Linda Oliver son-in-law who also provides information. Provided information all of the above. I also reviewed MRI of the brain and cervical spine. MRI of  the brain showed an old chronic small vessel ischemic stroke in the cerebellum otherwise appeared to be atrophy consistent with age and small microvascular white matter changes. She had some degenerative changes in the neck but no myelopathy or cervical stenosis. EMG nerve conduction study showed peripheral neuropathy, bilateral carpal tunnel syndrome but no etiology for Linda Oliver proximal weakness such as myopathy myositis or neuromuscular disorder. ? ? ?Chief complaint according to patient :  ? ?Sleep habits are as follows: watches TV in the den, checks BP and takes Linda Oliver medication,  ?Linda Oliver bed time is at 11 Pm, no longer has  nocturia- . Sleep on either side, on 2 pillows. Adjustable bed user.  ?some time spontaneous arousals. The bedroom is cool, quiet and dark. She now lives  with Linda Oliver.  ?Sometimes she watches Tv in bed.  Linda Oliver dog sleeps on Linda Oliver bed with Linda Oliver.  ?She averages now 9 hours on CPAP of sleep.  ?She does feel refreshed after waking up but only temporarily.  ?By afternoon time she is exhausted, falls easily asleep. She may nap for 20 minutes.  ?She reports no dreams.  ?She talks in Linda Oliver sleep. Linda Oliver  did in 2020 ? ? ?Sleep medical history and family sleep history:  MCI , see above listed medical condition.  ? ?Social history: widowed after being married for 70 years , ? cannot drive anymore, non smoker, non drinker, caffeine in form of tea and coke. ? 1 Oliver, 2 grandchildren.  ? ?Review of Systems: ?Out of a complete 14 system review, the patient complains of only the following symptoms, and all other reviewed systems are negative. ? ?The patient endorsed fatigue, history of a detached retina was blurred vision easy bruising increased thirst, snoring joint pain incontinence allergies runny nose and skin sensitivity hearing loss anxiety depression decreased energy-interest in some activities some memory loss and insomnia as well as restless legs.  ? ? ? ? She endorsed the Epworth Sleepiness Scale 9/ 24 points, and the fatigue severity score of 59 points much higher.  The geriatric depression score was endorsed positive at 7 out of 15 points which is indicative for the presence of a mild depression. ? ? ?Social History  ? ?Socioeconomic History  ? Marital status: Widowed  ?  Linda Oliver: Not on file  ? Number of children: 1  ? Years of education: 15  ? Highest education level: Not on file  ?Occupational History  ? Not on file  ?Tobacco Use  ? Smoking status: Former  ?  Packs/day: 1.00  ?  Years: 30.00  ?  Pack years: 30.00  ?  Types: Cigarettes  ?  Quit date: 11/25/1986  ?  Years since quitting: 34.3  ? Smokeless  tobacco: Never  ?Vaping Use  ? Vaping Use: Never used  ?Substance and Sexual Activity  ? Alcohol use: No  ? Drug use: No  ? Sexual activity: Yes  ?  Birth control/protection: Post-menopausal  ?Other Topics Concern  ? Not on file  ?Social History Narrative  ? Married, has 1 child  ? Caffeine use: none  ? ?Social Determinants of Health  ? ?Financial Resource Strain: Not on file  ?Food Insecurity: Not on file  ?Transportation Needs: Not on file  ?Physical Activity: Not on file  ?Stress: Not on file  ?Social Connections: Not on file  ?Intimate Partner Violence: Not on file  ? ? ?Family History  ?Problem Relation Age of Onset  ? Coronary artery disease Mother   ?  Heart disease Father   ? Cancer Father   ?     skin  ? Cancer Brother   ? Cancer - Colon Brother   ?     brain, Lymphoma  ? ? ?Past Medical History:  ?Diagnosis Date  ? Anginal pain ([employer])   ? occ  ? Anxiety   ? Broken ankle   ? Bullous pemphigus   ? CAD (coronary artery disease)   ? [place]   80% stenosis ostial Left main, 30% stenosis proximal LAD, 80% stenosis proximal RCA CABG  -  LIMA to LAD, SVG to int, SVG to [place], [place]   ? Depression   ? Diabetic peripheral neuropathy ([place])   ? Encounter for loop recorder check 04/11/2019  ? GERD (gastroesophageal reflux disease)   ? H/O: stroke: Left occipital stroke and vision loss 08/24/2018  ? Headache(784.0)   ? hx migraines  ? Heart murmur   ? mvp  ? Hyperlipidemia   ? Hypertension   ? Hypertensive heart disease   ? Loop recorder Reveal 08/24/2018 04/11/2019  ? Scheduled Remote loop recorder check 03/10/2019: The presenting rhythm is sinus bradycardia. There were 0 symptomatic patient activations recorded. There were 275 AF episodes and 0 AT episodes recorded, representing a 4.2 % cumulative AT/AF burden. The longest episode lasted 00:03:40:00 in duration. Some false with PAC. There were 0 tachy episodes detected. There were 0 pause episodes detected. Th  ? Nasal bleeding   ? occ due to O2  ?  OA (osteoarthritis)   ? Obesity (BMI 30-39.9)   ? Obstructive sleep apnea on CPAP   ? Osteopenia   ? Paroxysmal atrial fibrillation ([place]) 04/17/2019  ? Peripheral neuropathy   ? Seizure [facility])   ? 2009  ?

## 2021-03-31 DIAGNOSIS — I129 Hypertensive chronic kidney disease with stage 1 through stage 4 chronic kidney disease, or unspecified chronic kidney disease: Secondary | ICD-10-CM | POA: Diagnosis not present

## 2021-03-31 DIAGNOSIS — N1831 Chronic kidney disease, stage 3a: Secondary | ICD-10-CM | POA: Diagnosis not present

## 2021-03-31 DIAGNOSIS — E1122 Type 2 diabetes mellitus with diabetic chronic kidney disease: Secondary | ICD-10-CM | POA: Diagnosis not present

## 2021-03-31 DIAGNOSIS — E1142 Type 2 diabetes mellitus with diabetic polyneuropathy: Secondary | ICD-10-CM | POA: Diagnosis not present

## 2021-03-31 DIAGNOSIS — D631 Anemia in chronic kidney disease: Secondary | ICD-10-CM | POA: Diagnosis not present

## 2021-03-31 DIAGNOSIS — I480 Paroxysmal atrial fibrillation: Secondary | ICD-10-CM | POA: Diagnosis not present

## 2021-04-07 DIAGNOSIS — H4312 Vitreous hemorrhage, left eye: Secondary | ICD-10-CM | POA: Diagnosis not present

## 2021-04-07 DIAGNOSIS — H538 Other visual disturbances: Secondary | ICD-10-CM | POA: Diagnosis not present

## 2021-04-07 DIAGNOSIS — H3561 Retinal hemorrhage, right eye: Secondary | ICD-10-CM | POA: Diagnosis not present

## 2021-04-07 DIAGNOSIS — H35373 Puckering of macula, bilateral: Secondary | ICD-10-CM | POA: Diagnosis not present

## 2021-04-08 DIAGNOSIS — I480 Paroxysmal atrial fibrillation: Secondary | ICD-10-CM | POA: Diagnosis not present

## 2021-04-08 DIAGNOSIS — D631 Anemia in chronic kidney disease: Secondary | ICD-10-CM | POA: Diagnosis not present

## 2021-04-08 DIAGNOSIS — N1831 Chronic kidney disease, stage 3a: Secondary | ICD-10-CM | POA: Diagnosis not present

## 2021-04-08 DIAGNOSIS — E1142 Type 2 diabetes mellitus with diabetic polyneuropathy: Secondary | ICD-10-CM | POA: Diagnosis not present

## 2021-04-08 DIAGNOSIS — E1122 Type 2 diabetes mellitus with diabetic chronic kidney disease: Secondary | ICD-10-CM | POA: Diagnosis not present

## 2021-04-08 DIAGNOSIS — I129 Hypertensive chronic kidney disease with stage 1 through stage 4 chronic kidney disease, or unspecified chronic kidney disease: Secondary | ICD-10-CM | POA: Diagnosis not present

## 2021-04-11 DIAGNOSIS — G2581 Restless legs syndrome: Secondary | ICD-10-CM | POA: Diagnosis not present

## 2021-04-11 DIAGNOSIS — E1122 Type 2 diabetes mellitus with diabetic chronic kidney disease: Secondary | ICD-10-CM | POA: Diagnosis not present

## 2021-04-11 DIAGNOSIS — I480 Paroxysmal atrial fibrillation: Secondary | ICD-10-CM | POA: Diagnosis not present

## 2021-04-11 DIAGNOSIS — I272 Pulmonary hypertension, unspecified: Secondary | ICD-10-CM | POA: Diagnosis not present

## 2021-04-11 DIAGNOSIS — E785 Hyperlipidemia, unspecified: Secondary | ICD-10-CM | POA: Diagnosis not present

## 2021-04-11 DIAGNOSIS — M8580 Other specified disorders of bone density and structure, unspecified site: Secondary | ICD-10-CM | POA: Diagnosis not present

## 2021-04-11 DIAGNOSIS — D692 Other nonthrombocytopenic purpura: Secondary | ICD-10-CM | POA: Diagnosis not present

## 2021-04-11 DIAGNOSIS — E559 Vitamin D deficiency, unspecified: Secondary | ICD-10-CM | POA: Diagnosis not present

## 2021-04-11 DIAGNOSIS — H547 Unspecified visual loss: Secondary | ICD-10-CM | POA: Diagnosis not present

## 2021-04-11 DIAGNOSIS — N3281 Overactive bladder: Secondary | ICD-10-CM | POA: Diagnosis not present

## 2021-04-11 DIAGNOSIS — E1142 Type 2 diabetes mellitus with diabetic polyneuropathy: Secondary | ICD-10-CM | POA: Diagnosis not present

## 2021-04-11 DIAGNOSIS — I2510 Atherosclerotic heart disease of native coronary artery without angina pectoris: Secondary | ICD-10-CM | POA: Diagnosis not present

## 2021-04-11 DIAGNOSIS — E1151 Type 2 diabetes mellitus with diabetic peripheral angiopathy without gangrene: Secondary | ICD-10-CM | POA: Diagnosis not present

## 2021-04-11 DIAGNOSIS — N1831 Chronic kidney disease, stage 3a: Secondary | ICD-10-CM | POA: Diagnosis not present

## 2021-04-11 DIAGNOSIS — E041 Nontoxic single thyroid nodule: Secondary | ICD-10-CM | POA: Diagnosis not present

## 2021-04-11 DIAGNOSIS — D6869 Other thrombophilia: Secondary | ICD-10-CM | POA: Diagnosis not present

## 2021-04-11 DIAGNOSIS — D631 Anemia in chronic kidney disease: Secondary | ICD-10-CM | POA: Diagnosis not present

## 2021-04-11 DIAGNOSIS — I700 Atherosclerosis of aorta: Secondary | ICD-10-CM | POA: Diagnosis not present

## 2021-04-11 DIAGNOSIS — I951 Orthostatic hypotension: Secondary | ICD-10-CM | POA: Diagnosis not present

## 2021-04-11 DIAGNOSIS — I771 Stricture of artery: Secondary | ICD-10-CM | POA: Diagnosis not present

## 2021-04-11 DIAGNOSIS — I129 Hypertensive chronic kidney disease with stage 1 through stage 4 chronic kidney disease, or unspecified chronic kidney disease: Secondary | ICD-10-CM | POA: Diagnosis not present

## 2021-04-11 DIAGNOSIS — I69398 Other sequelae of cerebral infarction: Secondary | ICD-10-CM | POA: Diagnosis not present

## 2021-04-11 DIAGNOSIS — M1990 Unspecified osteoarthritis, unspecified site: Secondary | ICD-10-CM | POA: Diagnosis not present

## 2021-04-11 DIAGNOSIS — E538 Deficiency of other specified B group vitamins: Secondary | ICD-10-CM | POA: Diagnosis not present

## 2021-04-11 DIAGNOSIS — I4510 Unspecified right bundle-branch block: Secondary | ICD-10-CM | POA: Diagnosis not present

## 2021-04-13 ENCOUNTER — Encounter (INDEPENDENT_AMBULATORY_CARE_PROVIDER_SITE_OTHER): Payer: Self-pay | Admitting: Ophthalmology

## 2021-04-13 ENCOUNTER — Ambulatory Visit (INDEPENDENT_AMBULATORY_CARE_PROVIDER_SITE_OTHER): Payer: Medicare Other | Admitting: Ophthalmology

## 2021-04-13 DIAGNOSIS — H4042X0 Glaucoma secondary to eye inflammation, left eye, stage unspecified: Secondary | ICD-10-CM

## 2021-04-13 DIAGNOSIS — I2510 Atherosclerotic heart disease of native coronary artery without angina pectoris: Secondary | ICD-10-CM

## 2021-04-13 DIAGNOSIS — H209 Unspecified iridocyclitis: Secondary | ICD-10-CM

## 2021-04-13 DIAGNOSIS — T85398A Other mechanical complication of other ocular prosthetic devices, implants and grafts, initial encounter: Secondary | ICD-10-CM | POA: Insufficient documentation

## 2021-04-13 DIAGNOSIS — H4312 Vitreous hemorrhage, left eye: Secondary | ICD-10-CM

## 2021-04-13 MED ORDER — CYCLOPENTOLATE HCL 1 % OP SOLN: 1.0000 [drp] | Freq: Two times a day (BID) | OPHTHALMIC | 10 refills | Status: DC

## 2021-04-13 NOTE — Assessment & Plan Note (Signed)
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  Clear cortical material growing in a summering's ring fashion I have seen this create chafe in the past particularly in patients on Eliquis, apixaban Xarelto or similar medications ? 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

## 2021-04-13 NOTE — Patient Instructions (Signed)
New medication (red top) Cyclogyl 1% 1 drop left eye twice daily till follow-up in the office ?

## 2021-04-13 NOTE — Assessment & Plan Note (Signed)
OS, recurrent, now with careful examination there is intracapsular hemorrhage in the superonasal quadrant of the capsular bag.  In this region there is a summering's ring normal growth post cataract surgery that could be triggering iris chafe and/or there is a dehiscence in the peripheral capsular bag peripheral to the area with ciliary body and haptic chafe triggering recurrence of vitreous hemorrhage.  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

## 2021-04-13 NOTE — Progress Notes (Signed)
? ? ?04/13/2021 ? ?  ? ?CHIEF COMPLAINT ?Patient presents for  ?Chief Complaint  ?Patient presents with  ? Eye Problem  ? ? ? ? ?HISTORY OF PRESENT ILLNESS: ?Linda Oliver is a 88 y.o. female who presents to the clinic today for:  ? ?HPI   ?WIP- Vitreous Hemorrhage, OS, referred by Dr. Elliot Dally. ?Patients son states "she was noticing blurred vision, she said she had a "film" over her left eye, which is why she went to Dr. Patrici Ranks office. They said she has blood in the back of her eye so she scheduled an appointment with Dr Zadie Rhine." Pt states "I have had this off and on for 6 months or more." ?Last edited by Laurin Coder on 04/13/2021  9:26 AM.  ?  ? ? ?Referring physician: ?Clent Jacks, MD ?[address] ?[address] ?[place],  [place] [address] ? ?HISTORICAL INFORMATION:  ? ?Selected notes from the [facility] ?  ? ?Lab Results  ?Component Value Date  ? HGBA1C 8.7 (H) 08/23/2018  ?  ? ?CURRENT MEDICATIONS: ?Current Outpatient Medications (Ophthalmic Drugs)  ?Medication Sig  ? cyclopentolate (CYCLOGYL) 1 % ophthalmic solution Place 1 drop into the left eye 2 (two) times daily.  ? ?No current facility-administered medications for this visit. (Ophthalmic Drugs)  ? ?Current Outpatient Medications (Other)  ?Medication Sig  ? acetaminophen (TYLENOL) 500 MG tablet Take 500 mg by mouth every 4 (four) hours as needed for moderate pain.  ? apixaban (ELIQUIS) 5 MG TABS tablet Take 1 tablet (5 mg total) by mouth 2 (two) times daily. Will need to see MD for future refills.  ? B-D UF III MINI PEN NEEDLES 31G X 5 MM MISC SMARTSIG:1 Each SUB-Q Daily  ? busPIRone (BUSPAR) 7.5 MG tablet Take 7.5 mg by mouth 2 (two) times daily.  ? clopidogrel (PLAVIX) 75 MG tablet Take 1 tablet (75 mg total) by mouth daily.  ? DULoxetine (CYMBALTA) 60 MG capsule Take 60 mg by mouth daily.  ? ezetimibe (ZETIA) 10 MG tablet Take 10 mg by mouth daily.  ? Ferrous Sulfate Dried (HIGH POTENCY IRON) 65 MG TABS Take 1 tablet by mouth daily.  ? fesoterodine  (TOVIAZ) 8 MG TB24 tablet Take 8 mg by mouth every evening.   ? fluticasone (FLONASE) 50 MCG/ACT nasal spray Place 1 spray into both nostrils 2 (two) times daily as needed for allergies or rhinitis.  ? FREESTYLE LITE test strip   ? furosemide (LASIX) 20 MG tablet TAKE 1 TABLET DAILY  ? gabapentin (NEURONTIN) 300 MG capsule Take 300 mg by mouth 2 (two) times daily.  ? glimepiride (AMARYL) 1 MG tablet Take 1 mg by mouth daily.  ? hydrOXYzine (ATARAX/VISTARIL) 25 MG tablet Take 50 mg by mouth 3 (three) times daily.  ? Insulin Glargine (LANTUS SOLOSTAR) 100 UNIT/ML Solostar Pen Inject 37 Units into the skin daily before breakfast.  ? Iron-Vitamin C (IRON 100/C PO) Take by mouth.  ? isosorbide dinitrate (ISORDIL) 30 MG tablet TAKE 1 TABLET THREE TIMES A DAY  ? Loratadine 10 MG CAPS Take by mouth.  ? metoprolol succinate (TOPROL-XL) 25 MG 24 hr tablet Take 12.5 mg by mouth 2 (two) times daily.  ? NAMZARIC 28-10 MG CP24 TAKE 1 CAPSULE DAILY  ? nitroGLYCERIN (NITROSTAT) 0.4 MG SL tablet Place 1 tablet (0.4 mg total) under the tongue every 5 (five) minutes as needed for up to 25 days for chest pain. (Patient taking differently: Place 0.4 mg under the tongue every  5 (five) minutes as needed for chest pain.)  ? pantoprazole (PROTONIX) 40 MG tablet Take 40 mg by mouth every evening.   ? potassium chloride SA (KLOR-CON M) 20 MEQ tablet TAKE 1 TABLET DAILY  ? rosuvastatin (CRESTOR) 10 MG tablet Take 10 mg by mouth daily before supper.  ? sitaGLIPtin (JANUVIA) 100 MG tablet Take 100 mg by mouth daily.  ? Vibegron (GEMTESA) 75 MG TABS Take by mouth.  ? vitamin B-12 (CYANOCOBALAMIN) 1000 MCG tablet Take 1,000 mcg by mouth daily.  ? ?No current facility-administered medications for this visit. (Other)  ? ? ? ? ?REVIEW OF SYSTEMS: ?ROS   ?Negative for: Constitutional, Gastrointestinal, Neurological, Skin, Genitourinary, Musculoskeletal, HENT, Endocrine, Cardiovascular, Eyes, Respiratory, Psychiatric, Allergic/Imm, Heme/Lymph ?Last  edited by Hurman Horn, MD on 04/13/2021 10:26 AM.  ?  ? ? ? ?ALLERGIES ?Allergies  ?Allergen Reactions  ? Codeine   ?  vomiting  ? Metformin   ?  Other reaction(s): nausea, vomitting and diarrhea  ? Oxycodone-Acetaminophen   ?  Other reaction(s): Unknown  ? Percocet [Oxycodone-Acetaminophen] Nausea And Vomiting  ? Tape   ?  Other reaction(s): Unknown  ? Tolterodine Other (See Comments)  ? Latex Itching and Rash  ?  EKG leads  ? ? ?PAST MEDICAL HISTORY ?Past Medical History:  ?Diagnosis Date  ? Anginal pain ([place])   ? occ  ? Anxiety   ? Broken ankle   ? Bullous pemphigus   ? CAD (coronary artery disease)   ? [place]   80% stenosis ostial Left main, 30% stenosis proximal LAD, 80% stenosis proximal RCA CABG  -  LIMA to LAD, SVG to int, SVG to [place], [place]   ? Depression   ? Diabetic peripheral neuropathy ([place])   ? Encounter for loop recorder check 04/11/2019  ? GERD (gastroesophageal reflux disease)   ? H/O: stroke: Left occipital stroke and vision loss 08/24/2018  ? Headache(784.0)   ? hx migraines  ? Heart murmur   ? mvp  ? Hyperlipidemia   ? Hypertension   ? Hypertensive heart disease   ? Loop recorder Reveal 08/24/2018 04/11/2019  ? Scheduled Remote loop recorder check 03/10/2019: The presenting rhythm is sinus bradycardia. There were 0 symptomatic patient activations recorded. There were 275 AF episodes and 0 AT episodes recorded, representing a 4.2 % cumulative AT/AF burden. The longest episode lasted 00:03:40:00 in duration. Some false with PAC. There were 0 tachy episodes detected. There were 0 pause episodes detected. Th  ? Nasal bleeding   ? occ due to O2  ? OA (osteoarthritis)   ? Obesity (BMI 30-39.9)   ? Obstructive sleep apnea on CPAP   ? Osteopenia   ? Paroxysmal atrial fibrillation ([facility]) 04/17/2019  ? Peripheral neuropathy   ? Seizure [facility])   ? 2009  ? Sleep apnea   ? dx 13yr ago doesnt use cpap but uses O2 at nite 2L  ? Stones in the urinary tract   ? Type 2 diabetes mellitus with  vascular disease ([place]   ? UTI (lower urinary tract infection)   ? ?Past Surgical History:  ?Procedure Laterality Date  ? ABDOMINAL HYSTERECTOMY  70's  ? CARDIAC CATHETERIZATION    ? CARPAL TUNNEL RELEASE Bilateral   ? CATARACT EXTRACTION W/PHACO Bilateral   ? CHOLECYSTECTOMY    ? CORONARY ARTERY BYPASS GRAFT  96  ? LOOP RECORDER INSERTION N/A 08/24/2018  ? Procedure: LOOP RECORDER INSERTION;  Surgeon: CConstance Haw MD;  Location: [facility];  Service: Cardiovascular;  Laterality: N/A;  ? SHOULDER ARTHROSCOPY    ? rt  ? SHOULDER ARTHROSCOPY WITH ROTATOR CUFF REPAIR AND SUBACROMIAL DECOMPRESSION  12/01/2011  ? Procedure: SHOULDER ARTHROSCOPY WITH ROTATOR CUFF REPAIR AND SUBACROMIAL DECOMPRESSION;  Surgeon: Nita Sells, MD;  Location: [facility];  Service: Orthopedics;  Laterality: Left;  shoulder debridement calcific tendonitis  ? ? ?FAMILY HISTORY ?Family History  ?Problem Relation Age of Onset  ? Coronary artery disease Mother   ? Heart disease Father   ? Cancer Father   ?     skin  ? Cancer Brother   ? Cancer - Colon Brother   ?     brain, Lymphoma  ? ? ?SOCIAL HISTORY ?Social History  ? ?Tobacco Use  ? Smoking status: Former  ?  Packs/day: 1.00  ?  Years: 30.00  ?  Pack years: 30.00  ?  Types: Cigarettes  ?  Quit date: 11/25/1986  ?  Years since quitting: 34.4  ? Smokeless tobacco: Never  ?Vaping Use  ? Vaping Use: Never used  ?Substance Use Topics  ? Alcohol use: No  ? Drug use: No  ? ?  ? ?  ? ?OPHTHALMIC EXAM: ? ?Base Eye Exam   ? ? Visual Acuity (ETDRS)   ? ?   Right Left  ? Dist cc 20/25 20/80 -2  ? Dist ph cc  20/60 -2  ? ? Correction: Glasses  ?Pt reports "they are jumping around, I can see two letters then they drop off the screen." ? ?  ?  ? ? Tonometry (Tonopen, 9:33 AM)   ? ?   Right Left  ? Pressure 12 8  ? ?  ?  ? ? Pupils   ? ?   Pupils Dark Light React APD  ? Right PERRL 3 3 Minimal None  ? Left PERRL 4 4 Minimal None  ? ?  ?  ? ? Visual Fields (Counting fingers)   ? ?   Left  Right  ?  Full Full  ? ?  ?  ? ? Extraocular Movement   ? ?   Right Left  ?  Full Full  ? ?  ?  ? ? Neuro/Psych   ? ? Oriented x3: Yes  ? Mood/Affect: Normal  ? ?  ?  ? ? Dilation   ? ? Both eyes: 1.0% Mydriacyl

## 2021-04-28 ENCOUNTER — Ambulatory Visit: Payer: Medicare Other | Admitting: Neurology

## 2021-05-01 DIAGNOSIS — E1122 Type 2 diabetes mellitus with diabetic chronic kidney disease: Secondary | ICD-10-CM | POA: Diagnosis not present

## 2021-05-01 DIAGNOSIS — E1142 Type 2 diabetes mellitus with diabetic polyneuropathy: Secondary | ICD-10-CM | POA: Diagnosis not present

## 2021-05-01 DIAGNOSIS — I129 Hypertensive chronic kidney disease with stage 1 through stage 4 chronic kidney disease, or unspecified chronic kidney disease: Secondary | ICD-10-CM | POA: Diagnosis not present

## 2021-05-01 DIAGNOSIS — I480 Paroxysmal atrial fibrillation: Secondary | ICD-10-CM | POA: Diagnosis not present

## 2021-05-01 DIAGNOSIS — D631 Anemia in chronic kidney disease: Secondary | ICD-10-CM | POA: Diagnosis not present

## 2021-05-01 DIAGNOSIS — N1831 Chronic kidney disease, stage 3a: Secondary | ICD-10-CM | POA: Diagnosis not present

## 2021-05-03 DIAGNOSIS — I129 Hypertensive chronic kidney disease with stage 1 through stage 4 chronic kidney disease, or unspecified chronic kidney disease: Secondary | ICD-10-CM | POA: Diagnosis not present

## 2021-05-03 DIAGNOSIS — E1122 Type 2 diabetes mellitus with diabetic chronic kidney disease: Secondary | ICD-10-CM | POA: Diagnosis not present

## 2021-05-03 DIAGNOSIS — E1142 Type 2 diabetes mellitus with diabetic polyneuropathy: Secondary | ICD-10-CM | POA: Diagnosis not present

## 2021-05-03 DIAGNOSIS — D631 Anemia in chronic kidney disease: Secondary | ICD-10-CM | POA: Diagnosis not present

## 2021-05-03 DIAGNOSIS — I480 Paroxysmal atrial fibrillation: Secondary | ICD-10-CM | POA: Diagnosis not present

## 2021-05-03 DIAGNOSIS — N1831 Chronic kidney disease, stage 3a: Secondary | ICD-10-CM | POA: Diagnosis not present

## 2021-05-05 ENCOUNTER — Ambulatory Visit (INDEPENDENT_AMBULATORY_CARE_PROVIDER_SITE_OTHER): Payer: Medicare Other | Admitting: Neurology

## 2021-05-05 DIAGNOSIS — I480 Paroxysmal atrial fibrillation: Secondary | ICD-10-CM | POA: Diagnosis not present

## 2021-05-05 DIAGNOSIS — I2510 Atherosclerotic heart disease of native coronary artery without angina pectoris: Secondary | ICD-10-CM

## 2021-05-05 DIAGNOSIS — E1122 Type 2 diabetes mellitus with diabetic chronic kidney disease: Secondary | ICD-10-CM | POA: Diagnosis not present

## 2021-05-05 DIAGNOSIS — N1831 Chronic kidney disease, stage 3a: Secondary | ICD-10-CM | POA: Diagnosis not present

## 2021-05-05 DIAGNOSIS — I129 Hypertensive chronic kidney disease with stage 1 through stage 4 chronic kidney disease, or unspecified chronic kidney disease: Secondary | ICD-10-CM | POA: Diagnosis not present

## 2021-05-05 DIAGNOSIS — E1142 Type 2 diabetes mellitus with diabetic polyneuropathy: Secondary | ICD-10-CM | POA: Diagnosis not present

## 2021-05-05 DIAGNOSIS — G4719 Other hypersomnia: Secondary | ICD-10-CM

## 2021-05-05 DIAGNOSIS — G4733 Obstructive sleep apnea (adult) (pediatric): Secondary | ICD-10-CM | POA: Diagnosis not present

## 2021-05-05 DIAGNOSIS — D631 Anemia in chronic kidney disease: Secondary | ICD-10-CM | POA: Diagnosis not present

## 2021-05-05 DIAGNOSIS — I5033 Acute on chronic diastolic (congestive) heart failure: Secondary | ICD-10-CM

## 2021-05-06 NOTE — Progress Notes (Signed)
? ? ?  ?  ?[facility] ?  ?HOME SLEEP TEST REPORT ( by Watch PAT)   ?STUDY DATE:  05-06-2021 ?  ?ORDERING CLINICIAN: Larey Seat, MD  ?REFERRING CLINICIAN: Adrian Prows, MD and Primary neurologist Linda Oliver. ?  ?CLINICAL INFORMATION/HISTORY: Linda Oliver is a 88 y.o. female patient with CAD, chest pain and  ?Cognitive impairment., followed for MCI/ dementia by Linda.Ahern. She has also been seen by Linda. Leonie Man.  ?Seen here as in a referral  from Linda. Einar Gip for a sleep consultation as an established CPAP patient. Loop recorder was implanted in 2020. ?CPAP feels as if not enough power is provided. Nasal CPAP mask.  ?She was last seen by Linda Einar Gip in January- but meanwhile, at the end of February, developed chest pain while she was visiting family in [place]- was [facility]- and she underwent cardiac intervention: angioplasty and 2 stents were placed. She has been known to have atrial fibrillation, on Eloquis anticoagulation.  ?This all happened just over the last 6 weeks- here to follow up on sleep.  ?She is due for a new CPAP, has been using hers and did well so far. She wants to only have female attending should she have to come into the lab. Prefers HST. CPAP patient - Was on 8 cm water with 2 L of 02.  ?  ?Epworth sleepiness score: 9/24. ?  ?BMI:33.8 kg/m? ?  ?Neck Circumference: 14" ?  ?FINDINGS: ?  ?Sleep Summary: ?  ?Total Recording Time (hours, min): The total recording time amounted to 8 hours and 49 minutes of which 7 hours and 22 minutes was a total calculated sleep time.      ?Percent REM (%):   Of the sleep time, 15.2% were REM sleep.                                   ?  ?Respiratory Indices: ?  ?Calculated pAHI (per hour):    Total AHI was 28.1/h and much higher in REM sleep.  REM sleep was associated with an AHI of 48.1/h and non-REM sleep with an AHI of 24.5/h.                                             ?  ?Positional AHI:   Sleep in supine position was associated with an AHI of 34.3/h.   The patient slept 245 minutes in supine position.  This was followed by 182 minutes on the left side with an AHI of 19.8 and only 15 minutes sleep on her right side with an AHI of 28. ? ?Snoring level reached a mean volume of 42 dB and was present for 36% of total sleep time.                                              ?  ?Oxygen Saturation Statistics: ?  ?O2 Saturation Range (%): Range between a nadir at 79% and a maximum saturation at 95%.  The mean oxygen saturation was 90%.                                    ?  ?  O2 Saturation (minutes) <89%:   15.3 minutes      ?  ?Pulse Rate Statistics: ?  ?Pulse Mean (bpm): 71 bpm             ?  ?Pulse Range: Between 53 and 91 bpm, please note that this home sleep test can only give cardiac rate but not rhythm data.             ?  ?IMPRESSION:  This HST confirms the presence of moderate severe apnea that exacerbates by 100% during REM sleep.  Mild to moderate hypoxemia is associated. ? ?RECOMMENDATION: This constellation of sleep apnea needs to continue treatment with CPAP.  Her new device will be an autotitration device set between 5 and 14 cmH2O with 2 cm EPR.  The patient had in the past used oxygen at 2 L. ?If oxygen needs to be reordered for this patient it would not be based on my sleep study.  ? I see no reason to supplement oxygen based on only 15.3 minutes of total desaturation time. ? ?  ?INTERPRETING PHYSICIAN: ? ? Larey Seat, MD  ? ?Medical Director of [facility] [employer].  ? ? ? ? ? ? ? ? ? ? ? ? ? ? ? ? ? ? ? ? ? ?

## 2021-05-14 DIAGNOSIS — Z20822 Contact with and (suspected) exposure to covid-19: Secondary | ICD-10-CM | POA: Diagnosis not present

## 2021-05-17 DIAGNOSIS — G4733 Obstructive sleep apnea (adult) (pediatric): Secondary | ICD-10-CM | POA: Insufficient documentation

## 2021-05-17 DIAGNOSIS — G4719 Other hypersomnia: Secondary | ICD-10-CM | POA: Insufficient documentation

## 2021-05-17 NOTE — Procedures (Signed)
[facility] ?  ?HOME SLEEP TEST REPORT ( by Watch PAT)   ?STUDY DATE:  05-06-2021 ?  ?ORDERING CLINICIAN: Larey Seat, MD  ?REFERRING CLINICIAN: Adrian Prows, MD and Primary neurologist Dr Jaynee Eagles. ?  ?CLINICAL INFORMATION/HISTORY: Linda Oliver is a 88 y.o. female patient with CAD, chest pain and  ?Cognitive impairment., followed for MCI/ dementia by Dr.Ahern. She has also been seen by Dr. Leonie Man.  ?Seen here as in a referral  from Dr. Einar Gip for a sleep consultation as an established CPAP patient. Loop recorder was implanted in 2020. ?CPAP feels as if not enough power is provided. Nasal CPAP mask.  ?She was last seen by Dr Einar Gip in January- but meanwhile, at the end of February, developed chest pain while she was visiting family in [place]- was brought [facility]- and she underwent cardiac intervention: angioplasty and 2 stents were placed. She has been known to have atrial fibrillation, on Eloquis anticoagulation.  ?This all happened just over the last 6 weeks- here to follow up on sleep.  ?She is due for a new CPAP, has been using hers and did well so far. She wants to only have female attending should she have to come into the lab. Prefers HST. CPAP patient - Was on 8 cm water with 2 L of 02.  ?  ?Epworth sleepiness score: 9/24. ?  ?BMI:33.8 kg/m? ?  ?Neck Circumference: 14" ?  ?FINDINGS: ?  ?Sleep Summary: ?  ?Total Recording Time (hours, min): The total recording time amounted to 8 hours and 49 minutes of which 7 hours and 22 minutes was a total calculated sleep time.      ?Percent REM (%):   Of the sleep time, 15.2% were REM sleep.                                   ?  ?Respiratory Indices: ?  ?Calculated pAHI (per hour):    Total AHI was 28.1/h and much higher in REM sleep.  REM sleep was associated with an AHI of 48.1/h and non-REM sleep with an AHI of 24.5/h.                                             ?  ?Positional AHI:   Sleep in supine position was associated with an AHI of 34.3/h.  The patient  slept 245 minutes in supine position.  This was followed by 182 minutes on the left side with an AHI of 19.8 and only 15 minutes sleep on her right side with an AHI of 28. ? ?Snoring level reached a mean volume of 42 dB and was present for 36% of total sleep time.                                              ?  ?Oxygen Saturation Statistics: ?  ?O2 Saturation Range (%): Range between a nadir at 79% and a maximum saturation at 95%.  The mean oxygen saturation was 90%.                                    ?  ?  O2 Saturation (minutes) <89%:   15.3 minutes      ?  ?Pulse Rate Statistics: ?  ?Pulse Mean (bpm): 71 bpm             ?  ?Pulse Range: Between 53 and 91 bpm, please note that this home sleep test can only give cardiac rate but not rhythm data.             ?  ?IMPRESSION:  This HST confirms the presence of moderate severe apnea that exacerbates by 100% during REM sleep.  Mild to moderate hypoxemia is associated. ? ?RECOMMENDATION: This constellation of sleep apnea needs to continue treatment with CPAP.  Her new device will be an autotitration device set between 5 and 14 cmH2O with 2 cm EPR.  The patient had in the past used oxygen at 2 L. ?If oxygen needs to be reordered for this patient it would not be based on my sleep study.  ? I see no reason to supplement oxygen based on only 15.3 minutes of total desaturation time. ? ?  ?INTERPRETING PHYSICIAN: ? ? Larey Seat, MD  ? ?Medical Director of [facility] [employer].  ? ? ? ? ? ? ? ? ? ? ? ? ? ? ? ? ? ? ? ? ? ?

## 2021-05-17 NOTE — Addendum Note (Signed)
Addended by: Larey Seat on: 05/17/2021 06:51 PM ? ? Modules accepted: Orders ? ?

## 2021-05-18 ENCOUNTER — Telehealth: Payer: Self-pay | Admitting: Neurology

## 2021-05-18 ENCOUNTER — Encounter (INDEPENDENT_AMBULATORY_CARE_PROVIDER_SITE_OTHER): Payer: Medicare Other | Admitting: Ophthalmology

## 2021-05-18 NOTE — Telephone Encounter (Deleted)
I called pt's daughter. I advised pt that Dr. Brett Fairy reviewed their sleep study results and found that pt has moderate to severe OSA. Dr. Brett Fairy recommends that pt continues auto cpap. I reviewed PAP compliance expectations with the pt. Pt's daughter is agreeable to pt continuing CPAP. I advised pt that an order will be sent to a DME, [facility], and [facility] will call the pt within about one week after they file with the pt's insurance. [facility] ? will show the pt how to use the machine, fit for masks, and troubleshoot the CPAP if needed. Pt current machine appears was set up 08/23/2017. She would not be eligible until Aug 2024 for a new machine and due to medicare purposes would need to make sure a follow up apt takes place documenting compliance within 6 months prior to the new set up date.  ? A letter with all of this information in it will be sent through my chart to the pt as a reminder.  Pt's daughter verbalized understanding of results. Pt had no questions at this time but was encouraged to call back if questions arise. I have sent the order to [facility] and have received confirmation that they have received the order. ? ?

## 2021-05-18 NOTE — Telephone Encounter (Signed)
-----   Message from Larey Seat, MD sent at 05/17/2021  6:51 PM EDT ----- ?IMPRESSION:  This HST confirms the presence of moderate severe apnea that exacerbates by 100% during REM sleep.  Mild to moderate hypoxemia is associated. ?? ?RECOMMENDATION: This constellation of sleep apnea needs to continue treatment with CPAP.  Her new device will be an autotitration device set between 5 and 14 cmH2O with 2 cm EPR.  The patient had in the past used oxygen at 2 L. ?If oxygen needs to be reordered for this patient it would not be based on my sleep study.  ? I see no reason to supplement oxygen based on only 15.3 minutes of total desaturation time. ?? ?? ?

## 2021-05-18 NOTE — Telephone Encounter (Signed)
I called pt's daughter. I advised pt that Dr. Brett Fairy reviewed their sleep study results and found that pt has moderate to severe OSA. Dr. Brett Fairy recommends that pt continues auto cpap. I reviewed PAP compliance expectations with the pt. Pt's daughter is agreeable to pt continuing CPAP. I advised pt that an order will be sent to a DME, [facility], and [facility] will call the pt within about one week after they file with the pt's insurance. [facility] ? will show the pt how to use the machine, fit for masks, and troubleshoot the CPAP if needed. Pt current machine appears was set up 08/23/2017. She would not be eligible until Aug 2024 for a new machine and due to medicare purposes would need to make sure a follow up apt takes place documenting compliance within 6 months prior to the new set up date.  ? A letter with all of this information in it will be sent through my chart to the pt as a reminder.  Pt's daughter verbalized understanding of results. Pt had no questions at this time but was encouraged to call back if questions arise. I have sent the order to [facility] and have received confirmation that they have received the order. ?

## 2021-06-08 ENCOUNTER — Encounter: Payer: Self-pay | Admitting: *Deleted

## 2021-06-09 ENCOUNTER — Other Ambulatory Visit: Payer: Self-pay | Admitting: Neurology

## 2021-06-09 ENCOUNTER — Ambulatory Visit (INDEPENDENT_AMBULATORY_CARE_PROVIDER_SITE_OTHER): Payer: Medicare Other | Admitting: Neurology

## 2021-06-09 ENCOUNTER — Encounter: Payer: Self-pay | Admitting: Neurology

## 2021-06-09 VITALS — BP 157/64 | HR 75 | Ht 60.0 in | Wt 165.0 lb

## 2021-06-09 DIAGNOSIS — I2510 Atherosclerotic heart disease of native coronary artery without angina pectoris: Secondary | ICD-10-CM

## 2021-06-09 DIAGNOSIS — G3184 Mild cognitive impairment, so stated: Secondary | ICD-10-CM | POA: Diagnosis not present

## 2021-06-09 DIAGNOSIS — G4733 Obstructive sleep apnea (adult) (pediatric): Secondary | ICD-10-CM

## 2021-06-09 MED ORDER — NAMZARIC 28-10 MG PO CP24: 1.0000 | ORAL_CAPSULE | Freq: Every day | ORAL | 4 refills | Status: AC

## 2021-06-09 NOTE — Progress Notes (Signed)
[facility]   Provider:  Dr Jaynee Eagles Referring Provider: Shon Baton, MD Primary Care Physician:  Precious Reel, MD CC:  Memory loss  Jun 09, 2021: This is a patient well known to [place], we have seen her in the past for sleep apnea (on cpap) and for memory concerns. Last I saw her was in 2019 but she has been seen by our sleep team regularly for her cpap management(03/25/2021, 01/01/2020, 01/01/2019) and also in the past 09/25/2018 for stroke. PMHx DM and peripheral neuropathy, frequent falls using a walker, UTIs confusion, former smoker quit in 1996, chronic kidney disease stage III, chronic diastolic congestive heart failure, lower extremity edema, acquired thrombophilia, A-fib, on Lasix for lower extremity edema, migraine headaches, HTN, HLD, OSA, autonomic orthostatic hypotension, asymptomatic carotid stenosis, prior seizure and CAD with CABG, medial left occipital infarct 08/22/2018(CTA head/neck showed left subclavian high-grade stenosis with irregular plaque/mural thrombus aortic isthmus and left subclavian origin, left PCA branch occlusion and left ICA 50% stenosis and cervical bulb.  Lower extremity venous Dopplers negative for DVT.  2D echo normal EF without cardiac source of embolus or PFO.  Loop recorder placed to rule out atrial fibrillation.  LDL 28.  A1c 8.7.  Recommended DAPT for 3 months then Plavix alone) with residual deficits of mild vision loss and dizziness which was present prior to her stroke, loop recorder did show evidence of A-fib therefore Eliquis 5 mg twice daily was initiated in September 2020 with discontinuation of aspirin and Plavix.  She continued Namzaric at that time for underlying dementia.  She has a history of dementia, reviewed notes from [employer], her memory and tremor are worsening, she lives with daughter Freda Munro, she is no longer driving, had a fall out of bed but no significant injuries or issues, as far as her cognitive decline goes  her memory is stable for her, hallucinations are better, stress is a factor and may also have been related to prior CVA, on Namzaric, last MRI was August 2020, reviewed labs last hemoglobin A1c 7.8% but has been higher in the past upwards of close to 10, on insulin, on gabapentin for diabetic peripheral neuropathy.  Labs collected June 2022 included CMP which showed a glucose of 185 and alk phos of 140 otherwise normal, CBC with anemia hemoglobin 11.5 and hematocrit 36.3 otherwise unremarkable, per PCP notes she is followed for B vitamin deficiencies, she is on duloxetine for major depression off of the Elavil, also has anxiety, obesity, obstructive sleep apnea, restless leg syndrome, arthritis and for urticaria she takes hydroxyzine at night.  Looking through my notes, she was placed on Aricept and Namenda in the past as far back as 2015 when she saw my colleague Dr. Janann Colonel in the office before she was transferred into my care.  Has been helping her with her finances since prior to 2015. Feels like hermemory has been stable. She now lives with daughter and husband. No driving. No falls. She has some difficulty swallowing cometimes, she gets choked a lot, maybe mention to Dr. Virgina Jock to see if a swallowing study, she has acide relux and he added a medication, appetite is good, she uses a cane/walker but no recent falls, she just had in home PT and she is doing exercises. She still has some vision problems and she has seen Dr. Radene Ou, it comes and goes. Still having vision problems in the left eye, she also see drs Groat, no bleeding with the eliquis, she has done well. Lasix helps  with the weeping legs. Still exercing, still likes to be social, she has a gentleman friend online a younger man and she enjoy his company, she will spend a week or two with him at times. She is very happy.    08/22/2018: MRI brain : 1. Acute/subacute nonhemorrhagic infarct involving the medial left occipital lobe corresponds with the  patient's recent visual changes.2. Moderate atrophy and white matter disease is otherwise stable.    Patient complains of symptoms per HPI as well as the following symptoms: memory loss . Pertinent negatives and positives per HPI. All others negative   Interval history 11/16/2017: Patient is here for follow up,  She was diagnosed with sleep apnea. She is doing well with her cpap. She feels her memory is stable. Report shows compliance. She is using it.  She is feeling less tired. Stable. Formal memory testing recommended for this patient who has been c/o memory impairment for years however her memory remains stable as do her testing MMSEs.   MRI brain 09/27/2016: nothing acute, remote small infarct superior left cerebellar hemisphere, moderate cerebral atrophy and mild cerebellar atrophy.   Interval history 11/08/2016: Feels her memory is stable.Discussed MCI, formal neurocognitive testing. Discussed clinical trials including [employer]. She is already on Aricept and Namenda. Feels her memory is stable. Here with her son-in-law who also provides information. Provided information all of the above. I also reviewed MRI of the brain and cervical spine. 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 She had some degenerative changes in the neck but no myelopathy or cervical stenosis. EMG nerve conduction study showed peripheral neuropathy, bilateral carpal tunnel syndrome but no etiology for her proximal weakness such as myopathy myositis or neuromuscular disorder.   Interval history 09/21/2016: She is here with weakness in the arms. She was taken off of crestor with no release. She has an aching in her arms, can be a 10/10. Started 3 months ago when she started using the piano. She wuite playing the piano. She has neck problems and a stiff neck. More in the forearm and  proximal arm. No difficulty lifting arms overhead, doesn't get tired easily brushing hair, can't lift the iron skillet. No problem with the legs. Symptoms are symmetric and she has tingling in the fingers. Worse during the day. No ptosis, diplopia, no new swallowing problems or new shortness or breath.    Interval history 11/04/2015: She goes to [place] every weekend to visit her husband in the [place]. 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 They went to [facility] near [place] for a few days. Here with daughter who also provides information. It is dog friendly there and they like it their. Holidays at her daughter's house Freda Munro). Feels memory is stable. She is on Namzaric for memory. Cymbalta was increased for anxiety. She was "jumping out of her skin" which has helped with her anxiety. She takes b12 orally every day.     Interval History 10/06/2014: She is doing great. The medication really works for her. She takes oral B12 daily. No side effects from the Namzaric. Feels her memory is stable, no progression. No behavioral symptoms. Some mild anxiety but nothing significant. She is still driving and goes to therapy. She still has some dizziness and is in therapy. She has a recumbent bike that she uses. She is on gabapentin and cymbalta for neuropathy. She is here  with her daughter.   Interval update 02/19/2014:  MYKAYLA BRINTON is a 88 y.o. female here as a follow up. She is a former patient of Dr. Hosie Poisson. Has history of DM, HLD, HTN. Has neuropathy for which she takes Cymbalta and Gabapentin. Has occasional episodes of dizziness which she takes meclizine for. No known family history of cognitive decline. Has been told her B12 is low and has received injections in the past, currently taking oral B12 daily. She feels great, feels Aricept is making a significant difference. No side effects from the medication. She has decreased her Neurontin since starting Cymbalta and would like to try and decrease  further.    10/09/2013 visit:  ISABELLY KOBLER is a 88 y.o. female here as a follow up from Dr. Timothy Lasso for cognitive decline. Initial visit was 03/2013 at which time she had an unremarkable brain MRI and lab workup. She was started on Aricept 5mg  daily. Returns today with continued concerns over short term memory. Patient notes continued difficulty with getting lost, has a hard time getting around. She will have good and bad days, some days she is more confused and disoriented. Has gotten lost driving a few times. Her daughter notes some improvement since being on the medication. She feels that the patient is more alert and interactive. Notes some gait instability and dizziness but no falls.    Initial visit 04/09/2013: Daughter notes symptoms have been ongoing for a few years and have gotten progressively worse. Daughter notices she will be telling a story and then just forget what comes next. Trouble with short term memory, difficulty recalling what they just talked about. Currently driving, denies any difficulty, no accidents. Has noted some episodes of getting lost. Wakes up frequently overnight with need to urinate. She notes one episode of a visual hallucination, described as a hazy shadow. Notes some difficulty with remote memory but overall feels that is still intact.    She currently lives alone, her daughter takes care of finances and medications, has been doing this for the past few years. Has been hospitalized a few times in the past with question of stroke, no stroke noted and daughter suspects patients may have been managing her medications incorrectly in the past.          Review of Systems: Patient complains of symptoms per HPI as well as the following symptoms: no CP, no SOB. Pertinent negatives and positives per HPI. All others negative.   Social History   Socioeconomic History   Marital status: Widowed    Spouse name: Not on file   Number of children: 1   Years of education: 56    Highest education level: Not on file  Occupational History   Not on file  Tobacco Use   Smoking status: Former    Packs/day: 1.00    Years: 30.00    Pack years: 30.00    Types: Cigarettes    Quit date: 11/25/1986    Years since quitting: 34.5   Smokeless tobacco: Never  Vaping Use   Vaping Use: Never used  Substance and Sexual Activity   Alcohol use: No   Drug use: No   Sexual activity: Yes    Birth control/protection: Post-menopausal  Other Topics Concern   Not on file  Social History Narrative   Married, has 1 child   Caffeine use: none      Update 06/09/2021   Lives with daughter   Caffeine: 2 cups/day of diet coke  Social Determinants of Health   Financial Resource Strain: Not on file  Food Insecurity: Not on file  Transportation Needs: Not on file  Physical Activity: Not on file  Stress: Not on file  Social Connections: Not on file  Intimate Partner Violence: Not on file    Family History  Problem Relation Age of Onset   Coronary artery disease Mother    Heart disease Father    Cancer Father        skin   Coronary artery disease Father    Cancer Brother    Cancer - Colon Brother        brain, Lymphoma    Past Medical History:  Diagnosis Date   Anginal pain ([place])    occ   Anxiety    Broken ankle    Bullous pemphigus    CAD (coronary artery disease)    [place]   80% stenosis ostial Left main, 30% stenosis proximal LAD, 80% stenosis proximal RCA CABG  -  LIMA to LAD, SVG to int, SVG to [place], [place]    Depression    Diabetic peripheral neuropathy ([place])    Encounter for loop recorder check 04/11/2019   GERD (gastroesophageal reflux disease)    H/O: stroke: Left occipital stroke and vision loss 08/24/2018   Headache(784.0)    hx migraines   Heart murmur    mvp   Hyperlipidemia    Hypertension    Hypertensive heart disease    Loop recorder Reveal 08/24/2018 04/11/2019   Scheduled Remote loop recorder check 03/10/2019: The  presenting rhythm is sinus bradycardia. There were 0 symptomatic patient activations recorded. There were 275 AF episodes and 0 AT episodes recorded, representing a 4.2 % cumulative AT/AF burden. The longest episode lasted 00:03:40:00 in duration. Some false with PAC. There were 0 tachy episodes detected. There were 0 pause episodes detected. Th   Nasal bleeding    occ due to O2   OA (osteoarthritis)    Obesity (BMI 30-39.9)    Obstructive sleep apnea on CPAP    Osteopenia    Paroxysmal atrial fibrillation ([place]) 04/17/2019   Peripheral neuropathy    Seizure ([place])    2009   Sleep apnea    dx 8yrs ago doesnt use cpap but uses O2 at nite 2L   Stones in the urinary tract    Type 2 diabetes mellitus with vascular disease ([place])    UTI (lower urinary tract infection)     Patient Active Problem List   Diagnosis Date Noted   MCI (mild cognitive impairment) 06/09/2021   Excessive daytime sleepiness 05/17/2021   OSA on CPAP 05/17/2021   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/13/2021   Acute on chronic diastolic heart failure ([place]) 01/14/2021   Amaurosis fugax of left eye 04/27/2020   History of vitrectomy 03/03/2020   Right epiretinal membrane 03/03/2020   Posterior vitreous detachment of right eye 08/06/2019   Paroxysmal atrial fibrillation ([place]) 04/17/2019   Vitreous hemorrhage of left eye ([place]) 04/15/2019   Encounter for loop recorder check 04/11/2019   Loop recorder Medtronic Reveal LINQ  08/24/2018 04/11/2019   H/O: stroke: Left occipital stroke and vision loss 08/24/2018   Acute cerebrovascular accident (CVA) ([place]) 08/22/2018   Autonomic orthostatic hypotension 02/15/2018   Carotid stenosis, asymptomatic, bilateral 02/15/2018   Subjective memory complaints 11/19/2017   Hereditary and idiopathic peripheral neuropathy 02/19/2014   Coronary artery disease without angina pectoris    Type 2 diabetes mellitus with  vascular disease ([place])    Hyperlipidemia    Hypertensive heart  disease    Obesity (BMI 30-39.9)    GERD (gastroesophageal reflux disease)    Bullous pemphigus    Diabetic peripheral neuropathy ([facility])    H/O three vessel coronary artery bypass 03/30/1994    Past Surgical History:  Procedure Laterality Date   ABDOMINAL HYSTERECTOMY  70's   CARDIAC CATHETERIZATION     CARPAL TUNNEL RELEASE Bilateral    CATARACT EXTRACTION W/PHACO Bilateral    CHOLECYSTECTOMY     CORONARY ANGIOPLASTY WITH STENT PLACEMENT  2023   CORONARY ARTERY BYPASS GRAFT  1996   LOOP RECORDER INSERTION N/A 08/24/2018   Procedure: LOOP RECORDER INSERTION;  Surgeon: Constance Haw, MD;  Location: [facility];  Service: Cardiovascular;  Laterality: N/A;   SHOULDER ARTHROSCOPY     rt   SHOULDER ARTHROSCOPY WITH ROTATOR CUFF REPAIR AND SUBACROMIAL DECOMPRESSION  12/01/2011   Procedure: SHOULDER ARTHROSCOPY WITH ROTATOR CUFF REPAIR AND SUBACROMIAL DECOMPRESSION;  Surgeon: Nita Sells, MD;  Location: [facility];  Service: Orthopedics;  Laterality: Left;  shoulder debridement calcific tendonitis    Current Outpatient Medications  Medication Sig Dispense Refill   acetaminophen (TYLENOL) 500 MG tablet Take 500 mg by mouth every 4 (four) hours as needed for moderate pain.     apixaban (ELIQUIS) 5 MG TABS tablet Take 1 tablet (5 mg total) by mouth 2 (two) times daily. Will need to see MD for future refills. 180 tablet 1   B-D UF III MINI PEN NEEDLES 31G X 5 MM MISC SMARTSIG:1 Each SUB-Q Daily     busPIRone (BUSPAR) 7.5 MG tablet Take 7.5 mg by mouth 2 (two) times daily.     clopidogrel (PLAVIX) 75 MG tablet Take 1 tablet (75 mg total) by mouth daily. 90 tablet 3   cyclopentolate (CYCLOGYL) 1 % ophthalmic solution Place 1 drop into the left eye 2 (two) times daily. 5 mL 10   DULoxetine (CYMBALTA) 60 MG capsule Take 60 mg by mouth daily.     ezetimibe (ZETIA) 10 MG tablet Take 10 mg by mouth daily.     Ferrous Sulfate Dried (HIGH POTENCY IRON) 65 MG TABS Take 1 tablet by  mouth daily.     fesoterodine (TOVIAZ) 8 MG TB24 tablet Take 8 mg by mouth every evening.      fluticasone (FLONASE) 50 MCG/ACT nasal spray Place 1 spray into both nostrils 2 (two) times daily as needed for allergies or rhinitis.     FREESTYLE LITE test strip      furosemide (LASIX) 20 MG tablet TAKE 1 TABLET DAILY 90 tablet 3   gabapentin (NEURONTIN) 300 MG capsule Take 300 mg by mouth 2 (two) times daily.     glimepiride (AMARYL) 1 MG tablet Take 1 mg by mouth daily.     hydrOXYzine (ATARAX/VISTARIL) 25 MG tablet Take 50 mg by mouth 3 (three) times daily.     Insulin Glargine (LANTUS SOLOSTAR) 100 UNIT/ML Solostar Pen Inject 37 Units into the skin daily before breakfast.     isosorbide dinitrate (ISORDIL) 30 MG tablet TAKE 1 TABLET THREE TIMES A DAY (Patient taking differently: 2 (two) times daily.) 270 tablet 3   Loratadine 10 MG CAPS Take by mouth.     metoprolol succinate (TOPROL-XL) 25 MG 24 hr tablet Take 12.5 mg by mouth 2 (two) times daily.     nitroGLYCERIN (NITROSTAT) 0.4 MG SL tablet Place 1 tablet (0.4 mg total) under the tongue every  5 (five) minutes as needed for up to 25 days for chest pain. (Patient taking differently: Place 0.4 mg under the tongue every 5 (five) minutes as needed for chest pain.) 90 tablet 0   pantoprazole (PROTONIX) 40 MG tablet Take 40 mg by mouth every evening.      potassium chloride SA (KLOR-CON M) 20 MEQ tablet TAKE 1 TABLET DAILY 90 tablet 3   rosuvastatin (CRESTOR) 10 MG tablet Take 10 mg by mouth daily before supper.     sitaGLIPtin (JANUVIA) 100 MG tablet Take 100 mg by mouth daily.     Vibegron (GEMTESA) 75 MG TABS Take by mouth.     vitamin B-12 (CYANOCOBALAMIN) 1000 MCG tablet Take 1,000 mcg by mouth daily.     NAMZARIC 28-10 MG CP24 Take 1 capsule by mouth daily. 90 capsule 4   No current facility-administered medications for this visit.    Allergies as of 06/09/2021 - Review Complete 06/09/2021  Allergen Reaction Noted   Codeine  03/03/2021    Metformin  03/09/2021   Oxycodone-acetaminophen  03/09/2021   Percocet [oxycodone-acetaminophen] Nausea And Vomiting 09/30/2011   Tape  03/09/2021   Tolterodine Other (See Comments) 03/09/2021   Latex Itching and Rash 11/22/2011    Vitals: BP (!) 157/64 (BP Location: Right Arm, Patient Position: Sitting)   Pulse 75   Ht 5' (1.524 m)   Wt 165 lb (74.8 kg)   BMI 32.22 kg/m  Last Weight:  Wt Readings from Last 1 Encounters:  06/09/21 165 lb (74.8 kg)   Last Height:   Ht Readings from Last 1 Encounters:  06/09/21 5' (1.524 m)     Physical exam: Exam: Gen: NAD, conversant, well nourised, obese, well groomed                     CV: RRR, no MRG. No Carotid Bruits. Dital LE peripheral edema, warm, nontender Eyes: Conjunctivae clear without exudates or hemorrhage  Neuro: Detailed Neurologic Exam  Speech:    Speech is normal; fluent and spontaneous with normal comprehension.  Cognition:     06/09/2021    1:38 PM 11/16/2017    4:06 PM 11/08/2016   12:20 PM  MMSE - Mini Mental State Exam  Orientation to time 5 5 5   Orientation to Place 3 5 5   Registration 3 3 3   Attention/ Calculation 3 3 5   Recall 3 2 2   Language- name 2 objects 1 2 2   Language- repeat 1 1 1   Language- follow 3 step command 3 3 3   Language- read & follow direction 1 1 1   Write a sentence 1 1 1   Copy design 1 1 1   Total score 25 27 29    Cranial Nerves:    The pupils are equal, round, and reactive to light. Pupils too small to visualize fundi. Decrease left upper outer quadrant otherwise Visual fields are full to finger confrontation. Extraocular movements are intact. Trigeminal sensation is intact and the muscles of mastication are normal. The face is symmetric. The palate elevates in the midline. Hearing intact to voice(wearing hearing aids). Voice is normal. Shoulder shrug is normal. The tongue has normal motion without fasciculations.   Coordination:    Normal finger to nose and heel to shin.    Gait: Good stride, good arm swing, using a cane  Motor Observation:    No asymmetry, no atrophy, and no involuntary movements noted. Tone:    Normal muscle tone.    Posture:    Posture  is normal.     Strength:    Strength is equal and intact  in the upper and lower limbs.      Sensation: intact to LT and pinprick distally in the feet     Reflex Exam:  DTR's:    Absent Ajs, patellars and biceps are 2+    Toes:    The toes are equiv  bilaterally.   Clonus:    Clonus is absent.    Assessment/Plan:  This is a patient well known to [place], we have seen her in the past for sleep apnea (on cpap) and for memory concerns. Last I saw her was in 2019 but she has been seen by our sleep team regularly for her cpap management(03/25/2021, 01/01/2020, 01/01/2019) and also in the past 09/25/2018 for stroke. PMHx DM and peripheral neuropathy, frequent falls using a walker, UTIs confusion, former smoker quit in 1996, chronic kidney disease stage III, chronic diastolic congestive heart failure, lower extremity edema, acquired thrombophilia, A-fib, on Lasix for lower extremity edema, migraine headaches, HTN, HLD, OSA, autonomic orthostatic hypotension, asymptomatic carotid stenosis, prior seizure and CAD with CABG, medial left occipital infarct 08/22/2018  - Patient appears to be very happy. Dxed with MCI in the past. Her MMSE was 29/30 2019 and today 25/30 - Continue Aricept and Namenda - RTC in one year  Meds ordered this encounter  Medications   NAMZARIC 28-10 MG CP24    Sig: Take 1 capsule by mouth daily.    Dispense:  90 capsule    Refill:  4    Cc: Shon Baton, MD,  Shon Baton, MD  Sarina Ill, MD  [facility] [address] [place], [place] [address]  Phone [phone] Fax [phone]   Sarina Ill, MD  [facility] [address] [place], [place] [address]  Phone [phone] Fax [phone]   I spent  over 45 minutes of face-to-face and non-face-to-face time with patient on the  1. MCI (mild cognitive impairment)    diagnosis.  This included previsit chart review, lab review, study review, order entry, electronic health record documentation, patient education on the different diagnostic and therapeutic options, counseling and coordination of care, risks and benefits of management, compliance, or risk factor reduction

## 2021-06-09 NOTE — Patient Instructions (Signed)
Continue current medications. 

## 2021-06-09 NOTE — Telephone Encounter (Signed)
Pt's daughter has called asking it be confirmed the order was sent to DME re: CPAP for pt.  Daughter informed that Dr Brett Fairy is out of the office until 06-13.  Daughter states DME telling her they have not received anything from Dr Brett Fairy, please call.

## 2021-06-09 NOTE — Telephone Encounter (Signed)
Called the patient back. Based off our previous conversation in may 8th. I reviewed the sleep study results with her and advised that it looks like the patient was not set up until aug 2019 with machine. That would mean that she would not be eligible for a new machine until aug 2024. Advised the patient that we can send a updated order for supplies for her so that they have a up to date order to get her supplies. The daughter was appreciative. The patient has a apt already scheduled with Ward Givens, NP may of 2024. Advised this apt will be just fine to complete for her cpap follow up as well and we can reorder the new machine for her to get in aug and they will be able to use the may office notes to get her set up. She verbalized understanding.

## 2021-06-10 DIAGNOSIS — E1149 Type 2 diabetes mellitus with other diabetic neurological complication: Secondary | ICD-10-CM | POA: Diagnosis not present

## 2021-06-10 DIAGNOSIS — E041 Nontoxic single thyroid nodule: Secondary | ICD-10-CM | POA: Diagnosis not present

## 2021-06-10 DIAGNOSIS — I4891 Unspecified atrial fibrillation: Secondary | ICD-10-CM | POA: Diagnosis not present

## 2021-06-10 DIAGNOSIS — N1831 Chronic kidney disease, stage 3a: Secondary | ICD-10-CM | POA: Diagnosis not present

## 2021-06-10 DIAGNOSIS — Z7901 Long term (current) use of anticoagulants: Secondary | ICD-10-CM | POA: Diagnosis not present

## 2021-06-10 DIAGNOSIS — I119 Hypertensive heart disease without heart failure: Secondary | ICD-10-CM | POA: Diagnosis not present

## 2021-06-10 DIAGNOSIS — E1142 Type 2 diabetes mellitus with diabetic polyneuropathy: Secondary | ICD-10-CM | POA: Diagnosis not present

## 2021-06-10 DIAGNOSIS — R4189 Other symptoms and signs involving cognitive functions and awareness: Secondary | ICD-10-CM | POA: Diagnosis not present

## 2021-06-10 DIAGNOSIS — I6990 Unspecified sequelae of unspecified cerebrovascular disease: Secondary | ICD-10-CM | POA: Diagnosis not present

## 2021-06-10 DIAGNOSIS — Z794 Long term (current) use of insulin: Secondary | ICD-10-CM | POA: Diagnosis not present

## 2021-06-10 DIAGNOSIS — I2510 Atherosclerotic heart disease of native coronary artery without angina pectoris: Secondary | ICD-10-CM | POA: Diagnosis not present

## 2021-06-10 DIAGNOSIS — E785 Hyperlipidemia, unspecified: Secondary | ICD-10-CM | POA: Diagnosis not present

## 2021-06-15 ENCOUNTER — Ambulatory Visit (INDEPENDENT_AMBULATORY_CARE_PROVIDER_SITE_OTHER): Payer: Medicare Other | Admitting: Ophthalmology

## 2021-06-15 ENCOUNTER — Encounter (INDEPENDENT_AMBULATORY_CARE_PROVIDER_SITE_OTHER): Payer: Self-pay | Admitting: Ophthalmology

## 2021-06-15 DIAGNOSIS — H209 Unspecified iridocyclitis: Secondary | ICD-10-CM

## 2021-06-15 DIAGNOSIS — H26492 Other secondary cataract, left eye: Secondary | ICD-10-CM | POA: Diagnosis not present

## 2021-06-15 DIAGNOSIS — I2510 Atherosclerotic heart disease of native coronary artery without angina pectoris: Secondary | ICD-10-CM

## 2021-06-15 DIAGNOSIS — T85398A Other mechanical complication of other ocular prosthetic devices, implants and grafts, initial encounter: Secondary | ICD-10-CM

## 2021-06-15 DIAGNOSIS — H4042X0 Glaucoma secondary to eye inflammation, left eye, stage unspecified: Secondary | ICD-10-CM

## 2021-06-15 DIAGNOSIS — H4312 Vitreous hemorrhage, left eye: Secondary | ICD-10-CM

## 2021-06-15 NOTE — Assessment & Plan Note (Signed)
Vision loss OS with intracapsular blood.

## 2021-06-15 NOTE — Progress Notes (Signed)
06/15/2021     CHIEF COMPLAINT Patient presents for  Chief Complaint  Patient presents with   Retina Follow Up      HISTORY OF PRESENT ILLNESS: Linda Oliver is a 88 y.o. female who presents to the clinic today for:   HPI     Retina Follow Up           Diagnosis: Other   Laterality: left eye         Comments   1 yr FU OU OCT FP. Pt stated blood sugar was low,pt requested something sweet. Pt was given a granola bar and sprite to maintain blood sugar. Pt was able to proceed with the visit per Dr. Radene Ou approval.  Pt stated, "yesterday was a perfect day. It was clear but in my left eye it started to become cloudy. I saw Dr. Zadie Rhine about 9 weeks ago for my concern but I still see a cloudy vision in my left eye.  Pt denies FOL but still sees floaters in right eye.          Last edited by Silvestre Moment on 06/15/2021  3:52 PM.      Referring physician: Rutherford Guys, [place],  [place] [address]  HISTORICAL INFORMATION:   Selected notes from the MEDICAL RECORD NUMBER    Lab Results  Component Value Date   HGBA1C 8.7 (H) 08/23/2018     CURRENT MEDICATIONS: Current Outpatient Medications (Ophthalmic Drugs)  Medication Sig   cyclopentolate (CYCLOGYL) 1 % ophthalmic solution Place 1 drop into the left eye 2 (two) times daily.   No current facility-administered medications for this visit. (Ophthalmic Drugs)   Current Outpatient Medications (Other)  Medication Sig   acetaminophen (TYLENOL) 500 MG tablet Take 500 mg by mouth every 4 (four) hours as needed for moderate pain.   apixaban (ELIQUIS) 5 MG TABS tablet Take 1 tablet (5 mg total) by mouth 2 (two) times daily. Will need to see MD for future refills.   B-D UF III MINI PEN NEEDLES 31G X 5 MM MISC SMARTSIG:1 Each SUB-Q Daily   busPIRone (BUSPAR) 7.5 MG tablet Take 7.5 mg by mouth 2 (two) times daily.   clopidogrel (PLAVIX) 75 MG tablet Take 1 tablet (75 mg total) by mouth daily.   DULoxetine  (CYMBALTA) 60 MG capsule Take 60 mg by mouth daily.   ezetimibe (ZETIA) 10 MG tablet Take 10 mg by mouth daily.   Ferrous Sulfate Dried (HIGH POTENCY IRON) 65 MG TABS Take 1 tablet by mouth daily.   fesoterodine (TOVIAZ) 8 MG TB24 tablet Take 8 mg by mouth every evening.    fluticasone (FLONASE) 50 MCG/ACT nasal spray Place 1 spray into both nostrils 2 (two) times daily as needed for allergies or rhinitis.   FREESTYLE LITE test strip    furosemide (LASIX) 20 MG tablet TAKE 1 TABLET DAILY   gabapentin (NEURONTIN) 300 MG capsule Take 300 mg by mouth 2 (two) times daily.   glimepiride (AMARYL) 1 MG tablet Take 1 mg by mouth daily.   hydrOXYzine (ATARAX/VISTARIL) 25 MG tablet Take 50 mg by mouth 3 (three) times daily.   Insulin Glargine (LANTUS SOLOSTAR) 100 UNIT/ML Solostar Pen Inject 37 Units into the skin daily before breakfast.   isosorbide dinitrate (ISORDIL) 30 MG tablet TAKE 1 TABLET THREE TIMES A DAY (Patient taking differently: 2 (two) times daily.)   Loratadine 10 MG CAPS Take by mouth.   metoprolol succinate (TOPROL-XL) 25 MG 24 hr  tablet Take 12.5 mg by mouth 2 (two) times daily.   NAMZARIC 28-10 MG CP24 Take 1 capsule by mouth daily.   nitroGLYCERIN (NITROSTAT) 0.4 MG SL tablet Place 1 tablet (0.4 mg total) under the tongue every 5 (five) minutes as needed for up to 25 days for chest pain. (Patient taking differently: Place 0.4 mg under the tongue every 5 (five) minutes as needed for chest pain.)   pantoprazole (PROTONIX) 40 MG tablet Take 40 mg by mouth every evening.    potassium chloride SA (KLOR-CON M) 20 MEQ tablet TAKE 1 TABLET DAILY   rosuvastatin (CRESTOR) 10 MG tablet Take 10 mg by mouth daily before supper.   sitaGLIPtin (JANUVIA) 100 MG tablet Take 100 mg by mouth daily.   Vibegron (GEMTESA) 75 MG TABS Take by mouth.   vitamin B-12 (CYANOCOBALAMIN) 1000 MCG tablet Take 1,000 mcg by mouth daily.   No current facility-administered medications for this visit. (Other)       REVIEW OF SYSTEMS: ROS   Negative for: Constitutional, Gastrointestinal, Neurological, Skin, Genitourinary, Musculoskeletal, HENT, Endocrine, Cardiovascular, Eyes, Respiratory, Psychiatric, Allergic/Imm, Heme/Lymph Last edited by Silvestre Moment on 06/15/2021  3:52 PM.       ALLERGIES Allergies  Allergen Reactions   Codeine     vomiting   Metformin     Other reaction(s): nausea, vomitting and diarrhea   Oxycodone-Acetaminophen     Other reaction(s): Unknown   Percocet [Oxycodone-Acetaminophen] Nausea And Vomiting   Tape     Other reaction(s): Unknown   Tolterodine Other (See Comments)   Latex Itching and Rash    EKG leads    PAST MEDICAL HISTORY Past Medical History:  Diagnosis Date   Anginal pain ([place])    occ   Anxiety    Broken ankle    Bullous pemphigus    CAD (coronary artery disease)    [facility]   80% stenosis ostial Left main, 30% stenosis proximal LAD, 80% stenosis proximal RCA CABG  -  LIMA to LAD, SVG to int, SVG to [place], [place]    Depression    Diabetic peripheral neuropathy ([place])    Encounter for loop recorder check 04/11/2019   GERD (gastroesophageal reflux disease)    H/O: stroke: Left occipital stroke and vision loss 08/24/2018   Headache(784.0)    hx migraines   Heart murmur    mvp   Hyperlipidemia    Hypertension    Hypertensive heart disease    Loop recorder Reveal 08/24/2018 04/11/2019   Scheduled Remote loop recorder check 03/10/2019: The presenting rhythm is sinus bradycardia. There were 0 symptomatic patient activations recorded. There were 275 AF episodes and 0 AT episodes recorded, representing a 4.2 % cumulative AT/AF burden. The longest episode lasted 00:03:40:00 in duration. Some false with PAC. There were 0 tachy episodes detected. There were 0 pause episodes detected. Th   Nasal bleeding    occ due to O2   OA (osteoarthritis)    Obesity (BMI 30-39.9)    Obstructive sleep apnea on CPAP    Osteopenia    Paroxysmal  atrial fibrillation ([place]) 04/17/2019   Peripheral neuropathy    Seizure ([place])    2009   Sleep apnea    dx 86yr ago doesnt use cpap but uses O2 at nite 2L   Stones in the urinary tract    Type 2 diabetes mellitus with vascular disease ([place]    UTI (lower urinary tract infection)    Past Surgical History:  Procedure Laterality Date   ABDOMINAL HYSTERECTOMY  70's   CARDIAC CATHETERIZATION     CARPAL TUNNEL RELEASE Bilateral    CATARACT EXTRACTION W/PHACO Bilateral    CHOLECYSTECTOMY     CORONARY ANGIOPLASTY WITH STENT PLACEMENT  2023   CORONARY ARTERY BYPASS GRAFT  1996   LOOP RECORDER INSERTION N/A 08/24/2018   Procedure: LOOP RECORDER INSERTION;  Surgeon: Constance Haw, MD;  Location: [facility];  Service: Cardiovascular;  Laterality: N/A;   SHOULDER ARTHROSCOPY     rt   SHOULDER ARTHROSCOPY WITH ROTATOR CUFF REPAIR AND SUBACROMIAL DECOMPRESSION  12/01/2011   Procedure: SHOULDER ARTHROSCOPY WITH ROTATOR CUFF REPAIR AND SUBACROMIAL DECOMPRESSION;  Surgeon: Nita Sells, MD;  Location: [facility];  Service: Orthopedics;  Laterality: Left;  shoulder debridement calcific tendonitis    FAMILY HISTORY Family History  Problem Relation Age of Onset   Coronary artery disease Mother    Heart disease Father    Cancer Father        skin   Coronary artery disease Father    Cancer Brother    Cancer - Colon Brother        brain, Lymphoma    SOCIAL HISTORY Social History   Tobacco Use   Smoking status: Former    Packs/day: 1.00    Years: 30.00    Pack years: 30.00    Types: Cigarettes    Quit date: 11/25/1986    Years since quitting: 34.5   Smokeless tobacco: Never  Vaping Use   Vaping Use: Never used  Substance Use Topics   Alcohol use: No   Drug use: No         OPHTHALMIC EXAM:  Base Eye Exam     Visual Acuity (ETDRS)       Right Left   Dist cc 20/30 20/70 -2   Dist ph cc NI 20/60 -2    Correction: Glasses         Tonometry (Tonopen,  3:59 PM)       Right Left   Pressure 10 10         Pupils       Pupils APD   Right PERRL None   Left PERRL None         Visual Fields       Left Right    Full Full         Extraocular Movement       Right Left    Full Full         Neuro/Psych     Oriented x3: Yes   Mood/Affect: Normal         Dilation     Both eyes: 1.0% Mydriacyl, 2.5% Phenylephrine @ 3:59 PM           Slit Lamp and Fundus Exam     External Exam       Right Left   External Normal Normal         Slit Lamp Exam       Right Left   Lids/Lashes Normal Normal   Conjunctiva/Sclera White and quiet White and quiet   Cornea Clear Clear   Anterior Chamber Deep and quiet Deep and quiet   Iris Round and reactive Round and reactive, no 3 times daily effects when dilated   Lens Posterior chamber intraocular lens, Open posterior capsule Posterior chamber intraocular lens, with heme in the cul de sac of capsule, superonasal, in the capsule, not anterior and posterior.  This points to the base of the haptic superonasally and photographic images captured on my phone through the microscope.This suggest there may be ciliary body or peripheral iris chafe from the haptic although there are no 3 times daily effect seen on on iris examination   Anterior Vitreous Normal Normal         Fundus Exam       Right Left   Posterior Vitreous Posterior vitreous detachment AVITRIC, trace cell, RBC   Disc Normal [place]   C/D Ratio 0.35 0.55   Macula Epiretinal membrane seen on OCT is not clinically evident Old macular hole, close negative Watzke   Vessels Normal, no DR Normal, no DR   Periphery Normal Normal, no holes or tears            IMAGING AND PROCEDURES  Imaging and Procedures for 06/15/21  OCT, Retina - OU - Both Eyes       Right Eye Quality was good. Scan locations included subfoveal. Central Foveal Thickness: 284. Progression has been stable. Findings include abnormal foveal  contour, epiretinal membrane.   Left Eye Quality was good. Scan locations included subfoveal. Central Foveal Thickness: 270. Findings include abnormal foveal contour.   Notes OD minor epiretinal membrane no impact on acuity observe  OS classic findings of post macular hole and its repair subtle atrophy temporally, media opacity     Color Fundus Photography Optos - OU - Both Eyes       Right Eye Progression has been stable. Disc findings include normal observations. Macula : epiretinal membrane.   Left Eye Progression has been stable. Disc findings include normal observations. Macula : normal observations. Vessels : normal observations. Periphery : normal observations.   Notes OD with posterior vitreous detachment, clear media and minor epiretinal membrane without topographic distortion  OS, history of macular hole, dispersed vitreous hemorrhage left eye retina attached     Yag Capsulotomy - OS - Left Eye       Time Out Confirmed correct patient, procedure, site, and patient consented.   Anesthesia No anesthesia was used. Anesthesia medications included Alphagan 0.15%.   Laser Information The type of laser was yag. Total spots was 35. The energy was 1.5 mj. Total energy was 50 mj.   Post-op The patient tolerated the procedure well. There were no complications. The patient received written and verbal post procedure care education.   Notes Intracapsular hemorrhage released into vit with YAG posterior capsulotomy             ASSESSMENT/PLAN:  Uveitis-hyphema-glaucoma syndrome of left eye Vision loss OS with intracapsular blood.  Posterior capsular opacification, left Posterior capsular intact with intracapsular blood layer posterior to the IOL.  We will need to perform YAG capsulotomy so as to allow dispersion of the blood in the capsule into the remainder of the vitreous cavity.  I explained that while this will unblock the vision it will create a blurring  later on that hopefully naturally will disperse as it typically does     ICD-10-CM   1. Posterior capsular opacification, left  H26.492 Yag Capsulotomy - OS - Left Eye    2. Vitreous hemorrhage of left eye (HCC)  H43.12 OCT, Retina - OU - Both Eyes    Color Fundus Photography Optos - OU - Both Eyes    3. Uveitis-hyphema-glaucoma syndrome of left eye  T85.398A    H20.9    H40.42X0       1.  Capsulotomy performed today left eye for  intracapsular capture of hemorrhage layered in a sedimentation" type of pattern in blocking the visual axis.  2.  Capsulotomy left eye successfully released the intracapsular hemorrhage into the vitreous cavity atraumatically.  3.  Follow-up in 1 week for pressure check, patient out of town until June 19  [place] Ordered this visit:  No orders of the defined types were placed in this encounter.      Return in about 11 days (around 06/26/2021) for dilate, OS, COLOR FP, OCT.  There are no Patient Instructions on file for this visit.   Explained the diagnoses, plan, and follow up with the patient and they expressed understanding.  Patient expressed understanding of the importance of proper follow up care.   Clent Demark Ellicia Alix M.D. Diseases & Surgery of the Retina and Vitreous [facility] 06/15/21     Abbreviations: M myopia (nearsighted); A astigmatism; H hyperopia (farsighted); P presbyopia; Mrx spectacle prescription;  CTL contact lenses; OD right eye; OS left eye; OU both eyes  XT exotropia; ET esotropia; PEK punctate epithelial keratitis; PEE punctate epithelial erosions; DES dry eye syndrome; MGD meibomian gland dysfunction; ATs artificial tears; PFAT's preservative free artificial tears; [place] nuclear sclerotic cataract; PSC posterior subcapsular cataract; ERM epi-retinal membrane; PVD posterior vitreous detachment; RD retinal detachment; DM diabetes mellitus; DR diabetic retinopathy; NPDR non-proliferative diabetic retinopathy;  PDR proliferative diabetic retinopathy; CSME clinically significant macular edema; DME diabetic macular edema; dbh dot blot hemorrhages; CWS cotton wool spot; POAG primary open angle glaucoma; C/D cup-to-disc ratio; HVF humphrey visual field; GVF goldmann visual field; OCT optical coherence tomography; IOP intraocular pressure; BRVO Branch retinal vein occlusion; CRVO central retinal vein occlusion; CRAO central retinal artery occlusion; BRAO branch retinal artery occlusion; RT retinal tear; SB scleral buckle; PPV pars plana vitrectomy; VH Vitreous hemorrhage; PRP panretinal laser photocoagulation; IVK intravitreal kenalog; VMT vitreomacular traction; MH Macular hole;  NVD neovascularization of the disc; NVE neovascularization elsewhere; AREDS age related eye disease study; ARMD age related macular degeneration; POAG primary open angle glaucoma; EBMD epithelial/anterior basement membrane dystrophy; ACIOL anterior chamber intraocular lens; IOL intraocular lens; PCIOL posterior chamber intraocular lens; Phaco/IOL phacoemulsification with intraocular lens placement; [place] photorefractive keratectomy; LASIK laser assisted in situ keratomileusis; HTN hypertension; DM diabetes mellitus; COPD chronic obstructive pulmonary disease

## 2021-06-15 NOTE — Assessment & Plan Note (Signed)
Posterior capsular intact with intracapsular blood layer posterior to the IOL.  We will need to perform YAG capsulotomy so as to allow dispersion of the blood in the capsule into the remainder of the vitreous cavity.  I explained that while this will unblock the vision it will create a blurring later on that hopefully naturally will disperse as it typically does

## 2021-06-17 ENCOUNTER — Encounter: Payer: Self-pay | Admitting: Cardiology

## 2021-06-17 ENCOUNTER — Ambulatory Visit: Payer: Medicare Other | Admitting: Cardiology

## 2021-06-17 VITALS — BP 125/43 | HR 76 | Temp 98.1°F | Resp 16 | Ht 60.0 in | Wt 166.0 lb

## 2021-06-17 DIAGNOSIS — I2510 Atherosclerotic heart disease of native coronary artery without angina pectoris: Secondary | ICD-10-CM | POA: Diagnosis not present

## 2021-06-17 DIAGNOSIS — I480 Paroxysmal atrial fibrillation: Secondary | ICD-10-CM

## 2021-06-17 DIAGNOSIS — I5032 Chronic diastolic (congestive) heart failure: Secondary | ICD-10-CM

## 2021-06-17 DIAGNOSIS — Z9889 Other specified postprocedural states: Secondary | ICD-10-CM | POA: Diagnosis not present

## 2021-06-17 NOTE — Progress Notes (Signed)
Primary Physician/Referring:  Shon Baton, MD  Patient ID: Linda Oliver, female    DOB: 22-Jan-1932, 88 y.o.   MRN: [id number]  Chief Complaint  Patient presents with   Coronary Artery Disease   Follow-up   HPI:    Linda Oliver  is a 88 y.o. Caucasian female patient with CAD s/p CABG x 3 in 1996 in [place], HTN, hyperlipidemia, diabetes with PVD and stage 3a CKD and sleep apnea on CPAP and now compliant, carotid stenosis, and history of  autonomic orthostatic hypotension, CVA with medial left occipital infarct in embolic pattern by MRI in 2020 and loop recorder implanted showed paroxysmal atrial fibrillation.  Admitted with unstable angina and underwent coronary angiography on 03/05/2021 and overlapping stent plantation to the right coronary artery native as the SVG to RCA was functionally occluded.  Left grafts are widely patent.  She now presents for 3-monthfollow-up.  She is presently doing well, has not had any recurrence of chest pain, states that she is tolerating Plavix without bleeding diathesis.  She is still continuing to walk in the house with the help of a walker and has no specific complaints today.  She is accompanied by her son-in-law.  Past Medical History:  Diagnosis Date   Anginal pain ([place]    occ   Anxiety    Broken ankle    Bullous pemphigus    CAD (coronary artery disease)    [place]  80% stenosis ostial Left main, 30% stenosis proximal LAD, 80% stenosis proximal RCA CABG  -  LIMA to LAD, SVG to int, SVG to [place]    Depression    Diabetic peripheral neuropathy ([place]    Encounter for loop recorder check 04/11/2019   GERD (gastroesophageal reflux disease)    H/O: stroke: Left occipital stroke and vision loss 08/24/2018   Headache(784.0)    hx migraines   Heart murmur    mvp   Hyperlipidemia    Hypertension    Hypertensive heart disease    Loop recorder Reveal 08/24/2018 04/11/2019   Scheduled Remote loop recorder check 03/10/2019: The  presenting rhythm is sinus bradycardia. There were 0 symptomatic patient activations recorded. There were 275 AF episodes and 0 AT episodes recorded, representing a 4.2 % cumulative AT/AF burden. The longest episode lasted 00:03:40:00 in duration. Some false with PAC. There were 0 tachy episodes detected. There were 0 pause episodes detected. Th   Nasal bleeding    occ due to O2   OA (osteoarthritis)    Obesity (BMI 30-39.9)    Obstructive sleep apnea on CPAP    Osteopenia    Paroxysmal atrial fibrillation ([place] 04/17/2019   Peripheral neuropathy    Seizure ([place]    2009   Sleep apnea    dx 333yrago doesnt use cpap but uses O2 at nite 2L   Stones in the urinary tract    Type 2 diabetes mellitus with vascular disease ([place]   UTI (lower urinary tract infection)    ROS  Review of Systems  Cardiovascular:  Negative for chest pain, dyspnea on exertion and leg swelling.  Respiratory:  Positive for snoring (on CPAP).     Objective  Blood pressure (!) 125/43, pulse 76, temperature 98.1 F (36.7 C), temperature source Temporal, resp. rate 16, height 5' (1.524 m), weight 166 lb (75.3 kg), SpO2 93 %.     06/17/2021   11:14 AM 06/17/2021   11:09 AM 06/09/2021  1:36 PM  Vitals with BMI  Height  5' 0" 5' 0"  Weight  166 lbs 165 lbs  BMI  32.42 32.22  Systolic 125 106 157  Diastolic 43 56 64  Pulse 76 84 75     Physical Exam Vitals reviewed.  Constitutional:      Appearance: She is well-developed.  Neck:     Vascular: Carotid bruit (bilateral) present. No JVD.  Cardiovascular:     Rate and Rhythm: Normal rate and regular rhythm.     Pulses: Intact distal pulses.          Radial pulses are 2+ on the right side and 2+ on the left side.       Posterior tibial pulses are 2+ on the right side and 2+ on the left side.     Heart sounds: Murmur heard.     Systolic murmur is present at the upper right sternal border.     No gallop.     Comments: Femoral access site well-healed without  evidence of ecchymosis, hematoma, bruit. Pulmonary:     Effort: Pulmonary effort is normal. No accessory muscle usage or respiratory distress.     Breath sounds: Normal breath sounds. No wheezing, rhonchi or rales.  Chest:     Chest wall: Tenderness (location of loop recorder) present.  Abdominal:     General: Abdomen is flat.     Palpations: Abdomen is soft.  Musculoskeletal:     Right shoulder: Normal.     Right lower leg: Edema (trace ankle edema) present.     Left lower leg: Edema (trace ankle edema) present.    Laboratory examination:  External labs:  Troponin trend 03/03/21 --> 03/05/2021:  97 --> 220 -->  241 -->  193  03/06/2021: Sodium 138, potassium 4.4, BUN 16, creatinine 0.93, GFR 57 Hgb 11.9, HCT 37, platelet 212, MCV 84 Magnesium 1.9  03/04/2021: Total cholesterol 99, triglycerides 86, HDL 30, LDL 44 TSH 1.89 BNP 722 A1c 7.6%  01/14/2021:  GLUCOSE 102   60 - 110 BUN  13   5 - 23 CREATININE 0.9   0.3 - 1.5 eGFR Non-African American 59.4    SODIUM 142   135 - 148 POTASSIUM 4.4   3.5 - 5.3  CHOLESTEROL  108   100 - 200 TRIGLYCERIDES 80   30 - 149 HDL   36   40 - 60 LDL   56   < CHOL/HDL RATIO 3.0   <  TSH    2.29   HGB 11.5   12.0 - 18.0 HCT 36.3   37.0 - 51.0 MCV 88.1   80.0 - 97.0 MCH 28.0   26.0 - 32.0 MCHC 31.8   31.0 - 36.0 RDW 14.8   12.3 - 15.4 PLT 240   140 - 440  Allergies   Allergies  Allergen Reactions   Codeine     vomiting   Metformin     Other reaction(s): nausea, vomitting and diarrhea   Oxycodone-Acetaminophen     Other reaction(s): Unknown   Percocet [Oxycodone-Acetaminophen] Nausea And Vomiting   Tape     Other reaction(s): Unknown   Tolterodine Other (See Comments)   Latex Itching and Rash    EKG leads   Medications Prior to Visit:   Outpatient Medications Prior to Visit  Medication Sig Dispense Refill   acetaminophen (TYLENOL) 500 MG tablet Take 500 mg by mouth every 4 (four) hours as needed for moderate pain.      apixaban (  ELIQUIS) 5 MG TABS tablet Take 1 tablet (5 mg total) by mouth 2 (two) times daily. Will need to see MD for future refills. 180 tablet 1   B-D UF III MINI PEN NEEDLES 31G X 5 MM MISC SMARTSIG:1 Each SUB-Q Daily     busPIRone (BUSPAR) 7.5 MG tablet Take 7.5 mg by mouth 2 (two) times daily.     clopidogrel (PLAVIX) 75 MG tablet Take 1 tablet (75 mg total) by mouth daily. 90 tablet 3   cyclopentolate (CYCLOGYL) 1 % ophthalmic solution Place 1 drop into the left eye 2 (two) times daily. 5 mL 10   DULoxetine (CYMBALTA) 60 MG capsule Take 60 mg by mouth daily.     ezetimibe (ZETIA) 10 MG tablet Take 10 mg by mouth daily.     Ferrous Sulfate Dried (HIGH POTENCY IRON) 65 MG TABS Take 1 tablet by mouth daily.     fesoterodine (TOVIAZ) 8 MG TB24 tablet Take 8 mg by mouth every evening.      fluticasone (FLONASE) 50 MCG/ACT nasal spray Place 1 spray into both nostrils 2 (two) times daily as needed for allergies or rhinitis.     FREESTYLE LITE test strip      furosemide (LASIX) 20 MG tablet TAKE 1 TABLET DAILY 90 tablet 3   gabapentin (NEURONTIN) 300 MG capsule Take 300 mg by mouth 2 (two) times daily.     glimepiride (AMARYL) 1 MG tablet Take 1 mg by mouth daily.     hydrOXYzine (ATARAX/VISTARIL) 25 MG tablet Take 50 mg by mouth 3 (three) times daily.     Insulin Glargine (LANTUS SOLOSTAR) 100 UNIT/ML Solostar Pen Inject 37 Units into the skin daily before breakfast.     isosorbide dinitrate (ISORDIL) 30 MG tablet TAKE 1 TABLET THREE TIMES A DAY (Patient taking differently: 2 (two) times daily.) 270 tablet 3   Loratadine 10 MG CAPS Take by mouth.     metoprolol succinate (TOPROL-XL) 25 MG 24 hr tablet Take 12.5 mg by mouth 2 (two) times daily.     NAMZARIC 28-10 MG CP24 Take 1 capsule by mouth daily. 90 capsule 4   nitroGLYCERIN (NITROSTAT) 0.4 MG SL tablet Place 1 tablet (0.4 mg total) under the tongue every 5 (five) minutes as needed for up to 25 days for chest pain. (Patient taking differently:  Place 0.4 mg under the tongue every 5 (five) minutes as needed for chest pain.) 90 tablet 0   pantoprazole (PROTONIX) 40 MG tablet Take 40 mg by mouth every evening.      potassium chloride SA (KLOR-CON M) 20 MEQ tablet TAKE 1 TABLET DAILY 90 tablet 3   rosuvastatin (CRESTOR) 10 MG tablet Take 10 mg by mouth daily before supper.     sitaGLIPtin (JANUVIA) 100 MG tablet Take 100 mg by mouth daily.     Vibegron (GEMTESA) 75 MG TABS Take by mouth.     vitamin B-12 (CYANOCOBALAMIN) 1000 MCG tablet Take 1,000 mcg by mouth daily.     No facility-administered medications prior to visit.   Final Medications at End of Visit    Current Meds  Medication Sig   acetaminophen (TYLENOL) 500 MG tablet Take 500 mg by mouth every 4 (four) hours as needed for moderate pain.   apixaban (ELIQUIS) 5 MG TABS tablet Take 1 tablet (5 mg total) by mouth 2 (two) times daily. Will need to see MD for future refills.   B-D UF III MINI PEN NEEDLES 31G X 5 MM MISC SMARTSIG:1   Each SUB-Q Daily   busPIRone (BUSPAR) 7.5 MG tablet Take 7.5 mg by mouth 2 (two) times daily.   clopidogrel (PLAVIX) 75 MG tablet Take 1 tablet (75 mg total) by mouth daily.   cyclopentolate (CYCLOGYL) 1 % ophthalmic solution Place 1 drop into the left eye 2 (two) times daily.   DULoxetine (CYMBALTA) 60 MG capsule Take 60 mg by mouth daily.   ezetimibe (ZETIA) 10 MG tablet Take 10 mg by mouth daily.   Ferrous Sulfate Dried (HIGH POTENCY IRON) 65 MG TABS Take 1 tablet by mouth daily.   fesoterodine (TOVIAZ) 8 MG TB24 tablet Take 8 mg by mouth every evening.    fluticasone (FLONASE) 50 MCG/ACT nasal spray Place 1 spray into both nostrils 2 (two) times daily as needed for allergies or rhinitis.   FREESTYLE LITE test strip    furosemide (LASIX) 20 MG tablet TAKE 1 TABLET DAILY   gabapentin (NEURONTIN) 300 MG capsule Take 300 mg by mouth 2 (two) times daily.   glimepiride (AMARYL) 1 MG tablet Take 1 mg by mouth daily.   hydrOXYzine (ATARAX/VISTARIL) 25  MG tablet Take 50 mg by mouth 3 (three) times daily.   Insulin Glargine (LANTUS SOLOSTAR) 100 UNIT/ML Solostar Pen Inject 37 Units into the skin daily before breakfast.   isosorbide dinitrate (ISORDIL) 30 MG tablet TAKE 1 TABLET THREE TIMES A DAY (Patient taking differently: 2 (two) times daily.)   Loratadine 10 MG CAPS Take by mouth.   metoprolol succinate (TOPROL-XL) 25 MG 24 hr tablet Take 12.5 mg by mouth 2 (two) times daily.   NAMZARIC 28-10 MG CP24 Take 1 capsule by mouth daily.   nitroGLYCERIN (NITROSTAT) 0.4 MG SL tablet Place 1 tablet (0.4 mg total) under the tongue every 5 (five) minutes as needed for up to 25 days for chest pain. (Patient taking differently: Place 0.4 mg under the tongue every 5 (five) minutes as needed for chest pain.)   pantoprazole (PROTONIX) 40 MG tablet Take 40 mg by mouth every evening.    potassium chloride SA (KLOR-CON M) 20 MEQ tablet TAKE 1 TABLET DAILY   rosuvastatin (CRESTOR) 10 MG tablet Take 10 mg by mouth daily before supper.   sitaGLIPtin (JANUVIA) 100 MG tablet Take 100 mg by mouth daily.   Vibegron (GEMTESA) 75 MG TABS Take by mouth.   vitamin B-12 (CYANOCOBALAMIN) 1000 MCG tablet Take 1,000 mcg by mouth daily.   Radiology:   CT Angio Neck 08/23/2018: 1. Left subclavian origin high-grade stenosis with delayed enhancement of the distal left vertebral artery. Additionally, there is irregular plaque/mural thrombus involving the aortic isthmus and left subclavian origin. 2. Left PCA branch occlusion correlating with the known acute infarct. 3. Cervical carotid atherosclerosis with up to 50% stenosis at the left ICA bulb. 4. 3 cm right thyroid mass, recommend elective ultrasound.  CXR PA/Lat 05/10/2019: The heart size and mediastinal contours are within normal limits. Prior CABG again noted. Aortic atherosclerosis incidentally noted. Cardiac loop recorder is seen anterior chest wall. Both lungs are clear. The visualized skeletal structures are  unremarkable.  Chest x-ray 10/07/2019: Stable cardiomediastinal silhouette. No pneumothorax or pleural effusion is noted. Both lungs are clear. The visualized skeletal structures are unremarkable IMPRESSION: No active cardiopulmonary disease.  Cardiac Studies:   Coronary angiogram 1996: [place] 80% stenosis ostial Left main, 30% stenosis proximal LAD, 80% stenosis proximal RCA CABG  -  LIMA to LAD, SVG to int, SVG to [place], [facility]   Lexiscan Tetrofosmin Stress Test  05/15/2019: Nondiagnostic ECG stress. Myocardial perfusion is abnormal. Mild degree large extent perfusion defect consistent with moderate ischemia located in the apical inferior wall, mid inferior wall, mid inferoseptal wall, basal inferior wall and basal inferoseptal wall (Right Coronary Artery region) of left ventricle. Gated SPECT imaging of the left ventricle was normal,  demonstrating hypokinesis of the apical inferior wall, mid inferior wall and basal inferior wall.  Stress LV EF is normal 55%.  Compared to the study done on 10/24/2016, previously minimal ischemia was evident and LVEF was 72%.  Low risk.  Carotid artery duplex 03/04/2021: < 50% ICA stenosis bilaterally. Moderate plaquing bilaterally at bifurcations/proximal to mid ICAs.  Antegrade right vertebral artery flow.  Retrograde left VA flow - suggestive of possible subclavian disease, though not obvious subclavian stenosis noted w/out evidence of elevated velocity.  Consider further imaging as clinically indicated. Compared to study 11/27/2019, no significant change.  Follow-up in 6 months is appropriate if clinically indicated  Echocardiogram 03/04/2021: 1.  Left ventricle: The cavity size is normal. There is mild concentric hypertrophy. Systolic function is normal. The estimated ejection fraction is 50-55%. Wall motion is normal; there are no regional wall motion abnormalities. Grade II diastolic dysfunction.  2.  Aortic valve: The valve is mildly  thickened and mildly calcified. There is mild to moderate, 1-2+ regurgitation.  3.  Mitral valve: The valve leaflets are mildly thickened. There is moderate 2+ regurgitation.  4.  Right ventricle: The cavity size is normal. Systolic function is normal.  5.  Tricuspid valve: There is trivial, 2+ regurgitation.  6.  Pulmonary arteries: Systolic pressure is 60-70 mm Hg.  7.  Inferior vena cava: The IVC is dilated.  Compared to echocardiogram 11/27/2019: MR progressed from mild to moderate, otherwise no significant change.  Coronary angiogram 03/05/2021: LM: Trifurcates.  Ostium 80 to 90% stenosis. LAD: Heavy calcification is noted with ostial 90% stenosis.  Complete filling from the LIMA is noted. LCx: Large vessel, no significant disease. RI: Large vessel, bifurcating vessel, ostial 80 to 90% stenosis.  Competitive filling from the vein graft is seen. RCA: Large-caliber vessel and a dominant vessel.  70% stenosis in the ostium and mid RCA has a sequential 80 to 90% stenosis. LIMA to LAD patent. SVG to RI widely patent. SVG to RCA severely diffusely diseased and nonfunctional graft.  Successful IVUS guided PCI of the proximal to distal right coronary artery using 3.5 x 38 and a 3.5 x 38 mm Synergy DES.  Device Clinic: [employer] Loop recorder   Remote loop recorder transmission 02/15/2021: Predominant rhythm is normal sinus rhythm. There were 2 episodes of atrial fibrillation, 1 hour 48 minutes and 4 hours duration AT/AF burden 7.6%.  Unscheduled (Alert) 01/31/2020:  6 hours 38 minutes of A. fib on 01/29/2020. Total AF burden 22% since last transmission 01/11/2020. 11/15/2019: 5 hrs PAF on 10/8. 12/10/2019: 2.7% PAF with controlled ventricular response, 28 AF episodes and longest 2 hrs, 22 mins on 12/07/2019. Patient is on anticoagulation and will turn of alert and watch burden ongoing  EKG  03/11/2021: Probable sinus rhythm with left atrial abnormality.  Left axis.  Right bundle branch block.   Single PAC.  Nonspecific inferior and lateral ST depression, consider ischemia.  EKG 01/14/2021: Probably sinus rhythm with left atrial abnormality.  Normal axis, incomplete right bundle branch block.  Nonspecific inferior and lateral ST segment depression, consider subendocardial ischemia versus infarct.  Consider digitalis effect.  EKG similar to 10/01/2019, on 06/23/2020, she had right bundle branch block.    Assessment     ICD-10-CM   1. Coronary artery disease involving native coronary artery of native heart without angina pectoris  I25.10     2. Chronic diastolic (congestive) heart failure (HCC)  I50.32     3. Paroxysmal atrial fibrillation (HCC)  I48.0     4. Loop recorder [employer] Reveal [facility]  08/24/2018  Z98.890       CHA2DS2-VASc Score is 7.  Yearly risk of stroke: > 9.8% (F, A, HTN, DM, Vasc Dz).  Score of 1=0.6; 2=2.2; 3=3.2; 4=4.8; 5=7.2; 6=9.8; 7=>9.8) -(CHF; HTN; vasc disease DM,  Female = 1; Age <65 =0; 65-74 = 1,  >75 =2; stroke/embolism= 2).    No orders of the defined types were placed in this encounter.    There are no discontinued medications.    Recommendations:   AEDYN MCKEON  is a 88 y.o. Caucasian female patient with CAD s/p CABG x 3 in 1996 in [place], HTN, hyperlipidemia, diabetes with PVD and stage 3a CKD and sleep apnea on CPAP and now compliant, carotid stenosis, and history of  autonomic orthostatic hypotension, CVA with medial left occipital infarct in embolic pattern by MRI in 2020 and loop recorder implanted showed paroxysmal atrial fibrillation.  Admitted with unstable angina and underwent coronary angiography on 03/05/2021 and overlapping stent plantation to the right coronary artery native as the SVG to RCA was functionally occluded.  Left grafts are widely patent.  She now presents for 51-monthfollow-up.  She has not had any recurrence of chest pain.  She is tolerating Plavix without bleeding diathesis.   She is presently doing well, fortunately has not had  any further episodes of chest pain, blood pressure is also well controlled.  No bleeding diathesis on Plavix.  Advised her to continue Plavix for 2 more months until the end of August that is until the day of her birthday on 09/09/2019 when she returned 88years of age.  She will continue with Eliquis for atrial fibrillation.  She has loop recorder in place, last transmission was in January 2023.  She is not aware where the transmitter is, her son-in-law will go look in today and there would like to continue monitoring especially to evaluate her for A-fib burden and also if if she ever has syncope or fall, they would like to know if she will end up needing a pacemaker as well.  Both patient and son-in-law would like to continue monitoring.  We will try to provide them a transmitter if they cannot find the old one.  I will see her back in 6 months for follow-up.  She does complain of mild discomfort at the loop recorder implantation site that has been chronic.  OCEANIC-AF (Asundexian - factor XIa inhibitor PO BID vs Apixaban PO BID in patients with A. Fib for stroke prevention discussed with the patient, she is interested hence will be provided with more information.   JAdrian Prows MD, FNew Iberia Surgery Center LLC6/08/2021, 12:00 PM Office: [phone]: [phone]: [phone]

## 2021-07-05 ENCOUNTER — Other Ambulatory Visit: Payer: Self-pay | Admitting: Cardiology

## 2021-07-05 ENCOUNTER — Encounter (INDEPENDENT_AMBULATORY_CARE_PROVIDER_SITE_OTHER): Payer: Medicare Other | Admitting: Ophthalmology

## 2021-07-05 DIAGNOSIS — I25118 Atherosclerotic heart disease of native coronary artery with other forms of angina pectoris: Secondary | ICD-10-CM

## 2021-07-05 DIAGNOSIS — I10 Essential (primary) hypertension: Secondary | ICD-10-CM

## 2021-07-06 ENCOUNTER — Inpatient Hospital Stay (HOSPITAL_COMMUNITY)
Admission: EM | Admit: 2021-07-06 | Discharge: 2021-07-13 | DRG: 280 | Disposition: A | Discharge status: Home Health | Payer: Medicare Other | Attending: Internal Medicine | Admitting: Internal Medicine

## 2021-07-06 ENCOUNTER — Emergency Department (HOSPITAL_COMMUNITY): Payer: Medicare Other

## 2021-07-06 DIAGNOSIS — N390 Urinary tract infection, site not specified: Secondary | ICD-10-CM | POA: Diagnosis present

## 2021-07-06 DIAGNOSIS — N3000 Acute cystitis without hematuria: Secondary | ICD-10-CM | POA: Diagnosis not present

## 2021-07-06 DIAGNOSIS — E1142 Type 2 diabetes mellitus with diabetic polyneuropathy: Secondary | ICD-10-CM | POA: Diagnosis present

## 2021-07-06 DIAGNOSIS — Z79899 Other long term (current) drug therapy: Secondary | ICD-10-CM

## 2021-07-06 DIAGNOSIS — Z951 Presence of aortocoronary bypass graft: Secondary | ICD-10-CM

## 2021-07-06 DIAGNOSIS — E785 Hyperlipidemia, unspecified: Secondary | ICD-10-CM | POA: Diagnosis present

## 2021-07-06 DIAGNOSIS — R0602 Shortness of breath: Secondary | ICD-10-CM | POA: Diagnosis not present

## 2021-07-06 DIAGNOSIS — M1990 Unspecified osteoarthritis, unspecified site: Secondary | ICD-10-CM | POA: Diagnosis present

## 2021-07-06 DIAGNOSIS — E1159 Type 2 diabetes mellitus with other circulatory complications: Secondary | ICD-10-CM | POA: Diagnosis present

## 2021-07-06 DIAGNOSIS — I4510 Unspecified right bundle-branch block: Secondary | ICD-10-CM | POA: Diagnosis not present

## 2021-07-06 DIAGNOSIS — N289 Disorder of kidney and ureter, unspecified: Secondary | ICD-10-CM | POA: Diagnosis present

## 2021-07-06 DIAGNOSIS — R0902 Hypoxemia: Secondary | ICD-10-CM | POA: Diagnosis not present

## 2021-07-06 DIAGNOSIS — I130 Hypertensive heart and chronic kidney disease with heart failure and stage 1 through stage 4 chronic kidney disease, or unspecified chronic kidney disease: Principal | ICD-10-CM | POA: Diagnosis present

## 2021-07-06 DIAGNOSIS — Z91040 Latex allergy status: Secondary | ICD-10-CM

## 2021-07-06 DIAGNOSIS — I428 Other cardiomyopathies: Secondary | ICD-10-CM | POA: Diagnosis not present

## 2021-07-06 DIAGNOSIS — Z9049 Acquired absence of other specified parts of digestive tract: Secondary | ICD-10-CM

## 2021-07-06 DIAGNOSIS — R9431 Abnormal electrocardiogram [ECG] [EKG]: Secondary | ICD-10-CM | POA: Diagnosis not present

## 2021-07-06 DIAGNOSIS — Z7901 Long term (current) use of anticoagulants: Secondary | ICD-10-CM

## 2021-07-06 DIAGNOSIS — I509 Heart failure, unspecified: Secondary | ICD-10-CM | POA: Diagnosis not present

## 2021-07-06 DIAGNOSIS — Z87891 Personal history of nicotine dependence: Secondary | ICD-10-CM

## 2021-07-06 DIAGNOSIS — I110 Hypertensive heart disease with heart failure: Secondary | ICD-10-CM | POA: Diagnosis not present

## 2021-07-06 DIAGNOSIS — Z9989 Dependence on other enabling machines and devices: Secondary | ICD-10-CM | POA: Diagnosis not present

## 2021-07-06 DIAGNOSIS — I69398 Other sequelae of cerebral infarction: Secondary | ICD-10-CM | POA: Diagnosis not present

## 2021-07-06 DIAGNOSIS — Z90710 Acquired absence of both cervix and uterus: Secondary | ICD-10-CM

## 2021-07-06 DIAGNOSIS — G4733 Obstructive sleep apnea (adult) (pediatric): Secondary | ICD-10-CM

## 2021-07-06 DIAGNOSIS — R000 Tachycardia, unspecified: Secondary | ICD-10-CM | POA: Diagnosis not present

## 2021-07-06 DIAGNOSIS — E782 Mixed hyperlipidemia: Secondary | ICD-10-CM | POA: Diagnosis not present

## 2021-07-06 DIAGNOSIS — Z885 Allergy status to narcotic agent status: Secondary | ICD-10-CM

## 2021-07-06 DIAGNOSIS — E876 Hypokalemia: Secondary | ICD-10-CM | POA: Diagnosis present

## 2021-07-06 DIAGNOSIS — F419 Anxiety disorder, unspecified: Secondary | ICD-10-CM | POA: Diagnosis present

## 2021-07-06 DIAGNOSIS — Z888 Allergy status to other drugs, medicaments and biological substances status: Secondary | ICD-10-CM

## 2021-07-06 DIAGNOSIS — N189 Chronic kidney disease, unspecified: Secondary | ICD-10-CM | POA: Diagnosis present

## 2021-07-06 DIAGNOSIS — I10 Essential (primary) hypertension: Secondary | ICD-10-CM | POA: Diagnosis present

## 2021-07-06 DIAGNOSIS — E669 Obesity, unspecified: Secondary | ICD-10-CM | POA: Diagnosis present

## 2021-07-06 DIAGNOSIS — N1831 Chronic kidney disease, stage 3a: Secondary | ICD-10-CM | POA: Diagnosis present

## 2021-07-06 DIAGNOSIS — E1169 Type 2 diabetes mellitus with other specified complication: Secondary | ICD-10-CM | POA: Diagnosis not present

## 2021-07-06 DIAGNOSIS — Z7984 Long term (current) use of oral hypoglycemic drugs: Secondary | ICD-10-CM

## 2021-07-06 DIAGNOSIS — J811 Chronic pulmonary edema: Secondary | ICD-10-CM | POA: Diagnosis not present

## 2021-07-06 DIAGNOSIS — Z955 Presence of coronary angioplasty implant and graft: Secondary | ICD-10-CM

## 2021-07-06 DIAGNOSIS — Z91048 Other nonmedicinal substance allergy status: Secondary | ICD-10-CM

## 2021-07-06 DIAGNOSIS — I083 Combined rheumatic disorders of mitral, aortic and tricuspid valves: Secondary | ICD-10-CM | POA: Diagnosis not present

## 2021-07-06 DIAGNOSIS — I255 Ischemic cardiomyopathy: Secondary | ICD-10-CM | POA: Diagnosis present

## 2021-07-06 DIAGNOSIS — J181 Lobar pneumonia, unspecified organism: Secondary | ICD-10-CM | POA: Diagnosis present

## 2021-07-06 DIAGNOSIS — R0603 Acute respiratory distress: Secondary | ICD-10-CM | POA: Diagnosis not present

## 2021-07-06 DIAGNOSIS — Z6830 Body mass index (BMI) 30.0-30.9, adult: Secondary | ICD-10-CM

## 2021-07-06 DIAGNOSIS — J9601 Acute respiratory failure with hypoxia: Secondary | ICD-10-CM | POA: Diagnosis present

## 2021-07-06 DIAGNOSIS — J90 Pleural effusion, not elsewhere classified: Secondary | ICD-10-CM | POA: Diagnosis not present

## 2021-07-06 DIAGNOSIS — Z20822 Contact with and (suspected) exposure to covid-19: Secondary | ICD-10-CM | POA: Diagnosis not present

## 2021-07-06 DIAGNOSIS — I25110 Atherosclerotic heart disease of native coronary artery with unstable angina pectoris: Secondary | ICD-10-CM | POA: Diagnosis not present

## 2021-07-06 DIAGNOSIS — R0689 Other abnormalities of breathing: Secondary | ICD-10-CM | POA: Diagnosis not present

## 2021-07-06 DIAGNOSIS — I5020 Unspecified systolic (congestive) heart failure: Secondary | ICD-10-CM | POA: Diagnosis not present

## 2021-07-06 DIAGNOSIS — I5033 Acute on chronic diastolic (congestive) heart failure: Secondary | ICD-10-CM | POA: Diagnosis not present

## 2021-07-06 DIAGNOSIS — D649 Anemia, unspecified: Secondary | ICD-10-CM | POA: Diagnosis not present

## 2021-07-06 DIAGNOSIS — N179 Acute kidney failure, unspecified: Secondary | ICD-10-CM | POA: Diagnosis not present

## 2021-07-06 DIAGNOSIS — I5021 Acute systolic (congestive) heart failure: Secondary | ICD-10-CM | POA: Diagnosis not present

## 2021-07-06 DIAGNOSIS — I214 Non-ST elevation (NSTEMI) myocardial infarction: Secondary | ICD-10-CM | POA: Diagnosis present

## 2021-07-06 DIAGNOSIS — I119 Hypertensive heart disease without heart failure: Secondary | ICD-10-CM | POA: Diagnosis not present

## 2021-07-06 DIAGNOSIS — I25119 Atherosclerotic heart disease of native coronary artery with unspecified angina pectoris: Secondary | ICD-10-CM | POA: Diagnosis not present

## 2021-07-06 DIAGNOSIS — Z8249 Family history of ischemic heart disease and other diseases of the circulatory system: Secondary | ICD-10-CM

## 2021-07-06 DIAGNOSIS — F32A Depression, unspecified: Secondary | ICD-10-CM | POA: Diagnosis present

## 2021-07-06 DIAGNOSIS — E1122 Type 2 diabetes mellitus with diabetic chronic kidney disease: Secondary | ICD-10-CM | POA: Diagnosis not present

## 2021-07-06 DIAGNOSIS — I4819 Other persistent atrial fibrillation: Secondary | ICD-10-CM | POA: Diagnosis not present

## 2021-07-06 DIAGNOSIS — H547 Unspecified visual loss: Secondary | ICD-10-CM | POA: Diagnosis present

## 2021-07-06 DIAGNOSIS — E1151 Type 2 diabetes mellitus with diabetic peripheral angiopathy without gangrene: Secondary | ICD-10-CM | POA: Diagnosis present

## 2021-07-06 DIAGNOSIS — R509 Fever, unspecified: Secondary | ICD-10-CM | POA: Diagnosis not present

## 2021-07-06 DIAGNOSIS — I480 Paroxysmal atrial fibrillation: Secondary | ICD-10-CM | POA: Diagnosis present

## 2021-07-06 DIAGNOSIS — K219 Gastro-esophageal reflux disease without esophagitis: Secondary | ICD-10-CM | POA: Diagnosis present

## 2021-07-06 DIAGNOSIS — I4891 Unspecified atrial fibrillation: Secondary | ICD-10-CM | POA: Diagnosis not present

## 2021-07-06 DIAGNOSIS — J810 Acute pulmonary edema: Secondary | ICD-10-CM | POA: Diagnosis not present

## 2021-07-06 DIAGNOSIS — I5023 Acute on chronic systolic (congestive) heart failure: Secondary | ICD-10-CM | POA: Diagnosis not present

## 2021-07-06 DIAGNOSIS — Z794 Long term (current) use of insulin: Secondary | ICD-10-CM

## 2021-07-06 DIAGNOSIS — Z7902 Long term (current) use of antithrombotics/antiplatelets: Secondary | ICD-10-CM

## 2021-07-06 LAB — CBC
HCT: 36.6 % (ref 36.0–46.0)
Hemoglobin: 11.3 g/dL — ABNORMAL LOW (ref 12.0–15.0)
MCH: 26.7 pg (ref 26.0–34.0)
MCHC: 30.9 g/dL (ref 30.0–36.0)
MCV: 86.5 fL (ref 80.0–100.0)
Platelets: 298 10*3/uL (ref 150–400)
RBC: 4.23 MIL/uL (ref 3.87–5.11)
RDW: 15.4 % (ref 11.5–15.5)
WBC: 19.3 10*3/uL — ABNORMAL HIGH (ref 4.0–10.5)
nRBC: 0 % (ref 0.0–0.2)

## 2021-07-06 NOTE — ED Triage Notes (Addendum)
Pt BIB [facility] from home c/o SOB since Saturday, worsening today. Per EMS O2 sats 70% while wearing home CPAP (for OSA). Placed on EMS CPAP an O2 96%. Pt A&Ox4 HX- CHF

## 2021-07-07 ENCOUNTER — Encounter (HOSPITAL_COMMUNITY): Payer: Self-pay

## 2021-07-07 ENCOUNTER — Inpatient Hospital Stay (HOSPITAL_COMMUNITY): Payer: Medicare Other

## 2021-07-07 ENCOUNTER — Encounter (HOSPITAL_COMMUNITY)
Admission: EM | Disposition: A | Discharge status: Home Health | Payer: Self-pay | Source: Home / Self Care | Attending: Internal Medicine

## 2021-07-07 ENCOUNTER — Other Ambulatory Visit: Payer: Self-pay

## 2021-07-07 ENCOUNTER — Other Ambulatory Visit (HOSPITAL_COMMUNITY): Payer: Self-pay

## 2021-07-07 DIAGNOSIS — I5033 Acute on chronic diastolic (congestive) heart failure: Secondary | ICD-10-CM | POA: Diagnosis present

## 2021-07-07 DIAGNOSIS — I214 Non-ST elevation (NSTEMI) myocardial infarction: Secondary | ICD-10-CM | POA: Diagnosis present

## 2021-07-07 DIAGNOSIS — J181 Lobar pneumonia, unspecified organism: Secondary | ICD-10-CM | POA: Diagnosis present

## 2021-07-07 DIAGNOSIS — I25119 Atherosclerotic heart disease of native coronary artery with unspecified angina pectoris: Secondary | ICD-10-CM | POA: Diagnosis not present

## 2021-07-07 DIAGNOSIS — N189 Chronic kidney disease, unspecified: Secondary | ICD-10-CM | POA: Diagnosis not present

## 2021-07-07 DIAGNOSIS — I5023 Acute on chronic systolic (congestive) heart failure: Secondary | ICD-10-CM | POA: Diagnosis not present

## 2021-07-07 DIAGNOSIS — R9431 Abnormal electrocardiogram [ECG] [EKG]: Secondary | ICD-10-CM | POA: Diagnosis not present

## 2021-07-07 DIAGNOSIS — I5020 Unspecified systolic (congestive) heart failure: Secondary | ICD-10-CM | POA: Diagnosis not present

## 2021-07-07 DIAGNOSIS — I130 Hypertensive heart and chronic kidney disease with heart failure and stage 1 through stage 4 chronic kidney disease, or unspecified chronic kidney disease: Secondary | ICD-10-CM | POA: Diagnosis present

## 2021-07-07 DIAGNOSIS — N390 Urinary tract infection, site not specified: Secondary | ICD-10-CM | POA: Diagnosis present

## 2021-07-07 DIAGNOSIS — G4733 Obstructive sleep apnea (adult) (pediatric): Secondary | ICD-10-CM | POA: Diagnosis present

## 2021-07-07 DIAGNOSIS — I5021 Acute systolic (congestive) heart failure: Secondary | ICD-10-CM | POA: Diagnosis not present

## 2021-07-07 DIAGNOSIS — E876 Hypokalemia: Secondary | ICD-10-CM | POA: Diagnosis present

## 2021-07-07 DIAGNOSIS — I4891 Unspecified atrial fibrillation: Secondary | ICD-10-CM | POA: Diagnosis not present

## 2021-07-07 DIAGNOSIS — I110 Hypertensive heart disease with heart failure: Secondary | ICD-10-CM | POA: Diagnosis not present

## 2021-07-07 DIAGNOSIS — Z20822 Contact with and (suspected) exposure to covid-19: Secondary | ICD-10-CM | POA: Diagnosis present

## 2021-07-07 DIAGNOSIS — F32A Depression, unspecified: Secondary | ICD-10-CM | POA: Diagnosis present

## 2021-07-07 DIAGNOSIS — E1142 Type 2 diabetes mellitus with diabetic polyneuropathy: Secondary | ICD-10-CM | POA: Diagnosis present

## 2021-07-07 DIAGNOSIS — J9601 Acute respiratory failure with hypoxia: Secondary | ICD-10-CM | POA: Diagnosis present

## 2021-07-07 DIAGNOSIS — R0603 Acute respiratory distress: Secondary | ICD-10-CM | POA: Diagnosis not present

## 2021-07-07 DIAGNOSIS — N1831 Chronic kidney disease, stage 3a: Secondary | ICD-10-CM | POA: Diagnosis present

## 2021-07-07 DIAGNOSIS — R509 Fever, unspecified: Secondary | ICD-10-CM | POA: Diagnosis not present

## 2021-07-07 DIAGNOSIS — N289 Disorder of kidney and ureter, unspecified: Secondary | ICD-10-CM | POA: Diagnosis present

## 2021-07-07 DIAGNOSIS — I25110 Atherosclerotic heart disease of native coronary artery with unstable angina pectoris: Secondary | ICD-10-CM | POA: Diagnosis present

## 2021-07-07 DIAGNOSIS — E1151 Type 2 diabetes mellitus with diabetic peripheral angiopathy without gangrene: Secondary | ICD-10-CM | POA: Diagnosis present

## 2021-07-07 DIAGNOSIS — I480 Paroxysmal atrial fibrillation: Secondary | ICD-10-CM | POA: Diagnosis not present

## 2021-07-07 DIAGNOSIS — J811 Chronic pulmonary edema: Secondary | ICD-10-CM | POA: Diagnosis not present

## 2021-07-07 DIAGNOSIS — I083 Combined rheumatic disorders of mitral, aortic and tricuspid valves: Secondary | ICD-10-CM | POA: Diagnosis not present

## 2021-07-07 DIAGNOSIS — Z9989 Dependence on other enabling machines and devices: Secondary | ICD-10-CM | POA: Diagnosis not present

## 2021-07-07 DIAGNOSIS — E1169 Type 2 diabetes mellitus with other specified complication: Secondary | ICD-10-CM | POA: Diagnosis not present

## 2021-07-07 DIAGNOSIS — N179 Acute kidney failure, unspecified: Secondary | ICD-10-CM | POA: Diagnosis present

## 2021-07-07 DIAGNOSIS — N3000 Acute cystitis without hematuria: Secondary | ICD-10-CM | POA: Diagnosis not present

## 2021-07-07 DIAGNOSIS — K219 Gastro-esophageal reflux disease without esophagitis: Secondary | ICD-10-CM | POA: Diagnosis present

## 2021-07-07 DIAGNOSIS — I4819 Other persistent atrial fibrillation: Secondary | ICD-10-CM | POA: Diagnosis present

## 2021-07-07 DIAGNOSIS — H547 Unspecified visual loss: Secondary | ICD-10-CM | POA: Diagnosis present

## 2021-07-07 DIAGNOSIS — I428 Other cardiomyopathies: Secondary | ICD-10-CM | POA: Diagnosis present

## 2021-07-07 DIAGNOSIS — E785 Hyperlipidemia, unspecified: Secondary | ICD-10-CM | POA: Diagnosis not present

## 2021-07-07 DIAGNOSIS — E782 Mixed hyperlipidemia: Secondary | ICD-10-CM | POA: Diagnosis not present

## 2021-07-07 DIAGNOSIS — M1990 Unspecified osteoarthritis, unspecified site: Secondary | ICD-10-CM | POA: Diagnosis present

## 2021-07-07 DIAGNOSIS — I509 Heart failure, unspecified: Secondary | ICD-10-CM

## 2021-07-07 DIAGNOSIS — J810 Acute pulmonary edema: Secondary | ICD-10-CM | POA: Diagnosis not present

## 2021-07-07 DIAGNOSIS — J90 Pleural effusion, not elsewhere classified: Secondary | ICD-10-CM | POA: Diagnosis not present

## 2021-07-07 DIAGNOSIS — D649 Anemia, unspecified: Secondary | ICD-10-CM | POA: Diagnosis not present

## 2021-07-07 DIAGNOSIS — Z87891 Personal history of nicotine dependence: Secondary | ICD-10-CM | POA: Diagnosis not present

## 2021-07-07 DIAGNOSIS — R0602 Shortness of breath: Secondary | ICD-10-CM | POA: Diagnosis not present

## 2021-07-07 DIAGNOSIS — I69398 Other sequelae of cerebral infarction: Secondary | ICD-10-CM | POA: Diagnosis not present

## 2021-07-07 DIAGNOSIS — E669 Obesity, unspecified: Secondary | ICD-10-CM | POA: Diagnosis present

## 2021-07-07 DIAGNOSIS — E1122 Type 2 diabetes mellitus with diabetic chronic kidney disease: Secondary | ICD-10-CM | POA: Diagnosis present

## 2021-07-07 DIAGNOSIS — I10 Essential (primary) hypertension: Secondary | ICD-10-CM | POA: Diagnosis not present

## 2021-07-07 DIAGNOSIS — E1159 Type 2 diabetes mellitus with other circulatory complications: Secondary | ICD-10-CM | POA: Diagnosis not present

## 2021-07-07 DIAGNOSIS — I119 Hypertensive heart disease without heart failure: Secondary | ICD-10-CM | POA: Diagnosis not present

## 2021-07-07 LAB — COMPREHENSIVE METABOLIC PANEL
ALT: 24 U/L (ref 0–44)
AST: 44 U/L — ABNORMAL HIGH (ref 15–41)
Albumin: 3.1 g/dL — ABNORMAL LOW (ref 3.5–5.0)
Alkaline Phosphatase: 134 U/L — ABNORMAL HIGH (ref 38–126)
Anion gap: 11 (ref 5–15)
BUN: 15 mg/dL (ref 8–23)
CO2: 24 mmol/L (ref 22–32)
Calcium: 8.7 mg/dL — ABNORMAL LOW (ref 8.9–10.3)
Chloride: 100 mmol/L (ref 98–111)
Creatinine, Ser: 1.16 mg/dL — ABNORMAL HIGH (ref 0.44–1.00)
GFR, Estimated: 46 mL/min — ABNORMAL LOW (ref >60)
Glucose, Bld: 278 mg/dL — ABNORMAL HIGH (ref 70–99)
Potassium: 4.6 mmol/L (ref 3.5–5.1)
Sodium: 135 mmol/L (ref 135–145)
Total Bilirubin: 1.4 mg/dL — ABNORMAL HIGH (ref 0.3–1.2)
Total Protein: 6.9 g/dL (ref 6.5–8.1)

## 2021-07-07 LAB — ECHOCARDIOGRAM COMPLETE
Area-P 1/2: 3.74 cm2
Calc EF: 50.5 %
Height: 60 in
P 1/2 time: 408 msec
S' Lateral: 2.4 cm
Single Plane A2C EF: 46.3 %
Single Plane A4C EF: 54.5 %
Weight: 2640 oz

## 2021-07-07 LAB — URINALYSIS, ROUTINE W REFLEX MICROSCOPIC
Bacteria, UA: NONE SEEN
Bilirubin Urine: NEGATIVE
Glucose, UA: NEGATIVE mg/dL
Ketones, ur: NEGATIVE mg/dL
Leukocytes,Ua: NEGATIVE
Nitrite: NEGATIVE
Protein, ur: 30 mg/dL — AB
Specific Gravity, Urine: 1.008 (ref 1.005–1.030)
pH: 5 (ref 5.0–8.0)

## 2021-07-07 LAB — APTT
aPTT: 75 seconds — ABNORMAL HIGH (ref 24–36)
aPTT: 70 seconds — ABNORMAL HIGH (ref 24–36)

## 2021-07-07 LAB — MAGNESIUM: Magnesium: 1.8 mg/dL (ref 1.7–2.4)

## 2021-07-07 LAB — CBG MONITORING, ED
Glucose-Capillary: 201 mg/dL — ABNORMAL HIGH (ref 70–99)
Glucose-Capillary: 149 mg/dL — ABNORMAL HIGH (ref 70–99)

## 2021-07-07 LAB — TROPONIN I (HIGH SENSITIVITY)
Troponin I (High Sensitivity): 196 ng/L (ref <18)
Troponin I (High Sensitivity): 341 ng/L (ref <18)
Troponin I (High Sensitivity): 58 ng/L — ABNORMAL HIGH (ref <18)

## 2021-07-07 LAB — GLUCOSE, CAPILLARY
Glucose-Capillary: 129 mg/dL — ABNORMAL HIGH (ref 70–99)
Glucose-Capillary: 173 mg/dL — ABNORMAL HIGH (ref 70–99)

## 2021-07-07 LAB — RESP PANEL BY RT-PCR (FLU A&B, COVID) ARPGX2
Influenza A by PCR: NEGATIVE
Influenza B by PCR: NEGATIVE
SARS Coronavirus 2 by RT PCR: NEGATIVE

## 2021-07-07 LAB — HEMOGLOBIN A1C
Hgb A1c MFr Bld: 7.8 % — ABNORMAL HIGH (ref 4.8–5.6)
Mean Plasma Glucose: 177.16 mg/dL

## 2021-07-07 LAB — HEPARIN LEVEL (UNFRACTIONATED): Heparin Unfractionated: >1.1 IU/mL — ABNORMAL HIGH (ref 0.30–0.70)

## 2021-07-07 LAB — BRAIN NATRIURETIC PEPTIDE: B Natriuretic Peptide: 501.1 pg/mL — ABNORMAL HIGH (ref 0.0–100.0)

## 2021-07-07 SURGERY — LEFT HEART CATH AND CORONARY ANGIOGRAPHY
Anesthesia: LOCAL

## 2021-07-07 MED ORDER — PROCHLORPERAZINE EDISYLATE 10 MG/2ML IJ SOLN
10.0000 mg | Freq: Four times a day (QID) | INTRAMUSCULAR | Status: DC | PRN
  Administered 2021-07-07: 10 mg via INTRAVENOUS
  Filled 2021-07-07 (×2): qty 2

## 2021-07-07 MED ORDER — EZETIMIBE 10 MG PO TABS
10.0000 mg | ORAL_TABLET | Freq: Every day | ORAL | Status: DC
  Administered 2021-07-07 – 2021-07-13 (×7): 10 mg via ORAL
  Filled 2021-07-07 (×7): qty 1

## 2021-07-07 MED ORDER — PANTOPRAZOLE SODIUM 40 MG PO TBEC
40.0000 mg | DELAYED_RELEASE_TABLET | Freq: Every day | ORAL | Status: DC
  Administered 2021-07-07 – 2021-07-13 (×7): 40 mg via ORAL
  Filled 2021-07-07 (×7): qty 1

## 2021-07-07 MED ORDER — GABAPENTIN 300 MG PO CAPS
300.0000 mg | ORAL_CAPSULE | Freq: Two times a day (BID) | ORAL | Status: DC
  Administered 2021-07-07 – 2021-07-13 (×12): 300 mg via ORAL
  Filled 2021-07-07 (×12): qty 1

## 2021-07-07 MED ORDER — INSULIN ASPART 100 UNIT/ML IJ SOLN
0.0000 [IU] | Freq: Every day | INTRAMUSCULAR | Status: DC
  Administered 2021-07-08 – 2021-07-11 (×2): 3 [IU] via SUBCUTANEOUS
  Administered 2021-07-12: 2 [IU] via SUBCUTANEOUS

## 2021-07-07 MED ORDER — HYDROXYZINE HCL 25 MG PO TABS: 25.0000 mg | ORAL_TABLET | ORAL | Status: DC

## 2021-07-07 MED ORDER — VIBEGRON 75 MG PO TABS: 75.0000 mg | ORAL_TABLET | Freq: Every day | ORAL | Status: DC

## 2021-07-07 MED ORDER — NITROGLYCERIN 0.4 MG SL SUBL
0.4000 mg | SUBLINGUAL_TABLET | SUBLINGUAL | Status: AC
  Administered 2021-07-07 (×2): 0.4 mg via SUBLINGUAL
  Filled 2021-07-07: qty 1

## 2021-07-07 MED ORDER — FUROSEMIDE 10 MG/ML IJ SOLN
40.0000 mg | Freq: Once | INTRAMUSCULAR | Status: AC
  Administered 2021-07-07: 40 mg via INTRAVENOUS
  Filled 2021-07-07: qty 4

## 2021-07-07 MED ORDER — PROPOFOL 1000 MG/100ML IV EMUL
INTRAVENOUS | Status: AC
  Filled 2021-07-07: qty 100

## 2021-07-07 MED ORDER — HEPARIN (PORCINE) 25000 UT/250ML-% IV SOLN
1100.0000 [IU]/h | INTRAVENOUS | Status: DC
  Administered 2021-07-07 – 2021-07-08 (×2): 1000 [IU]/h via INTRAVENOUS
  Filled 2021-07-07 (×3): qty 250

## 2021-07-07 MED ORDER — INSULIN ASPART 100 UNIT/ML IJ SOLN
0.0000 [IU] | Freq: Three times a day (TID) | INTRAMUSCULAR | Status: DC
  Administered 2021-07-07 (×2): 1 [IU] via SUBCUTANEOUS
  Administered 2021-07-07: 3 [IU] via SUBCUTANEOUS
  Administered 2021-07-09 (×2): 5 [IU] via SUBCUTANEOUS
  Administered 2021-07-09 – 2021-07-10 (×2): 2 [IU] via SUBCUTANEOUS
  Administered 2021-07-10: 1 [IU] via SUBCUTANEOUS
  Administered 2021-07-10: 3 [IU] via SUBCUTANEOUS
  Administered 2021-07-11: 2 [IU] via SUBCUTANEOUS
  Administered 2021-07-11: 3 [IU] via SUBCUTANEOUS
  Administered 2021-07-11 – 2021-07-12 (×3): 2 [IU] via SUBCUTANEOUS
  Administered 2021-07-13: 3 [IU] via SUBCUTANEOUS
  Administered 2021-07-13: 2 [IU] via SUBCUTANEOUS

## 2021-07-07 MED ORDER — SODIUM CHLORIDE 0.9% FLUSH: 3.0000 mL | INTRAVENOUS | Status: DC | PRN

## 2021-07-07 MED ORDER — FERROUS SULFATE 325 (65 FE) MG PO TABS
325.0000 mg | ORAL_TABLET | Freq: Every day | ORAL | Status: DC
  Administered 2021-07-07 – 2021-07-13 (×6): 325 mg via ORAL
  Filled 2021-07-07 (×6): qty 1

## 2021-07-07 MED ORDER — METOPROLOL SUCCINATE ER 25 MG PO TB24
12.5000 mg | ORAL_TABLET | Freq: Two times a day (BID) | ORAL | Status: DC
  Administered 2021-07-07 – 2021-07-13 (×11): 12.5 mg via ORAL
  Filled 2021-07-07 (×12): qty 1

## 2021-07-07 MED ORDER — FESOTERODINE FUMARATE ER 8 MG PO TB24
8.0000 mg | ORAL_TABLET | Freq: Every evening | ORAL | Status: DC
  Administered 2021-07-07 – 2021-07-12 (×6): 8 mg via ORAL
  Filled 2021-07-07 (×7): qty 1

## 2021-07-07 MED ORDER — LORAZEPAM 2 MG/ML IJ SOLN
0.5000 mg | Freq: Once | INTRAMUSCULAR | Status: DC | PRN
Start: 2021-07-07 — End: 2021-07-07

## 2021-07-07 MED ORDER — HYDROXYZINE HCL 25 MG PO TABS
50.0000 mg | ORAL_TABLET | Freq: Every day | ORAL | Status: DC
  Administered 2021-07-07 – 2021-07-12 (×6): 50 mg via ORAL
  Filled 2021-07-07 (×6): qty 2

## 2021-07-07 MED ORDER — FUROSEMIDE 10 MG/ML IJ SOLN
40.0000 mg | Freq: Two times a day (BID) | INTRAMUSCULAR | Status: DC
  Administered 2021-07-07 – 2021-07-08 (×3): 40 mg via INTRAVENOUS
  Filled 2021-07-07 (×3): qty 4

## 2021-07-07 MED ORDER — SODIUM CHLORIDE 0.9 % IV SOLN: INTRAVENOUS | Status: DC

## 2021-07-07 MED ORDER — SODIUM CHLORIDE 0.9% FLUSH
3.0000 mL | Freq: Two times a day (BID) | INTRAVENOUS | Status: DC
  Administered 2021-07-07 – 2021-07-13 (×10): 3 mL via INTRAVENOUS

## 2021-07-07 MED ORDER — FUROSEMIDE 10 MG/ML IJ SOLN: 20.0000 mg | Freq: Two times a day (BID) | INTRAMUSCULAR | Status: DC

## 2021-07-07 MED ORDER — SODIUM CHLORIDE 0.9 % IV SOLN: 250.0000 mL | INTRAVENOUS | Status: DC | PRN

## 2021-07-07 MED ORDER — INSULIN GLARGINE-YFGN 100 UNIT/ML ~~LOC~~ SOLN
10.0000 [IU] | Freq: Every day | SUBCUTANEOUS | Status: DC
  Administered 2021-07-07 – 2021-07-09 (×3): 10 [IU] via SUBCUTANEOUS
  Filled 2021-07-07 (×3): qty 0.1

## 2021-07-07 MED ORDER — CLOPIDOGREL BISULFATE 75 MG PO TABS
75.0000 mg | ORAL_TABLET | Freq: Every day | ORAL | Status: DC
  Administered 2021-07-07 – 2021-07-13 (×7): 75 mg via ORAL
  Filled 2021-07-07 (×7): qty 1

## 2021-07-07 MED ORDER — INSULIN ASPART 100 UNIT/ML IJ SOLN: 0.0000 [IU] | Freq: Three times a day (TID) | INTRAMUSCULAR | Status: DC

## 2021-07-07 MED ORDER — LORATADINE 10 MG PO TABS: 10.0000 mg | ORAL_TABLET | Freq: Every day | ORAL | Status: DC | PRN

## 2021-07-07 MED ORDER — ACETAMINOPHEN 500 MG PO TABS
1000.0000 mg | ORAL_TABLET | Freq: Four times a day (QID) | ORAL | Status: DC | PRN
  Administered 2021-07-11 – 2021-07-12 (×2): 1000 mg via ORAL
  Filled 2021-07-07 (×3): qty 2

## 2021-07-07 MED ORDER — MEMANTINE HCL-DONEPEZIL HCL ER 28-10 MG PO CP24: 1.0000 | ORAL_CAPSULE | Freq: Every day | ORAL | Status: DC

## 2021-07-07 MED ORDER — ASPIRIN 81 MG PO CHEW: 81.0000 mg | CHEWABLE_TABLET | ORAL | Status: AC

## 2021-07-07 MED ORDER — NITROFURANTOIN MONOHYD MACRO 100 MG PO CAPS
100.0000 mg | ORAL_CAPSULE | Freq: Two times a day (BID) | ORAL | Status: DC
  Administered 2021-07-07 – 2021-07-08 (×2): 100 mg via ORAL
  Filled 2021-07-07 (×4): qty 1

## 2021-07-07 MED ORDER — HYDROXYZINE HCL 25 MG PO TABS
25.0000 mg | ORAL_TABLET | Freq: Every day | ORAL | Status: DC
  Administered 2021-07-08 – 2021-07-13 (×6): 25 mg via ORAL
  Filled 2021-07-07 (×6): qty 1

## 2021-07-07 MED ORDER — ROSUVASTATIN CALCIUM 5 MG PO TABS
10.0000 mg | ORAL_TABLET | Freq: Every day | ORAL | Status: DC
  Administered 2021-07-07 – 2021-07-12 (×6): 10 mg via ORAL
  Filled 2021-07-07 (×6): qty 2

## 2021-07-07 MED ORDER — CYCLOBENZAPRINE HCL 5 MG PO TABS
5.0000 mg | ORAL_TABLET | Freq: Once | ORAL | Status: AC
  Administered 2021-07-07: 5 mg via ORAL
  Filled 2021-07-07: qty 1

## 2021-07-07 MED ORDER — ISOSORBIDE DINITRATE 10 MG PO TABS
30.0000 mg | ORAL_TABLET | Freq: Two times a day (BID) | ORAL | Status: DC
  Administered 2021-07-07 – 2021-07-13 (×12): 30 mg via ORAL
  Filled 2021-07-07 (×11): qty 3

## 2021-07-07 MED ORDER — POTASSIUM CHLORIDE ER 10 MEQ PO TBCR
10.0000 meq | EXTENDED_RELEASE_TABLET | Freq: Two times a day (BID) | ORAL | Status: DC
  Filled 2021-07-07: qty 1

## 2021-07-07 MED ORDER — DULOXETINE HCL 60 MG PO CPEP
60.0000 mg | ORAL_CAPSULE | Freq: Every day | ORAL | Status: DC
  Administered 2021-07-07 – 2021-07-13 (×7): 60 mg via ORAL
  Filled 2021-07-07 (×7): qty 1

## 2021-07-07 MED ORDER — FLUTICASONE PROPIONATE 50 MCG/ACT NA SUSP: 1.0000 | Freq: Two times a day (BID) | NASAL | Status: DC | PRN

## 2021-07-07 MED ORDER — LINAGLIPTIN 5 MG PO TABS
5.0000 mg | ORAL_TABLET | Freq: Every day | ORAL | Status: DC
  Administered 2021-07-07 – 2021-07-13 (×7): 5 mg via ORAL
  Filled 2021-07-07 (×7): qty 1

## 2021-07-07 NOTE — Progress Notes (Signed)
Heart Failure Navigator Progress Note  Assessed for Heart & Vascular TOC clinic readiness.  Patient does not meet criteria due to [facility] patient..     Garlin Batdorf, BSN, RN Heart Failure Nurse Navigator Secure Chat Only   

## 2021-07-07 NOTE — Progress Notes (Signed)
ANTICOAGULATION CONSULT NOTE - Initial Consult  Pharmacy Consult for heparin Indication: chest pain/ACS and atrial fibrillation  Allergies  Allergen Reactions   Codeine     vomiting   Metformin     Other reaction(s): nausea, vomitting and diarrhea   Oxycodone-Acetaminophen     Other reaction(s): Unknown   Percocet [Oxycodone-Acetaminophen] Nausea And Vomiting   Tape     Other reaction(s): Unknown   Tolterodine Other (See Comments)   Latex Itching and Rash    EKG leads    Patient Measurements: Height: 5' (152.4 cm) Weight: 74.8 kg (165 lb) IBW/kg (Calculated) : 45.5 Heparin Dosing Weight: 65kg  Vital Signs: Temp: 98.4 F (36.9 C) (06/27 2333) BP: 149/56 (06/28 0415) Pulse Rate: 88 (06/28 0415)  Labs: Recent Labs    07/06/21 2330 07/07/21 0152  HGB 11.3*  --   HCT 36.6  --   PLT 298  --   CREATININE 1.16*  --   TROPONINIHS 58* 196*    Estimated Creatinine Clearance: 31.4 mL/min (A) (by C-G formula based on SCr of 1.16 mg/dL (H)).   Medical History: Past Medical History:  Diagnosis Date   Anginal pain ([place])    occ   Anxiety    Broken ankle    Bullous pemphigus    CAD (coronary artery disease)    [place]   80% stenosis ostial Left main, 30% stenosis proximal LAD, 80% stenosis proximal RCA CABG  -  LIMA to LAD, SVG to int, SVG to [place], [place]    Depression    Diabetic peripheral neuropathy ([place])    Encounter for loop recorder check 04/11/2019   GERD (gastroesophageal reflux disease)    H/O: stroke: Left occipital stroke and vision loss 08/24/2018   Headache(784.0)    hx migraines   Heart murmur    mvp   Hyperlipidemia    Hypertension    Hypertensive heart disease    Loop recorder Reveal 08/24/2018 04/11/2019   Scheduled Remote loop recorder check 03/10/2019: The presenting rhythm is sinus bradycardia. There were 0 symptomatic patient activations recorded. There were 275 AF episodes and 0 AT episodes recorded, representing a 4.2 %  cumulative AT/AF burden. The longest episode lasted 00:03:40:00 in duration. Some false with PAC. There were 0 tachy episodes detected. There were 0 pause episodes detected. Th   Nasal bleeding    occ due to O2   OA (osteoarthritis)    Obesity (BMI 30-39.9)    Obstructive sleep apnea on CPAP    Osteopenia    Paroxysmal atrial fibrillation ([place]) 04/17/2019   Peripheral neuropathy    Seizure ([place])    2009   Sleep apnea    dx 27yr ago doesnt use cpap but uses O2 at nite 2L   Stones in the urinary tract    Type 2 diabetes mellitus with vascular disease ([place]    UTI (lower urinary tract infection)     Assessment: 89yofemale c/o inc'd WOB w/ worsening SOB since Sat, troponin elevated and rising, on Eliquis PTA for Afib (last dose 6/26 am), to transition to heparin.  Goal of Therapy:  Heparin level 0.3-0.7 units/ml aPTT 66-102 seconds Monitor platelets by anticoagulation protocol: Yes   Plan:  Heparin 1000 units/hr without bolus given recent Eliquis. Heparin levels, aPTT (while Eliquis affects anti-Xa assay), and CBC.  VWynona Neat PharmD, BCPS  07/07/2021,4:35 AM

## 2021-07-07 NOTE — Progress Notes (Signed)
Pt transported from ED32 to 3E27 on BIPAP. RN and RTx2 at bedside. Pt tolerated well with no apparent complications. RT will continue to monitor.

## 2021-07-07 NOTE — ED Notes (Signed)
Pt's work of breathing increasing and audible wheezing noted with respirations in lowers 40s. This RN asked pt if she feels the CPAP would help and she agreed. RT notified and now at bedside. CPAP on pt at time, respiration 20s.

## 2021-07-07 NOTE — ED Provider Notes (Signed)
[facility] EMERGENCY DEPARTMENT Provider Note   CSN: [id number] Arrival date & time: 07/06/21  2325     History  Chief Complaint  Patient presents with   Respiratory Distress    PRESLEA RHODUS is a 88 y.o. female.  88 year old female who presents with EMS secondary to dyspnea and respiratory distress.  Patient has a history of cardiac disease recent with a stent.  No chest pain right now but she had progressive worsening dyspnea so EMS was called.  She was hypoxic with them and had increased labored breathing so they started her on BiPAP.  Nitroglycerin given as well.  On arrival here patient states he is feeling a bit better and was transitioned to oxygen via nasal cannula.        Home Medications Prior to Admission medications   Medication Sig Start Date End Date Taking? Authorizing Provider  acetaminophen (TYLENOL) 500 MG tablet Take 1,000 mg by mouth every 4 (four) hours as needed for moderate pain or headache.   Yes [provider]  apixaban (ELIQUIS) 5 MG TABS tablet Take 1 tablet (5 mg total) by mouth 2 (two) times daily. Will need to see MD for future refills. 08/22/19  Yes Camnitz, Ocie Doyne, MD  B-D UF III MINI PEN NEEDLES 31G X 5 MM MISC SMARTSIG:1 Each SUB-Q Daily 03/14/19  Yes [provider]  clopidogrel (PLAVIX) 75 MG tablet Take 1 tablet (75 mg total) by mouth daily. 03/11/21  Yes Cantwell, Celeste C, PA-C  DULoxetine (CYMBALTA) 60 MG capsule Take 60 mg by mouth daily. 10/16/17  Yes [provider]  ezetimibe (ZETIA) 10 MG tablet Take 10 mg by mouth daily. 09/17/19  Yes [provider]  Ferrous Sulfate Dried (HIGH POTENCY IRON) 65 MG TABS Take 65 mg by mouth daily.   Yes [provider]  fesoterodine (TOVIAZ) 8 MG TB24 tablet Take 8 mg by mouth every evening.    Yes [provider]  fluticasone (FLONASE) 50 MCG/ACT nasal spray Place 1 spray into both nostrils 2 (two) times daily as needed for allergies  or rhinitis.   Yes [provider]  FREESTYLE LITE test strip Check blood sugar every morning 04/09/19  Yes [provider]  furosemide (LASIX) 20 MG tablet TAKE 1 TABLET DAILY Patient taking differently: Take 20 mg by mouth daily. 09/01/20  Yes Adrian Prows, MD  gabapentin (NEURONTIN) 300 MG capsule Take 300 mg by mouth 2 (two) times daily.   Yes [provider]  glimepiride (AMARYL) 1 MG tablet Take 1 mg by mouth daily.   Yes [provider]  hydrOXYzine (ATARAX/VISTARIL) 25 MG tablet Take 25-50 mg by mouth See admin instructions. 25 mg in the morning 50 mg at bedtime   Yes [provider]  Insulin Glargine (LANTUS SOLOSTAR) 100 UNIT/ML Solostar Pen Inject 25 Units into the skin daily before breakfast.   Yes [provider]  isosorbide dinitrate (ISORDIL) 30 MG tablet TAKE 1 TABLET THREE TIMES A DAY Patient taking differently: Take 30 mg by mouth 2 (two) times daily. 07/06/21  Yes Adrian Prows, MD  loperamide (IMODIUM A-D) 2 MG tablet Take 2 mg by mouth 4 (four) times daily as needed for diarrhea or loose stools.   Yes [provider]  loratadine (CLARITIN) 10 MG tablet Take 10 mg by mouth daily as needed (allergies).   Yes [provider]  metoprolol succinate (TOPROL-XL) 25 MG 24 hr tablet Take 12.5 mg by mouth 2 (two) times  daily.   Yes [provider]  NAMZARIC 28-10 MG CP24 Take 1 capsule by mouth daily. Patient taking differently: Take 1 capsule by mouth at bedtime. 06/09/21  Yes Melvenia Beam, MD  nitrofurantoin, macrocrystal-monohydrate, (MACROBID) 100 MG capsule Take 100 mg by mouth See admin instructions. Bid x 7 days 07/05/21  Yes [provider]  nitroGLYCERIN (NITROSTAT) 0.4 MG SL tablet Place 1 tablet (0.4 mg total) under the tongue every 5 (five) minutes as needed for up to 25 days for chest pain. Patient taking differently: Place 0.4 mg under the tongue every 5 (five) minutes as needed for chest  pain. 05/29/20 07/07/21 Yes Adrian Prows, MD  NON FORMULARY CPAP at bedtime   Yes [provider]  omeprazole (PRILOSEC) 20 MG capsule Take 20 mg by mouth every morning. 06/24/21  Yes [provider]  pantoprazole (PROTONIX) 40 MG tablet Take 40 mg by mouth daily as needed (heartburn). 10/31/17  Yes [provider]  potassium chloride SA (KLOR-CON M) 20 MEQ tablet TAKE 1 TABLET DAILY Patient taking differently: Take 20 mEq by mouth daily. 01/29/21  Yes Adrian Prows, MD  rosuvastatin (CRESTOR) 10 MG tablet Take 10 mg by mouth daily before supper.   Yes [provider]  sitaGLIPtin (JANUVIA) 100 MG tablet Take 100 mg by mouth daily.   Yes [provider]  Vibegron (GEMTESA) 75 MG TABS Take 75 mg by mouth daily.   Yes [provider]  vitamin B-12 (CYANOCOBALAMIN) 1000 MCG tablet Take 1,000 mcg by mouth at bedtime.   Yes [provider]  cyclopentolate (CYCLOGYL) 1 % ophthalmic solution Place 1 drop into the left eye 2 (two) times daily. Patient not taking: Reported on 07/07/2021 04/13/21 04/14/22  Rankin, Clent Demark, MD      Allergies    Codeine, Metformin, Oxycodone-acetaminophen, Percocet [oxycodone-acetaminophen], Tape, Tolterodine, and Latex    Review of Systems   Review of Systems  Physical Exam Updated Vital Signs BP 129/75   Pulse 92   Temp 98.4 F (36.9 C)   Resp (!) 22   Ht 5' (1.524 m)   Wt 74.8 kg   SpO2 99%   BMI 32.22 kg/m  Physical Exam Vitals and nursing note reviewed.  Constitutional:      Appearance: She is well-developed.  HENT:     Head: Normocephalic and atraumatic.     Nose: No congestion or rhinorrhea.     Mouth/Throat:     Mouth: Mucous membranes are moist.     Pharynx: Oropharynx is clear.  Eyes:     Pupils: Pupils are equal, round, and reactive to light.  Cardiovascular:     Rate and Rhythm: Regular rhythm. Tachycardia present.  Pulmonary:     Effort: No respiratory distress.     Breath sounds: No  stridor. Wheezing and rales present.  Abdominal:     General: Abdomen is flat. There is no distension.  Musculoskeletal:     Cervical back: Normal range of motion.     Right lower leg: Edema present.     Left lower leg: Edema present.  Skin:    General: Skin is warm and dry.  Neurological:     General: No focal deficit present.     Mental Status: She is alert.     ED Results / Procedures / Treatments   Labs (all labs ordered are listed, but only abnormal results are displayed) Labs Reviewed  COMPREHENSIVE METABOLIC PANEL - Abnormal; Notable for the following components:  Result Value   Glucose, Bld 278 (*)    Creatinine, Ser 1.16 (*)    Calcium 8.7 (*)    Albumin 3.1 (*)    AST 44 (*)    Alkaline Phosphatase 134 (*)    Total Bilirubin 1.4 (*)    GFR, Estimated 46 (*)    All other components within normal limits  CBC - Abnormal; Notable for the following components:   WBC 19.3 (*)    Hemoglobin 11.3 (*)    All other components within normal limits  BRAIN NATRIURETIC PEPTIDE - Abnormal; Notable for the following components:   B Natriuretic Peptide 501.1 (*)    All other components within normal limits  TROPONIN I (HIGH SENSITIVITY) - Abnormal; Notable for the following components:   Troponin I (High Sensitivity) 58 (*)    All other components within normal limits  TROPONIN I (HIGH SENSITIVITY) - Abnormal; Notable for the following components:   Troponin I (High Sensitivity) 196 (*)    All other components within normal limits  RESP PANEL BY RT-PCR (FLU A&B, COVID) [id number]    EKG None  Radiology DG Chest Port 1 View  Result Date: 07/07/2021 CLINICAL DATA:  Shortness of breath. EXAM: PORTABLE CHEST 1 VIEW COMPARISON:  10/07/2019 FINDINGS: Patient is post median sternotomy. Planted loop recorder in the left chest wall. Mild cardiomegaly with unchanged mediastinal contours. Moderate pulmonary edema. Small pleural effusions and fluid in the fissure. No pneumothorax.  IMPRESSION: CHF with moderate pulmonary edema and small pleural effusions. Electronically Signed   By: Keith Rake M.D.   On: 07/07/2021 00:10    Procedures .Critical Care  Performed by: Merrily Pew, MD Authorized by: Merrily Pew, MD   Critical care provider statement:    Critical care time (minutes):  30   Critical care was necessary to treat or prevent imminent or life-threatening deterioration of the following conditions:  Respiratory failure   Critical care was time spent personally by me on the following activities:  Development of treatment plan with patient or surrogate, discussions with consultants, evaluation of patient's response to treatment, examination of patient, ordering and review of laboratory studies, ordering and review of radiographic studies, ordering and performing treatments and interventions, pulse oximetry, re-evaluation of patient's condition and review of old charts     Medications Ordered in ED Medications  nitroGLYCERIN (NITROSTAT) SL tablet 0.4 mg (0.4 mg Sublingual Not Given 07/07/21 0137)  furosemide (LASIX) injection 40 mg (40 mg Intravenous Given 07/07/21 0013)    ED Course/ Medical Decision Making/ A&P                           Medical Decision Making Amount and/or Complexity of Data Reviewed Labs: ordered. Radiology: ordered.  Risk Prescription drug management. Decision regarding hospitalization.   Hypoxic respiratory failure likely related to CHF.  X-ray reviewed by myself and does appear to have pulmonary edema Lasix ordered.  Blood pressures not really high enough to tolerate a nitroglycerin drip.  Trialed off of BiPAP and had increased work of breathing again so was put back on.  Has not really diuresed very much.  Discussed with Dr. Myna Hidalgo for admission.  Also her troponin went from 58-1 96 so discussed with Dr. Einar Gip with cardiology who will consult and does recommend heparin and will see/follow in hospital.    Final Clinical  Impression(s) / ED Diagnoses Final diagnoses:  Acute respiratory failure with hypoxia ([place])    Rx / DC  Orders ED Discharge Orders     None         Djeneba Barsch, Corene Cornea, MD 07/07/21 [id number]

## 2021-07-07 NOTE — Consult Note (Signed)
CARDIOLOGY CONSULT NOTE  Patient ID: Linda Oliver MRN: [id number] DOB/AGE: Aug 20, 1932 88 y.o.  Admit date: 07/06/2021 Referring Physician  Wynetta Fines, MD Primary Physician:  Shon Baton, MD Reason for Consultation  NSTEMI, CHF  Patient ID: Linda Oliver, female    DOB: 1932/01/18, 88 y.o.   MRN: [id number]  Chief Complaint  Patient presents with   Respiratory Distress   HPI:    Linda Oliver  is a 88 y.o. Caucasian female patient with CAD s/p CABG x 3 in 1996 in [place], HTN, hyperlipidemia, diabetes with PVD and stage 3a CKD and sleep apnea on CPAP and now compliant, carotid stenosis, and history of  autonomic orthostatic hypotension, CVA with medial left occipital infarct in embolic pattern by MRI in 2020 and loop recorder implanted showed paroxysmal atrial fibrillation.  Admitted with unstable angina and underwent coronary angiography on 03/05/2021 and overlapping stent plantation to the right coronary artery native as the SVG to RCA was functionally occluded.  Left grafts are widely patent.  I seen her on 06/17/2021 and she was doing remarkably well.  Over the weekend, patient spent time with her family, had gone out for dinner, but Monday morning she felt poorly but still was hanging out with her family.  But since then started feeling worse, complaining of dyspnea, fatigue and not feeling well.  Denied any chest pain.  Patient's daughter visited her on Tuesday and she was in acute respiratory distress and activated EMS, patient was treated with BiPAP and stabilized and I was called for consultation.  Patient is feeling very fatigued and tired and is not getting much history but is in mild respiratory distress.  Answers appropriately, denies chest pain, states that she has shortness of breath and fatigue and does not feel well.  Otherwise no other complaints, no palpitations, no dizziness at home and has had no syncope.  Past Medical History:  Diagnosis Date   Anginal pain ([place])    occ   Anxiety     Broken ankle    Bullous pemphigus    CAD (coronary artery disease)    [place]   80% stenosis ostial Left main, 30% stenosis proximal LAD, 80% stenosis proximal RCA CABG  -  LIMA to LAD, SVG to int, SVG to Garza-Salinas II, [place]    Depression    Diabetic peripheral neuropathy ([place])    Encounter for loop recorder check 04/11/2019   GERD (gastroesophageal reflux disease)    H/O: stroke: Left occipital stroke and vision loss 08/24/2018   Headache(784.0)    hx migraines   Heart murmur    mvp   Hyperlipidemia    Hypertension    Hypertensive heart disease    Loop recorder Reveal 08/24/2018 04/11/2019   Scheduled Remote loop recorder check 03/10/2019: The presenting rhythm is sinus bradycardia. There were 0 symptomatic patient activations recorded. There were 275 AF episodes and 0 AT episodes recorded, representing a 4.2 % cumulative AT/AF burden. The longest episode lasted 00:03:40:00 in duration. Some false with PAC. There were 0 tachy episodes detected. There were 0 pause episodes detected. Th   Nasal bleeding    occ due to O2   OA (osteoarthritis)    Obesity (BMI 30-39.9)    Obstructive sleep apnea on CPAP    Osteopenia    Paroxysmal atrial fibrillation ([place]) 04/17/2019   Peripheral neuropathy    Seizure ([place])    2009   Sleep apnea    dx 56yr ago doesnt  use cpap but uses O2 at nite 2L   Stones in the urinary tract    Type 2 diabetes mellitus with vascular disease ([place])    UTI (lower urinary tract infection)    Past Surgical History:  Procedure Laterality Date   ABDOMINAL HYSTERECTOMY  70's   CARDIAC CATHETERIZATION     CARPAL TUNNEL RELEASE Bilateral    CATARACT EXTRACTION W/PHACO Bilateral    CHOLECYSTECTOMY     CORONARY ANGIOPLASTY WITH STENT PLACEMENT  2023   CORONARY ARTERY BYPASS GRAFT  1996   EYE SURGERY Left    LOOP RECORDER INSERTION N/A 08/24/2018   Procedure: LOOP RECORDER INSERTION;  Surgeon: Constance Haw, MD;  Location: [facility];   Service: Cardiovascular;  Laterality: N/A;   SHOULDER ARTHROSCOPY     rt   SHOULDER ARTHROSCOPY WITH ROTATOR CUFF REPAIR AND SUBACROMIAL DECOMPRESSION  12/01/2011   Procedure: SHOULDER ARTHROSCOPY WITH ROTATOR CUFF REPAIR AND SUBACROMIAL DECOMPRESSION;  Surgeon: Nita Sells, MD;  Location: [facility];  Service: Orthopedics;  Laterality: Left;  shoulder debridement calcific tendonitis   Social History   Tobacco Use   Smoking status: Former    Packs/day: 1.00    Years: 30.00    Total pack years: 30.00    Types: Cigarettes    Quit date: 11/25/1986    Years since quitting: 34.6   Smokeless tobacco: Never  Substance Use Topics   Alcohol use: No    Family History  Problem Relation Age of Onset   Coronary artery disease Mother    Heart disease Father    Cancer Father        skin   Coronary artery disease Father    Cancer Brother    Cancer - Colon Brother        brain, Lymphoma    Marital Status: Widowed  ROS  Review of Systems  Constitutional: Positive for malaise/fatigue.  Cardiovascular:  Positive for dyspnea on exertion. Negative for chest pain and leg swelling.   Objective      07/07/2021    8:45 AM 07/07/2021    8:30 AM 07/07/2021    8:15 AM  Vitals with BMI  Systolic 287 867 672  Diastolic 57 93 66  Pulse 79 30 78    Blood pressure (!) 127/57, pulse 79, temperature 98.4 F (36.9 C), resp. rate (!) 22, height 5' (1.524 m), weight 74.8 kg, SpO2 100 %.  Physical Exam Neck:     Vascular: No JVD.  Cardiovascular:     Rate and Rhythm: Normal rate. Rhythm irregular.     Pulses: Normal pulses and intact distal pulses.          Carotid pulses are  on the right side with bruit and  on the left side with bruit.    Heart sounds: S1 normal and S2 normal. Murmur heard.     Early systolic murmur is present with a grade of 2/6 at the upper right sternal border.     No gallop. No S3 or S4 sounds.  Pulmonary:     Effort: Pulmonary effort is normal.     Breath sounds:  Examination of the right-middle field reveals rhonchi. Examination of the left-middle field reveals rhonchi. Examination of the right-lower field reveals decreased breath sounds and rhonchi. Examination of the left-lower field reveals decreased breath sounds and rhonchi. Decreased breath sounds and rhonchi present.  Abdominal:     General: Bowel sounds are normal.     Palpations: Abdomen is soft.  Musculoskeletal:     Right lower leg: No edema.     Left lower leg: No edema.  Skin:    Capillary Refill: Capillary refill takes less than 2 seconds.    Laboratory examination:   Recent Labs    07/06/21 2330  NA 135  K 4.6  CL 100  CO2 24  GLUCOSE 278*  BUN 15  CREATININE 1.16*  CALCIUM 8.7*  GFRNONAA 46*   estimated creatinine clearance is 31.4 mL/min (A) (by C-G formula based on SCr of 1.16 mg/dL (H)).     Latest Ref Rng & Units 07/06/2021   11:30 PM 10/07/2019    8:12 PM 05/10/2019    2:42 PM  CMP  Glucose 70 - 99 mg/dL 278  251  168   BUN 8 - 23 mg/dL '15  20  13   '$ Creatinine 0.44 - 1.00 mg/dL 1.16  1.33  1.10   Sodium 135 - 145 mmol/L 135  137  140   Potassium 3.5 - 5.1 mmol/L 4.6  4.3  3.4   Chloride 98 - 111 mmol/L 100  103  99   CO2 22 - 32 mmol/L '24  24  27   '$ Calcium 8.9 - 10.3 mg/dL 8.7  8.5  8.5   Total Protein 6.5 - 8.1 g/dL 6.9     Total Bilirubin 0.3 - 1.2 mg/dL 1.4     Alkaline Phos 38 - 126 U/L 134     AST 15 - 41 U/L 44     ALT 0 - 44 U/L 24         Latest Ref Rng & Units 07/06/2021   11:30 PM 10/07/2019    8:12 PM 10/04/2018   10:46 AM  CBC  WBC 4.0 - 10.5 K/uL 19.3  7.2  9.3   Hemoglobin 12.0 - 15.0 g/dL 11.3  10.6  11.2   Hematocrit 36.0 - 46.0 % 36.6  34.6  37.5   Platelets 150 - 400 K/uL 298  240  279    Lipid Panel No results for input(s): "CHOL", "TRIG", "[place]", "VLDL", "HDL", "CHOLHDL", "LDLDIRECT" in the last 8760 hours.  HEMOGLOBIN A1C Lab Results  Component Value Date   HGBA1C 8.7 (H) 08/23/2018   MPG 202.99 08/23/2018   TSH No  results for input(s): "TSH" in the last 8760 hours. BNP (last 3 results) Recent Labs    07/06/21 2330  BNP 501.1*    Cardiac Panel (last 3 results) Recent Labs    07/06/21 2330 07/07/21 0152 07/07/21 0525  TROPONINIHS 58* 196* 341*     Medications and allergies   Allergies  Allergen Reactions   Codeine     vomiting   Metformin     Other reaction(s): nausea, vomitting and diarrhea   Oxycodone-Acetaminophen     Other reaction(s): Unknown   Percocet [Oxycodone-Acetaminophen] Nausea And Vomiting   Tape     Other reaction(s): Unknown   Tolterodine Other (See Comments)   Latex Itching and Rash    EKG leads     Current Meds  Medication Sig   acetaminophen (TYLENOL) 500 MG tablet Take 1,000 mg by mouth every 4 (four) hours as needed for moderate pain or headache.   apixaban (ELIQUIS) 5 MG TABS tablet Take 1 tablet (5 mg total) by mouth 2 (two) times daily. Will need to see MD for future refills.   B-D UF III MINI PEN NEEDLES 31G X 5 MM MISC SMARTSIG:1 Each SUB-Q Daily   clopidogrel (PLAVIX) 75 MG tablet  Take 1 tablet (75 mg total) by mouth daily.   DULoxetine (CYMBALTA) 60 MG capsule Take 60 mg by mouth daily.   ezetimibe (ZETIA) 10 MG tablet Take 10 mg by mouth daily.   Ferrous Sulfate Dried (HIGH POTENCY IRON) 65 MG TABS Take 65 mg by mouth daily.   fesoterodine (TOVIAZ) 8 MG TB24 tablet Take 8 mg by mouth every evening.    fluticasone (FLONASE) 50 MCG/ACT nasal spray Place 1 spray into both nostrils 2 (two) times daily as needed for allergies or rhinitis.   FREESTYLE LITE test strip Check blood sugar every morning   furosemide (LASIX) 20 MG tablet TAKE 1 TABLET DAILY (Patient taking differently: Take 20 mg by mouth daily.)   gabapentin (NEURONTIN) 300 MG capsule Take 300 mg by mouth 2 (two) times daily.   glimepiride (AMARYL) 1 MG tablet Take 1 mg by mouth daily.   hydrOXYzine (ATARAX/VISTARIL) 25 MG tablet Take 25-50 mg by mouth See admin instructions. 25 mg in the  morning 50 mg at bedtime   Insulin Glargine (LANTUS SOLOSTAR) 100 UNIT/ML Solostar Pen Inject 25 Units into the skin daily before breakfast.   isosorbide dinitrate (ISORDIL) 30 MG tablet TAKE 1 TABLET THREE TIMES A DAY (Patient taking differently: Take 30 mg by mouth 2 (two) times daily.)   loperamide (IMODIUM A-D) 2 MG tablet Take 2 mg by mouth 4 (four) times daily as needed for diarrhea or loose stools.   loratadine (CLARITIN) 10 MG tablet Take 10 mg by mouth daily as needed (allergies).   metoprolol succinate (TOPROL-XL) 25 MG 24 hr tablet Take 12.5 mg by mouth 2 (two) times daily.   NAMZARIC 28-10 MG CP24 Take 1 capsule by mouth daily. (Patient taking differently: Take 1 capsule by mouth at bedtime.)   nitrofurantoin, macrocrystal-monohydrate, (MACROBID) 100 MG capsule Take 100 mg by mouth See admin instructions. Bid x 7 days   nitroGLYCERIN (NITROSTAT) 0.4 MG SL tablet Place 1 tablet (0.4 mg total) under the tongue every 5 (five) minutes as needed for up to 25 days for chest pain. (Patient taking differently: Place 0.4 mg under the tongue every 5 (five) minutes as needed for chest pain.)   NON FORMULARY CPAP at bedtime   omeprazole (PRILOSEC) 20 MG capsule Take 20 mg by mouth every morning.   pantoprazole (PROTONIX) 40 MG tablet Take 40 mg by mouth daily as needed (heartburn).   potassium chloride SA (KLOR-CON M) 20 MEQ tablet TAKE 1 TABLET DAILY (Patient taking differently: Take 20 mEq by mouth daily.)   rosuvastatin (CRESTOR) 10 MG tablet Take 10 mg by mouth daily before supper.   sitaGLIPtin (JANUVIA) 100 MG tablet Take 100 mg by mouth daily.   Vibegron (GEMTESA) 75 MG TABS Take 75 mg by mouth daily.   vitamin B-12 (CYANOCOBALAMIN) 1000 MCG tablet Take 1,000 mcg by mouth at bedtime.    Scheduled Meds:  aspirin  81 mg Oral Pre-Cath   insulin aspart  0-5 Units Subcutaneous QHS   insulin aspart  0-9 Units Subcutaneous TID WC   Continuous Infusions:  sodium chloride     heparin 1,000  Units/hr (07/07/21 0512)   PRN Meds:.LORazepam, prochlorperazine   I/O last 3 completed shifts: In: -  Out: 800 [Urine:800] No intake/output data recorded.    Radiology:   [facility] Chest Port 1 View 07/07/2021 CLINICAL DATA:  Shortness of breath. EXAM: PORTABLE CHEST 1 VIEW COMPARISON:  10/07/2019 FINDINGS: Patient is post median sternotomy. Planted loop recorder in the left chest wall. Mild  cardiomegaly with unchanged mediastinal contours. Moderate pulmonary edema. Small pleural effusions and fluid in the fissure. No pneumothorax. IMPRESSION: CHF with moderate pulmonary edema and small pleural effusions. Electronically Signed   By: Keith Rake M.D.   On: 07/07/2021 00:10    Cardiac Studies:   Coronary angiogram 03/05/2021: LM: Trifurcates.  Ostium 80 to 90% stenosis. LAD: Heavy calcification is noted with ostial 90% stenosis.  Complete filling from the LIMA is noted. LCx: Large vessel, no significant disease. RI: Large vessel, bifurcating vessel, ostial 80 to 90% stenosis.  Competitive filling from the vein graft is seen. RCA: Large-caliber vessel and a dominant vessel.  70% stenosis in the ostium and mid RCA has a sequential 80 to 90% stenosis. LIMA to LAD patent. SVG to RI widely patent. SVG to RCA severely diffusely diseased and nonfunctional graft.   Successful IVUS guided PCI of the proximal to distal right coronary artery using 3.5 x 38 and a 3.5 x 38 mm Synergy DES.  Echocardiogram 07/07/2021:   1. Left ventricular ejection fraction, by estimation, is 35 to 40%. The left ventricle has moderately decreased function. The left ventricle demonstrates regional wall motion abnormalities (see scoring diagram/findings for description). There is mild  left ventricular hypertrophy. Left ventricular diastolic parameters are indeterminate. Elevated left ventricular end-diastolic pressure. There is severe of the left ventricular, entire anterior wall. There is mild of the left ventricular,  mid-apical  anterolateral wall.  2. Right ventricular systolic function is normal. The right ventricular size is normal. There is normal pulmonary artery systolic pressure. The estimated right ventricular systolic pressure is 52.8 mmHg.  3. Left atrial size was mildly dilated.  4. The mitral valve is normal in structure. Mild mitral valve regurgitation.  5. The aortic valve is tricuspid. Aortic valve regurgitation is mild to moderate. Aortic valve sclerosis/calcification is present, without any evidence of aortic stenosis.  6. The inferior vena cava is normal in size with <50% respiratory variability, suggesting right atrial pressure of 8 mmHg.   Comparison(s): Changes from prior study are noted. Compared to 08/23/2018, LVEF 60-65% without regional wall motion abnormality. Mild MR and TR new.  Compared to 11/27/2019, LVEF is 50 to 55%.  There was no wall motion abnormality.  Otherwise no significant change.  EKG:  EKG 07/06/2021: Probably normal sinus rhythm at rate of 99 bpm, left atrial abnormality.  Right bundle branch block.  ST depression V4 to V6, cannot exclude lateral ischemia.  Compared to the EKG done on 03/11/2021, no significant change, inferior ST segment depression not present.  Assessment   Linda Oliver is a 88 y.o. Caucasian female patient with CAD s/p CABG x 3 in 1996 in [place], HTN, hyperlipidemia, diabetes with PVD and stage 3a CKD and sleep apnea on CPAP and now compliant, carotid stenosis, and history of  autonomic orthostatic hypotension, CVA with medial left occipital infarct in embolic pattern by MRI in 2020 and loop recorder implanted showed paroxysmal atrial fibrillation.  Admitted with unstable angina and underwent coronary angiography on 03/05/2021 and overlapping stent plantation to the right coronary artery native as the SVG to RCA was functionally occluded.  Left grafts are widely patent.   1.  NSTEMI 2.  Coronary artery disease of the native vessel with unstable angina  pectoris 3.  Acute pulmonary edema 4.  Paroxysmal atrial fibrillation  CHA2DS2-VASc Score is 8.  Yearly risk of stroke: > 10% (F, A, HTN, DM, Vasc Dz, CHF).  Score of 1=0.6; 2=2.2; 3=3.2; 4=4.8; 5=7.2; 6=9.8; 7=>9.8) -(  CHF; HTN; vasc disease DM,  Female = 1; Age <65 =0; 65-74 = 1,  >75 =2; stroke/embolism= 2).  Recommendations:   I have reviewed the results of the echocardiogram with the patient's daughter Freda Munro.  Patient is in a precarious situation with acute florid pulmonary edema, respiratory distress.  Although 88 years of age, she is fairly independent and was living independently until past week on Monday she started feeling poorly.  This is very similar to her presentation when she presented with failure to thrive, fatigue, shortness of breath and underwent angiography and angioplasty to the right coronary artery in [facility] on 03/05/2021.  Initially she had done well with BiPAP, but again has deteriorated.  I canceled the cardiac catheterization as she will not be able to lay down flat in the catheterization lab.  Suspect her EDP is markedly elevated.  We will start her with diuresis.  For now continue IV heparin, she is presently on Eliquis we will continue to hold this, depending upon how she progresses, will consider cardiac catheterization either tomorrow or at a later date.  Elevation in troponin appears to be discordant with wall motion abnormality and the event appears to be acute.  Serial EKGs reviewed.  Patient is in atrial fibrillation.  Rate is relatively well controlled.  For now we will hold off on Eliquis and continue IV heparin until decision is made regarding whether she needs invasive strategy or not.  I also discussed with the patient's daughter regarding her CODE STATUS.  She would like her mother to be full code as patient is fairly independent and has been doing well until this acute event.  Resume her home medications but also had IV furosemide.  Follow-up  on BMPs.  Critical care time 1 hour.  Spent 55 minutes evaluating her, discussions with family, telephone encounter.     Adrian Prows, MD, [facility] 07/07/2021, 9:41 AM Office: [phone]

## 2021-07-07 NOTE — Progress Notes (Signed)
[place] for heparin Indication: chest pain/ACS and atrial fibrillation  Allergies  Allergen Reactions   Codeine     vomiting   Metformin     Other reaction(s): nausea, vomitting and diarrhea   Oxycodone-Acetaminophen     Other reaction(s): Unknown   Percocet [Oxycodone-Acetaminophen] Nausea And Vomiting   Tape     Other reaction(s): Unknown   Tolterodine Other (See Comments)   Latex Itching and Rash    EKG leads    Patient Measurements: Height: 5' (152.4 cm) Weight: 74.8 kg (165 lb) IBW/kg (Calculated) : 45.5 Heparin Dosing Weight: 65kg  Vital Signs: BP: 119/70 (06/28 1500) Pulse Rate: 88 (06/28 1500)  Labs: Recent Labs    07/06/21 2330 07/07/21 0152 07/07/21 0525 07/07/21 1500  HGB 11.3*  --   --   --   HCT 36.6  --   --   --   PLT 298  --   --   --   APTT  --   --   --  75*  HEPARINUNFRC  --   --   --  >1.10*  CREATININE 1.16*  --   --   --   TROPONINIHS 58* 196* 341*  --      Estimated Creatinine Clearance: 31.4 mL/min (A) (by C-G formula based on SCr of 1.16 mg/dL (H)).  Assessment: 88yo female c/o inc'd WOB w/ worsening SOB since Sat, troponin elevated and rising, on Eliquis PTA for Afib (last dose 6/26 am), to transition to heparin.  Heparin level >1.1 (affected by Eliquis as expected), aPTT 75 sec (therapeutic) on heparin at 1000 units/hr. No bleeding noted.  Goal of Therapy:  Heparin level 0.3-0.7 units/ml aPTT 66-102 seconds Monitor platelets by anticoagulation protocol: Yes   Plan:  Continue heparin 1000 units/hr  Will f/u 6 hr confirmatory aPTT  Sherlon Handing, PharmD, BCPS Please see amion for complete clinical pharmacist phone list 07/07/2021,3:44 PM

## 2021-07-07 NOTE — H&P (Signed)
History and Physical    Linda Oliver YNW:[id number] DOB: 04-Oct-1932 DOA: 07/06/2021  PCP: Shon Baton, MD (Confirm with patient/family/NH records and if not entered, this has to be entered at [facility] point of entry) Patient coming from: Home  I have personally briefly reviewed patient's old medical records in [place]  Chief Complaint: Cough, SOB  HPI: Linda Oliver is a 88 y.o. female with medical history significant of CAD status post CABG and stenting, HTN, PAF on Eliquis HLD, OSA on CPAP at bedtime, IDDM, cKd stage II, came with cough and SOB.  Started to have shortness of breath and dry cough 4 days ago and gradually getting worse.  Denies any fever chills no chest pains.  Last week patient also started to have burning sensations on urination and PCP started patient on nitrofurantoin and patient just completed 2 days worth.  Family called EMS, EMS arrived and found patient O2 saturation in the 70s and patient was put on BiPAP.  ED Course: O2 saturation 97% on BiPAP 40%, no tachycardia no hypotensive.  Blood pressure elevated mildly.  Chest x-ray showed pulmonary edema.  Blood work creatinine 1.1, glucose 278, WBC 19.3, troponin 58> 196.  Patient was given Lasix x1.  Review of Systems: Unable to perform because of patient has severe shortness of breath   Past Medical History:  Diagnosis Date   Anginal pain ([place])    occ   Anxiety    Broken ankle    Bullous pemphigus    CAD (coronary artery disease)    [place]   80% stenosis ostial Left main, 30% stenosis proximal LAD, 80% stenosis proximal RCA CABG  -  LIMA to LAD, SVG to int, SVG to [place], [place]    Depression    Diabetic peripheral neuropathy ([place])    Encounter for loop recorder check 04/11/2019   GERD (gastroesophageal reflux disease)    H/O: stroke: Left occipital stroke and vision loss 08/24/2018   Headache(784.0)    hx migraines   Heart murmur    mvp   Hyperlipidemia    Hypertension     Hypertensive heart disease    Loop recorder Reveal 08/24/2018 04/11/2019   Scheduled Remote loop recorder check 03/10/2019: The presenting rhythm is sinus bradycardia. There were 0 symptomatic patient activations recorded. There were 275 AF episodes and 0 AT episodes recorded, representing a 4.2 % cumulative AT/AF burden. The longest episode lasted 00:03:40:00 in duration. Some false with PAC. There were 0 tachy episodes detected. There were 0 pause episodes detected. Th   Nasal bleeding    occ due to O2   OA (osteoarthritis)    Obesity (BMI 30-39.9)    Obstructive sleep apnea on CPAP    Osteopenia    Paroxysmal atrial fibrillation ([place]) 04/17/2019   Peripheral neuropathy    Seizure ([place])    2009   Sleep apnea    dx 29yr ago doesnt use cpap but uses O2 at nite 2L   Stones in the urinary tract    Type 2 diabetes mellitus with vascular disease ([place]    UTI (lower urinary tract infection)     Past Surgical History:  Procedure Laterality Date   ABDOMINAL HYSTERECTOMY  70's   CARDIAC CATHETERIZATION     CARPAL TUNNEL RELEASE Bilateral    CATARACT EXTRACTION W/PHACO Bilateral    CHOLECYSTECTOMY     CORONARY ANGIOPLASTY WITH STENT PLACEMENT  2023   [place]  EYE SURGERY Left    LOOP RECORDER INSERTION N/A 08/24/2018   Procedure: LOOP RECORDER INSERTION;  Surgeon: Constance Haw, MD;  Location: [facility];  Service: Cardiovascular;  Laterality: N/A;   SHOULDER ARTHROSCOPY     rt   SHOULDER ARTHROSCOPY WITH ROTATOR CUFF REPAIR AND SUBACROMIAL DECOMPRESSION  12/01/2011   Procedure: SHOULDER ARTHROSCOPY WITH ROTATOR CUFF REPAIR AND SUBACROMIAL DECOMPRESSION;  Surgeon: Nita Sells, MD;  Location: [facility];  Service: Orthopedics;  Laterality: Left;  shoulder debridement calcific tendonitis     reports that she quit smoking about 34 years ago. Her smoking use included cigarettes. She has a 30.00 pack-year smoking history. She has never used  smokeless tobacco. She reports that she does not drink alcohol and does not use drugs.  Allergies  Allergen Reactions   Codeine     vomiting   Metformin     Other reaction(s): nausea, vomitting and diarrhea   Oxycodone-Acetaminophen     Other reaction(s): Unknown   Percocet [Oxycodone-Acetaminophen] Nausea And Vomiting   Tape     Other reaction(s): Unknown   Tolterodine Other (See Comments)   Latex Itching and Rash    EKG leads    Family History  Problem Relation Age of Onset   Coronary artery disease Mother    Heart disease Father    Cancer Father        skin   Coronary artery disease Father    Cancer Brother    Cancer - Colon Brother        brain, Lymphoma     Prior to Admission medications   Medication Sig Start Date End Date Taking? Authorizing Provider  acetaminophen (TYLENOL) 500 MG tablet Take 1,000 mg by mouth every 4 (four) hours as needed for moderate pain or headache.   Yes [provider]  apixaban (ELIQUIS) 5 MG TABS tablet Take 1 tablet (5 mg total) by mouth 2 (two) times daily. Will need to see MD for future refills. 08/22/19  Yes Camnitz, Ocie Doyne, MD  B-D UF III MINI PEN NEEDLES 31G X 5 MM MISC SMARTSIG:1 Each SUB-Q Daily 03/14/19  Yes [provider]  clopidogrel (PLAVIX) 75 MG tablet Take 1 tablet (75 mg total) by mouth daily. 03/11/21  Yes Cantwell, Celeste C, PA-C  DULoxetine (CYMBALTA) 60 MG capsule Take 60 mg by mouth daily. 10/16/17  Yes [provider]  ezetimibe (ZETIA) 10 MG tablet Take 10 mg by mouth daily. 09/17/19  Yes [provider]  Ferrous Sulfate Dried (HIGH POTENCY IRON) 65 MG TABS Take 65 mg by mouth daily.   Yes [provider]  fesoterodine (TOVIAZ) 8 MG TB24 tablet Take 8 mg by mouth every evening.    Yes [provider]  fluticasone (FLONASE) 50 MCG/ACT nasal spray Place 1 spray into both nostrils 2 (two) times daily as needed for allergies or rhinitis.   Yes [provider]   FREESTYLE LITE test strip Check blood sugar every morning 04/09/19  Yes [provider]  furosemide (LASIX) 20 MG tablet TAKE 1 TABLET DAILY Patient taking differently: Take 20 mg by mouth daily. 09/01/20  Yes Adrian Prows, MD  gabapentin (NEURONTIN) 300 MG capsule Take 300 mg by mouth 2 (two) times daily.   Yes [provider]  glimepiride (AMARYL) 1 MG tablet Take 1 mg by mouth daily.   Yes [provider]  hydrOXYzine (ATARAX/VISTARIL) 25 MG tablet Take 25-50 mg by mouth See admin instructions. 25 mg in the  morning 50 mg at bedtime   Yes [provider]  Insulin Glargine (LANTUS SOLOSTAR) 100 UNIT/ML Solostar Pen Inject 25 Units into the skin daily before breakfast.   Yes [provider]  isosorbide dinitrate (ISORDIL) 30 MG tablet TAKE 1 TABLET THREE TIMES A DAY Patient taking differently: Take 30 mg by mouth 2 (two) times daily. 07/06/21  Yes Adrian Prows, MD  loperamide (IMODIUM A-D) 2 MG tablet Take 2 mg by mouth 4 (four) times daily as needed for diarrhea or loose stools.   Yes [provider]  loratadine (CLARITIN) 10 MG tablet Take 10 mg by mouth daily as needed (allergies).   Yes [provider]  metoprolol succinate (TOPROL-XL) 25 MG 24 hr tablet Take 12.5 mg by mouth 2 (two) times daily.   Yes [provider]  NAMZARIC 28-10 MG CP24 Take 1 capsule by mouth daily. Patient taking differently: Take 1 capsule by mouth at bedtime. 06/09/21  Yes Melvenia Beam, MD  nitrofurantoin, macrocrystal-monohydrate, (MACROBID) 100 MG capsule Take 100 mg by mouth See admin instructions. Bid x 7 days 07/05/21  Yes [provider]  nitroGLYCERIN (NITROSTAT) 0.4 MG SL tablet Place 1 tablet (0.4 mg total) under the tongue every 5 (five) minutes as needed for up to 25 days for chest pain. Patient taking differently: Place 0.4 mg under the tongue every 5 (five) minutes as needed for chest pain. 05/29/20 07/07/21 Yes Adrian Prows, MD  NON  FORMULARY CPAP at bedtime   Yes [provider]  omeprazole (PRILOSEC) 20 MG capsule Take 20 mg by mouth every morning. 06/24/21  Yes [provider]  pantoprazole (PROTONIX) 40 MG tablet Take 40 mg by mouth daily as needed (heartburn). 10/31/17  Yes [provider]  potassium chloride SA (KLOR-CON M) 20 MEQ tablet TAKE 1 TABLET DAILY Patient taking differently: Take 20 mEq by mouth daily. 01/29/21  Yes Adrian Prows, MD  rosuvastatin (CRESTOR) 10 MG tablet Take 10 mg by mouth daily before supper.   Yes [provider]  sitaGLIPtin (JANUVIA) 100 MG tablet Take 100 mg by mouth daily.   Yes [provider]  Vibegron (GEMTESA) 75 MG TABS Take 75 mg by mouth daily.   Yes [provider]  vitamin B-12 (CYANOCOBALAMIN) 1000 MCG tablet Take 1,000 mcg by mouth at bedtime.   Yes [provider]  cyclopentolate (CYCLOGYL) 1 % ophthalmic solution Place 1 drop into the left eye 2 (two) times daily. Patient not taking: Reported on 07/07/2021 04/13/21 04/14/22  Hurman Horn, MD    Physical Exam: Vitals:   07/07/21 0800 07/07/21 0815 07/07/21 0830 07/07/21 0845  BP: (!) 137/54 136/66 (!) 128/93 (!) 127/57  Pulse: 90 78 (!) 30 79  Resp: (!) 27 (!) 24 (!) 24 (!) 22  Temp:      SpO2: 98% 97% 99% 100%  Weight:      Height:        Constitutional: NAD, calm, comfortable Vitals:   07/07/21 0800 07/07/21 0815 07/07/21 0830 07/07/21 0845  BP: (!) 137/54 136/66 (!) 128/93 (!) 127/57  Pulse: 90 78 (!) 30 79  Resp: (!) 27 (!) 24 (!) 24 (!) 22  Temp:      SpO2: 98% 97% 99% 100%  Weight:      Height:       Eyes: PERRL, lids and conjunctivae normal ENMT: Mucous membranes are moist. Posterior pharynx clear of any exudate or lesions.Normal dentition.  Neck: normal, supple, no masses, no thyromegaly  Respiratory: clear to auscultation bilaterally, no wheezing, fine crackles.  Increasing respiratory effort. No accessory muscle use.  Cardiovascular: Regular  rate and rhythm, no murmurs / rubs / gallops. No extremity edema. 2+ pedal pulses. No carotid bruits.  Abdomen: no tenderness, no masses palpated. No hepatosplenomegaly. Bowel sounds positive.  Musculoskeletal: no clubbing / cyanosis. No joint deformity upper and lower extremities. Good ROM, no contractures. Normal muscle tone.  Skin: no rashes, lesions, ulcers. No induration Neurologic: CN 2-12 grossly intact. Sensation intact, DTR normal. Strength 5/5 in all 4.  Psychiatric: Normal judgment and insight. Alert and oriented x 3. Normal mood.    Labs on Admission: I have personally reviewed following labs and imaging studies  CBC: Recent Labs  Lab 07/06/21 2330  WBC 19.3*  HGB 11.3*  HCT 36.6  MCV 86.5  PLT 094   Basic Metabolic Panel: Recent Labs  Lab 07/06/21 2330 07/07/21 0525  NA 135  --   K 4.6  --   CL 100  --   CO2 24  --   GLUCOSE 278*  --   BUN 15  --   CREATININE 1.16*  --   CALCIUM 8.7*  --   MG  --  1.8   GFR: Estimated Creatinine Clearance: 31.4 mL/min (A) (by C-G formula based on SCr of 1.16 mg/dL (H)). Liver Function Tests: Recent Labs  Lab 07/06/21 2330  AST 44*  ALT 24  ALKPHOS 134*  BILITOT 1.4*  PROT 6.9  ALBUMIN 3.1*   No results for input(s): "LIPASE", "AMYLASE" in the last 168 hours. No results for input(s): "AMMONIA" in the last 168 hours. Coagulation Profile: No results for input(s): "INR", "PROTIME" in the last 168 hours. Cardiac Enzymes: No results for input(s): "CKTOTAL", "CKMB", "CKMBINDEX", "TROPONINI" in the last 168 hours. BNP (last 3 results) No results for input(s): "PROBNP" in the last 8760 hours. HbA1C: No results for input(s): "HGBA1C" in the last 72 hours. CBG: Recent Labs  Lab 07/07/21 0917  GLUCAP 201*   Lipid Profile: No results for input(s): "CHOL", "HDL", "LDLCALC", "TRIG", "CHOLHDL", "LDLDIRECT" in the last 72 hours. Thyroid Function Tests: No results for input(s): "TSH", "T4TOTAL", "FREET4", "T3FREE",  "THYROIDAB" in the last 72 hours. Anemia Panel: No results for input(s): "VITAMINB12", "FOLATE", "FERRITIN", "TIBC", "IRON", "RETICCTPCT" in the last 72 hours. Urine analysis:    Component Value Date/Time   COLORURINE YELLOW 03/05/2017 0250   APPEARANCEUR CLEAR 03/05/2017 0250   LABSPEC 1.010 09/18/2019 1903   PHURINE 5.0 09/18/2019 1903   GLUCOSEU 100 (A) 09/18/2019 1903   HGBUR NEGATIVE 09/18/2019 1903   BILIRUBINUR NEGATIVE 09/18/2019 1903   KETONESUR NEGATIVE 09/18/2019 1903   PROTEINUR 30 (A) 09/18/2019 1903   UROBILINOGEN 1.0 09/18/2019 1903   NITRITE POSITIVE (A) 09/18/2019 1903   LEUKOCYTESUR NEGATIVE 09/18/2019 1903    Radiological Exams on Admission: DG Chest Port 1 View  Result Date: 07/07/2021 CLINICAL DATA:  Shortness of breath. EXAM: PORTABLE CHEST 1 VIEW COMPARISON:  10/07/2019 FINDINGS: Patient is post median sternotomy. Planted loop recorder in the left chest wall. Mild cardiomegaly with unchanged mediastinal contours. Moderate pulmonary edema. Small pleural effusions and fluid in the fissure. No pneumothorax. IMPRESSION: CHF with moderate pulmonary edema and small pleural effusions. Electronically Signed   By: Keith Rake M.D.   On: 07/07/2021 00:10    EKG: Independently reviewed.  Sinus, no acute ST changes.  Assessment/Plan Principal Problem:   Acute on chronic diastolic CHF (congestive heart failure) (HCC) Active Problems:   Prolonged QT  interval   NSTEMI (non-ST elevated myocardial infarction) (HCC)   CHF (congestive heart failure) ([place])  (please populate well all problems here in Problem List. (For example, if patient is on BP meds at home and you resume or decide to hold them, it is a problem that needs to be her. Same for CAD, COPD, HLD and so on)  Acute hypoxic respiratory failure -Likely secondary to NSTEMI, bedside echo today showed creased LVEF 35%. -Still has significant symptoms signs of fluid overload, continue Lasix twice daily -Case  discussed with cardiology Dr. Einar Gip and patient's daughter at bedside.  Initial plan to cath is on hold due to patient breathing status. -Patient sick with history of CAD coming with NSTEMI and new onset of systolic CHF, discussed with family desire full code.  Discussed with cardiology, current plan is to symptomatic manage, if no significant improvement in the next 24-48 hours, will consider comfort care talk and palliative care consult.  NSTEMI -Change Eliquis to heparin drip -Continue aspirin Plavix and statin, beta-blocker and statin -Cath per cardiology  Leukocytosis -Doubt sepsis -Infection source likely UTI, continue nitrofurantoin -Repeat CBC in the morning  IDDM -Hold glimepiride, continue Lantus, add sliding scale  PAF -Rate controlled, continue metoprolol, on heparin drip for NSTEMI  OSA -On BiPAP, CPAP at bedtime  DVT prophylaxis: Heparin drip Code Status: Full code Family Communication: Daughter at bedside Disposition Plan: Patient sick with heart attack and CHF, requiring IV diuresis, and cardiology work-up, expect more than 2 midnight hospital stay Consults called: Cardiology Admission status: PCU  Lequita Halt MD [facility] Pager [phone]  07/07/2021, 9:32 AM

## 2021-07-07 NOTE — Progress Notes (Signed)
[place] for heparin Indication: chest pain/ACS and atrial fibrillation  Allergies  Allergen Reactions   Codeine     vomiting   Metformin     Other reaction(s): nausea, vomitting and diarrhea   Oxycodone-Acetaminophen     Other reaction(s): Unknown   Percocet [Oxycodone-Acetaminophen] Nausea And Vomiting   Tape     Other reaction(s): Unknown   Tolterodine Other (See Comments)   Latex Itching and Rash    EKG leads    Patient Measurements: Height: 5' (152.4 cm) Weight: 73 kg (160 lb 15 oz) IBW/kg (Calculated) : 45.5 Heparin Dosing Weight: 65kg  Vital Signs: Temp: 98 F (36.7 C) (06/28 1921) Temp Source: Oral (06/28 1921) BP: 139/60 (06/28 1921) Pulse Rate: 106 (06/28 2100)  Labs: Recent Labs    07/06/21 2330 07/07/21 0152 07/07/21 0525 07/07/21 1500 07/07/21 2131  HGB 11.3*  --   --   --   --   HCT 36.6  --   --   --   --   PLT 298  --   --   --   --   APTT  --   --   --  75* 70*  HEPARINUNFRC  --   --   --  >1.10*  --   CREATININE 1.16*  --   --   --   --   TROPONINIHS 58* 196* 341*  --   --      Estimated Creatinine Clearance: 31.1 mL/min (A) (by C-G formula based on SCr of 1.16 mg/dL (H)).  Assessment: 88yo female c/o inc'd WOB w/ worsening SOB since Sat, troponin elevated and rising, on Eliquis PTA for Afib (last dose 6/26 am), to transition to heparin.  Heparin level >1.1 (affected by Eliquis as expected), aPTT 75 sec (therapeutic) on heparin at 1000 units/hr. No bleeding noted.  6/28 PM update:  aPTT therapeutic x 2  Goal of Therapy:  Heparin level 0.3-0.7 units/ml aPTT 66-102 seconds Monitor platelets by anticoagulation protocol: Yes   Plan:  Cont heparin at 1000 units/hr Daily aPTT, CBC, and heparin level  Sherlon Handing, PharmD, BCPS Narda Bonds, PharmD, [place] Pharmacist Phone: [phone]

## 2021-07-07 NOTE — ED Notes (Signed)
Pt had an episode of emesis (clear liquid). This RN was able to remove CPAP when this occurred. Pt stated she feels fine now and wants the CPAP on. CPAP back on d/t improvement when N/V. MD Opyd notified

## 2021-07-07 NOTE — Progress Notes (Signed)
D/C IVF due to pulmonary edema and fluid overload  DC potassium p.o. due to K= 4.6.

## 2021-07-07 NOTE — Progress Notes (Signed)
  Echocardiogram 2D Echocardiogram has been performed.  Darlina Sicilian M 07/07/2021, 8:36 AM

## 2021-07-07 NOTE — Progress Notes (Signed)
Heart Failure Stewardship Pharmacist Progress Note   PCP: Shon Baton, MD PCP-Cardiologist: None    HPI:  88 yo F with PMH of CAD s/p CABG, HTN, afib, HLD, OSA, T2DM, and CKD II.  She presented to the ED on 6/27 with shortness of breath, O2 sat 70% at home, and cough. Also with burning sensations when urination - she was started on Macrobid for 7 days as outpatient on 6/26.   ECHO 6/28 with LVEF 35-40% (dropped from 50-55% in 11/2019), mild LVH, mild MR, RV ok.  Current HF Medications: Diuretic: furosemide 40 mg IV BID  Prior to admission HF Medications: Diuretic: furosemide 20 mg daily Beta blocker: metoprolol XL 12.5 mg BID Other: Imdur 30 mg BID  Pertinent Lab Values: Serum creatinine 1.16. BUN 15, Potassium 4.6, Sodium 135, Magnesium 1.8 BNP 501.1 A1c pending Urinalysis pending  Vital Signs: Weight: 165 lbs (admission weight: 165 lbs) Blood pressure: 130/60s  Heart rate: 80s  I/O: not recorded yesterday; net -813m  Medication Assistance / Insurance Benefits Check: Does the patient have prescription insurance?  Yes [place] of [place]  Prior to admission outpatient pharmacy: [facility] Is the patient willing to use [facility]? Yes Is the patient [facility]   Pending    Assessment: 1. Acute systolic CHF (LVEF 309-32%. NYHA class III symptoms. - Continue furosemide 40 mg IV BID - Consider restarting metoprolol XL 12.5 mg BID - Consider starting Entresto 24/26 mg BID if BP and SCr allow - Consider starting spironolactone prior to discharge  - Consider starting Jardiance 10 mg daily before discharge - preferred SGLT2i with [employer]. Hold off with ongoing UTI workup.   Plan: 1) Medication changes recommended at this time: - Continue IV diuresis - Restart metoprolol - Start spironolactone  2) Patient assistance: - None needed -  has [employer] insurance  3)  Education  - To be completed prior to discharge  MKerby Nora PharmD, BCPS Heart Failure SCytogeneticistPhone ([phone]

## 2021-07-08 ENCOUNTER — Ambulatory Visit (HOSPITAL_COMMUNITY): Admit: 2021-07-08 | Payer: Medicare Other | Admitting: Cardiology

## 2021-07-08 ENCOUNTER — Encounter (HOSPITAL_COMMUNITY)
Admission: EM | Disposition: A | Discharge status: Home Health | Payer: Self-pay | Source: Home / Self Care | Attending: Internal Medicine

## 2021-07-08 ENCOUNTER — Inpatient Hospital Stay (HOSPITAL_COMMUNITY): Payer: Medicare Other | Admitting: Certified Registered"

## 2021-07-08 ENCOUNTER — Encounter (HOSPITAL_COMMUNITY): Payer: Self-pay | Admitting: Internal Medicine

## 2021-07-08 DIAGNOSIS — E782 Mixed hyperlipidemia: Secondary | ICD-10-CM

## 2021-07-08 DIAGNOSIS — I509 Heart failure, unspecified: Secondary | ICD-10-CM

## 2021-07-08 DIAGNOSIS — I5033 Acute on chronic diastolic (congestive) heart failure: Secondary | ICD-10-CM | POA: Diagnosis not present

## 2021-07-08 DIAGNOSIS — I4891 Unspecified atrial fibrillation: Secondary | ICD-10-CM

## 2021-07-08 DIAGNOSIS — I110 Hypertensive heart disease with heart failure: Secondary | ICD-10-CM | POA: Diagnosis not present

## 2021-07-08 DIAGNOSIS — E669 Obesity, unspecified: Secondary | ICD-10-CM

## 2021-07-08 DIAGNOSIS — K219 Gastro-esophageal reflux disease without esophagitis: Secondary | ICD-10-CM

## 2021-07-08 DIAGNOSIS — I25119 Atherosclerotic heart disease of native coronary artery with unspecified angina pectoris: Secondary | ICD-10-CM

## 2021-07-08 DIAGNOSIS — I119 Hypertensive heart disease without heart failure: Secondary | ICD-10-CM | POA: Diagnosis not present

## 2021-07-08 DIAGNOSIS — I214 Non-ST elevation (NSTEMI) myocardial infarction: Secondary | ICD-10-CM

## 2021-07-08 DIAGNOSIS — G4733 Obstructive sleep apnea (adult) (pediatric): Secondary | ICD-10-CM

## 2021-07-08 DIAGNOSIS — I4819 Other persistent atrial fibrillation: Secondary | ICD-10-CM

## 2021-07-08 DIAGNOSIS — E1159 Type 2 diabetes mellitus with other circulatory complications: Secondary | ICD-10-CM

## 2021-07-08 DIAGNOSIS — R9431 Abnormal electrocardiogram [ECG] [EKG]: Secondary | ICD-10-CM

## 2021-07-08 DIAGNOSIS — I480 Paroxysmal atrial fibrillation: Secondary | ICD-10-CM

## 2021-07-08 DIAGNOSIS — Z9989 Dependence on other enabling machines and devices: Secondary | ICD-10-CM

## 2021-07-08 HISTORY — PX: CARDIOVERSION: SHX1299

## 2021-07-08 LAB — HEPATIC FUNCTION PANEL
ALT: 22 U/L (ref 0–44)
AST: 34 U/L (ref 15–41)
Albumin: 2.7 g/dL — ABNORMAL LOW (ref 3.5–5.0)
Alkaline Phosphatase: 115 U/L (ref 38–126)
Bilirubin, Direct: 0.3 mg/dL — ABNORMAL HIGH (ref 0.0–0.2)
Indirect Bilirubin: 1.1 mg/dL — ABNORMAL HIGH (ref 0.3–0.9)
Total Bilirubin: 1.4 mg/dL — ABNORMAL HIGH (ref 0.3–1.2)
Total Protein: 6.6 g/dL (ref 6.5–8.1)

## 2021-07-08 LAB — BASIC METABOLIC PANEL
Anion gap: 9 (ref 5–15)
Anion gap: 12 (ref 5–15)
BUN: 20 mg/dL (ref 8–23)
BUN: 21 mg/dL (ref 8–23)
CO2: 32 mmol/L (ref 22–32)
CO2: 31 mmol/L (ref 22–32)
Calcium: 8.4 mg/dL — ABNORMAL LOW (ref 8.9–10.3)
Calcium: 8.5 mg/dL — ABNORMAL LOW (ref 8.9–10.3)
Chloride: 97 mmol/L — ABNORMAL LOW (ref 98–111)
Chloride: 95 mmol/L — ABNORMAL LOW (ref 98–111)
Creatinine, Ser: 1.27 mg/dL — ABNORMAL HIGH (ref 0.44–1.00)
Creatinine, Ser: 1.35 mg/dL — ABNORMAL HIGH (ref 0.44–1.00)
GFR, Estimated: 41 mL/min — ABNORMAL LOW (ref >60)
GFR, Estimated: 38 mL/min — ABNORMAL LOW (ref >60)
Glucose, Bld: 96 mg/dL (ref 70–99)
Glucose, Bld: 225 mg/dL — ABNORMAL HIGH (ref 70–99)
Potassium: 3.3 mmol/L — ABNORMAL LOW (ref 3.5–5.1)
Potassium: 3.5 mmol/L (ref 3.5–5.1)
Sodium: 138 mmol/L (ref 135–145)
Sodium: 138 mmol/L (ref 135–145)

## 2021-07-08 LAB — CBC
HCT: 31.6 % — ABNORMAL LOW (ref 36.0–46.0)
Hemoglobin: 10.1 g/dL — ABNORMAL LOW (ref 12.0–15.0)
MCH: 26.5 pg (ref 26.0–34.0)
MCHC: 32 g/dL (ref 30.0–36.0)
MCV: 82.9 fL (ref 80.0–100.0)
Platelets: 253 10*3/uL (ref 150–400)
RBC: 3.81 MIL/uL — ABNORMAL LOW (ref 3.87–5.11)
RDW: 15.3 % (ref 11.5–15.5)
WBC: 9.8 10*3/uL (ref 4.0–10.5)
nRBC: 0 % (ref 0.0–0.2)

## 2021-07-08 LAB — GLUCOSE, CAPILLARY
Glucose-Capillary: 97 mg/dL (ref 70–99)
Glucose-Capillary: 208 mg/dL — ABNORMAL HIGH (ref 70–99)
Glucose-Capillary: 265 mg/dL — ABNORMAL HIGH (ref 70–99)
Glucose-Capillary: 250 mg/dL — ABNORMAL HIGH (ref 70–99)
Glucose-Capillary: 294 mg/dL — ABNORMAL HIGH (ref 70–99)

## 2021-07-08 LAB — BRAIN NATRIURETIC PEPTIDE: B Natriuretic Peptide: 275.2 pg/mL — ABNORMAL HIGH (ref 0.0–100.0)

## 2021-07-08 LAB — PROTIME-INR
INR: 1.5 — ABNORMAL HIGH (ref 0.8–1.2)
Prothrombin Time: 17.7 seconds — ABNORMAL HIGH (ref 11.4–15.2)

## 2021-07-08 LAB — DIGOXIN LEVEL: Digoxin Level: <0.2 ng/mL — ABNORMAL LOW (ref 0.8–2.0)

## 2021-07-08 LAB — HEPARIN LEVEL (UNFRACTIONATED): Heparin Unfractionated: >1.1 IU/mL — ABNORMAL HIGH (ref 0.30–0.70)

## 2021-07-08 LAB — APTT: aPTT: 74 seconds — ABNORMAL HIGH (ref 24–36)

## 2021-07-08 LAB — TSH: TSH: 1.097 u[IU]/mL (ref 0.350–4.500)

## 2021-07-08 SURGERY — CARDIOVERSION
Anesthesia: General

## 2021-07-08 SURGERY — LEFT HEART CATH AND CORONARY ANGIOGRAPHY
Anesthesia: LOCAL

## 2021-07-08 MED ORDER — SODIUM CHLORIDE 0.9 % IV SOLN: INTRAVENOUS | Status: DC

## 2021-07-08 MED ORDER — PROPOFOL 10 MG/ML IV BOLUS
INTRAVENOUS | Status: DC | PRN
  Administered 2021-07-08 (×2): 15 mg via INTRAVENOUS

## 2021-07-08 MED ORDER — SODIUM CHLORIDE 0.9 % IV SOLN: 250.0000 mL | INTRAVENOUS | Status: DC | PRN

## 2021-07-08 MED ORDER — ACETAMINOPHEN 650 MG RE SUPP
650.0000 mg | Freq: Four times a day (QID) | RECTAL | Status: DC | PRN
Start: 2021-07-08 — End: 2021-07-13

## 2021-07-08 MED ORDER — SODIUM CHLORIDE 0.9% FLUSH: 3.0000 mL | Freq: Two times a day (BID) | INTRAVENOUS | Status: DC

## 2021-07-08 MED ORDER — AMIODARONE HCL IN DEXTROSE 360-4.14 MG/200ML-% IV SOLN
30.0000 mg/h | INTRAVENOUS | Status: DC
  Administered 2021-07-08 – 2021-07-09 (×2): 30 mg/h via INTRAVENOUS
  Filled 2021-07-08 (×2): qty 200

## 2021-07-08 MED ORDER — AMIODARONE HCL IN DEXTROSE 360-4.14 MG/200ML-% IV SOLN
60.0000 mg/h | INTRAVENOUS | Status: AC
  Administered 2021-07-08: 60 mg/h via INTRAVENOUS
  Filled 2021-07-08: qty 200

## 2021-07-08 MED ORDER — CEPHALEXIN 250 MG PO CAPS
250.0000 mg | ORAL_CAPSULE | Freq: Two times a day (BID) | ORAL | Status: DC
  Administered 2021-07-08 – 2021-07-10 (×4): 250 mg via ORAL
  Filled 2021-07-08 (×4): qty 1

## 2021-07-08 MED ORDER — AMIODARONE HCL IN DEXTROSE 360-4.14 MG/200ML-% IV SOLN
INTRAVENOUS | Status: AC
  Filled 2021-07-08: qty 200

## 2021-07-08 MED ORDER — LORAZEPAM 2 MG/ML IJ SOLN
0.5000 mg | Freq: Once | INTRAMUSCULAR | Status: DC
Start: 2021-07-08 — End: 2021-07-09

## 2021-07-08 MED ORDER — POTASSIUM CHLORIDE ER 10 MEQ PO TBCR
10.0000 meq | EXTENDED_RELEASE_TABLET | ORAL | Status: AC
  Administered 2021-07-08 (×3): 10 meq via ORAL
  Filled 2021-07-08 (×4): qty 1

## 2021-07-08 MED ORDER — POTASSIUM CHLORIDE CRYS ER 20 MEQ PO TBCR
40.0000 meq | EXTENDED_RELEASE_TABLET | Freq: Once | ORAL | Status: AC
  Administered 2021-07-08: 40 meq via ORAL
  Filled 2021-07-08: qty 2

## 2021-07-08 MED ORDER — AMIODARONE LOAD VIA INFUSION
150.0000 mg | Freq: Once | INTRAVENOUS | Status: AC
  Administered 2021-07-08: 150 mg via INTRAVENOUS

## 2021-07-08 MED ORDER — SODIUM CHLORIDE 0.9% FLUSH: 3.0000 mL | INTRAVENOUS | Status: DC | PRN

## 2021-07-08 MED ORDER — FUROSEMIDE 10 MG/ML IJ SOLN
40.0000 mg | Freq: Every day | INTRAMUSCULAR | Status: DC
  Filled 2021-07-08: qty 4

## 2021-07-08 MED ORDER — ASPIRIN 81 MG PO CHEW: 81.0000 mg | CHEWABLE_TABLET | ORAL | Status: DC

## 2021-07-08 MED ORDER — DAPAGLIFLOZIN PROPANEDIOL 10 MG PO TABS
10.0000 mg | ORAL_TABLET | Freq: Every day | ORAL | Status: DC
  Administered 2021-07-08: 10 mg via ORAL
  Filled 2021-07-08 (×2): qty 1

## 2021-07-08 NOTE — Interval H&P Note (Signed)
History and Physical Interval Note:  07/08/2021 4:43 PM  Linda Oliver  has presented today for surgery, with the diagnosis of afib.  The various methods of treatment have been discussed with the patient and family. After consideration of risks, benefits and other options for treatment, the patient has consented to  Procedure(s): CARDIOVERSION (N/A) as a surgical intervention.  The patient's history has been reviewed, patient examined, no change in status, stable for surgery.  I have reviewed the patient's chart and labs.  Questions were answered to the patient's satisfaction.    Patient again in flash pulmonary edema with AF with RVR. Best option to cardiovert with Amio on board. If fails, will reattempt after fully loaded Adrian Prows

## 2021-07-08 NOTE — Progress Notes (Signed)
ANTICOAGULATION CONSULT NOTE  Pharmacy Consult for heparin Indication: chest pain/ACS and atrial fibrillation  Allergies  Allergen Reactions   Codeine     vomiting   Metformin     Other reaction(s): nausea, vomitting and diarrhea   Oxycodone-Acetaminophen     Other reaction(s): Unknown   Percocet [Oxycodone-Acetaminophen] Nausea And Vomiting   Tape     Other reaction(s): Unknown   Tolterodine Other (See Comments)   Latex Itching and Rash    EKG leads    Patient Measurements: Height: 5' (152.4 cm) Weight: 73.8 kg (162 lb 11.2 oz) IBW/kg (Calculated) : 45.5 Heparin Dosing Weight: 65kg  Vital Signs: Temp: 98.4 F (36.9 C) (06/29 0726) Temp Source: Oral (06/29 0726) BP: 138/55 (06/29 0726) Pulse Rate: 96 (06/29 0726)  Labs: Recent Labs    07/06/21 2330 07/07/21 0152 07/07/21 0525 07/07/21 1500 07/07/21 2131 07/08/21 0628  HGB 11.3*  --   --   --   --  10.1*  HCT 36.6  --   --   --   --  31.6*  PLT 298  --   --   --   --  253  APTT  --   --   --  75* 70* 74*  HEPARINUNFRC  --   --   --  >1.10*  --  >1.10*  CREATININE 1.16*  --   --   --   --  1.27*  TROPONINIHS 58* 196* 341*  --   --   --      Estimated Creatinine Clearance: 28.5 mL/min (A) (by C-G formula based on SCr of 1.27 mg/dL (H)).  Assessment: 88yo female c/o inc'd WOB w/ worsening SOB since Sat, troponin elevated and rising, on Eliquis PTA for Afib (last dose 6/26 am), to transition to heparin.  Heparin level >1.1 (affected by Eliquis as expected), aPTT 74 sec (therapeutic) on heparin at 1000 units/hr. No bleeding noted. Hemoglobin stable 10.1.   Goal of Therapy:  Heparin level 0.3-0.7 units/ml aPTT 66-102 seconds Monitor platelets by anticoagulation protocol: Yes   Plan:  Continue heparin at 1000 units/hr Daily aPTT, CBC, and heparin level  Erin Hearing PharmD., BCPS Clinical Pharmacist 07/08/2021 8:39 AM

## 2021-07-08 NOTE — Progress Notes (Signed)
RT at bedside to find pt on 2L [place] and BiPAP on stby in room. RT will continue to monitor pt.

## 2021-07-08 NOTE — Evaluation (Signed)
Physical Therapy Evaluation Patient Details Name: Linda Oliver MRN: [id number] DOB: Jan 20, 1932 Today's Date: 07/08/2021  History of Present Illness  88 y.o. female presents to [facility] hospital on 07/06/2021 with dyspnea, cough and fatigue. Pt hypoxic in ED requiring BiPAP, chet x-ray with pulmonary edema, troponins elevated. Pt admitted for management of NSTEMI, CHF. PMH includes anxiety, CAD, depression, HTN, PAF, seizure, DMII.  Clinical Impression  Pt presents to PT with mild deficits in activity tolerance, gait, balance, power. Pt is able to ambulate for household distances, reporting fatigue in lower extremities when ambulating, but states she is not far from baseline. Pt will benefit from continued acute PT services in an effort to improve activity tolerance and restore independence.      Recommendations for follow up therapy are one component of a multi-disciplinary discharge planning process, led by the attending physician.  Recommendations may be updated based on patient status, additional functional criteria and insurance authorization.  Follow Up Recommendations No PT follow up      Assistance Recommended at Discharge PRN  Patient can return home with the following  A little help with walking and/or transfers;A little help with bathing/dressing/bathroom;Assistance with cooking/housework;Assist for transportation;Help with stairs or ramp for entrance    Equipment Recommendations None recommended by PT  Recommendations for Other Services       Functional Status Assessment Patient has had a recent decline in their functional status and demonstrates the ability to make significant improvements in function in a reasonable and predictable amount of time.     Precautions / Restrictions Precautions Precautions: Fall Precaution Comments: monitor HR Restrictions Weight Bearing Restrictions: No      Mobility  Bed Mobility Overal bed mobility: Modified Independent              General bed mobility comments: HOB elevated    Transfers Overall transfer level: Needs assistance Equipment used: None Transfers: Sit to/from Stand Sit to Stand: Supervision                Ambulation/Gait Ambulation/Gait assistance: Min guard Gait Distance (Feet): 80 Feet Assistive device: None Gait Pattern/deviations: Step-through pattern Gait velocity: reduced Gait velocity interpretation: <1.31 ft/sec, indicative of household ambulator   General Gait Details: pt with slowed step-through gait, reduced stride length  Stairs            Wheelchair Mobility    Modified Rankin (Stroke Patients Only)       Balance Overall balance assessment: Needs assistance Sitting-balance support: No upper extremity supported, Feet supported Sitting balance-Leahy Scale: Good     Standing balance support: No upper extremity supported, During functional activity Standing balance-Leahy Scale: Fair                               Pertinent Vitals/Pain Pain Assessment Pain Assessment: No/denies pain    Home Living Family/patient expects to be discharged to:: Private residence Living Arrangements: Children Available Help at Discharge: Family;Available 24 hours/day Type of Home: House Home Access: Stairs to enter Entrance Stairs-Rails: Can reach both Entrance Stairs-Number of Steps: 3   Home Layout: Two level;Able to live on main level with bedroom/bathroom Home Equipment: Rollator (4 wheels);Cane - quad;Shower seat;Other (comment) (3 wheeled walker)      Prior Function Prior Level of Function : Needs assist             Mobility Comments: pt ambulates with quad cane in the home, utilizes a 3 wheeled  walker in the community ADLs Comments: pt requires assistance with cookinh and household chores     Hand Dominance   Dominant Hand: Right    Extremity/Trunk Assessment   Upper Extremity Assessment Upper Extremity Assessment: Overall WFL for tasks  assessed    Lower Extremity Assessment Lower Extremity Assessment: Generalized weakness    Cervical / Trunk Assessment Cervical / Trunk Assessment: Kyphotic  Communication   Communication: HOH  Cognition Arousal/Alertness: Awake/alert Behavior During Therapy: WFL for tasks assessed/performed Overall Cognitive Status: Within Functional Limits for tasks assessed                                          General Comments General comments (skin integrity, edema, etc.): mild tachycardia up to 122 observed with mobility, pt reports no SOB although does cite LE fatigue    Exercises     Assessment/Plan    PT Assessment Patient needs continued PT services  PT Problem List Decreased strength;Decreased activity tolerance;Decreased balance;Decreased mobility;Cardiopulmonary status limiting activity       PT Treatment Interventions DME instruction;Gait training;Stair training;Functional mobility training;Therapeutic activities;Therapeutic exercise;Balance training;Neuromuscular re-education;Patient/family education    PT Goals (Current goals can be found in the Care Plan section)  Acute Rehab PT Goals Patient Stated Goal: to go home PT Goal Formulation: With patient Time For Goal Achievement: 07/22/21 Potential to Achieve Goals: Good    Frequency Min 3X/week     Co-evaluation               AM-PAC PT "6 Clicks" Mobility  Outcome Measure Help needed turning from your back to your side while in a flat bed without using bedrails?: None Help needed moving from lying on your back to sitting on the side of a flat bed without using bedrails?: None Help needed moving to and from a bed to a chair (including a wheelchair)?: A Little Help needed standing up from a chair using your arms (e.g., wheelchair or bedside chair)?: A Little Help needed to walk in hospital room?: A Little Help needed climbing 3-5 steps with a railing? : Total 6 Click Score: 18    End of  Session   Activity Tolerance: Patient tolerated treatment well Patient left: in chair;with call bell/phone within reach Nurse Communication: Mobility status PT Visit Diagnosis: Other abnormalities of gait and mobility (R26.89);Muscle weakness (generalized) (M62.81)    Time: 4010-2725 PT Time Calculation (min) (ACUTE ONLY): 32 min   Charges:   PT Evaluation $PT Eval Low Complexity: [place], PT, DPT Acute Rehabilitation Office [phone]   Zenaida Niece 07/08/2021, 10:38 AM

## 2021-07-08 NOTE — Progress Notes (Signed)
Pt and friend at bedside state she is doing well on NRB and wants to remain off BIPAP if she can.  No distress noted at this time BIPAP remains at bedside for PRN.  Patient is aware if she has difficulty breathing throughout the night to have RN call RT.

## 2021-07-08 NOTE — Progress Notes (Signed)
Heart Failure Stewardship Pharmacist Progress Note   PCP: Shon Baton, MD PCP-Cardiologist: None    HPI:  88 yo F with PMH of CAD s/p CABG, HTN, afib, HLD, OSA, T2DM, and CKD II.  She presented to the ED on 6/27 with shortness of breath, O2 sat 70% at home, and cough. Also with burning sensations when urination - she was started on Macrobid for 7 days as outpatient on 6/26.   ECHO 6/28 with LVEF 35-40% (dropped from 50-55% in 11/2019), mild LVH, mild MR, RV ok.  Current HF Medications: Diuretic: furosemide 40 mg IV BID Beta Blocker: metoprolol XL 12.5 mg daily Other: Isordil 30 mg TID  Prior to admission HF Medications: Diuretic: furosemide 20 mg daily Beta blocker: metoprolol XL 12.5 mg BID Other: Imdur 30 mg BID  Pertinent Lab Values: Serum creatinine 1.27, BUN 20, Potassium 3.3, Sodium 138, Magnesium 1.8 BNP 501.1 A1c 7.8 Urinalysis clean for infection  Vital Signs: Weight: 162 lbs (admission weight: 165 lbs) Blood pressure: 120-140/60s  Heart rate: 80-110s - afib  I/O: -1.3L yesterday; net -1.3L  Medication Assistance / Insurance Benefits Check: Does the patient have prescription insurance?  Yes Type of insurance plan: [place]:  Prior to admission outpatient pharmacy: [facility] Is the patient willing to use [facility] at discharge? Yes Is the patient willing to transition their outpatient pharmacy to utilize a [facility] outpatient pharmacy?   Pending    Assessment: 1. Acute systolic CHF (LVEF 00-93%). NYHA class II symptoms. - Volume improved - consider stopping IV lasix. K low 3.3. 40 mEq x 1 given. Keep K>4 and Mag>2.  - Agree with restarting metoprolol XL 12.5 mg daily - was taking BID PTA - Consider starting Entresto 24/26 mg BID prior to discharge if BP and SCr allow - Consider starting spironolactone 25 mg daily  - Consider starting Jardiance 10 mg daily before discharge - preferred SGLT2i with [employer]. Hold off with ongoing  UTI treatment - UA clear. - Continue Isordil 30 mg TID   Plan: 1) Medication changes recommended at this time: - Start spironolactone 25 mg daily - Recheck magnesium with AM labs tomorrow  2) Patient assistance: - None needed - has [employer]  3)  Education  - To be completed prior to discharge  Kerby Nora, PharmD, BCPS Heart Failure Cytogeneticist Phone [phone]

## 2021-07-08 NOTE — Assessment & Plan Note (Addendum)
Hyperglycemia.  Patient was placed on insulin therapy during her hospitalization, basal and sliding scale.  Her fasting discharge glucose is 144 mg/dl.   She has been tolerating well po diet.  At the time of her discharge she will resume her home insulin regimen.  Will hold on glimperide to prevent hypoglycemia and continue with sitagliptin.   Continue with rosuvastatin.

## 2021-07-08 NOTE — H&P (View-Only) (Signed)
Subjective:  Feels much better, dyspnea has improved.  No further PND or orthopnea, slept slightly reclined as she normally sleeps at home without having to wake up at night with dyspnea.  Patient's daughter is present.  Intake/Output from previous day:  I/O last 3 completed shifts: In: 242.8 [I.V.:242.8] Out: 1250 [Urine:1250] Total I/O In: -  Out: 300 [Urine:300] Net IO Since Admission: -1,307.22 mL [07/08/21 1301]  Blood pressure (!) 138/55, pulse 96, temperature 98.4 F (36.9 C), temperature source Oral, resp. rate (!) 30, height 5' (1.524 m), weight 73.8 kg, SpO2 96 %. Physical Exam Vitals reviewed.  Constitutional:      Appearance: She is well-developed.  Neck:     Vascular: No JVD.  Cardiovascular:     Rate and Rhythm: Tachycardia present. Rhythm irregular.     Pulses: Intact distal pulses.          Carotid pulses are  on the right side with bruit and  on the left side with bruit.      Radial pulses are 2+ on the right side and 2+ on the left side.       Dorsalis pedis pulses are 1+ on the right side and 1+ on the left side.       Posterior tibial pulses are 1+ on the right side and 2+ on the left side.     Heart sounds: Murmur heard.     Systolic murmur is present at the upper right sternal border.     No gallop.  Pulmonary:     Effort: Pulmonary effort is normal. No accessory muscle usage or respiratory distress.     Breath sounds: Normal breath sounds. No wheezing, rhonchi or rales.  Abdominal:     General: Abdomen is flat.     Palpations: Abdomen is soft.  Musculoskeletal:     Right shoulder: Normal.     Right lower leg: Edema (trace ankle edema) present.     Left lower leg: Edema (trace ankle edema) present.     Lab Results: BMP BNP (last 3 results) Recent Labs    07/06/21 2330 07/08/21 0628  BNP 501.1* 275.2*    ProBNP (last 3 results) No results for input(s): "PROBNP" in the last 8760 hours.    Latest Ref Rng & Units 07/08/2021   11:06 AM  07/08/2021    6:28 AM 07/06/2021   11:30 PM  BMP  Glucose 70 - 99 mg/dL 225  96  278   BUN 8 - 23 mg/dL _0 Creatinine 0.44 - 1.00 mg/dL 1.35  1.27  1.16   Sodium 135 - 145 mmol/L 138  138  135   Potassium 3.5 - 5.1 mmol/L 3.5  3.3  4.6   Chloride 98 - 111 mmol/L 95  97  100   CO2 22 - 32 mmol/L 31  32  24   Calcium 8.9 - 10.3 mg/dL 8.5  8.4  8.7       Latest Ref Rng & Units 07/06/2021   11:30 PM 08/23/2018    4:34 AM 08/22/2018    1:51 PM  Hepatic Function  Total Protein 6.5 - 8.1 g/dL 6.9  6.0  6.6   Albumin 3.5 - 5.0 g/dL 3.1  2.9  3.2   AST 15 - 41 U/L 44  15  17   ALT 0 - 44 U/L _1 Alk Phosphatase 38 - 126 U/L 134  100  111  Total Bilirubin 0.3 - 1.2 mg/dL 1.4  0.4  0.5       Latest Ref Rng & Units 07/08/2021    6:28 AM 07/06/2021   11:30 PM 10/07/2019    8:12 PM  CBC  WBC 4.0 - 10.5 K/uL 9.8  19.3  7.2   Hemoglobin 12.0 - 15.0 g/dL 10.1  11.3  10.6   Hematocrit 36.0 - 46.0 % 31.6  36.6  34.6   Platelets 150 - 400 K/uL 253  298  240    Lipid Panel     Component Value Date/Time   CHOL 127 08/23/2018 0434   TRIG 358 (H) 08/23/2018 0434   HDL 27 (L) 08/23/2018 0434   CHOLHDL 4.7 08/23/2018 0434   VLDL 72 (H) 08/23/2018 0434   LDLCALC 28 08/23/2018 0434   Cardiac Panel (last 3 results) No results for input(s): "CKTOTAL", "CKMB", "TROPONINI", "RELINDX" in the last 72 hours.  HEMOGLOBIN A1C Lab Results  Component Value Date   HGBA1C 7.8 (H) 07/07/2021   MPG 177.16 07/07/2021   TSH No results for input(s): "TSH" in the last 8760 hours.  Scheduled Meds:  clopidogrel  75 mg Oral Daily   DULoxetine  60 mg Oral Daily   ezetimibe  10 mg Oral Daily   ferrous sulfate  325 mg Oral Daily   fesoterodine  8 mg Oral QPM   gabapentin  300 mg Oral BID   hydrOXYzine  25 mg Oral Daily   hydrOXYzine  50 mg Oral QHS   insulin aspart  0-5 Units Subcutaneous QHS   insulin aspart  0-9 Units Subcutaneous TID WC   insulin glargine-yfgn  10 Units Subcutaneous  Daily   isosorbide dinitrate  30 mg Oral BID   linagliptin  5 mg Oral Daily   metoprolol succinate  12.5 mg Oral BID   nitrofurantoin (macrocrystal-monohydrate)  100 mg Oral BID   pantoprazole  40 mg Oral Daily   rosuvastatin  10 mg Oral QAC supper   sodium chloride flush  3 mL Intravenous Q12H   Vibegron  75 mg Oral Daily   Continuous Infusions:  sodium chloride     heparin 1,000 Units/hr (07/08/21 0603)   PRN Meds:.sodium chloride, acetaminophen, fluticasone, loratadine, prochlorperazine, sodium chloride flush  Radiology:    [facility] Chest Port 1 View 07/07/2021 CLINICAL DATA:  Shortness of breath. EXAM: PORTABLE CHEST 1 VIEW COMPARISON:  10/07/2019 FINDINGS: Patient is post median sternotomy. Planted loop recorder in the left chest wall. Mild cardiomegaly with unchanged mediastinal contours. Moderate pulmonary edema. Small pleural effusions and fluid in the fissure. No pneumothorax. IMPRESSION: CHF with moderate pulmonary edema and small pleural effusions. Electronically Signed   By: Keith Rake M.D.   On: 07/07/2021 00:10     Cardiac Studies:    Coronary angiogram 03/05/2021: LM: Trifurcates.  Ostium 80 to 90% stenosis. LAD: Heavy calcification is noted with ostial 90% stenosis.  Complete filling from the LIMA is noted. LCx: Large vessel, no significant disease. RI: Large vessel, bifurcating vessel, ostial 80 to 90% stenosis.  Competitive filling from the vein graft is seen. RCA: Large-caliber vessel and a dominant vessel.  70% stenosis in the ostium and mid RCA has a sequential 80 to 90% stenosis. LIMA to LAD patent. SVG to RI widely patent. SVG to RCA severely diffusely diseased and nonfunctional graft.   Successful IVUS guided PCI of the proximal to distal right coronary artery using 3.5 x 38 and a 3.5 x 38 mm Synergy DES.   Echocardiogram  07/07/2021:   1. Left ventricular ejection fraction, by estimation, is 35 to 40%. The left ventricle has moderately decreased function.  The left ventricle demonstrates regional wall motion abnormalities (see scoring diagram/findings for description). There is mild  left ventricular hypertrophy. Left ventricular diastolic parameters are indeterminate. Elevated left ventricular end-diastolic pressure. There is severe of the left ventricular, entire anterior wall. There is mild of the left ventricular, mid-apical  anterolateral wall.  2. Right ventricular systolic function is normal. The right ventricular size is normal. There is normal pulmonary artery systolic pressure. The estimated right ventricular systolic pressure is 16.1 mmHg.  3. Left atrial size was mildly dilated.  4. The mitral valve is normal in structure. Mild mitral valve regurgitation.  5. The aortic valve is tricuspid. Aortic valve regurgitation is mild to moderate. Aortic valve sclerosis/calcification is present, without any evidence of aortic stenosis.  6. The inferior vena cava is normal in size with <50% respiratory variability, suggesting right atrial pressure of 8 mmHg.   Comparison(s): Changes from prior study are noted. Compared to 08/23/2018, LVEF 60-65% without regional wall motion abnormality. Mild MR and TR new.  Compared to 11/27/2019, LVEF is 50 to 55%.  There was no wall motion abnormality.  Otherwise no significant change.   EKG:   EKG 07/06/2021: Probably normal sinus rhythm at rate of 99 bpm, left atrial abnormality.  Right bundle branch block.  ST depression V4 to V6, cannot exclude lateral ischemia.  Compared to the EKG done on 03/11/2021, no significant change, inferior ST segment depression not present.   Assessment    MESA JANUS is a 88 y.o. 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  Admitted with unstable angina and underwent coronary angiography on 03/05/2021 and overlapping stent plantation to the right coronary artery native as the SVG to RCA was functionally occluded.  Left grafts are widely patent.    1.  NSTEMI 2.  Coronary artery disease of the native vessel with unstable angina pectoris 3.  Acute pulmonary edema 4.  Paroxysmal atrial fibrillation now has been persistent atrial fibrillation since hospital admission             CHA2DS2-VASc Score is 8.  Yearly risk of stroke: > 10% (F, A, HTN, DM, Vasc Dz, CHF).  Score of 1=0.6; 2=2.2; 3=3.2; 4=4.8; 5=7.2; 6=9.8; 7=>9.8) -(CHF; HTN; vasc disease DM,  Female = 1; Age <65 =0; 65-74 = 1,  >75 =2; stroke/embolism= 2).   Recommendations:    Patient has responded well to diuresis, she is now off of BiPAP.  She feels well and is no further in acute respiratory distress.  I reviewed the telemetry, she continues to have persistent atrial fibrillation although rate controlled in the upper limit of normal heart rates.  She would benefit from cardioversion to improve her diastolic function.  I reviewed the EKG, wall motion abnormality noted on the echocardiogram, which is new and also positive cardiac markers which clearly increased since admission.  Hence suspect she may have progression of disease in either native vessels or bypass graft, most probably suspect LIMA to LAD to have a problem.  I discussed with the patient and her daughter at the  bedside regarding proceeding with cardiac catheterization.  Patient does develop mild acute renal insufficiency stage IIIa due to diuresis, but do not suspect this would be inability.  We will repeat baseline serum creatinine around 1 PM today prior to finalization regarding cardiac catheterization.  Schedule for cardiac catheterization, and possible angioplasty. We discussed regarding risks, benefits, alternatives to this including  stress testing, CTA and continued medical therapy. Patient wants to proceed. Understands <1-2% risk of death, stroke, MI, urgent CABG, bleeding, infection, renal failure but not limited to these.  I have also discussed with him regarding proceeding with direct-current cardioversion, as she has already had breakfast this morning, will schedule it for tomorrow depending upon how cardiac catheterization proceeds. Schedule for Direct current cardioversion. I have discussed regarding risks benefits rate control vs rhythm control with the patient. Patient understands cardiac arrest and need for CPR, aspiration pneumonia, but not limited to these. Patient is willing.    Spironolactone has been on hold, Eliquis is on hold since hospital admission and presently on IV heparin.  We will also avoid ACE inhibitor's or ARB in view of contrast exposure coming up soon.  All questions answered.   Adrian Prows, MD, [facility] 07/08/2021, 1:01 PM Office: [phone] Fax: [phone] Pager: [phone]

## 2021-07-08 NOTE — Progress Notes (Signed)
  Amiodarone Drug - Drug Interaction Consult Note  Recommendations: Monitor for bradycardia if start on amio while on Toprol  Amiodarone is metabolized by the cytochrome P450 system and therefore has the potential to cause many drug interactions. Amiodarone has an average plasma half-life of 50 days (range 20 to 100 days).   There is potential for drug interactions to occur several weeks or months after stopping treatment and the onset of drug interactions may be slow after initiating amiodarone.   '[x]'$  Statins: Increased risk of myopathy. Simvastatin- restrict dose to '20mg'$  daily. Other statins: counsel patients to report any muscle pain or weakness immediately.  '[]'$  Anticoagulants: Amiodarone can increase anticoagulant effect. Consider warfarin dose reduction. Patients should be monitored closely and the dose of anticoagulant altered accordingly, remembering that amiodarone levels take several weeks to stabilize.  '[]'$  Antiepileptics: Amiodarone can increase plasma concentration of phenytoin, the dose should be reduced. Note that small changes in phenytoin dose can result in large changes in levels. Monitor patient and counsel on signs of toxicity.  '[x]'$  Beta blockers: increased risk of bradycardia, AV block and myocardial depression. Sotalol - avoid concomitant use.  '[]'$   Calcium channel blockers (diltiazem and verapamil): increased risk of bradycardia, AV block and myocardial depression.  '[]'$   Cyclosporine: Amiodarone increases levels of cyclosporine. Reduced dose of cyclosporine is recommended.  '[]'$  Digoxin dose should be halved when amiodarone is started.  '[]'$  Diuretics: increased risk of cardiotoxicity if hypokalemia occurs.  '[]'$  Oral hypoglycemic agents (glyburide, glipizide, glimepiride): increased risk of hypoglycemia. Patient's glucose levels should be monitored closely when initiating amiodarone therapy.   '[]'$  Drugs that prolong the QT interval:  Torsades de pointes risk may be increased  with concurrent use - avoid if possible.  Monitor QTc, also keep magnesium/potassium WNL if concurrent therapy can't be avoided.  Antibiotics: e.g. fluoroquinolones, erythromycin.  Antiarrhythmics: e.g. quinidine, procainamide, disopyramide, sotalol.  Antipsychotics: e.g. phenothiazines, haloperidol.   Lithium, tricyclic antidepressants, and methadone.

## 2021-07-08 NOTE — Progress Notes (Signed)
Progress Note   Patient: Linda Oliver EPP:[id number] DOB: Oct 05, 1932 DOA: 07/06/2021     1 DOS: the patient was seen and examined on 07/08/2021   Brief hospital course: As per H&P written by Dr. Roosevelt Locks on 07/07/21  Linda Oliver is a 88 y.o. female with medical history significant of CAD status post CABG and stenting, HTN, PAF on Eliquis HLD, OSA on CPAP at bedtime, IDDM, cKd stage II, came with cough and SOB.   Started to have shortness of breath and dry cough 4 days ago and gradually getting worse.  Denies any fever chills no chest pains.  Last week patient also started to have burning sensations on urination and PCP started patient on nitrofurantoin and patient just completed 2 days worth.  Family called EMS, EMS arrived and found patient O2 saturation in the 70s and patient was put on BiPAP.   ED Course: O2 saturation 97% on BiPAP 40%, no tachycardia no hypotensive.  Blood pressure elevated mildly.  Chest x-ray showed pulmonary edema.   Assessment and Plan: * Acute on chronic diastolic CHF (congestive heart failure) (HCC) - Excellent response of diuresis and the use of BiPAP at time of admission -Now back to chronic oxygen supplementation reporting breathing essentially back to baseline. -Trace edema appreciated on her legs but no significant crackles and patient tolerating being almost completely flat in bed. -Patient denies chest pain. -Good urine output reported -Continue to follow cardiology service recommendation and plans for GDMT.  Paroxysmal atrial fibrillation (HCC) -chronically on eliquis and since admission with sustained. A. Fib. -currently receiving heparin drip in anticipation for cardiac cath on 6/30 -per cardiology possible cardioversion later today.  Hypertensive heart disease - Blood pressure currently stable -Continue current antihypertensive agents.  Type 2 diabetes mellitus with vascular disease ([place]) -continue SSI, semglee and tradjenta. -follow CBG's.  GERD  (gastroesophageal reflux disease) - Continue PPI.  Obesity (BMI 30-39.9) - Low calorie diet and portion control discussed with patient -Body mass index is 31.78 kg/m.   Hyperlipidemia -continue statin  NSTEMI (non-ST elevated myocardial infarction) ([place]) - Currently chest pain-free -After discussing with cardiology planning for cardiac cath on 07/09/2021. -Continue IV heparin, Imdur, metoprolol and Crestor.  Prolonged QT interval - Continue telemetry monitoring -Follow electrolytes, replete as needed. -Avoid medications that can further prolong QT.  OSA on CPAP - Continue CPAP nightly.  Hypokalemia: -In the setting of diuresis -Continue to follow trend and replete as needed.  Subjective:  No chest pain, no palpitations; reported breathing at rest is back to baseline.  Still slightly short winded with activity.  Sustained atrial fibrillation appreciated on telemetry.  No nausea or vomiting reported.  Physical Exam: Vitals:   07/08/21 0614 07/08/21 0700 07/08/21 0726 07/08/21 0840  BP: (!) 145/45  (!) 138/55   Pulse: 97  96   Resp: 20  (!) 30   Temp: 98 F (36.7 C)  98.4 F (36.9 C)   TempSrc: Oral  Oral   SpO2: 96% 98% 97% 96%  Weight: 73.8 kg     Height:       General exam: Alert, awake, oriented x 3; no nausea, no vomiting, no chest pain.  Still short winded with activity. Respiratory system: Decreased breath sounds at the bases, no using accessory muscles.  Positive rhonchi. Cardiovascular system: Irregular, no rubs, no gallops, no JVD appreciated on exam..   Gastrointestinal system: Abdomen is nondistended, soft and nontender. No organomegaly or masses felt. Normal bowel sounds heard. Central nervous system: Alert  and oriented. No focal neurological deficits. Extremities: No cyanosis or clubbing; trace edema appreciated bilaterally. Skin: No petechiae. Psychiatry: Judgement and insight appear normal. Mood & affect appropriate.   Data Reviewed: Basic metabolic  panel: Sodium 165, potassium 3.3, chloride 97, BUN 20, creatinine 1.27 CBC: WBCs 9.8, hemoglobin 10.1 and platelet count 253K   Family Communication: No family at bedside.  Disposition: Status is: Inpatient Remains inpatient appropriate because: Receiving diuresis and optimization of medications; with plans for cardioversion and heart cath on 07/09/2021.   Planned Discharge Destination: Home   Author: Barton Dubois, MD 07/08/2021 1:59 PM  For on call review [email or web address].

## 2021-07-08 NOTE — Assessment & Plan Note (Addendum)
Continue blood pressure control with metoprolol and isosorbide.

## 2021-07-08 NOTE — Assessment & Plan Note (Addendum)
S/P cardioversion on 07/09/21  Continue rate control with metoprolol and rhythm control with amiodarone.  Continue anticoagulation with apixaban.   Prolonged Qtc to 511 in the setting of right bundle branch block.

## 2021-07-08 NOTE — Progress Notes (Signed)
Subjective:  Feels much better, dyspnea has improved.  No further PND or orthopnea, slept slightly reclined as she normally sleeps at home without having to wake up at night with dyspnea.  Patient's daughter is present.  Intake/Output from previous day:  I/O last 3 completed shifts: In: 242.8 [I.V.:242.8] Out: 1250 [Urine:1250] Total I/O In: -  Out: 300 [Urine:300] Net IO Since Admission: -1,307.22 mL [07/08/21 1301]  Blood pressure (!) 138/55, pulse 96, temperature 98.4 F (36.9 C), temperature source Oral, resp. rate (!) 30, height 5' (1.524 m), weight 73.8 kg, SpO2 96 %. Physical Exam Vitals reviewed.  Constitutional:      Appearance: She is well-developed.  Neck:     Vascular: No JVD.  Cardiovascular:     Rate and Rhythm: Tachycardia present. Rhythm irregular.     Pulses: Intact distal pulses.          Carotid pulses are  on the right side with bruit and  on the left side with bruit.      Radial pulses are 2+ on the right side and 2+ on the left side.       Dorsalis pedis pulses are 1+ on the right side and 1+ on the left side.       Posterior tibial pulses are 1+ on the right side and 2+ on the left side.     Heart sounds: Murmur heard.     Systolic murmur is present at the upper right sternal border.     No gallop.  Pulmonary:     Effort: Pulmonary effort is normal. No accessory muscle usage or respiratory distress.     Breath sounds: Normal breath sounds. No wheezing, rhonchi or rales.  Abdominal:     General: Abdomen is flat.     Palpations: Abdomen is soft.  Musculoskeletal:     Right shoulder: Normal.     Right lower leg: Edema (trace ankle edema) present.     Left lower leg: Edema (trace ankle edema) present.     Lab Results: BMP BNP (last 3 results) Recent Labs    07/06/21 2330 07/08/21 0628  BNP 501.1* 275.2*    ProBNP (last 3 results) No results for input(s): "PROBNP" in the last 8760 hours.    Latest Ref Rng & Units 07/08/2021   11:06 AM  07/08/2021    6:28 AM 07/06/2021   11:30 PM  BMP  Glucose 70 - 99 mg/dL 225  96  278   BUN 8 - 23 mg/dL _0 Creatinine 0.44 - 1.00 mg/dL 1.35  1.27  1.16   Sodium 135 - 145 mmol/L 138  138  135   Potassium 3.5 - 5.1 mmol/L 3.5  3.3  4.6   Chloride 98 - 111 mmol/L 95  97  100   CO2 22 - 32 mmol/L 31  32  24   Calcium 8.9 - 10.3 mg/dL 8.5  8.4  8.7       Latest Ref Rng & Units 07/06/2021   11:30 PM 08/23/2018    4:34 AM 08/22/2018    1:51 PM  Hepatic Function  Total Protein 6.5 - 8.1 g/dL 6.9  6.0  6.6   Albumin 3.5 - 5.0 g/dL 3.1  2.9  3.2   AST 15 - 41 U/L 44  15  17   ALT 0 - 44 U/L _1 Alk Phosphatase 38 - 126 U/L 134  100  111  Total Bilirubin 0.3 - 1.2 mg/dL 1.4  0.4  0.5       Latest Ref Rng & Units 07/08/2021    6:28 AM 07/06/2021   11:30 PM 10/07/2019    8:12 PM  CBC  WBC 4.0 - 10.5 K/uL 9.8  19.3  7.2   Hemoglobin 12.0 - 15.0 g/dL 10.1  11.3  10.6   Hematocrit 36.0 - 46.0 % 31.6  36.6  34.6   Platelets 150 - 400 K/uL 253  298  240    Lipid Panel     Component Value Date/Time   CHOL 127 08/23/2018 0434   TRIG 358 (H) 08/23/2018 0434   HDL 27 (L) 08/23/2018 0434   CHOLHDL 4.7 08/23/2018 0434   VLDL 72 (H) 08/23/2018 0434   LDLCALC 28 08/23/2018 0434   Cardiac Panel (last 3 results) No results for input(s): "CKTOTAL", "CKMB", "TROPONINI", "RELINDX" in the last 72 hours.  HEMOGLOBIN A1C Lab Results  Component Value Date   HGBA1C 7.8 (H) 07/07/2021   MPG 177.16 07/07/2021   TSH No results for input(s): "TSH" in the last 8760 hours.  Scheduled Meds:  clopidogrel  75 mg Oral Daily   DULoxetine  60 mg Oral Daily   ezetimibe  10 mg Oral Daily   ferrous sulfate  325 mg Oral Daily   fesoterodine  8 mg Oral QPM   gabapentin  300 mg Oral BID   hydrOXYzine  25 mg Oral Daily   hydrOXYzine  50 mg Oral QHS   insulin aspart  0-5 Units Subcutaneous QHS   insulin aspart  0-9 Units Subcutaneous TID WC   insulin glargine-yfgn  10 Units Subcutaneous  Daily   isosorbide dinitrate  30 mg Oral BID   linagliptin  5 mg Oral Daily   metoprolol succinate  12.5 mg Oral BID   nitrofurantoin (macrocrystal-monohydrate)  100 mg Oral BID   pantoprazole  40 mg Oral Daily   rosuvastatin  10 mg Oral QAC supper   sodium chloride flush  3 mL Intravenous Q12H   Vibegron  75 mg Oral Daily   Continuous Infusions:  sodium chloride     heparin 1,000 Units/hr (07/08/21 0603)   PRN Meds:.sodium chloride, acetaminophen, fluticasone, loratadine, prochlorperazine, sodium chloride flush  Radiology:    [facility] Chest Port 1 View 07/07/2021 CLINICAL DATA:  Shortness of breath. EXAM: PORTABLE CHEST 1 VIEW COMPARISON:  10/07/2019 FINDINGS: Patient is post median sternotomy. Planted loop recorder in the left chest wall. Mild cardiomegaly with unchanged mediastinal contours. Moderate pulmonary edema. Small pleural effusions and fluid in the fissure. No pneumothorax. IMPRESSION: CHF with moderate pulmonary edema and small pleural effusions. Electronically Signed   By: Keith Rake M.D.   On: 07/07/2021 00:10     Cardiac Studies:    Coronary angiogram 03/05/2021: LM: Trifurcates.  Ostium 80 to 90% stenosis. LAD: Heavy calcification is noted with ostial 90% stenosis.  Complete filling from the LIMA is noted. LCx: Large vessel, no significant disease. RI: Large vessel, bifurcating vessel, ostial 80 to 90% stenosis.  Competitive filling from the vein graft is seen. RCA: Large-caliber vessel and a dominant vessel.  70% stenosis in the ostium and mid RCA has a sequential 80 to 90% stenosis. LIMA to LAD patent. SVG to RI widely patent. SVG to RCA severely diffusely diseased and nonfunctional graft.   Successful IVUS guided PCI of the proximal to distal right coronary artery using 3.5 x 38 and a 3.5 x 38 mm Synergy DES.   Echocardiogram  07/07/2021:   1. Left ventricular ejection fraction, by estimation, is 35 to 40%. The left ventricle has moderately decreased function.  The left ventricle demonstrates regional wall motion abnormalities (see scoring diagram/findings for description). There is mild  left ventricular hypertrophy. Left ventricular diastolic parameters are indeterminate. Elevated left ventricular end-diastolic pressure. There is severe of the left ventricular, entire anterior wall. There is mild of the left ventricular, mid-apical  anterolateral wall.  2. Right ventricular systolic function is normal. The right ventricular size is normal. There is normal pulmonary artery systolic pressure. The estimated right ventricular systolic pressure is 16.1 mmHg.  3. Left atrial size was mildly dilated.  4. The mitral valve is normal in structure. Mild mitral valve regurgitation.  5. The aortic valve is tricuspid. Aortic valve regurgitation is mild to moderate. Aortic valve sclerosis/calcification is present, without any evidence of aortic stenosis.  6. The inferior vena cava is normal in size with <50% respiratory variability, suggesting right atrial pressure of 8 mmHg.   Comparison(s): Changes from prior study are noted. Compared to 08/23/2018, LVEF 60-65% without regional wall motion abnormality. Mild MR and TR new.  Compared to 11/27/2019, LVEF is 50 to 55%.  There was no wall motion abnormality.  Otherwise no significant change.   EKG:   EKG 07/06/2021: Probably normal sinus rhythm at rate of 99 bpm, left atrial abnormality.  Right bundle branch block.  ST depression V4 to V6, cannot exclude lateral ischemia.  Compared to the EKG done on 03/11/2021, no significant change, inferior ST segment depression not present.   Assessment    Linda Oliver is a 88 y.o. 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  Admitted with unstable angina and underwent coronary angiography on 03/05/2021 and overlapping stent plantation to the right coronary artery native as the SVG to RCA was functionally occluded.  Left grafts are widely patent.    1.  NSTEMI 2.  Coronary artery disease of the native vessel with unstable angina pectoris 3.  Acute pulmonary edema 4.  Paroxysmal atrial fibrillation now has been persistent atrial fibrillation since hospital admission             CHA2DS2-VASc Score is 8.  Yearly risk of stroke: > 10% (F, A, HTN, DM, Vasc Dz, CHF).  Score of 1=0.6; 2=2.2; 3=3.2; 4=4.8; 5=7.2; 6=9.8; 7=>9.8) -(CHF; HTN; vasc disease DM,  Female = 1; Age <65 =0; 65-74 = 1,  >75 =2; stroke/embolism= 2).   Recommendations:    Patient has responded well to diuresis, she is now off of BiPAP.  She feels well and is no further in acute respiratory distress.  I reviewed the telemetry, she continues to have persistent atrial fibrillation although rate controlled in the upper limit of normal heart rates.  She would benefit from cardioversion to improve her diastolic function.  I reviewed the EKG, wall motion abnormality noted on the echocardiogram, which is new and also positive cardiac markers which clearly increased since admission.  Hence suspect she may have progression of disease in either native vessels or bypass graft, most probably suspect LIMA to LAD to have a problem.  I discussed with the patient and her daughter at the  bedside regarding proceeding with cardiac catheterization.  Patient does develop mild acute renal insufficiency stage IIIa due to diuresis, but do not suspect this would be inability.  We will repeat baseline serum creatinine around 1 PM today prior to finalization regarding cardiac catheterization.  Schedule for cardiac catheterization, and possible angioplasty. We discussed regarding risks, benefits, alternatives to this including  stress testing, CTA and continued medical therapy. Patient wants to proceed. Understands <1-2% risk of death, stroke, MI, urgent CABG, bleeding, infection, renal failure but not limited to these.  I have also discussed with him regarding proceeding with direct-current cardioversion, as she has already had breakfast this morning, will schedule it for tomorrow depending upon how cardiac catheterization proceeds. Schedule for Direct current cardioversion. I have discussed regarding risks benefits rate control vs rhythm control with the patient. Patient understands cardiac arrest and need for CPR, aspiration pneumonia, but not limited to these. Patient is willing.    Spironolactone has been on hold, Eliquis is on hold since hospital admission and presently on IV heparin.  We will also avoid ACE inhibitor's or ARB in view of contrast exposure coming up soon.  All questions answered.   Adrian Prows, MD, [facility] 07/08/2021, 1:01 PM Office: [phone] Fax: [phone] Pager: [phone]

## 2021-07-08 NOTE — Assessment & Plan Note (Addendum)
-  Continue telemetry monitoring -Follow electrolytes, replete as needed.  Goal is for potassium >4 and magnesium as close as possible to 2.. -Avoid medications that can further prolong QT. -continue telemetry.

## 2021-07-08 NOTE — Assessment & Plan Note (Signed)
Continue PPI ?

## 2021-07-08 NOTE — Progress Notes (Signed)
Pt was started on nitrofurantoin prior to admission on 6/26 for UTI. Due to her age, Crcl<30 ml/min. D/w Dr. Dyann Kief and we ill change her nitrofurantoin to Keflex '250mg'$  BID x5 doses to complete course.   Onnie Boer, PharmD, BCIDP, AAHIVP, CPP Infectious Disease Pharmacist 07/08/2021 2:31 PM

## 2021-07-08 NOTE — Assessment & Plan Note (Signed)
-   Low calorie diet and portion control discussed with patient -Body mass index is 31.78 kg/m.

## 2021-07-08 NOTE — Assessment & Plan Note (Addendum)
Patient was placed on heparin for medical therapy. Currently is chest pain free.   Complete ischemic work up today with cardiac catheterization.

## 2021-07-08 NOTE — Transfer of Care (Signed)
Immediate Anesthesia Transfer of Care Note  Patient: Linda Oliver  Procedure(s) Performed: CARDIOVERSION  Patient Location: PACU  Anesthesia Type:General  Level of Consciousness: awake, alert  and oriented  Airway & Oxygen Therapy: Patient Spontanous Breathing and Patient connected to face mask oxygen  Post-op Assessment: Report given to RN and Post -op Vital signs reviewed and stable  Post vital signs: Reviewed and stable  Last Vitals:  Vitals Value Taken Time  BP 144/46 07/08/21 1654  Temp    Pulse 85 07/08/21 1659  Resp 32 07/08/21 1659  SpO2 98 % 07/08/21 1659  Vitals shown include unvalidated device data.  Last Pain:  Vitals:   07/08/21 1620  TempSrc:   PainSc: 0-No pain         Complications: No notable events documented.

## 2021-07-08 NOTE — CV Procedure (Addendum)
Direct current cardioversion 07/08/2021 4:52 PM  Indication symptomatic A. Fibrillation.  Procedure: Using 30 mg of IV Propofol  for achieving deep sedation, synchronized direct current cardioversion performed. Patient was delivered with 120 Joules of electricity X 1 with success to NSR. Patient tolerated the procedure well. No immediate complication noted.    Adrian Prows, MD, [facility] 07/08/2021, 4:52 PM Office: [phone] Fax: [phone] Pager: [phone]    Spoke to her daughter Freda Munro. Patient also feeling much better post cardioversion, HR down from 120-140 to 80/min. Continue Amio. Temp 101.2. D/W primary team. Orders placed by them for work up.  JG

## 2021-07-08 NOTE — Assessment & Plan Note (Addendum)
Patient declined Cpap at night.  Use as needed as tolerated.

## 2021-07-08 NOTE — Assessment & Plan Note (Deleted)
-   Excellent response of diuresis and the use of BiPAP at time of admission. -further diuresis provided on 6/29 afternoon -after cardioversion resp distress subsided (questionable symptoms/exacerbation of CHF driven by arrhythmia) -Now back to chronic oxygen supplementation reporting breathing essentially back to baseline and demonstrating good saturation.. -Trace edema appreciated on her legs but not significant crackles and patient tolerating being almost completely flat in bed. -Patient denies chest pain. -Good urine output reported -Continue to follow cardiology service recommendation and plans for GDMT. -renal function limiting meds.

## 2021-07-08 NOTE — Assessment & Plan Note (Signed)
continue statin

## 2021-07-08 NOTE — Anesthesia Preprocedure Evaluation (Signed)
Anesthesia Evaluation  Patient identified by MRN, date of birth, ID band Patient awake    Reviewed: Allergy & Precautions, H&P , NPO status , Patient's Chart, lab work & pertinent test results, reviewed documented beta blocker date and time   History of Anesthesia Complications Negative for: history of anesthetic complications  Airway Mallampati: II  TM Distance: >3 FB Neck ROM: Full    Dental  (+) Teeth Intact, Dental Advisory Given   Pulmonary shortness of breath, sleep apnea and Oxygen sleep apnea , neg COPD, former smoker,     + decreased breath sounds+ wheezing      Cardiovascular hypertension, + angina (no chest pain in years) + CAD, + Past MI, + CABG and +CHF  + dysrhythmias Atrial Fibrillation (-) Valvular Problems/Murmurs Rhythm:Irregular Rate:Tachycardia  '11 stress test: no ischemia, EF 68%   Neuro/Psych neg Headaches, Seizures -,  PSYCHIATRIC DISORDERS Anxiety Depression  Neuromuscular disease (peripheral neuropathy) CVA    GI/Hepatic Neg liver ROS, GERD  Medicated and Controlled,  Endo/Other  diabetes, Well Controlled, Type 2, Oral Hypoglycemic AgentsMorbid obesity  Renal/GU Renal InsufficiencyRenal diseaseLab Results      Component                Value               Date                      CREATININE               1.35 (H)            07/08/2021                Musculoskeletal  (+) Arthritis ,   Abdominal   Peds  Hematology  (+) Blood dyscrasia, anemia , Lab Results      Component                Value               Date                      WBC                      9.8                 07/08/2021                HGB                      10.1 (L)            07/08/2021                HCT                      31.6 (L)            07/08/2021                MCV                      82.9                07/08/2021                PLT  253                 07/08/2021              Anesthesia  Other Findings amio gtt, heparin gtt  Reproductive/Obstetrics                             Anesthesia Physical Anesthesia Plan  ASA: 4  Anesthesia Plan: General   Post-op Pain Management: Minimal or no pain anticipated   Induction: Intravenous  PONV Risk Score and Plan: 3 and Treatment may vary due to age or medical condition  Airway Management Planned: Simple Face Mask and Natural Airway  Additional Equipment:   Intra-op Plan:   Post-operative Plan:   Informed Consent: I have reviewed the patients History and Physical, chart, labs and discussed the procedure including the risks, benefits and alternatives for the proposed anesthesia with the patient or authorized representative who has indicated his/her understanding and acceptance.     Dental advisory given  Plan Discussed with: CRNA  Anesthesia Plan Comments:         Anesthesia Quick Evaluation

## 2021-07-09 ENCOUNTER — Inpatient Hospital Stay (HOSPITAL_COMMUNITY): Payer: Medicare Other

## 2021-07-09 ENCOUNTER — Encounter (HOSPITAL_COMMUNITY): Payer: Self-pay | Admitting: Cardiology

## 2021-07-09 DIAGNOSIS — I119 Hypertensive heart disease without heart failure: Secondary | ICD-10-CM | POA: Diagnosis not present

## 2021-07-09 DIAGNOSIS — I5033 Acute on chronic diastolic (congestive) heart failure: Secondary | ICD-10-CM | POA: Diagnosis not present

## 2021-07-09 DIAGNOSIS — E782 Mixed hyperlipidemia: Secondary | ICD-10-CM | POA: Diagnosis not present

## 2021-07-09 DIAGNOSIS — K219 Gastro-esophageal reflux disease without esophagitis: Secondary | ICD-10-CM | POA: Diagnosis not present

## 2021-07-09 DIAGNOSIS — N390 Urinary tract infection, site not specified: Secondary | ICD-10-CM | POA: Diagnosis present

## 2021-07-09 LAB — LIPID PANEL
Cholesterol: 81 mg/dL (ref 0–200)
HDL: 31 mg/dL — ABNORMAL LOW (ref >40)
LDL Cholesterol: 35 mg/dL (ref 0–99)
Total CHOL/HDL Ratio: 2.6 RATIO
Triglycerides: 76 mg/dL (ref <150)
VLDL: 15 mg/dL (ref 0–40)

## 2021-07-09 LAB — GLUCOSE, CAPILLARY
Glucose-Capillary: 193 mg/dL — ABNORMAL HIGH (ref 70–99)
Glucose-Capillary: 252 mg/dL — ABNORMAL HIGH (ref 70–99)
Glucose-Capillary: 264 mg/dL — ABNORMAL HIGH (ref 70–99)
Glucose-Capillary: 177 mg/dL — ABNORMAL HIGH (ref 70–99)

## 2021-07-09 LAB — BASIC METABOLIC PANEL
Anion gap: 11 (ref 5–15)
BUN: 23 mg/dL (ref 8–23)
CO2: 29 mmol/L (ref 22–32)
Calcium: 8.5 mg/dL — ABNORMAL LOW (ref 8.9–10.3)
Chloride: 96 mmol/L — ABNORMAL LOW (ref 98–111)
Creatinine, Ser: 1.43 mg/dL — ABNORMAL HIGH (ref 0.44–1.00)
GFR, Estimated: 36 mL/min — ABNORMAL LOW (ref >60)
Glucose, Bld: 179 mg/dL — ABNORMAL HIGH (ref 70–99)
Potassium: 4.4 mmol/L (ref 3.5–5.1)
Sodium: 136 mmol/L (ref 135–145)

## 2021-07-09 LAB — CBC
HCT: 32.8 % — ABNORMAL LOW (ref 36.0–46.0)
Hemoglobin: 10.2 g/dL — ABNORMAL LOW (ref 12.0–15.0)
MCH: 26.1 pg (ref 26.0–34.0)
MCHC: 31.1 g/dL (ref 30.0–36.0)
MCV: 83.9 fL (ref 80.0–100.0)
Platelets: 279 10*3/uL (ref 150–400)
RBC: 3.91 MIL/uL (ref 3.87–5.11)
RDW: 15.2 % (ref 11.5–15.5)
WBC: 15.2 10*3/uL — ABNORMAL HIGH (ref 4.0–10.5)
nRBC: 0.2 % (ref 0.0–0.2)

## 2021-07-09 LAB — APTT: aPTT: 65 seconds — ABNORMAL HIGH (ref 24–36)

## 2021-07-09 LAB — BRAIN NATRIURETIC PEPTIDE: B Natriuretic Peptide: 205.2 pg/mL — ABNORMAL HIGH (ref 0.0–100.0)

## 2021-07-09 LAB — MAGNESIUM: Magnesium: 1.8 mg/dL (ref 1.7–2.4)

## 2021-07-09 LAB — HEPARIN LEVEL (UNFRACTIONATED): Heparin Unfractionated: >1.1 IU/mL — ABNORMAL HIGH (ref 0.30–0.70)

## 2021-07-09 MED ORDER — INSULIN GLARGINE-YFGN 100 UNIT/ML ~~LOC~~ SOLN
13.0000 [IU] | Freq: Every day | SUBCUTANEOUS | Status: DC
  Administered 2021-07-10 – 2021-07-13 (×4): 13 [IU] via SUBCUTANEOUS
  Filled 2021-07-09 (×4): qty 0.13

## 2021-07-09 MED ORDER — AMIODARONE HCL 200 MG PO TABS: 200.0000 mg | ORAL_TABLET | Freq: Two times a day (BID) | ORAL | Status: DC

## 2021-07-09 MED ORDER — EMPAGLIFLOZIN 10 MG PO TABS
10.0000 mg | ORAL_TABLET | Freq: Every day | ORAL | Status: DC
  Administered 2021-07-11 – 2021-07-13 (×3): 10 mg via ORAL
  Filled 2021-07-09 (×3): qty 1

## 2021-07-09 MED ORDER — AMIODARONE HCL 200 MG PO TABS
200.0000 mg | ORAL_TABLET | Freq: Two times a day (BID) | ORAL | Status: DC
  Administered 2021-07-09 – 2021-07-13 (×8): 200 mg via ORAL
  Filled 2021-07-09 (×8): qty 1

## 2021-07-09 MED ORDER — EMPAGLIFLOZIN 10 MG PO TABS: 10.0000 mg | ORAL_TABLET | Freq: Every day | ORAL | Status: DC

## 2021-07-09 MED ORDER — FUROSEMIDE 40 MG PO TABS
40.0000 mg | ORAL_TABLET | Freq: Every day | ORAL | Status: DC
  Administered 2021-07-09 – 2021-07-13 (×5): 40 mg via ORAL
  Filled 2021-07-09 (×5): qty 1

## 2021-07-09 MED ORDER — APIXABAN 5 MG PO TABS
5.0000 mg | ORAL_TABLET | Freq: Two times a day (BID) | ORAL | Status: DC
  Administered 2021-07-09 – 2021-07-11 (×4): 5 mg via ORAL
  Filled 2021-07-09 (×4): qty 1

## 2021-07-09 MED ORDER — MAGNESIUM SULFATE 2 GM/50ML IV SOLN
2.0000 g | Freq: Once | INTRAVENOUS | Status: AC
  Administered 2021-07-09: 2 g via INTRAVENOUS
  Filled 2021-07-09: qty 50

## 2021-07-09 NOTE — Progress Notes (Signed)
Subjective:  Feels much better, dyspnea has improved.  No further PND or orthopnea  Intake/Output from previous day:  I/O last 3 completed shifts: In: 1237 [P.O.:240; I.V.:947; IV Piggyback:50] Out: 2720 [Urine:2720] Total I/O In: 360 [P.O.:360] Out: 350 [Urine:350] Net IO Since Admission: -2,480.27 mL [07/09/21 2124]  Blood pressure (!) 132/53, pulse 98, temperature 99.7 F (37.6 C), temperature source Oral, resp. rate 20, height 5' (1.524 m), weight 69.9 kg, SpO2 100 %. Physical Exam Vitals reviewed.  Constitutional:      Appearance: She is well-developed.  Neck:     Vascular: No JVD.  Cardiovascular:     Rate and Rhythm: Normal rate and regular rhythm.     Pulses: Intact distal pulses.          Carotid pulses are  on the right side with bruit and  on the left side with bruit.      Radial pulses are 2+ on the right side and 2+ on the left side.       Dorsalis pedis pulses are 1+ on the right side and 1+ on the left side.       Posterior tibial pulses are 1+ on the right side and 2+ on the left side.     Heart sounds: Murmur heard.     Early systolic murmur is present with a grade of 2/6 at the upper right sternal border.     No gallop.  Pulmonary:     Effort: Pulmonary effort is normal. No accessory muscle usage or respiratory distress.     Breath sounds: Examination of the right-middle field reveals decreased breath sounds. Examination of the left-middle field reveals decreased breath sounds. Examination of the right-lower field reveals decreased breath sounds and rales. Examination of the left-lower field reveals decreased breath sounds and rales. Decreased breath sounds and rales present. No wheezing or rhonchi.  Abdominal:     General: Abdomen is flat.     Palpations: Abdomen is soft.  Musculoskeletal:     Right shoulder: Normal.     Right lower leg: Edema (trace ankle edema) present.     Left lower leg: Edema (trace ankle edema) present.     Lab Results: BMP BNP  (last 3 results) Recent Labs    07/06/21 2330 07/08/21 0628 07/09/21 0705  BNP 501.1* 275.2* 205.2*     ProBNP (last 3 results) No results for input(s): "PROBNP" in the last 8760 hours.    Latest Ref Rng & Units 07/09/2021    7:05 AM 07/08/2021   11:06 AM 07/08/2021    6:28 AM  BMP  Glucose 70 - 99 mg/dL 179  225  96   BUN 8 - 23 mg/dL $Remove'23  21  20   'mLmAIEo$ Creatinine 0.44 - 1.00 mg/dL 1.43  1.35  1.27   Sodium 135 - 145 mmol/L 136  138  138   Potassium 3.5 - 5.1 mmol/L 4.4  3.5  3.3   Chloride 98 - 111 mmol/L 96  95  97   CO2 22 - 32 mmol/L 29  31  32   Calcium 8.9 - 10.3 mg/dL 8.5  8.5  8.4       Latest Ref Rng & Units 07/08/2021    3:47 PM 07/06/2021   11:30 PM 08/23/2018    4:34 AM  Hepatic Function  Total Protein 6.5 - 8.1 g/dL 6.6  6.9  6.0   Albumin 3.5 - 5.0 g/dL 2.7  3.1  2.9   AST 15 - 41  U/L 34  44  15   ALT 0 - 44 U/L $Remo'22  24  12   'JSLod$ Alk Phosphatase 38 - 126 U/L 115  134  100   Total Bilirubin 0.3 - 1.2 mg/dL 1.4  1.4  0.4   Bilirubin, Direct 0.0 - 0.2 mg/dL 0.3         Latest Ref Rng & Units 07/09/2021    7:05 AM 07/08/2021    6:28 AM 07/06/2021   11:30 PM  CBC  WBC 4.0 - 10.5 K/uL 15.2  9.8  19.3   Hemoglobin 12.0 - 15.0 g/dL 10.2  10.1  11.3   Hematocrit 36.0 - 46.0 % 32.8  31.6  36.6   Platelets 150 - 400 K/uL 279  253  298    Lipid Panel     Component Value Date/Time   CHOL 81 07/09/2021 0705   TRIG 76 07/09/2021 0705   HDL 31 (L) 07/09/2021 0705   CHOLHDL 2.6 07/09/2021 0705   VLDL 15 07/09/2021 0705   LDLCALC 35 07/09/2021 0705   Cardiac Panel (last 3 results) No results for input(s): "CKTOTAL", "CKMB", "TROPONINI", "RELINDX" in the last 72 hours.  HEMOGLOBIN A1C Lab Results  Component Value Date   HGBA1C 7.8 (H) 07/07/2021   MPG 177.16 07/07/2021   TSH Recent Labs    07/08/21 1547  TSH 1.097    Scheduled Meds:  cephALEXin  250 mg Oral Q12H   clopidogrel  75 mg Oral Daily   DULoxetine  60 mg Oral Daily   [START ON 07/11/2021] empagliflozin   10 mg Oral Daily   ezetimibe  10 mg Oral Daily   ferrous sulfate  325 mg Oral Daily   fesoterodine  8 mg Oral QPM   furosemide  40 mg Oral Daily   gabapentin  300 mg Oral BID   hydrOXYzine  25 mg Oral Daily   hydrOXYzine  50 mg Oral QHS   insulin aspart  0-5 Units Subcutaneous QHS   insulin aspart  0-9 Units Subcutaneous TID WC   [START ON 07/10/2021] insulin glargine-yfgn  13 Units Subcutaneous Daily   isosorbide dinitrate  30 mg Oral BID   linagliptin  5 mg Oral Daily   metoprolol succinate  12.5 mg Oral BID   pantoprazole  40 mg Oral Daily   rosuvastatin  10 mg Oral QAC supper   sodium chloride flush  3 mL Intravenous Q12H   Vibegron  75 mg Oral Daily   Continuous Infusions:  sodium chloride     amiodarone 30 mg/hr (07/09/21 1625)   heparin 1,100 Units/hr (07/09/21 1625)   PRN Meds:.sodium chloride, acetaminophen, acetaminophen, fluticasone, loratadine, prochlorperazine, sodium chloride flush  Radiology:    [facility] Chest Port 1 View 07/07/2021 CLINICAL DATA:  Shortness of breath. EXAM: PORTABLE CHEST 1 VIEW COMPARISON:  10/07/2019 FINDINGS: Patient is post median sternotomy. Planted loop recorder in the left chest wall. Mild cardiomegaly with unchanged mediastinal contours. Moderate pulmonary edema. Small pleural effusions and fluid in the fissure. No pneumothorax. IMPRESSION: CHF with moderate pulmonary edema and small pleural effusions. Electronically Signed   By: Keith Rake M.D.   On: 07/07/2021 00:10     Cardiac Studies:    Coronary angiogram 03/05/2021: LM: Trifurcates.  Ostium 80 to 90% stenosis. LAD: Heavy calcification is noted with ostial 90% stenosis.  Complete filling from the LIMA is noted. LCx: Large vessel, no significant disease. RI: Large vessel, bifurcating vessel, ostial 80 to 90% stenosis.  Competitive filling from the vein  graft is seen. RCA: Large-caliber vessel and a dominant vessel.  70% stenosis in the ostium and mid RCA has a sequential 80 to 90%  stenosis. LIMA to LAD patent. SVG to RI widely patent. SVG to RCA severely diffusely diseased and nonfunctional graft.   Successful IVUS guided PCI of the proximal to distal right coronary artery using 3.5 x 38 and a 3.5 x 38 mm Synergy DES.   Echocardiogram 07/07/2021:   1. Left ventricular ejection fraction, by estimation, is 35 to 40%. The left ventricle has moderately decreased function. The left ventricle demonstrates regional wall motion abnormalities (see scoring diagram/findings for description). There is mild  left ventricular hypertrophy. Left ventricular diastolic parameters are indeterminate. Elevated left ventricular end-diastolic pressure. There is severe of the left ventricular, entire anterior wall. There is mild of the left ventricular, mid-apical  anterolateral wall.  2. Right ventricular systolic function is normal. The right ventricular size is normal. There is normal pulmonary artery systolic pressure. The estimated right ventricular systolic pressure is 33.8 mmHg.  3. Left atrial size was mildly dilated.  4. The mitral valve is normal in structure. Mild mitral valve regurgitation.  5. The aortic valve is tricuspid. Aortic valve regurgitation is mild to moderate. Aortic valve sclerosis/calcification is present, without any evidence of aortic stenosis.  6. The inferior vena cava is normal in size with <50% respiratory variability, suggesting right atrial pressure of 8 mmHg.   Comparison(s): Changes from prior study are noted. Compared to 08/23/2018, LVEF 60-65% without regional wall motion abnormality. Mild MR and TR new.  Compared to 11/27/2019, LVEF is 50 to 55%.  There was no wall motion abnormality.  Otherwise no significant change.   EKG:   EKG 07/06/2021: Probably normal sinus rhythm at rate of 99 bpm, left atrial abnormality.  Right bundle branch block.  ST depression V4 to V6, cannot exclude lateral ischemia.  Compared to the EKG done on 03/11/2021, no significant  change, inferior ST segment depression not present.   Assessment    Linda Oliver is a 88 y.o. 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  Admitted with unstable angina and underwent coronary angiography on 03/05/2021 and overlapping stent plantation to the right coronary artery native as the SVG to RCA was functionally occluded.  Left grafts are widely patent.    1.  NSTEMI 2.  Coronary artery disease of the native vessel with unstable angina pectoris 3.  Acute pulmonary edema 4.  Paroxysmal atrial fibrillation now has been persistent atrial fibrillation since hospital admission             CHA2DS2-VASc Score is 8.  Yearly risk of stroke: > 10% (F, A, HTN, DM, Vasc Dz, CHF).  Score of 1=0.6; 2=2.2; 3=3.2; 4=4.8; 5=7.2; 6=9.8; 7=>9.8) -(CHF; HTN; vasc disease DM,  Female = 1; Age <65 =0; 65-74 = 1,  >75 =2; stroke/embolism= 2). 5. Acute systolic heart failure   Recommendations:    Appears to be much more stable today after cardioversion yesterday.  She is maintaining sinus rhythm on telemetry.  She still has bilateral basilar crackles on exam, I cannot exclude pneumonia.  Continue IV furosemide, start Jardiance tomorrow as she would have  completed 4 to 5 days of antibiotic therapy for possible UTI and/or pneumonia.  She did have a temperature yesterday but remains afebrile today.  Acute pulm edema has resolved but still has significant amount of congestion.  Underlying pneumonia cannot be excluded.  With regard to coronary artery disease and NSTEMI, I will resume Eliquis and discontinue IV heparin as I do not plan on doing cardiac catheterization in view of acute renal failure  on chronic renal insufficiency stage IIIb today.  We will also discontinue amiodarone IV and switch her to p.o. amiodarone.  EKG reveals sinus rhythm without significant prolonged QT interval.  Plan on stabilization medically, ARB has been on hold expecting acute renal insufficiency in view of diuresis.  Once serum creatinine stabilizes will resume losartan/Entresto.  For now continue low-dose beta-blocker, amiodarone, p.o. Lasix and start Eliquis.  We will stable from cardiac standpoint she can be discharged home.   Adrian Prows, MD, [facility] 07/09/2021, 9:24 PM Office: [phone] Fax: [phone] Pager: [phone]

## 2021-07-09 NOTE — Progress Notes (Signed)
Inpatient Diabetes Program Recommendations  AACE/ADA: New Consensus Statement on Inpatient Glycemic Control (2015)  Target Ranges:  Prepandial:   less than 140 mg/dL      Peak postprandial:   less than 180 mg/dL (1-2 hours)      Critically ill patients:  140 - 180 mg/dL   Lab Results  Component Value Date   GLUCAP 252 (H) 07/09/2021   HGBA1C 7.8 (H) 07/07/2021    Review of Glycemic Control  Latest Reference Range & Units 07/08/21 10:54 07/08/21 15:28 07/08/21 16:55 07/08/21 21:03 07/09/21 06:07 07/09/21 11:19  Glucose-Capillary 70 - 99 mg/dL 208 (H) 265 (H) 250 (H) 294 (H) 193 (H) 252 (H)   Diabetes history: DM 2 Outpatient Diabetes medications:   Current orders for Inpatient glycemic control:  Novolog sensitive tid with meals and HS Jardiance 10 mg daily Semglee 10 units daily Tradjenta 5 mg daily Inpatient Diabetes Program Recommendations:    Consider slight increase of Semglee to 14 units daily.   Thanks,  Adah Perl, RN, BC-ADM Inpatient Diabetes Coordinator Pager [phone]  (8a-5p)

## 2021-07-09 NOTE — Anesthesia Postprocedure Evaluation (Signed)
Anesthesia Post Note  Patient: Linda Oliver  Procedure(s) Performed: CARDIOVERSION     Patient location during evaluation: PACU Anesthesia Type: General Level of consciousness: awake and alert Pain management: pain level controlled Vital Signs Assessment: post-procedure vital signs reviewed and stable Respiratory status: spontaneous breathing, nonlabored ventilation and patient connected to nasal cannula oxygen Cardiovascular status: blood pressure returned to baseline and stable Postop Assessment: no apparent nausea or vomiting Anesthetic complications: no   No notable events documented.  Last Vitals:  Vitals:   07/09/21 0337 07/09/21 0733  BP: (!) 153/42 (!) 134/49  Pulse: 84 83  Resp: (!) 24 16  Temp: 36.8 C 36.9 C  SpO2: 100% 100%    Last Pain:  Vitals:   07/09/21 0733  TempSrc: Oral  PainSc:                  Val Farnam

## 2021-07-09 NOTE — Progress Notes (Signed)
Mobility Specialist Progress Note:   07/09/21 1000  Mobility  Activity Ambulated with assistance in hallway  Level of Assistance Minimal assist, patient does 75% or more  Assistive Device Front wheel walker  Distance Ambulated (ft) 60 ft  Activity Response Tolerated well  $Mobility charge 1 Mobility   Pt received in bed willing to participate in mobility. MinA to stand. Left in bed with call bell in reach and all needs met.   [facility] Kaile Bixler Mobility Specialist

## 2021-07-09 NOTE — Plan of Care (Signed)
  Problem: Activity: Goal: Ability to return to baseline activity level will improve Outcome: Progressing   Problem: Cardiovascular: Goal: Ability to achieve and maintain adequate cardiovascular perfusion will improve Outcome: Progressing   Problem: Education: Goal: Ability to describe self-care measures that may prevent or decrease complications (Diabetes Survival Skills Education) will improve Outcome: Progressing   Problem: Coping: Goal: Ability to adjust to condition or change in health will improve Outcome: Progressing   Problem: Health Behavior/Discharge Planning: Goal: Ability to manage health-related needs will improve Outcome: Progressing

## 2021-07-09 NOTE — Progress Notes (Signed)
Pt heparin and amiodarone drips stopped per order and po Eliquis and amiodarone administered as ordered. Pt family at bedside report pt having some hallucinations and confusions. Pt A&Ox 4 with intermittent confusion but not trying to get out bed. Pt is able to recognize family by names during visitation. Pt daughter also expressed concern pt not voiding enough. Pt bladder scanned to have 238 ml. Will continue to monitor pt. Delia Heady RN

## 2021-07-09 NOTE — Assessment & Plan Note (Signed)
Patient has been on oral cephalexin.

## 2021-07-09 NOTE — Care Management Important Message (Signed)
Important Message  Patient Details  Name: Linda Oliver MRN: [id number] Date of Birth: [birth year]   Medicare Important Message Given:  Yes     Shelda Altes 07/09/2021, 8:35 AM

## 2021-07-09 NOTE — Progress Notes (Signed)
ANTICOAGULATION CONSULT NOTE - Follow Up Consult  Pharmacy Consult for Heparin Indication: chest pain/ACS and atrial fibrillation  Allergies  Allergen Reactions   Codeine     vomiting   Metformin     Other reaction(s): nausea, vomitting and diarrhea   Oxycodone-Acetaminophen     Other reaction(s): Unknown   Percocet [Oxycodone-Acetaminophen] Nausea And Vomiting   Tape     Other reaction(s): Unknown   Tolterodine Other (See Comments)   Latex Itching and Rash    EKG leads    Patient Measurements: Height: 5' (152.4 cm) Weight: 69.9 kg (154 lb 1.6 oz) IBW/kg (Calculated) : 45.5 Heparin Dosing Weight: 65 kg  Vital Signs: Temp: 98.4 F (36.9 C) (06/30 0733) Temp Source: Oral (06/30 0733) BP: 134/49 (06/30 0733) Pulse Rate: 83 (06/30 0733)  Labs: Recent Labs    07/06/21 2330 07/07/21 0152 07/07/21 0525 07/07/21 1500 07/07/21 1500 07/07/21 2131 07/08/21 0628 07/08/21 1106 07/08/21 1822 07/09/21 0705  HGB 11.3*  --   --   --   --   --  10.1*  --   --  10.2*  HCT 36.6  --   --   --   --   --  31.6*  --   --  32.8*  PLT 298  --   --   --   --   --  253  --   --  279  APTT  --   --   --  75*   < > 70* 74*  --   --  65*  LABPROT  --   --   --   --   --   --   --   --  17.7*  --   INR  --   --   --   --   --   --   --   --  1.5*  --   HEPARINUNFRC  --   --   --  >1.10*  --   --  >1.10*  --   --  >1.10*  CREATININE 1.16*  --   --   --   --   --  1.27* 1.35*  --  1.43*  TROPONINIHS 58* 196* 341*  --   --   --   --   --   --   --    < > = values in this interval not displayed.    Estimated Creatinine Clearance: 24.7 mL/min (A) (by C-G formula based on SCr of 1.43 mg/dL (H)).  Assessment: 88yo female admitted 07/06/21 c/o inc'd WOB w/ worsening SOB since 07/03/21, troponin elevated, on Eliquis PTA for Afib (last dose 6/26 am), transitioned to IV heparin on 6/28 am.    aPTT 65 this am, just below target range on Heparin at 1000 units/hr. Heparin level >1.10 falsely elevated  due to recent Eliquis doses. S/p cardioversion on 07/08/21.  Noted plan for cardiac cath.  Goal of Therapy:  Heparin level 0.3-0.7 units/ml aPTT 66-102 seconds Monitor platelets by anticoagulation protocol: Yes   Plan:  Increase heparin drip to 1100 units/hr. Continue daily aPTT and heparin level until correlating; daily CBC. Eliquis on hold. Will follow up plans.  Arty Baumgartner, RPh 07/09/2021,8:48 AM

## 2021-07-09 NOTE — Progress Notes (Addendum)
Heart Failure Stewardship Pharmacist Progress Note   PCP: Shon Baton, MD PCP-Cardiologist: None    HPI:  88 yo F with PMH of CAD s/p CABG, HTN, afib, HLD, OSA, T2DM, and CKD II.  She presented to the ED on 6/27 with shortness of breath, O2 sat 70% at home, and cough. Also with burning sensations when urination - she was started on Macrobid for 7 days as outpatient on 6/26. Last dose of Keflex on 7/1.  ECHO 6/28 with LVEF 35-40% (dropped from 50-55% in 11/2019), mild LVH, mild MR, RV ok.  S/p successful DCCV on 6/29.  Current HF Medications: Diuretic: furosemide 40 mg PO daily Beta Blocker: metoprolol XL 12.5 mg daily SGLT2i: Jardiance 10 mg daily - to start 7/2 after UTI treatment completed Other: Isordil 30 mg BID  Prior to admission HF Medications: Diuretic: furosemide 20 mg daily Beta blocker: metoprolol XL 12.5 mg BID Other: Imdur 30 mg BID  Pertinent Lab Values: Serum creatinine 1.43, BUN 23, Potassium 4.4, Sodium 136, Magnesium 1.8 BNP 501.1>>205.2 A1c 7.8 Urinalysis clean for infection  Vital Signs: Weight: 154 lbs (admission weight: 165 lbs) Blood pressure: 120-140/50s  Heart rate: 80s - NSR I/O: -2L yesterday; net -2.6L  Medication Assistance / Insurance Benefits Check: Does the patient have prescription insurance?  Yes Type of insurance plan: [place]:  Prior to admission outpatient pharmacy: [facility] Is the patient willing to use [facility] at discharge? Yes Is the patient willing to transition their outpatient pharmacy to utilize a [facility] outpatient pharmacy?   Pending    Assessment: 1. Acute systolic CHF (LVEF 26-33%). NYHA class II symptoms. - Volume improved - agree with starting PO lasix. Mag 1.8 today - give 2g IV x 1. Keep K>4 and Mag>2.  - Continue metoprolol XL 12.5 mg daily - was taking BID PTA - Consider starting Entresto 24/26 mg BID prior to discharge if BP and SCr allow - Consider starting spironolactone 25  mg daily  - Starting Jardiance 10 mg daily on 7/2 after UTI treatment completed - preferred SGLT2i with [employer]. UA clear. - Continue Isordil 30 mg BID   Plan: 1) Medication changes recommended at this time: - Start spironolactone 25 mg daily - Magnesium 2g IV x 1  2) Patient assistance: - None needed - has [employer]  3)  Education  - To be completed prior to discharge  Kerby Nora, PharmD, BCPS Heart Failure Cytogeneticist Phone [phone]

## 2021-07-09 NOTE — TOC Progression Note (Signed)
Transition of Care [facility]) - Progression Note    Patient Details  Name: Linda Oliver MRN: [id number] Date of Birth: 03/28/1932  Transition of [facility]) CM/SW Contact  Zenon Mayo, RN Phone Number: 07/09/2021, 4:46 PM  Clinical Narrative:     from home with daughter , CHF, spiking fever, uti, amio drip, hep drip.  S/p cardioversion.  TOC will continue to follow for dc needs.        Expected Discharge Plan and Services                                                 Social Determinants of Health (SDOH) Interventions    Readmission Risk Interventions     No data to display

## 2021-07-09 NOTE — Progress Notes (Signed)
Progress Note   Patient: Linda Oliver DZH:[id number] DOB: 1932/03/27 DOA: 07/06/2021     2 DOS: the patient was seen and examined on 07/09/2021   Brief hospital course: As per H&P written by Dr. Roosevelt Locks on 07/07/21  [name] is a 88 y.o. female with medical history significant of CAD status post CABG and stenting, HTN, PAF on Eliquis HLD, OSA on CPAP at bedtime, IDDM, cKd stage II, came with cough and SOB.   Started to have shortness of breath and dry cough 4 days ago and gradually getting worse.  Denies any fever chills no chest pains.  Last week patient also started to have burning sensations on urination and PCP started patient on nitrofurantoin and patient just completed 2 days worth.  Family called EMS, EMS arrived and found patient O2 saturation in the 70s and patient was put on BiPAP.   ED Course: O2 saturation 97% on BiPAP 40%, no tachycardia no hypotensive.  Blood pressure elevated mildly.  Chest x-ray showed pulmonary edema.   Assessment and Plan: * Acute on chronic diastolic CHF (congestive heart failure) (HCC) - Excellent response of diuresis and the use of BiPAP at time of admission. -further diuresis provided on 6/29 afternoon -after cardioversion resp distress subsided (questionable symptoms/exacerbation of CHF driven by arrhythmia) -Now back to chronic oxygen supplementation reporting breathing essentially back to baseline and demonstrating good saturation.. -Trace edema appreciated on her legs but not significant crackles and patient tolerating being almost completely flat in bed. -Patient denies chest pain. -Good urine output reported -Continue to follow cardiology service recommendation and plans for GDMT. -renal function limiting meds.  Paroxysmal atrial fibrillation (HCC) -chronically on eliquis and since admission with sustained. A. Fib. -currently receiving heparin drip and S/P cardioversion on 07/09/21 -continue telemetry monitoring -follow cardiology  rec's -follow electrolytes.  Hypertensive heart disease - Blood pressure currently stable -Continue current antihypertensive agents. -follow cardiology service rec's.  Type 2 diabetes mellitus with vascular disease ([place]) -continue SSI, semglee and tradjenta. -follow CBG's.  GERD (gastroesophageal reflux disease) - Continue PPI.  Obesity (BMI 30-39.9) - Low calorie diet and portion control discussed with patient -Body mass index is 31.78 kg/m.   Hyperlipidemia -continue statin  UTI (urinary tract infection) -patient receiving oral abx's for infection prior to admission. -no dysuria or hematuria reported -continue home abx's as previously indicated. -follow clinical response.  NSTEMI (non-ST elevated myocardial infarction) ([place]) -Currently chest pain-free -After discussing with cardiology planning for cardiac cath; timing to be decided. -Continue IV heparin, Imdur, metoprolol and Crestor for now.  Prolonged QT interval -Continue telemetry monitoring -Follow electrolytes, replete as needed.  Goal is for potassium >4 and magnesium as close as possible to 2.. -Avoid medications that can further prolong QT. -continue telemetry.  OSA on CPAP - Continue CPAP nightly.  Hypokalemia: -In the setting of diuresis -Continue to follow trend and replete as needed. -Potassium 4.4 currently. -Magnesium one-point  Subjective:  Patient reports no chest pain or palpitations currently; no fever, no nausea, no vomiting.  On 07/08/2021 afternoon she spiked fever, went into A-fib with RVR and experiencing worsening respiratory distress.  Physical Exam: Vitals:   07/09/21 0006 07/09/21 0337 07/09/21 0433 07/09/21 0733  BP: (!) 129/54 (!) 153/42  (!) 134/49  Pulse: 78 84  83  Resp: 20 (!) 24  16  Temp: 98.3 F (36.8 C) 98.3 F (36.8 C)  98.4 F (36.9 C)  TempSrc: Oral Oral  Oral  SpO2: 100% 100%  100%  Weight:  69.9 kg   Height:       General exam: Alert, awake, oriented x  3; no fever, status post cardioversion expressing feeling better and breathing easier.  No chest pain, no nausea, no vomiting. Respiratory system: Decreased breath sounds at the bases, mild rales heard on examination.  No wheezing, no using accessory muscle.  Good saturation on chronic supplementation. Cardiovascular system: Rate controlled, no rubs, no gallops, no JVD.   Gastrointestinal system: Abdomen is nondistended, soft and nontender. No organomegaly or masses felt. Normal bowel sounds heard. Central nervous system: Alert and oriented. No focal neurological deficits. Extremities: No cyanosis, no clubbing; trace edema appreciated bilaterally. Skin: No petechiae Psychiatry: Judgement and insight appear normal. Mood & affect appropriate.   Data Reviewed: CBC: WBC 15.2, hemoglobin 10.2, platelet count 279 K. Basic metabolic panel: Sodium 574, potassium 4.4, chloride 96, BUN 23, bicarb 29 and renal function 1.43. BNP 205 Lipid panel pending. Mg 1.8  Family Communication: No family at bedside.  Disposition: Status is: Inpatient Remains inpatient appropriate because: Receiving diuresis and optimization of medications; s/p cardioversion; still adjusting diuretics and optimizing medications.   Planned Discharge Destination: Home   Author: Barton Dubois, MD 07/09/2021 11:44 AM  For on call review [email or web address].

## 2021-07-10 DIAGNOSIS — I119 Hypertensive heart disease without heart failure: Secondary | ICD-10-CM | POA: Diagnosis not present

## 2021-07-10 DIAGNOSIS — J181 Lobar pneumonia, unspecified organism: Secondary | ICD-10-CM | POA: Diagnosis present

## 2021-07-10 DIAGNOSIS — K219 Gastro-esophageal reflux disease without esophagitis: Secondary | ICD-10-CM | POA: Diagnosis not present

## 2021-07-10 DIAGNOSIS — N3000 Acute cystitis without hematuria: Secondary | ICD-10-CM

## 2021-07-10 DIAGNOSIS — E1169 Type 2 diabetes mellitus with other specified complication: Secondary | ICD-10-CM | POA: Diagnosis not present

## 2021-07-10 DIAGNOSIS — I5033 Acute on chronic diastolic (congestive) heart failure: Secondary | ICD-10-CM | POA: Diagnosis not present

## 2021-07-10 DIAGNOSIS — E785 Hyperlipidemia, unspecified: Secondary | ICD-10-CM

## 2021-07-10 LAB — BASIC METABOLIC PANEL
Anion gap: 11 (ref 5–15)
BUN: 21 mg/dL (ref 8–23)
CO2: 30 mmol/L (ref 22–32)
Calcium: 8.3 mg/dL — ABNORMAL LOW (ref 8.9–10.3)
Chloride: 93 mmol/L — ABNORMAL LOW (ref 98–111)
Creatinine, Ser: 1.39 mg/dL — ABNORMAL HIGH (ref 0.44–1.00)
GFR, Estimated: 37 mL/min — ABNORMAL LOW (ref >60)
Glucose, Bld: 137 mg/dL — ABNORMAL HIGH (ref 70–99)
Potassium: 3.8 mmol/L (ref 3.5–5.1)
Sodium: 134 mmol/L — ABNORMAL LOW (ref 135–145)

## 2021-07-10 LAB — GLUCOSE, CAPILLARY
Glucose-Capillary: 144 mg/dL — ABNORMAL HIGH (ref 70–99)
Glucose-Capillary: 168 mg/dL — ABNORMAL HIGH (ref 70–99)
Glucose-Capillary: 208 mg/dL — ABNORMAL HIGH (ref 70–99)
Glucose-Capillary: 171 mg/dL — ABNORMAL HIGH (ref 70–99)

## 2021-07-10 MED ORDER — AMOXICILLIN-POT CLAVULANATE 875-125 MG PO TABS
1.0000 | ORAL_TABLET | Freq: Two times a day (BID) | ORAL | Status: DC
Start: 2021-07-10 — End: 2021-07-12
  Administered 2021-07-10 – 2021-07-12 (×4): 1 via ORAL
  Filled 2021-07-10 (×5): qty 1

## 2021-07-10 MED ORDER — FUROSEMIDE 10 MG/ML IJ SOLN
40.0000 mg | Freq: Once | INTRAMUSCULAR | Status: AC
  Administered 2021-07-10: 40 mg via INTRAVENOUS
  Filled 2021-07-10: qty 4

## 2021-07-10 NOTE — Progress Notes (Signed)
Progress Note   Patient: Linda Oliver:[id number] DOB: Dec 15, 1932 DOA: 07/06/2021     3 DOS: the patient was seen and examined on 07/10/2021   Brief hospital course: Linda Oliver was admitted to the hospital with the working diagnosis of decompensated heart failure.   88 yo female with the past medical history of CAD, sp CABG, paroxysmal atrial fibrillation, CKD and T2DM who presented with dyspnea and cough. Reported 4 days of dry cough and dyspnea. Worsening symptoms. On the day of admission his 02 saturation was in the 70's and EMS was called. In the Ed he was in respiratory distress and was placed on Bipap, with improving oxygenation to 98%, blood pressure 137/54, HR 90, RR 27. Lungs with rales and increased work of breathing, no wheezing, heart with S1 and S2 present and rhythmic with no gallops, abdomen not distended and no lower extremity edema.   Chest radiograph with bilateral hilar vascular congestion, bilateral interstitial infiltrates. Cephalization of the vasculature.   EKG 99 bpm, right axis deviation, with right bundle branch block. Qtc 514, sinus rhythm with no significant ST segment or T wave changes.       Assessment and Plan: * Acute on chronic diastolic CHF (congestive heart failure) (HCC) Echocardiogram with reduced LV systolic function with EF 35 to 40%, positive regional wall motion abnormalities. Preserved RV systolic function. No significant valvular disease.   Urine output is 109 ml Systolic blood pressure 323 to 153 mmHg.   Continue with empagliflozin, furosemide, isosorbide and metoprolol.   Paroxysmal atrial fibrillation (HCC) S/P cardioversion on 07/09/21  Continue rate control with metoprolol and rhythm control with amiodarone.  Continue anticoagulation with apixaban.   Hypertensive heart disease Continue blood pressure control with metoprolol and isosorbide.   GERD (gastroesophageal reflux disease) - Continue PPI.  NSTEMI (non-ST elevated  myocardial infarction) [facility]) Patient was placed on heparin for medical therapy. Currently is chest pain free.   Type 2 diabetes mellitus with hyperlipidemia (HCC) -continue SSI, semglee and tradjenta. -follow CBG's.  Continue with statin therapy.   OSA on CPAP - Continue CPAP nightly.  Prolonged QT interval -Continue telemetry monitoring -Follow electrolytes, replete as needed.  Goal is for potassium >4 and magnesium as close as possible to 2.. -Avoid medications that can further prolong QT. -continue telemetry.  UTI (urinary tract infection) Patient has been on oral cephalexin.   Obesity (BMI 30-39.9) - Low calorie diet and portion control discussed with patient -Body mass index is 31.78 kg/m.         Subjective: Patient very weak and deconditioned, she has been using intermittent Bipap during the day,.   Physical Exam: Vitals:   07/10/21 0432 07/10/21 0719 07/10/21 0900 07/10/21 1150  BP: 136/69 (!) 144/47  (!) 137/56  Pulse: 93 94 93 97  Resp: (!) 23 (!) 23 (!) 24 17  Temp: 98.4 F (36.9 C) 99.2 F (37.3 C)  98.3 F (36.8 C)  TempSrc: Oral Oral  Oral  SpO2: 96% 95% 96% 97%  Weight: 70.8 kg     Height:       Neurology awake and alert ENT with no pallor Cardiovascular with S1 and S2 present irregularly irregular with no gallops, rubs or murmurs  Respiratory with no wheezing, rales or rhonchi Abdomen not distended Trace lower extremity edema  Data Reviewed:    Family Communication: I spoke with patient's husband  at the bedside, we talked in detail about patient's condition, plan of care and prognosis and all questions were  addressed.    Disposition: Status is: Inpatient Remains inpatient appropriate because: heart failure   Planned Discharge Destination: Home     Author: Tawni Millers, MD 07/10/2021 4:45 PM  For on call review [email or web address].

## 2021-07-10 NOTE — Hospital Course (Signed)
Linda Oliver was admitted to the hospital with the working diagnosis of decompensated heart failure.   88 yo female with the past medical history of CAD, sp CABG, paroxysmal atrial fibrillation, CKD and T2DM who presented with dyspnea and cough. Reported 4 days of dry cough and dyspnea. Worsening symptoms. On the day of admission his 02 saturation was in the 70's and EMS was called. In the Ed he was in respiratory distress and was placed on Bipap, with improving oxygenation to 98%, blood pressure 137/54, HR 90, RR 27. Lungs with rales and increased work of breathing, no wheezing, heart with S1 and S2 present and rhythmic with no gallops, abdomen not distended and no lower extremity edema.   Chest radiograph with bilateral hilar vascular congestion, bilateral interstitial infiltrates. Cephalization of the vasculature.   EKG 99 bpm, right axis deviation, with right bundle branch block. Qtc 514, sinus rhythm with no significant ST segment or T wave changes.   Patient has been placed on furosemide for diuresis with improvement of her symptoms.  She required intermittent Bipap for increase work of breathing.   Chest film with left upper lobe infiltrate, consistent with community acquired pneumonia.   07/03 cardiac catheterization, part of ischemia work up.

## 2021-07-10 NOTE — Progress Notes (Signed)
Subjective:  Feels okay  Still on supplemental oxygen  Objective:  Vital Signs in the last 24 hours: Temp:  [98.3 F (36.8 C)-100.1 F (37.8 C)] 98.3 F (36.8 C) (07/01 1150) Pulse Rate:  [79-103] 97 (07/01 1150) Resp:  [17-24] 17 (07/01 1150) BP: (129-144)/(47-69) 137/56 (07/01 1150) SpO2:  [95 %-100 %] 97 % (07/01 1150) Weight:  [70.8 kg] 70.8 kg (07/01 0432)  Intake/Output from previous day: 06/30 0701 - 07/01 0700 In: 922.1 [P.O.:360; I.V.:512.1; IV Piggyback:50] Out: 650 [Urine:650]  Physical Exam Vitals and nursing note reviewed.  Constitutional:      General: She is not in acute distress. Neck:     Vascular: No JVD.  Cardiovascular:     Rate and Rhythm: Normal rate. Rhythm irregular.     Heart sounds: Normal heart sounds. No murmur heard. Pulmonary:     Effort: Pulmonary effort is normal. No respiratory distress.     Breath sounds: Decreased breath sounds present. No wheezing or rales.  Musculoskeletal:     Right lower leg: Edema (Trace) present.     Left lower leg: Edema (Trace) present.      Imaging/tests reviewed and independently interpreted:  CXR 07/09/2021: Worsening anterior left upper lobe airspace opacities suggesting pneumonia.    Cardiac Studies:  Telemetry 07/10/2021: Afib   EKG 07/10/2021: Afib 89 bpm RBBB  Echocardiogram 07/07/2021:  1. Left ventricular ejection fraction, by estimation, is 35 to 40%. The  left ventricle has moderately decreased function. The left ventricle  demonstrates regional wall motion abnormalities (see scoring  diagram/findings for description). There is mild  left ventricular hypertrophy. Left ventricular diastolic parameters are  indeterminate. Elevated left ventricular end-diastolic pressure. There is  severe of the left ventricular, entire anterior wall. There is mild of the  left ventricular, mid-apical  anterolateral wall.   2. Right ventricular systolic function is normal. The right ventricular  size is  normal. There is normal pulmonary artery systolic pressure. The  estimated right ventricular systolic pressure is 98.9 mmHg.   3. Left atrial size was mildly dilated.   4. The mitral valve is normal in structure. Mild mitral valve  regurgitation.   5. The aortic valve is tricuspid. Aortic valve regurgitation is mild to  moderate. Aortic valve sclerosis/calcification is present, without any  evidence of aortic stenosis.   6. The inferior vena cava is normal in size with <50% respiratory  variability, suggesting right atrial pressure of 8 mmHg.   Comparison(s): Changes from prior study are noted. Compared to 08/23/2018,  LVEF 60-65% without regional wall motion abnormality. Mild MR and TR new.     Assessment & Recommendations:  88 y/o Caucasian female with hypertension, hyperlipidemia, type 2 DM, CAD s/p CABGX3, PAD,  h/o stroke, PAF, CKD 3, OSA on CPAP, admitted with hypoxic respiratory failure  Acute hypoxic respiratory failure: Multifactorial with acute on chronic HFrEF, pneumonia, Afib -2.6 L net negative. Continues to have opacities concerning for pneumonia on chest Xray.  Also has temp Tmax 100.1 F Also, back in Afib-fortunately rate controlled, since cardioversion Continue diuresis, okay to continue PO lasix 40 mg daily. Will use one dose of IV lasix 40 mg today. Continue metoprolol succinate-on 12.5 mg bid, Bidil 30 mg bid, Jardiance 10 mg daily. If and when renal function improves, could introduce Entresto or spironolactone.  PAF:  Back in Afib after cardioversion 07/08/2021. Given that she is back in Afib regardless, I will discontinue. Unless rate control becomes an issue, I will probably leave amiodarone off.  High CHA2DS2VASc score. Continue eliquis 5 mg bid  CAD: NSTEMI vs type 2 MI in the setting of heart failure Continue plavix 75 mg daily.   Discussed interpretation of tests and management recommendations with the primary team   Nigel Mormon, MD Pager:  [phone] Office: [phone]

## 2021-07-10 NOTE — Progress Notes (Signed)
Patient refused CPAP QHS tonight. Vital signs stable at this time.

## 2021-07-10 NOTE — Assessment & Plan Note (Signed)
Echocardiogram with reduced LV systolic function with EF 35 to 40%, positive regional wall motion abnormalities. Preserved RV systolic function. No significant valvular disease.   Urine output is 518 ml Systolic blood pressure 343 to 153 mmHg.   Continue with empagliflozin, furosemide, isosorbide and metoprolol.

## 2021-07-10 NOTE — Assessment & Plan Note (Signed)
Follow up chest radiograph with left upper lobe infiltrate.  Chest film personally reviewed, noted improvement in pulmonary edema but persistent infiltrate on the left upper lobe.   Will change cephalexin for Augmentin for now  Continue to monitor oxygenation.

## 2021-07-10 NOTE — Progress Notes (Signed)
Increased drowsiness, placed on CPAP machine

## 2021-07-11 DIAGNOSIS — I10 Essential (primary) hypertension: Secondary | ICD-10-CM | POA: Diagnosis not present

## 2021-07-11 DIAGNOSIS — I5033 Acute on chronic diastolic (congestive) heart failure: Secondary | ICD-10-CM | POA: Diagnosis not present

## 2021-07-11 DIAGNOSIS — N179 Acute kidney failure, unspecified: Secondary | ICD-10-CM

## 2021-07-11 DIAGNOSIS — N189 Chronic kidney disease, unspecified: Secondary | ICD-10-CM

## 2021-07-11 DIAGNOSIS — I480 Paroxysmal atrial fibrillation: Secondary | ICD-10-CM | POA: Diagnosis not present

## 2021-07-11 DIAGNOSIS — K219 Gastro-esophageal reflux disease without esophagitis: Secondary | ICD-10-CM | POA: Diagnosis not present

## 2021-07-11 LAB — GLUCOSE, CAPILLARY
Glucose-Capillary: 164 mg/dL — ABNORMAL HIGH (ref 70–99)
Glucose-Capillary: 231 mg/dL — ABNORMAL HIGH (ref 70–99)
Glucose-Capillary: 187 mg/dL — ABNORMAL HIGH (ref 70–99)
Glucose-Capillary: 256 mg/dL — ABNORMAL HIGH (ref 70–99)

## 2021-07-11 LAB — BASIC METABOLIC PANEL
Anion gap: 15 (ref 5–15)
BUN: 22 mg/dL (ref 8–23)
CO2: 32 mmol/L (ref 22–32)
Calcium: 8.7 mg/dL — ABNORMAL LOW (ref 8.9–10.3)
Chloride: 90 mmol/L — ABNORMAL LOW (ref 98–111)
Creatinine, Ser: 1.35 mg/dL — ABNORMAL HIGH (ref 0.44–1.00)
GFR, Estimated: 38 mL/min — ABNORMAL LOW (ref >60)
Glucose, Bld: 165 mg/dL — ABNORMAL HIGH (ref 70–99)
Potassium: 3.3 mmol/L — ABNORMAL LOW (ref 3.5–5.1)
Sodium: 137 mmol/L (ref 135–145)

## 2021-07-11 MED ORDER — POTASSIUM CHLORIDE CRYS ER 20 MEQ PO TBCR
40.0000 meq | EXTENDED_RELEASE_TABLET | Freq: Once | ORAL | Status: AC
  Administered 2021-07-11: 40 meq via ORAL
  Filled 2021-07-11: qty 2

## 2021-07-11 MED ORDER — HEPARIN (PORCINE) 25000 UT/250ML-% IV SOLN
1000.0000 [IU]/h | INTRAVENOUS | Status: DC
  Administered 2021-07-11: 1000 [IU]/h via INTRAVENOUS
  Filled 2021-07-11: qty 250

## 2021-07-11 NOTE — Progress Notes (Signed)
ANTICOAGULATION CONSULT NOTE - Initial Consult  Pharmacy Consult for IV heparin Indication: atrial fibrillation, recent cardioversion  Allergies  Allergen Reactions   Codeine     vomiting   Metformin     Other reaction(s): nausea, vomitting and diarrhea   Oxycodone-Acetaminophen     Other reaction(s): Unknown   Percocet [Oxycodone-Acetaminophen] Nausea And Vomiting   Tape     Other reaction(s): Unknown   Tolterodine Other (See Comments)   Latex Itching and Rash    EKG leads    Patient Measurements: Height: 5' (152.4 cm) Weight: 68.5 kg (151 lb 0.2 oz) IBW/kg (Calculated) : 45.5 Heparin Dosing Weight: 68 kg  Vital Signs: Temp: 97.8 F (36.6 C) (07/02 1157) Temp Source: Oral (07/02 1157) BP: 124/45 (07/02 1157) Pulse Rate: 90 (07/02 1157)  Labs: Recent Labs    07/08/21 1822 07/09/21 0705 07/10/21 0351 07/11/21 0412  HGB  --  10.2*  --   --   HCT  --  32.8*  --   --   PLT  --  279  --   --   APTT  --  65*  --   --   LABPROT 17.7*  --   --   --   INR 1.5*  --   --   --   HEPARINUNFRC  --  >1.10*  --   --   CREATININE  --  1.43* 1.39* 1.35*    Estimated Creatinine Clearance: 25.8 mL/min (A) (by C-G formula based on SCr of 1.35 mg/dL (H)).   Medical History: Past Medical History:  Diagnosis Date   Anginal pain ([place])    occ   Anxiety    Broken ankle    Bullous pemphigus    CAD (coronary artery disease)    [place]   80% stenosis ostial Left main, 30% stenosis proximal LAD, 80% stenosis proximal RCA CABG  -  LIMA to LAD, SVG to int, SVG to [place], [place]    Depression    Diabetic peripheral neuropathy ([place])    Encounter for loop recorder check 04/11/2019   GERD (gastroesophageal reflux disease)    H/O: stroke: Left occipital stroke and vision loss 08/24/2018   Headache(784.0)    hx migraines   Heart murmur    mvp   Hyperlipidemia    Hypertension    Hypertensive heart disease    Loop recorder Reveal 08/24/2018 04/11/2019    Scheduled Remote loop recorder check 03/10/2019: The presenting rhythm is sinus bradycardia. There were 0 symptomatic patient activations recorded. There were 275 AF episodes and 0 AT episodes recorded, representing a 4.2 % cumulative AT/AF burden. The longest episode lasted 00:03:40:00 in duration. Some false with PAC. There were 0 tachy episodes detected. There were 0 pause episodes detected. Th   Nasal bleeding    occ due to O2   OA (osteoarthritis)    Obesity (BMI 30-39.9)    Obstructive sleep apnea on CPAP    Osteopenia    Paroxysmal atrial fibrillation ([place]) 04/17/2019   Peripheral neuropathy    Seizure ([place])    2009   Sleep apnea    dx 60yr ago doesnt use cpap but uses O2 at nite 2L   Stones in the urinary tract    Type 2 diabetes mellitus with vascular disease ([place]    UTI (lower urinary tract infection)     Medications:  Infusions:   sodium chloride     heparin      Assessment: 88  yo female on chronic Eliquis for afib and recent cardioversion, with plans for cath tomorrow AM.  Pharmacy asked to hold Eliquis and start IV heparin.  Last dose of Eliquis this AM ~ 9 AM.  Goal of Therapy:  Heparin level 0.3-0.7 units/ml Monitor platelets by anticoagulation protocol: Yes   Plan:  Hold Eliquis. Start IV heparin at 1000 units/hr with no bolus tonight at 9 pm. Check heparin level 8 hrs after gtt starts. Daily heparin level and CBC.  [place] Crane, Roylene Reason, BCCP Clinical Pharmacist  07/11/2021 3:36 PM   [facility] pharmacy phone numbers are listed on [email or web address]

## 2021-07-11 NOTE — Assessment & Plan Note (Addendum)
CKD stage 3a. Hypokalemia.   Patient tolerated well diuresis with furosemide and K was corrected with Kcl.  Her discharge renal function remained stable.  Follow up renal function and electrolytes as outpatient.

## 2021-07-11 NOTE — Progress Notes (Addendum)
Progress Note   Patient: Linda Oliver VQM:[id number] DOB: 11/05/1932 DOA: 07/06/2021     4 DOS: the patient was seen and examined on 07/11/2021   Brief hospital course: Linda Oliver was admitted to the hospital with the working diagnosis of decompensated heart failure.   88 yo female with the past medical history of CAD, sp CABG, paroxysmal atrial fibrillation, CKD and T2DM who presented with dyspnea and cough. Reported 4 days of dry cough and dyspnea. Worsening symptoms. On the day of admission his 02 saturation was in the 70's and EMS was called. In the Ed he was in respiratory distress and was placed on Bipap, with improving oxygenation to 98%, blood pressure 137/54, HR 90, RR 27. Lungs with rales and increased work of breathing, no wheezing, heart with S1 and S2 present and rhythmic with no gallops, abdomen not distended and no lower extremity edema.   Chest radiograph with bilateral hilar vascular congestion, bilateral interstitial infiltrates. Cephalization of the vasculature.   EKG 99 bpm, right axis deviation, with right bundle branch block. Qtc 514, sinus rhythm with no significant ST segment or T wave changes.   Patient has been placed on furosemide for diuresis with improvement of her symptoms.  She required intermittent Bipap for increase work of breathing.   Chest film with left upper lobe infiltrate, consistent with community acquired pneumonia.    Assessment and Plan: * Acute on chronic diastolic CHF (congestive heart failure) (HCC) Echocardiogram with reduced LV systolic function with EF 35 to 40%, positive regional wall motion abnormalities. Preserved RV systolic function. No significant valvular disease.   Urine output is 932  ml Systolic blood pressure 671 to 124 mmHg.   Volume status has improved, plan to continue with empagliflozin, isosorbide and metoprolol.  Continue with furosemide 40 mg daily.   Paroxysmal atrial fibrillation (HCC) S/P cardioversion on  07/09/21  Continue rate control with metoprolol and rhythm control with amiodarone.  Continue anticoagulation with apixaban.   Prolonged Qtc to 511 in the setting of right bundle branch block.   Essential hypertension Continue blood pressure control with metoprolol and isosorbide.   GERD (gastroesophageal reflux disease) - Continue PPI.  Lobar pneumonia ([place]) Follow up chest radiograph with left upper lobe infiltrate.  Chest film personally reviewed, noted improvement in pulmonary edema but persistent infiltrate on the left upper lobe.   Plan to continue with Augmentin for a total of 5 days.   NSTEMI (non-ST elevated myocardial infarction) [facility]) Patient was placed on heparin for medical therapy. Currently is chest pain free.   Type 2 diabetes mellitus with hyperlipidemia (HCC) Glucose has been stable, continue insulin therapy with basal 13 units and insulin sliding scale for glucose cover and monitoring.  Continue with linagliptin.   Continue with rosuvastatin.   OSA on CPAP Patient declined Cpap at night.  Use as needed as tolerated.   UTI (urinary tract infection) Completed antibiotic therapy.   Obesity (BMI 30-39.9) - Low calorie diet and portion control discussed with patient -Body mass index is 31.78 kg/m.   Acute kidney injury superimposed on chronic kidney disease (HCC) CKD stage 3a. Hypokalemia.   Renal function with serum cr at 1.35 with K at 3,3 and serum bicarbonate at 32.  Plan to continue diuresis with oral furosemide, add 40 meq Kcl and follow up renal function in am.  Avoid hypotension and nephrotoxic medications.       Subjective: Patient is more awake and alert today, her dyspnea has improved, continue to be  very weak and deconditioned   Physical Exam: Vitals:   07/11/21 0456 07/11/21 0900 07/11/21 0904 07/11/21 1157  BP: (!) 137/59  (!) 156/47 (!) 124/45  Pulse: 83 75 84 90  Resp: 19 (!) 23 19 (!) 24  Temp: 98.2 F (36.8 C)  98.2 F (36.8  C) 97.8 F (36.6 C)  TempSrc: Axillary  Oral Oral  SpO2: 98% 99% 95% 96%  Weight: 68.5 kg     Height:       Neurology awake and alert  ENT with no pallor Cardiovascular with S1 and S2 present irregularly irregular with no gallops, rubs or murmurs No JVD No lower extremity edema  Respiratory with no rales or wheezing Abdomen not distended  Data Reviewed:    Family Communication: I spoke over the phone with the patient's daughter about patient's  condition, plan of care, prognosis and all questions were addressed.   Disposition: Status is: Inpatient Remains inpatient appropriate because: heart failure   Planned Discharge Destination: Home      Author: Tawni Millers, MD 07/11/2021 12:46 PM  For on call review [email or web address].

## 2021-07-11 NOTE — Progress Notes (Signed)
Subjective:  Feels okay No chest pain Breathing improved, but still on supplemental oxygen  Objective:  Vital Signs in the last 24 hours: Temp:  [97.8 F (36.6 C)-100 F (37.8 C)] 97.8 F (36.6 C) (07/02 1157) Pulse Rate:  [75-95] 90 (07/02 1157) Resp:  [16-28] 24 (07/02 1157) BP: (118-156)/(45-65) 124/45 (07/02 1157) SpO2:  [95 %-100 %] 96 % (07/02 1157) Weight:  [68.5 kg] 68.5 kg (07/02 0456)  Intake/Output from previous day: 07/01 0701 - 07/02 0700 In: 120 [P.O.:120] Out: 925 [Urine:925]  Physical Exam Vitals and nursing note reviewed.  Constitutional:      General: She is not in acute distress. Neck:     Vascular: No JVD.  Cardiovascular:     Rate and Rhythm: Normal rate. Rhythm irregular.     Heart sounds: Normal heart sounds. No murmur heard. Pulmonary:     Effort: Pulmonary effort is normal. No respiratory distress.     Breath sounds: Decreased breath sounds and rales (Minimal left basal) present. No wheezing.  Musculoskeletal:     Right lower leg: No edema.     Left lower leg: No edema.      Imaging/tests reviewed and independently interpreted:  CXR 07/09/2021: Worsening anterior left upper lobe airspace opacities suggesting pneumonia.    Cardiac Studies:  Telemetry 07/11/2021: Afib   EKG 07/10/2021: Afib 89 bpm RBBB  Echocardiogram 07/07/2021:  1. Left ventricular ejection fraction, by estimation, is 35 to 40%. The  left ventricle has moderately decreased function. The left ventricle  demonstrates regional wall motion abnormalities (see scoring  diagram/findings for description). There is mild  left ventricular hypertrophy. Left ventricular diastolic parameters are  indeterminate. Elevated left ventricular end-diastolic pressure. There is  severe of the left ventricular, entire anterior wall. There is mild of the  left ventricular, mid-apical  anterolateral wall.   2. Right ventricular systolic function is normal. The right ventricular  size is  normal. There is normal pulmonary artery systolic pressure. The  estimated right ventricular systolic pressure is 66.0 mmHg.   3. Left atrial size was mildly dilated.   4. The mitral valve is normal in structure. Mild mitral valve  regurgitation.   5. The aortic valve is tricuspid. Aortic valve regurgitation is mild to  moderate. Aortic valve sclerosis/calcification is present, without any  evidence of aortic stenosis.   6. The inferior vena cava is normal in size with <50% respiratory  variability, suggesting right atrial pressure of 8 mmHg.   Comparison(s): Changes from prior study are noted. Compared to 08/23/2018,  LVEF 60-65% without regional wall motion abnormality. Mild MR and TR new.     Assessment & Recommendations:  88 y/o Caucasian female with hypertension, hyperlipidemia, type 2 DM, CAD s/p CABGX3, PAD,  h/o stroke, PAF, CKD 3, OSA on CPAP, admitted with hypoxic respiratory failure  Acute hypoxic respiratory failure: Multifactorial with acute on chronic HFrEF, pneumonia, Afib -2.6 L net negative. Continues to have opacities concerning for pneumonia on chest Xray.  Also has temp Tmax 100.1 F Also, back in Afib-fortunately rate controlled, since cardioversion Continue diuresis, okay to continue PO lasix 40 mg daily. Will use one dose of IV lasix 40 mg today. Continue metoprolol succinate-on 12.5 mg bid, Bidil 30 mg bid, Jardiance 10 mg daily. If and when renal function improves, could introduce Entresto or spironolactone.  HFrEF: New EF drop with regional WMA in the setting of mildly elevated troponin. Evaluated by Dr Einar Gip during the week and felt that AKI would preclude urgent  coronary angiography.  However, renal function has stabilized.  Her volume status is also improved.  Given concern for new ischemic cardiomyopathy, I do think she would benefit from left and right heart catheterization, and possible intervention if necessary, during this hospitalization in order to  avoid recurrent heart failure readmissions.    Stop Eliquis.  Resume heparin.  Plan for heart catheterization either Monday, 07/12/2021, or Wednesday, 07/14/2021, subjective Cath Lab availability.  Risks, benefits, alternate options discussed at length with patient's daughter Arletta Bale.  I will update the patient later today or tomorrow regarding her discussion.  PAF:  Back in Afib after cardioversion 07/08/2021. Rate controlled A-fib.  Okay to continue p.o. amiodarone, I will resume this. In future, could consider restoration of sinus rhythm after being on amiodarone for a few weeks. High CHA2DS2VASc score. Continue eliquis 5 mg bid  CAD: NSTEMI vs type 2 MI in the setting of new heart failure Continue plavix 75 mg daily. Prior CABG and PCI. Plan for cath this admission.  Time spent reviewing prior records, discussion with patient and family: 90 min  Discussed interpretation of tests and management recommendations with the primary team   Nigel Mormon, MD Pager: [phone] Office: [phone]

## 2021-07-11 NOTE — Progress Notes (Signed)
Mobility Specialist Progress Note    07/11/21 1502  Mobility  Activity Contraindicated/medical hold   Checked on pt x2 and sleeping heavily. Will f/u as appropriate.   Hildred Alamin Mobility Specialist

## 2021-07-12 ENCOUNTER — Encounter (INDEPENDENT_AMBULATORY_CARE_PROVIDER_SITE_OTHER): Payer: Medicare Other | Admitting: Ophthalmology

## 2021-07-12 ENCOUNTER — Encounter (HOSPITAL_COMMUNITY)
Admission: EM | Disposition: A | Discharge status: Home Health | Payer: Self-pay | Source: Home / Self Care | Attending: Internal Medicine

## 2021-07-12 DIAGNOSIS — I480 Paroxysmal atrial fibrillation: Secondary | ICD-10-CM | POA: Diagnosis not present

## 2021-07-12 DIAGNOSIS — I5033 Acute on chronic diastolic (congestive) heart failure: Secondary | ICD-10-CM | POA: Diagnosis not present

## 2021-07-12 DIAGNOSIS — I5020 Unspecified systolic (congestive) heart failure: Secondary | ICD-10-CM

## 2021-07-12 DIAGNOSIS — K219 Gastro-esophageal reflux disease without esophagitis: Secondary | ICD-10-CM | POA: Diagnosis not present

## 2021-07-12 DIAGNOSIS — I10 Essential (primary) hypertension: Secondary | ICD-10-CM | POA: Diagnosis not present

## 2021-07-12 DIAGNOSIS — J181 Lobar pneumonia, unspecified organism: Secondary | ICD-10-CM

## 2021-07-12 HISTORY — PX: RIGHT/LEFT HEART CATH AND CORONARY/GRAFT ANGIOGRAPHY: CATH118267

## 2021-07-12 LAB — POCT I-STAT 7, (LYTES, BLD GAS, ICA,H+H)
Acid-Base Excess: 7 mmol/L — ABNORMAL HIGH (ref 0.0–2.0)
Bicarbonate: 34.2 mmol/L — ABNORMAL HIGH (ref 20.0–28.0)
Calcium, Ion: 1.1 mmol/L — ABNORMAL LOW (ref 1.15–1.40)
HCT: 32 % — ABNORMAL LOW (ref 36.0–46.0)
Hemoglobin: 10.9 g/dL — ABNORMAL LOW (ref 12.0–15.0)
O2 Saturation: 96 %
Potassium: 3.4 mmol/L — ABNORMAL LOW (ref 3.5–5.1)
Sodium: 135 mmol/L (ref 135–145)
TCO2: 36 mmol/L — ABNORMAL HIGH (ref 22–32)
pCO2 arterial: 60.2 mmHg — ABNORMAL HIGH (ref 32–48)
pH, Arterial: 7.362 (ref 7.35–7.45)
pO2, Arterial: 87 mmHg (ref 83–108)

## 2021-07-12 LAB — POCT I-STAT EG7
Acid-Base Excess: 8 mmol/L — ABNORMAL HIGH (ref 0.0–2.0)
Acid-Base Excess: 8 mmol/L — ABNORMAL HIGH (ref 0.0–2.0)
Bicarbonate: 34.9 mmol/L — ABNORMAL HIGH (ref 20.0–28.0)
Bicarbonate: 35.1 mmol/L — ABNORMAL HIGH (ref 20.0–28.0)
Calcium, Ion: 1.06 mmol/L — ABNORMAL LOW (ref 1.15–1.40)
Calcium, Ion: 1.11 mmol/L — ABNORMAL LOW (ref 1.15–1.40)
HCT: 32 % — ABNORMAL LOW (ref 36.0–46.0)
HCT: 33 % — ABNORMAL LOW (ref 36.0–46.0)
Hemoglobin: 10.9 g/dL — ABNORMAL LOW (ref 12.0–15.0)
Hemoglobin: 11.2 g/dL — ABNORMAL LOW (ref 12.0–15.0)
O2 Saturation: 57 %
O2 Saturation: 58 %
Potassium: 3.3 mmol/L — ABNORMAL LOW (ref 3.5–5.1)
Potassium: 3.4 mmol/L — ABNORMAL LOW (ref 3.5–5.1)
Sodium: 136 mmol/L (ref 135–145)
Sodium: 137 mmol/L (ref 135–145)
TCO2: 37 mmol/L — ABNORMAL HIGH (ref 22–32)
TCO2: 37 mmol/L — ABNORMAL HIGH (ref 22–32)
pCO2, Ven: 63.3 mmHg — ABNORMAL HIGH (ref 44–60)
pCO2, Ven: 64.5 mmHg — ABNORMAL HIGH (ref 44–60)
pH, Ven: 7.345 (ref 7.25–7.43)
pH, Ven: 7.349 (ref 7.25–7.43)
pO2, Ven: 33 mmHg (ref 32–45)
pO2, Ven: 33 mmHg (ref 32–45)

## 2021-07-12 LAB — CBC
HCT: 33 % — ABNORMAL LOW (ref 36.0–46.0)
Hemoglobin: 10.4 g/dL — ABNORMAL LOW (ref 12.0–15.0)
MCH: 26 pg (ref 26.0–34.0)
MCHC: 31.5 g/dL (ref 30.0–36.0)
MCV: 82.5 fL (ref 80.0–100.0)
Platelets: 311 10*3/uL (ref 150–400)
RBC: 4 MIL/uL (ref 3.87–5.11)
RDW: 14.7 % (ref 11.5–15.5)
WBC: 8.7 10*3/uL (ref 4.0–10.5)
nRBC: 0 % (ref 0.0–0.2)

## 2021-07-12 LAB — BASIC METABOLIC PANEL
Anion gap: 8 (ref 5–15)
BUN: 26 mg/dL — ABNORMAL HIGH (ref 8–23)
CO2: 32 mmol/L (ref 22–32)
Calcium: 8.5 mg/dL — ABNORMAL LOW (ref 8.9–10.3)
Chloride: 94 mmol/L — ABNORMAL LOW (ref 98–111)
Creatinine, Ser: 1.38 mg/dL — ABNORMAL HIGH (ref 0.44–1.00)
GFR, Estimated: 37 mL/min — ABNORMAL LOW (ref >60)
Glucose, Bld: 141 mg/dL — ABNORMAL HIGH (ref 70–99)
Potassium: 3.6 mmol/L (ref 3.5–5.1)
Sodium: 134 mmol/L — ABNORMAL LOW (ref 135–145)

## 2021-07-12 LAB — GLUCOSE, CAPILLARY
Glucose-Capillary: 160 mg/dL — ABNORMAL HIGH (ref 70–99)
Glucose-Capillary: 253 mg/dL — ABNORMAL HIGH (ref 70–99)
Glucose-Capillary: 156 mg/dL — ABNORMAL HIGH (ref 70–99)
Glucose-Capillary: 204 mg/dL — ABNORMAL HIGH (ref 70–99)

## 2021-07-12 LAB — HEPARIN LEVEL (UNFRACTIONATED): Heparin Unfractionated: >1.1 IU/mL — ABNORMAL HIGH (ref 0.30–0.70)

## 2021-07-12 LAB — APTT: aPTT: 94 seconds — ABNORMAL HIGH (ref 24–36)

## 2021-07-12 SURGERY — RIGHT/LEFT HEART CATH AND CORONARY/GRAFT ANGIOGRAPHY
Anesthesia: LOCAL

## 2021-07-12 MED ORDER — LIDOCAINE HCL (PF) 1 % IJ SOLN
INTRAMUSCULAR | Status: AC
  Filled 2021-07-12: qty 30

## 2021-07-12 MED ORDER — MIDAZOLAM HCL 2 MG/2ML IJ SOLN
INTRAMUSCULAR | Status: DC | PRN
  Administered 2021-07-12: 1 mg via INTRAVENOUS

## 2021-07-12 MED ORDER — APIXABAN 5 MG PO TABS
5.0000 mg | ORAL_TABLET | Freq: Two times a day (BID) | ORAL | Status: DC
  Administered 2021-07-12 – 2021-07-13 (×2): 5 mg via ORAL
  Filled 2021-07-12 (×2): qty 1

## 2021-07-12 MED ORDER — HEPARIN (PORCINE) IN NACL 1000-0.9 UT/500ML-% IV SOLN
INTRAVENOUS | Status: DC | PRN
  Administered 2021-07-12 (×2): 500 mL

## 2021-07-12 MED ORDER — VERAPAMIL HCL 2.5 MG/ML IV SOLN
INTRAVENOUS | Status: AC
  Filled 2021-07-12: qty 2

## 2021-07-12 MED ORDER — FENTANYL CITRATE (PF) 100 MCG/2ML IJ SOLN
INTRAMUSCULAR | Status: DC | PRN
  Administered 2021-07-12: 25 ug via INTRAVENOUS

## 2021-07-12 MED ORDER — FENTANYL CITRATE (PF) 100 MCG/2ML IJ SOLN
INTRAMUSCULAR | Status: AC
  Filled 2021-07-12: qty 2

## 2021-07-12 MED ORDER — LIDOCAINE HCL (PF) 1 % IJ SOLN
INTRAMUSCULAR | Status: DC | PRN
  Administered 2021-07-12: 10 mL

## 2021-07-12 MED ORDER — ONDANSETRON HCL 4 MG/2ML IJ SOLN: 4.0000 mg | Freq: Four times a day (QID) | INTRAMUSCULAR | Status: DC | PRN

## 2021-07-12 MED ORDER — IOHEXOL 350 MG/ML SOLN
INTRAVENOUS | Status: DC | PRN
  Administered 2021-07-12: 45 mL

## 2021-07-12 MED ORDER — SODIUM CHLORIDE 0.9% FLUSH
3.0000 mL | INTRAVENOUS | Status: DC | PRN
Start: 2021-07-12 — End: 2021-07-13

## 2021-07-12 MED ORDER — ACETAMINOPHEN 325 MG PO TABS: 650.0000 mg | ORAL_TABLET | ORAL | Status: DC | PRN

## 2021-07-12 MED ORDER — SODIUM CHLORIDE 0.9% FLUSH: 3.0000 mL | INTRAVENOUS | Status: DC | PRN

## 2021-07-12 MED ORDER — SODIUM CHLORIDE 0.9 % IV SOLN
INTRAVENOUS | Status: AC
Start: 2021-07-12 — End: 2021-07-12

## 2021-07-12 MED ORDER — SODIUM CHLORIDE 0.9 % IV SOLN
250.0000 mL | INTRAVENOUS | Status: DC | PRN
  Administered 2021-07-12: 250 mL via INTRAVENOUS

## 2021-07-12 MED ORDER — MIDAZOLAM HCL 2 MG/2ML IJ SOLN
INTRAMUSCULAR | Status: AC
  Filled 2021-07-12: qty 2

## 2021-07-12 MED ORDER — SODIUM CHLORIDE 0.9% FLUSH: 3.0000 mL | Freq: Two times a day (BID) | INTRAVENOUS | Status: DC

## 2021-07-12 MED ORDER — HEPARIN SODIUM (PORCINE) 1000 UNIT/ML IJ SOLN
INTRAMUSCULAR | Status: AC
  Filled 2021-07-12: qty 10

## 2021-07-12 MED ORDER — HYDRALAZINE HCL 20 MG/ML IJ SOLN: 10.0000 mg | INTRAMUSCULAR | Status: AC | PRN

## 2021-07-12 MED ORDER — SODIUM CHLORIDE 0.9 % IV SOLN: 250.0000 mL | INTRAVENOUS | Status: DC | PRN

## 2021-07-12 MED ORDER — ASPIRIN 81 MG PO CHEW
81.0000 mg | CHEWABLE_TABLET | ORAL | Status: AC
  Administered 2021-07-12: 81 mg via ORAL
  Filled 2021-07-12: qty 1

## 2021-07-12 MED ORDER — LABETALOL HCL 5 MG/ML IV SOLN: 10.0000 mg | INTRAVENOUS | Status: AC | PRN

## 2021-07-12 MED ORDER — AMOXICILLIN-POT CLAVULANATE 500-125 MG PO TABS
1.0000 | ORAL_TABLET | Freq: Two times a day (BID) | ORAL | Status: DC
  Administered 2021-07-12 – 2021-07-13 (×2): 500 mg via ORAL
  Filled 2021-07-12 (×4): qty 1

## 2021-07-12 MED ORDER — SODIUM CHLORIDE 0.9% FLUSH
3.0000 mL | Freq: Two times a day (BID) | INTRAVENOUS | Status: DC
  Administered 2021-07-12 – 2021-07-13 (×2): 3 mL via INTRAVENOUS

## 2021-07-12 MED ORDER — APIXABAN 5 MG PO TABS: 5.0000 mg | ORAL_TABLET | Freq: Two times a day (BID) | ORAL | Status: DC

## 2021-07-12 MED ORDER — SODIUM CHLORIDE 0.9 % IV SOLN: INTRAVENOUS | Status: DC

## 2021-07-12 MED ORDER — HEPARIN (PORCINE) IN NACL 1000-0.9 UT/500ML-% IV SOLN
INTRAVENOUS | Status: AC
  Filled 2021-07-12: qty 1000

## 2021-07-12 SURGICAL SUPPLY — 18 items
CATH INFINITI 5 FR AR1 MOD (CATHETERS) ×1 IMPLANT
CATH INFINITI 5 FR IM (CATHETERS) ×1 IMPLANT
CATH INFINITI 5 FR JL3.5 (CATHETERS) ×1 IMPLANT
CATH INFINITI 5FR MULTPACK ANG (CATHETERS) ×1 IMPLANT
CATH SWAN GANZ 7F STRAIGHT (CATHETERS) ×1 IMPLANT
GUIDEWIRE .025 260CM (WIRE) ×1 IMPLANT
KIT HEART LEFT (KITS) ×2 IMPLANT
PACK CARDIAC CATHETERIZATION (CUSTOM PROCEDURE TRAY) ×2 IMPLANT
SHEATH PINNACLE 5F 10CM (SHEATH) ×1 IMPLANT
SHEATH PINNACLE 7F 10CM (SHEATH) ×1 IMPLANT
SHEATH PROBE COVER 6X72 (BAG) ×1 IMPLANT
TRANSDUCER W/STOPCOCK (MISCELLANEOUS) ×2 IMPLANT
TUBING CIL FLEX 10 FLL-RA (TUBING) ×2 IMPLANT
WIRE EMERALD 3MM-J .025X260CM (WIRE) ×1 IMPLANT
WIRE EMERALD 3MM-J .035X150CM (WIRE) ×2 IMPLANT
WIRE EMERALD 3MM-J .035X260CM (WIRE) ×1 IMPLANT
WIRE EMERALD ST .035X260CM (WIRE) ×1 IMPLANT
WIRE MICRO SET SILHO 5FR 7 (SHEATH) ×1 IMPLANT

## 2021-07-12 NOTE — Progress Notes (Signed)
Heart Failure Stewardship Pharmacist Progress Note   PCP: Shon Baton, MD PCP-Cardiologist: None    HPI:  88 yo F with PMH of CAD s/p CABG, HTN, afib, HLD, OSA, T2DM, and CKD II.  She presented to the ED on 6/27 with shortness of breath, O2 sat 70% at home, and cough. Also with burning sensations when urination - she was started on Macrobid for 7 days as outpatient on 6/26. Last dose of Keflex on 7/1.  ECHO 6/28 with LVEF 35-40% (dropped from 50-55% in 11/2019), mild LVH, mild MR, RV ok.  S/p DCCV on 6/29. Back in afib. Cath today.   Current HF Medications: Diuretic: furosemide 40 mg PO daily Beta Blocker: metoprolol XL 12.5 mg BID SGLT2i: Jardiance 10 mg daily Other: Isordil 30 mg BID  Prior to admission HF Medications: Diuretic: furosemide 20 mg daily Beta blocker: metoprolol XL 12.5 mg BID Other: Imdur 30 mg BID  Pertinent Lab Values: Serum creatinine 1.38, BUN 26, Potassium 3.6, Sodium 134, Magnesium 1.8 BNP 501.1>>205.2 A1c 7.8 Urinalysis clean for infection  Vital Signs: Weight: 151 lbs (admission weight: 165 lbs) Blood pressure: 120/50s  Heart rate: 70-80s - NSR I/O: -1.5L yesterday; net -4L  Medication Assistance / Insurance Benefits Check: Does the patient have prescription insurance?  Yes Type of insurance plan: [place]:  Prior to admission outpatient pharmacy: [facility] Is the patient willing to use [facility] at discharge? Yes Is the patient willing to transition their outpatient pharmacy to utilize a [facility] outpatient pharmacy?   Pending    Assessment: 1. Acute systolic CHF (LVEF 47-65%). R/LHC today. NYHA class II symptoms. - Volume improved - continue PO lasix. Keep K>4 and Mag>2. Recheck mag with AM labs tomorrow.  - Continue metoprolol XL 12.5 mg BID - Consider starting Entresto 24/26 mg BID prior to discharge if BP and SCr allow - Consider starting spironolactone 25 mg daily  - Continue Jardiance 10 mg daily -  preferred SGLT2i with [employer]. UA clear. - Continue Isordil 30 mg BID   Plan: 1) Medication changes recommended at this time: - None pending cath - Magnesium level in AM  2) Patient assistance: - None needed - has [employer]  3)  Education  - To be completed prior to discharge  Kerby Nora, PharmD, BCPS Heart Failure Cytogeneticist Phone [phone]

## 2021-07-12 NOTE — Progress Notes (Signed)
Mobility Specialist Progress Note:   07/12/21 1221  Mobility  Activity Off unit   Will follow-up as time allows.   [facility] Stepfanie Yott Mobility Specialist

## 2021-07-12 NOTE — Progress Notes (Signed)
Site area: rt groin arterial and venous sheaths Site Prior to Removal:  Level 0 Pressure Applied For: 20 minutes Manual:   yes Patient Status During Pull:  stable Post Pull Site:  Level 0 Post Pull Instructions Given:  yes Post Pull Pulses Present: rt dp palpable Dressing Applied:  gauze and tegaderm Bedrest begins @ 8335 Comments:

## 2021-07-12 NOTE — Progress Notes (Signed)
Subjective:  Still has cough. No fever or chills.  No further PND or orthopnea  Intake/Output from previous day:  I/O last 3 completed shifts: In: 419.5 [P.O.:360; I.V.:59.5] Out: 1500 [Urine:1500] Total I/O In: 168.5 [P.O.:120; I.V.:48.5] Out: 200 [Urine:200] Net IO Since Admission: -3,982.88 mL [07/12/21 1204]  Blood pressure (!) 113/52, pulse 82, temperature 97.8 F (36.6 C), temperature source Oral, resp. rate 20, height 5' (1.524 m), weight 68.5 kg, SpO2 97 %. Physical Exam Vitals reviewed.  Constitutional:      Appearance: She is well-developed.  Neck:     Vascular: No JVD.  Cardiovascular:     Rate and Rhythm: Normal rate and regular rhythm.     Pulses: Intact distal pulses.          Carotid pulses are  on the right side with bruit and  on the left side with bruit.      Radial pulses are 2+ on the right side and 2+ on the left side.       Dorsalis pedis pulses are 1+ on the right side and 1+ on the left side.       Posterior tibial pulses are 1+ on the right side and 2+ on the left side.     Heart sounds: Murmur heard.     Early systolic murmur is present with a grade of 2/6 at the upper right sternal border.     No gallop.  Pulmonary:     Effort: Pulmonary effort is normal. No accessory muscle usage or respiratory distress.     Breath sounds: Examination of the right-lower field reveals rales. Examination of the left-lower field reveals rales. Decreased breath sounds and rales present. No wheezing or rhonchi.  Abdominal:     General: Abdomen is flat.     Palpations: Abdomen is soft.  Musculoskeletal:     Right shoulder: Normal.     Right lower leg: No edema.     Left lower leg: No edema.     Lab Results: BMP BNP (last 3 results) Recent Labs    07/06/21 2330 07/08/21 0628 07/09/21 0705  BNP 501.1* 275.2* 205.2*     ProBNP (last 3 results) No results for input(s): "PROBNP" in the last 8760 hours.    Latest Ref Rng & Units 07/12/2021    7:39 AM 07/11/2021     4:12 AM 07/10/2021    3:51 AM  BMP  Glucose 70 - 99 mg/dL 141  165  137   BUN 8 - 23 mg/dL _0 Creatinine 0.44 - 1.00 mg/dL 1.38  1.35  1.39   Sodium 135 - 145 mmol/L 134  137  134   Potassium 3.5 - 5.1 mmol/L 3.6  3.3  3.8   Chloride 98 - 111 mmol/L 94  90  93   CO2 22 - 32 mmol/L 32  32  30   Calcium 8.9 - 10.3 mg/dL 8.5  8.7  8.3       Latest Ref Rng & Units 07/08/2021    3:47 PM 07/06/2021   11:30 PM 08/23/2018    4:34 AM  Hepatic Function  Total Protein 6.5 - 8.1 g/dL 6.6  6.9  6.0   Albumin 3.5 - 5.0 g/dL 2.7  3.1  2.9   AST 15 - 41 U/L 34  44  15   ALT 0 - 44 U/L _1 Alk Phosphatase 38 - 126 U/L 115  134  100  Total Bilirubin 0.3 - 1.2 mg/dL 1.4  1.4  0.4   Bilirubin, Direct 0.0 - 0.2 mg/dL 0.3         Latest Ref Rng & Units 07/12/2021    7:39 AM 07/09/2021    7:05 AM 07/08/2021    6:28 AM  CBC  WBC 4.0 - 10.5 K/uL 8.7  15.2  9.8   Hemoglobin 12.0 - 15.0 g/dL 10.4  10.2  10.1   Hematocrit 36.0 - 46.0 % 33.0  32.8  31.6   Platelets 150 - 400 K/uL 311  279  253    Lipid Panel     Component Value Date/Time   CHOL 81 07/09/2021 0705   TRIG 76 07/09/2021 0705   HDL 31 (L) 07/09/2021 0705   CHOLHDL 2.6 07/09/2021 0705   VLDL 15 07/09/2021 0705   LDLCALC 35 07/09/2021 0705   Cardiac Panel (last 3 results) No results for input(s): "CKTOTAL", "CKMB", "TROPONINI", "RELINDX" in the last 72 hours.  HEMOGLOBIN A1C Lab Results  Component Value Date   HGBA1C 7.8 (H) 07/07/2021   MPG 177.16 07/07/2021   TSH Recent Labs    07/08/21 1547  TSH 1.097     Scheduled Meds:  amiodarone  200 mg Oral BID   amoxicillin-clavulanate  1 tablet Oral Q12H   clopidogrel  75 mg Oral Daily   DULoxetine  60 mg Oral Daily   empagliflozin  10 mg Oral Daily   ezetimibe  10 mg Oral Daily   ferrous sulfate  325 mg Oral Daily   fesoterodine  8 mg Oral QPM   furosemide  40 mg Oral Daily   gabapentin  300 mg Oral BID   hydrOXYzine  25 mg Oral Daily   hydrOXYzine   50 mg Oral QHS   insulin aspart  0-5 Units Subcutaneous QHS   insulin aspart  0-9 Units Subcutaneous TID WC   insulin glargine-yfgn  13 Units Subcutaneous Daily   isosorbide dinitrate  30 mg Oral BID   linagliptin  5 mg Oral Daily   metoprolol succinate  12.5 mg Oral BID   pantoprazole  40 mg Oral Daily   rosuvastatin  10 mg Oral QAC supper   sodium chloride flush  3 mL Intravenous Q12H   sodium chloride flush  3 mL Intravenous Q12H   Continuous Infusions:  sodium chloride     sodium chloride     sodium chloride 50 mL/hr at 07/12/21 1027   heparin 1,000 Units/hr (07/12/21 0851)   PRN Meds:.sodium chloride, sodium chloride, acetaminophen, acetaminophen, fluticasone, loratadine, prochlorperazine, sodium chloride flush, sodium chloride flush  Radiology:    [facility] Chest Port 1 View 07/07/2021 CLINICAL DATA:  Shortness of breath. EXAM: PORTABLE CHEST 1 VIEW COMPARISON:  10/07/2019 FINDINGS: Patient is post median sternotomy. Planted loop recorder in the left chest wall. Mild cardiomegaly with unchanged mediastinal contours. Moderate pulmonary edema. Small pleural effusions and fluid in the fissure. No pneumothorax. IMPRESSION: CHF with moderate pulmonary edema and small pleural effusions. Electronically Signed   By: Keith Rake M.D.   On: 07/07/2021 00:10     Cardiac Studies:    Coronary angiogram 03/05/2021: LM: Trifurcates.  Ostium 80 to 90% stenosis. LAD: Heavy calcification is noted with ostial 90% stenosis.  Complete filling from the LIMA is noted. LCx: Large vessel, no significant disease. RI: Large vessel, bifurcating vessel, ostial 80 to 90% stenosis.  Competitive filling from the vein graft is seen. RCA: Large-caliber vessel and a dominant vessel.  70% stenosis in  the ostium and mid RCA has a sequential 80 to 90% stenosis. LIMA to LAD patent. SVG to RI widely patent. SVG to RCA severely diffusely diseased and nonfunctional graft.   Successful IVUS guided PCI of the proximal  to distal right coronary artery using 3.5 x 38 and a 3.5 x 38 mm Synergy DES.   Echocardiogram 07/07/2021:   1. Left ventricular ejection fraction, by estimation, is 35 to 40%. The left ventricle has moderately decreased function. The left ventricle demonstrates regional wall motion abnormalities (see scoring diagram/findings for description). There is mild  left ventricular hypertrophy. Left ventricular diastolic parameters are indeterminate. Elevated left ventricular end-diastolic pressure. There is severe of the left ventricular, entire anterior wall. There is mild of the left ventricular, mid-apical  anterolateral wall.  2. Right ventricular systolic function is normal. The right ventricular size is normal. There is normal pulmonary artery systolic pressure. The estimated right ventricular systolic pressure is 59.7 mmHg.  3. Left atrial size was mildly dilated.  4. The mitral valve is normal in structure. Mild mitral valve regurgitation.  5. The aortic valve is tricuspid. Aortic valve regurgitation is mild to moderate. Aortic valve sclerosis/calcification is present, without any evidence of aortic stenosis.  6. The inferior vena cava is normal in size with <50% respiratory variability, suggesting right atrial pressure of 8 mmHg.   Comparison(s): Changes from prior study are noted. Compared to 08/23/2018, LVEF 60-65% without regional wall motion abnormality. Mild MR and TR new.  Compared to 11/27/2019, LVEF is 50 to 55%.  There was no wall motion abnormality.  Otherwise no significant change.   EKG:   EKG 07/06/2021: Probably normal sinus rhythm at rate of 99 bpm, left atrial abnormality.  Right bundle branch block.  ST depression V4 to V6, cannot exclude lateral ischemia.  Compared to the EKG done on 03/11/2021, no significant change, inferior ST segment depression not present.   Assessment    Linda Oliver is a 88 y.o. 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  Admitted with unstable angina and underwent coronary angiography on 03/05/2021 and overlapping stent plantation to the right coronary artery native as the SVG to RCA was functionally occluded.  Left grafts are widely patent.    1.  NSTEMI 2.  Coronary artery disease of the native vessel with unstable angina pectoris 3.  Acute pulmonary edema 4.  Paroxysmal atrial fibrillation now has been persistent atrial fibrillation since hospital admission             CHA2DS2-VASc Score is 8.  Yearly risk of stroke: > 10% (F, A, HTN, DM, Vasc Dz, CHF).  Score of 1=0.6; 2=2.2; 3=3.2; 4=4.8; 5=7.2; 6=9.8; 7=>9.8) -(CHF; HTN; vasc disease DM,  Female = 1; Age <65 =0; 65-74 = 1,  >75 =2; stroke/embolism= 2). 5. Acute systolic heart failure   Recommendations:    Patient appears well compensated with regard to congestive heart failure and pulmonary edema.  Upon review of the wall motion abnormality on the echocardiogram, elevation in cardiac troponins where only marginal compared to the wall motion abnormality and reduced LVEF.  I am also beginning to wonder if patient has Takotsubo cardiomyopathy.  Atrial fibrillation is  rate controlled, no clinical evidence of active decompensated heart failure, suspect she probably has mild pneumonia as well which is responding to present antibiotic therapy.  With regard to chronic stage IIIa-B chronic kidney disease, she is presently doing well and stable serum creatinine, best option is to proceed with cardiac catheterization today to evaluate her coronary anatomy.  We will make further discussions after this.    Adrian Prows, MD, [facility] 07/12/2021, 12:04 PM Office: [phone] Fax:  [phone] Pager: [phone]

## 2021-07-12 NOTE — Progress Notes (Addendum)
Progress Note   Patient: Linda Oliver JQZ:[id number] DOB: [birth year] DOA: 07/06/2021     5 DOS: the patient was seen and examined on 07/12/2021   Brief hospital course: Linda Oliver was admitted to the hospital with the working diagnosis of decompensated heart failure.   88 yo female with the past medical history of CAD, sp CABG, paroxysmal atrial fibrillation, CKD and T2DM who presented with dyspnea and cough. Reported 4 days of dry cough and dyspnea. Worsening symptoms. On the day of admission his 02 saturation was in the 70's and EMS was called. In the Ed he was in respiratory distress and was placed on Bipap, with improving oxygenation to 98%, blood pressure 137/54, HR 90, RR 27. Lungs with rales and increased work of breathing, no wheezing, heart with S1 and S2 present and rhythmic with no gallops, abdomen not distended and no lower extremity edema.   Chest radiograph with bilateral hilar vascular congestion, bilateral interstitial infiltrates. Cephalization of the vasculature.   EKG 99 bpm, right axis deviation, with right bundle branch block. Qtc 514, sinus rhythm with no significant ST segment or T wave changes.   Patient has been placed on furosemide for diuresis with improvement of her symptoms.  She required intermittent Bipap for increase work of breathing.   Chest film with left upper lobe infiltrate, consistent with community acquired pneumonia.   07/03 cardiac catheterization, part of ischemia work up.    Assessment and Plan: * Acute on chronic diastolic CHF (congestive heart failure) (HCC) Echocardiogram with reduced LV systolic function with EF 35 to 40%, positive regional wall motion abnormalities. Preserved RV systolic function. No significant valvular disease.   Urine output is 6.226 ml Systolic blood pressure 333 to 143 mmHg.   Continue medical therapy with empagliflozin, isosorbide and metoprolol.  Diuresis with furosemide 40 mg daily.   Paroxysmal atrial  fibrillation (HCC) S/P cardioversion on 07/09/21  Continue rate control with metoprolol and rhythm control with amiodarone.  Continue anticoagulation with apixaban.   Prolonged Qtc to 511 in the setting of right bundle branch block.   Essential hypertension Continue blood pressure control with metoprolol and isosorbide.   GERD (gastroesophageal reflux disease) - Continue PPI.  Lobar pneumonia ([place]) Follow up chest radiograph with left upper lobe infiltrate.  Chest film personally reviewed, noted improvement in pulmonary edema but persistent infiltrate on the left upper lobe.   Plan to continue with Augmentin for a total of 5 days.   NSTEMI (non-ST elevated myocardial infarction) [facility]) Patient was placed on heparin for medical therapy. Currently is chest pain free.   Complete ischemic work up today with cardiac catheterization.   Type 2 diabetes mellitus with hyperlipidemia (HCC) Her fasting glucose is 141 mg/dl. Capillary 187, 256, 160 and 253.   Basal insulin 13 units and insulin sliding scale for glucose cover and monitoring.  Continue with linagliptin.  Will keep current dose of insulin, patient has been NPO for procedures and risk for hypoglycemia.   Continue with rosuvastatin.   OSA on CPAP Patient declined Cpap at night.  Use as needed as tolerated.   UTI (urinary tract infection) Completed antibiotic therapy.   Obesity (BMI 30-39.9) - Low calorie diet and portion control discussed with patient -Body mass index is 31.78 kg/m.   Acute kidney injury superimposed on chronic kidney disease (HCC) CKD stage 3a. Hypokalemia.   Improved volume status, renal function with serum cr at 1.38, K is 3,6 and serum bicarbonate at 32. Follow up renal panel in  am.         Subjective: Patient is feeling better, this am is out of bed to the chair and her daughter is at the baseline, her dyspnea and lower extremity edema have improved.   Physical Exam: Vitals:    07/12/21 0600 07/12/21 0808 07/12/21 1042 07/12/21 1219  BP: 122/62 (!) 170/59 (!) 113/52   Pulse: 79 83 82   Resp: '20 20 20   '$ Temp: 97.8 F (36.6 C) 97.9 F (36.6 C) 97.8 F (36.6 C)   TempSrc: Oral Oral Oral   SpO2: 97% 98% 97% 96%  Weight:      Height:       Neurology awake and alert ENT with mild pallor Cardiovascular with S1 and S2 present and rhythmic, no gallops or murmurs Respiratory with no rales or rhonchi Abdomen not distended No lower extremity edema.  Data Reviewed:    Family Communication: I spoke with patient's daughter at the bedside, we talked in detail about patient's condition, plan of care and prognosis and all questions were addressed.   Disposition: Status is: Inpatient Remains inpatient appropriate because: cardiac catheterization   Planned Discharge Destination: Home    Author: Tawni Millers, MD 07/12/2021 12:38 PM  For on call review [email or web address].

## 2021-07-12 NOTE — Progress Notes (Signed)
Inpatient Diabetes Program Recommendations  AACE/ADA: New Consensus Statement on Inpatient Glycemic Control (2015)  Target Ranges:  Prepandial:   less than 140 mg/dL      Peak postprandial:   less than 180 mg/dL (1-2 hours)      Critically ill patients:  140 - 180 mg/dL   Lab Results  Component Value Date   GLUCAP 253 (H) 07/12/2021   HGBA1C 7.8 (H) 07/07/2021    Review of Glycemic Control  Latest Reference Range & Units 07/11/21 06:28 07/11/21 11:39 07/11/21 16:26 07/11/21 21:48 07/12/21 06:53 07/12/21 11:13  Glucose-Capillary 70 - 99 mg/dL 164 (H) 231 (H) 187 (H) 256 (H) 160 (H) 253 (H)   Diabetes history: DM 2 Outpatient Diabetes medications:   Amaryl 1 mg daily Lantus 25 units daily Januvia 100 mg daily Current orders for Inpatient glycemic control:  Novolog sensitive tid with meals and HS Jardiance 10 mg daily Semglee 13 units daily Tradjenta 5 mg daily Inpatient Diabetes Program Recommendations:     Consider slight increase of Semglee to 18 units daily.   Thanks,  Adah Perl, RN, BC-ADM Inpatient Diabetes Coordinator Pager [phone]  (8a-5p)

## 2021-07-12 NOTE — Interval H&P Note (Signed)
History and Physical Interval Note:  07/12/2021 12:18 PM  Linda Oliver  has presented today for surgery, with the diagnosis of unstable angina.  The various methods of treatment have been discussed with the patient and family. After consideration of risks, benefits and other options for treatment, the patient has consented to  Procedure(s): RIGHT/LEFT HEART CATH AND CORONARY/GRAFT ANGIOGRAPHY (N/A) as a surgical intervention.  The patient's history has been reviewed, patient examined, no change in status, stable for surgery.  I have reviewed the patient's chart and labs.  Questions were answered to the patient's satisfaction.    2012 Appropriate Use Criteria for Diagnostic Catheterization Home / Select Test of Interest Indication for RHC Cardiomyopathies Cardiomyopathies (Right and Left Heart Catheterization OR Right Heart Catheterization Alone With/Wit Cardiomyopathies (Right and Left Heart Catheterization OR Right Heart Catheterization Alone With/Without Left Ventriculography and Coronary Angiography) Link Here: [email or web address] Indication:  Re-evaluation of known cardiomyopathy Change in clinical status or cardiac exam or to guide therapy A (7) Indication: 94; Score 7   Luciano Cinquemani J Dillyn Joaquin

## 2021-07-12 NOTE — Evaluation (Signed)
Occupational Therapy Evaluation Patient Details Name: Linda Oliver MRN: [id number] DOB: January 06, 1933 Today's Date: 07/12/2021   History of Present Illness 88 y.o. female presents to [facility] hospital on 07/06/2021 with dyspnea, cough and fatigue. Pt hypoxic in ED requiring BiPAP, chet x-ray with pulmonary edema, troponins elevated. Pt admitted for management of NSTEMI, CHF. PMH includes anxiety, CAD, depression, HTN, PAF, seizure, DMII.   Clinical Impression   PTA, pt was living with her daughter and was performing ADLs and using rollator vs cane for mobility. Pt currently requiring Min A for ADLs and Min guard-Min A for functional mobility with RW. Pt reporting she feels weaker and more fatigued than her baseline. Pt demonstrating slower processing and weakness requiring increased time throughout. Pt would benefit from further acute OT to facilitate safe dc. Recommend dc to home with HHOT for further OT to optimize safety, independence with ADLs, and return to PLOF.      Recommendations for follow up therapy are one component of a multi-disciplinary discharge planning process, led by the attending physician.  Recommendations may be updated based on patient status, additional functional criteria and insurance authorization.   Follow Up Recommendations  Home health OT    Assistance Recommended at Discharge Frequent or constant Supervision/Assistance  Patient can return home with the following      Functional Status Assessment  Patient has had a recent decline in their functional status and demonstrates the ability to make significant improvements in function in a reasonable and predictable amount of time.  Equipment Recommendations  None recommended by OT    Recommendations for Other Services PT consult     Precautions / Restrictions Precautions Precautions: Fall Precaution Comments: monitor HR      Mobility Bed Mobility Overal bed mobility: Needs Assistance Bed Mobility: Supine to Sit      Supine to sit: Min assist, HOB elevated     General bed mobility comments: Min A for elevating trunk; pt sliding herself too far down to achieve pushing into uprigh posture demonstrating decreased problem solving    Transfers Overall transfer level: Needs assistance Equipment used: Rolling walker (2 wheels) Transfers: Sit to/from Stand Sit to Stand: Min guard           General transfer comment: Min Guard A for safety      Balance Overall balance assessment: Needs assistance Sitting-balance support: No upper extremity supported, Feet supported Sitting balance-Leahy Scale: Good     Standing balance support: No upper extremity supported, During functional activity, Bilateral upper extremity supported Standing balance-Leahy Scale: Poor Standing balance comment: Pt not wanting to perform bilateral tasks in standing and holding fast to walker with non-dominant hand thorughout grooming at sink                           ADL either performed or assessed with clinical judgement   ADL Overall ADL's : Needs assistance/impaired Eating/Feeding: Set up;Sitting   Grooming: Oral care;Wash/dry face;Standing;Min guard;Cueing for sequencing;Minimal assistance Grooming Details (indicate cue type and reason): Close MIn Guard A for safety at sink. Pt demonstrating slow processing and weakness while at sink. Pt needing Min A for bilateral tasks and dropping items such as the toothpaste (x3). Upper Body Bathing: Minimal assistance;Sitting   Lower Body Bathing: Minimal assistance;Sit to/from stand   Upper Body Dressing : Minimal assistance;Sitting   Lower Body Dressing: Minimal assistance;Sit to/from stand   Toilet Transfer: Min guard;Ambulation;Rolling walker (2 wheels) (simulated to recliner)  Functional mobility during ADLs: Minimal assistance;Min guard;Rolling walker (2 wheels) General ADL Comments: Pt presenting with decreased acitivty tolerance, weakness, and  slow processing.     Vision         Perception     Praxis      Pertinent Vitals/Pain Pain Assessment Pain Assessment: No/denies pain     Hand Dominance Right   Extremity/Trunk Assessment Upper Extremity Assessment Upper Extremity Assessment: Overall WFL for tasks assessed   Lower Extremity Assessment Lower Extremity Assessment: Defer to PT evaluation   Cervical / Trunk Assessment Cervical / Trunk Assessment: Kyphotic   Communication Communication Communication: HOH   Cognition Arousal/Alertness: Awake/alert Behavior During Therapy: WFL for tasks assessed/performed Overall Cognitive Status: Impaired/Different from baseline Area of Impairment: Problem solving                             Problem Solving: Slow processing General Comments: Requiring increased time for problem solving. Able to complete simple tasks such as washing her face or manaing RW but needing increased time. Pt demonstrating awareness to report she feels weaker than her baseline     General Comments  SpO2 dropping to 87-92% on RA. With 2L O2, able to maintain in 90s.    Exercises     Shoulder Instructions      Home Living Family/patient expects to be discharged to:: Private residence Living Arrangements: Children Available Help at Discharge: Family;Available 24 hours/day Type of Home: House Home Access: Stairs to enter [employer] of Steps: 3 Entrance Stairs-Rails: Can reach both Home Layout: Two level;Able to live on main level with bedroom/bathroom     Bathroom Shower/Tub: Occupational psychologist: Handicapped height Bathroom Accessibility: No   Home Equipment: Rollator (4 wheels);Cane - quad;Shower seat;Other (comment) (3 wheel walker)   Additional Comments: Has an apartment build onto her daughters home      Prior Functioning/Environment Prior Level of Function : Needs assist             Mobility Comments: pt ambulates with quad cane in  the home, utilizes a 3 wheeled walker in the community ADLs Comments: Performs ADLs. Daughter assists with IADLs        OT Problem List: Decreased strength;Decreased range of motion;Decreased activity tolerance;Impaired balance (sitting and/or standing);Decreased knowledge of use of DME or AE;Decreased knowledge of precautions      OT Treatment/Interventions: Self-care/ADL training;Therapeutic exercise;Energy conservation;DME and/or AE instruction;Therapeutic activities;Patient/family education    OT Goals(Current goals can be found in the care plan section) Acute Rehab OT Goals Patient Stated Goal: Go home OT Goal Formulation: With patient Time For Goal Achievement: 07/26/21 Potential to Achieve Goals: Good  OT Frequency: Min 2X/week    Co-evaluation              AM-PAC OT "6 Clicks" Daily Activity     Outcome Measure Help from another person eating meals?: None Help from another person taking care of personal grooming?: A Little Help from another person toileting, which includes using toliet, bedpan, or urinal?: A Little Help from another person bathing (including washing, rinsing, drying)?: A Little Help from another person to put on and taking off regular upper body clothing?: A Little Help from another person to put on and taking off regular lower body clothing?: A Little 6 Click Score: 19   End of Session Equipment Utilized During Treatment: Rolling walker (2 wheels);Gait belt;Oxygen Nurse Communication: Mobility status  Activity Tolerance: Patient  tolerated treatment well Patient left: in chair;with call bell/phone within reach;with chair alarm set  OT Visit Diagnosis: Unsteadiness on feet (R26.81);Other abnormalities of gait and mobility (R26.89);Muscle weakness (generalized) (M62.81)                Time: 4562-5638 OT Time Calculation (min): 29 min Charges:  OT General Charges $OT Visit: 1 Visit OT Evaluation $OT Eval Moderate Complexity: 1 Mod OT  Treatments $Self Care/Home Management : 8-22 mins  Yumi Insalaco MSOT, OTR/L Acute Rehab Office: [place] 07/12/2021, 9:07 AM

## 2021-07-12 NOTE — H&P (View-Only) (Signed)
Subjective:  Still has cough. No fever or chills.  No further PND or orthopnea  Intake/Output from previous day:  I/O last 3 completed shifts: In: 419.5 [P.O.:360; I.V.:59.5] Out: 1500 [Urine:1500] Total I/O In: 168.5 [P.O.:120; I.V.:48.5] Out: 200 [Urine:200] Net IO Since Admission: -3,982.88 mL [07/12/21 1204]  Blood pressure (!) 113/52, pulse 82, temperature 97.8 F (36.6 C), temperature source Oral, resp. rate 20, height 5' (1.524 m), weight 68.5 kg, SpO2 97 %. Physical Exam Vitals reviewed.  Constitutional:      Appearance: She is well-developed.  Neck:     Vascular: No JVD.  Cardiovascular:     Rate and Rhythm: Normal rate and regular rhythm.     Pulses: Intact distal pulses.          Carotid pulses are  on the right side with bruit and  on the left side with bruit.      Radial pulses are 2+ on the right side and 2+ on the left side.       Dorsalis pedis pulses are 1+ on the right side and 1+ on the left side.       Posterior tibial pulses are 1+ on the right side and 2+ on the left side.     Heart sounds: Murmur heard.     Early systolic murmur is present with a grade of 2/6 at the upper right sternal border.     No gallop.  Pulmonary:     Effort: Pulmonary effort is normal. No accessory muscle usage or respiratory distress.     Breath sounds: Examination of the right-lower field reveals rales. Examination of the left-lower field reveals rales. Decreased breath sounds and rales present. No wheezing or rhonchi.  Abdominal:     General: Abdomen is flat.     Palpations: Abdomen is soft.  Musculoskeletal:     Right shoulder: Normal.     Right lower leg: No edema.     Left lower leg: No edema.     Lab Results: BMP BNP (last 3 results) Recent Labs    07/06/21 2330 07/08/21 0628 07/09/21 0705  BNP 501.1* 275.2* 205.2*     ProBNP (last 3 results) No results for input(s): "PROBNP" in the last 8760 hours.    Latest Ref Rng & Units 07/12/2021    7:39 AM 07/11/2021     4:12 AM 07/10/2021    3:51 AM  BMP  Glucose 70 - 99 mg/dL 141  165  137   BUN 8 - 23 mg/dL _0 Creatinine 0.44 - 1.00 mg/dL 1.38  1.35  1.39   Sodium 135 - 145 mmol/L 134  137  134   Potassium 3.5 - 5.1 mmol/L 3.6  3.3  3.8   Chloride 98 - 111 mmol/L 94  90  93   CO2 22 - 32 mmol/L 32  32  30   Calcium 8.9 - 10.3 mg/dL 8.5  8.7  8.3       Latest Ref Rng & Units 07/08/2021    3:47 PM 07/06/2021   11:30 PM 08/23/2018    4:34 AM  Hepatic Function  Total Protein 6.5 - 8.1 g/dL 6.6  6.9  6.0   Albumin 3.5 - 5.0 g/dL 2.7  3.1  2.9   AST 15 - 41 U/L 34  44  15   ALT 0 - 44 U/L _1 Alk Phosphatase 38 - 126 U/L 115  134  100  Total Bilirubin 0.3 - 1.2 mg/dL 1.4  1.4  0.4   Bilirubin, Direct 0.0 - 0.2 mg/dL 0.3         Latest Ref Rng & Units 07/12/2021    7:39 AM 07/09/2021    7:05 AM 07/08/2021    6:28 AM  CBC  WBC 4.0 - 10.5 K/uL 8.7  15.2  9.8   Hemoglobin 12.0 - 15.0 g/dL 10.4  10.2  10.1   Hematocrit 36.0 - 46.0 % 33.0  32.8  31.6   Platelets 150 - 400 K/uL 311  279  253    Lipid Panel     Component Value Date/Time   CHOL 81 07/09/2021 0705   TRIG 76 07/09/2021 0705   HDL 31 (L) 07/09/2021 0705   CHOLHDL 2.6 07/09/2021 0705   VLDL 15 07/09/2021 0705   LDLCALC 35 07/09/2021 0705   Cardiac Panel (last 3 results) No results for input(s): "CKTOTAL", "CKMB", "TROPONINI", "RELINDX" in the last 72 hours.  HEMOGLOBIN A1C Lab Results  Component Value Date   HGBA1C 7.8 (H) 07/07/2021   MPG 177.16 07/07/2021   TSH Recent Labs    07/08/21 1547  TSH 1.097     Scheduled Meds:  amiodarone  200 mg Oral BID   amoxicillin-clavulanate  1 tablet Oral Q12H   clopidogrel  75 mg Oral Daily   DULoxetine  60 mg Oral Daily   empagliflozin  10 mg Oral Daily   ezetimibe  10 mg Oral Daily   ferrous sulfate  325 mg Oral Daily   fesoterodine  8 mg Oral QPM   furosemide  40 mg Oral Daily   gabapentin  300 mg Oral BID   hydrOXYzine  25 mg Oral Daily   hydrOXYzine   50 mg Oral QHS   insulin aspart  0-5 Units Subcutaneous QHS   insulin aspart  0-9 Units Subcutaneous TID WC   insulin glargine-yfgn  13 Units Subcutaneous Daily   isosorbide dinitrate  30 mg Oral BID   linagliptin  5 mg Oral Daily   metoprolol succinate  12.5 mg Oral BID   pantoprazole  40 mg Oral Daily   rosuvastatin  10 mg Oral QAC supper   sodium chloride flush  3 mL Intravenous Q12H   sodium chloride flush  3 mL Intravenous Q12H   Continuous Infusions:  sodium chloride     sodium chloride     sodium chloride 50 mL/hr at 07/12/21 1027   heparin 1,000 Units/hr (07/12/21 0851)   PRN Meds:.sodium chloride, sodium chloride, acetaminophen, acetaminophen, fluticasone, loratadine, prochlorperazine, sodium chloride flush, sodium chloride flush  Radiology:    [facility] Chest Port 1 View 07/07/2021 CLINICAL DATA:  Shortness of breath. EXAM: PORTABLE CHEST 1 VIEW COMPARISON:  10/07/2019 FINDINGS: Patient is post median sternotomy. Planted loop recorder in the left chest wall. Mild cardiomegaly with unchanged mediastinal contours. Moderate pulmonary edema. Small pleural effusions and fluid in the fissure. No pneumothorax. IMPRESSION: CHF with moderate pulmonary edema and small pleural effusions. Electronically Signed   By: Keith Rake M.D.   On: 07/07/2021 00:10     Cardiac Studies:    Coronary angiogram 03/05/2021: LM: Trifurcates.  Ostium 80 to 90% stenosis. LAD: Heavy calcification is noted with ostial 90% stenosis.  Complete filling from the LIMA is noted. LCx: Large vessel, no significant disease. RI: Large vessel, bifurcating vessel, ostial 80 to 90% stenosis.  Competitive filling from the vein graft is seen. RCA: Large-caliber vessel and a dominant vessel.  70% stenosis in  the ostium and mid RCA has a sequential 80 to 90% stenosis. LIMA to LAD patent. SVG to RI widely patent. SVG to RCA severely diffusely diseased and nonfunctional graft.   Successful IVUS guided PCI of the proximal  to distal right coronary artery using 3.5 x 38 and a 3.5 x 38 mm Synergy DES.   Echocardiogram 07/07/2021:   1. Left ventricular ejection fraction, by estimation, is 35 to 40%. The left ventricle has moderately decreased function. The left ventricle demonstrates regional wall motion abnormalities (see scoring diagram/findings for description). There is mild  left ventricular hypertrophy. Left ventricular diastolic parameters are indeterminate. Elevated left ventricular end-diastolic pressure. There is severe of the left ventricular, entire anterior wall. There is mild of the left ventricular, mid-apical  anterolateral wall.  2. Right ventricular systolic function is normal. The right ventricular size is normal. There is normal pulmonary artery systolic pressure. The estimated right ventricular systolic pressure is 59.7 mmHg.  3. Left atrial size was mildly dilated.  4. The mitral valve is normal in structure. Mild mitral valve regurgitation.  5. The aortic valve is tricuspid. Aortic valve regurgitation is mild to moderate. Aortic valve sclerosis/calcification is present, without any evidence of aortic stenosis.  6. The inferior vena cava is normal in size with <50% respiratory variability, suggesting right atrial pressure of 8 mmHg.   Comparison(s): Changes from prior study are noted. Compared to 08/23/2018, LVEF 60-65% without regional wall motion abnormality. Mild MR and TR new.  Compared to 11/27/2019, LVEF is 50 to 55%.  There was no wall motion abnormality.  Otherwise no significant change.   EKG:   EKG 07/06/2021: Probably normal sinus rhythm at rate of 99 bpm, left atrial abnormality.  Right bundle branch block.  ST depression V4 to V6, cannot exclude lateral ischemia.  Compared to the EKG done on 03/11/2021, no significant change, inferior ST segment depression not present.   Assessment    Linda Oliver is a 88 y.o. 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  Admitted with unstable angina and underwent coronary angiography on 03/05/2021 and overlapping stent plantation to the right coronary artery native as the SVG to RCA was functionally occluded.  Left grafts are widely patent.    1.  NSTEMI 2.  Coronary artery disease of the native vessel with unstable angina pectoris 3.  Acute pulmonary edema 4.  Paroxysmal atrial fibrillation now has been persistent atrial fibrillation since hospital admission             CHA2DS2-VASc Score is 8.  Yearly risk of stroke: > 10% (F, A, HTN, DM, Vasc Dz, CHF).  Score of 1=0.6; 2=2.2; 3=3.2; 4=4.8; 5=7.2; 6=9.8; 7=>9.8) -(CHF; HTN; vasc disease DM,  Female = 1; Age <65 =0; 65-74 = 1,  >75 =2; stroke/embolism= 2). 5. Acute systolic heart failure   Recommendations:    Patient appears well compensated with regard to congestive heart failure and pulmonary edema.  Upon review of the wall motion abnormality on the echocardiogram, elevation in cardiac troponins where only marginal compared to the wall motion abnormality and reduced LVEF.  I am also beginning to wonder if patient has Takotsubo cardiomyopathy.  Atrial fibrillation is  rate controlled, no clinical evidence of active decompensated heart failure, suspect she probably has mild pneumonia as well which is responding to present antibiotic therapy.  With regard to chronic stage IIIa-B chronic kidney disease, she is presently doing well and stable serum creatinine, best option is to proceed with cardiac catheterization today to evaluate her coronary anatomy.  We will make further discussions after this.    Adrian Prows, MD, [facility] 07/12/2021, 12:04 PM Office: [phone] Fax:  [phone] Pager: [phone]

## 2021-07-12 NOTE — Progress Notes (Signed)
RT NOTE:  Pt refuses CPAP for tonight. She stated she wants to wear nasal cannula instead. Machine is @ bedside if patient changes her mind.

## 2021-07-12 NOTE — Progress Notes (Signed)
[place] for IV heparin Indication: atrial fibrillation, recent cardioversion  Allergies  Allergen Reactions   Codeine     vomiting   Metformin     Other reaction(s): nausea, vomitting and diarrhea   Oxycodone-Acetaminophen     Other reaction(s): Unknown   Percocet [Oxycodone-Acetaminophen] Nausea And Vomiting   Tape     Other reaction(s): Unknown   Tolterodine Other (See Comments)   Latex Itching and Rash    EKG leads    Patient Measurements: Height: 5' (152.4 cm) Weight: 68.5 kg (151 lb 0.2 oz) IBW/kg (Calculated) : 45.5 Heparin Dosing Weight: 68 kg  Vital Signs: Temp: 97.8 F (36.6 C) (07/03 1042) Temp Source: Oral (07/03 1042) BP: 113/52 (07/03 1042) Pulse Rate: 82 (07/03 1042)  Labs: Recent Labs    07/10/21 0351 07/11/21 0412 07/12/21 0739  HGB  --   --  10.4*  HCT  --   --  33.0*  PLT  --   --  311  APTT  --   --  94*  HEPARINUNFRC  --   --  >1.10*  CREATININE 1.39* 1.35* 1.38*     Estimated Creatinine Clearance: 25.3 mL/min (A) (by C-G formula based on SCr of 1.38 mg/dL (H)).   Medical History: Past Medical History:  Diagnosis Date   Anginal pain ([place])    occ   Anxiety    Broken ankle    Bullous pemphigus    CAD (coronary artery disease)    [place]   80% stenosis ostial Left main, 30% stenosis proximal LAD, 80% stenosis proximal RCA CABG  -  LIMA to LAD, SVG to int, SVG to [place], [place]    Depression    Diabetic peripheral neuropathy ([place])    Encounter for loop recorder check 04/11/2019   GERD (gastroesophageal reflux disease)    H/O: stroke: Left occipital stroke and vision loss 08/24/2018   Headache(784.0)    hx migraines   Heart murmur    mvp   Hyperlipidemia    Hypertension    Hypertensive heart disease    Loop recorder Reveal 08/24/2018 04/11/2019   Scheduled Remote loop recorder check 03/10/2019: The presenting rhythm is sinus bradycardia. There were 0 symptomatic patient  activations recorded. There were 275 AF episodes and 0 AT episodes recorded, representing a 4.2 % cumulative AT/AF burden. The longest episode lasted 00:03:40:00 in duration. Some false with PAC. There were 0 tachy episodes detected. There were 0 pause episodes detected. Th   Nasal bleeding    occ due to O2   OA (osteoarthritis)    Obesity (BMI 30-39.9)    Obstructive sleep apnea on CPAP    Osteopenia    Paroxysmal atrial fibrillation ([place]) 04/17/2019   Peripheral neuropathy    Seizure ([place])    2009   Sleep apnea    dx 75yr ago doesnt use cpap but uses O2 at nite 2L   Stones in the urinary tract    Type 2 diabetes mellitus with vascular disease ([place]    UTI (lower urinary tract infection)     Medications:  Infusions:   sodium chloride     sodium chloride     sodium chloride 50 mL/hr at 07/12/21 1027   heparin 1,000 Units/hr (07/12/21 0851)    Assessment: 88yo female on chronic Eliquis for afib and recent cardioversion, with plans for cath tomorrow AM.  Pharmacy asked to hold Eliquis and start IV heparin.  Last dose  of Eliquis 7/2 ~ 9 AM.  Heparin level >1.1 as expected given recent apixaban dosing. Aptt within goal at 94s on 1000 units/hr of heparin. CBC appears stable. No bleeding or IV issues noted.   Goal of Therapy:  Heparin level 0.3-0.7 units/ml Monitor platelets by anticoagulation protocol: Yes   Plan:  Continue to Hold Eliquis. Continue IV heparin at 1000 units/hr  Plan for cath today - will check confirmatory level tonight if cath is postponed Daily heparin level and CBC.  Erin Hearing PharmD., BCPS Clinical Pharmacist 07/12/2021 11:49 AM

## 2021-07-12 NOTE — Progress Notes (Signed)
PT Cancellation Note  Patient Details Name: Linda Oliver MRN: [id number] DOB: February 06, 1932   Cancelled Treatment:    Reason Eval/Treat Not Completed: Patient at procedure or test/unavailable. Pt down for heart cath.   [place] 07/12/2021, 12:22 PM [place] Office [phone]

## 2021-07-13 DIAGNOSIS — I10 Essential (primary) hypertension: Secondary | ICD-10-CM | POA: Diagnosis not present

## 2021-07-13 DIAGNOSIS — D649 Anemia, unspecified: Secondary | ICD-10-CM

## 2021-07-13 DIAGNOSIS — I5033 Acute on chronic diastolic (congestive) heart failure: Secondary | ICD-10-CM | POA: Diagnosis not present

## 2021-07-13 DIAGNOSIS — K219 Gastro-esophageal reflux disease without esophagitis: Secondary | ICD-10-CM | POA: Diagnosis not present

## 2021-07-13 DIAGNOSIS — I480 Paroxysmal atrial fibrillation: Secondary | ICD-10-CM | POA: Diagnosis not present

## 2021-07-13 LAB — BASIC METABOLIC PANEL
Anion gap: 15 (ref 5–15)
BUN: 27 mg/dL — ABNORMAL HIGH (ref 8–23)
CO2: 29 mmol/L (ref 22–32)
Calcium: 8.6 mg/dL — ABNORMAL LOW (ref 8.9–10.3)
Chloride: 92 mmol/L — ABNORMAL LOW (ref 98–111)
Creatinine, Ser: 1.42 mg/dL — ABNORMAL HIGH (ref 0.44–1.00)
GFR, Estimated: 36 mL/min — ABNORMAL LOW (ref >60)
Glucose, Bld: 137 mg/dL — ABNORMAL HIGH (ref 70–99)
Potassium: 3.7 mmol/L (ref 3.5–5.1)
Sodium: 136 mmol/L (ref 135–145)

## 2021-07-13 LAB — CBC
HCT: 31.8 % — ABNORMAL LOW (ref 36.0–46.0)
Hemoglobin: 9.9 g/dL — ABNORMAL LOW (ref 12.0–15.0)
MCH: 25.8 pg — ABNORMAL LOW (ref 26.0–34.0)
MCHC: 31.1 g/dL (ref 30.0–36.0)
MCV: 82.8 fL (ref 80.0–100.0)
Platelets: 332 10*3/uL (ref 150–400)
RBC: 3.84 MIL/uL — ABNORMAL LOW (ref 3.87–5.11)
RDW: 15 % (ref 11.5–15.5)
WBC: 9.4 10*3/uL (ref 4.0–10.5)
nRBC: 0 % (ref 0.0–0.2)

## 2021-07-13 LAB — GLUCOSE, CAPILLARY
Glucose-Capillary: 144 mg/dL — ABNORMAL HIGH (ref 70–99)
Glucose-Capillary: 148 mg/dL — ABNORMAL HIGH (ref 70–99)
Glucose-Capillary: 193 mg/dL — ABNORMAL HIGH (ref 70–99)
Glucose-Capillary: 220 mg/dL — ABNORMAL HIGH (ref 70–99)

## 2021-07-13 LAB — MAGNESIUM: Magnesium: 2 mg/dL (ref 1.7–2.4)

## 2021-07-13 MED ORDER — FUROSEMIDE 40 MG PO TABS: 40.0000 mg | ORAL_TABLET | Freq: Every day | ORAL | 0 refills | Status: AC

## 2021-07-13 MED ORDER — AMIODARONE HCL 200 MG PO TABS: 200.0000 mg | ORAL_TABLET | Freq: Two times a day (BID) | ORAL | 0 refills | Status: DC

## 2021-07-13 MED ORDER — EMPAGLIFLOZIN 10 MG PO TABS: 10.0000 mg | ORAL_TABLET | Freq: Every day | ORAL | 0 refills | Status: AC

## 2021-07-13 MED ORDER — AMOXICILLIN-POT CLAVULANATE 500-125 MG PO TABS: 1.0000 | ORAL_TABLET | Freq: Two times a day (BID) | ORAL | 0 refills | Status: AC

## 2021-07-13 NOTE — TOC Transition Note (Signed)
Transition of Care [facility]) - CM/SW Discharge Note   Patient Details  Name: Linda Oliver MRN: [id number] Date of Birth: [birth year]  [facility] CM/SW Contact:  Pollie Friar, RN Phone Number: 07/13/2021, 12:02 PM   Clinical Narrative:    Pt is discharging home today with home health through [facility]. Pt just d/c'ed from their service but willing to take her back. Information on the AVS. Pt has: cane, walker, shower seat at home. Daughter over sees her medications and provides needed transportation.  Pt qualified for home oxygen. Pt had no preference on DME company. CM has arranged through [facility]. A tank to transport home will be delivered to the room and concentrator to the home.  Pt has transport home today.   Final next level of care: Home w Home Health Services Barriers to Discharge: No Barriers Identified   Patient Goals and CMS Choice   CMS Medicare.gov Compare Post Acute Care list provided to:: Patient Choice offered to / list presented to : Patient  Discharge Placement                       Discharge Plan and Services                DME Arranged: Oxygen DME Agency: [employer] Date DME Agency Contacted: 07/13/21   Representative spoke with at DME Agency: Freda Munro HH Arranged: PT, OT, RN [facility] Agency: [place] Date [place]: 07/13/21   Representative spoke with at [place]: Marjory Lies  Social Determinants of Health ([place]) Interventions     Readmission Risk Interventions     No data to display

## 2021-07-13 NOTE — Discharge Summary (Addendum)
Physician Discharge Summary   Patient: Linda Oliver MRN: [id number] DOB: 05/11/1932  Admit date:     07/06/2021  Discharge date: 07/13/21  Discharge Physician: Tawni Millers   PCP: Shon Baton, MD   Recommendations at discharge:    Patient will be discharged home with furosemide 40 mg daily for diuresis.  Continue with amiodarone 200 mg po bid further taper as outpatient.  Added ampagliflozin for heart failure.  Continue antibiotic therapy for Pneumonia for 4 more days with Augmentin Follow up renal function as outpatient in 7 days.  Patient will go home with supplemental 02 per [place]  I spoke over the phone with the patient's daughter about patient's  condition, plan of care, prognosis and all questions were addressed.   Discharge Diagnoses: Principal Problem:   Acute on chronic diastolic CHF (congestive heart failure) (HCC) Active Problems:   Essential hypertension   Paroxysmal atrial fibrillation (HCC)   GERD (gastroesophageal reflux disease)   Lobar pneumonia (HCC)   NSTEMI (non-ST elevated myocardial infarction) (HCC)   Type 2 diabetes mellitus with hyperlipidemia (HCC)   OSA on CPAP   UTI (urinary tract infection)   Obesity (BMI 30-39.9)   Acute kidney injury superimposed on chronic kidney disease (HCC)   HFrEF (heart failure with reduced ejection fraction) ([place])  Resolved Problems:   * No resolved hospital problems. [facility] Course: Linda Oliver was admitted to the hospital with the working diagnosis of decompensated heart failure.   88 yo female with the past medical history of CAD, sp CABG, paroxysmal atrial fibrillation, CKD and T2DM who presented with dyspnea and cough. Reported 4 days of dry cough and dyspnea. Worsening symptoms. On the day of admission her 02 saturation was in the 70's and EMS was called. In the Ed she was in respiratory distress and was placed on Bipap, with improving oxygenation to 98%, blood pressure 137/54, HR 90, RR 27. Lungs with  rales and increased work of breathing, no wheezing, heart with S1 and S2 present and rhythmic with no gallops, abdomen not distended and no lower extremity edema.   Chest radiograph with bilateral hilar vascular congestion, bilateral interstitial infiltrates. Cephalization of the vasculature.   EKG 99 bpm, right axis deviation, with right bundle branch block. Qtc 514, sinus rhythm with no significant ST segment or T wave changes.   Patient has been placed on furosemide for diuresis with improvement of her symptoms.  She required intermittent Bipap for increase work of breathing.   06/29 direct current cardioversion, but patient converted back to atrial fibrillation.   Chest film with left upper lobe infiltrate, consistent with community acquired pneumonia and started on oral Augmentin for antibiotic therapy. Pneumonia present on admission.    07/03 cardiac catheterization, with severe native vessel disease with patent LIMA-LAD, SVG-ramus Compensated cardiomyopathy, possible nonischemic.   Patient will be discharge on furosemide for home diuresis and follow up as outpatient. Continue with amiodarone.   Assessment and Plan: * Acute on chronic diastolic CHF (congestive heart failure) (HCC) Echocardiogram with reduced LV systolic function with EF 35 to 40%, positive regional wall motion abnormalities. Preserved RV systolic function. No significant valvular disease.   Patient was admitted to the cardiac ward and was placed on furosemide for diuresis, negative fluid balance was achieved, - 5,127 ml, with significant improvement of her symptoms.   She will continue heart failure management with empagliflozin, isosorbide and metoprolol.  Loop diuretic with furosemide 40 mg daily.   Paroxysmal atrial fibrillation (HCC) S/P  cardioversion on 07/09/21  Continue rate control with metoprolol and  amiodarone.  Continue anticoagulation with apixaban.   Prolonged Qtc to 511 in the setting of right  bundle branch block.   Amiodarone dose will be adjusted as outpatient.   Essential hypertension Continue blood pressure control with metoprolol and isosorbide.   GERD (gastroesophageal reflux disease) - Continue PPI.  Lobar pneumonia ([place]) Follow up chest radiograph with left upper lobe infiltrate.  Chest film personally reviewed, noted improvement in pulmonary edema but persistent infiltrate on the left upper lobe.   Plan to continue with Augmentin for a total of 5 days.  Pneumonia likely present on admission.   Acute hypoxemic respiratory failure, has improved with antibiotic therapy and diuresis. At the time of her discharge she continue to require supplemental 02 per [place], home 02 will be prescribed.   NSTEMI (non-ST elevated myocardial infarction) [facility]) Patient was placed on heparin for medical therapy. Currently is chest pain free.   07/03 cardiac catheterization with PA mean 23, PCWP 12, LVEDP 13, CO 4,0 and CI 2,4.  Severe native vessel disease with patent LIMA to LAD, SVG ramus.   Plan to continue with medical therapy with clopidogrel and metoprolol.  Possible non ischemic cardiomyopathy, as culprit for decreased in LV systolic function.   Type 2 diabetes mellitus with hyperlipidemia (HCC) Hyperglycemia.  Patient was placed on insulin therapy during her hospitalization, basal and sliding scale.  Her fasting discharge glucose is 144 mg/dl.   She has been tolerating well po diet.  At the time of her discharge she will resume her home insulin regimen.  Will hold on glimperide to prevent hypoglycemia and continue with sitagliptin.   Continue with rosuvastatin.   OSA on CPAP Patient declined Cpap at night.  Use as needed as tolerated.   UTI (urinary tract infection) Completed antibiotic therapy.   Obesity (BMI 30-39.9) - Low calorie diet and portion control discussed with patient -Body mass index is 31.78 kg/m.   Acute kidney injury superimposed on chronic  kidney disease (HCC) CKD stage 3a. Hypokalemia.   Patient tolerated well diuresis with furosemide and K was corrected with Kcl.  Her discharge renal function remained stable.  Follow up renal function and electrolytes as outpatient.   Chronic anemia Anemia of chronic disease with iron deficiency. hgb has been stable with at 9.9  Continue iron supplementation and follow up as outpatient.          Consultants: cardiology  Procedures performed: cardiac catheterization   Disposition: Home Diet recommendation:  Cardiac diet DISCHARGE MEDICATION: Allergies as of 07/13/2021       Reactions   Codeine    vomiting   Metformin    Other reaction(s): nausea, vomitting and diarrhea   Oxycodone-acetaminophen    Other reaction(s): Unknown   Percocet [oxycodone-acetaminophen] Nausea And Vomiting   Tape    Other reaction(s): Unknown   Tolterodine Other (See Comments)   Latex Itching, Rash   EKG leads        Medication List     STOP taking these medications    cyclopentolate 1 % ophthalmic solution Commonly known as: Cyclogyl   glimepiride 1 MG tablet Commonly known as: AMARYL   nitrofurantoin (macrocrystal-monohydrate) 100 MG capsule Commonly known as: MACROBID       TAKE these medications    acetaminophen 500 MG tablet Commonly known as: TYLENOL Take 1,000 mg by mouth every 4 (four) hours as needed for moderate pain or headache.   amiodarone 200 MG tablet  Commonly known as: PACERONE Take 1 tablet (200 mg total) by mouth 2 (two) times daily.   amoxicillin-clavulanate 500-125 MG tablet Commonly known as: AUGMENTIN Take 1 tablet (500 mg total) by mouth 2 (two) times daily for 4 days.   apixaban 5 MG Tabs tablet Commonly known as: Eliquis Take 1 tablet (5 mg total) by mouth 2 (two) times daily. Will need to see MD for future refills.   B-D UF III MINI PEN NEEDLES 31G X 5 MM Misc Generic drug: Insulin Pen Needle SMARTSIG:1 Each SUB-Q Daily   clopidogrel 75 MG  tablet Commonly known as: PLAVIX Take 1 tablet (75 mg total) by mouth daily.   DULoxetine 60 MG capsule Commonly known as: CYMBALTA Take 60 mg by mouth daily.   empagliflozin 10 MG Tabs tablet Commonly known as: JARDIANCE Take 1 tablet (10 mg total) by mouth daily. Start taking on: July 14, 2021   ezetimibe 10 MG tablet Commonly known as: ZETIA Take 10 mg by mouth daily.   fesoterodine 8 MG Tb24 tablet Commonly known as: TOVIAZ Take 8 mg by mouth every evening.   fluticasone 50 MCG/ACT nasal spray Commonly known as: FLONASE Place 1 spray into both nostrils 2 (two) times daily as needed for allergies or rhinitis.   FREESTYLE LITE test strip Generic drug: glucose blood Check blood sugar every morning   furosemide 40 MG tablet Commonly known as: LASIX Take 1 tablet (40 mg total) by mouth daily. Start taking on: July 14, 2021 What changed:  medication strength how much to take   gabapentin 300 MG capsule Commonly known as: NEURONTIN Take 300 mg by mouth 2 (two) times daily.   Gemtesa 75 MG Tabs Generic drug: Vibegron Take 75 mg by mouth daily.   High Potency Iron 65 MG Tabs Take 65 mg by mouth daily.   hydrOXYzine 25 MG tablet Commonly known as: ATARAX Take 25-50 mg by mouth See admin instructions. 25 mg in the morning 50 mg at bedtime   isosorbide dinitrate 30 MG tablet Commonly known as: ISORDIL TAKE 1 TABLET THREE TIMES A DAY What changed: when to take this   Lantus SoloStar 100 UNIT/ML Solostar Pen Generic drug: insulin glargine Inject 25 Units into the skin daily before breakfast.   loperamide 2 MG tablet Commonly known as: IMODIUM A-D Take 2 mg by mouth 4 (four) times daily as needed for diarrhea or loose stools.   loratadine 10 MG tablet Commonly known as: CLARITIN Take 10 mg by mouth daily as needed (allergies).   metoprolol succinate 25 MG 24 hr tablet Commonly known as: TOPROL-XL Take 12.5 mg by mouth 2 (two) times daily.   Namzaric 28-10  MG Cp24 Generic drug: Memantine HCl-Donepezil HCl Take 1 capsule by mouth daily. What changed: when to take this   nitroGLYCERIN 0.4 MG SL tablet Commonly known as: NITROSTAT Place 1 tablet (0.4 mg total) under the tongue every 5 (five) minutes as needed for up to 25 days for chest pain.   NON FORMULARY CPAP at bedtime   omeprazole 20 MG capsule Commonly known as: PRILOSEC Take 20 mg by mouth every morning.   pantoprazole 40 MG tablet Commonly known as: PROTONIX Take 40 mg by mouth daily as needed (heartburn).   potassium chloride SA 20 MEQ tablet Commonly known as: KLOR-CON M TAKE 1 TABLET DAILY   rosuvastatin 10 MG tablet Commonly known as: CRESTOR Take 10 mg by mouth daily before supper.   sitaGLIPtin 100 MG tablet Commonly known as: JANUVIA  Take 100 mg by mouth daily.   vitamin B-12 1000 MCG tablet Commonly known as: CYANOCOBALAMIN Take 1,000 mcg by mouth at bedtime.               Durable Medical Equipment  (From admission, onward)           Start     Ordered   07/13/21 1126  For home use only DME oxygen  Once       Question Answer Comment  Length of Need 6 Months   Mode or (Route) Nasal cannula   Liters per Minute 2   Frequency Continuous (stationary and portable oxygen unit needed)   Oxygen conserving device Yes   Oxygen delivery system Gas      07/13/21 1125            Discharge Exam: Filed Weights   07/10/21 0432 07/11/21 0456 07/13/21 0039  Weight: 70.8 kg 68.5 kg 69.8 kg   BP (!) 128/58   Pulse 71   Temp 97.9 F (36.6 C) (Oral)   Resp 20   Ht 5' (1.524 m)   Wt 69.8 kg   SpO2 93%   BMI 30.05 kg/m   Patient is feeling better, no chest pain or dyspnea.  Neurology awake and alert ENT with mild pallor Cardiovascular with S1 and S2 present irregularly irregular, no gallops, or rubs, positive systolic murmur at the right sternal border No JVD No lower extremity edema Respiratory with scattered rales but no wheezing or  rhonchi Abdomen not distended   Condition at discharge: stable  The results of significant diagnostics from this hospitalization (including imaging, microbiology, ancillary and laboratory) are listed below for reference.   Imaging Studies: CARDIAC CATHETERIZATION  Result Date: 07/12/2021 Images from the original result were not included. LM: Ostial 95% stenosis LAD: Ostial 90% calcifific stenosis         Competitive flow from LIMA into mid LAD Lcx: No significant disease Ramus: mid 70% stenosis, bypassed by SVG-ramus RCA: Patent prox-distal RCA stents LIMA-LAD: Patent with competitive flow (Likely due to filling of native LAD through ramus system that is bypassed by SVG-ramus) SVG-ramus: Patent SVG-PDA: Not engaged today. Known poor antegrade flow. Also, native RCA patent with excellent antegrade flow RA: 5 mmHg RV: 31/1 mmHg PA: 34/15 mmHg, mPAP 23 mmHg PCW: 12 mmHg LVEDP 13 mmHg CO: 4.0 L/min CI: 2.4 L/min/m2 Severe native vessel disease with patent LIMA-LAD, SVG-ramus Compensated cardiomyopathy, possible nonischemic, well compensated Okay to discharge from cardiac standpoint on metoprolol. Will add other GDMT outpatient after renal functional recovery. Nigel Mormon, MD Pager: [phone] Office: [phone]   DG Chest 2 View  Result Date: 07/09/2021 CLINICAL DATA:  Reason for exam: fever Patient endorses sob without chest pains. Hx of angioplasty 2023, loop recorder 2020. EXAM: CHEST - 2 VIEW COMPARISON:  07/06/2021 FINDINGS: Focal airspace consolidation in the anterior left upper lobe lung slightly increased since previous. Stable mild diffuse alveolar edema or infiltrates. Heart size and mediastinal contours are within normal limits. Aortic Atherosclerosis (ICD10-170.0). Implanted recorder projects over the left lower chest. No effusion. Sternotomy wires. IMPRESSION: Worsening anterior left upper lobe airspace opacities suggesting pneumonia. Electronically Signed   By: Lucrezia Europe M.D.    On: 07/09/2021 07:56   ECHOCARDIOGRAM COMPLETE  Result Date: 07/07/2021    ECHOCARDIOGRAM REPORT   Patient Name:   Kayren Eaves Brethauer Date of Exam: 07/07/2021 Medical Rec #:  [id number]    Height:       60.0 in Accession #:  [id number]   Weight:       165.0 lb Date of Birth:  10/03/1932    BSA:          1.720 m Patient Age:    35 years     BP:           132/54 mmHg Patient Gender: F            HR:           79 bpm. Exam Location:  Inpatient Procedure: 2D Echo, Cardiac Doppler and Color Doppler Indications:     NSTEMI I21.4  History:         Patient has prior history of Echocardiogram examinations, most                  recent 12/01/2019. CAD, Arrythmias:Atrial Fibrillation,                  Signs/Symptoms:Murmur; Risk Factors:Hypertension, Diabetes,                  Dyslipidemia and Sleep Apnea.                  GERD.  Sonographer:     Darlina Sicilian RDCS Referring Phys:  [id number] Lequita Halt Diagnosing Phys: Adrian Prows MD  Sonographer Comments: Patient on BiPap IMPRESSIONS  1. Left ventricular ejection fraction, by estimation, is 35 to 40%. The left ventricle has moderately decreased function. The left ventricle demonstrates regional wall motion abnormalities (see scoring diagram/findings for description). There is mild left ventricular hypertrophy. Left ventricular diastolic parameters are indeterminate. Elevated left ventricular end-diastolic pressure. There is severe of the left ventricular, entire anterior wall. There is mild of the left ventricular, mid-apical anterolateral wall.  2. Right ventricular systolic function is normal. The right ventricular size is normal. There is normal pulmonary artery systolic pressure. The estimated right ventricular systolic pressure is 37.8 mmHg.  3. Left atrial size was mildly dilated.  4. The mitral valve is normal in structure. Mild mitral valve regurgitation.  5. The aortic valve is tricuspid. Aortic valve regurgitation is mild to moderate. Aortic valve  sclerosis/calcification is present, without any evidence of aortic stenosis.  6. The inferior vena cava is normal in size with <50% respiratory variability, suggesting right atrial pressure of 8 mmHg. Comparison(s): Changes from prior study are noted. Compared to 08/23/2018, LVEF 60-65% without regional wall motion abnormality. Mild MR and TR new. FINDINGS  Left Ventricle: Left ventricular ejection fraction, by estimation, is 35 to 40%. The left ventricle has moderately decreased function. The left ventricle demonstrates regional wall motion abnormalities. The left ventricular internal cavity size was normal in size. There is mild left ventricular hypertrophy. Left ventricular diastolic parameters are indeterminate. Elevated left ventricular end-diastolic pressure. Right Ventricle: The right ventricular size is normal. No increase in right ventricular wall thickness. Right ventricular systolic function is normal. There is normal pulmonary artery systolic pressure. The tricuspid regurgitant velocity is 2.59 m/s, and  with an assumed right atrial pressure of 8 mmHg, the estimated right ventricular systolic pressure is 58.8 mmHg. Left Atrium: Left atrial size was mildly dilated. Right Atrium: Right atrial size was normal in size. Pericardium: There is no evidence of pericardial effusion. Mitral Valve: The mitral valve is normal in structure. There is mild calcification of the mitral valve leaflet(s). Mild to moderate mitral annular calcification. Mild mitral valve regurgitation. Tricuspid Valve: The tricuspid valve is normal in structure. Tricuspid valve regurgitation is mild . No evidence  of tricuspid stenosis. Aortic Valve: The aortic valve is tricuspid. Aortic valve regurgitation is mild to moderate. Aortic regurgitation PHT measures 408 msec. Aortic valve sclerosis/calcification is present, without any evidence of aortic stenosis. Pulmonic Valve: The pulmonic valve was normal in structure. Pulmonic valve  regurgitation is trivial. No evidence of pulmonic stenosis. Aorta: The aortic root is normal in size and structure. Venous: The inferior vena cava is normal in size with less than 50% respiratory variability, suggesting right atrial pressure of 8 mmHg. IAS/Shunts: No atrial level shunt detected by color flow Doppler.  LEFT VENTRICLE PLAX 2D LVIDd:         3.70 cm     Diastology LVIDs:         2.40 cm     LV e' medial:    5.66 cm/s LV PW:         1.20 cm     LV E/e' medial:  19.7 LV IVS:        1.40 cm     LV e' lateral:   7.53 cm/s LVOT diam:     2.00 cm     LV E/e' lateral: 14.8 LV SV:         49 LV SV Index:   28 LVOT Area:     3.14 cm  LV Volumes (MOD) LV vol d, MOD A2C: 70.2 ml LV vol d, MOD A4C: 81.1 ml LV vol s, MOD A2C: 37.7 ml LV vol s, MOD A4C: 36.9 ml LV SV MOD A2C:     32.5 ml LV SV MOD A4C:     81.1 ml LV SV MOD BP:      38.1 ml RIGHT VENTRICLE TAPSE (M-mode): 0.9 cm LEFT ATRIUM             Index        RIGHT ATRIUM           Index LA diam:        3.50 cm 2.03 cm/m   RA Area:     13.30 cm LA Vol (A2C):   38.7 ml 22.50 ml/m  RA Volume:   29.10 ml  16.92 ml/m LA Vol (A4C):   32.3 ml 18.78 ml/m LA Biplane Vol: 35.4 ml 20.58 ml/m  AORTIC VALVE LVOT Vmax:   76.70 cm/s LVOT Vmean:  50.500 cm/s LVOT VTI:    0.156 m AI PHT:      408 msec  AORTA Ao Root diam: 2.70 cm MITRAL VALVE                TRICUSPID VALVE MV Area (PHT): 3.74 cm     TR Peak grad:   26.8 mmHg MV Decel Time: 203 msec     TR Vmax:        259.00 cm/s MV E velocity: 111.33 cm/s                             SHUNTS                             Systemic VTI:  0.16 m                             Systemic Diam: 2.00 cm Adrian Prows MD Electronically signed by Adrian Prows MD Signature Date/Time: 07/07/2021/9:37:57 AM    Final    DG Chest [facility]  1 View  Result Date: 07/07/2021 CLINICAL DATA:  Shortness of breath. EXAM: PORTABLE CHEST 1 VIEW COMPARISON:  10/07/2019 FINDINGS: Patient is post median sternotomy. Planted loop recorder in the left chest wall.  Mild cardiomegaly with unchanged mediastinal contours. Moderate pulmonary edema. Small pleural effusions and fluid in the fissure. No pneumothorax. IMPRESSION: CHF with moderate pulmonary edema and small pleural effusions. Electronically Signed   By: Keith Rake M.D.   On: 07/07/2021 00:10   Yag Capsulotomy - OS - Left Eye  Result Date: 06/15/2021 Time Out Confirmed correct patient, procedure, site, and patient consented. Anesthesia No anesthesia was used. Anesthesia medications included Alphagan 0.15%. Laser Information The type of laser was yag. Total spots was 35. The energy was 1.5 mj. Total energy was 50 mj. Post-op The patient tolerated the procedure well. There were no complications. The patient received written and verbal post procedure care education. Notes Intracapsular hemorrhage released into vit with YAG posterior capsulotomy  Color Fundus Photography Optos - OU - Both Eyes  Result Date: 06/15/2021 Right Eye Progression has been stable. Disc findings include normal observations. Macula : epiretinal membrane. Left Eye Progression has been stable. Disc findings include normal observations. Macula : normal observations. Vessels : normal observations. Periphery : normal observations. Notes OD with posterior vitreous detachment, clear media and minor epiretinal membrane without topographic distortion OS, history of macular hole, dispersed vitreous hemorrhage left eye retina attached  OCT, Retina - OU - Both Eyes  Result Date: 06/15/2021 Right Eye Quality was good. Scan locations included subfoveal. Central Foveal Thickness: 284. Progression has been stable. Findings include abnormal foveal contour, epiretinal membrane. Left Eye Quality was good. Scan locations included subfoveal. Central Foveal Thickness: 270. Findings include abnormal foveal contour. Notes OD minor epiretinal membrane no impact on acuity observe OS classic findings of post macular hole and its repair subtle atrophy temporally,  media opacity   Microbiology: Results for orders placed or performed during the hospital encounter of 07/06/21  Resp Panel by RT-PCR (Flu A&B, Covid) Anterior Nasal Swab     Status: None   Collection Time: 07/06/21 11:44 PM   Specimen: Anterior Nasal Swab  Result Value Ref Range Status   SARS Coronavirus 2 by RT PCR NEGATIVE NEGATIVE Final    Comment: (NOTE) SARS-CoV-2 target nucleic acids are NOT DETECTED.  The SARS-CoV-2 RNA is generally detectable in upper respiratory specimens during the acute phase of infection. The lowest concentration of SARS-CoV-2 viral copies this assay can detect is 138 copies/mL. A negative result does not preclude SARS-Cov-2 infection and should not be used as the sole basis for treatment or other patient management decisions. A negative result may occur with  improper specimen collection/handling, submission of specimen other than nasopharyngeal swab, presence of viral mutation(s) within the areas targeted by this assay, and inadequate number of viral copies(<138 copies/mL). A negative result must be combined with clinical observations, patient history, and epidemiological information. The expected result is Negative.  Fact Sheet for Patients:  EntrepreneurPulse.com.au  Fact Sheet for Healthcare Providers:  IncredibleEmployment.be  This test is no t yet approved or cleared by the [place] FDA and  has been authorized for detection and/or diagnosis of SARS-CoV-2 by FDA under an Emergency Use Authorization (EUA). This EUA will remain  in effect (meaning this test can be used) for the duration of the COVID-19 declaration under Section 564(b)(1) of the Act, 21 U.S.C.section 360bbb-3(b)(1), unless the authorization is terminated  or revoked sooner.       Influenza  A by PCR NEGATIVE NEGATIVE Final   Influenza B by PCR NEGATIVE NEGATIVE Final    Comment: (NOTE) The Xpert Xpress SARS-CoV-2/FLU/RSV plus assay is  intended as an aid in the diagnosis of influenza from Nasopharyngeal swab specimens and should not be used as a sole basis for treatment. Nasal washings and aspirates are unacceptable for Xpert Xpress SARS-CoV-2/FLU/RSV testing.  Fact Sheet for Patients: EntrepreneurPulse.com.au  Fact Sheet for Healthcare Providers: IncredibleEmployment.be  This test is not yet approved or cleared by the [place] FDA and has been authorized for detection and/or diagnosis of SARS-CoV-2 by FDA under an Emergency Use Authorization (EUA). This EUA will remain in effect (meaning this test can be used) for the duration of the COVID-19 declaration under Section 564(b)(1) of the Act, 21 U.S.C. section 360bbb-3(b)(1), unless the authorization is terminated or revoked.  Performed at [facility], [place] [address], [place], [place] [address]   Culture, blood (Routine X 2) w Reflex to ID Panel     Status: None (Preliminary result)   Collection Time: 07/09/21  6:40 AM   Specimen: BLOOD RIGHT HAND  Result Value Ref Range Status   Specimen Description BLOOD RIGHT HAND  Final   Special Requests   Final    BOTTLES DRAWN AEROBIC ONLY Blood Culture results may not be optimal due to an inadequate volume of blood received in culture bottles   Culture   Final    NO GROWTH 4 DAYS Performed at [facility], [place] [address], [place], [place] [address]    Report Status PENDING  Incomplete  Culture, blood (Routine X 2) w Reflex to ID Panel     Status: None (Preliminary result)   Collection Time: 07/09/21  7:05 AM   Specimen: BLOOD RIGHT HAND  Result Value Ref Range Status   Specimen Description BLOOD RIGHT HAND  Final   Special Requests   Final    BOTTLES DRAWN AEROBIC ONLY Blood Culture results may not be optimal due to an inadequate volume of blood received in culture bottles   Culture   Final    NO GROWTH 4 DAYS Performed at [facility], [place] [address] [place], [place] [address]    Report Status PENDING  Incomplete    Labs: CBC: Recent Labs  Lab 07/06/21 2330 07/08/21 0628 07/09/21 0705 07/12/21 0739 07/12/21 1251 07/12/21 1259 07/13/21 0149  WBC 19.3* 9.8 15.2* 8.7  --   --  9.4  HGB 11.3* 10.1* 10.2* 10.4* 11.2*  10.9* 10.9* 9.9*  HCT 36.6 31.6* 32.8* 33.0* 33.0*  32.0* 32.0* 31.8*  MCV 86.5 82.9 83.9 82.5  --   --  82.8  PLT 298 253 279 311  --   --  683   Basic Metabolic Panel: Recent Labs  Lab 07/07/21 0525 07/08/21 0628 07/08/21 1106 07/09/21 0705 07/10/21 0351 07/11/21 0412 07/12/21 0739 07/12/21 1251 07/12/21 1259 07/13/21 0149  NA  --    < > 138 136 134* 137 134* 136  135 137  --   K  --    < > 3.5 4.4 3.8 3.3* 3.6 3.4*  3.4* 3.3*  --   CL  --    < > 95* 96* 93* 90* 94*  --   --   --   CO2  --    < > '31 29 30 '$ 32 32  --   --   --   GLUCOSE  --    < > 225* 179*  137* 165* 141*  --   --   --   BUN  --    < > '21 23 21 22 '$ 26*  --   --   --   CREATININE  --    < > 1.35* 1.43* 1.39* 1.35* 1.38*  --   --   --   CALCIUM  --    < > 8.5* 8.5* 8.3* 8.7* 8.5*  --   --   --   MG 1.8  --   --  1.8  --   --   --   --   --  2.0   < > = values in this interval not displayed.   Liver Function Tests: Recent Labs  Lab 07/06/21 2330 07/08/21 1547  AST 44* 34  ALT 24 22  ALKPHOS 134* 115  BILITOT 1.4* 1.4*  PROT 6.9 6.6  ALBUMIN 3.1* 2.7*   CBG: Recent Labs  Lab 07/12/21 1617 07/12/21 2109 07/13/21 0052 07/13/21 0627 07/13/21 0900  GLUCAP 156* 204* 148* 144* 193*    Discharge time spent: greater than 30 minutes.  Signed: Tawni Millers, MD [facility] 07/13/2021

## 2021-07-13 NOTE — Assessment & Plan Note (Signed)
Anemia of chronic disease with iron deficiency. hgb has been stable with at 9.9  Continue iron supplementation and follow up as outpatient.

## 2021-07-13 NOTE — Progress Notes (Signed)
Physical Therapy Treatment Patient Details Name: Linda Oliver MRN: [id number] DOB: August 01, 1932 Today's Date: 07/13/2021   History of Present Illness 88 y.o. female presents to [facility] hospital on 07/06/2021 with dyspnea, cough and fatigue. Pt hypoxic in ED requiring BiPAP, chet x-ray with pulmonary edema, troponins elevated. Pt admitted for management of NSTEMI, CHF. PMH includes anxiety, CAD, depression, HTN, PAF, seizure, DMII.    PT Comments    Pt received in bed and reports that she is hopeful to go home but is still feeling very weak. On RA at rest pt's O2 is 91%. Pt first needed mod A for sit>stand and pivoting to [facility]. After first time she was able to transfer with min A. Pt ambulated 50' on RA with SPO2 dropping to 82% and with LOB to R with RW with min A to correct. Pt then ambulated on 2L O2 and was more steady with RW with sats remaining in 90's. Recommend home O2 as well as HHPT for balance program. Instructed pt that she needs to have someone with her when she is ambulating when she first gets home. Her reaction time is not quick enough to catch herself if she does lose her balance. PT will continue to follow.    Recommendations for follow up therapy are one component of a multi-disciplinary discharge planning process, led by the attending physician.  Recommendations may be updated based on patient status, additional functional criteria and insurance authorization.  Follow Up Recommendations  Home health PT     Assistance Recommended at Discharge Frequent or constant Supervision/Assistance  Patient can return home with the following A little help with walking and/or transfers;A little help with bathing/dressing/bathroom;Assistance with cooking/housework;Assist for transportation;Help with stairs or ramp for entrance   Equipment Recommendations  Other (comment) (supplemental O2)    Recommendations for Other Services       Precautions / Restrictions Precautions Precautions:  Fall Precaution Comments: monitor HR, SPO2 Restrictions Weight Bearing Restrictions: No     Mobility  Bed Mobility Overal bed mobility: Needs Assistance Bed Mobility: Supine to Sit     Supine to sit: HOB elevated, Supervision     General bed mobility comments: pt able to come to EOB without physical assist    Transfers Overall transfer level: Needs assistance Equipment used: Rolling walker (2 wheels) Transfers: Sit to/from Stand, Bed to chair/wheelchair/BSC Sit to Stand: Min assist, Mod assist Stand pivot transfers: Mod assist         General transfer comment: with first sit>stand, pt unable to get self up and needed mod  A for power up as well as mod A to step feet to [facility]. After this, pt was able to transfer with only min A.    Ambulation/Gait Ambulation/Gait assistance: Min assist Gait Distance (Feet): 110 Feet (50', 60') Assistive device: Rolling walker (2 wheels) Gait Pattern/deviations: Step-through pattern, Staggering right Gait velocity: reduced Gait velocity interpretation: <1.8 ft/sec, indicate of risk for recurrent falls   General Gait Details: pt with LOB to R with RW with min A to correct and seeming decreased awareness by pt. On RA, pt's O2 dropped to 82% and she was more unsteady. On 2L, SPO2 remained in 90's and pt more stable with use of RW.   Stairs             Wheelchair Mobility    Modified Rankin (Stroke Patients Only)       Balance Overall balance assessment: Needs assistance Sitting-balance support: No upper extremity supported, Feet supported Sitting  balance-Leahy Scale: Good     Standing balance support: No upper extremity supported, During functional activity, Bilateral upper extremity supported Standing balance-Leahy Scale: Poor Standing balance comment: min A needed in standing while pt used both hands for peri care after BSC use                            Cognition Arousal/Alertness: Awake/alert Behavior  During Therapy: WFL for tasks assessed/performed Overall Cognitive Status: Impaired/Different from baseline Area of Impairment: Problem solving, Safety/judgement                         Safety/Judgement: Decreased awareness of safety   Problem Solving: Slow processing General Comments: pt aware that she feels weak but less aware of her balance deficits though she does state that she knows her family needs to be with her when she's walking when she first goes home        Exercises      General Comments General comments (skin integrity, edema, etc.): HR 79 bpm with mobility      Pertinent Vitals/Pain Pain Assessment Pain Assessment: No/denies pain    Home Living                          Prior Function            PT Goals (current goals can now be found in the care plan section) Acute Rehab PT Goals Patient Stated Goal: to go home PT Goal Formulation: With patient Time For Goal Achievement: 07/22/21 Potential to Achieve Goals: Good Progress towards PT goals: Progressing toward goals    Frequency    Min 3X/week      PT Plan Discharge plan needs to be updated    Co-evaluation              AM-PAC PT "6 Clicks" Mobility   Outcome Measure  Help needed turning from your back to your side while in a flat bed without using bedrails?: None Help needed moving from lying on your back to sitting on the side of a flat bed without using bedrails?: None Help needed moving to and from a bed to a chair (including a wheelchair)?: A Little Help needed standing up from a chair using your arms (e.g., wheelchair or bedside chair)?: A Little Help needed to walk in hospital room?: A Little Help needed climbing 3-5 steps with a railing? : Total 6 Click Score: 18    End of Session Equipment Utilized During Treatment: Gait belt;Oxygen Activity Tolerance: Patient tolerated treatment well Patient left: in chair;with call bell/phone within reach;with chair  alarm set Nurse Communication: Mobility status PT Visit Diagnosis: Other abnormalities of gait and mobility (R26.89);Muscle weakness (generalized) (M62.81)     Time: 7902-4097 PT Time Calculation (min) (ACUTE ONLY): 35 min  Charges:  $Gait Training: 23-37 mins                     [place] chat preferred Office [place] 07/13/2021, 1:56 PM

## 2021-07-13 NOTE — Progress Notes (Signed)
PT Note:   SATURATION QUALIFICATIONS: (This note is used to comply with regulatory documentation for home oxygen)  Patient Saturations on Room Air at Rest = 91%  Patient Saturations on Room Air while Ambulating = 82%  Patient Saturations on 2 Liters of oxygen while Ambulating = 95%  Please briefly explain why patient needs home oxygen: Pt O2 drops into low 80's with mobility on RA and pt becomes more unsteady with gait. On 2 L O2 SPO2 maintained in 90's and pt more steady and safer.   Leighton Roach, PT  Acute Rehab Services Secure chat preferred Office [phone]

## 2021-07-14 ENCOUNTER — Encounter (HOSPITAL_COMMUNITY): Payer: Self-pay | Admitting: Cardiology

## 2021-07-14 LAB — CULTURE, BLOOD (ROUTINE X 2)
Culture: NO GROWTH
Culture: NO GROWTH

## 2021-07-14 LAB — LIPOPROTEIN A (LPA): Lipoprotein (a): 205.8 nmol/L — ABNORMAL HIGH (ref <75.0)

## 2021-07-15 ENCOUNTER — Ambulatory Visit: Payer: Medicare Other | Admitting: Cardiology

## 2021-07-15 ENCOUNTER — Telehealth: Payer: Self-pay

## 2021-07-15 ENCOUNTER — Encounter (INDEPENDENT_AMBULATORY_CARE_PROVIDER_SITE_OTHER): Payer: Medicare Other | Admitting: Ophthalmology

## 2021-07-15 NOTE — Telephone Encounter (Signed)
TOC call:  Called patient her hospital visit. NA, LMAM

## 2021-07-16 DIAGNOSIS — E1122 Type 2 diabetes mellitus with diabetic chronic kidney disease: Secondary | ICD-10-CM | POA: Diagnosis not present

## 2021-07-16 DIAGNOSIS — J9601 Acute respiratory failure with hypoxia: Secondary | ICD-10-CM | POA: Diagnosis not present

## 2021-07-16 DIAGNOSIS — F32A Depression, unspecified: Secondary | ICD-10-CM | POA: Diagnosis not present

## 2021-07-16 DIAGNOSIS — I130 Hypertensive heart and chronic kidney disease with heart failure and stage 1 through stage 4 chronic kidney disease, or unspecified chronic kidney disease: Secondary | ICD-10-CM | POA: Diagnosis not present

## 2021-07-16 DIAGNOSIS — N179 Acute kidney failure, unspecified: Secondary | ICD-10-CM | POA: Diagnosis not present

## 2021-07-16 DIAGNOSIS — N189 Chronic kidney disease, unspecified: Secondary | ICD-10-CM | POA: Diagnosis not present

## 2021-07-16 DIAGNOSIS — F419 Anxiety disorder, unspecified: Secondary | ICD-10-CM | POA: Diagnosis not present

## 2021-07-16 DIAGNOSIS — I5020 Unspecified systolic (congestive) heart failure: Secondary | ICD-10-CM | POA: Diagnosis not present

## 2021-07-16 DIAGNOSIS — G4733 Obstructive sleep apnea (adult) (pediatric): Secondary | ICD-10-CM | POA: Diagnosis not present

## 2021-07-16 DIAGNOSIS — D72829 Elevated white blood cell count, unspecified: Secondary | ICD-10-CM | POA: Diagnosis not present

## 2021-07-16 DIAGNOSIS — N183 Chronic kidney disease, stage 3 unspecified: Secondary | ICD-10-CM | POA: Diagnosis not present

## 2021-07-16 DIAGNOSIS — E669 Obesity, unspecified: Secondary | ICD-10-CM | POA: Diagnosis not present

## 2021-07-16 DIAGNOSIS — K219 Gastro-esophageal reflux disease without esophagitis: Secondary | ICD-10-CM | POA: Diagnosis not present

## 2021-07-16 DIAGNOSIS — D631 Anemia in chronic kidney disease: Secondary | ICD-10-CM | POA: Diagnosis not present

## 2021-07-16 DIAGNOSIS — I2510 Atherosclerotic heart disease of native coronary artery without angina pectoris: Secondary | ICD-10-CM | POA: Diagnosis not present

## 2021-07-16 DIAGNOSIS — I214 Non-ST elevation (NSTEMI) myocardial infarction: Secondary | ICD-10-CM | POA: Diagnosis not present

## 2021-07-16 DIAGNOSIS — E785 Hyperlipidemia, unspecified: Secondary | ICD-10-CM | POA: Diagnosis not present

## 2021-07-16 DIAGNOSIS — E1142 Type 2 diabetes mellitus with diabetic polyneuropathy: Secondary | ICD-10-CM | POA: Diagnosis not present

## 2021-07-16 DIAGNOSIS — H4312 Vitreous hemorrhage, left eye: Secondary | ICD-10-CM | POA: Diagnosis not present

## 2021-07-16 DIAGNOSIS — I4581 Long QT syndrome: Secondary | ICD-10-CM | POA: Diagnosis not present

## 2021-07-16 DIAGNOSIS — N390 Urinary tract infection, site not specified: Secondary | ICD-10-CM | POA: Diagnosis not present

## 2021-07-16 DIAGNOSIS — J181 Lobar pneumonia, unspecified organism: Secondary | ICD-10-CM | POA: Diagnosis not present

## 2021-07-16 DIAGNOSIS — L120 Bullous pemphigoid: Secondary | ICD-10-CM | POA: Diagnosis not present

## 2021-07-16 DIAGNOSIS — E1169 Type 2 diabetes mellitus with other specified complication: Secondary | ICD-10-CM | POA: Diagnosis not present

## 2021-07-16 DIAGNOSIS — I480 Paroxysmal atrial fibrillation: Secondary | ICD-10-CM | POA: Diagnosis not present

## 2021-07-16 DIAGNOSIS — I5033 Acute on chronic diastolic (congestive) heart failure: Secondary | ICD-10-CM | POA: Diagnosis not present

## 2021-07-16 NOTE — Telephone Encounter (Signed)
Location of hospitalization: [place] Reason for hospitalization: SOB Date of discharge: 07/13/2021 Date of first communication with patient: today Person contacting patient: Me Current symptoms: Difficulty breathing  Do you understand why you were in the Hospital: Yes Questions regarding discharge instructions: None Where were you discharged to: Home Medications reviewed: Yes Allergies reviewed: Yes Dietary changes reviewed: Yes. Discussed low fat and low salt diet.  Referals reviewed: NA Activities of Daily Living: Able to with mild limitations Any transportation issues/concerns: None Any patient concerns: None Confirmed importance & date/time of Follow up appt: Yes Confirmed with patient if condition begins to worsen call. Pt was given the office number and encouraged to call back with questions or concerns: Yes

## 2021-07-17 DIAGNOSIS — R059 Cough, unspecified: Secondary | ICD-10-CM | POA: Diagnosis not present

## 2021-07-19 DIAGNOSIS — N183 Chronic kidney disease, stage 3 unspecified: Secondary | ICD-10-CM | POA: Diagnosis not present

## 2021-07-19 DIAGNOSIS — D631 Anemia in chronic kidney disease: Secondary | ICD-10-CM | POA: Diagnosis not present

## 2021-07-19 DIAGNOSIS — I5020 Unspecified systolic (congestive) heart failure: Secondary | ICD-10-CM | POA: Diagnosis not present

## 2021-07-19 DIAGNOSIS — I130 Hypertensive heart and chronic kidney disease with heart failure and stage 1 through stage 4 chronic kidney disease, or unspecified chronic kidney disease: Secondary | ICD-10-CM | POA: Diagnosis not present

## 2021-07-19 DIAGNOSIS — E1122 Type 2 diabetes mellitus with diabetic chronic kidney disease: Secondary | ICD-10-CM | POA: Diagnosis not present

## 2021-07-19 DIAGNOSIS — I5033 Acute on chronic diastolic (congestive) heart failure: Secondary | ICD-10-CM | POA: Diagnosis not present

## 2021-07-20 DIAGNOSIS — E1149 Type 2 diabetes mellitus with other diabetic neurological complication: Secondary | ICD-10-CM | POA: Diagnosis not present

## 2021-07-20 DIAGNOSIS — N1831 Chronic kidney disease, stage 3a: Secondary | ICD-10-CM | POA: Diagnosis not present

## 2021-07-20 DIAGNOSIS — I5033 Acute on chronic diastolic (congestive) heart failure: Secondary | ICD-10-CM | POA: Diagnosis not present

## 2021-07-20 DIAGNOSIS — Z7901 Long term (current) use of anticoagulants: Secondary | ICD-10-CM | POA: Diagnosis not present

## 2021-07-20 DIAGNOSIS — I214 Non-ST elevation (NSTEMI) myocardial infarction: Secondary | ICD-10-CM | POA: Diagnosis not present

## 2021-07-20 DIAGNOSIS — E785 Hyperlipidemia, unspecified: Secondary | ICD-10-CM | POA: Diagnosis not present

## 2021-07-20 DIAGNOSIS — R4189 Other symptoms and signs involving cognitive functions and awareness: Secondary | ICD-10-CM | POA: Diagnosis not present

## 2021-07-20 DIAGNOSIS — I2510 Atherosclerotic heart disease of native coronary artery without angina pectoris: Secondary | ICD-10-CM | POA: Diagnosis not present

## 2021-07-20 DIAGNOSIS — I4891 Unspecified atrial fibrillation: Secondary | ICD-10-CM | POA: Diagnosis not present

## 2021-07-20 DIAGNOSIS — I119 Hypertensive heart disease without heart failure: Secondary | ICD-10-CM | POA: Diagnosis not present

## 2021-07-20 DIAGNOSIS — J189 Pneumonia, unspecified organism: Secondary | ICD-10-CM | POA: Diagnosis not present

## 2021-07-20 DIAGNOSIS — B379 Candidiasis, unspecified: Secondary | ICD-10-CM | POA: Diagnosis not present

## 2021-07-21 DIAGNOSIS — I130 Hypertensive heart and chronic kidney disease with heart failure and stage 1 through stage 4 chronic kidney disease, or unspecified chronic kidney disease: Secondary | ICD-10-CM | POA: Diagnosis not present

## 2021-07-21 DIAGNOSIS — N183 Chronic kidney disease, stage 3 unspecified: Secondary | ICD-10-CM | POA: Diagnosis not present

## 2021-07-21 DIAGNOSIS — D631 Anemia in chronic kidney disease: Secondary | ICD-10-CM | POA: Diagnosis not present

## 2021-07-21 DIAGNOSIS — E1122 Type 2 diabetes mellitus with diabetic chronic kidney disease: Secondary | ICD-10-CM | POA: Diagnosis not present

## 2021-07-21 DIAGNOSIS — I5020 Unspecified systolic (congestive) heart failure: Secondary | ICD-10-CM | POA: Diagnosis not present

## 2021-07-21 DIAGNOSIS — I5033 Acute on chronic diastolic (congestive) heart failure: Secondary | ICD-10-CM | POA: Diagnosis not present

## 2021-07-23 DIAGNOSIS — I5020 Unspecified systolic (congestive) heart failure: Secondary | ICD-10-CM | POA: Diagnosis not present

## 2021-07-23 DIAGNOSIS — D631 Anemia in chronic kidney disease: Secondary | ICD-10-CM | POA: Diagnosis not present

## 2021-07-23 DIAGNOSIS — I5033 Acute on chronic diastolic (congestive) heart failure: Secondary | ICD-10-CM | POA: Diagnosis not present

## 2021-07-23 DIAGNOSIS — N390 Urinary tract infection, site not specified: Secondary | ICD-10-CM | POA: Diagnosis not present

## 2021-07-23 DIAGNOSIS — N183 Chronic kidney disease, stage 3 unspecified: Secondary | ICD-10-CM | POA: Diagnosis not present

## 2021-07-23 DIAGNOSIS — E1122 Type 2 diabetes mellitus with diabetic chronic kidney disease: Secondary | ICD-10-CM | POA: Diagnosis not present

## 2021-07-23 DIAGNOSIS — I130 Hypertensive heart and chronic kidney disease with heart failure and stage 1 through stage 4 chronic kidney disease, or unspecified chronic kidney disease: Secondary | ICD-10-CM | POA: Diagnosis not present

## 2021-07-26 DIAGNOSIS — I5020 Unspecified systolic (congestive) heart failure: Secondary | ICD-10-CM | POA: Diagnosis not present

## 2021-07-26 DIAGNOSIS — N183 Chronic kidney disease, stage 3 unspecified: Secondary | ICD-10-CM | POA: Diagnosis not present

## 2021-07-26 DIAGNOSIS — E1122 Type 2 diabetes mellitus with diabetic chronic kidney disease: Secondary | ICD-10-CM | POA: Diagnosis not present

## 2021-07-26 DIAGNOSIS — D631 Anemia in chronic kidney disease: Secondary | ICD-10-CM | POA: Diagnosis not present

## 2021-07-26 DIAGNOSIS — I5033 Acute on chronic diastolic (congestive) heart failure: Secondary | ICD-10-CM | POA: Diagnosis not present

## 2021-07-26 DIAGNOSIS — I130 Hypertensive heart and chronic kidney disease with heart failure and stage 1 through stage 4 chronic kidney disease, or unspecified chronic kidney disease: Secondary | ICD-10-CM | POA: Diagnosis not present

## 2021-07-27 DIAGNOSIS — E1122 Type 2 diabetes mellitus with diabetic chronic kidney disease: Secondary | ICD-10-CM | POA: Diagnosis not present

## 2021-07-27 DIAGNOSIS — I130 Hypertensive heart and chronic kidney disease with heart failure and stage 1 through stage 4 chronic kidney disease, or unspecified chronic kidney disease: Secondary | ICD-10-CM | POA: Diagnosis not present

## 2021-07-27 DIAGNOSIS — D631 Anemia in chronic kidney disease: Secondary | ICD-10-CM | POA: Diagnosis not present

## 2021-07-27 DIAGNOSIS — I5033 Acute on chronic diastolic (congestive) heart failure: Secondary | ICD-10-CM | POA: Diagnosis not present

## 2021-07-27 DIAGNOSIS — I5020 Unspecified systolic (congestive) heart failure: Secondary | ICD-10-CM | POA: Diagnosis not present

## 2021-07-27 DIAGNOSIS — N183 Chronic kidney disease, stage 3 unspecified: Secondary | ICD-10-CM | POA: Diagnosis not present

## 2021-07-28 ENCOUNTER — Ambulatory Visit: Payer: Medicare Other | Admitting: Student

## 2021-07-28 ENCOUNTER — Encounter: Payer: Self-pay | Admitting: Student

## 2021-07-28 VITALS — BP 106/62 | HR 50 | Resp 16 | Ht 60.0 in | Wt 157.8 lb

## 2021-07-28 DIAGNOSIS — I5032 Chronic diastolic (congestive) heart failure: Secondary | ICD-10-CM | POA: Diagnosis not present

## 2021-07-28 DIAGNOSIS — I480 Paroxysmal atrial fibrillation: Secondary | ICD-10-CM

## 2021-07-28 DIAGNOSIS — I2510 Atherosclerotic heart disease of native coronary artery without angina pectoris: Secondary | ICD-10-CM

## 2021-07-28 MED ORDER — AMIODARONE HCL 200 MG PO TABS: 200.0000 mg | ORAL_TABLET | Freq: Every day | ORAL | 0 refills | Status: AC

## 2021-07-28 NOTE — Progress Notes (Signed)
Primary Physician/Referring:  Creola Corn, MD  Patient ID: Linda Oliver, female    DOB: 1932-07-03, 88 y.o.   MRN: [id number]  Chief Complaint  Patient presents with   NSTEMI    Congestive Heart Failure   Follow-up   HPI:    Linda Oliver  is a 88 y.o. Caucasian female patient with CAD s/p CABG x 3 in 1996 in [place], HTN, hyperlipidemia, diabetes with PVD and stage 3a CKD and sleep apnea on CPAP and now compliant, carotid stenosis, and history of  autonomic orthostatic hypotension, CVA with medial left occipital infarct in embolic pattern by MRI in 2020 and loop recorder implanted showed paroxysmal atrial fibrillation.    Admitted with unstable angina and underwent coronary angiography on 03/05/2021 and overlapping stent plantation to the right coronary artery native as the SVG to RCA was functionally occluded.  Left grafts are widely patent.  Patient was again admitted 07/07/2021 - 07/13/2021 with acute heart failure.  She underwent cardiac cath which revealed patent stents, no PCI.  Patient was diuresed approximately 5 L and discharged on amiodarone 4 rate and rhythm control of atrial fibrillation as well as Jardiance.  Patient states dyspnea has significantly improved since discharge, however she is now on 2 L/min via nasal cannula at baseline.  She also notes bilateral hand tremors worsening over the last 1 week.  Notably patient was recently seen in Dr. Ferd Hibbs office and had repeat blood work done, will work to obtain these records.  Past Medical History:  Diagnosis Date   Anginal pain (HCC)    occ   Anxiety    Broken ankle    Bullous pemphigus    CAD (coronary artery disease)    Cath 1996  [place]   80% stenosis ostial Left main, 30% stenosis proximal LAD, 80% stenosis proximal RCA CABG  -  LIMA to LAD, SVG to int, SVG to RCA 1996 [place], [place]    Depression    Diabetic peripheral neuropathy (HCC)    Encounter for loop recorder check 04/11/2019   GERD (gastroesophageal reflux  disease)    H/O: stroke: Left occipital stroke and vision loss 08/24/2018   Headache(784.0)    hx migraines   Heart murmur    mvp   Hyperlipidemia    Hypertension    Hypertensive heart disease    Loop recorder Reveal 08/24/2018 04/11/2019   Scheduled Remote loop recorder check 03/10/2019: The presenting rhythm is sinus bradycardia. There were 0 symptomatic patient activations recorded. There were 275 AF episodes and 0 AT episodes recorded, representing a 4.2 % cumulative AT/AF burden. The longest episode lasted 00:03:40:00 in duration. Some false with PAC. There were 0 tachy episodes detected. There were 0 pause episodes detected. Th   Nasal bleeding    occ due to O2   OA (osteoarthritis)    Obesity (BMI 30-39.9)    Obstructive sleep apnea on CPAP    Osteopenia    Paroxysmal atrial fibrillation (HCC) 04/17/2019   Peripheral neuropathy    Seizure (HCC)    2009   Sleep apnea    dx 23yrs ago doesnt use cpap but uses O2 at nite 2L   Stones in the urinary tract    Type 2 diabetes mellitus with vascular disease (HCC)    UTI (lower urinary tract infection)    ROS  Review of Systems  Cardiovascular:  Negative for chest pain, dyspnea on exertion (improved) and leg swelling.  Respiratory:  Positive for snoring (on CPAP).  Objective  Blood pressure 106/62, pulse (!) 50, resp. rate 16, height 5' (1.524 m), weight 157 lb 12.8 oz (71.6 kg), SpO2 99 %.     07/28/2021    1:35 PM 07/28/2021    1:29 PM 07/13/2021   12:18 PM  Vitals with BMI  Height  $Remov'5\' 0"'pZijpb$    Weight  157 lbs 13 oz   BMI  48.18   Systolic 563 93 149  Diastolic 62 26 59  Pulse 50 50 79     Physical Exam Vitals reviewed.  Constitutional:      Appearance: She is well-developed.  Neck:     Vascular: Carotid bruit (bilateral) present. No JVD.  Cardiovascular:     Rate and Rhythm: Normal rate and regular rhythm.     Pulses: Intact distal pulses.          Radial pulses are 2+ on the right side and 2+ on the left side.        Posterior tibial pulses are 2+ on the right side and 2+ on the left side.     Heart sounds: Murmur heard.     Systolic murmur is present at the upper right sternal border.     No gallop.  Pulmonary:     Effort: Pulmonary effort is normal. No accessory muscle usage or respiratory distress.     Breath sounds: Normal breath sounds. No wheezing, rhonchi or rales.  Chest:     Chest wall: Tenderness (location of loop recorder) present.  Musculoskeletal:     Right shoulder: Normal.     Right lower leg: Edema (minimal) present.     Left lower leg: Edema (minimal) present.    Laboratory examination:      Latest Ref Rng & Units 07/13/2021    1:49 AM 07/12/2021   12:59 PM 07/12/2021   12:51 PM  CMP  Glucose 70 - 99 mg/dL 137     BUN 8 - 23 mg/dL 27     Creatinine 0.44 - 1.00 mg/dL 1.42     Sodium 135 - 145 mmol/L 136  137  135    136   Potassium 3.5 - 5.1 mmol/L 3.7  3.3  3.4    3.4   Chloride 98 - 111 mmol/L 92     CO2 22 - 32 mmol/L 29     Calcium 8.9 - 10.3 mg/dL 8.6         Latest Ref Rng & Units 07/13/2021    1:49 AM 07/12/2021   12:59 PM 07/12/2021   12:51 PM  CBC  WBC 4.0 - 10.5 K/uL 9.4     Hemoglobin 12.0 - 15.0 g/dL 9.9  10.9  10.9    11.2   Hematocrit 36.0 - 46.0 % 31.8  32.0  32.0    33.0   Platelets 150 - 400 K/uL 332      Lipid Panel     Component Value Date/Time   CHOL 81 07/09/2021 0705   TRIG 76 07/09/2021 0705   HDL 31 (L) 07/09/2021 0705   CHOLHDL 2.6 07/09/2021 0705   VLDL 15 07/09/2021 0705   LDLCALC 35 07/09/2021 0705   HEMOGLOBIN A1C Lab Results  Component Value Date   HGBA1C 7.8 (H) 07/07/2021   MPG 177.16 07/07/2021   TSH Recent Labs    07/08/21 1547  TSH 1.097    External labs:  Troponin trend 03/03/21 --> 03/05/2021:  97 --> 220 -->  241 -->  193  03/06/2021: Sodium 138, potassium 4.4,  BUN 16, creatinine 0.93, GFR 57 Hgb 11.9, HCT 37, platelet 212, MCV 84 Magnesium 1.9  03/04/2021: Total cholesterol 99, triglycerides 86, HDL 30, LDL  44 TSH 1.89 BNP 722 A1c 7.6%  01/14/2021:  GLUCOSE 102   60 - 110 BUN  13   5 - 23 CREATININE 0.9   0.3 - 1.5 eGFR Non-African American 59.4    SODIUM 142   135 - 148 POTASSIUM 4.4   3.5 - 5.3  CHOLESTEROL  108   100 - 200 TRIGLYCERIDES 80   30 - 149 HDL   36   40 - 60 LDL   56   < CHOL/HDL RATIO 3.0   <  TSH    2.29   HGB 11.5   12.0 - 18.0 HCT 36.3   37.0 - 51.0 MCV 88.1   80.0 - 97.0 MCH 28.0   26.0 - 32.0 MCHC 31.8   31.0 - 36.0 RDW 14.8   12.3 - 15.4 PLT 240   140 - 440  Allergies   Allergies  Allergen Reactions   Codeine     vomiting   Metformin     Other reaction(s): nausea, vomitting and diarrhea   Oxycodone-Acetaminophen     Other reaction(s): Unknown   Percocet [Oxycodone-Acetaminophen] Nausea And Vomiting   Tape     Other reaction(s): Unknown   Tolterodine Other (See Comments)   Latex Itching and Rash    EKG leads   Medications Prior to Visit:   Outpatient Medications Prior to Visit  Medication Sig Dispense Refill   acetaminophen (TYLENOL) 500 MG tablet Take 1,000 mg by mouth every 4 (four) hours as needed for moderate pain or headache.     apixaban (ELIQUIS) 5 MG TABS tablet Take 1 tablet (5 mg total) by mouth 2 (two) times daily. Will need to see MD for future refills. 180 tablet 1   B-D UF III MINI PEN NEEDLES 31G X 5 MM MISC SMARTSIG:1 Each SUB-Q Daily     clopidogrel (PLAVIX) 75 MG tablet Take 1 tablet (75 mg total) by mouth daily. 90 tablet 3   DULoxetine (CYMBALTA) 60 MG capsule Take 60 mg by mouth daily.     empagliflozin (JARDIANCE) 10 MG TABS tablet Take 1 tablet (10 mg total) by mouth daily. 30 tablet 0   ezetimibe (ZETIA) 10 MG tablet Take 10 mg by mouth daily.     Ferrous Sulfate Dried (HIGH POTENCY IRON) 65 MG TABS Take 65 mg by mouth daily.     fesoterodine (TOVIAZ) 8 MG TB24 tablet Take 8 mg by mouth every evening.      fluticasone (FLONASE) 50 MCG/ACT nasal spray Place 1 spray into both nostrils 2 (two) times daily as needed for  allergies or rhinitis.     FREESTYLE LITE test strip Check blood sugar every morning     furosemide (LASIX) 40 MG tablet Take 1 tablet (40 mg total) by mouth daily. 30 tablet 0   gabapentin (NEURONTIN) 300 MG capsule Take 300 mg by mouth 2 (two) times daily.     hydrOXYzine (ATARAX/VISTARIL) 25 MG tablet Take 25-50 mg by mouth 3 (three) times daily. 25 mg in the morning 50 mg at bedtime     Insulin Glargine (LANTUS SOLOSTAR) 100 UNIT/ML Solostar Pen Inject 25 Units into the skin daily before breakfast.     isosorbide dinitrate (ISORDIL) 30 MG tablet TAKE 1 TABLET THREE TIMES A DAY (Patient taking differently: Take 30 mg by mouth 2 (two) times daily.) 270  tablet 3   loperamide (IMODIUM A-D) 2 MG tablet Take 2 mg by mouth 4 (four) times daily as needed for diarrhea or loose stools.     metoprolol succinate (TOPROL-XL) 25 MG 24 hr tablet Take 12.5 mg by mouth 2 (two) times daily.     NAMZARIC 28-10 MG CP24 Take 1 capsule by mouth daily. (Patient taking differently: Take 1 capsule by mouth at bedtime.) 90 capsule 4   NON FORMULARY CPAP at bedtime     pantoprazole (PROTONIX) 40 MG tablet Take 40 mg by mouth daily as needed (heartburn).     potassium chloride SA (KLOR-CON M) 20 MEQ tablet TAKE 1 TABLET DAILY (Patient taking differently: Take 20 mEq by mouth daily.) 90 tablet 3   rosuvastatin (CRESTOR) 10 MG tablet Take 10 mg by mouth daily before supper.     sitaGLIPtin (JANUVIA) 100 MG tablet Take 100 mg by mouth daily.     Vibegron (GEMTESA) 75 MG TABS Take 75 mg by mouth daily.     vitamin B-12 (CYANOCOBALAMIN) 1000 MCG tablet Take 1,000 mcg by mouth at bedtime.     amiodarone (PACERONE) 200 MG tablet Take 1 tablet (200 mg total) by mouth 2 (two) times daily. 60 tablet 0   nitroGLYCERIN (NITROSTAT) 0.4 MG SL tablet Place 1 tablet (0.4 mg total) under the tongue every 5 (five) minutes as needed for up to 25 days for chest pain. (Patient taking differently: Place 0.4 mg under the tongue every 5 (five)  minutes as needed for chest pain.) 90 tablet 0   loratadine (CLARITIN) 10 MG tablet Take 10 mg by mouth daily as needed (allergies).     omeprazole (PRILOSEC) 20 MG capsule Take 20 mg by mouth every morning.     No facility-administered medications prior to visit.   Final Medications at End of Visit    Current Meds  Medication Sig   acetaminophen (TYLENOL) 500 MG tablet Take 1,000 mg by mouth every 4 (four) hours as needed for moderate pain or headache.   apixaban (ELIQUIS) 5 MG TABS tablet Take 1 tablet (5 mg total) by mouth 2 (two) times daily. Will need to see MD for future refills.   B-D UF III MINI PEN NEEDLES 31G X 5 MM MISC SMARTSIG:1 Each SUB-Q Daily   clopidogrel (PLAVIX) 75 MG tablet Take 1 tablet (75 mg total) by mouth daily.   DULoxetine (CYMBALTA) 60 MG capsule Take 60 mg by mouth daily.   empagliflozin (JARDIANCE) 10 MG TABS tablet Take 1 tablet (10 mg total) by mouth daily.   ezetimibe (ZETIA) 10 MG tablet Take 10 mg by mouth daily.   Ferrous Sulfate Dried (HIGH POTENCY IRON) 65 MG TABS Take 65 mg by mouth daily.   fesoterodine (TOVIAZ) 8 MG TB24 tablet Take 8 mg by mouth every evening.    fluticasone (FLONASE) 50 MCG/ACT nasal spray Place 1 spray into both nostrils 2 (two) times daily as needed for allergies or rhinitis.   FREESTYLE LITE test strip Check blood sugar every morning   furosemide (LASIX) 40 MG tablet Take 1 tablet (40 mg total) by mouth daily.   gabapentin (NEURONTIN) 300 MG capsule Take 300 mg by mouth 2 (two) times daily.   hydrOXYzine (ATARAX/VISTARIL) 25 MG tablet Take 25-50 mg by mouth 3 (three) times daily. 25 mg in the morning 50 mg at bedtime   Insulin Glargine (LANTUS SOLOSTAR) 100 UNIT/ML Solostar Pen Inject 25 Units into the skin daily before breakfast.   isosorbide dinitrate (ISORDIL)  30 MG tablet TAKE 1 TABLET THREE TIMES A DAY (Patient taking differently: Take 30 mg by mouth 2 (two) times daily.)   loperamide (IMODIUM A-D) 2 MG tablet Take 2 mg by  mouth 4 (four) times daily as needed for diarrhea or loose stools.   metoprolol succinate (TOPROL-XL) 25 MG 24 hr tablet Take 12.5 mg by mouth 2 (two) times daily.   NAMZARIC 28-10 MG CP24 Take 1 capsule by mouth daily. (Patient taking differently: Take 1 capsule by mouth at bedtime.)   NON FORMULARY CPAP at bedtime   pantoprazole (PROTONIX) 40 MG tablet Take 40 mg by mouth daily as needed (heartburn).   potassium chloride SA (KLOR-CON M) 20 MEQ tablet TAKE 1 TABLET DAILY (Patient taking differently: Take 20 mEq by mouth daily.)   rosuvastatin (CRESTOR) 10 MG tablet Take 10 mg by mouth daily before supper.   sitaGLIPtin (JANUVIA) 100 MG tablet Take 100 mg by mouth daily.   Vibegron (GEMTESA) 75 MG TABS Take 75 mg by mouth daily.   vitamin B-12 (CYANOCOBALAMIN) 1000 MCG tablet Take 1,000 mcg by mouth at bedtime.   [DISCONTINUED] amiodarone (PACERONE) 200 MG tablet Take 1 tablet (200 mg total) by mouth 2 (two) times daily.   Radiology:   CT Angio Neck 08/23/2018: 1. Left subclavian origin high-grade stenosis with delayed enhancement of the distal left vertebral artery. Additionally, there is irregular plaque/mural thrombus involving the aortic isthmus and left subclavian origin. 2. Left PCA branch occlusion correlating with the known acute infarct. 3. Cervical carotid atherosclerosis with up to 50% stenosis at the left ICA bulb. 4. 3 cm right thyroid mass, recommend elective ultrasound.  CXR PA/Lat 05/10/2019: The heart size and mediastinal contours are within normal limits. Prior CABG again noted. Aortic atherosclerosis incidentally noted. Cardiac loop recorder is seen anterior chest wall. Both lungs are clear. The visualized skeletal structures are unremarkable.  Chest x-ray 10/07/2019: Stable cardiomediastinal silhouette. No pneumothorax or pleural effusion is noted. Both lungs are clear. The visualized skeletal structures are unremarkable IMPRESSION: No active cardiopulmonary  disease.  Cardiac Studies:   Coronary angiogram 1996: [place] 80% stenosis ostial Left main, 30% stenosis proximal LAD, 80% stenosis proximal RCA CABG  -  LIMA to LAD, SVG to int, SVG to [place], [place]   Lexiscan Tetrofosmin Stress Test  05/15/2019: Nondiagnostic ECG stress. Myocardial perfusion is abnormal. Mild degree large extent perfusion defect consistent with moderate ischemia located in the apical inferior wall, mid inferior wall, mid inferoseptal wall, basal inferior wall and basal inferoseptal wall (Right Coronary Artery region) of left ventricle. Gated SPECT imaging of the left ventricle was normal,  demonstrating hypokinesis of the apical inferior wall, mid inferior wall and basal inferior wall.  Stress LV EF is normal 55%.  Compared to the study done on 10/24/2016, previously minimal ischemia was evident and LVEF was 72%.  Low risk.  Carotid artery duplex 03/04/2021: < 50% ICA stenosis bilaterally. Moderate plaquing bilaterally at bifurcations/proximal to mid ICAs.  Antegrade right vertebral artery flow.  Retrograde left VA flow - suggestive of possible subclavian disease, though not obvious subclavian stenosis noted w/out evidence of elevated velocity.  Consider further imaging as clinically indicated. Compared to study 11/27/2019, no significant change.  Follow-up in 6 months is appropriate if clinically indicated  Echocardiogram 07/07/2021: 1. Left ventricular ejection fraction, by estimation, is 35 to 40%. The  left ventricle has moderately decreased function. The left ventricle  demonstrates regional wall motion abnormalities (see scoring  diagram/findings for description).  There is mild  left ventricular hypertrophy. Left ventricular diastolic parameters are  indeterminate. Elevated left ventricular end-diastolic pressure. There is  severe of the left ventricular, entire anterior wall. There is mild of the  left ventricular, mid-apical  anterolateral wall.    2. Right ventricular systolic function is normal. The right ventricular  size is normal. There is normal pulmonary artery systolic pressure. The  estimated right ventricular systolic pressure is 33.5 mmHg.   3. Left atrial size was mildly dilated.   4. The mitral valve is normal in structure. Mild mitral valve  regurgitation.   5. The aortic valve is tricuspid. Aortic valve regurgitation is mild to  moderate. Aortic valve sclerosis/calcification is present, without any  evidence of aortic stenosis.   6. The inferior vena cava is normal in size with <50% respiratory  variability, suggesting right atrial pressure of 8 mmHg.   Comparison(s): Changes from prior study are noted. Compared to 08/23/2018,  LVEF 60-65% without regional wall motion abnormality. Mild MR and TR new.   Coronary angiogram 07/12/2021: LM: Ostial 95% stenosis LAD: Ostial 90% calcifific stenosis         Competitive flow from LIMA into mid LAD Lcx: No significant disease Ramus: mid 70% stenosis, bypassed by SVG-ramus RCA: Patent prox-distal RCA stents LIMA-LAD: Patent with competitive flow (Likely due to filling of native LAD through ramus system that is bypassed by SVG-ramus) SVG-ramus: Patent SVG-PDA: Not engaged today. Known poor antegrade flow. Also, native RCA patent with excellent antegrade flow   RA: 5 mmHg RV: 31/1 mmHg PA: 34/15 mmHg, mPAP 23 mmHg PCW: 12 mmHg LVEDP 13 mmHg   CO: 4.0 L/min CI: 2.4 L/min/m2   Severe native vessel disease with patent LIMA-LAD, SVG-ramus Compensated cardiomyopathy, possible nonischemic, well compensated   Okay to discharge from cardiac standpoint on metoprolol. Will add other GDMT outpatient after renal functional recovery.     Device Clinic: [employer] Loop recorder   Remote loop recorder transmission 02/15/2021: Predominant rhythm is normal sinus rhythm. There were 2 episodes of atrial fibrillation, 1 hour 48 minutes and 4 hours duration AT/AF burden  7.6%.  Unscheduled (Alert) 01/31/2020:  6 hours 38 minutes of A. fib on 01/29/2020. Total AF burden 22% since last transmission 01/11/2020. 11/15/2019: 5 hrs PAF on 10/8. 12/10/2019: 2.7% PAF with controlled ventricular response, 28 AF episodes and longest 2 hrs, 22 mins on 12/07/2019. Patient is on anticoagulation and will turn of alert and watch burden ongoing  EKG  07/28/2021: Probable sinus bradycardia at a rate of 52 bpm.  Normal axis.  Right bundle branch block.  03/11/2021: Probable sinus rhythm with left atrial abnormality.  Left axis.  Right bundle branch block.  Single PAC.  Nonspecific inferior and lateral ST depression, consider ischemia.  EKG 01/14/2021: Probably sinus rhythm with left atrial abnormality.  Normal axis, incomplete right bundle branch block.  Nonspecific inferior and lateral ST segment depression, consider subendocardial ischemia versus infarct.  Consider digitalis effect.  EKG similar to 10/01/2019, on 06/23/2020, she had right bundle branch block.  Assessment     ICD-10-CM   1. Chronic diastolic (congestive) heart failure (HCC)  I50.32 EKG 12-Lead    2. Coronary artery disease involving native coronary artery of native heart without angina pectoris  I25.10     3. Paroxysmal atrial fibrillation (HCC)  I48.0       CHA2DS2-VASc Score is 7.  Yearly risk of stroke: > 9.8% (F, A, HTN, DM, Vasc Dz).  Score of 1=0.6; 2=2.2; 3=3.2; 4=4.8;  5=7.2; 6=9.8; 7=>9.8) -(CHF; HTN; vasc disease DM,  Female = 1; Age <65 =0; 65-74 = 1,  >75 =2; stroke/embolism= 2).    Meds ordered this encounter  Medications   amiodarone (PACERONE) 200 MG tablet    Sig: Take 1 tablet (200 mg total) by mouth daily.    Dispense:  30 tablet    Refill:  0     Medications Discontinued During This Encounter  Medication Reason   loratadine (CLARITIN) 10 MG tablet    omeprazole (PRILOSEC) 20 MG capsule    amiodarone (PACERONE) 200 MG tablet       Recommendations:   Linda Oliver  is a 88 y.o.  Caucasian female patient with CAD s/p CABG x 3 in 1996 in [place], HTN, hyperlipidemia, diabetes with PVD and stage 3a CKD and sleep apnea on CPAP and now compliant, carotid stenosis, and history of  autonomic orthostatic hypotension, CVA with medial left occipital infarct in embolic pattern by MRI in 2020 and loop recorder implanted showed paroxysmal atrial fibrillation.    Admitted with unstable angina and underwent coronary angiography on 03/05/2021 and overlapping stent plantation to the right coronary artery native as the SVG to RCA was functionally occluded.  Left grafts are widely patent.  Patient was again admitted 07/07/2021 - 07/13/2021 with acute heart failure.  She underwent cardiac cath which revealed patent stents, no PCI.  Patient was diuresed approximately 5 L and discharged on amiodarone 4 rate and rhythm control of atrial fibrillation as well as Jardiance.  Patient now presents for follow-up.  Dyspnea has significantly improved since discharge.  She appears to be tolerating addition of Jardiance without issue.  She had recent repeat labs done by PCP, I have requested these records for review.  Is no clinical evidence of acute heart failure at today's office visit.  She is presently maintaining normal sinus rhythm.  Given concern of bradycardia as well as patient's complaints of tremors will reduce amiodarone from 200 mg twice daily to 200 mg once daily and continue to monitor.  Visit well-controlled.  Blood pressure remains soft, however she appears asymptomatic from this standpoint.  We will continue to monitor closely.  Could consider repeat echocardiogram at next office visit to reevaluate LVEF.  Follow-up in 8 weeks, sooner if needed.   Alethia Berthold, PA-C 07/28/2021, 3:59 PM Office: [phone]

## 2021-07-29 DIAGNOSIS — I5020 Unspecified systolic (congestive) heart failure: Secondary | ICD-10-CM | POA: Diagnosis not present

## 2021-07-29 DIAGNOSIS — E1122 Type 2 diabetes mellitus with diabetic chronic kidney disease: Secondary | ICD-10-CM | POA: Diagnosis not present

## 2021-07-29 DIAGNOSIS — N183 Chronic kidney disease, stage 3 unspecified: Secondary | ICD-10-CM | POA: Diagnosis not present

## 2021-07-29 DIAGNOSIS — I130 Hypertensive heart and chronic kidney disease with heart failure and stage 1 through stage 4 chronic kidney disease, or unspecified chronic kidney disease: Secondary | ICD-10-CM | POA: Diagnosis not present

## 2021-07-29 DIAGNOSIS — D631 Anemia in chronic kidney disease: Secondary | ICD-10-CM | POA: Diagnosis not present

## 2021-07-29 DIAGNOSIS — I5033 Acute on chronic diastolic (congestive) heart failure: Secondary | ICD-10-CM | POA: Diagnosis not present

## 2021-07-30 ENCOUNTER — Encounter: Payer: Self-pay | Admitting: Cardiology

## 2021-07-30 DIAGNOSIS — I5033 Acute on chronic diastolic (congestive) heart failure: Secondary | ICD-10-CM | POA: Diagnosis not present

## 2021-07-30 DIAGNOSIS — D631 Anemia in chronic kidney disease: Secondary | ICD-10-CM | POA: Diagnosis not present

## 2021-07-30 DIAGNOSIS — E1122 Type 2 diabetes mellitus with diabetic chronic kidney disease: Secondary | ICD-10-CM | POA: Diagnosis not present

## 2021-07-30 DIAGNOSIS — N183 Chronic kidney disease, stage 3 unspecified: Secondary | ICD-10-CM | POA: Diagnosis not present

## 2021-07-30 DIAGNOSIS — I5020 Unspecified systolic (congestive) heart failure: Secondary | ICD-10-CM | POA: Diagnosis not present

## 2021-07-30 DIAGNOSIS — I130 Hypertensive heart and chronic kidney disease with heart failure and stage 1 through stage 4 chronic kidney disease, or unspecified chronic kidney disease: Secondary | ICD-10-CM | POA: Diagnosis not present

## 2021-08-02 ENCOUNTER — Ambulatory Visit (INDEPENDENT_AMBULATORY_CARE_PROVIDER_SITE_OTHER): Payer: Medicare Other | Admitting: Ophthalmology

## 2021-08-02 ENCOUNTER — Encounter (INDEPENDENT_AMBULATORY_CARE_PROVIDER_SITE_OTHER): Payer: Self-pay | Admitting: Ophthalmology

## 2021-08-02 DIAGNOSIS — H35371 Puckering of macula, right eye: Secondary | ICD-10-CM

## 2021-08-02 DIAGNOSIS — H26492 Other secondary cataract, left eye: Secondary | ICD-10-CM

## 2021-08-02 DIAGNOSIS — I2510 Atherosclerotic heart disease of native coronary artery without angina pectoris: Secondary | ICD-10-CM | POA: Diagnosis not present

## 2021-08-02 DIAGNOSIS — H209 Unspecified iridocyclitis: Secondary | ICD-10-CM | POA: Diagnosis not present

## 2021-08-02 DIAGNOSIS — T85398A Other mechanical complication of other ocular prosthetic devices, implants and grafts, initial encounter: Secondary | ICD-10-CM

## 2021-08-02 DIAGNOSIS — H4042X0 Glaucoma secondary to eye inflammation, left eye, stage unspecified: Secondary | ICD-10-CM

## 2021-08-02 DIAGNOSIS — H4312 Vitreous hemorrhage, left eye: Secondary | ICD-10-CM | POA: Diagnosis not present

## 2021-08-02 NOTE — Assessment & Plan Note (Signed)
OS doing well no recurrence of hemorrhage.  No sign of syndrome recurring.  Hemorrhage encapsulated in the posterior capsule around the IOL is now been released

## 2021-08-02 NOTE — Assessment & Plan Note (Signed)
Minor no interval change over time by OCT

## 2021-08-02 NOTE — Progress Notes (Signed)
08/02/2021     CHIEF COMPLAINT Patient presents for  Chief Complaint  Patient presents with   Retina Evaluation      HISTORY OF PRESENT ILLNESS: Linda Oliver is a 88 y.o. female who presents to the clinic today for:   HPI     Retina Evaluation           Laterality: left eye         Comments   1 week dilate OS COLOR FP, OCT history of recurrent vitreous hemorrhages.  Recently status post YAG capsulotomy for hemorrhage encapsulated in the capsule Pt states her vision has been stable Pt denies any new floaters or FOL  Pt states "seems to have two like I'm crossed eyed" visual acuity nicely improved post recent YAG capsulotomy left eye       Last edited by Hurman Horn, MD on 08/02/2021  2:24 PM.      Referring physician: Shon Baton, MD [place],  [place] [address]  HISTORICAL INFORMATION:   Selected notes from the MEDICAL RECORD NUMBER    Lab Results  Component Value Date   HGBA1C 7.8 (H) 07/07/2021     CURRENT MEDICATIONS: No current outpatient medications on file. (Ophthalmic Drugs)   No current facility-administered medications for this visit. (Ophthalmic Drugs)   Current Outpatient Medications (Other)  Medication Sig   acetaminophen (TYLENOL) 500 MG tablet Take 1,000 mg by mouth every 4 (four) hours as needed for moderate pain or headache.   amiodarone (PACERONE) 200 MG tablet Take 1 tablet (200 mg total) by mouth daily.   apixaban (ELIQUIS) 5 MG TABS tablet Take 1 tablet (5 mg total) by mouth 2 (two) times daily. Will need to see MD for future refills.   B-D UF III MINI PEN NEEDLES 31G X 5 MM MISC SMARTSIG:1 Each SUB-Q Daily   clopidogrel (PLAVIX) 75 MG tablet Take 1 tablet (75 mg total) by mouth daily.   DULoxetine (CYMBALTA) 60 MG capsule Take 60 mg by mouth daily.   empagliflozin (JARDIANCE) 10 MG TABS tablet Take 1 tablet (10 mg total) by mouth daily.   ezetimibe (ZETIA) 10 MG tablet Take 10 mg by mouth daily.   Ferrous Sulfate  Dried (HIGH POTENCY IRON) 65 MG TABS Take 65 mg by mouth daily.   fesoterodine (TOVIAZ) 8 MG TB24 tablet Take 8 mg by mouth every evening.    fluticasone (FLONASE) 50 MCG/ACT nasal spray Place 1 spray into both nostrils 2 (two) times daily as needed for allergies or rhinitis.   FREESTYLE LITE test strip Check blood sugar every morning   furosemide (LASIX) 40 MG tablet Take 1 tablet (40 mg total) by mouth daily.   gabapentin (NEURONTIN) 300 MG capsule Take 300 mg by mouth 2 (two) times daily.   hydrOXYzine (ATARAX/VISTARIL) 25 MG tablet Take 25-50 mg by mouth 3 (three) times daily. 25 mg in the morning 50 mg at bedtime   Insulin Glargine (LANTUS SOLOSTAR) 100 UNIT/ML Solostar Pen Inject 25 Units into the skin daily before breakfast.   isosorbide dinitrate (ISORDIL) 30 MG tablet TAKE 1 TABLET THREE TIMES A DAY (Patient taking differently: Take 30 mg by mouth 2 (two) times daily.)   loperamide (IMODIUM A-D) 2 MG tablet Take 2 mg by mouth 4 (four) times daily as needed for diarrhea or loose stools.   metoprolol succinate (TOPROL-XL) 25 MG 24 hr tablet Take 12.5 mg by mouth 2 (two) times daily.   NAMZARIC 28-10 MG CP24 Take  1 capsule by mouth daily. (Patient taking differently: Take 1 capsule by mouth at bedtime.)   nitroGLYCERIN (NITROSTAT) 0.4 MG SL tablet Place 1 tablet (0.4 mg total) under the tongue every 5 (five) minutes as needed for up to 25 days for chest pain. (Patient taking differently: Place 0.4 mg under the tongue every 5 (five) minutes as needed for chest pain.)   NON FORMULARY CPAP at bedtime   pantoprazole (PROTONIX) 40 MG tablet Take 40 mg by mouth daily as needed (heartburn).   potassium chloride SA (KLOR-CON M) 20 MEQ tablet TAKE 1 TABLET DAILY (Patient taking differently: Take 20 mEq by mouth daily.)   rosuvastatin (CRESTOR) 10 MG tablet Take 10 mg by mouth daily before supper.   sitaGLIPtin (JANUVIA) 100 MG tablet Take 100 mg by mouth daily.   Vibegron (GEMTESA) 75 MG TABS Take 75  mg by mouth daily.   vitamin B-12 (CYANOCOBALAMIN) 1000 MCG tablet Take 1,000 mcg by mouth at bedtime.   No current facility-administered medications for this visit. (Other)      REVIEW OF SYSTEMS: ROS   Negative for: Constitutional, Gastrointestinal, Neurological, Skin, Genitourinary, Musculoskeletal, HENT, Endocrine, Cardiovascular, Eyes, Respiratory, Psychiatric, Allergic/Imm, Heme/Lymph Last edited by Morene Rankins, CMA on 08/02/2021  1:37 PM.       ALLERGIES Allergies  Allergen Reactions   Codeine     vomiting   Metformin     Other reaction(s): nausea, vomitting and diarrhea   Oxycodone-Acetaminophen     Other reaction(s): Unknown   Percocet [Oxycodone-Acetaminophen] Nausea And Vomiting   Tape     Other reaction(s): Unknown   Tolterodine Other (See Comments)   Latex Itching and Rash    EKG leads    PAST MEDICAL HISTORY Past Medical History:  Diagnosis Date   Anginal pain ([place])    occ   Anxiety    Broken ankle    Bullous pemphigus    CAD (coronary artery disease)    Cath [place]   80% stenosis ostial Left main, 30% stenosis proximal LAD, 80% stenosis proximal RCA CABG  -  LIMA to LAD, SVG to int, SVG to [place], [place]    Depression    Diabetic peripheral neuropathy ([place])    Encounter for loop recorder check 04/11/2019   GERD (gastroesophageal reflux disease)    H/O: stroke: Left occipital stroke and vision loss 08/24/2018   Headache(784.0)    hx migraines   Heart murmur    mvp   Hyperlipidemia    Hypertension    Hypertensive heart disease    Loop recorder Reveal 08/24/2018 04/11/2019   Scheduled Remote loop recorder check 03/10/2019: The presenting rhythm is sinus bradycardia. There were 0 symptomatic patient activations recorded. There were 275 AF episodes and 0 AT episodes recorded, representing a 4.2 % cumulative AT/AF burden. The longest episode lasted 00:03:40:00 in duration. Some false with PAC. There were 0 tachy episodes detected.  There were 0 pause episodes detected. Th   Nasal bleeding    occ due to O2   OA (osteoarthritis)    Obesity (BMI 30-39.9)    Obstructive sleep apnea on CPAP    Osteopenia    Paroxysmal atrial fibrillation ([place]) 04/17/2019   Peripheral neuropathy    Seizure ([place])    2009   Sleep apnea    dx 73yr ago doesnt use cpap but uses O2 at nite 2L   Stones in the urinary tract    Type 2 diabetes mellitus with vascular disease (  [place])    UTI (lower urinary tract infection)    Past Surgical History:  Procedure Laterality Date   ABDOMINAL HYSTERECTOMY  70's   CARDIAC CATHETERIZATION     CARDIOVERSION N/A 07/08/2021   Procedure: CARDIOVERSION;  Surgeon: Adrian Prows, MD;  Location: [facility];  Service: Cardiovascular;  Laterality: N/A;   CARPAL TUNNEL RELEASE Bilateral    CATARACT EXTRACTION W/PHACO Bilateral    CHOLECYSTECTOMY     CORONARY ANGIOPLASTY WITH STENT PLACEMENT  2023   CORONARY ARTERY BYPASS GRAFT  1996   EYE SURGERY Left    LOOP RECORDER INSERTION N/A 08/24/2018   Procedure: LOOP RECORDER INSERTION;  Surgeon: Constance Haw, MD;  Location: [facility];  Service: Cardiovascular;  Laterality: N/A;   RIGHT/LEFT HEART CATH AND CORONARY/GRAFT ANGIOGRAPHY N/A 07/12/2021   Procedure: RIGHT/LEFT HEART CATH AND CORONARY/GRAFT ANGIOGRAPHY;  Surgeon: Nigel Mormon, MD;  Location: [facility];  Service: Cardiovascular;  Laterality: N/A;   SHOULDER ARTHROSCOPY     rt   SHOULDER ARTHROSCOPY WITH ROTATOR CUFF REPAIR AND SUBACROMIAL DECOMPRESSION  12/01/2011   Procedure: SHOULDER ARTHROSCOPY WITH ROTATOR CUFF REPAIR AND SUBACROMIAL DECOMPRESSION;  Surgeon: Nita Sells, MD;  Location: [facility];  Service: Orthopedics;  Laterality: Left;  shoulder debridement calcific tendonitis    FAMILY HISTORY Family History  Problem Relation Age of Onset   Coronary artery disease Mother    Heart disease Father    Cancer Father        skin   Coronary artery disease Father     Cancer Brother    Cancer - Colon Brother        brain, Lymphoma    SOCIAL HISTORY Social History   Tobacco Use   Smoking status: Former    Packs/day: 1.00    Years: 30.00    Total pack years: 30.00    Types: Cigarettes    Quit date: 11/25/1986    Years since quitting: 34.7   Smokeless tobacco: Never  Vaping Use   Vaping Use: Never used  Substance Use Topics   Alcohol use: No   Drug use: No         OPHTHALMIC EXAM:  Base Eye Exam     Visual Acuity (ETDRS)       Right Left   Dist cc 20/30 20/60         Tonometry (Tonopen, 1:46 PM)       Right Left   Pressure 5 10         Neuro/Psych     Oriented x3: Yes   Mood/Affect: Normal         Dilation     Left eye: 2.5% Phenylephrine, 1.0% Mydriacyl @ 1:42 PM           Slit Lamp and Fundus Exam     External Exam       Right Left   External Normal Normal         Slit Lamp Exam       Right Left   Lids/Lashes Normal Normal   Conjunctiva/Sclera White and quiet White and quiet   Cornea Clear Clear   Anterior Chamber Deep and quiet Deep and quiet   Iris Round and reactive Round and reactive   Lens Posterior chamber intraocular lens, Open posterior capsule Posterior chamber intraocular lens, open pc, no heme in capsule bag   Anterior Vitreous Normal Normal         Fundus Exam  Right Left   Posterior Vitreous  AVITRIC   Disc  Drusen   C/D Ratio  0.55   Macula  Old macular hole, close negative Watzke   Vessels  Normal, no DR   Periphery  Normal, no holes or tears            IMAGING AND PROCEDURES  Imaging and Procedures for 08/02/21  OCT, Retina - OU - Both Eyes       Right Eye Quality was good. Scan locations included subfoveal. Central Foveal Thickness: 280. Progression has been stable. Findings include abnormal foveal contour, epiretinal membrane.   Left Eye Quality was good. Scan locations included subfoveal. Central Foveal Thickness: 264. Progression has been  stable. Findings include abnormal foveal contour.   Notes OD minor epiretinal membrane no impact on acuity observe  OS classic findings of post macular hole and its repair subtle atrophy temporally, media opacity     Color Fundus Photography Optos - OU - Both Eyes       Right Eye Progression has been stable. Disc findings include normal observations. Macula : epiretinal membrane.   Left Eye Progression has been stable. Disc findings include normal observations. Macula : normal observations. Vessels : normal observations. Periphery : normal observations.   Notes OD with posterior vitreous detachment, clear media and minor epiretinal membrane without topographic distortion  OS, history of macular hole, now with clear media             ASSESSMENT/PLAN:  Vitreous hemorrhage of left eye (HCC) OS doing well no recurrence of hemorrhage.  No sign of syndrome recurring.  Hemorrhage encapsulated in the posterior capsule around the IOL is now been released  Posterior capsular opacification, left Improved post capsulotomy and release of the hemorrhage blocking the visual axis  Uveitis-hyphema-glaucoma syndrome of left eye No cells in the anterior chamber  Right epiretinal membrane Minor no interval change over time by OCT     ICD-10-CM   1. Vitreous hemorrhage of left eye (HCC)  H43.12 OCT, Retina - OU - Both Eyes    Color Fundus Photography Optos - OU - Both Eyes    2. Posterior capsular opacification, left  H26.492     3. Uveitis-hyphema-glaucoma syndrome of left eye  T85.398A    H20.9    H40.42X0     4. Right epiretinal membrane  H35.371       1.  OD minor epiretinal membrane no interval change in appearance or acuity  2.  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  3.  Ophthalmic Meds Ordered this visit:  No orders of the defined types were placed in this  encounter.      Return in about 3 months (around 11/02/2021) for DILATE OU, COLOR FP.  There are no Patient Instructions on file for this visit.   Explained the diagnoses, plan, and follow up with the patient and they expressed understanding.  Patient expressed understanding of the importance of proper follow up care.   Clent Demark Hughie Melroy M.D. Diseases & Surgery of the Retina and Vitreous [facility] [place] 08/02/21     Abbreviations: M myopia (nearsighted); A astigmatism; H hyperopia (farsighted); P presbyopia; Mrx spectacle prescription;  CTL contact lenses; OD right eye; OS left eye; OU both eyes  XT exotropia; ET esotropia; PEK punctate epithelial keratitis; PEE punctate epithelial erosions; DES dry eye syndrome; MGD meibomian gland dysfunction; ATs artificial tears; PFAT's  preservative free artificial tears; [place] nuclear sclerotic cataract; PSC posterior subcapsular cataract; ERM epi-retinal membrane; PVD posterior vitreous detachment; RD retinal detachment; DM diabetes mellitus; DR diabetic retinopathy; NPDR non-proliferative diabetic retinopathy; PDR proliferative diabetic retinopathy; CSME clinically significant macular edema; DME diabetic macular edema; dbh dot blot hemorrhages; CWS cotton wool spot; POAG primary open angle glaucoma; C/D cup-to-disc ratio; HVF humphrey visual field; GVF goldmann visual field; OCT optical coherence tomography; IOP intraocular pressure; BRVO Branch retinal vein occlusion; CRVO central retinal vein occlusion; CRAO central retinal artery occlusion; BRAO branch retinal artery occlusion; RT retinal tear; SB scleral buckle; PPV pars plana vitrectomy; VH Vitreous hemorrhage; PRP panretinal laser photocoagulation; IVK intravitreal kenalog; VMT vitreomacular traction; MH Macular hole;  NVD neovascularization of the disc; NVE neovascularization elsewhere; AREDS age related eye disease study; ARMD age related macular degeneration; POAG primary open angle  glaucoma; EBMD epithelial/anterior basement membrane dystrophy; ACIOL anterior chamber intraocular lens; IOL intraocular lens; PCIOL posterior chamber intraocular lens; Phaco/IOL phacoemulsification with intraocular lens placement; [place] photorefractive keratectomy; LASIK laser assisted in situ keratomileusis; HTN hypertension; DM diabetes mellitus; COPD chronic obstructive pulmonary disease

## 2021-08-02 NOTE — Assessment & Plan Note (Signed)
Improved post capsulotomy and release of the hemorrhage blocking the visual axis

## 2021-08-02 NOTE — Assessment & Plan Note (Signed)
No cells in the anterior chamber

## 2021-08-03 DIAGNOSIS — I5033 Acute on chronic diastolic (congestive) heart failure: Secondary | ICD-10-CM | POA: Diagnosis not present

## 2021-08-03 DIAGNOSIS — D631 Anemia in chronic kidney disease: Secondary | ICD-10-CM | POA: Diagnosis not present

## 2021-08-03 DIAGNOSIS — E1122 Type 2 diabetes mellitus with diabetic chronic kidney disease: Secondary | ICD-10-CM | POA: Diagnosis not present

## 2021-08-03 DIAGNOSIS — I5020 Unspecified systolic (congestive) heart failure: Secondary | ICD-10-CM | POA: Diagnosis not present

## 2021-08-03 DIAGNOSIS — I130 Hypertensive heart and chronic kidney disease with heart failure and stage 1 through stage 4 chronic kidney disease, or unspecified chronic kidney disease: Secondary | ICD-10-CM | POA: Diagnosis not present

## 2021-08-03 DIAGNOSIS — N183 Chronic kidney disease, stage 3 unspecified: Secondary | ICD-10-CM | POA: Diagnosis not present

## 2021-08-05 DIAGNOSIS — H04123 Dry eye syndrome of bilateral lacrimal glands: Secondary | ICD-10-CM | POA: Diagnosis not present

## 2021-08-05 DIAGNOSIS — H43812 Vitreous degeneration, left eye: Secondary | ICD-10-CM | POA: Diagnosis not present

## 2021-08-05 DIAGNOSIS — I480 Paroxysmal atrial fibrillation: Secondary | ICD-10-CM | POA: Diagnosis not present

## 2021-08-05 DIAGNOSIS — Z95818 Presence of other cardiac implants and grafts: Secondary | ICD-10-CM | POA: Diagnosis not present

## 2021-08-05 DIAGNOSIS — Z9889 Other specified postprocedural states: Secondary | ICD-10-CM | POA: Diagnosis not present

## 2021-08-05 DIAGNOSIS — H53461 Homonymous bilateral field defects, right side: Secondary | ICD-10-CM | POA: Diagnosis not present

## 2021-08-05 DIAGNOSIS — E119 Type 2 diabetes mellitus without complications: Secondary | ICD-10-CM | POA: Diagnosis not present

## 2021-08-05 DIAGNOSIS — Z961 Presence of intraocular lens: Secondary | ICD-10-CM | POA: Diagnosis not present

## 2021-08-05 DIAGNOSIS — I6340 Cerebral infarction due to embolism of unspecified cerebral artery: Secondary | ICD-10-CM | POA: Diagnosis not present

## 2021-08-05 DIAGNOSIS — H35373 Puckering of macula, bilateral: Secondary | ICD-10-CM | POA: Diagnosis not present

## 2021-08-05 DIAGNOSIS — Z4509 Encounter for adjustment and management of other cardiac device: Secondary | ICD-10-CM | POA: Diagnosis not present

## 2021-08-06 DIAGNOSIS — D631 Anemia in chronic kidney disease: Secondary | ICD-10-CM | POA: Diagnosis not present

## 2021-08-06 DIAGNOSIS — N183 Chronic kidney disease, stage 3 unspecified: Secondary | ICD-10-CM | POA: Diagnosis not present

## 2021-08-06 DIAGNOSIS — I5033 Acute on chronic diastolic (congestive) heart failure: Secondary | ICD-10-CM | POA: Diagnosis not present

## 2021-08-06 DIAGNOSIS — I5020 Unspecified systolic (congestive) heart failure: Secondary | ICD-10-CM | POA: Diagnosis not present

## 2021-08-06 DIAGNOSIS — I130 Hypertensive heart and chronic kidney disease with heart failure and stage 1 through stage 4 chronic kidney disease, or unspecified chronic kidney disease: Secondary | ICD-10-CM | POA: Diagnosis not present

## 2021-08-06 DIAGNOSIS — E1122 Type 2 diabetes mellitus with diabetic chronic kidney disease: Secondary | ICD-10-CM | POA: Diagnosis not present

## 2021-08-09 DIAGNOSIS — I5033 Acute on chronic diastolic (congestive) heart failure: Secondary | ICD-10-CM | POA: Diagnosis not present

## 2021-08-09 DIAGNOSIS — D631 Anemia in chronic kidney disease: Secondary | ICD-10-CM | POA: Diagnosis not present

## 2021-08-09 DIAGNOSIS — I5020 Unspecified systolic (congestive) heart failure: Secondary | ICD-10-CM | POA: Diagnosis not present

## 2021-08-09 DIAGNOSIS — E1122 Type 2 diabetes mellitus with diabetic chronic kidney disease: Secondary | ICD-10-CM | POA: Diagnosis not present

## 2021-08-09 DIAGNOSIS — I130 Hypertensive heart and chronic kidney disease with heart failure and stage 1 through stage 4 chronic kidney disease, or unspecified chronic kidney disease: Secondary | ICD-10-CM | POA: Diagnosis not present

## 2021-08-09 DIAGNOSIS — N183 Chronic kidney disease, stage 3 unspecified: Secondary | ICD-10-CM | POA: Diagnosis not present

## 2021-08-11 DIAGNOSIS — I5020 Unspecified systolic (congestive) heart failure: Secondary | ICD-10-CM | POA: Diagnosis not present

## 2021-08-11 DIAGNOSIS — D631 Anemia in chronic kidney disease: Secondary | ICD-10-CM | POA: Diagnosis not present

## 2021-08-11 DIAGNOSIS — I130 Hypertensive heart and chronic kidney disease with heart failure and stage 1 through stage 4 chronic kidney disease, or unspecified chronic kidney disease: Secondary | ICD-10-CM | POA: Diagnosis not present

## 2021-08-11 DIAGNOSIS — I5033 Acute on chronic diastolic (congestive) heart failure: Secondary | ICD-10-CM | POA: Diagnosis not present

## 2021-08-11 DIAGNOSIS — N183 Chronic kidney disease, stage 3 unspecified: Secondary | ICD-10-CM | POA: Diagnosis not present

## 2021-08-11 DIAGNOSIS — E1122 Type 2 diabetes mellitus with diabetic chronic kidney disease: Secondary | ICD-10-CM | POA: Diagnosis not present

## 2021-08-12 DIAGNOSIS — N183 Chronic kidney disease, stage 3 unspecified: Secondary | ICD-10-CM | POA: Diagnosis not present

## 2021-08-12 DIAGNOSIS — E1122 Type 2 diabetes mellitus with diabetic chronic kidney disease: Secondary | ICD-10-CM | POA: Diagnosis not present

## 2021-08-12 DIAGNOSIS — D631 Anemia in chronic kidney disease: Secondary | ICD-10-CM | POA: Diagnosis not present

## 2021-08-12 DIAGNOSIS — I5033 Acute on chronic diastolic (congestive) heart failure: Secondary | ICD-10-CM | POA: Diagnosis not present

## 2021-08-12 DIAGNOSIS — I130 Hypertensive heart and chronic kidney disease with heart failure and stage 1 through stage 4 chronic kidney disease, or unspecified chronic kidney disease: Secondary | ICD-10-CM | POA: Diagnosis not present

## 2021-08-12 DIAGNOSIS — I5020 Unspecified systolic (congestive) heart failure: Secondary | ICD-10-CM | POA: Diagnosis not present

## 2021-08-13 NOTE — Telephone Encounter (Signed)
From patient.

## 2021-08-15 DIAGNOSIS — I214 Non-ST elevation (NSTEMI) myocardial infarction: Secondary | ICD-10-CM | POA: Diagnosis not present

## 2021-08-15 DIAGNOSIS — E1122 Type 2 diabetes mellitus with diabetic chronic kidney disease: Secondary | ICD-10-CM | POA: Diagnosis not present

## 2021-08-15 DIAGNOSIS — J181 Lobar pneumonia, unspecified organism: Secondary | ICD-10-CM | POA: Diagnosis not present

## 2021-08-15 DIAGNOSIS — J9601 Acute respiratory failure with hypoxia: Secondary | ICD-10-CM | POA: Diagnosis not present

## 2021-08-15 DIAGNOSIS — E669 Obesity, unspecified: Secondary | ICD-10-CM | POA: Diagnosis not present

## 2021-08-15 DIAGNOSIS — I4581 Long QT syndrome: Secondary | ICD-10-CM | POA: Diagnosis not present

## 2021-08-15 DIAGNOSIS — K219 Gastro-esophageal reflux disease without esophagitis: Secondary | ICD-10-CM | POA: Diagnosis not present

## 2021-08-15 DIAGNOSIS — N183 Chronic kidney disease, stage 3 unspecified: Secondary | ICD-10-CM | POA: Diagnosis not present

## 2021-08-15 DIAGNOSIS — E785 Hyperlipidemia, unspecified: Secondary | ICD-10-CM | POA: Diagnosis not present

## 2021-08-15 DIAGNOSIS — I480 Paroxysmal atrial fibrillation: Secondary | ICD-10-CM | POA: Diagnosis not present

## 2021-08-15 DIAGNOSIS — I2510 Atherosclerotic heart disease of native coronary artery without angina pectoris: Secondary | ICD-10-CM | POA: Diagnosis not present

## 2021-08-15 DIAGNOSIS — H4312 Vitreous hemorrhage, left eye: Secondary | ICD-10-CM | POA: Diagnosis not present

## 2021-08-15 DIAGNOSIS — D631 Anemia in chronic kidney disease: Secondary | ICD-10-CM | POA: Diagnosis not present

## 2021-08-15 DIAGNOSIS — L120 Bullous pemphigoid: Secondary | ICD-10-CM | POA: Diagnosis not present

## 2021-08-15 DIAGNOSIS — I5033 Acute on chronic diastolic (congestive) heart failure: Secondary | ICD-10-CM | POA: Diagnosis not present

## 2021-08-15 DIAGNOSIS — E1142 Type 2 diabetes mellitus with diabetic polyneuropathy: Secondary | ICD-10-CM | POA: Diagnosis not present

## 2021-08-15 DIAGNOSIS — N179 Acute kidney failure, unspecified: Secondary | ICD-10-CM | POA: Diagnosis not present

## 2021-08-15 DIAGNOSIS — G4733 Obstructive sleep apnea (adult) (pediatric): Secondary | ICD-10-CM | POA: Diagnosis not present

## 2021-08-15 DIAGNOSIS — F419 Anxiety disorder, unspecified: Secondary | ICD-10-CM | POA: Diagnosis not present

## 2021-08-15 DIAGNOSIS — N390 Urinary tract infection, site not specified: Secondary | ICD-10-CM | POA: Diagnosis not present

## 2021-08-15 DIAGNOSIS — E1169 Type 2 diabetes mellitus with other specified complication: Secondary | ICD-10-CM | POA: Diagnosis not present

## 2021-08-15 DIAGNOSIS — I5020 Unspecified systolic (congestive) heart failure: Secondary | ICD-10-CM | POA: Diagnosis not present

## 2021-08-15 DIAGNOSIS — D72829 Elevated white blood cell count, unspecified: Secondary | ICD-10-CM | POA: Diagnosis not present

## 2021-08-15 DIAGNOSIS — I130 Hypertensive heart and chronic kidney disease with heart failure and stage 1 through stage 4 chronic kidney disease, or unspecified chronic kidney disease: Secondary | ICD-10-CM | POA: Diagnosis not present

## 2021-08-15 DIAGNOSIS — F32A Depression, unspecified: Secondary | ICD-10-CM | POA: Diagnosis not present

## 2021-08-16 DIAGNOSIS — I5033 Acute on chronic diastolic (congestive) heart failure: Secondary | ICD-10-CM | POA: Diagnosis not present

## 2021-08-16 DIAGNOSIS — D631 Anemia in chronic kidney disease: Secondary | ICD-10-CM | POA: Diagnosis not present

## 2021-08-16 DIAGNOSIS — E1122 Type 2 diabetes mellitus with diabetic chronic kidney disease: Secondary | ICD-10-CM | POA: Diagnosis not present

## 2021-08-16 DIAGNOSIS — N183 Chronic kidney disease, stage 3 unspecified: Secondary | ICD-10-CM | POA: Diagnosis not present

## 2021-08-16 DIAGNOSIS — I130 Hypertensive heart and chronic kidney disease with heart failure and stage 1 through stage 4 chronic kidney disease, or unspecified chronic kidney disease: Secondary | ICD-10-CM | POA: Diagnosis not present

## 2021-08-16 DIAGNOSIS — I5020 Unspecified systolic (congestive) heart failure: Secondary | ICD-10-CM | POA: Diagnosis not present

## 2021-08-16 NOTE — Telephone Encounter (Signed)
From patient.

## 2021-08-17 DIAGNOSIS — I130 Hypertensive heart and chronic kidney disease with heart failure and stage 1 through stage 4 chronic kidney disease, or unspecified chronic kidney disease: Secondary | ICD-10-CM | POA: Diagnosis not present

## 2021-08-17 DIAGNOSIS — D631 Anemia in chronic kidney disease: Secondary | ICD-10-CM | POA: Diagnosis not present

## 2021-08-17 DIAGNOSIS — N183 Chronic kidney disease, stage 3 unspecified: Secondary | ICD-10-CM | POA: Diagnosis not present

## 2021-08-17 DIAGNOSIS — E1122 Type 2 diabetes mellitus with diabetic chronic kidney disease: Secondary | ICD-10-CM | POA: Diagnosis not present

## 2021-08-17 DIAGNOSIS — I5020 Unspecified systolic (congestive) heart failure: Secondary | ICD-10-CM | POA: Diagnosis not present

## 2021-08-17 DIAGNOSIS — I5033 Acute on chronic diastolic (congestive) heart failure: Secondary | ICD-10-CM | POA: Diagnosis not present

## 2021-08-25 DIAGNOSIS — R350 Frequency of micturition: Secondary | ICD-10-CM | POA: Diagnosis not present

## 2021-08-26 DIAGNOSIS — D631 Anemia in chronic kidney disease: Secondary | ICD-10-CM | POA: Diagnosis not present

## 2021-08-26 DIAGNOSIS — N183 Chronic kidney disease, stage 3 unspecified: Secondary | ICD-10-CM | POA: Diagnosis not present

## 2021-08-26 DIAGNOSIS — I5033 Acute on chronic diastolic (congestive) heart failure: Secondary | ICD-10-CM | POA: Diagnosis not present

## 2021-08-26 DIAGNOSIS — I130 Hypertensive heart and chronic kidney disease with heart failure and stage 1 through stage 4 chronic kidney disease, or unspecified chronic kidney disease: Secondary | ICD-10-CM | POA: Diagnosis not present

## 2021-08-26 DIAGNOSIS — E1122 Type 2 diabetes mellitus with diabetic chronic kidney disease: Secondary | ICD-10-CM | POA: Diagnosis not present

## 2021-08-26 DIAGNOSIS — I5020 Unspecified systolic (congestive) heart failure: Secondary | ICD-10-CM | POA: Diagnosis not present

## 2021-09-02 DIAGNOSIS — N390 Urinary tract infection, site not specified: Secondary | ICD-10-CM | POA: Diagnosis not present

## 2021-09-02 DIAGNOSIS — I4891 Unspecified atrial fibrillation: Secondary | ICD-10-CM | POA: Diagnosis not present

## 2021-09-02 DIAGNOSIS — N1831 Chronic kidney disease, stage 3a: Secondary | ICD-10-CM | POA: Diagnosis not present

## 2021-09-02 DIAGNOSIS — R410 Disorientation, unspecified: Secondary | ICD-10-CM | POA: Diagnosis not present

## 2021-09-02 DIAGNOSIS — Z7901 Long term (current) use of anticoagulants: Secondary | ICD-10-CM | POA: Diagnosis not present

## 2021-09-02 DIAGNOSIS — N3281 Overactive bladder: Secondary | ICD-10-CM | POA: Diagnosis not present

## 2021-09-02 DIAGNOSIS — R627 Adult failure to thrive: Secondary | ICD-10-CM | POA: Diagnosis not present

## 2021-09-02 DIAGNOSIS — E1149 Type 2 diabetes mellitus with other diabetic neurological complication: Secondary | ICD-10-CM | POA: Diagnosis not present

## 2021-09-02 DIAGNOSIS — R4189 Other symptoms and signs involving cognitive functions and awareness: Secondary | ICD-10-CM | POA: Diagnosis not present

## 2021-09-02 DIAGNOSIS — R399 Unspecified symptoms and signs involving the genitourinary system: Secondary | ICD-10-CM | POA: Diagnosis not present

## 2021-09-02 DIAGNOSIS — S51012A Laceration without foreign body of left elbow, initial encounter: Secondary | ICD-10-CM | POA: Diagnosis not present

## 2021-09-03 ENCOUNTER — Inpatient Hospital Stay (HOSPITAL_BASED_OUTPATIENT_CLINIC_OR_DEPARTMENT_OTHER)
Admission: EM | Admit: 2021-09-03 | Discharge: 2021-10-10 | DRG: 871 | Disposition: E | Payer: Medicare Other | Attending: Internal Medicine | Admitting: Internal Medicine

## 2021-09-03 ENCOUNTER — Other Ambulatory Visit: Payer: Self-pay

## 2021-09-03 ENCOUNTER — Encounter (HOSPITAL_COMMUNITY): Payer: Self-pay

## 2021-09-03 ENCOUNTER — Encounter (HOSPITAL_BASED_OUTPATIENT_CLINIC_OR_DEPARTMENT_OTHER): Payer: Self-pay | Admitting: Emergency Medicine

## 2021-09-03 ENCOUNTER — Emergency Department (HOSPITAL_BASED_OUTPATIENT_CLINIC_OR_DEPARTMENT_OTHER): Payer: Medicare Other

## 2021-09-03 ENCOUNTER — Emergency Department (HOSPITAL_BASED_OUTPATIENT_CLINIC_OR_DEPARTMENT_OTHER): Payer: Medicare Other | Admitting: Radiology

## 2021-09-03 DIAGNOSIS — Y92239 Unspecified place in hospital as the place of occurrence of the external cause: Secondary | ICD-10-CM | POA: Diagnosis not present

## 2021-09-03 DIAGNOSIS — Z4682 Encounter for fitting and adjustment of non-vascular catheter: Secondary | ICD-10-CM | POA: Diagnosis not present

## 2021-09-03 DIAGNOSIS — J9621 Acute and chronic respiratory failure with hypoxia: Secondary | ICD-10-CM | POA: Diagnosis present

## 2021-09-03 DIAGNOSIS — I5042 Chronic combined systolic (congestive) and diastolic (congestive) heart failure: Secondary | ICD-10-CM

## 2021-09-03 DIAGNOSIS — I252 Old myocardial infarction: Secondary | ICD-10-CM

## 2021-09-03 DIAGNOSIS — E669 Obesity, unspecified: Secondary | ICD-10-CM | POA: Diagnosis present

## 2021-09-03 DIAGNOSIS — I2510 Atherosclerotic heart disease of native coronary artery without angina pectoris: Secondary | ICD-10-CM | POA: Diagnosis present

## 2021-09-03 DIAGNOSIS — Z20822 Contact with and (suspected) exposure to covid-19: Secondary | ICD-10-CM | POA: Diagnosis present

## 2021-09-03 DIAGNOSIS — T462X5A Adverse effect of other antidysrhythmic drugs, initial encounter: Secondary | ICD-10-CM | POA: Diagnosis not present

## 2021-09-03 DIAGNOSIS — Z9109 Other allergy status, other than to drugs and biological substances: Secondary | ICD-10-CM

## 2021-09-03 DIAGNOSIS — A419 Sepsis, unspecified organism: Principal | ICD-10-CM

## 2021-09-03 DIAGNOSIS — I130 Hypertensive heart and chronic kidney disease with heart failure and stage 1 through stage 4 chronic kidney disease, or unspecified chronic kidney disease: Secondary | ICD-10-CM | POA: Diagnosis present

## 2021-09-03 DIAGNOSIS — E1122 Type 2 diabetes mellitus with diabetic chronic kidney disease: Secondary | ICD-10-CM | POA: Diagnosis not present

## 2021-09-03 DIAGNOSIS — D631 Anemia in chronic kidney disease: Secondary | ICD-10-CM | POA: Diagnosis present

## 2021-09-03 DIAGNOSIS — Z4509 Encounter for adjustment and management of other cardiac device: Secondary | ICD-10-CM | POA: Diagnosis not present

## 2021-09-03 DIAGNOSIS — Z951 Presence of aortocoronary bypass graft: Secondary | ICD-10-CM

## 2021-09-03 DIAGNOSIS — Z87891 Personal history of nicotine dependence: Secondary | ICD-10-CM

## 2021-09-03 DIAGNOSIS — Z6830 Body mass index (BMI) 30.0-30.9, adult: Secondary | ICD-10-CM

## 2021-09-03 DIAGNOSIS — F419 Anxiety disorder, unspecified: Secondary | ICD-10-CM | POA: Diagnosis present

## 2021-09-03 DIAGNOSIS — Z9981 Dependence on supplemental oxygen: Secondary | ICD-10-CM

## 2021-09-03 DIAGNOSIS — R0602 Shortness of breath: Secondary | ICD-10-CM | POA: Diagnosis not present

## 2021-09-03 DIAGNOSIS — I255 Ischemic cardiomyopathy: Secondary | ICD-10-CM | POA: Diagnosis not present

## 2021-09-03 DIAGNOSIS — Z807 Family history of other malignant neoplasms of lymphoid, hematopoietic and related tissues: Secondary | ICD-10-CM

## 2021-09-03 DIAGNOSIS — Z90710 Acquired absence of both cervix and uterus: Secondary | ICD-10-CM

## 2021-09-03 DIAGNOSIS — J9690 Respiratory failure, unspecified, unspecified whether with hypoxia or hypercapnia: Secondary | ICD-10-CM | POA: Diagnosis not present

## 2021-09-03 DIAGNOSIS — R1312 Dysphagia, oropharyngeal phase: Secondary | ICD-10-CM | POA: Diagnosis present

## 2021-09-03 DIAGNOSIS — N179 Acute kidney failure, unspecified: Secondary | ICD-10-CM | POA: Diagnosis present

## 2021-09-03 DIAGNOSIS — J189 Pneumonia, unspecified organism: Secondary | ICD-10-CM | POA: Diagnosis present

## 2021-09-03 DIAGNOSIS — I453 Trifascicular block: Secondary | ICD-10-CM | POA: Diagnosis present

## 2021-09-03 DIAGNOSIS — Z8249 Family history of ischemic heart disease and other diseases of the circulatory system: Secondary | ICD-10-CM

## 2021-09-03 DIAGNOSIS — J8410 Pulmonary fibrosis, unspecified: Secondary | ICD-10-CM | POA: Diagnosis not present

## 2021-09-03 DIAGNOSIS — J9601 Acute respiratory failure with hypoxia: Secondary | ICD-10-CM

## 2021-09-03 DIAGNOSIS — R0902 Hypoxemia: Secondary | ICD-10-CM | POA: Diagnosis not present

## 2021-09-03 DIAGNOSIS — Z8673 Personal history of transient ischemic attack (TIA), and cerebral infarction without residual deficits: Secondary | ICD-10-CM | POA: Diagnosis not present

## 2021-09-03 DIAGNOSIS — I480 Paroxysmal atrial fibrillation: Secondary | ICD-10-CM | POA: Diagnosis present

## 2021-09-03 DIAGNOSIS — E1142 Type 2 diabetes mellitus with diabetic polyneuropathy: Secondary | ICD-10-CM | POA: Diagnosis not present

## 2021-09-03 DIAGNOSIS — F05 Delirium due to known physiological condition: Secondary | ICD-10-CM | POA: Diagnosis present

## 2021-09-03 DIAGNOSIS — Z91040 Latex allergy status: Secondary | ICD-10-CM

## 2021-09-03 DIAGNOSIS — T462X1A Poisoning by other antidysrhythmic drugs, accidental (unintentional), initial encounter: Secondary | ICD-10-CM | POA: Diagnosis present

## 2021-09-03 DIAGNOSIS — B9620 Unspecified Escherichia coli [E. coli] as the cause of diseases classified elsewhere: Secondary | ICD-10-CM

## 2021-09-03 DIAGNOSIS — Y92009 Unspecified place in unspecified non-institutional (private) residence as the place of occurrence of the external cause: Secondary | ICD-10-CM | POA: Diagnosis not present

## 2021-09-03 DIAGNOSIS — K219 Gastro-esophageal reflux disease without esophagitis: Secondary | ICD-10-CM | POA: Diagnosis present

## 2021-09-03 DIAGNOSIS — Z955 Presence of coronary angioplasty implant and graft: Secondary | ICD-10-CM

## 2021-09-03 DIAGNOSIS — I5023 Acute on chronic systolic (congestive) heart failure: Secondary | ICD-10-CM | POA: Diagnosis not present

## 2021-09-03 DIAGNOSIS — I6381 Other cerebral infarction due to occlusion or stenosis of small artery: Secondary | ICD-10-CM | POA: Diagnosis not present

## 2021-09-03 DIAGNOSIS — E871 Hypo-osmolality and hyponatremia: Secondary | ICD-10-CM | POA: Diagnosis present

## 2021-09-03 DIAGNOSIS — I5043 Acute on chronic combined systolic (congestive) and diastolic (congestive) heart failure: Secondary | ICD-10-CM | POA: Diagnosis present

## 2021-09-03 DIAGNOSIS — Z7901 Long term (current) use of anticoagulants: Secondary | ICD-10-CM

## 2021-09-03 DIAGNOSIS — A4151 Sepsis due to Escherichia coli [E. coli]: Principal | ICD-10-CM | POA: Diagnosis present

## 2021-09-03 DIAGNOSIS — R0603 Acute respiratory distress: Secondary | ICD-10-CM | POA: Diagnosis not present

## 2021-09-03 DIAGNOSIS — Z515 Encounter for palliative care: Secondary | ICD-10-CM

## 2021-09-03 DIAGNOSIS — D72829 Elevated white blood cell count, unspecified: Secondary | ICD-10-CM | POA: Diagnosis present

## 2021-09-03 DIAGNOSIS — H9190 Unspecified hearing loss, unspecified ear: Secondary | ICD-10-CM | POA: Diagnosis present

## 2021-09-03 DIAGNOSIS — N189 Chronic kidney disease, unspecified: Secondary | ICD-10-CM | POA: Diagnosis not present

## 2021-09-03 DIAGNOSIS — Z79899 Other long term (current) drug therapy: Secondary | ICD-10-CM

## 2021-09-03 DIAGNOSIS — I6340 Cerebral infarction due to embolism of unspecified cerebral artery: Secondary | ICD-10-CM | POA: Diagnosis not present

## 2021-09-03 DIAGNOSIS — I10 Essential (primary) hypertension: Secondary | ICD-10-CM | POA: Diagnosis present

## 2021-09-03 DIAGNOSIS — I25110 Atherosclerotic heart disease of native coronary artery with unstable angina pectoris: Secondary | ICD-10-CM | POA: Diagnosis present

## 2021-09-03 DIAGNOSIS — Z9049 Acquired absence of other specified parts of digestive tract: Secondary | ICD-10-CM

## 2021-09-03 DIAGNOSIS — R410 Disorientation, unspecified: Secondary | ICD-10-CM | POA: Diagnosis not present

## 2021-09-03 DIAGNOSIS — I700 Atherosclerosis of aorta: Secondary | ICD-10-CM | POA: Diagnosis not present

## 2021-09-03 DIAGNOSIS — N184 Chronic kidney disease, stage 4 (severe): Secondary | ICD-10-CM | POA: Diagnosis present

## 2021-09-03 DIAGNOSIS — Z7902 Long term (current) use of antithrombotics/antiplatelets: Secondary | ICD-10-CM

## 2021-09-03 DIAGNOSIS — R791 Abnormal coagulation profile: Secondary | ICD-10-CM | POA: Diagnosis present

## 2021-09-03 DIAGNOSIS — D649 Anemia, unspecified: Secondary | ICD-10-CM | POA: Diagnosis not present

## 2021-09-03 DIAGNOSIS — E1151 Type 2 diabetes mellitus with diabetic peripheral angiopathy without gangrene: Secondary | ICD-10-CM | POA: Diagnosis present

## 2021-09-03 DIAGNOSIS — I440 Atrioventricular block, first degree: Secondary | ICD-10-CM | POA: Diagnosis present

## 2021-09-03 DIAGNOSIS — Z1611 Resistance to penicillins: Secondary | ICD-10-CM | POA: Diagnosis not present

## 2021-09-03 DIAGNOSIS — E1169 Type 2 diabetes mellitus with other specified complication: Secondary | ICD-10-CM | POA: Diagnosis present

## 2021-09-03 DIAGNOSIS — Z888 Allergy status to other drugs, medicaments and biological substances status: Secondary | ICD-10-CM

## 2021-09-03 DIAGNOSIS — Z885 Allergy status to narcotic agent status: Secondary | ICD-10-CM

## 2021-09-03 DIAGNOSIS — J159 Unspecified bacterial pneumonia: Secondary | ICD-10-CM | POA: Diagnosis present

## 2021-09-03 DIAGNOSIS — F0390 Unspecified dementia without behavioral disturbance: Secondary | ICD-10-CM | POA: Diagnosis present

## 2021-09-03 DIAGNOSIS — E1165 Type 2 diabetes mellitus with hyperglycemia: Secondary | ICD-10-CM | POA: Diagnosis not present

## 2021-09-03 DIAGNOSIS — R6520 Severe sepsis without septic shock: Secondary | ICD-10-CM

## 2021-09-03 DIAGNOSIS — G4733 Obstructive sleep apnea (adult) (pediatric): Secondary | ICD-10-CM

## 2021-09-03 DIAGNOSIS — R7989 Other specified abnormal findings of blood chemistry: Secondary | ICD-10-CM | POA: Diagnosis present

## 2021-09-03 DIAGNOSIS — R7881 Bacteremia: Secondary | ICD-10-CM

## 2021-09-03 DIAGNOSIS — M8580 Other specified disorders of bone density and structure, unspecified site: Secondary | ICD-10-CM | POA: Diagnosis present

## 2021-09-03 DIAGNOSIS — E876 Hypokalemia: Secondary | ICD-10-CM | POA: Diagnosis present

## 2021-09-03 DIAGNOSIS — H547 Unspecified visual loss: Secondary | ICD-10-CM | POA: Diagnosis present

## 2021-09-03 DIAGNOSIS — Z7984 Long term (current) use of oral hypoglycemic drugs: Secondary | ICD-10-CM

## 2021-09-03 DIAGNOSIS — G928 Other toxic encephalopathy: Secondary | ICD-10-CM | POA: Diagnosis present

## 2021-09-03 DIAGNOSIS — I6523 Occlusion and stenosis of bilateral carotid arteries: Secondary | ICD-10-CM | POA: Diagnosis present

## 2021-09-03 DIAGNOSIS — J188 Other pneumonia, unspecified organism: Secondary | ICD-10-CM | POA: Diagnosis present

## 2021-09-03 DIAGNOSIS — W06XXXA Fall from bed, initial encounter: Secondary | ICD-10-CM | POA: Diagnosis present

## 2021-09-03 DIAGNOSIS — Z95818 Presence of other cardiac implants and grafts: Secondary | ICD-10-CM | POA: Diagnosis not present

## 2021-09-03 DIAGNOSIS — J984 Other disorders of lung: Secondary | ICD-10-CM | POA: Diagnosis not present

## 2021-09-03 DIAGNOSIS — R918 Other nonspecific abnormal finding of lung field: Secondary | ICD-10-CM | POA: Diagnosis not present

## 2021-09-03 DIAGNOSIS — Z9989 Dependence on other enabling machines and devices: Secondary | ICD-10-CM

## 2021-09-03 DIAGNOSIS — Z66 Do not resuscitate: Secondary | ICD-10-CM | POA: Diagnosis not present

## 2021-09-03 DIAGNOSIS — R4182 Altered mental status, unspecified: Secondary | ICD-10-CM | POA: Diagnosis not present

## 2021-09-03 DIAGNOSIS — F32A Depression, unspecified: Secondary | ICD-10-CM | POA: Diagnosis present

## 2021-09-03 DIAGNOSIS — Z713 Dietary counseling and surveillance: Secondary | ICD-10-CM

## 2021-09-03 DIAGNOSIS — G9340 Encephalopathy, unspecified: Secondary | ICD-10-CM

## 2021-09-03 DIAGNOSIS — T380X5A Adverse effect of glucocorticoids and synthetic analogues, initial encounter: Secondary | ICD-10-CM | POA: Diagnosis not present

## 2021-09-03 DIAGNOSIS — E041 Nontoxic single thyroid nodule: Secondary | ICD-10-CM

## 2021-09-03 DIAGNOSIS — J80 Acute respiratory distress syndrome: Secondary | ICD-10-CM

## 2021-09-03 DIAGNOSIS — E785 Hyperlipidemia, unspecified: Secondary | ICD-10-CM | POA: Diagnosis present

## 2021-09-03 LAB — URINALYSIS, ROUTINE W REFLEX MICROSCOPIC
Bilirubin Urine: NEGATIVE
Glucose, UA: >1000 mg/dL — AB
Hgb urine dipstick: NEGATIVE
Ketones, ur: NEGATIVE mg/dL
Leukocytes,Ua: NEGATIVE
Nitrite: NEGATIVE
Protein, ur: 30 mg/dL — AB
Specific Gravity, Urine: 1.012 (ref 1.005–1.030)
pH: 5 (ref 5.0–8.0)

## 2021-09-03 LAB — COMPREHENSIVE METABOLIC PANEL
ALT: 32 U/L (ref 0–44)
AST: 52 U/L — ABNORMAL HIGH (ref 15–41)
Albumin: 3.3 g/dL — ABNORMAL LOW (ref 3.5–5.0)
Alkaline Phosphatase: 102 U/L (ref 38–126)
Anion gap: 10 (ref 5–15)
BUN: 32 mg/dL — ABNORMAL HIGH (ref 8–23)
CO2: 27 mmol/L (ref 22–32)
Calcium: 9 mg/dL (ref 8.9–10.3)
Chloride: 103 mmol/L (ref 98–111)
Creatinine, Ser: 2.02 mg/dL — ABNORMAL HIGH (ref 0.44–1.00)
GFR, Estimated: 24 mL/min — ABNORMAL LOW (ref >60)
Glucose, Bld: 149 mg/dL — ABNORMAL HIGH (ref 70–99)
Potassium: 4.2 mmol/L (ref 3.5–5.1)
Sodium: 140 mmol/L (ref 135–145)
Total Bilirubin: 0.9 mg/dL (ref 0.3–1.2)
Total Protein: 6.6 g/dL (ref 6.5–8.1)

## 2021-09-03 LAB — CBC
HCT: 25.6 % — ABNORMAL LOW (ref 36.0–46.0)
Hemoglobin: 7.8 g/dL — ABNORMAL LOW (ref 12.0–15.0)
MCH: 26.1 pg (ref 26.0–34.0)
MCHC: 30.5 g/dL (ref 30.0–36.0)
MCV: 85.6 fL (ref 80.0–100.0)
Platelets: 312 10*3/uL (ref 150–400)
RBC: 2.99 MIL/uL — ABNORMAL LOW (ref 3.87–5.11)
RDW: 17.2 % — ABNORMAL HIGH (ref 11.5–15.5)
WBC: 12.3 10*3/uL — ABNORMAL HIGH (ref 4.0–10.5)
nRBC: 0 % (ref 0.0–0.2)

## 2021-09-03 LAB — LACTIC ACID, PLASMA
Lactic Acid, Venous: 1.4 mmol/L (ref 0.5–1.9)
Lactic Acid, Venous: 2.3 mmol/L (ref 0.5–1.9)
Lactic Acid, Venous: 2.2 mmol/L (ref 0.5–1.9)

## 2021-09-03 LAB — RESP PANEL BY RT-PCR (FLU A&B, COVID) ARPGX2
Influenza A by PCR: NEGATIVE
Influenza B by PCR: NEGATIVE
SARS Coronavirus 2 by RT PCR: NEGATIVE

## 2021-09-03 LAB — PROTIME-INR
INR: 3.5 — ABNORMAL HIGH (ref 0.8–1.2)
Prothrombin Time: 35.1 seconds — ABNORMAL HIGH (ref 11.4–15.2)

## 2021-09-03 LAB — BRAIN NATRIURETIC PEPTIDE: B Natriuretic Peptide: 417.3 pg/mL — ABNORMAL HIGH (ref 0.0–100.0)

## 2021-09-03 LAB — OCCULT BLOOD X 1 CARD TO LAB, STOOL: Fecal Occult Bld: NEGATIVE

## 2021-09-03 MED ORDER — ACETAMINOPHEN 650 MG RE SUPP
325.0000 mg | RECTAL | Status: DC | PRN
  Administered 2021-09-03: 325 mg via RECTAL
  Filled 2021-09-03: qty 1

## 2021-09-03 MED ORDER — PANTOPRAZOLE SODIUM 40 MG PO TBEC
40.0000 mg | DELAYED_RELEASE_TABLET | Freq: Every day | ORAL | Status: DC
  Administered 2021-09-04 – 2021-09-13 (×10): 40 mg via ORAL
  Filled 2021-09-03 (×11): qty 1

## 2021-09-03 MED ORDER — FUROSEMIDE 10 MG/ML IJ SOLN
40.0000 mg | Freq: Once | INTRAMUSCULAR | Status: AC
  Administered 2021-09-04: 40 mg via INTRAVENOUS
  Filled 2021-09-03: qty 4

## 2021-09-03 MED ORDER — POLYETHYLENE GLYCOL 3350 17 G PO PACK: 17.0000 g | PACK | Freq: Every day | ORAL | Status: DC | PRN

## 2021-09-03 MED ORDER — MEMANTINE HCL ER 28 MG PO CP24
28.0000 mg | ORAL_CAPSULE | Freq: Every day | ORAL | Status: DC
  Administered 2021-09-04 – 2021-09-13 (×11): 28 mg via ORAL
  Filled 2021-09-03 (×13): qty 1

## 2021-09-03 MED ORDER — ISOSORBIDE DINITRATE 20 MG PO TABS
30.0000 mg | ORAL_TABLET | Freq: Two times a day (BID) | ORAL | Status: DC
  Administered 2021-09-04 – 2021-09-13 (×20): 30 mg via ORAL
  Filled 2021-09-03 (×24): qty 1

## 2021-09-03 MED ORDER — SODIUM CHLORIDE 0.9 % IV BOLUS
500.0000 mL | Freq: Once | INTRAVENOUS | Status: AC
  Administered 2021-09-03: 500 mL via INTRAVENOUS

## 2021-09-03 MED ORDER — ACETAMINOPHEN 650 MG RE SUPP: 650.0000 mg | Freq: Four times a day (QID) | RECTAL | Status: DC | PRN

## 2021-09-03 MED ORDER — MEMANTINE HCL-DONEPEZIL HCL 28-10 MG PO CP24
1.0000 | ORAL_CAPSULE | Freq: Every day | ORAL | Status: DC
Start: 2021-09-04 — End: 2021-09-03

## 2021-09-03 MED ORDER — SODIUM CHLORIDE 0.9% FLUSH
3.0000 mL | Freq: Two times a day (BID) | INTRAVENOUS | Status: DC
  Administered 2021-09-04 – 2021-09-16 (×25): 3 mL via INTRAVENOUS

## 2021-09-03 MED ORDER — DONEPEZIL HCL 10 MG PO TABS
10.0000 mg | ORAL_TABLET | Freq: Every day | ORAL | Status: DC
  Administered 2021-09-04 – 2021-09-13 (×11): 10 mg via ORAL
  Filled 2021-09-03 (×12): qty 1

## 2021-09-03 MED ORDER — FUROSEMIDE 10 MG/ML IJ SOLN: 40.0000 mg | Freq: Once | INTRAMUSCULAR | Status: DC

## 2021-09-03 MED ORDER — DULOXETINE HCL 30 MG PO CPEP
60.0000 mg | ORAL_CAPSULE | Freq: Every day | ORAL | Status: DC
  Administered 2021-09-04 – 2021-09-13 (×10): 60 mg via ORAL
  Filled 2021-09-03: qty 2
  Filled 2021-09-03 (×2): qty 1
  Filled 2021-09-03 (×2): qty 2
  Filled 2021-09-03 (×2): qty 1
  Filled 2021-09-03: qty 2
  Filled 2021-09-03: qty 1
  Filled 2021-09-03: qty 2
  Filled 2021-09-03: qty 1

## 2021-09-03 MED ORDER — ROSUVASTATIN CALCIUM 10 MG PO TABS
10.0000 mg | ORAL_TABLET | Freq: Every day | ORAL | Status: DC
  Administered 2021-09-04 – 2021-09-13 (×9): 10 mg via ORAL
  Filled 2021-09-03 (×10): qty 1

## 2021-09-03 MED ORDER — METOPROLOL SUCCINATE ER 25 MG PO TB24
12.5000 mg | ORAL_TABLET | Freq: Every day | ORAL | Status: DC
  Administered 2021-09-04 – 2021-09-13 (×10): 12.5 mg via ORAL
  Filled 2021-09-03 (×11): qty 1

## 2021-09-03 MED ORDER — CLOPIDOGREL BISULFATE 75 MG PO TABS
75.0000 mg | ORAL_TABLET | Freq: Every day | ORAL | Status: DC
  Administered 2021-09-04 – 2021-09-13 (×10): 75 mg via ORAL
  Filled 2021-09-03 (×11): qty 1

## 2021-09-03 MED ORDER — GABAPENTIN 300 MG PO CAPS
300.0000 mg | ORAL_CAPSULE | Freq: Two times a day (BID) | ORAL | Status: DC
  Administered 2021-09-04 – 2021-09-13 (×20): 300 mg via ORAL
  Filled 2021-09-03 (×20): qty 1

## 2021-09-03 MED ORDER — SODIUM CHLORIDE 0.9 % IV SOLN
500.0000 mg | INTRAVENOUS | Status: DC
  Administered 2021-09-03: 500 mg via INTRAVENOUS
  Filled 2021-09-03 (×2): qty 5

## 2021-09-03 MED ORDER — SODIUM CHLORIDE 0.9 % IV SOLN
2.0000 g | INTRAVENOUS | Status: DC
  Administered 2021-09-03: 2 g via INTRAVENOUS
  Filled 2021-09-03: qty 20

## 2021-09-03 MED ORDER — AMIODARONE HCL 200 MG PO TABS
200.0000 mg | ORAL_TABLET | Freq: Every day | ORAL | Status: DC
  Administered 2021-09-04 – 2021-09-09 (×6): 200 mg via ORAL
  Filled 2021-09-03 (×6): qty 1

## 2021-09-03 MED ORDER — EZETIMIBE 10 MG PO TABS
10.0000 mg | ORAL_TABLET | Freq: Every day | ORAL | Status: DC
  Administered 2021-09-04 – 2021-09-13 (×10): 10 mg via ORAL
  Filled 2021-09-03 (×10): qty 1

## 2021-09-03 MED ORDER — ACETAMINOPHEN 325 MG PO TABS: 650.0000 mg | ORAL_TABLET | Freq: Four times a day (QID) | ORAL | Status: DC | PRN

## 2021-09-03 NOTE — ED Triage Notes (Signed)
Pt arrives to ED with c/o altered mental status. Family reports her symptoms started on 8/20 and have been progressing. Family reports onset of delirium, was dx with this yesterday. Was seen yesterday by PCP, they were unable to obtain urine sample but did start he ron Cephalexin '500mg'$  for possible UTI.

## 2021-09-03 NOTE — Sepsis Progress Note (Signed)
eLink is following this Code Sepsis. °

## 2021-09-03 NOTE — Sepsis Progress Note (Signed)
Notified provider of need to order another repeat lactic acid, as the seconde was higher than the first.

## 2021-09-03 NOTE — ED Provider Notes (Signed)
[facility] EMERGENCY DEPT Provider Note   CSN: [id number] Arrival date & time: 08/15/2021  1219     History  Chief Complaint  Patient presents with   Altered Mental Status    Linda Oliver is a 88 y.o. female.  Patient is a 88 year old female who presents with weakness and altered mental status.  History is obtained from the daughter and chart review.  Patient has a history of dementia, CHF, paroxysmal atrial fibrillation on Eliquis, coronary artery disease status post stent placement, chronic kidney disease, diabetes.  She lives at home with her daughter who is at bedside.  Her daughter states over the last 5 days she has had a decline.  She is talking to people who are not there and mumbling.  She is not as communicative as normal.  She has been generally weak.  She slid off the bed a couple times because she has not been able to get up and walk.  She has had a decreased oral intake and decreased urination.  She went to her PCP yesterday and they were not able to get blood or urine sample but was started on Keflex for possible UTI.       Home Medications Prior to Admission medications   Medication Sig Start Date End Date Taking? Authorizing Provider  acetaminophen (TYLENOL) 500 MG tablet Take 1,000 mg by mouth every 4 (four) hours as needed for moderate pain or headache.    [provider]  amiodarone (PACERONE) 200 MG tablet Take 1 tablet (200 mg total) by mouth daily. 07/28/21 08/27/21  Cantwell, Celeste C, PA-C  apixaban (ELIQUIS) 5 MG TABS tablet Take 1 tablet (5 mg total) by mouth 2 (two) times daily. Will need to see MD for future refills. 08/22/19   Camnitz, Ocie Doyne, MD  B-D UF III MINI PEN NEEDLES 31G X 5 MM MISC SMARTSIG:1 Each SUB-Q Daily 03/14/19   [provider]  clopidogrel (PLAVIX) 75 MG tablet Take 1 tablet (75 mg total) by mouth daily. 03/11/21   Cantwell, Celeste C, PA-C  DULoxetine (CYMBALTA) 60 MG capsule Take 60 mg by mouth daily.  10/16/17   [provider]  ezetimibe (ZETIA) 10 MG tablet Take 10 mg by mouth daily. 09/17/19   [provider]  Ferrous Sulfate Dried (HIGH POTENCY IRON) 65 MG TABS Take 65 mg by mouth daily.    [provider]  fesoterodine (TOVIAZ) 8 MG TB24 tablet Take 8 mg by mouth every evening.     [provider]  fluticasone (FLONASE) 50 MCG/ACT nasal spray Place 1 spray into both nostrils 2 (two) times daily as needed for allergies or rhinitis.    [provider]  FREESTYLE LITE test strip Check blood sugar every morning 04/09/19   [provider]  furosemide (LASIX) 40 MG tablet Take 1 tablet (40 mg total) by mouth daily. 07/14/21 08/13/21  Arrien, Jimmy Picket, MD  gabapentin (NEURONTIN) 300 MG capsule Take 300 mg by mouth 2 (two) times daily.    [provider]  hydrOXYzine (ATARAX/VISTARIL) 25 MG tablet Take 25-50 mg by mouth 3 (three) times daily. 25 mg in the morning 50 mg at bedtime    [provider]  Insulin Glargine (LANTUS SOLOSTAR) 100 UNIT/ML Solostar Pen Inject 25 Units into the skin daily before breakfast.    [provider]  isosorbide dinitrate (ISORDIL) 30 MG tablet TAKE 1 TABLET THREE TIMES A DAY Patient taking differently: Take 30 mg by mouth 2 (two) times  daily. 07/06/21   Adrian Prows, MD  loperamide (IMODIUM A-D) 2 MG tablet Take 2 mg by mouth 4 (four) times daily as needed for diarrhea or loose stools.    [provider]  metoprolol succinate (TOPROL-XL) 25 MG 24 hr tablet Take 12.5 mg by mouth 2 (two) times daily.    [provider]  NAMZARIC 28-10 MG CP24 Take 1 capsule by mouth daily. Patient taking differently: Take 1 capsule by mouth at bedtime. 06/09/21   Melvenia Beam, MD  nitroGLYCERIN (NITROSTAT) 0.4 MG SL tablet Place 1 tablet (0.4 mg total) under the tongue every 5 (five) minutes as needed for up to 25 days for chest pain. Patient taking differently: Place 0.4 mg under the  tongue every 5 (five) minutes as needed for chest pain. 05/29/20 07/07/21  Adrian Prows, MD  NON FORMULARY CPAP at bedtime    [provider]  pantoprazole (PROTONIX) 40 MG tablet Take 40 mg by mouth daily as needed (heartburn). 10/31/17   [provider]  potassium chloride SA (KLOR-CON M) 20 MEQ tablet TAKE 1 TABLET DAILY Patient taking differently: Take 20 mEq by mouth daily. 01/29/21   Adrian Prows, MD  rosuvastatin (CRESTOR) 10 MG tablet Take 10 mg by mouth daily before supper.    [provider]  sitaGLIPtin (JANUVIA) 100 MG tablet Take 100 mg by mouth daily.    [provider]  Vibegron (GEMTESA) 75 MG TABS Take 75 mg by mouth daily.    [provider]  vitamin B-12 (CYANOCOBALAMIN) 1000 MCG tablet Take 1,000 mcg by mouth at bedtime.    [provider]      Allergies    Codeine, Metformin, Oxycodone-acetaminophen, Percocet [oxycodone-acetaminophen], Tape, Tolterodine, and Latex    Review of Systems   Review of Systems  Unable to perform ROS: Mental status change    Physical Exam Updated Vital Signs BP 128/71 (BP Location: Right Arm)   Pulse 61   Temp (!) 100.5 F (38.1 C) (Rectal)   Resp (!) 23   Ht 5' (1.524 m)   Wt 71.6 kg   SpO2 93%   BMI 30.83 kg/m  Physical Exam Constitutional:      Appearance: She is well-developed.  HENT:     Head: Normocephalic and atraumatic.  Eyes:     Pupils: Pupils are equal, round, and reactive to light.  Cardiovascular:     Rate and Rhythm: Normal rate and regular rhythm.     Heart sounds: Normal heart sounds.  Pulmonary:     Effort: Pulmonary effort is normal. No respiratory distress.     Breath sounds: Normal breath sounds. No wheezing or rales.  Chest:     Chest wall: No tenderness.  Abdominal:     General: Bowel sounds are normal.     Palpations: Abdomen is soft.     Tenderness: There is no abdominal tenderness. There is no guarding or rebound.  Musculoskeletal:        General:  Normal range of motion.     Cervical back: Normal range of motion and neck supple.     Comments: Ecchymosis to her lower extremities.  No pain on palpation or range of motion extremities.  There is a healing skin tear to her right forearm without underlying bony tenderness.  Lymphadenopathy:     Cervical: No cervical adenopathy.  Skin:    General: Skin is warm and dry.     Findings: No rash.  Neurological:     General:  No focal deficit present.     Mental Status: She is alert.     Comments: Mumbling, difficult to understand, is oriented x1, moves all extremities symmetrically without obvious focal deficits     ED Results / Procedures / Treatments   Labs (all labs ordered are listed, but only abnormal results are displayed) Labs Reviewed  COMPREHENSIVE METABOLIC PANEL - Abnormal; Notable for the following components:      Result Value   Glucose, Bld 149 (*)    BUN 32 (*)    Creatinine, Ser 2.02 (*)    Albumin 3.3 (*)    AST 52 (*)    GFR, Estimated 24 (*)    All other components within normal limits  CBC - Abnormal; Notable for the following components:   WBC 12.3 (*)    RBC 2.99 (*)    Hemoglobin 7.8 (*)    HCT 25.6 (*)    RDW 17.2 (*)    All other components within normal limits  PROTIME-INR - Abnormal; Notable for the following components:   Prothrombin Time 35.1 (*)    INR 3.5 (*)    All other components within normal limits  BRAIN NATRIURETIC PEPTIDE - Abnormal; Notable for the following components:   B Natriuretic Peptide 417.3 (*)    All other components within normal limits  CULTURE, BLOOD (ROUTINE X 2)  CULTURE, BLOOD (ROUTINE X 2)  RESP PANEL BY RT-PCR (FLU A&B, COVID) ARPGX2  OCCULT BLOOD X 1 CARD TO LAB, STOOL  LACTIC ACID, PLASMA  LACTIC ACID, PLASMA  URINALYSIS, ROUTINE W REFLEX MICROSCOPIC    EKG EKG Interpretation  Date/Time:  Friday September 03 2021 12:33:06 EDT Ventricular Rate:  56 PR Interval:    QRS Duration: 138 QT Interval:  508 QTC  Calculation: 490 R Axis:   83 Text Interpretation: Wide QRS rhythm Right bundle branch block Abnormal ECG When compared with ECG of 10-Jul-2021 08:51, Wide QRS rhythm has replaced Sinus rhythm Vent. rate has decreased BY  31 BPM similar morphology to prior EKG Confirmed by Malvin Johns [phone]) on 08/18/2021 1:27:17 PM  Radiology DG Chest Port 1 View  Result Date: 08/26/2021 CLINICAL DATA:  A female at age 35 presents for assessment of altered mental status/delirium. EXAM: PORTABLE CHEST 1 VIEW COMPARISON:  July 09, 2021 FINDINGS: EKG leads project over the chest. Cardiac loop recorder over the LEFT chest.  Post median sternotomy. Trachea midline. Cardiomediastinal contours remain enlarged. Fullness of RIGHT and LEFT hilum. Increasing opacities throughout the chest. Persistent opacity in the LEFT mid chest but with numerous opacities throughout the LEFT and RIGHT chest compatible with multifocal process. No pneumothorax. On limited assessment there is no acute skeletal finding. IMPRESSION: 1. Findings most suggestive of worsening of multifocal pneumonia. Continued worsening of multifocal airspace disease is noted on today's study. Nodular appearance is apparent. Close follow-up is suggested with correlation with any symptoms of infection. Given nodular appearance and continued progression, CT may be helpful for further evaluation. Electronically Signed   By: Zetta Bills M.D.   On: 08/23/2021 13:50   CT Head Wo Contrast  Result Date: 08/27/2021 CLINICAL DATA:  Mental status changes.  Unknown cause.  Delirium. EXAM: CT HEAD WITHOUT CONTRAST TECHNIQUE: Contiguous axial images were obtained from the base of the skull through the vertex without intravenous contrast. RADIATION DOSE REDUCTION: This exam was performed according to the departmental dose-optimization program which includes automated exposure control, adjustment of the mA and/or kV according to patient size and/or use of  iterative reconstruction  technique. COMPARISON:  CT head without and contrast 08/23/2018 FINDINGS: Brain: No acute infarct, hemorrhage, or mass lesion is present. Moderate atrophy and white matter changes are stable. A remote lacunar infarct in the posterior right cerebellum is new from the prior exam but does not appear acute. The ventricles are of normal size. No significant extraaxial fluid collection is present. Vascular: Atherosclerotic calcifications are present within the cavernous internal carotid arteries at the dural margin of both vertebral arteries. No hyperdense vessel is present. Skull: Calvarium is intact. No focal lytic or blastic lesions are present. No significant extracranial soft tissue lesion is present. Sinuses/Orbits: The paranasal sinuses and mastoid air cells are clear. Bilateral lens replacements are noted. Globes and orbits are otherwise unremarkable. IMPRESSION: 1. No acute intracranial abnormality or significant interval change. 2. Stable atrophy and white matter disease. This likely reflects the sequela of chronic microvascular ischemia. 3. 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 Electronically Signed   By: San Morelle M.D.   On: 09/08/2021 13:47    Procedures Procedures    Medications Ordered in ED Medications  cefTRIAXone (ROCEPHIN) 2 g in sodium chloride 0.9 % 100 mL IVPB (has no administration in time range)  azithromycin (ZITHROMAX) 500 mg in sodium chloride 0.9 % 250 mL IVPB (has no administration in time range)  sodium chloride 0.9 % bolus 500 mL (500 mLs Intravenous New Bag/Given 08/11/2021 1429)    ED Course/ Medical Decision Making/ A&P                           Medical Decision Making Amount and/or Complexity of Data Reviewed Labs: ordered. Radiology: ordered.  Risk Decision regarding hospitalization.   Patient is a 88 year old female who presents from home with weakness and confusion.  She was found  to have a rectal temp of 100.5.  Her WBC count is elevated at 12.  Given this, she was treated for sepsis.  She was initially given a small bolus of normal saline at 500 cc.  Her lactate is pending.  She was started on IV antibiotics.  Her chest x-ray shows evidence of multifocal pneumonia.  This was interpreted by me and confirmed by radiology.  Her BNP is also a little bit up and she may have a component of pulmonary edema with pneumonia.  Her creatinine is elevated as compared to prior values.  She has had some mild hypoxia and is doing well on a nasal cannula at 2 L.  No significant increased work of breathing.  Her hemoglobin has dropped compared to her old values on chart review.  Hemoccult is negative for blood.  This will need to be monitored.  COVID/flu test is pending.  We are unable to get a type and screen at this facility.  We will plan admission.  I spoke with Dr. Algis Liming who is excepted the patient for admission.  CRITICAL CARE Performed by: Malvin Johns Total critical care time: 70 minutes Critical care time was exclusive of separately billable procedures and treating other patients. Critical care was necessary to treat or prevent imminent or life-threatening deterioration. Critical care was time spent personally by me on the following activities: development of treatment plan with patient and/or surrogate as well as nursing, discussions with consultants, evaluation of patient's response to treatment, examination of patient, obtaining history from patient or surrogate, ordering and performing treatments and interventions, ordering  and review of laboratory studies, ordering and review of radiographic studies, pulse oximetry and re-evaluation of patient's condition.   Final Clinical Impression(s) / ED Diagnoses Final diagnoses:  Sepsis, due to unspecified organism, unspecified whether acute organ dysfunction present [facility])  Encephalopathy  Community acquired pneumonia, unspecified  laterality    Rx / DC Orders ED Discharge Orders     None         Malvin Johns, MD 09/05/2021 1525

## 2021-09-03 NOTE — ED Notes (Signed)
Robin - Medic present during vitals

## 2021-09-03 NOTE — ED Notes (Signed)
PRT note: Pt. seen in [place] while in home w/c using 2 lpm n/c per home use-continuously also with nightly home CPAP use, no Pulmonary Medicine taken, no >'d procuctive cough per family members at side, b/l b.s.slight diminished b/l with few Crackles on Insp. R>L with hx. CHF, no wheeze, (+)Diabetic.,non smoker.

## 2021-09-03 NOTE — Progress Notes (Signed)
TRH transfer acceptance note, no charge  Transferring facility: [facility] EMERG[facility] DEPT Transferring MD: Malvin Johns, MD, EDP Accepting MD: Dr. Algis Liming Accepting facility: [facility], inpatient, telemetry bed  88 year old female, lives with family, PMH of dementia, chronic CHF, PAF on Eliquis, CAD s/p stent, CKD, DM, presented to the ED with 5 days of progressive decline, altered mental status, hallucinations, weakness, decreased oral intake and urination.  Seen by PCP on 8/24, unable to get blood or urine sample, started on Keflex for possible UTI.  In ED, rectal temperature of 100.5, oxygen saturations had to be at 88% on room air, transiently tachypneic in the 20s.  Per report, not in respiratory distress, awake and alert.  Lab work significant for BUN 32, creatinine 2.02 (1.4 on 1/4), BNP 417, WBC 12.3, hemoglobin 7.8-down from 9.9 on 7/4 in the absence of reported overt bleeding, INR 3.5 but on Eliquis, FOBT negative, CT head without acute findings, chest x-ray reported as multifocal pneumonia.  Assessed as multifocal pneumonia, EDP also concerned that he may have an element of decompensated CHF.  Given IV fluids 500 mL bolus x1, initiated on IV ceftriaxone and azithromycin.  SARS coronavirus 19 RT-PCR requested and not resulted.  Also has AKI, acute on chronic anemia of unclear etiology.  Will need admitting physician to evaluate and place orders upon arrival to [facility].  Vernell Leep, MD,  FACP, [facility], [facility], [facility] (Care Management Physician Certified) [place]  To contact the attending provider between 7A-7P or the covering provider during after hours 7P-7A, please log into the web site [email or web address] and access using universal [place] password for that web site. If you do not have the password, please call the hospital operator.

## 2021-09-03 NOTE — H&P (Signed)
History and Physical   Linda Oliver OZY:[id number] DOB: 11/18/1932 DOA: 09/01/2021  PCP: Shon Baton, MD   Patient coming from: Home  Chief Complaint: AMS  HPI: Linda Oliver is a 88 y.o. female with medical history significant of diabetes, neuropathy, GERD, obesity, carotid artery disease, CAD status post CABG, CVA, CHF, hypertension, atrial fibrillation, OSA on CPAP, mild cognitive impairment, anemia presenting with altered mental status.  Patient presenting with encephalopathy so history obtained with assistance of daughter and chart review.  Patient currently lives with her daughter who has noticed a decline in the past 5 days.  Patient now altered and weak.  Daughter reported patient has been talking to people who are not there and has been mumbling a lot.  She also has been less communicative with her daughter compared to her baseline.  She is had decreased p.o. intake and decreased urination.  She has slid out of her bed a couple of times as she does not seem to have the strength to stand/walk.  Patient was taken to their PCP yesterday but they were unable to get blood work or urine there and patient was started on Keflex in case she was developing UTI.  Unable to obtain full review of systems due to patient's encephalopathy/dementia.***  ED Course: Vital signs in the ED significant for fever to 100.5, heart rate in the 50s to 60s, blood pressure in the 048G to 891Q systolic, respiratory rate in the teens to 20s requiring 2 L to maintain saturations and desaturating to upper 80s on room air.  Lab work-up included CMP with BUN of 32, creatinine 2.02 from baseline of 1.4, glucose 149, albumin 3.3, AST 52.  CBC with leukocytosis to 12.3, hemoglobin 7.8 down from baseline of around 10 with low normal MCV.  PT and INR elevated at 35.1 and 3.5 respectively.  BNP elevated to 417.  FOBT negative.  Lactic acid normal, 2.3, 2.2 on repeat's.  Respiratory panel flu and COVID-negative.  Urinalysis with  glucose and protein only.  Blood cultures pending.  CT head showed no acute abnormality.  Chest x-ray showed findings consistent with increasing multifocal pneumonia.  Patient received ceftriaxone azithromycin in the ED as well as Tylenol and a liter of fluids.  Review of Systems: As per HPI otherwise all other systems reviewed and are negative.***  Past Medical History:  Diagnosis Date   Anginal pain ([place])    occ   Anxiety    Broken ankle    Bullous pemphigus    CAD (coronary artery disease)    [place]   80% stenosis ostial Left main, 30% stenosis proximal LAD, 80% stenosis proximal RCA CABG  -  LIMA to LAD, SVG to int, SVG to [place], [place]    Depression    Diabetic peripheral neuropathy ([place])    Encounter for loop recorder check 04/11/2019   GERD (gastroesophageal reflux disease)    H/O: stroke: Left occipital stroke and vision loss 08/24/2018   Headache(784.0)    hx migraines   Heart murmur    mvp   Hyperlipidemia    Hypertension    Hypertensive heart disease    Loop recorder Reveal 08/24/2018 04/11/2019   Scheduled Remote loop recorder check 03/10/2019: The presenting rhythm is sinus bradycardia. There were 0 symptomatic patient activations recorded. There were 275 AF episodes and 0 AT episodes recorded, representing a 4.2 % cumulative AT/AF burden. The longest episode lasted 00:03:40:00 in duration. Some false with PAC. There were  0 tachy episodes detected. There were 0 pause episodes detected. Th   Nasal bleeding    occ due to O2   OA (osteoarthritis)    Obesity (BMI 30-39.9)    Obstructive sleep apnea on CPAP    Osteopenia    Paroxysmal atrial fibrillation ([place]) 04/17/2019   Peripheral neuropathy    Seizure ([place])    2009   Sleep apnea    dx 49yr ago doesnt use cpap but uses O2 at nite 2L   Stones in the urinary tract    Type 2 diabetes mellitus with vascular disease ([place]    UTI (lower urinary tract infection)     Past Surgical History:  Procedure  Laterality Date   ABDOMINAL HYSTERECTOMY  70's   CARDIAC CATHETERIZATION     CARDIOVERSION N/A 07/08/2021   Procedure: CARDIOVERSION;  Surgeon: GAdrian Prows MD;  [facility]  Service: Cardiovascular;  Laterality: N/A;   CARPAL TUNNEL RELEASE Bilateral    CATARACT EXTRACTION W/PHACO Bilateral    CHOLECYSTECTOMY     CORONARY ANGIOPLASTY WITH STENT PLACEMENT  2023   CORONARY ARTERY BYPASS GRAFT  1996   EYE SURGERY Left    LOOP RECORDER INSERTION N/A 08/24/2018   Procedure: LOOP RECORDER INSERTION;  Surgeon: CConstance Haw MD;  [facility];  Service: Cardiovascular;  Laterality: N/A;   RIGHT/LEFT HEART CATH AND CORONARY/GRAFT ANGIOGRAPHY N/A 07/12/2021   Procedure: RIGHT/LEFT HEART CATH AND CORONARY/GRAFT ANGIOGRAPHY;  Surgeon: PNigel Mormon MD;  [facility];  Service: Cardiovascular;  Laterality: N/A;   SHOULDER ARTHROSCOPY     rt   SHOULDER ARTHROSCOPY WITH ROTATOR CUFF REPAIR AND SUBACROMIAL DECOMPRESSION  12/01/2011   Procedure: SHOULDER ARTHROSCOPY WITH ROTATOR CUFF REPAIR AND SUBACROMIAL DECOMPRESSION;  Surgeon: JNita Sells MD;  Location[facility]  Service: Orthopedics;  Laterality: Left;  shoulder debridement calcific tendonitis    Social History  reports that she quit smoking about 34 years ago. Her smoking use included cigarettes. She has a 30.00 pack-year smoking history. She has never used smokeless tobacco. She reports that she does not drink alcohol and does not use drugs.  Allergies  Allergen Reactions   Codeine     vomiting   Metformin     Other reaction(s): nausea, vomitting and diarrhea   Oxycodone-Acetaminophen     Other reaction(s): Unknown   Percocet [Oxycodone-Acetaminophen] Nausea And Vomiting   Tape     Other reaction(s): Unknown   Tolterodine Other (See Comments)   Latex Itching and Rash    EKG leads    Family History  Problem Relation Age of Onset   Coronary artery disease Mother     Heart disease Father    Cancer Father        skin   Coronary artery disease Father    Cancer Brother    Cancer - Colon Brother        brain, Lymphoma  Reviewed on admission  Prior to Admission medications   Medication Sig Start Date End Date Taking? Authorizing Provider  acetaminophen (TYLENOL) 500 MG tablet Take 1,000 mg by mouth every 4 (four) hours as needed for moderate pain or headache.    [provider]  amiodarone (PACERONE) 200 MG tablet Take 1 tablet (200 mg total) by mouth daily. 07/28/21 08/27/21  Cantwell, Celeste C, PA-C  apixaban (ELIQUIS) 5 MG TABS tablet Take 1 tablet (5 mg total) by mouth 2 (two) times daily. Will need to see MD for future refills.  08/22/19   Camnitz, Ocie Doyne, MD  B-D UF III MINI PEN NEEDLES 31G X 5 MM MISC SMARTSIG:1 Each SUB-Q Daily 03/14/19   [provider]  clopidogrel (PLAVIX) 75 MG tablet Take 1 tablet (75 mg total) by mouth daily. 03/11/21   Cantwell, Celeste C, PA-C  DULoxetine (CYMBALTA) 60 MG capsule Take 60 mg by mouth daily. 10/16/17   [provider]  ezetimibe (ZETIA) 10 MG tablet Take 10 mg by mouth daily. 09/17/19   [provider]  Ferrous Sulfate Dried (HIGH POTENCY IRON) 65 MG TABS Take 65 mg by mouth daily.    [provider]  fesoterodine (TOVIAZ) 8 MG TB24 tablet Take 8 mg by mouth every evening.     [provider]  fluticasone (FLONASE) 50 MCG/ACT nasal spray Place 1 spray into both nostrils 2 (two) times daily as needed for allergies or rhinitis.    [provider]  FREESTYLE LITE test strip Check blood sugar every morning 04/09/19   [provider]  furosemide (LASIX) 40 MG tablet Take 1 tablet (40 mg total) by mouth daily. 07/14/21 08/13/21  Arrien, Jimmy Picket, MD  gabapentin (NEURONTIN) 300 MG capsule Take 300 mg by mouth 2 (two) times daily.    [provider]  hydrOXYzine (ATARAX/VISTARIL) 25 MG tablet Take 25-50 mg by mouth 3 (three) times daily. 25 mg  in the morning 50 mg at bedtime    [provider]  Insulin Glargine (LANTUS SOLOSTAR) 100 UNIT/ML Solostar Pen Inject 25 Units into the skin daily before breakfast.    [provider]  isosorbide dinitrate (ISORDIL) 30 MG tablet TAKE 1 TABLET THREE TIMES A DAY Patient taking differently: Take 30 mg by mouth 2 (two) times daily. 07/06/21   Adrian Prows, MD  loperamide (IMODIUM A-D) 2 MG tablet Take 2 mg by mouth 4 (four) times daily as needed for diarrhea or loose stools.    [provider]  metoprolol succinate (TOPROL-XL) 25 MG 24 hr tablet Take 12.5 mg by mouth 2 (two) times daily.    [provider]  NAMZARIC 28-10 MG CP24 Take 1 capsule by mouth daily. Patient taking differently: Take 1 capsule by mouth at bedtime. 06/09/21   Melvenia Beam, MD  nitroGLYCERIN (NITROSTAT) 0.4 MG SL tablet Place 1 tablet (0.4 mg total) under the tongue every 5 (five) minutes as needed for up to 25 days for chest pain. Patient taking differently: Place 0.4 mg under the tongue every 5 (five) minutes as needed for chest pain. 05/29/20 07/07/21  Adrian Prows, MD  NON FORMULARY CPAP at bedtime    [provider]  pantoprazole (PROTONIX) 40 MG tablet Take 40 mg by mouth daily as needed (heartburn). 10/31/17   [provider]  potassium chloride SA (KLOR-CON M) 20 MEQ tablet TAKE 1 TABLET DAILY Patient taking differently: Take 20 mEq by mouth daily. 01/29/21   Adrian Prows, MD  rosuvastatin (CRESTOR) 10 MG tablet Take 10 mg by mouth daily before supper.    [provider]  sitaGLIPtin (JANUVIA) 100 MG tablet Take 100 mg by mouth daily.    [provider]  Vibegron (GEMTESA) 75 MG TABS Take 75 mg by mouth daily.    [provider]  vitamin B-12 (CYANOCOBALAMIN) 1000 MCG tablet Take 1,000 mcg by mouth at bedtime.    [provider]    Physical Exam: Vitals:   08/26/2021 2159 08/13/2021 2244 08/29/2021 2248 08/11/2021 2318  BP:  (!) 138/117   Marland Kitchen)  157/78  Pulse:  (!) 53  (!) 49  Resp:   19   Temp: 98.5 F (36.9 C) 98.7 F (37.1 C)    TempSrc: Oral Oral    SpO2:  96% 96%   Weight:      Height:        Physical Exam Constitutional:      General: She is not in acute distress.    Appearance: Normal appearance.  HENT:     Head: Normocephalic and atraumatic.     Mouth/Throat:     Mouth: Mucous membranes are moist.     Pharynx: Oropharynx is clear.  Eyes:     Extraocular Movements: Extraocular movements intact.     Pupils: Pupils are equal, round, and reactive to light.  Cardiovascular:     Rate and Rhythm: Normal rate and regular rhythm.     Pulses: Normal pulses.     Heart sounds: Normal heart sounds.  Pulmonary:     Effort: Pulmonary effort is normal. No respiratory distress.     Breath sounds: Normal breath sounds.  Abdominal:     General: Bowel sounds are normal. There is no distension.     Palpations: Abdomen is soft.     Tenderness: There is no abdominal tenderness.  Musculoskeletal:        General: No swelling or deformity.  Skin:    General: Skin is warm and dry.  Neurological:     General: No focal deficit present.     Mental Status: Mental status is at baseline.   ***Edema?,  Rales?  Labs on Admission: I have personally reviewed following labs and imaging studies  CBC: Recent Labs  Lab 09/04/2021 1250  WBC 12.3*  HGB 7.8*  HCT 25.6*  MCV 85.6  PLT 878    Basic Metabolic Panel: Recent Labs  Lab 08/10/2021 1250  NA 140  K 4.2  CL 103  CO2 27  GLUCOSE 149*  BUN 32*  CREATININE 2.02*  CALCIUM 9.0    GFR: Estimated Creatinine Clearance: 17.6 mL/min (A) (by C-G formula based on SCr of 2.02 mg/dL (H)).  Liver Function Tests: Recent Labs  Lab 08/11/2021 1250  AST 52*  ALT 32  ALKPHOS 102  BILITOT 0.9  PROT 6.6  ALBUMIN 3.3*    Urine analysis:    Component Value Date/Time   COLORURINE YELLOW 08/24/2021 [place] 09/02/2021 1636   LABSPEC 1.012 08/21/2021 1636    PHURINE 5.0 09/09/2021 1636   GLUCOSEU >1,000 (A) 08/29/2021 1636   HGBUR NEGATIVE 08/16/2021 1636   BILIRUBINUR NEGATIVE 09/07/2021 1636   KETONESUR NEGATIVE 08/13/2021 1636   PROTEINUR 30 (A) 08/30/2021 1636   UROBILINOGEN 1.0 09/18/2019 1903   NITRITE NEGATIVE 08/16/2021 1636   LEUKOCYTESUR NEGATIVE 08/19/2021 1636    Radiological Exams on Admission: DG Chest Port 1 View  Result Date: 09/09/2021 CLINICAL DATA:  A female at age 29 presents for assessment of altered mental status/delirium. EXAM: PORTABLE CHEST 1 VIEW COMPARISON:  July 09, 2021 FINDINGS: EKG leads project over the chest. Cardiac loop recorder over the LEFT chest.  Post median sternotomy. Trachea midline. Cardiomediastinal contours remain enlarged. Fullness of RIGHT and LEFT hilum. Increasing opacities throughout the chest. Persistent opacity in the LEFT mid chest but with numerous opacities throughout the LEFT and RIGHT chest compatible with multifocal process. No pneumothorax. On limited assessment there is no acute skeletal finding. IMPRESSION: 1. Findings most suggestive of worsening of multifocal pneumonia. Continued worsening of multifocal airspace disease is  noted on today's study. Nodular appearance is apparent. Close follow-up is suggested with correlation with any symptoms of infection. Given nodular appearance and continued progression, CT may be helpful for further evaluation. Electronically Signed   By: Zetta Bills M.D.   On: 08/25/2021 13:50   CT Head Wo Contrast  Result Date: 08/15/2021 CLINICAL DATA:  Mental status changes.  Unknown cause.  Delirium. EXAM: CT HEAD WITHOUT CONTRAST TECHNIQUE: Contiguous axial images were obtained from the base of the skull through the vertex without intravenous contrast. RADIATION DOSE REDUCTION: This exam was performed according to the departmental dose-optimization program which includes automated exposure control, adjustment of the mA and/or kV according to patient size  and/or use of iterative reconstruction technique. COMPARISON:  CT head without and contrast 08/23/2018 FINDINGS: Brain: No acute infarct, hemorrhage, or mass lesion is present. Moderate atrophy and white matter changes are stable. A remote lacunar infarct in the posterior right cerebellum is new from the prior exam but does not appear acute. The ventricles are of normal size. No significant extraaxial fluid collection is present. Vascular: Atherosclerotic calcifications are present within the cavernous internal carotid arteries at the dural margin of both vertebral arteries. No hyperdense vessel is present. Skull: Calvarium is intact. No focal lytic or blastic lesions are present. No significant extracranial soft tissue lesion is present. Sinuses/Orbits: The paranasal sinuses and mastoid air cells are clear. Bilateral lens replacements are noted. Globes and orbits are otherwise unremarkable. IMPRESSION: 1. No acute intracranial abnormality or significant interval change. 2. Stable atrophy and white matter disease. This likely reflects the sequela of chronic microvascular ischemia. 3. 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 Electronically Signed   By: San Morelle M.D.   On: 09/01/2021 13:47    EKG: Independently reviewed.  Sinus rhythm at 56 bpm.  Right bundle branch block.  Wide QRS.  Similar morphology to previous but rate slower.  QTc 490.  Assessment/Plan Principal Problem:   Multifocal pneumonia Active Problems:   H/O: stroke: Left occipital stroke and vision loss   Diabetic peripheral neuropathy (HCC)   Essential hypertension   Paroxysmal atrial fibrillation (HCC)   GERD (gastroesophageal reflux disease)   Type 2 diabetes mellitus with hyperlipidemia (HCC)   OSA on CPAP   Obesity (BMI 30-39.9)   Acute kidney injury superimposed on chronic kidney disease ([place])   Coronary artery disease without angina pectoris   H/O  three vessel coronary artery bypass   Carotid stenosis, asymptomatic, bilateral   Acute on chronic diastolic heart failure (HCC)   Chronic anemia   Acute respiratory failure with hypoxia (HCC)   Multifocal pneumonia Acute encephalopathy > Patient presenting with general Caryl Comes x5 days per daughter with weakness and confusion. > Found to have evidence of multifocal pneumonia on chest x-ray with leukocytosis 12.3 and new oxygen requirement. > No evidence of other infectious etiology with urinalysis negative. > Initial lactic acid normal followed by mildly elevated at 2.3 followed by improved to 2.2.  Received a liter of fluids in the ED. > Started on ceftriaxone and azithromycin in the ED. - Monitor on telemetry - Continue with ceftriaxone and azithromycin - Continue supplemental oxygen, wean as tolerated - Trend fever curve and WBC - Follow-up blood cultures  Acute respiratory failure with hypoxia Chronic systolic CHF > Patient presenting with acute hypoxic respiratory failure requiring 2 L to maintain saturations.  Likely multifactorial in the setting of pneumonia as above and  degree of CHF exacerbation. > Details of pneumonia are above.  Noted to have evidence of possible mild CHF exacerbation with BNP elevated to 417 and exam***. > Last echo was 2 months ago with EF 35-40%, indeterminate diastolic function, normal RV function. > No Lasix given initially and in fact fluids were given due to elevated lactic acid.  We will hold off on initial IV Lasix overnight as we allow treatment to begin for infection. - Monitor on telemetry - Supplemental oxygen as needed, wean as tolerated - Strict I's and O's, daily weights - Hold off on repeat echocardiogram as one was done 2 months ago - Start IV Lasix tomorrow morning - Check magnesium - Trend renal function and electrolytes - Continue home isosorbide and metoprolol   Acute on chronic anemia > Noted to have hemoglobin of 7.8 down from  baseline of 10 in the ED. > FOBT was negative.  No overt signs of bleeding. - We will hold blood thinner overnight in case there is any none overt bleeding occurring. - Trend hemoglobin overnight - Check iron, ferritin, reticulocytes given low normal MCV - Type and screen  AKI on CKD > Creatinine elevated to 2.02 from baseline 1.4.  Unclear if this represents dehydration versus volume overload given mild lactic acid elevation as above but also having elevated BNP. > We will monitor response to initial IV fluids given in the ED but plan to start diuretic tomorrow. - Trend renal function and electrolytes - Avoid nephrotoxic agents when able  Carotid artery disease CAD status post CABG History of stroke - Continue home Plavix, Zetia, rosuvastatin - Continue home isosorbide, and metoprolol holding home metoprolol  Hypertension  - Receiving Lasix tomorrow as above - Holding home metoprolol  Atrial fibrillation - Continue home amiodarone and metoprolol - Holding home Eliquis in the setting of anemia work-up ongoing   Diabetes > Chart lists 24 units daily. - SSI***  OSA - Continue home CPAP  Mild cognitive impairment/dementia - Continue home donepezil/memantine Combination pill  GERD - Continue home PPI   Obesity - Noted  ***  DVT prophylaxis: SCDs Code Status:   Full Family Communication:  ***  Disposition Plan:   Patient is from:  Home  Anticipated DC to:  Home  Anticipated DC date:  2 to 5 days  Anticipated DC barriers: None  Consults called:  None Admission status:  Inpatient, telemetry  Severity of Illness: The appropriate patient status for this patient is INPATIENT. Inpatient status is judged to be reasonable and necessary in order to provide the required intensity of service to ensure the patient's safety. The patient's presenting symptoms, physical exam findings, and initial radiographic and laboratory data in the context of their chronic comorbidities is  felt to place them at high risk for further clinical deterioration. Furthermore, it is not anticipated that the patient will be medically stable for discharge from the hospital within 2 midnights of admission.   * I certify that at the point of admission it is my clinical judgment that the patient will require inpatient hospital care spanning beyond 2 midnights from the point of admission due to high intensity of service, high risk for further deterioration and high frequency of surveillance required.Marcelyn Bruins MD [facility]  How to contact the [facility] Attending or Consulting provider [place] or covering provider during after hours [place], for this patient?   Check the care team in [facility] and look for a) attending/consulting [facility] provider listed  and b) the [facility] team listed Log into [email or web address] and use [place]'s universal password to access. If you do not have the password, please contact the hospital operator. Locate the [facility] provider you are looking for under [facility] and page to a number that you can be directly reached. If you still have difficulty reaching the provider, please page the [facility] (Director on Call) for the Hospitalists listed on amion for assistance.  09/05/2021, 11:40 PM

## 2021-09-04 ENCOUNTER — Inpatient Hospital Stay (HOSPITAL_COMMUNITY): Payer: Medicare Other

## 2021-09-04 DIAGNOSIS — J189 Pneumonia, unspecified organism: Secondary | ICD-10-CM | POA: Diagnosis not present

## 2021-09-04 DIAGNOSIS — J9601 Acute respiratory failure with hypoxia: Secondary | ICD-10-CM | POA: Diagnosis not present

## 2021-09-04 DIAGNOSIS — I5023 Acute on chronic systolic (congestive) heart failure: Secondary | ICD-10-CM | POA: Diagnosis not present

## 2021-09-04 DIAGNOSIS — N179 Acute kidney failure, unspecified: Secondary | ICD-10-CM | POA: Diagnosis not present

## 2021-09-04 LAB — CBC
HCT: 24.9 % — ABNORMAL LOW (ref 36.0–46.0)
HCT: 27.7 % — ABNORMAL LOW (ref 36.0–46.0)
Hemoglobin: 7.3 g/dL — ABNORMAL LOW (ref 12.0–15.0)
Hemoglobin: 8.2 g/dL — ABNORMAL LOW (ref 12.0–15.0)
MCH: 25.8 pg — ABNORMAL LOW (ref 26.0–34.0)
MCH: 26 pg (ref 26.0–34.0)
MCHC: 29.3 g/dL — ABNORMAL LOW (ref 30.0–36.0)
MCHC: 29.6 g/dL — ABNORMAL LOW (ref 30.0–36.0)
MCV: 87.9 fL (ref 80.0–100.0)
MCV: 88 fL (ref 80.0–100.0)
Platelets: 244 10*3/uL (ref 150–400)
Platelets: 278 10*3/uL (ref 150–400)
RBC: 2.83 MIL/uL — ABNORMAL LOW (ref 3.87–5.11)
RBC: 3.15 MIL/uL — ABNORMAL LOW (ref 3.87–5.11)
RDW: 17.2 % — ABNORMAL HIGH (ref 11.5–15.5)
RDW: 17.3 % — ABNORMAL HIGH (ref 11.5–15.5)
WBC: 12.9 10*3/uL — ABNORMAL HIGH (ref 4.0–10.5)
WBC: 13.3 10*3/uL — ABNORMAL HIGH (ref 4.0–10.5)
nRBC: 0.2 % (ref 0.0–0.2)
nRBC: 0.2 % (ref 0.0–0.2)

## 2021-09-04 LAB — BLOOD CULTURE ID PANEL (REFLEXED) - BCID2

## 2021-09-04 LAB — RETICULOCYTES
Immature Retic Fract: 30.9 % — ABNORMAL HIGH (ref 2.3–15.9)
RBC.: 2.77 MIL/uL — ABNORMAL LOW (ref 3.87–5.11)
Retic Count, Absolute: 93.6 10*3/uL (ref 19.0–186.0)
Retic Ct Pct: 3.4 % — ABNORMAL HIGH (ref 0.4–3.1)

## 2021-09-04 LAB — GLUCOSE, CAPILLARY
Glucose-Capillary: 43 mg/dL — CL (ref 70–99)
Glucose-Capillary: 73 mg/dL (ref 70–99)
Glucose-Capillary: 186 mg/dL — ABNORMAL HIGH (ref 70–99)
Glucose-Capillary: 188 mg/dL — ABNORMAL HIGH (ref 70–99)
Glucose-Capillary: 110 mg/dL — ABNORMAL HIGH (ref 70–99)

## 2021-09-04 LAB — COMPREHENSIVE METABOLIC PANEL
ALT: 60 U/L — ABNORMAL HIGH (ref 0–44)
AST: 124 U/L — ABNORMAL HIGH (ref 15–41)
Albumin: 2.6 g/dL — ABNORMAL LOW (ref 3.5–5.0)
Alkaline Phosphatase: 108 U/L (ref 38–126)
Anion gap: 9 (ref 5–15)
BUN: 34 mg/dL — ABNORMAL HIGH (ref 8–23)
CO2: 25 mmol/L (ref 22–32)
Calcium: 8.5 mg/dL — ABNORMAL LOW (ref 8.9–10.3)
Chloride: 108 mmol/L (ref 98–111)
Creatinine, Ser: 2.05 mg/dL — ABNORMAL HIGH (ref 0.44–1.00)
GFR, Estimated: 23 mL/min — ABNORMAL LOW (ref >60)
Glucose, Bld: 58 mg/dL — ABNORMAL LOW (ref 70–99)
Potassium: 4 mmol/L (ref 3.5–5.1)
Sodium: 142 mmol/L (ref 135–145)
Total Bilirubin: 0.9 mg/dL (ref 0.3–1.2)
Total Protein: 6.2 g/dL — ABNORMAL LOW (ref 6.5–8.1)

## 2021-09-04 LAB — TYPE AND SCREEN
ABO/RH(D): O POS
Antibody Screen: NEGATIVE

## 2021-09-04 LAB — FERRITIN: Ferritin: 69 ng/mL (ref 11–307)

## 2021-09-04 LAB — IRON AND TIBC
Iron: 31 ug/dL (ref 28–170)
Saturation Ratios: 14 % (ref 10.4–31.8)
TIBC: 223 ug/dL — ABNORMAL LOW (ref 250–450)
UIBC: 192 ug/dL

## 2021-09-04 LAB — ABO/RH: ABO/RH(D): O POS

## 2021-09-04 LAB — MAGNESIUM: Magnesium: 2.1 mg/dL (ref 1.7–2.4)

## 2021-09-04 MED ORDER — AZITHROMYCIN 250 MG PO TABS
500.0000 mg | ORAL_TABLET | Freq: Every day | ORAL | Status: AC
  Administered 2021-09-04 – 2021-09-07 (×4): 500 mg via ORAL
  Filled 2021-09-04 (×4): qty 2

## 2021-09-04 MED ORDER — LEVALBUTEROL HCL 0.63 MG/3ML IN NEBU
0.6300 mg | INHALATION_SOLUTION | Freq: Two times a day (BID) | RESPIRATORY_TRACT | Status: DC
Start: 2021-09-04 — End: 2021-09-09
  Administered 2021-09-04 – 2021-09-08 (×9): 0.63 mg via RESPIRATORY_TRACT
  Filled 2021-09-04 (×11): qty 3

## 2021-09-04 MED ORDER — SODIUM CHLORIDE 0.9 % IV SOLN
500.0000 mg | INTRAVENOUS | Status: DC
  Filled 2021-09-04: qty 5

## 2021-09-04 MED ORDER — IPRATROPIUM BROMIDE 0.02 % IN SOLN
0.5000 mg | Freq: Two times a day (BID) | RESPIRATORY_TRACT | Status: DC
  Administered 2021-09-04 – 2021-09-09 (×10): 0.5 mg via RESPIRATORY_TRACT
  Filled 2021-09-04 (×11): qty 2.5

## 2021-09-04 MED ORDER — SODIUM CHLORIDE 0.9 % IV SOLN
2.0000 g | INTRAVENOUS | Status: DC
  Administered 2021-09-04 – 2021-09-09 (×6): 2 g via INTRAVENOUS
  Filled 2021-09-04 (×6): qty 20

## 2021-09-04 MED ORDER — IPRATROPIUM BROMIDE 0.02 % IN SOLN
0.5000 mg | Freq: Four times a day (QID) | RESPIRATORY_TRACT | Status: DC
  Administered 2021-09-04: 0.5 mg via RESPIRATORY_TRACT
  Filled 2021-09-04: qty 2.5

## 2021-09-04 MED ORDER — ORAL CARE MOUTH RINSE: 15.0000 mL | OROMUCOSAL | Status: DC | PRN

## 2021-09-04 MED ORDER — FUROSEMIDE 10 MG/ML IJ SOLN
20.0000 mg | Freq: Once | INTRAMUSCULAR | Status: AC
Start: 2021-09-04 — End: 2021-09-04
  Administered 2021-09-04: 20 mg via INTRAVENOUS
  Filled 2021-09-04: qty 2

## 2021-09-04 MED ORDER — LEVALBUTEROL HCL 0.63 MG/3ML IN NEBU
0.6300 mg | INHALATION_SOLUTION | Freq: Four times a day (QID) | RESPIRATORY_TRACT | Status: DC
  Administered 2021-09-04: 0.63 mg via RESPIRATORY_TRACT
  Filled 2021-09-04: qty 3

## 2021-09-04 MED ORDER — HYDRALAZINE HCL 20 MG/ML IJ SOLN
10.0000 mg | Freq: Four times a day (QID) | INTRAMUSCULAR | Status: DC | PRN
  Administered 2021-09-04 – 2021-09-13 (×4): 10 mg via INTRAVENOUS
  Filled 2021-09-04 (×5): qty 1

## 2021-09-04 MED ORDER — GLUCOSE 40 % PO GEL: 2.0000 | ORAL | Status: AC

## 2021-09-04 MED ORDER — INSULIN GLARGINE-YFGN 100 UNIT/ML ~~LOC~~ SOLN
12.0000 [IU] | Freq: Every day | SUBCUTANEOUS | Status: DC
  Administered 2021-09-05 – 2021-09-06 (×2): 12 [IU] via SUBCUTANEOUS
  Filled 2021-09-04 (×4): qty 0.12

## 2021-09-04 MED ORDER — INSULIN ASPART 100 UNIT/ML IJ SOLN
0.0000 [IU] | Freq: Three times a day (TID) | INTRAMUSCULAR | Status: DC
  Administered 2021-09-04 – 2021-09-05 (×2): 3 [IU] via SUBCUTANEOUS
  Administered 2021-09-05 – 2021-09-06 (×2): 5 [IU] via SUBCUTANEOUS
  Administered 2021-09-06 – 2021-09-07 (×5): 8 [IU] via SUBCUTANEOUS
  Administered 2021-09-08: 3 [IU] via SUBCUTANEOUS
  Administered 2021-09-08: 5 [IU] via SUBCUTANEOUS
  Administered 2021-09-08 – 2021-09-09 (×2): 8 [IU] via SUBCUTANEOUS
  Administered 2021-09-09: 3 [IU] via SUBCUTANEOUS
  Administered 2021-09-09 – 2021-09-10 (×2): 5 [IU] via SUBCUTANEOUS
  Administered 2021-09-10: 2 [IU] via SUBCUTANEOUS
  Administered 2021-09-10: 3 [IU] via SUBCUTANEOUS
  Administered 2021-09-11: 8 [IU] via SUBCUTANEOUS
  Administered 2021-09-11: 3 [IU] via SUBCUTANEOUS
  Administered 2021-09-11: 5 [IU] via SUBCUTANEOUS
  Administered 2021-09-12: 11 [IU] via SUBCUTANEOUS
  Administered 2021-09-12: 5 [IU] via SUBCUTANEOUS
  Administered 2021-09-12 – 2021-09-13 (×2): 8 [IU] via SUBCUTANEOUS
  Administered 2021-09-13: 11 [IU] via SUBCUTANEOUS
  Administered 2021-09-13: 8 [IU] via SUBCUTANEOUS
  Administered 2021-09-14: 2 [IU] via SUBCUTANEOUS
  Administered 2021-09-14 – 2021-09-15 (×5): 3 [IU] via SUBCUTANEOUS
  Administered 2021-09-16: 11 [IU] via SUBCUTANEOUS
  Administered 2021-09-16: 8 [IU] via SUBCUTANEOUS

## 2021-09-04 NOTE — H&P (Incomplete)
History and Physical   [name] VVO:[id number] DOB: [birth year] DOA: 08/19/2021  PCP: Linda Baton, MD   Patient coming from: Home  Chief Complaint: AMS  HPI: Linda Oliver is a 88 y.o. female with medical history significant of diabetes, neuropathy, GERD, obesity, carotid artery disease, CAD status post CABG, CVA, CHF, hypertension, atrial fibrillation, OSA on CPAP, mild cognitive impairment, anemia presenting with altered mental status.  Patient presenting with encephalopathy so history obtained with assistance of daughter and chart review.  Patient currently lives with her daughter who has noticed a decline in the past 5 days.  Patient now altered and weak.  Daughter reported patient has been talking to people who are not there and has been mumbling a lot.  She also has been less communicative with her daughter compared to her baseline.  She is had decreased p.o. intake and decreased urination.  She has slid out of her bed a couple of times as she does not seem to have the strength to stand/walk.  Patient was taken to their PCP yesterday but they were unable to get blood work or urine there and patient was started on Keflex in case she was developing UTI.  Unable to obtain full review of systems due to patient's encephalopathy/dementia.  ED Course: Vital signs in the ED significant for fever to 100.5, heart rate in the 50s to 60s, blood pressure in the 269S to 854O systolic, respiratory rate in the teens to 20s requiring 2 L to maintain saturations and desaturating to upper 80s on room air.  Lab work-up included CMP with BUN of 32, creatinine 2.02 from baseline of 1.4, glucose 149, albumin 3.3, AST 52.  CBC with leukocytosis to 12.3, hemoglobin 7.8 down from baseline of around 10 with low normal MCV.  PT and INR elevated at 35.1 and 3.5 respectively.  BNP elevated to 417.  FOBT negative.  Lactic acid normal, 2.3, 2.2 on repeat's.  Respiratory panel flu and COVID-negative.  Urinalysis with glucose  and protein only.  Blood cultures pending.  CT head showed no acute abnormality.  Chest x-ray showed findings consistent with increasing multifocal pneumonia.  Patient received ceftriaxone azithromycin in the ED as well as Tylenol and a liter of fluids.  Review of Systems: Unable to obtain full review of systems due to patient's encephalopathy/dementia.  Past Medical History:  Diagnosis Date  . Anginal pain ([place])    occ  . Anxiety   . Broken ankle   . Bullous pemphigus   . CAD (coronary artery disease)    [place]   80% stenosis ostial Left main, 30% stenosis proximal LAD, 80% stenosis proximal RCA CABG  -  LIMA to LAD, SVG to int, SVG to [place], [place]   . Depression   . Diabetic peripheral neuropathy ([place])   . Encounter for loop recorder check 04/11/2019  . GERD (gastroesophageal reflux disease)   . H/O: stroke: Left occipital stroke and vision loss 08/24/2018  . Headache(784.0)    hx migraines  . Heart murmur    mvp  . Hyperlipidemia   . Hypertension   . Hypertensive heart disease   . Loop recorder Reveal 08/24/2018 04/11/2019   Scheduled Remote loop recorder check 03/10/2019: The presenting rhythm is sinus bradycardia. There were 0 symptomatic patient activations recorded. There were 275 AF episodes and 0 AT episodes recorded, representing a 4.2 % cumulative AT/AF burden. The longest episode lasted 00:03:40:00 in duration. Some false with PAC. There were  0 tachy episodes detected. There were 0 pause episodes detected. Th  . Nasal bleeding    occ due to O2  . OA (osteoarthritis)   . Obesity (BMI 30-39.9)   . Obstructive sleep apnea on CPAP   . Osteopenia   . Paroxysmal atrial fibrillation ([place]) 04/17/2019  . Peripheral neuropathy   . Seizure ([place])    2009  . Sleep apnea    dx 62yr ago doesnt use cpap but uses O2 at nite 2L  . Stones in the urinary tract   . Type 2 diabetes mellitus with vascular disease ([place]   . UTI (lower urinary tract infection)      Past Surgical History:  Procedure Laterality Date  . ABDOMINAL HYSTERECTOMY  70's  . CARDIAC CATHETERIZATION    . CARDIOVERSION N/A 07/08/2021   Procedure: CARDIOVERSION;  Surgeon: GAdrian Prows MD;  [facility]  Service: Cardiovascular;  Laterality: N/A;  . CARPAL TUNNEL RELEASE Bilateral   . CATARACT EXTRACTION W/PHACO Bilateral   . CHOLECYSTECTOMY    . CORONARY ANGIOPLASTY WITH STENT PLACEMENT  2023  . CORONARY ARTERY BYPASS GRAFT  1996  . EYE SURGERY Left   . LOOP RECORDER INSERTION N/A 08/24/2018   Procedure: LOOP RECORDER INSERTION;  Surgeon: CConstance Haw MD;  [facility];  Service: Cardiovascular;  Laterality: N/A;  . RIGHT/LEFT HEART CATH AND CORONARY/GRAFT ANGIOGRAPHY N/A 07/12/2021   Procedure: RIGHT/LEFT HEART CATH AND CORONARY/GRAFT ANGIOGRAPHY;  Surgeon: PNigel Mormon MD;  [facility];  Service: Cardiovascular;  Laterality: N/A;  . SHOULDER ARTHROSCOPY     rt  . SHOULDER ARTHROSCOPY WITH ROTATOR CUFF REPAIR AND SUBACROMIAL DECOMPRESSION  12/01/2011   Procedure: SHOULDER ARTHROSCOPY WITH ROTATOR CUFF REPAIR AND SUBACROMIAL DECOMPRESSION;  Surgeon: JNita Sells MD;  [facility]  Service: Orthopedics;  Laterality: Left;  shoulder debridement calcific tendonitis    Social History  reports that she quit smoking about 34 years ago. Her smoking use included cigarettes. She has a 30.00 pack-year smoking history. She has never used smokeless tobacco. She reports that she does not drink alcohol and does not use drugs.  Allergies  Allergen Reactions  . Codeine     vomiting  . Metformin     Other reaction(s): nausea, vomitting and diarrhea  . Oxycodone-Acetaminophen     Other reaction(s): Unknown  . Percocet [Oxycodone-Acetaminophen] Nausea And Vomiting  . Tape     Other reaction(s): Unknown  . Tolterodine Other (See Comments)  . Latex Itching and Rash    EKG leads    Family History  Problem  Relation Age of Onset  . Coronary artery disease Mother   . Heart disease Father   . Cancer Father        skin  . Coronary artery disease Father   . Cancer Brother   . Cancer - Colon Brother        brain, Lymphoma  Reviewed on admission  Prior to Admission medications   Medication Sig Start Date End Date Taking? Authorizing Provider  acetaminophen (TYLENOL) 500 MG tablet Take 1,000 mg by mouth every 4 (four) hours as needed for moderate pain or headache.    [provider]  amiodarone (PACERONE) 200 MG tablet Take 1 tablet (200 mg total) by mouth daily. 07/28/21 08/27/21  Cantwell, Celeste C, PA-C  apixaban (ELIQUIS) 5 MG TABS tablet Take 1 tablet (5 mg total) by mouth 2 (two) times daily. Will need to see MD for future refills.  08/22/19   Camnitz, Ocie Doyne, MD  B-D UF III MINI PEN NEEDLES 31G X 5 MM MISC SMARTSIG:1 Each SUB-Q Daily 03/14/19   [provider]  clopidogrel (PLAVIX) 75 MG tablet Take 1 tablet (75 mg total) by mouth daily. 03/11/21   Cantwell, Celeste C, PA-C  DULoxetine (CYMBALTA) 60 MG capsule Take 60 mg by mouth daily. 10/16/17   [provider]  ezetimibe (ZETIA) 10 MG tablet Take 10 mg by mouth daily. 09/17/19   [provider]  Ferrous Sulfate Dried (HIGH POTENCY IRON) 65 MG TABS Take 65 mg by mouth daily.    [provider]  fesoterodine (TOVIAZ) 8 MG TB24 tablet Take 8 mg by mouth every evening.     [provider]  fluticasone (FLONASE) 50 MCG/ACT nasal spray Place 1 spray into both nostrils 2 (two) times daily as needed for allergies or rhinitis.    [provider]  FREESTYLE LITE test strip Check blood sugar every morning 04/09/19   [provider]  furosemide (LASIX) 40 MG tablet Take 1 tablet (40 mg total) by mouth daily. 07/14/21 08/13/21  Arrien, Jimmy Picket, MD  gabapentin (NEURONTIN) 300 MG capsule Take 300 mg by mouth 2 (two) times daily.    [provider]  hydrOXYzine (ATARAX/VISTARIL)  25 MG tablet Take 25-50 mg by mouth 3 (three) times daily. 25 mg in the morning 50 mg at bedtime    [provider]  Insulin Glargine (LANTUS SOLOSTAR) 100 UNIT/ML Solostar Pen Inject 25 Units into the skin daily before breakfast.    [provider]  isosorbide dinitrate (ISORDIL) 30 MG tablet TAKE 1 TABLET THREE TIMES A DAY Patient taking differently: Take 30 mg by mouth 2 (two) times daily. 07/06/21   Adrian Prows, MD  loperamide (IMODIUM A-D) 2 MG tablet Take 2 mg by mouth 4 (four) times daily as needed for diarrhea or loose stools.    [provider]  metoprolol succinate (TOPROL-XL) 25 MG 24 hr tablet Take 12.5 mg by mouth 2 (two) times daily.    [provider]  NAMZARIC 28-10 MG CP24 Take 1 capsule by mouth daily. Patient taking differently: Take 1 capsule by mouth at bedtime. 06/09/21   Melvenia Beam, MD  nitroGLYCERIN (NITROSTAT) 0.4 MG SL tablet Place 1 tablet (0.4 mg total) under the tongue every 5 (five) minutes as needed for up to 25 days for chest pain. Patient taking differently: Place 0.4 mg under the tongue every 5 (five) minutes as needed for chest pain. 05/29/20 07/07/21  Adrian Prows, MD  NON FORMULARY CPAP at bedtime    [provider]  pantoprazole (PROTONIX) 40 MG tablet Take 40 mg by mouth daily as needed (heartburn). 10/31/17   [provider]  potassium chloride SA (KLOR-CON M) 20 MEQ tablet TAKE 1 TABLET DAILY Patient taking differently: Take 20 mEq by mouth daily. 01/29/21   Adrian Prows, MD  rosuvastatin (CRESTOR) 10 MG tablet Take 10 mg by mouth daily before supper.    [provider]  sitaGLIPtin (JANUVIA) 100 MG tablet Take 100 mg by mouth daily.    [provider]  Vibegron (GEMTESA) 75 MG TABS Take 75 mg by mouth daily.    [provider]  vitamin B-12 (CYANOCOBALAMIN) 1000 MCG tablet Take 1,000 mcg by mouth at bedtime.    [provider]    Physical Exam: Vitals:   08/15/2021 2159  08/31/2021 2244 08/17/2021 2248 08/27/2021 2318  BP:  (!) 138/117  Marland Kitchen)  157/78  Pulse:  (!) 53  (!) 49  Resp:   19   Temp: 98.5 F (36.9 C) 98.7 F (37.1 C)    TempSrc: Oral Oral    SpO2:  96% 96%   Weight:      Height:        Physical Exam Constitutional:      General: She is not in acute distress.    Appearance: Normal appearance.  HENT:     Head: Normocephalic and atraumatic.     Mouth/Throat:     Mouth: Mucous membranes are moist.     Pharynx: Oropharynx is clear.  Eyes:     Extraocular Movements: Extraocular movements intact.     Pupils: Pupils are equal, round, and reactive to light.  Cardiovascular:     Rate and Rhythm: Normal rate and regular rhythm.     Pulses: Normal pulses.     Heart sounds: Normal heart sounds.  Pulmonary:     Effort: Pulmonary effort is normal. No respiratory distress.     Breath sounds: Rales (Trace) present.  Abdominal:     General: Bowel sounds are normal. There is no distension.     Palpations: Abdomen is soft.     Tenderness: There is no abdominal tenderness.  Musculoskeletal:        General: No swelling or deformity.  Skin:    General: Skin is warm and dry.  Neurological:     General: No focal deficit present.     Mental Status: Mental status is at baseline.    Labs on Admission: I have personally reviewed following labs and imaging studies  CBC: Recent Labs  Lab 08/23/2021 1250  WBC 12.3*  HGB 7.8*  HCT 25.6*  MCV 85.6  PLT 735    Basic Metabolic Panel: Recent Labs  Lab 09/05/2021 1250  NA 140  K 4.2  CL 103  CO2 27  GLUCOSE 149*  BUN 32*  CREATININE 2.02*  CALCIUM 9.0    GFR: Estimated Creatinine Clearance: 17.6 mL/min (A) (by C-G formula based on SCr of 2.02 mg/dL (H)).  Liver Function Tests: Recent Labs  Lab 08/31/2021 1250  AST 52*  ALT 32  ALKPHOS 102  BILITOT 0.9  PROT 6.6  ALBUMIN 3.3*    Urine analysis:    Component Value Date/Time   COLORURINE YELLOW 09/07/2021 [place]  09/02/2021 1636   LABSPEC 1.012 08/10/2021 1636   PHURINE 5.0 08/13/2021 1636   GLUCOSEU >1,000 (A) 09/06/2021 1636   HGBUR NEGATIVE 09/04/2021 1636   BILIRUBINUR NEGATIVE 09/06/2021 1636   KETONESUR NEGATIVE 09/07/2021 1636   PROTEINUR 30 (A) 09/05/2021 1636   UROBILINOGEN 1.0 09/18/2019 1903   NITRITE NEGATIVE 08/18/2021 1636   LEUKOCYTESUR NEGATIVE 09/08/2021 1636    Radiological Exams on Admission: DG Chest Port 1 View  Result Date: 08/31/2021 CLINICAL DATA:  A female at age 38 presents for assessment of altered mental status/delirium. EXAM: PORTABLE CHEST 1 VIEW COMPARISON:  July 09, 2021 FINDINGS: EKG leads project over the chest. Cardiac loop recorder over the LEFT chest.  Post median sternotomy. Trachea midline. Cardiomediastinal contours remain enlarged. Fullness of RIGHT and LEFT hilum. Increasing opacities throughout the chest. Persistent opacity in the LEFT mid chest but with numerous opacities throughout the LEFT and RIGHT chest compatible with multifocal process. No pneumothorax. On limited assessment there is no acute skeletal finding. IMPRESSION: 1. Findings most suggestive of worsening of multifocal pneumonia. Continued worsening of multifocal airspace disease is noted on today's  study. Nodular appearance is apparent. Close follow-up is suggested with correlation with any symptoms of infection. Given nodular appearance and continued progression, CT may be helpful for further evaluation. Electronically Signed   By: Zetta Bills M.D.   On: 09/04/2021 13:50   CT Head Wo Contrast  Result Date: 08/11/2021 CLINICAL DATA:  Mental status changes.  Unknown cause.  Delirium. EXAM: CT HEAD WITHOUT CONTRAST TECHNIQUE: Contiguous axial images were obtained from the base of the skull through the vertex without intravenous contrast. RADIATION DOSE REDUCTION: This exam was performed according to the departmental dose-optimization program which includes automated exposure control, adjustment of  the mA and/or kV according to patient size and/or use of iterative reconstruction technique. COMPARISON:  CT head without and contrast 08/23/2018 FINDINGS: Brain: No acute infarct, hemorrhage, or mass lesion is present. Moderate atrophy and white matter changes are stable. A remote lacunar infarct in the posterior right cerebellum is new from the prior exam but does not appear acute. The ventricles are of normal size. No significant extraaxial fluid collection is present. Vascular: Atherosclerotic calcifications are present within the cavernous internal carotid arteries at the dural margin of both vertebral arteries. No hyperdense vessel is present. Skull: Calvarium is intact. No focal lytic or blastic lesions are present. No significant extracranial soft tissue lesion is present. Sinuses/Orbits: The paranasal sinuses and mastoid air cells are clear. Bilateral lens replacements are noted. Globes and orbits are otherwise unremarkable. IMPRESSION: 1. No acute intracranial abnormality or significant interval change. 2. Stable atrophy and white matter disease. This likely reflects the sequela of chronic microvascular ischemia. 3. 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 Electronically Signed   By: San Morelle M.D.   On: 08/25/2021 13:47    EKG: Independently reviewed.  Sinus rhythm at 56 bpm.  Right bundle branch block.  Wide QRS.  Similar morphology to previous but rate slower.  QTc 490.  Assessment/Plan Principal Problem:   Multifocal pneumonia Active Problems:   H/O: stroke: Left occipital stroke and vision loss   Diabetic peripheral neuropathy (HCC)   Essential hypertension   Paroxysmal atrial fibrillation (HCC)   GERD (gastroesophageal reflux disease)   Type 2 diabetes mellitus with hyperlipidemia (HCC)   OSA on CPAP   Obesity (BMI 30-39.9)   Acute kidney injury superimposed on chronic kidney disease ([place])   Coronary  artery disease without angina pectoris   H/O three vessel coronary artery bypass   Carotid stenosis, asymptomatic, bilateral   Acute on chronic diastolic heart failure (HCC)   Chronic anemia   Acute respiratory failure with hypoxia (HCC)   Multifocal pneumonia Acute encephalopathy > Patient presenting with general Caryl Comes x5 days per daughter with weakness and confusion. > Found to have evidence of multifocal pneumonia on chest x-ray with leukocytosis 12.3 and new oxygen requirement. > No evidence of other infectious etiology with urinalysis negative. > Initial lactic acid normal followed by mildly elevated at 2.3 followed by improved to 2.2.  Received a liter of fluids in the ED. > Started on ceftriaxone and azithromycin in the ED. - Monitor on telemetry - Continue with ceftriaxone and azithromycin - Continue supplemental oxygen, wean as tolerated - Trend fever curve and WBC - Follow-up blood cultures  Acute respiratory failure with hypoxia Chronic systolic CHF > Patient presenting with acute hypoxic respiratory failure requiring 2 L to maintain saturations.  Likely multifactorial in the setting of pneumonia as above and degree of CHF  exacerbation. > Details of pneumonia are above.  Noted to have evidence of possible mild CHF exacerbation with BNP elevated to 417 and exam with trace rales but no significant LE edema. > Last echo was 2 months ago with EF 35-40%, indeterminate diastolic function, normal RV function. > No Lasix given initially and in fact fluids were given due to elevated lactic acid.  We will hold off on initial IV Lasix overnight as we allow treatment to begin for infection. - Monitor on telemetry - Supplemental oxygen as needed, wean as tolerated - Strict I's and O's, daily weights - Hold off on repeat echocardiogram as one was done 2 months ago - Start IV Lasix tomorrow morning - Check magnesium - Trend renal function and electrolytes - Continue home isosorbide and  metoprolol   Acute on chronic anemia > Noted to have hemoglobin of 7.8 down from baseline of 10 in the ED. > FOBT was negative.  No overt signs of bleeding. - We will hold blood thinner overnight in case there is any none overt bleeding occurring. - Trend hemoglobin overnight - Check iron, ferritin, reticulocytes given low normal MCV - Type and screen  AKI on CKD > Creatinine elevated to 2.02 from baseline 1.4.  Unclear if this represents dehydration versus volume overload given mild lactic acid elevation as above but also having elevated BNP. > We will monitor response to initial IV fluids given in the ED but plan to start diuretic tomorrow. - Trend renal function and electrolytes - Avoid nephrotoxic agents when able  Carotid artery disease CAD status post CABG History of stroke - Continue home Plavix, Zetia, rosuvastatin - Continue home isosorbide, and metoprolol holding home metoprolol  Hypertension  - Receiving Lasix tomorrow as above - Holding home metoprolol  Atrial fibrillation - Continue home amiodarone and metoprolol - Holding home Eliquis in the setting of anemia work-up ongoing   Diabetes > Chart lists 24 units daily. > 12U long acting Daily - SSI  OSA - Continue home CPAP  Mild cognitive impairment/dementia - Continue home donepezil/memantine Combination pill  GERD - Continue home PPI   Obesity - Noted  DVT prophylaxis: SCDs Code Status:   Full Family Communication:  Daughter updated by phone, she would like to be updated with any other significant changes.  Disposition Plan:   Patient is from:  Home  Anticipated DC to:  Home  Anticipated DC date:  2 to 5 days  Anticipated DC barriers: None  Consults called:  None Admission status:  Inpatient, telemetry  Severity of Illness: The appropriate patient status for this patient is INPATIENT. Inpatient status is judged to be reasonable and necessary in order to provide the required intensity of service  to ensure the patient's safety. The patient's presenting symptoms, physical exam findings, and initial radiographic and laboratory data in the context of their chronic comorbidities is felt to place them at high risk for further clinical deterioration. Furthermore, it is not anticipated that the patient will be medically stable for discharge from the hospital within 2 midnights of admission.   * I certify that at the point of admission it is my clinical judgment that the patient will require inpatient hospital care spanning beyond 2 midnights from the point of admission due to high intensity of service, high risk for further deterioration and high frequency of surveillance required.Marcelyn Bruins MD [facility]  How to contact the [facility] Attending or Consulting provider [place] or covering provider during after  hours 7P -7A, for this patient?   Check the care team in [facility] and look for a) attending/consulting [facility] provider listed and b) the [facility] team listed Log into [email or web address] and use [place]'s universal password to access. If you do not have the password, please contact the hospital operator. Locate the [facility] provider you are looking for under [facility] and page to a number that you can be directly reached. If you still have difficulty reaching the provider, please page the [facility] (Director on Call) for the Hospitalists listed on amion for assistance.  09/05/2021, 11:40 PM

## 2021-09-04 NOTE — Progress Notes (Signed)
PHARMACY - PHYSICIAN COMMUNICATION CRITICAL VALUE ALERT - BLOOD CULTURE IDENTIFICATION (BCID)  Linda Oliver is an 88 y.o. female who presented to [facility] on 08/10/2021 with a chief complaint of AMS.  Admitted with multifocal pneumonia and acute encephalopathy.    Assessment:   Found to have evidence of multifocal pneumonia on chest x-ray with leukocytosis and new oxygen requirement.  No evidence of other infectious etiology with urinalysis negative.  8/25 BCx:  1 (anaerobic) out of 4 bottles positive with gram negative rods; BCID detected Ecoli with no resistance.  Name of physician (or Provider) Contacted: Hal Hope  Current antibiotics:  Ceftriaxone 2 g IV every 24 hours  Changes to prescribed antibiotics recommended:  Patient is on recommended antibiotics - No changes needed  Results for orders placed or performed during the hospital encounter of 09/09/2021  Blood Culture ID Panel (Reflexed) (Collected: 09/09/2021  3:00 PM)  Result Value Ref Range   Enterococcus faecalis NOT DETECTED NOT DETECTED   Enterococcus Faecium NOT DETECTED NOT DETECTED   Listeria monocytogenes NOT DETECTED NOT DETECTED   Staphylococcus species NOT DETECTED NOT DETECTED   Staphylococcus aureus (BCID) NOT DETECTED NOT DETECTED   Staphylococcus epidermidis NOT DETECTED NOT DETECTED   Staphylococcus lugdunensis NOT DETECTED NOT DETECTED   Streptococcus species NOT DETECTED NOT DETECTED   Streptococcus agalactiae NOT DETECTED NOT DETECTED   Streptococcus pneumoniae NOT DETECTED NOT DETECTED   Streptococcus pyogenes NOT DETECTED NOT DETECTED   A.calcoaceticus-baumannii NOT DETECTED NOT DETECTED   Bacteroides fragilis NOT DETECTED NOT DETECTED   Enterobacterales DETECTED (A) NOT DETECTED   Enterobacter cloacae complex NOT DETECTED NOT DETECTED   Escherichia coli DETECTED (A) NOT DETECTED   Klebsiella aerogenes NOT DETECTED NOT DETECTED   Klebsiella oxytoca NOT DETECTED NOT DETECTED   Klebsiella pneumoniae  NOT DETECTED NOT DETECTED   Proteus species NOT DETECTED NOT DETECTED   Salmonella species NOT DETECTED NOT DETECTED   Serratia marcescens NOT DETECTED NOT DETECTED   Haemophilus influenzae NOT DETECTED NOT DETECTED   Neisseria meningitidis NOT DETECTED NOT DETECTED   Pseudomonas aeruginosa NOT DETECTED NOT DETECTED   Stenotrophomonas maltophilia NOT DETECTED NOT DETECTED   Candida albicans NOT DETECTED NOT DETECTED   Candida auris NOT DETECTED NOT DETECTED   Candida glabrata NOT DETECTED NOT DETECTED   Candida krusei NOT DETECTED NOT DETECTED   Candida parapsilosis NOT DETECTED NOT DETECTED   Candida tropicalis NOT DETECTED NOT DETECTED   Cryptococcus neoformans/gattii NOT DETECTED NOT DETECTED   CTX-M ESBL NOT DETECTED NOT DETECTED   Carbapenem resistance IMP NOT DETECTED NOT DETECTED   Carbapenem resistance KPC NOT DETECTED NOT DETECTED   Carbapenem resistance NDM NOT DETECTED NOT DETECTED   Carbapenem resist OXA 48 LIKE NOT DETECTED NOT DETECTED   Carbapenem resistance VIM NOT DETECTED NOT DETECTED    Suzzanne Cloud, PharmD, BCPS 09/04/2021  6:38 AM

## 2021-09-04 NOTE — Progress Notes (Signed)
PROGRESS NOTE    Linda Oliver  FTD:[id number] DOB: Jul 14, 1932 DOA: 08/18/2021 PCP: Shon Baton, MD   Brief Narrative:  HPI per Dr. Neva Seat on 09/02/2021  HPI: Linda Oliver is a 88 y.o. female with medical history significant of diabetes, neuropathy, GERD, obesity, carotid artery disease, CAD status post CABG, CVA, CHF, hypertension, atrial fibrillation, OSA on CPAP, mild cognitive impairment, anemia presenting with altered mental status.   Patient presenting with encephalopathy so history obtained with assistance of daughter and chart review.  Patient currently lives with her daughter who has noticed a decline in the past 5 days.  Patient now altered and weak.  Daughter reported patient has been talking to people who are not there and has been mumbling a lot.  She also has been less communicative with her daughter compared to her baseline.  She is had decreased p.o. intake and decreased urination.  She has slid out of her bed a couple of times as she does not seem to have the strength to stand/walk.  Patient was taken to their PCP yesterday but they were unable to get blood work or urine there and patient was started on Keflex in case she was developing UTI.   Unable to obtain full review of systems due to patient's encephalopathy/dementia.   ED Course: Vital signs in the ED significant for fever to 100.5, heart rate in the 50s to 60s, blood pressure in the 062B to 762G systolic, respiratory rate in the teens to 20s requiring 2 L to maintain saturations and desaturating to upper 80s on room air.  Lab work-up included CMP with BUN of 32, creatinine 2.02 from baseline of 1.4, glucose 149, albumin 3.3, AST 52.  CBC with leukocytosis to 12.3, hemoglobin 7.8 down from baseline of around 10 with low normal MCV.  PT and INR elevated at 35.1 and 3.5 respectively.  BNP elevated to 417.  FOBT negative.  Lactic acid normal, 2.3, 2.2 on repeat's.  Respiratory panel flu and COVID-negative.  Urinalysis with  glucose and protein only.  Blood cultures pending.  CT head showed no acute abnormality.  Chest x-ray showed findings consistent with increasing multifocal pneumonia.  Patient received ceftriaxone azithromycin in the ED as well as Tylenol and a liter of fluids.  **Interim History The work-up reveals that she has an E. coli bacteremia and will obtain a CT of the chest without contrast to further evaluate for her pneumonia.  She is improving and something that she is about 25% better.  Assessment and Plan:  E. coli bacteremia Multifocal pneumonia Acute encephalopathy > Patient presenting with general decline x5 days per daughter with weakness and confusion. > Found to have evidence of multifocal pneumonia on chest x-ray with leukocytosis 12.3 and acute on chronic oxygen requirement > Cultures x2 show positive for E. coli > Initial lactic acid normal followed by mildly elevated at 2.3 followed by improved to 2.2.  Received a liter of fluids in the ED. > Started on ceftriaxone and azithromycin in the ED. - Monitor on telemetry - Continue with ceftriaxone and azithromycin - Continue supplemental oxygen, wean as tolerated - Trend fever curve and WBC - Follow-up blood cultures and repeat them if necessary -Check CT of the chest without contrast given renal dysfunction; Showed "Multifocal pneumonia without complication. Reactive nodes in the mediastinum.  Possible 3.3 cm incidental thyroid nodule. Recommend nonemergent thyroid US if clinically warranted given patient age. Calcified atherosclerotic change in the nonaneurysmal thoracic aorta. Coronary artery disease. Aortic Atherosclerosis "  Acute on Chronic respiratory failure with hypoxia Chronic systolic CHF > Patient presenting with Respiratory failure requiring 2 L to maintain saturations.  Likely multifactorial in the setting of pneumonia as above and degree of CHF exacerbation. > Details of pneumonia are above.  Noted to have evidence of  possible mild CHF exacerbation with BNP elevated to 417 and exam with trace rales but no significant LE edema. > Last echo was 2 months ago with EF 35-40%, indeterminate diastolic function, normal RV function. > No Lasix given initially and in fact fluids were given due to elevated lactic acid.  Received IV Lasix this a.m. -Start Xopenex and Atrovent - Monitor on telemetry - Supplemental oxygen as needed, wean as tolerated - Strict I's and O's, daily weights - Hold off on repeat echocardiogram as one was done 2 months ago - Strict I's and O's - Check magnesium - Trend renal function and electrolytes - Continue home isosorbide and metoprolol    Acute on chronic anemia > Noted to have hemoglobin of 7.8 down from baseline of 10 in the ED. > FOBT was negative.  No overt signs of bleeding. - We will hold blood thinner overnight in case there is any none overt bleeding occurring. - Trend hemoglobin overnight -Iron level was 31, U IBC 02/10/1990, TIBC 223, saturation ratios of 14%, ferritin level of 69 - Type and screen   AKI on CKD Stage 3b > Creatinine elevated to 2.02 from baseline 1.4.  Unclear if this represents dehydration versus volume overload given mild lactic acid elevation as above but also having elevated BNP. > Patient's BUNs/creatinine went from 32/2.02 is now 34/2.05 -Received IV Lasix this morning - Trend renal function and electrolytes -Avoid further nephrotoxic medications, contrast dyes, hypotension and dehydration to ensure adequate renal perfusion and renally dose medications -Repeat CMP in a.m.   Carotid artery disease CAD status post CABG History of stroke - Continue home Plavix, Zetia, rosuvastatin - Continue home isosorbide, and metoprolol holding home metoprolol -Continue to monitor and trend   Hypertension  -Received 40 mg of IV Lasix today -Resume home metoprolol succinate 12.5 TWICE DAILY  Abnormal LFTs -AST went from 52 and is now 124 and ALT went from 32  is now 60 -In the setting of above -Continue monitor and trend and repeat CMP in a.m. and if necessary obtain a right upper quadrant ultrasound as well as acute hepatitis panel   Atrial fibrillation - Continue home amiodarone and metoprolol - Holding home Eliquis in the setting of anemia work-up ongoing    Normocytic anemia -Patient's hemoglobin/hematocrit went from 7.8/25.6 is now 8.2/27.7 -Check anemia panel in the a.m. -Continue to monitor for signs and symptoms bleeding; no overt bleeding noted For repeat CBC in a.m.   Diabetes mellitus type II > Chart lists 24 units daily. > 12U long acting Daily - SSI   OSA - Continue home CPAP   Mild cognitive impairment/dementia - Continue home donepezil/memantine Combination pill   GERD - Continue home PPI   Obesity -Complicates overall prognosis and care -Estimated body mass index is 30.87 kg/m as calculated from the following:   Height as of this encounter: 5' (1.524 m).   Weight as of this encounter: 71.7 kg.  -Weight Loss and Dietary Counseling given  DVT prophylaxis: SCDs Start: 09/02/2021 2332    Code Status: Full Code Family Communication: Discussed with family at bedside  Disposition Plan:  Level of care: Telemetry Status is: Inpatient Remains inpatient appropriate because: Needs further clinical work-up  and improvement   Consultants:  None  Procedures:  None  Antimicrobials:  Anti-infectives (From admission, onward)    Start     Dose/Rate Route Frequency Ordered Stop   09/04/21 1400  azithromycin (ZITHROMAX) 500 mg in sodium chloride 0.9 % 250 mL IVPB  Status:  Discontinued        500 mg 250 mL/hr over 60 Minutes Intravenous Every 24 hours 09/04/21 1126 09/04/21 1129   09/04/21 1400  cefTRIAXone (ROCEPHIN) 2 g in sodium chloride 0.9 % 100 mL IVPB        2 g 200 mL/hr over 30 Minutes Intravenous Every 24 hours 09/04/21 1128     09/04/21 1215  azithromycin (ZITHROMAX) tablet 500 mg        500 mg Oral Daily  09/04/21 1129 09/08/21 0959   09/09/2021 1445  cefTRIAXone (ROCEPHIN) 2 g in sodium chloride 0.9 % 100 mL IVPB  Status:  Discontinued        2 g 200 mL/hr over 30 Minutes Intravenous Every 24 hours 09/02/2021 1440 09/04/21 1128   08/28/2021 1445  azithromycin (ZITHROMAX) 500 mg in sodium chloride 0.9 % 250 mL IVPB  Status:  Discontinued        500 mg 250 mL/hr over 60 Minutes Intravenous Every 24 hours 08/10/2021 1440 09/04/21 1126        Subjective: Seen and examined at bedside and son thinks that she is a little bit better and she is about 25% better.  She denies any current chest pain and states that she has had some pain in her elbows where she fell.  Denies any lightheadedness or dizziness.  No other concerns or complaints at this time.  Objective: Vitals:   09/04/21 1453 09/04/21 1541 09/04/21 1607 09/04/21 1709  BP:  (!) 176/55 (!) 187/51 (!) 195/52  Pulse:  74 67 80  Resp:  (!) 27 (!) 30 (!) 30  Temp:  100.1 F (37.8 C)  99.5 F (37.5 C)  TempSrc:  Oral  Oral  SpO2: 95% 95% 94% 94%  Weight:      Height:        Intake/Output Summary (Last 24 hours) at 09/04/2021 1918 Last data filed at 09/04/2021 1300 Gross per 24 hour  Intake 502.47 ml  Output 1400 ml  Net -897.53 ml   Filed Weights   08/25/2021 1241 09/04/21 0500  Weight: 71.6 kg 71.7 kg   Examination: Physical Exam:  Constitutional: WN/WD chronically ill-appearing Caucasian elderly female in no acute distress ses, normal ROM, no appreciable thyromegaly Respiratory: Diminished to auscultation bilaterally, no wheezing, but some slight rhonchi and crackles. Normal respiratory effort and patient is not tachypenic. No accessory muscle use.  Unlabored breathing but when 2 L of supplemental oxygen Cardiovascular: RRR, no murmurs / rubs / gallops. S1 and S2 auscultated.  Extremity edema Abdomen: Soft, non-tender, s distal secondary body habitus.  Bowel sounds positive.  GU: Deferred. Musculoskeletal: No clubbing / cyanosis of  digits/nails. No joint deformity upper and lower extremities.  Skin: Some skin tears on her elbows Neurologic: CN 2-12 grossly intact with no focal deficits. Romberg sign and cerebellar reflexes not assessed.  Psychiatric: Normal judgment and insight. Alert and oriented x 3.   Data Reviewed: I have personally reviewed following labs and imaging studies  CBC: Recent Labs  Lab 08/13/2021 1250 09/04/21 0000 09/04/21 0515  WBC 12.3* 12.9* 13.3*  HGB 7.8* 7.3* 8.2*  HCT 25.6* 24.9* 27.7*  MCV 85.6 88.0 87.9  PLT 312 278 244  Basic Metabolic Panel: Recent Labs  Lab 09/07/2021 1250 09/04/21 0000 09/04/21 0515  NA 140  --  142  K 4.2  --  4.0  CL 103  --  108  CO2 27  --  25  GLUCOSE 149*  --  58*  BUN 32*  --  34*  CREATININE 2.02*  --  2.05*  CALCIUM 9.0  --  8.5*  MG  --  2.1  --    GFR: Estimated Creatinine Clearance: 17.4 mL/min (A) (by C-G formula based on SCr of 2.05 mg/dL (H)). Liver Function Tests: Recent Labs  Lab 09/07/2021 1250 09/04/21 0515  AST 52* 124*  ALT 32 60*  ALKPHOS 102 108  BILITOT 0.9 0.9  PROT 6.6 6.2*  ALBUMIN 3.3* 2.6*   No results for input(s): "LIPASE", "AMYLASE" in the last 168 hours. No results for input(s): "AMMONIA" in the last 168 hours. Coagulation Profile: Recent Labs  Lab 08/13/2021 1250  INR 3.5*   Cardiac Enzymes: No results for input(s): "CKTOTAL", "CKMB", "CKMBINDEX", "TROPONINI" in the last 168 hours. BNP (last 3 results) No results for input(s): "PROBNP" in the last 8760 hours. HbA1C: No results for input(s): "HGBA1C" in the last 72 hours. CBG: Recent Labs  Lab 09/04/21 0745 09/04/21 0821 09/04/21 1301 09/04/21 1719  GLUCAP 43* 73 186* 188*   Lipid Profile: No results for input(s): "CHOL", "HDL", "LDLCALC", "TRIG", "CHOLHDL", "LDLDIRECT" in the last 72 hours. Thyroid Function Tests: No results for input(s): "TSH", "T4TOTAL", "FREET4", "T3FREE", "THYROIDAB" in the last 72 hours. Anemia Panel: Recent Labs     09/04/21 0000  FERRITIN 69  TIBC 223*  IRON 31  RETICCTPCT 3.4*   Sepsis Labs: Recent Labs  Lab 08/16/2021 1435 09/02/2021 1636 09/02/2021 1930  LATICACIDVEN 1.4 2.3* 2.2*    Recent Results (from the past 240 hour(s))  Culture, blood (routine x 2)     Status: None (Preliminary result)   Collection Time: 08/20/2021  3:00 PM   Specimen: BLOOD LEFT ARM  Result Value Ref Range Status   Specimen Description   Final    BLOOD LEFT ARM Performed at [facility], [address], [place], [place] [address]    Special Requests   Final    BOTTLES DRAWN AEROBIC AND ANAEROBIC Blood Culture adequate volume Performed at [facility], [address] [place], [place] [address]    Culture  Setup Time   Final    GRAM NEGATIVE RODS ANAEROBIC BOTTLE ONLY CRITICAL RESULT CALLED TO, READ BACK BY AND VERIFIED WITH: C/ PHARMD MJeneen Rinks 09/04/21 0628 A. LAFRANCE Performed at [facility], [place] [address] [place], [place] [address]    Culture GRAM NEGATIVE RODS  Final   Report Status PENDING  Incomplete  Blood Culture ID Panel (Reflexed)     Status: Abnormal   Collection Time: 08/20/2021  3:00 PM  Result Value Ref Range Status   Enterococcus faecalis NOT DETECTED NOT DETECTED Final   Enterococcus Faecium NOT DETECTED NOT DETECTED Final   Listeria monocytogenes NOT DETECTED NOT DETECTED Final   Staphylococcus species NOT DETECTED NOT DETECTED Final   Staphylococcus aureus (BCID) NOT DETECTED NOT DETECTED Final   Staphylococcus epidermidis NOT DETECTED NOT DETECTED Final   Staphylococcus lugdunensis NOT DETECTED NOT DETECTED Final   Streptococcus species NOT DETECTED NOT DETECTED Final   Streptococcus agalactiae NOT DETECTED NOT DETECTED Final   Streptococcus pneumoniae NOT DETECTED NOT DETECTED Final   Streptococcus pyogenes NOT DETECTED NOT DETECTED Final   A.calcoaceticus-baumannii  NOT DETECTED NOT DETECTED Final   Bacteroides fragilis NOT DETECTED NOT  DETECTED Final   Enterobacterales DETECTED (A) NOT DETECTED Final    Comment: Enterobacterales represent a large order of gram negative bacteria, not a single organism. CRITICAL RESULT CALLED TO, READ BACK BY AND VERIFIED WITH: C/ PHARMD M. JAMES 09/04/21 0628 A. LAFRANCE    Enterobacter cloacae complex NOT DETECTED NOT DETECTED Final   Escherichia coli DETECTED (A) NOT DETECTED Final    Comment: CRITICAL RESULT CALLED TO, READ BACK BY AND VERIFIED WITH: C/ PHARMD M. JAMES 09/04/21 0628 A. LAFRANCE    Klebsiella aerogenes NOT DETECTED NOT DETECTED Final   Klebsiella oxytoca NOT DETECTED NOT DETECTED Final   Klebsiella pneumoniae NOT DETECTED NOT DETECTED Final   Proteus species NOT DETECTED NOT DETECTED Final   Salmonella species NOT DETECTED NOT DETECTED Final   Serratia marcescens NOT DETECTED NOT DETECTED Final   Haemophilus influenzae NOT DETECTED NOT DETECTED Final   Neisseria meningitidis NOT DETECTED NOT DETECTED Final   Pseudomonas aeruginosa NOT DETECTED NOT DETECTED Final   Stenotrophomonas maltophilia NOT DETECTED NOT DETECTED Final   Candida albicans NOT DETECTED NOT DETECTED Final   Candida auris NOT DETECTED NOT DETECTED Final   Candida glabrata NOT DETECTED NOT DETECTED Final   Candida krusei NOT DETECTED NOT DETECTED Final   Candida parapsilosis NOT DETECTED NOT DETECTED Final   Candida tropicalis NOT DETECTED NOT DETECTED Final   Cryptococcus neoformans/gattii NOT DETECTED NOT DETECTED Final   CTX-M ESBL NOT DETECTED NOT DETECTED Final   Carbapenem resistance IMP NOT DETECTED NOT DETECTED Final   Carbapenem resistance KPC NOT DETECTED NOT DETECTED Final   Carbapenem resistance NDM NOT DETECTED NOT DETECTED Final   Carbapenem resist OXA 48 LIKE NOT DETECTED NOT DETECTED Final   Carbapenem resistance VIM NOT DETECTED NOT DETECTED Final    Comment: Performed at [facility], [address], [place], [place] [address]  Culture, blood (routine x 2)     Status:  None (Preliminary result)   Collection Time: 09/07/2021  3:24 PM   Specimen: BLOOD RIGHT WRIST  Result Value Ref Range Status   Specimen Description   Final    BLOOD RIGHT WRIST Performed at [facility], [address], [place], [place] [address]    Special Requests   Final    BOTTLES DRAWN AEROBIC AND ANAEROBIC Blood Culture adequate volume Performed at [facility], [address] [place], [place] [address]    Culture   Final    NO GROWTH < 24 HOURS Performed at [facility], [place] [address] [place], [place] [address]    Report Status PENDING  Incomplete  Resp Panel by RT-PCR (Flu A&B, Covid) Urine, Clean Catch     Status: None   Collection Time: 08/21/2021  4:36 PM   Specimen: Urine, Clean Catch; Nasal Swab  Result Value Ref Range Status   SARS Coronavirus 2 by RT PCR NEGATIVE NEGATIVE Final    Comment: (NOTE) SARS-CoV-2 target nucleic acids are NOT DETECTED.  The SARS-CoV-2 RNA is generally detectable in upper respiratory specimens during the acute phase of infection. The lowest concentration of SARS-CoV-2 viral copies this assay can detect is 138 copies/mL. A negative result does not preclude SARS-Cov-2 infection and should not be used as the sole basis for treatment or other patient management decisions. A negative result may occur with  improper specimen collection/handling, submission of specimen other than nasopharyngeal swab, presence of viral mutation(s) within the  areas targeted by this assay, and inadequate number of viral copies(<138 copies/mL). A negative result must be combined with clinical observations, patient history, and epidemiological information. The expected result is Negative.  Fact Sheet for Patients:  EntrepreneurPulse.com.au  Fact Sheet for Healthcare Providers:  IncredibleEmployment.be  This test is no t yet approved or cleared by the [place] FDA and  has  been authorized for detection and/or diagnosis of SARS-CoV-2 by FDA under an Emergency Use Authorization (EUA). This EUA will remain  in effect (meaning this test can be used) for the duration of the COVID-19 declaration under Section 564(b)(1) of the Act, 21 U.S.C.section 360bbb-3(b)(1), unless the authorization is terminated  or revoked sooner.       Influenza A by PCR NEGATIVE NEGATIVE Final   Influenza B by PCR NEGATIVE NEGATIVE Final    Comment: (NOTE) The Xpert Xpress SARS-CoV-2/FLU/RSV plus assay is intended as an aid in the diagnosis of influenza from Nasopharyngeal swab specimens and should not be used as a sole basis for treatment. Nasal washings and aspirates are unacceptable for Xpert Xpress SARS-CoV-2/FLU/RSV testing.  Fact Sheet for Patients: EntrepreneurPulse.com.au  Fact Sheet for Healthcare Providers: IncredibleEmployment.be  This test is not yet approved or cleared by the [place] FDA and has been authorized for detection and/or diagnosis of SARS-CoV-2 by FDA under an Emergency Use Authorization (EUA). This EUA will remain in effect (meaning this test can be used) for the duration of the COVID-19 declaration under Section 564(b)(1) of the Act, 21 U.S.C. section 360bbb-3(b)(1), unless the authorization is terminated or revoked.  Performed at [facility], [address], [place], [place] [address]      Radiology Studies: CT CHEST WO CONTRAST  Result Date: 09/04/2021 CLINICAL DATA:  Pneumonia.  Suspected complication. EXAM: CT CHEST WITHOUT CONTRAST TECHNIQUE: Multidetector CT imaging of the chest was performed following the standard protocol without IV contrast. RADIATION DOSE REDUCTION: This exam was performed according to the departmental dose-optimization program which includes automated exposure control, adjustment of the mA and/or kV according to patient size and/or use of iterative  reconstruction technique. COMPARISON:  Chest x-ray September 03, 2021 FINDINGS: Cardiovascular: Calcified atherosclerotic changes identified in the nonaneurysmal aorta. Three-vessel coronary artery disease identified. The heart size is borderline. Central pulmonary arteries are unremarkable. Mediastinum/Nodes: No pleural or pericardial effusions. Shotty nodes in the mediastinum are likely reactive without gross adenopathy. Possible enlargement of the right thyroid lobe containing a calcification, asymmetric to the left. No underlying nodule measuring up to 3.3 cm is not excluded. The esophagus is normal. Lungs/Pleura: Central airways are normal. No pneumothorax. Patchy bilateral pulmonary infiltrates involve all lobes, likely multifocal pneumonia. No abscess or complication identified. Upper Abdomen: No acute abnormality. Musculoskeletal: No chest wall mass or suspicious bone lesions identified. IMPRESSION: 1. Multifocal pneumonia without complication. Reactive nodes in the mediastinum. 2. Possible 3.3 cm incidental thyroid nodule. Recommend nonemergent thyroid US if clinically warranted given patient age. Reference: J Am Coll Radiol. 2015 Feb;12(2): 143-50 3. Calcified atherosclerotic change in the nonaneurysmal thoracic aorta. Coronary artery disease. Aortic Atherosclerosis (ICD10-I70.0). Electronically Signed   By: Dorise Bullion III M.D.   On: 09/04/2021 16:21   DG Chest Port 1 View  Result Date: 08/16/2021 CLINICAL DATA:  A female at age 72 presents for assessment of altered mental status/delirium. EXAM: PORTABLE CHEST 1 VIEW COMPARISON:  July 09, 2021 FINDINGS: EKG leads project over the chest. Cardiac loop recorder over the LEFT chest.  Post median sternotomy. Trachea midline. Cardiomediastinal contours remain  enlarged. Fullness of RIGHT and LEFT hilum. Increasing opacities throughout the chest. Persistent opacity in the LEFT mid chest but with numerous opacities throughout the LEFT and RIGHT chest  compatible with multifocal process. No pneumothorax. On limited assessment there is no acute skeletal finding. IMPRESSION: 1. Findings most suggestive of worsening of multifocal pneumonia. Continued worsening of multifocal airspace disease is noted on today's study. Nodular appearance is apparent. Close follow-up is suggested with correlation with any symptoms of infection. Given nodular appearance and continued progression, CT may be helpful for further evaluation. Electronically Signed   By: Zetta Bills M.D.   On: 09/07/2021 13:50   CT Head Wo Contrast  Result Date: 08/24/2021 CLINICAL DATA:  Mental status changes.  Unknown cause.  Delirium. EXAM: CT HEAD WITHOUT CONTRAST TECHNIQUE: Contiguous axial images were obtained from the base of the skull through the vertex without intravenous contrast. RADIATION DOSE REDUCTION: This exam was performed according to the departmental dose-optimization program which includes automated exposure control, adjustment of the mA and/or kV according to patient size and/or use of iterative reconstruction technique. COMPARISON:  CT head without and contrast 08/23/2018 FINDINGS: Brain: No acute infarct, hemorrhage, or mass lesion is present. Moderate atrophy and white matter changes are stable. A remote lacunar infarct in the posterior right cerebellum is new from the prior exam but does not appear acute. The ventricles are of normal size. No significant extraaxial fluid collection is present. Vascular: Atherosclerotic calcifications are present within the cavernous internal carotid arteries at the dural margin of both vertebral arteries. No hyperdense vessel is present. Skull: Calvarium is intact. No focal lytic or blastic lesions are present. No significant extracranial soft tissue lesion is present. Sinuses/Orbits: The paranasal sinuses and mastoid air cells are clear. Bilateral lens replacements are noted. Globes and orbits are otherwise unremarkable. IMPRESSION: 1. No acute  intracranial abnormality or significant interval change. 2. Stable atrophy and white matter disease. This likely reflects the sequela of chronic microvascular ischemia. 3. 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 Electronically Signed   By: San Morelle M.D.   On: 08/14/2021 13:47    Scheduled Meds:  amiodarone  200 mg Oral Daily   azithromycin  500 mg Oral Daily   clopidogrel  75 mg Oral Daily   dextrose  2 Tube Oral STAT   memantine  28 mg Oral QHS   And   donepezil  10 mg Oral QHS   DULoxetine  60 mg Oral Daily   ezetimibe  10 mg Oral Daily   gabapentin  300 mg Oral BID   insulin aspart  0-15 Units Subcutaneous TID WC   insulin glargine-yfgn  12 Units Subcutaneous Daily   ipratropium  0.5 mg Nebulization BID   isosorbide dinitrate  30 mg Oral BID   levalbuterol  0.63 mg Nebulization BID   metoprolol succinate  12.5 mg Oral Daily   pantoprazole  40 mg Oral Daily   rosuvastatin  10 mg Oral QAC supper   sodium chloride flush  3 mL Intravenous Q12H   Continuous Infusions:  cefTRIAXone (ROCEPHIN)  IV 2 g (09/04/21 1343)    LOS: 1 day   Raiford Noble, DO [facility] Available via Epic secure chat 7am-7pm After these hours, please refer to coverage provider listed on [email or web address] 09/04/2021, 7:18 PM

## 2021-09-04 NOTE — Progress Notes (Signed)
OT Cancellation Note  Patient Details Name: Linda Oliver MRN: [id number] DOB: February 21, 1932   Cancelled Treatment:    Reason Eval/Treat Not Completed: Patient at procedure or test/ unavailable.  Naly Schwanz L Rhianne Soman 09/04/2021, 3:19 PM

## 2021-09-04 NOTE — Progress Notes (Signed)
PHARMACIST - PHYSICIAN COMMUNICATION  CONCERNING: Antibiotic IV to Oral Route Change Policy  RECOMMENDATION: This patient is receiving azithromycin by the intravenous route.  Based on criteria approved by the Pharmacy and Therapeutics Committee, the antibiotic(s) is/are being converted to the equivalent oral dose form(s).   DESCRIPTION: These criteria include:  Patient being treated for a respiratory tract infection, urinary tract infection, cellulitis or clostridium difficile associated diarrhea if on metronidazole  The patient is not neutropenic and does not exhibit a GI malabsorption state  The patient is eating (either orally or via tube) and/or has been taking other orally administered medications for a least 24 hours  The patient is improving clinically and has a Tmax < 100.5  If you have questions about this conversion, please contact the Pharmacy Department  []  ( [phone] )  [place] []  ( [phone]  [facility] []  ( [phone]  [place] []  ( [phone] )  [facility] [x]  ( [phone] )  [facility]  

## 2021-09-05 ENCOUNTER — Inpatient Hospital Stay (HOSPITAL_COMMUNITY): Payer: Medicare Other

## 2021-09-05 DIAGNOSIS — I5023 Acute on chronic systolic (congestive) heart failure: Secondary | ICD-10-CM | POA: Diagnosis not present

## 2021-09-05 DIAGNOSIS — Z4509 Encounter for adjustment and management of other cardiac device: Secondary | ICD-10-CM | POA: Diagnosis not present

## 2021-09-05 DIAGNOSIS — J9601 Acute respiratory failure with hypoxia: Secondary | ICD-10-CM | POA: Diagnosis not present

## 2021-09-05 DIAGNOSIS — I6340 Cerebral infarction due to embolism of unspecified cerebral artery: Secondary | ICD-10-CM | POA: Diagnosis not present

## 2021-09-05 DIAGNOSIS — N179 Acute kidney failure, unspecified: Secondary | ICD-10-CM | POA: Diagnosis not present

## 2021-09-05 DIAGNOSIS — I480 Paroxysmal atrial fibrillation: Secondary | ICD-10-CM | POA: Diagnosis not present

## 2021-09-05 DIAGNOSIS — J189 Pneumonia, unspecified organism: Secondary | ICD-10-CM | POA: Diagnosis not present

## 2021-09-05 DIAGNOSIS — Z95818 Presence of other cardiac implants and grafts: Secondary | ICD-10-CM | POA: Diagnosis not present

## 2021-09-05 LAB — HEPATIC FUNCTION PANEL
ALT: 68 U/L — ABNORMAL HIGH (ref 0–44)
AST: 93 U/L — ABNORMAL HIGH (ref 15–41)
Albumin: 2.5 g/dL — ABNORMAL LOW (ref 3.5–5.0)
Alkaline Phosphatase: 127 U/L — ABNORMAL HIGH (ref 38–126)
Bilirubin, Direct: 0.5 mg/dL — ABNORMAL HIGH (ref 0.0–0.2)
Indirect Bilirubin: 0.6 mg/dL (ref 0.3–0.9)
Total Bilirubin: 1.1 mg/dL (ref 0.3–1.2)
Total Protein: 6.3 g/dL — ABNORMAL LOW (ref 6.5–8.1)

## 2021-09-05 LAB — GLUCOSE, CAPILLARY
Glucose-Capillary: 98 mg/dL (ref 70–99)
Glucose-Capillary: 179 mg/dL — ABNORMAL HIGH (ref 70–99)
Glucose-Capillary: 224 mg/dL — ABNORMAL HIGH (ref 70–99)
Glucose-Capillary: 232 mg/dL — ABNORMAL HIGH (ref 70–99)
Glucose-Capillary: 239 mg/dL — ABNORMAL HIGH (ref 70–99)

## 2021-09-05 LAB — BASIC METABOLIC PANEL
Anion gap: 10 (ref 5–15)
BUN: 34 mg/dL — ABNORMAL HIGH (ref 8–23)
CO2: 30 mmol/L (ref 22–32)
Calcium: 8.9 mg/dL (ref 8.9–10.3)
Chloride: 104 mmol/L (ref 98–111)
Creatinine, Ser: 1.83 mg/dL — ABNORMAL HIGH (ref 0.44–1.00)
GFR, Estimated: 27 mL/min — ABNORMAL LOW (ref >60)
Glucose, Bld: 102 mg/dL — ABNORMAL HIGH (ref 70–99)
Potassium: 3 mmol/L — ABNORMAL LOW (ref 3.5–5.1)
Sodium: 144 mmol/L (ref 135–145)

## 2021-09-05 LAB — CBC
HCT: 29 % — ABNORMAL LOW (ref 36.0–46.0)
Hemoglobin: 8.6 g/dL — ABNORMAL LOW (ref 12.0–15.0)
MCH: 25.8 pg — ABNORMAL LOW (ref 26.0–34.0)
MCHC: 29.7 g/dL — ABNORMAL LOW (ref 30.0–36.0)
MCV: 87.1 fL (ref 80.0–100.0)
Platelets: 359 10*3/uL (ref 150–400)
RBC: 3.33 MIL/uL — ABNORMAL LOW (ref 3.87–5.11)
RDW: 17.2 % — ABNORMAL HIGH (ref 11.5–15.5)
WBC: 18.7 10*3/uL — ABNORMAL HIGH (ref 4.0–10.5)
nRBC: 0.2 % (ref 0.0–0.2)

## 2021-09-05 LAB — MAGNESIUM: Magnesium: 2 mg/dL (ref 1.7–2.4)

## 2021-09-05 LAB — BRAIN NATRIURETIC PEPTIDE: B Natriuretic Peptide: 849.9 pg/mL — ABNORMAL HIGH (ref 0.0–100.0)

## 2021-09-05 LAB — PHOSPHORUS: Phosphorus: 3.6 mg/dL (ref 2.5–4.6)

## 2021-09-05 MED ORDER — ARFORMOTEROL TARTRATE 15 MCG/2ML IN NEBU
15.0000 ug | INHALATION_SOLUTION | Freq: Two times a day (BID) | RESPIRATORY_TRACT | Status: DC
  Administered 2021-09-05 – 2021-09-09 (×9): 15 ug via RESPIRATORY_TRACT
  Filled 2021-09-05 (×10): qty 2

## 2021-09-05 MED ORDER — LEVALBUTEROL HCL 0.63 MG/3ML IN NEBU
0.6300 mg | INHALATION_SOLUTION | RESPIRATORY_TRACT | Status: DC | PRN
  Administered 2021-09-05 – 2021-09-09 (×3): 0.63 mg via RESPIRATORY_TRACT
  Filled 2021-09-05 (×3): qty 3

## 2021-09-05 MED ORDER — FUROSEMIDE 10 MG/ML IJ SOLN
40.0000 mg | Freq: Once | INTRAMUSCULAR | Status: AC
  Administered 2021-09-05: 40 mg via INTRAVENOUS
  Filled 2021-09-05: qty 4

## 2021-09-05 MED ORDER — METHYLPREDNISOLONE SODIUM SUCC 40 MG IJ SOLR
40.0000 mg | Freq: Two times a day (BID) | INTRAMUSCULAR | Status: DC
  Administered 2021-09-05 – 2021-09-07 (×5): 40 mg via INTRAVENOUS
  Filled 2021-09-05 (×5): qty 1

## 2021-09-05 MED ORDER — BUDESONIDE 0.25 MG/2ML IN SUSP
0.2500 mg | Freq: Two times a day (BID) | RESPIRATORY_TRACT | Status: DC
Start: 2021-09-05 — End: 2021-09-09
  Administered 2021-09-05 – 2021-09-09 (×9): 0.25 mg via RESPIRATORY_TRACT
  Filled 2021-09-05 (×10): qty 2

## 2021-09-05 MED ORDER — POTASSIUM CHLORIDE CRYS ER 20 MEQ PO TBCR
40.0000 meq | EXTENDED_RELEASE_TABLET | Freq: Two times a day (BID) | ORAL | Status: AC
Start: 2021-09-05 — End: 2021-09-05
  Administered 2021-09-05 (×2): 40 meq via ORAL
  Filled 2021-09-05 (×2): qty 2

## 2021-09-05 NOTE — Evaluation (Signed)
Physical Therapy Evaluation Patient Details Name: Linda Oliver MRN: [id number] DOB: 28-Jun-1932 Today's Date: 09/05/2021  History of Present Illness  88 y.o. female with medical history significant of diabetes, neuropathy, GERD, obesity, carotid artery disease, CAD status post CABG, CVA, CHF, hypertension, atrial fibrillation, OSA on CPAP, mild cognitive impairment, anemia presenting with altered mental status. The work-up reveals that she has an E. coli bacteremia and will obtain a CT of the chest without contrast to further evaluate for her pneumonia.  Clinical Impression  On eval, pt required Min A for mobility. She was able to stand, pivot to bsc, then take a few steps from bsc to [facility] with RW. Remained on [place] O2 during session. Dyspnea 2/4 with activity. No family was present during session. Recommend HHPT after discharge. Will plan to follow pt during this hospital stay.        Recommendations for follow up therapy are one component of a multi-disciplinary discharge planning process, led by the attending physician.  Recommendations may be updated based on patient status, additional functional criteria and insurance authorization.  Follow Up Recommendations Home health PT      Assistance Recommended at Discharge Frequent or constant Supervision/Assistance  Patient can return home with the following  A little help with walking and/or transfers;A little help with bathing/dressing/bathroom;Assistance with cooking/housework;Assist for transportation;Help with stairs or ramp for entrance    Equipment Recommendations None recommended by PT  Recommendations for Other Services       Functional Status Assessment Patient has had a recent decline in their functional status and demonstrates the ability to make significant improvements in function in a reasonable and predictable amount of time.     Precautions / Restrictions Precautions Precautions: Fall Restrictions Weight Bearing Restrictions:  No      Mobility  Bed Mobility Overal bed mobility: Needs Assistance Bed Mobility: Supine to Sit, Sit to Supine     Supine to sit: Supervision, HOB elevated Sit to supine: Supervision, HOB elevated   General bed mobility comments: increased time. cues provided.    Transfers Overall transfer level: Needs assistance Equipment used: Rolling walker (2 wheels) Transfers: Sit to/from Stand Sit to Stand: Min assist           General transfer comment: Min A to rise, steady, control descent. Cues for safety, technique, hand placement.    Ambulation/Gait Min Assist   Gait Distance (Feet): 3 Feet Assistive device: Rolling walker (2 wheels)         General Gait Details: Pt took a few steps from bsc to [facility] with RW. Assist to steady for safety.  Stairs            Wheelchair Mobility    Modified Rankin (Stroke Patients Only)       Balance Overall balance assessment: Needs assistance         Standing balance support: Bilateral upper extremity supported Standing balance-Leahy Scale: Poor                               Pertinent Vitals/Pain Pain Assessment Pain Assessment: No/denies pain    Home Living Family/patient expects to be discharged to:: Private residence Living Arrangements: Children Available Help at Discharge: Family;Available 24 hours/day Type of Home: House Home Access: Stairs to enter Entrance Stairs-Rails: Can reach both Entrance Stairs-Number of Steps: 3   Home Layout: Two level;Able to live on main level with bedroom/bathroom Home Equipment: Rollator (4 wheels);Cane - quad;Shower  seat;Other (comment);BSC/3in1 Additional Comments: Has an apartment built onto her daughters home    Prior Function Prior Level of Function : Needs assist             Mobility Comments: uses a rollator ADLs Comments: Performs ADLs. Daughter assists with IADLs - has required increased assistance this last week     Hand Dominance    Dominant Hand: Right    Extremity/Trunk Assessment   Upper Extremity Assessment Upper Extremity Assessment: Defer to OT evaluation    Lower Extremity Assessment Lower Extremity Assessment: Generalized weakness    Cervical / Trunk Assessment Cervical / Trunk Assessment: Kyphotic  Communication   Communication: HOH  Cognition Arousal/Alertness: Awake/alert Behavior During Therapy: WFL for tasks assessed/performed Overall Cognitive Status: History of cognitive impairments - at baseline                                          General Comments      Exercises     Assessment/Plan    PT Assessment Patient needs continued PT services  PT Problem List Decreased strength;Decreased mobility;Decreased activity tolerance;Decreased balance;Decreased knowledge of use of DME;Decreased cognition;Decreased safety awareness       PT Treatment Interventions DME instruction;Therapeutic activities;Gait training;Therapeutic exercise;Patient/family education;Balance training;Functional mobility training    PT Goals (Current goals can be found in the Care Plan section)  Acute Rehab PT Goals Patient Stated Goal: none stated. no family present PT Goal Formulation: With patient Time For Goal Achievement: 09/19/21 Potential to Achieve Goals: Fair    Frequency Min 3X/week     Co-evaluation               AM-PAC PT "6 Clicks" Mobility  Outcome Measure Help needed turning from your back to your side while in a flat bed without using bedrails?: A Little Help needed moving from lying on your back to sitting on the side of a flat bed without using bedrails?: A Little Help needed moving to and from a bed to a chair (including a wheelchair)?: A Little Help needed standing up from a chair using your arms (e.g., wheelchair or bedside chair)?: A Little Help needed to walk in hospital room?: A Little Help needed climbing 3-5 steps with a railing? : A Lot 6 Click Score: 17     End of Session Equipment Utilized During Treatment: Oxygen Activity Tolerance: Patient tolerated treatment well Patient left: in bed;with call bell/phone within reach;with bed alarm set   PT Visit Diagnosis: Muscle weakness (generalized) (M62.81);Difficulty in walking, not elsewhere classified (R26.2)    Time: 1572-6203 PT Time Calculation (min) (ACUTE ONLY): 13 min   Charges:   PT Evaluation $PT Eval Moderate Complexity: [place], PT Acute Rehabilitation  Office: [phone] Pager: [phone]

## 2021-09-05 NOTE — Progress Notes (Signed)
Patient more alert this morning and requesting to take off cpap.  Placed on nasal cannula at 3L and patient has audible wheezing. RRT notified and breathing treatment given.  Pt placed back on cpap due to increased respirations and continued wheezing after treatment.  MD on call notified. Orders given for labs.

## 2021-09-05 NOTE — Progress Notes (Signed)
Patient with increased respirations and crackles on lung assessment.  MD notified lasix given earlier in day.  CXR and 1x dose lasix ordered.  Will administer as ordered and continue to monitor.

## 2021-09-05 NOTE — Progress Notes (Signed)
PROGRESS NOTE    Linda Oliver  XLK:[id number] DOB: 1932-03-14 DOA: 09/08/2021 PCP: Shon Baton, MD   Brief Narrative:  HPI per Dr. Neva Seat on 08/10/2021  HPI: Linda Oliver is a 88 y.o. female with medical history significant of diabetes, neuropathy, GERD, obesity, carotid artery disease, CAD status post CABG, CVA, CHF, hypertension, atrial fibrillation, OSA on CPAP, mild cognitive impairment, anemia presenting with altered mental status.   Patient presenting with encephalopathy so history obtained with assistance of daughter and chart review.  Patient currently lives with her daughter who has noticed a decline in the past 5 days.  Patient now altered and weak.  Daughter reported patient has been talking to people who are not there and has been mumbling a lot.  She also has been less communicative with her daughter compared to her baseline.  She is had decreased p.o. intake and decreased urination.  She has slid out of her bed a couple of times as she does not seem to have the strength to stand/walk.  Patient was taken to their PCP yesterday but they were unable to get blood work or urine there and patient was started on Keflex in case she was developing UTI.   Unable to obtain full review of systems due to patient's encephalopathy/dementia.   ED Course: Vital signs in the ED significant for fever to 100.5, heart rate in the 50s to 60s, blood pressure in the 366Y to 403K systolic, respiratory rate in the teens to 20s requiring 2 L to maintain saturations and desaturating to upper 80s on room air.  Lab work-up included CMP with BUN of 32, creatinine 2.02 from baseline of 1.4, glucose 149, albumin 3.3, AST 52.  CBC with leukocytosis to 12.3, hemoglobin 7.8 down from baseline of around 10 with low normal MCV.  PT and INR elevated at 35.1 and 3.5 respectively.  BNP elevated to 417.  FOBT negative.  Lactic acid normal, 2.3, 2.2 on repeat's.  Respiratory panel flu and COVID-negative.  Urinalysis with  glucose and protein only.  Blood cultures pending.  CT head showed no acute abnormality.  Chest x-ray showed findings consistent with increasing multifocal pneumonia.  Patient received ceftriaxone azithromycin in the ED as well as Tylenol and a liter of fluids.   **Interim History The work-up reveals that she has an E. coli bacteremia and will obtain a CT of the chest without contrast to further evaluate for her pneumonia and it shows a multifocal pneumonia.  She is improving but she was a little delirious today and was sundowning.  Given her worsening respiratory status she was given some IV Solu-Medrol and added some Brovana and budesonide.  We will repeat her blood cultures and continue to treat her for her multifocal pneumonia and given her volume overload she was received another dose of IV Lasix.  Assessment and Plan:  E. coli bacteremia Multifocal pneumonia Acute encephalopathy > Patient presenting with general decline x5 days per daughter with weakness and confusion. > Found to have evidence of multifocal pneumonia on chest x-ray with leukocytosis 12.3 and acute on chronic oxygen requirement; now WBC is trending up and went from 12.9 -> 13.3 -> 18.7 > Cultures x2 show positive for E. coli: Repeat blood cultures > Initial lactic acid normal followed by mildly elevated at 2.3 followed by improved to 2.2.  Received a liter of fluids in the ED. > Started on ceftriaxone and azithromycin in the ED. - Monitor on telemetry - Continue with ceftriaxone and azithromycin;  currently awaiting sensitivities for E. coli - Continue supplemental oxygen, wean as tolerated - Trend fever curve and WBC - Follow-up blood cultures and repeat them if necessary -Check CT of the chest without contrast given renal dysfunction; Showed "Multifocal pneumonia without complication. Reactive nodes in the mediastinum.  Possible 3.3 cm incidental thyroid nodule. Recommend nonemergent thyroid US if clinically warranted given  patient age. Calcified atherosclerotic change in the nonaneurysmal thoracic aorta. Coronary artery disease. Aortic Atherosclerosis "    Acute on Chronic respiratory failure with hypoxia Chronic systolic CHF > Patient presenting with Respiratory failure requiring 2 L to maintain saturations.  Likely multifactorial in the setting of pneumonia as above and degree of CHF exacerbation. > Details of pneumonia are above.  Noted to have evidence of possible mild CHF exacerbation with BNP elevated to 417 and exam with trace rales but no significant LE edema.  She started having worsening wheezing so she was started on Solu-Medrol and Brovana budesonide given another dose of IV Lasix -BNP elevated to 849.9 > Last echo was 2 months ago with EF 35-40%, indeterminate diastolic function, normal RV function. > No Lasix given initially and in fact fluids were given due to elevated lactic acid.  Received IV Lasix again this a.m. -Start Xopenex and Atrovent and will add Brovana and budesonide - Monitor on telemetry - Supplemental oxygen as needed, wean as tolerated - Strict I's and O's, daily weights - Hold off on repeat echocardiogram as one was done 2 months ago - Strict I's and O's; she is -2.927 L - Check magnesium - Trend renal function and electrolytes - Continue home isosorbide and metoprolol   Hypokalemia -Patient is potassium is 3.0 -Replete with p.o. KCl 40 mill colons twice daily x2 -Continue monitor and replete as necessary -Continue monitor and trend and repeat CMP in a.m.   Acute on chronic anemia > Noted to have hemoglobin of 7.8 down from baseline of 10 in the ED. > FOBT was negative.  No overt signs of bleeding. - We will hold blood thinner overnight in case there is any none overt bleeding occurring. - Trend hemoglobin overnight -Iron level was 31, U IBC 02/10/1990, TIBC 223, saturation ratios of 14%, ferritin level of 69 - Type and screen -See below   AKI on CKD Stage 3b >  Creatinine elevated to 2.02 from baseline 1.4.  Unclear if this represents dehydration versus volume overload given mild lactic acid elevation as above but also having elevated BNP. > Patient's BUNs/creatinine went from 32/2.02 is now 34/2.05 yesterday and today it is 30/1.83 -Received IV Lasix yesterday at 20 mg and again this morning at 40 mg - Trend renal function and electrolytes -Avoid further nephrotoxic medications, contrast dyes, hypotension and dehydration to ensure adequate renal perfusion and renally dose medications -Repeat CMP in a.m.   Carotid artery disease CAD status post CABG History of stroke - Continue home Plavix, Zetia, rosuvastatin - Continue home isosorbide, and metoprolol holding home metoprolol -Continue to monitor and trend   Hypertension  -Received 40 mg of IV Lasix again -Resume home metoprolol succinate 12.5 TWICE DAILY -Continue monitor blood pressures per protocol   Abnormal LFTs -AST went from 52 and is now 124 but is now improved to 93 and ALT went from 74 is now 65 yesterday and today is 68 -In the setting of above -Continue monitor and trend and repeat CMP in a.m. and if necessary obtain a right upper quadrant ultrasound as well as acute hepatitis panel  Atrial fibrillation - Continue home amiodarone and metoprolol - Holding home Eliquis in the setting of anemia work-up ongoing    Normocytic Anemia -Patient's hemoglobin/hematocrit went from 7.8/25.6 is now 8.2/27.7 yesterday and today it is 8.6/29.0 -Check anemia panel and showed an iron level of 31, U IBC of 192, TIBC of 223, saturation ratios of 14%, ferritin level 69 -Continue to monitor for signs and symptoms bleeding; no overt bleeding noted For repeat CBC in a.m.    Diabetes mellitus type II > Chart lists 24 units daily. > 12U long acting Daily - SSI -CBGs ranging from 98-2 32   OSA - Continue home CPAP   Mild cognitive impairment/dementia - Continue home donepezil/memantine  Combination pill   GERD - Continue home PPI   Obesity -Complicates overall prognosis and care -Estimated body mass index is 29.32 kg/m as calculated from the following:   Height as of this encounter: 5' (1.524 m).   Weight as of this encounter: 68.1 kg.  -Weight Loss and Dietary Counseling given  DVT prophylaxis: SCDs Start: 08/27/2021 2332    Code Status: Full Code Family Communication: Discussed with son-in-law and daughter at bedside  Disposition Plan:  Level of care: Telemetry Status is: Inpatient Remains inpatient appropriate because: Needs further clinical improvement prior to safe discharge disposition and PT/OT is recommending home health   Consultants:  None  Procedures:  As above  Antimicrobials:  Anti-infectives (From admission, onward)    Start     Dose/Rate Route Frequency Ordered Stop   09/04/21 1400  azithromycin (ZITHROMAX) 500 mg in sodium chloride 0.9 % 250 mL IVPB  Status:  Discontinued        500 mg 250 mL/hr over 60 Minutes Intravenous Every 24 hours 09/04/21 1126 09/04/21 1129   09/04/21 1400  cefTRIAXone (ROCEPHIN) 2 g in sodium chloride 0.9 % 100 mL IVPB        2 g 200 mL/hr over 30 Minutes Intravenous Every 24 hours 09/04/21 1128     09/04/21 1215  azithromycin (ZITHROMAX) tablet 500 mg        500 mg Oral Daily 09/04/21 1129 09/08/21 0959   08/21/2021 1445  cefTRIAXone (ROCEPHIN) 2 g in sodium chloride 0.9 % 100 mL IVPB  Status:  Discontinued        2 g 200 mL/hr over 30 Minutes Intravenous Every 24 hours 09/08/2021 1440 09/04/21 1128   08/30/2021 1445  azithromycin (ZITHROMAX) 500 mg in sodium chloride 0.9 % 250 mL IVPB  Status:  Discontinued        500 mg 250 mL/hr over 60 Minutes Intravenous Every 24 hours 08/27/2021 1440 09/04/21 1126       Subjective: Seen and examined at bedside and she is doing little bit better but was sundowning today and little bit more confused.  Family thinks that she is much more interactive though.  She denies any  current pain or shortness of breath.  No other lightheadedness or dizziness.  No other concerns or complaints at this time.  Objective: Vitals:   09/05/21 0537 09/05/21 0820 09/05/21 0935 09/05/21 1211  BP: (!) 128/98   (!) 158/48  Pulse: 85   85  Resp: 20   (!) 22  Temp: 98.2 F (36.8 C)   97.6 F (36.4 C)  TempSrc: Oral   Axillary  SpO2: 94% 95% 95% 92%  Weight:      Height:        Intake/Output Summary (Last 24 hours) at 09/05/2021 1718 Last data filed  at 09/05/2021 1247 Gross per 24 hour  Intake 420 ml  Output 3300 ml  Net -2880 ml   Filed Weights   08/24/2021 1241 09/04/21 0500 09/05/21 0500  Weight: 71.6 kg 71.7 kg 68.1 kg   Examination: Physical Exam:  Constitutional: WN/WD chronically ill-appearing Caucasian female currently no acute distress but she is little bit more confused today Respiratory: Diminished to auscultation bilaterally with coarse breath sounds and has some rhonchi and slight crackles.  Unlabored breathing but oxygen requirement has gone from 2 to 3 L Cardiovascular: Irregularly irregular, no murmurs / rubs / gallops. S1 and S2 auscultated.  No appreciable extremity edema Abdomen: Soft, non-tender, non-distended. No masses palpated.  Bowel sounds positive.  GU: Deferred. Musculoskeletal: No clubbing / cyanosis of digits/nails. No joint deformity upper and lower extremities.  Skin: No rashes, lesions, ulcers on limited skin evaluation. No induration; Warm and dry.  Neurologic: CN 2-12 grossly intact with no focal deficits. Romberg sign and cerebellar reflexes not assessed.  Psychiatric: Normal judgment and insight. Alert and oriented x 3. Normal mood and appropriate affect.   Data Reviewed: I have personally reviewed following labs and imaging studies  CBC: Recent Labs  Lab 08/30/2021 1250 09/04/21 0000 09/04/21 0515 09/05/21 0728  WBC 12.3* 12.9* 13.3* 18.7*  HGB 7.8* 7.3* 8.2* 8.6*  HCT 25.6* 24.9* 27.7* 29.0*  MCV 85.6 88.0 87.9 87.1  PLT 312  278 244 993   Basic Metabolic Panel: Recent Labs  Lab 09/07/2021 1250 09/04/21 0000 09/04/21 0515 09/05/21 0728  NA 140  --  142 144  K 4.2  --  4.0 3.0*  CL 103  --  108 104  CO2 27  --  25 30  GLUCOSE 149*  --  58* 102*  BUN 32*  --  34* 34*  CREATININE 2.02*  --  2.05* 1.83*  CALCIUM 9.0  --  8.5* 8.9  MG  --  2.1  --  2.0  PHOS  --   --   --  3.6   GFR: Estimated Creatinine Clearance: 19 mL/min (A) (by C-G formula based on SCr of 1.83 mg/dL (H)). Liver Function Tests: Recent Labs  Lab 08/31/2021 1250 09/04/21 0515 09/05/21 0728  AST 52* 124* 93*  ALT 32 60* 68*  ALKPHOS 102 108 127*  BILITOT 0.9 0.9 1.1  PROT 6.6 6.2* 6.3*  ALBUMIN 3.3* 2.6* 2.5*   No results for input(s): "LIPASE", "AMYLASE" in the last 168 hours. No results for input(s): "AMMONIA" in the last 168 hours. Coagulation Profile: Recent Labs  Lab 08/29/2021 1250  INR 3.5*   Cardiac Enzymes: No results for input(s): "CKTOTAL", "CKMB", "CKMBINDEX", "TROPONINI" in the last 168 hours. BNP (last 3 results) No results for input(s): "PROBNP" in the last 8760 hours. HbA1C: No results for input(s): "HGBA1C" in the last 72 hours. CBG: Recent Labs  Lab 09/04/21 2145 09/05/21 0721 09/05/21 1206 09/05/21 1654 09/05/21 1710  GLUCAP 110* 98 179* 224* 232*   Lipid Profile: No results for input(s): "CHOL", "HDL", "LDLCALC", "TRIG", "CHOLHDL", "LDLDIRECT" in the last 72 hours. Thyroid Function Tests: No results for input(s): "TSH", "T4TOTAL", "FREET4", "T3FREE", "THYROIDAB" in the last 72 hours. Anemia Panel: Recent Labs    09/04/21 0000  FERRITIN 69  TIBC 223*  IRON 31  RETICCTPCT 3.4*   Sepsis Labs: Recent Labs  Lab 08/17/2021 1435 08/14/2021 1636 08/14/2021 1930  LATICACIDVEN 1.4 2.3* 2.2*    Recent Results (from the past 240 hour(s))  Culture, blood (routine x 2)  Status: Abnormal (Preliminary result)   Collection Time: 08/29/2021  3:00 PM   Specimen: BLOOD LEFT ARM  Result Value Ref Range  Status   Specimen Description   Final    BLOOD LEFT ARM Performed at [facility], [address] [place], [place] [address]    Special Requests   Final    BOTTLES DRAWN AEROBIC AND ANAEROBIC Blood Culture adequate volume Performed at [facility], [address], [place], [place] [address]    Culture  Setup Time   Final    GRAM NEGATIVE RODS ANAEROBIC BOTTLE ONLY CRITICAL RESULT CALLED TO, READ BACK BY AND VERIFIED WITH: C/ PHARMD MJeneen Rinks 09/04/21 0628 A. LAFRANCE    Culture (A)  Final    ESCHERICHIA COLI SUSCEPTIBILITIES TO FOLLOW Performed at [facility], [place] [address] [place], [place] [address]    Report Status PENDING  Incomplete  Blood Culture ID Panel (Reflexed)     Status: Abnormal   Collection Time: 08/10/2021  3:00 PM  Result Value Ref Range Status   Enterococcus faecalis NOT DETECTED NOT DETECTED Final   Enterococcus Faecium NOT DETECTED NOT DETECTED Final   Listeria monocytogenes NOT DETECTED NOT DETECTED Final   Staphylococcus species NOT DETECTED NOT DETECTED Final   Staphylococcus aureus (BCID) NOT DETECTED NOT DETECTED Final   Staphylococcus epidermidis NOT DETECTED NOT DETECTED Final   Staphylococcus lugdunensis NOT DETECTED NOT DETECTED Final   Streptococcus species NOT DETECTED NOT DETECTED Final   Streptococcus agalactiae NOT DETECTED NOT DETECTED Final   Streptococcus pneumoniae NOT DETECTED NOT DETECTED Final   Streptococcus pyogenes NOT DETECTED NOT DETECTED Final   A.calcoaceticus-baumannii NOT DETECTED NOT DETECTED Final   Bacteroides fragilis NOT DETECTED NOT DETECTED Final   Enterobacterales DETECTED (A) NOT DETECTED Final    Comment: Enterobacterales represent a large order of gram negative bacteria, not a single organism. CRITICAL RESULT CALLED TO, READ BACK BY AND VERIFIED WITH: C/ PHARMD M. JAMES 09/04/21 0628 A. LAFRANCE    Enterobacter cloacae complex NOT DETECTED NOT DETECTED Final    Escherichia coli DETECTED (A) NOT DETECTED Final    Comment: CRITICAL RESULT CALLED TO, READ BACK BY AND VERIFIED WITH: C/ PHARMD M. JAMES 09/04/21 0628 A. LAFRANCE    Klebsiella aerogenes NOT DETECTED NOT DETECTED Final   Klebsiella oxytoca NOT DETECTED NOT DETECTED Final   Klebsiella pneumoniae NOT DETECTED NOT DETECTED Final   Proteus species NOT DETECTED NOT DETECTED Final   Salmonella species NOT DETECTED NOT DETECTED Final   Serratia marcescens NOT DETECTED NOT DETECTED Final   Haemophilus influenzae NOT DETECTED NOT DETECTED Final   Neisseria meningitidis NOT DETECTED NOT DETECTED Final   Pseudomonas aeruginosa NOT DETECTED NOT DETECTED Final   Stenotrophomonas maltophilia NOT DETECTED NOT DETECTED Final   Candida albicans NOT DETECTED NOT DETECTED Final   Candida auris NOT DETECTED NOT DETECTED Final   Candida glabrata NOT DETECTED NOT DETECTED Final   Candida krusei NOT DETECTED NOT DETECTED Final   Candida parapsilosis NOT DETECTED NOT DETECTED Final   Candida tropicalis NOT DETECTED NOT DETECTED Final   Cryptococcus neoformans/gattii NOT DETECTED NOT DETECTED Final   CTX-M ESBL NOT DETECTED NOT DETECTED Final   Carbapenem resistance IMP NOT DETECTED NOT DETECTED Final   Carbapenem resistance KPC NOT DETECTED NOT DETECTED Final   Carbapenem resistance NDM NOT DETECTED NOT DETECTED Final   Carbapenem resist OXA 48 LIKE NOT DETECTED NOT DETECTED Final   Carbapenem resistance VIM NOT DETECTED NOT DETECTED Final  Comment: Performed at [facility], [place] [address], [place], [place] [address]  Culture, blood (routine x 2)     Status: None (Preliminary result)   Collection Time: 08/24/2021  3:24 PM   Specimen: BLOOD RIGHT WRIST  Result Value Ref Range Status   Specimen Description   Final    BLOOD RIGHT WRIST Performed at [facility], [address] [place], [place] [address]    Special Requests   Final    BOTTLES DRAWN AEROBIC AND ANAEROBIC Blood  Culture adequate volume Performed at [facility], [address] [place], [place] [address]    Culture   Final    NO GROWTH 2 DAYS Performed at [facility], [place] [address], [place], [place] [address]    Report Status PENDING  Incomplete  Resp Panel by RT-PCR (Flu A&B, Covid) Urine, Clean Catch     Status: None   Collection Time: 08/22/2021  4:36 PM   Specimen: Urine, Clean Catch; Nasal Swab  Result Value Ref Range Status   SARS Coronavirus 2 by RT PCR NEGATIVE NEGATIVE Final    Comment: (NOTE) SARS-CoV-2 target nucleic acids are NOT DETECTED.  The SARS-CoV-2 RNA is generally detectable in upper respiratory specimens during the acute phase of infection. The lowest concentration of SARS-CoV-2 viral copies this assay can detect is 138 copies/mL. A negative result does not preclude SARS-Cov-2 infection and should not be used as the sole basis for treatment or other patient management decisions. A negative result may occur with  improper specimen collection/handling, submission of specimen other than nasopharyngeal swab, presence of viral mutation(s) within the areas targeted by this assay, and inadequate number of viral copies(<138 copies/mL). A negative result must be combined with clinical observations, patient history, and epidemiological information. The expected result is Negative.  Fact Sheet for Patients:  EntrepreneurPulse.com.au  Fact Sheet for Healthcare Providers:  IncredibleEmployment.be  This test is no t yet approved or cleared by the [place] FDA and  has been authorized for detection and/or diagnosis of SARS-CoV-2 by FDA under an Emergency Use Authorization (EUA). This EUA will remain  in effect (meaning this test can be used) for the duration of the COVID-19 declaration under Section 564(b)(1) of the Act, 21 U.S.C.section 360bbb-3(b)(1), unless the authorization is terminated  or revoked sooner.        Influenza A by PCR NEGATIVE NEGATIVE Final   Influenza B by PCR NEGATIVE NEGATIVE Final    Comment: (NOTE) The Xpert Xpress SARS-CoV-2/FLU/RSV plus assay is intended as an aid in the diagnosis of influenza from Nasopharyngeal swab specimens and should not be used as a sole basis for treatment. Nasal washings and aspirates are unacceptable for Xpert Xpress SARS-CoV-2/FLU/RSV testing.  Fact Sheet for Patients: EntrepreneurPulse.com.au  Fact Sheet for Healthcare Providers: IncredibleEmployment.be  This test is not yet approved or cleared by the [place] FDA and has been authorized for detection and/or diagnosis of SARS-CoV-2 by FDA under an Emergency Use Authorization (EUA). This EUA will remain in effect (meaning this test can be used) for the duration of the COVID-19 declaration under Section 564(b)(1) of the Act, 21 U.S.C. section 360bbb-3(b)(1), unless the authorization is terminated or revoked.  Performed at [facility], [address] [place], [place] [address]     Radiology Studies: DG CHEST PORT 1 VIEW  Result Date: 09/05/2021 CLINICAL DATA:  Shortness of breath EXAM: PORTABLE CHEST 1 VIEW COMPARISON:  September 04, 2021 FINDINGS: Multifocal pneumonia remains, similar on  the left and mildly worsened on the right. Infiltrate is now projected over the right base. The cardiomediastinal silhouette is stable. No pneumothorax. No other abnormalities. IMPRESSION: Mild worsening of multifocal pneumonia. Electronically Signed   By: Dorise Bullion III M.D.   On: 09/05/2021 09:35   DG CHEST PORT 1 VIEW  Result Date: 09/04/2021 CLINICAL DATA:  Shortness of breath EXAM: PORTABLE CHEST 1 VIEW COMPARISON:  CT chest dated 09/04/2021 FINDINGS: Multifocal patchy opacities, right upper lobe predominant, compatible with pneumonia. This is better evaluated on recent CT. Mild cardiomegaly. Median sternotomy. IMPRESSION: Multifocal  pneumonia, right upper lobe predominant, better evaluated on recent CT. Electronically Signed   By: Julian Hy M.D.   On: 09/04/2021 21:48   CT CHEST WO CONTRAST  Result Date: 09/04/2021 CLINICAL DATA:  Pneumonia.  Suspected complication. EXAM: CT CHEST WITHOUT CONTRAST TECHNIQUE: Multidetector CT imaging of the chest was performed following the standard protocol without IV contrast. RADIATION DOSE REDUCTION: This exam was performed according to the departmental dose-optimization program which includes automated exposure control, adjustment of the mA and/or kV according to patient size and/or use of iterative reconstruction technique. COMPARISON:  Chest x-ray September 03, 2021 FINDINGS: Cardiovascular: Calcified atherosclerotic changes identified in the nonaneurysmal aorta. Three-vessel coronary artery disease identified. The heart size is borderline. Central pulmonary arteries are unremarkable. Mediastinum/Nodes: No pleural or pericardial effusions. Shotty nodes in the mediastinum are likely reactive without gross adenopathy. Possible enlargement of the right thyroid lobe containing a calcification, asymmetric to the left. No underlying nodule measuring up to 3.3 cm is not excluded. The esophagus is normal. Lungs/Pleura: Central airways are normal. No pneumothorax. Patchy bilateral pulmonary infiltrates involve all lobes, likely multifocal pneumonia. No abscess or complication identified. Upper Abdomen: No acute abnormality. Musculoskeletal: No chest wall mass or suspicious bone lesions identified. IMPRESSION: 1. Multifocal pneumonia without complication. Reactive nodes in the mediastinum. 2. Possible 3.3 cm incidental thyroid nodule. Recommend nonemergent thyroid US if clinically warranted given patient age. Reference: J Am Coll Radiol. 2015 Feb;12(2): 143-50 3. Calcified atherosclerotic change in the nonaneurysmal thoracic aorta. Coronary artery disease. Aortic Atherosclerosis (ICD10-I70.0).  Electronically Signed   By: Dorise Bullion III M.D.   On: 09/04/2021 16:21     Scheduled Meds:  amiodarone  200 mg Oral Daily   arformoterol  15 mcg Nebulization BID   azithromycin  500 mg Oral Daily   budesonide (PULMICORT) nebulizer solution  0.25 mg Nebulization BID   clopidogrel  75 mg Oral Daily   memantine  28 mg Oral QHS   And   donepezil  10 mg Oral QHS   DULoxetine  60 mg Oral Daily   ezetimibe  10 mg Oral Daily   gabapentin  300 mg Oral BID   insulin aspart  0-15 Units Subcutaneous TID WC   insulin glargine-yfgn  12 Units Subcutaneous Daily   ipratropium  0.5 mg Nebulization BID   isosorbide dinitrate  30 mg Oral BID   levalbuterol  0.63 mg Nebulization BID   methylPREDNISolone (SOLU-MEDROL) injection  40 mg Intravenous BID   metoprolol succinate  12.5 mg Oral Daily   pantoprazole  40 mg Oral Daily   potassium chloride  40 mEq Oral BID   rosuvastatin  10 mg Oral QAC supper   sodium chloride flush  3 mL Intravenous Q12H   Continuous Infusions:  cefTRIAXone (ROCEPHIN)  IV 2 g (09/05/21 1352)    LOS: 2 days   Raiford Noble, DO [facility] Available via Epic secure chat 7am-7pm  After these hours, please refer to coverage provider listed on [email or web address] 09/05/2021, 5:18 PM

## 2021-09-05 NOTE — Progress Notes (Signed)
Pharmacist Heart Failure Core Measure Documentation  Assessment: Linda Oliver has an EF documented as 35-40%  on 07/07/2021 by echo.  Rationale: Heart failure patients with left ventricular systolic dysfunction (LVSD) and an EF < 40% should be prescribed an angiotensin converting enzyme inhibitor (ACEI) or angiotensin receptor blocker (ARB) at discharge unless a contraindication is documented in the medical record.  This patient is not currently on an ACEI or ARB for HF.  This note is being placed in the record in order to provide documentation that a contraindication to the use of these agents is present for this encounter.  ACE Inhibitor or Angiotensin Receptor Blocker is contraindicated (specify all that apply)  '[]'$   ACEI allergy AND ARB allergy '[]'$   Angioedema '[]'$   Moderate or severe aortic stenosis '[]'$   Hyperkalemia '[]'$   Hypotension '[]'$   Renal artery stenosis '[x]'$   Worsening renal function, preexisting renal disease or dysfunction   Eudelia Bunch, Pharm.D 09/05/2021 2:10 PM

## 2021-09-05 NOTE — Evaluation (Signed)
Occupational Therapy Evaluation Patient Details Name: Linda Oliver MRN: [id number] DOB: 24-Sep-1932 Today's Date: 09/05/2021   History of Present Illness Linda Oliver is a 88 y.o. female with medical history significant of diabetes, neuropathy, GERD, obesity, carotid artery disease, CAD status post CABG, CVA, CHF, hypertension, atrial fibrillation, OSA on CPAP, mild cognitive impairment, anemia presenting with altered mental status. The work-up reveals that she has an E. coli bacteremia and will obtain a CT of the chest without contrast to further evaluate for her pneumonia.   Clinical Impression   Linda Oliver is an 88 year old woman admitted to hospital with above medical history and presents with generalized weakness, decreased activity tolerance, impaired balance and cardiopulmonary endurance. She has mild cognitive deficits at baseline. Patient requiring 3 L [place] at this time to maintain sats. She has increased WOB even at rest today. Patient needing min assist to ambulate with walker and increased assistance with ADLs including max-total assist for LB ADLs and toileting. Patient will benefit from skilled OT services while in hospital to improve deficits and learn compensatory strategies as needed in order to return to PLOF.  Family agreeable to [facility] at discharge.        Recommendations for follow up therapy are one component of a multi-disciplinary discharge planning process, led by the attending physician.  Recommendations may be updated based on patient status, additional functional criteria and insurance authorization.   Follow Up Recommendations  Home health OT    Assistance Recommended at Discharge Frequent or constant Supervision/Assistance  Patient can return home with the following A little help with walking and/or transfers;A lot of help with bathing/dressing/bathroom;Assistance with cooking/housework;Direct supervision/assist for financial management;Direct supervision/assist for  medications management;Help with stairs or ramp for entrance    Functional Status Assessment  Patient has had a recent decline in their functional status and demonstrates the ability to make significant improvements in function in a reasonable and predictable amount of time.  Equipment Recommendations  None recommended by OT    Recommendations for Other Services       Precautions / Restrictions Precautions Precautions: Fall Precaution Comments: fell at home Restrictions Weight Bearing Restrictions: No      Mobility Bed Mobility Overal bed mobility: Needs Assistance Bed Mobility: Supine to Sit     Supine to sit: Supervision, HOB elevated     General bed mobility comments: increased time    Transfers Overall transfer level: Needs assistance Equipment used: Rolling walker (2 wheels) Transfers: Sit to/from Stand, Bed to chair/wheelchair/BSC Sit to Stand: Min guard     Step pivot transfers: Min assist     General transfer comment: Intermittent steadying assist due to posterior LOBs with walker.      Balance Overall balance assessment: Needs assistance Sitting-balance support: No upper extremity supported, Feet supported Sitting balance-Leahy Scale: Good     Standing balance support: Reliant on assistive device for balance Standing balance-Leahy Scale: Poor                             ADL either performed or assessed with clinical judgement   ADL Overall ADL's : Needs assistance/impaired Eating/Feeding: Set up;Sitting   Grooming: Set up;Sitting   Upper Body Bathing: Set up;Sitting   Lower Body Bathing: Maximal assistance;Sitting/lateral leans   Upper Body Dressing : Set up;Sitting   Lower Body Dressing: Total assistance;Sit to/from stand   Toilet Transfer: Minimal assistance;BSC/3in1;Rolling walker (2 wheels)   Toileting- Clothing  Manipulation and Hygiene: Total assistance;Sit to/from stand       Functional mobility during ADLs: Minimal  assistance;Rolling walker (2 wheels)       Vision   Vision Assessment?: No apparent visual deficits     Perception     Praxis      Pertinent Vitals/Pain Pain Assessment Pain Assessment: No/denies pain     Hand Dominance Right   Extremity/Trunk Assessment Upper Extremity Assessment Upper Extremity Assessment: Generalized weakness   Lower Extremity Assessment Lower Extremity Assessment: Defer to PT evaluation   Cervical / Trunk Assessment Cervical / Trunk Assessment: Kyphotic   Communication Communication Communication: HOH   Cognition Arousal/Alertness: Awake/alert Behavior During Therapy: WFL for tasks assessed/performed Overall Cognitive Status: History of cognitive impairments - at baseline                                       General Comments  Patient found on 3 L [place]. With o2 sat at 90s. Some difficulty with signal quaility. Potentially dropped to 86% but recovered back into 90s with activity. Some increased WOB at rest.    Exercises     Shoulder Instructions      Home Living Family/patient expects to be discharged to:: Private residence Living Arrangements: Children Available Help at Discharge: Family;Available 24 hours/day Type of Home: House Home Access: Stairs to enter [employer] of Steps: 3 Entrance Stairs-Rails: Can reach both Home Layout: Two level;Able to live on main level with bedroom/bathroom     Bathroom Shower/Tub: Occupational psychologist: Handicapped height Bathroom Accessibility: No   Home Equipment: Rollator (4 wheels);Cane - quad;Shower seat;Other (comment);BSC/3in1   Additional Comments: Has an apartment built onto her daughters home      Prior Functioning/Environment Prior Level of Function : Needs assist             Mobility Comments: uses a rollator ADLs Comments: Performs ADLs. Daughter assists with IADLs - has required increased assistance this last week        OT Problem  List: Decreased strength;Decreased activity tolerance;Impaired balance (sitting and/or standing);Decreased cognition;Decreased safety awareness;Decreased knowledge of use of DME or AE;Obesity;Cardiopulmonary status limiting activity      OT Treatment/Interventions: Self-care/ADL training;Therapeutic exercise;Energy conservation;DME and/or AE instruction;Therapeutic activities;Balance training;Patient/family education;Cognitive remediation/compensation    OT Goals(Current goals can be found in the care plan section) Acute Rehab OT Goals Patient Stated Goal: walk to bathroom OT Goal Formulation: With family Time For Goal Achievement: 09/19/21 Potential to Achieve Goals: Good  OT Frequency: Min 2X/week    Co-evaluation              AM-PAC OT "6 Clicks" Daily Activity     Outcome Measure Help from another person eating meals?: A Little Help from another person taking care of personal grooming?: A Little Help from another person toileting, which includes using toliet, bedpan, or urinal?: Total Help from another person bathing (including washing, rinsing, drying)?: A Lot Help from another person to put on and taking off regular upper body clothing?: A Little Help from another person to put on and taking off regular lower body clothing?: Total 6 Click Score: 13   End of Session Equipment Utilized During Treatment: Rolling walker (2 wheels);Oxygen Nurse Communication: Mobility status  Activity Tolerance: Patient tolerated treatment well Patient left: in chair;with call bell/phone within reach;with family/visitor present;with nursing/sitter in room  OT Visit Diagnosis: Muscle weakness (  generalized) (M62.81);Other symptoms and signs involving cognitive function                Time: 7793-9688 OT Time Calculation (min): 34 min Charges:  OT General Charges $OT Visit: 1 Visit OT Evaluation $OT Eval Low Complexity: 1 Low OT Treatments $Self Care/Home Management : 8-22 mins  Quinlee Sciarra,  OTR/L [place]  Office [phone] Pager: [place] 09/05/2021, 11:08 AM

## 2021-09-06 ENCOUNTER — Inpatient Hospital Stay (HOSPITAL_COMMUNITY): Payer: Medicare Other

## 2021-09-06 DIAGNOSIS — N179 Acute kidney failure, unspecified: Secondary | ICD-10-CM | POA: Diagnosis not present

## 2021-09-06 DIAGNOSIS — J189 Pneumonia, unspecified organism: Secondary | ICD-10-CM | POA: Diagnosis not present

## 2021-09-06 DIAGNOSIS — J9601 Acute respiratory failure with hypoxia: Secondary | ICD-10-CM | POA: Diagnosis not present

## 2021-09-06 DIAGNOSIS — I5023 Acute on chronic systolic (congestive) heart failure: Secondary | ICD-10-CM | POA: Diagnosis not present

## 2021-09-06 LAB — COMPREHENSIVE METABOLIC PANEL
ALT: 53 U/L — ABNORMAL HIGH (ref 0–44)
AST: 53 U/L — ABNORMAL HIGH (ref 15–41)
Albumin: 2.3 g/dL — ABNORMAL LOW (ref 3.5–5.0)
Alkaline Phosphatase: 125 U/L (ref 38–126)
Anion gap: 9 (ref 5–15)
BUN: 39 mg/dL — ABNORMAL HIGH (ref 8–23)
CO2: 29 mmol/L (ref 22–32)
Calcium: 8.5 mg/dL — ABNORMAL LOW (ref 8.9–10.3)
Chloride: 103 mmol/L (ref 98–111)
Creatinine, Ser: 1.82 mg/dL — ABNORMAL HIGH (ref 0.44–1.00)
GFR, Estimated: 27 mL/min — ABNORMAL LOW (ref >60)
Glucose, Bld: 234 mg/dL — ABNORMAL HIGH (ref 70–99)
Potassium: 3.5 mmol/L (ref 3.5–5.1)
Sodium: 141 mmol/L (ref 135–145)
Total Bilirubin: 1 mg/dL (ref 0.3–1.2)
Total Protein: 5.9 g/dL — ABNORMAL LOW (ref 6.5–8.1)

## 2021-09-06 LAB — GLUCOSE, CAPILLARY
Glucose-Capillary: 235 mg/dL — ABNORMAL HIGH (ref 70–99)
Glucose-Capillary: 286 mg/dL — ABNORMAL HIGH (ref 70–99)
Glucose-Capillary: 271 mg/dL — ABNORMAL HIGH (ref 70–99)
Glucose-Capillary: 239 mg/dL — ABNORMAL HIGH (ref 70–99)

## 2021-09-06 LAB — CBC WITH DIFFERENTIAL/PLATELET
Abs Immature Granulocytes: 0.1 10*3/uL — ABNORMAL HIGH (ref 0.00–0.07)
Basophils Absolute: 0 10*3/uL (ref 0.0–0.1)
Basophils Relative: 0 %
Eosinophils Absolute: 0 10*3/uL (ref 0.0–0.5)
Eosinophils Relative: 0 %
HCT: 26.9 % — ABNORMAL LOW (ref 36.0–46.0)
Hemoglobin: 7.8 g/dL — ABNORMAL LOW (ref 12.0–15.0)
Immature Granulocytes: 1 %
Lymphocytes Relative: 3 %
Lymphs Abs: 0.5 10*3/uL — ABNORMAL LOW (ref 0.7–4.0)
MCH: 25.7 pg — ABNORMAL LOW (ref 26.0–34.0)
MCHC: 29 g/dL — ABNORMAL LOW (ref 30.0–36.0)
MCV: 88.5 fL (ref 80.0–100.0)
Monocytes Absolute: 0.7 10*3/uL (ref 0.1–1.0)
Monocytes Relative: 5 %
Neutro Abs: 12.3 10*3/uL — ABNORMAL HIGH (ref 1.7–7.7)
Neutrophils Relative %: 91 %
Platelets: 305 10*3/uL (ref 150–400)
RBC: 3.04 MIL/uL — ABNORMAL LOW (ref 3.87–5.11)
RDW: 17.2 % — ABNORMAL HIGH (ref 11.5–15.5)
WBC: 13.6 10*3/uL — ABNORMAL HIGH (ref 4.0–10.5)
nRBC: 0.2 % (ref 0.0–0.2)

## 2021-09-06 LAB — CULTURE, BLOOD (ROUTINE X 2): Special Requests: ADEQUATE

## 2021-09-06 LAB — MAGNESIUM: Magnesium: 2 mg/dL (ref 1.7–2.4)

## 2021-09-06 LAB — PHOSPHORUS: Phosphorus: 3 mg/dL (ref 2.5–4.6)

## 2021-09-06 NOTE — Progress Notes (Signed)
PROGRESS NOTE    Linda Oliver  TGG:[id number] DOB: 1932/09/25 DOA: 08/29/2021 PCP: Shon Baton, MD   Brief Narrative:  HPI per Dr. Neva Seat on 08/10/2021  HPI: Linda Oliver is a 88 y.o. female with medical history significant of diabetes, neuropathy, GERD, obesity, carotid artery disease, CAD status post CABG, CVA, CHF, hypertension, atrial fibrillation, OSA on CPAP, mild cognitive impairment, anemia presenting with altered mental status.   Patient presenting with encephalopathy so history obtained with assistance of daughter and chart review.  Patient currently lives with her daughter who has noticed a decline in the past 5 days.  Patient now altered and weak.  Daughter reported patient has been talking to people who are not there and has been mumbling a lot.  She also has been less communicative with her daughter compared to her baseline.  She is had decreased p.o. intake and decreased urination.  She has slid out of her bed a couple of times as she does not seem to have the strength to stand/walk.  Patient was taken to their PCP yesterday but they were unable to get blood work or urine there and patient was started on Keflex in case she was developing UTI.   Unable to obtain full review of systems due to patient's encephalopathy/dementia.   ED Course: Vital signs in the ED significant for fever to 100.5, heart rate in the 50s to 60s, blood pressure in the 703J to 009F systolic, respiratory rate in the teens to 20s requiring 2 L to maintain saturations and desaturating to upper 80s on room air.  Lab work-up included CMP with BUN of 32, creatinine 2.02 from baseline of 1.4, glucose 149, albumin 3.3, AST 52.  CBC with leukocytosis to 12.3, hemoglobin 7.8 down from baseline of around 10 with low normal MCV.  PT and INR elevated at 35.1 and 3.5 respectively.  BNP elevated to 417.  FOBT negative.  Lactic acid normal, 2.3, 2.2 on repeat's.  Respiratory panel flu and COVID-negative.  Urinalysis with  glucose and protein only.  Blood cultures pending.  CT head showed no acute abnormality.  Chest x-ray showed findings consistent with increasing multifocal pneumonia.  Patient received ceftriaxone azithromycin in the ED as well as Tylenol and a liter of fluids.   **Interim History The work-up reveals that she has an E. coli bacteremia and will obtain a CT of the chest without contrast to further evaluate for her pneumonia and it shows a multifocal pneumonia.  She is improving but she was a little delirious today and was sundowning.  Given her worsening respiratory status she was given some IV Solu-Medrol and added some Brovana and budesonide.  We will repeat her blood cultures and continue to treat her for her multifocal pneumonia and given her volume overload she was received another dose of IV Lasix yesterday but will hold off further doses today given that she appears euvolemic.  She appears to be improving significantly and E. coli sensitivities came back and they are sensitive to everything except ampicillin, ampicillin/sulbactam and cefazolin.  We will continue the ceftriaxone and azithromycin for her pneumonia as she is improving.  She will need an ambulatory home O2 screen and continued monitoring of her respiratory status carefully.   Assessment and Plan:  E. coli bacteremia Multifocal pneumonia Acute encephalopathy > Patient presenting with general decline x5 days per daughter with weakness and confusion. > Found to have evidence of multifocal pneumonia on chest x-ray with leukocytosis 12.3 and acute on chronic  oxygen requirement; now WBC is trending up and went from 12.9 -> 13.3 -> 18.7 -> 13.6 > Cultures x2 show positive for E. coli: Repeat blood cultures > Initial lactic acid normal followed by mildly elevated at 2.3 followed by improved to 2.2.  Received a liter of fluids in the ED. > Started on ceftriaxone and azithromycin in the ED. - Monitor on telemetry - Continue with  ceftriaxone and azithromycin; sensitivities of E. coli are back now and she is pansensitive except resistant to ampicillin, ampicillin sulbactam, and cefazolin - Continue supplemental oxygen, wean as tolerated - Trend fever curve and WBC - Follow-up blood cultures and repeat them if necessary -Check CT of the chest without contrast given renal dysfunction; Showed "Multifocal pneumonia without complication. Reactive nodes in the mediastinum.  Possible 3.3 cm incidental thyroid nodule. Recommend nonemergent thyroid US if clinically warranted given patient age. Calcified atherosclerotic change in the nonaneurysmal thoracic aorta. Coronary artery disease. Aortic Atherosclerosis "    Acute on Chronic respiratory failure with hypoxia Chronic systolic CHF > Patient presenting with Respiratory failure requiring 2 L to maintain saturations.  Likely multifactorial in the setting of pneumonia as above and degree of CHF exacerbation. > Details of pneumonia are above.  Noted to have evidence of possible mild CHF exacerbation with BNP elevated to 417 and exam with trace rales but no significant LE edema.  She started having worsening wheezing so she was started on Solu-Medrol and Brovana budesonide given another dose of IV Lasix -BNP elevated to 849.9 > Last echo was 2 months ago with EF 35-40%, indeterminate diastolic function, normal RV function. > No Lasix given initially and in fact fluids were given due to elevated lactic acid.  Received IV Lasix again this a.m. -Start Xopenex and Atrovent and will add Brovana and budesonide - Monitor on telemetry - Supplemental oxygen as needed, wean as tolerated - Strict I's and O's, daily weights - Hold off on repeat echocardiogram as one was done 2 months ago - Strict I's and O's; she is -2.587 L - Check magnesium - Trend renal function and electrolytes - Continue home isosorbide and metoprolol  -She is improving and respiratory status is stable will need  ambulatory home O2 screen prior to discharging and repeat chest x-ray in the morning -CXR this AM done and showed " Evidence of prior median sternotomy/CABG. Stable, moderate severity bilateral multifocal infiltrates."   Hypokalemia -Patient is potassium is 3.5 today -Replete with p.o. KCl 40 milliequivalents -Continue monitor and replete as necessary -Continue monitor and trend and repeat CMP in a.m.   Acute on chronic anemia > Noted to have hemoglobin of 7.8 down from baseline of 10 in the ED. now hemoglobin/hematocrit is 7.8/26.9 today > FOBT was negative.  No overt signs of bleeding. - We will hold blood thinner overnight in case there is any none overt bleeding occurring. - Trend hemoglobin overnight -Iron level was 31, U IBC 02/10/1990, TIBC 223, saturation ratios of 14%, ferritin level of 69 - Type and screen -See below   AKI on CKD Stage 3b > Creatinine elevated to 2.02 from baseline 1.4.  Unclear if this represents dehydration versus volume overload given mild lactic acid elevation as above but also having elevated BNP. > Patient's BUNs/creatinine went from 32/2.02 is now 34/2.05 the day before yesterday and yesterday it is 30/1.83 with today being 39/1.82 -Received IV Lasix the day before yesterday at 20 mg and again yesterday morning at 40 mg - Trend renal function and electrolytes -  Avoid further nephrotoxic medications, contrast dyes, hypotension and dehydration to ensure adequate renal perfusion and renally dose medications -Repeat CMP in a.m.   Carotid artery disease CAD status post CABG History of stroke - Continue home Plavix, Zetia, rosuvastatin - Continue home isosorbide, and metoprolol holding home metoprolol -Continue to monitor and trend   Hypertension  -Received 40 mg of IV Lasix again yesterday but will hold off today -Resume home metoprolol succinate 12.5 TWICE DAILY -Continue monitor blood pressures per protocol   Abnormal LFTs -Improving and AST is now  53 and ALT is now 53 -Continue monitor and trend and repeat CMP in a.m. and if necessary obtain a right upper quadrant ultrasound as well as acute hepatitis panel   Atrial fibrillation - Continue home amiodarone and metoprolol - Holding home Eliquis in the setting of anemia work-up ongoing    Normocytic Anemia -Patient's hemoglobin/hematocrit went from 7.8/25.6 -> 8.2/27.7 -> 8.6/29.0 -> 7.8/26.9 -Check anemia panel and showed an iron level of 31, U IBC of 192, TIBC of 223, saturation ratios of 14%, ferritin level 69 -Continue to monitor for signs and symptoms bleeding; no overt bleeding noted For repeat CBC in a.m.    Diabetes mellitus type II > Chart lists 24 units daily. > 12U long acting Daily - SSI -CBGs ranging from 179-286   OSA - Continue home CPAP   Mild cognitive impairment/dementia - Continue home Donepezil/Memantine Combination pill   GERD - Continue home PPI   Obesity -Complicates overall prognosis and care -Estimated body mass index is 29.32 kg/m as calculated from the following:   Height as of this encounter: 5' (1.524 m).   Weight as of this encounter: 68.1 kg.  -Weight Loss and Dietary Counseling given  DVT prophylaxis: SCDs Start: 09/07/2021 2332    Code Status: Full Code Family Communication: No family currently at bedside  Disposition Plan:  Level of care: Telemetry Status is: Inpatient Remains inpatient appropriate because: Getting treated for her pneumonia and bacteremia and is improving.  We will need to ambulate her and do an ambulatory home O2 screen prior to discharge on her baseline oxygen of 2 L; PT OT recommending home health at discharge and anticipating discharge in the next 24 to 48 hours   Consultants:  None  Procedures:  As above  Antimicrobials:  Anti-infectives (From admission, onward)    Start     Dose/Rate Route Frequency Ordered Stop   09/04/21 1400  azithromycin (ZITHROMAX) 500 mg in sodium chloride 0.9 % 250 mL IVPB   Status:  Discontinued        500 mg 250 mL/hr over 60 Minutes Intravenous Every 24 hours 09/04/21 1126 09/04/21 1129   09/04/21 1400  cefTRIAXone (ROCEPHIN) 2 g in sodium chloride 0.9 % 100 mL IVPB        2 g 200 mL/hr over 30 Minutes Intravenous Every 24 hours 09/04/21 1128     09/04/21 1215  azithromycin (ZITHROMAX) tablet 500 mg        500 mg Oral Daily 09/04/21 1129 09/08/21 0959   08/11/2021 1445  cefTRIAXone (ROCEPHIN) 2 g in sodium chloride 0.9 % 100 mL IVPB  Status:  Discontinued        2 g 200 mL/hr over 30 Minutes Intravenous Every 24 hours 08/25/2021 1440 09/04/21 1128   08/23/2021 1445  azithromycin (ZITHROMAX) 500 mg in sodium chloride 0.9 % 250 mL IVPB  Status:  Discontinued        500 mg 250 mL/hr over 60 Minutes  Intravenous Every 24 hours 09/07/2021 1440 09/04/21 1126       Subjective: Seen and examined at bedside Doing fairly well sitting in the chair at bedside.  She denies chest pain or shortness of breath.  Denies any complaints at this time.  States that she thinks that her breathing is doing fine.  No other concerns or complaints at this time.  Objective: Vitals:   09/06/21 0900 09/06/21 1057 09/06/21 1402 09/06/21 1755  BP:   125/66   Pulse:   75   Resp: (!) 21 (!) 24 15 (!) 23  Temp:   97.7 F (36.5 C)   TempSrc:   Oral   SpO2: 95%  94% 94%  Weight:      Height:        Intake/Output Summary (Last 24 hours) at 09/06/2021 1915 Last data filed at 09/06/2021 1842 Gross per 24 hour  Intake 740 ml  Output 400 ml  Net 340 ml   Filed Weights   09/04/2021 1241 09/04/21 0500 09/05/21 0500  Weight: 71.6 kg 71.7 kg 68.1 kg   Examination: Physical Exam:  Constitutional: WN/WD probably ill-appearing Caucasian female, in no acute distress and is much more awake and alert sitting in the chair Respiratory: Diminished to auscultation bilaterally with coarse breath sounds and has some slight rhonchi and minimal crackles, no wheezing, rales. Normal respiratory effort and  patient is not tachypenic. No accessory muscle use.  Wearing supplemental oxygen via nasal cannula 3 L Cardiovascular: Irregularly irregular, no murmurs / rubs / gallops. S1 and S2 auscultated. .  Abdomen: Soft, non-tender, non-distended. Bowel sounds positive.  GU: Deferred. Musculoskeletal: No clubbing / cyanosis of digits/nails. No joint deformity upper and lower extremities.  Skin: No rashes, lesions, ulcers on limited skin evaluation. No induration; Warm and dry.  Neurologic: CN 2-12 grossly intact with no focal deficits. Romberg sign and cerebellar reflexes not assessed.  Psychiatric: Normal judgment and insight. Alert and oriented x 3. Normal mood and appropriate affect.   Data Reviewed: I have personally reviewed following labs and imaging studies  CBC: Recent Labs  Lab 08/27/2021 1250 09/04/21 0000 09/04/21 0515 09/05/21 0728 09/06/21 0043  WBC 12.3* 12.9* 13.3* 18.7* 13.6*  NEUTROABS  --   --   --   --  12.3*  HGB 7.8* 7.3* 8.2* 8.6* 7.8*  HCT 25.6* 24.9* 27.7* 29.0* 26.9*  MCV 85.6 88.0 87.9 87.1 88.5  PLT 312 278 244 359 528   Basic Metabolic Panel: Recent Labs  Lab 08/17/2021 1250 09/04/21 0000 09/04/21 0515 09/05/21 0728 09/06/21 0043  NA 140  --  142 144 141  K 4.2  --  4.0 3.0* 3.5  CL 103  --  108 104 103  CO2 27  --  '25 30 29  '$ GLUCOSE 149*  --  58* 102* 234*  BUN 32*  --  34* 34* 39*  CREATININE 2.02*  --  2.05* 1.83* 1.82*  CALCIUM 9.0  --  8.5* 8.9 8.5*  MG  --  2.1  --  2.0 2.0  PHOS  --   --   --  3.6 3.0   GFR: Estimated Creatinine Clearance: 19.1 mL/min (A) (by C-G formula based on SCr of 1.82 mg/dL (H)). Liver Function Tests: Recent Labs  Lab 09/07/2021 1250 09/04/21 0515 09/05/21 0728 09/06/21 0043  AST 52* 124* 93* 53*  ALT 32 60* 68* 53*  ALKPHOS 102 108 127* 125  BILITOT 0.9 0.9 1.1 1.0  PROT 6.6 6.2* 6.3* 5.9*  ALBUMIN 3.3* 2.6* 2.5* 2.3*   No results for input(s): "LIPASE", "AMYLASE" in the last 168 hours. No results for input(s):  "AMMONIA" in the last 168 hours. Coagulation Profile: Recent Labs  Lab 08/31/2021 1250  INR 3.5*   Cardiac Enzymes: No results for input(s): "CKTOTAL", "CKMB", "CKMBINDEX", "TROPONINI" in the last 168 hours. BNP (last 3 results) No results for input(s): "PROBNP" in the last 8760 hours. HbA1C: No results for input(s): "HGBA1C" in the last 72 hours. CBG: Recent Labs  Lab 09/05/21 1710 09/05/21 1947 09/06/21 0735 09/06/21 1211 09/06/21 1704  GLUCAP 232* 239* 235* 286* 271*   Lipid Profile: No results for input(s): "CHOL", "HDL", "LDLCALC", "TRIG", "CHOLHDL", "LDLDIRECT" in the last 72 hours. Thyroid Function Tests: No results for input(s): "TSH", "T4TOTAL", "FREET4", "T3FREE", "THYROIDAB" in the last 72 hours. Anemia Panel: Recent Labs    09/04/21 0000  FERRITIN 69  TIBC 223*  IRON 31  RETICCTPCT 3.4*   Sepsis Labs: Recent Labs  Lab 08/17/2021 1435 08/18/2021 1636 08/26/2021 1930  LATICACIDVEN 1.4 2.3* 2.2*    Recent Results (from the past 240 hour(s))  Culture, blood (routine x 2)     Status: Abnormal   Collection Time: 08/10/2021  3:00 PM   Specimen: BLOOD LEFT ARM  Result Value Ref Range Status   Specimen Description   Final    BLOOD LEFT ARM Performed at [facility], [address] [place], [place] [address]    Special Requests   Final    BOTTLES DRAWN AEROBIC AND ANAEROBIC Blood Culture adequate volume Performed at [facility], [address], [place], [place] [address]    Culture  Setup Time   Final    GRAM NEGATIVE RODS ANAEROBIC BOTTLE ONLY CRITICAL RESULT CALLED TO, READ BACK BY AND VERIFIED WITH: C/ PHARMD MJeneen Rinks 09/04/21 0628 A. LAFRANCE Performed at [facility], [place] [address], [place], [place] [address]    Culture ESCHERICHIA COLI (A)  Final   Report Status 09/06/2021 FINAL  Final   Organism ID, Bacteria ESCHERICHIA COLI  Final      Susceptibility   Escherichia coli - MIC*    AMPICILLIN >=32  RESISTANT Resistant     CEFAZOLIN >=64 RESISTANT Resistant     CEFEPIME <=0.12 SENSITIVE Sensitive     CEFTAZIDIME 4 SENSITIVE Sensitive     CEFTRIAXONE 0.5 SENSITIVE Sensitive     CIPROFLOXACIN <=0.25 SENSITIVE Sensitive     GENTAMICIN <=1 SENSITIVE Sensitive     IMIPENEM <=0.25 SENSITIVE Sensitive     TRIMETH/SULFA <=20 SENSITIVE Sensitive     AMPICILLIN/SULBACTAM >=32 RESISTANT Resistant     PIP/TAZO <=4 SENSITIVE Sensitive     * ESCHERICHIA COLI  Blood Culture ID Panel (Reflexed)     Status: Abnormal   Collection Time: 08/24/2021  3:00 PM  Result Value Ref Range Status   Enterococcus faecalis NOT DETECTED NOT DETECTED Final   Enterococcus Faecium NOT DETECTED NOT DETECTED Final   Listeria monocytogenes NOT DETECTED NOT DETECTED Final   Staphylococcus species NOT DETECTED NOT DETECTED Final   Staphylococcus aureus (BCID) NOT DETECTED NOT DETECTED Final   Staphylococcus epidermidis NOT DETECTED NOT DETECTED Final   Staphylococcus lugdunensis NOT DETECTED NOT DETECTED Final   Streptococcus species NOT DETECTED NOT DETECTED Final   Streptococcus agalactiae NOT DETECTED NOT DETECTED Final   Streptococcus pneumoniae NOT DETECTED NOT DETECTED Final   Streptococcus pyogenes NOT DETECTED NOT DETECTED Final   A.calcoaceticus-baumannii NOT DETECTED NOT DETECTED Final   Bacteroides fragilis  NOT DETECTED NOT DETECTED Final   Enterobacterales DETECTED (A) NOT DETECTED Final    Comment: Enterobacterales represent a large order of gram negative bacteria, not a single organism. CRITICAL RESULT CALLED TO, READ BACK BY AND VERIFIED WITH: C/ PHARMD M. JAMES 09/04/21 0628 A. LAFRANCE    Enterobacter cloacae complex NOT DETECTED NOT DETECTED Final   Escherichia coli DETECTED (A) NOT DETECTED Final    Comment: CRITICAL RESULT CALLED TO, READ BACK BY AND VERIFIED WITH: C/ PHARMD M. JAMES 09/04/21 0628 A. LAFRANCE    Klebsiella aerogenes NOT DETECTED NOT DETECTED Final   Klebsiella oxytoca NOT  DETECTED NOT DETECTED Final   Klebsiella pneumoniae NOT DETECTED NOT DETECTED Final   Proteus species NOT DETECTED NOT DETECTED Final   Salmonella species NOT DETECTED NOT DETECTED Final   Serratia marcescens NOT DETECTED NOT DETECTED Final   Haemophilus influenzae NOT DETECTED NOT DETECTED Final   Neisseria meningitidis NOT DETECTED NOT DETECTED Final   Pseudomonas aeruginosa NOT DETECTED NOT DETECTED Final   Stenotrophomonas maltophilia NOT DETECTED NOT DETECTED Final   Candida albicans NOT DETECTED NOT DETECTED Final   Candida auris NOT DETECTED NOT DETECTED Final   Candida glabrata NOT DETECTED NOT DETECTED Final   Candida krusei NOT DETECTED NOT DETECTED Final   Candida parapsilosis NOT DETECTED NOT DETECTED Final   Candida tropicalis NOT DETECTED NOT DETECTED Final   Cryptococcus neoformans/gattii NOT DETECTED NOT DETECTED Final   CTX-M ESBL NOT DETECTED NOT DETECTED Final   Carbapenem resistance IMP NOT DETECTED NOT DETECTED Final   Carbapenem resistance KPC NOT DETECTED NOT DETECTED Final   Carbapenem resistance NDM NOT DETECTED NOT DETECTED Final   Carbapenem resist OXA 48 LIKE NOT DETECTED NOT DETECTED Final   Carbapenem resistance VIM NOT DETECTED NOT DETECTED Final    Comment: Performed at [facility], [address], [place], [place] [address]  Culture, blood (routine x 2)     Status: None (Preliminary result)   Collection Time: 08/10/2021  3:24 PM   Specimen: BLOOD RIGHT WRIST  Result Value Ref Range Status   Specimen Description   Final    BLOOD RIGHT WRIST Performed at [facility], [address], [place], [place] [address]    Special Requests   Final    BOTTLES DRAWN AEROBIC AND ANAEROBIC Blood Culture adequate volume Performed at [facility], [address], [place], [place] [address]    Culture   Final    NO GROWTH 3 DAYS Performed at [facility], [place] [address], [place], [place] [address]    Report Status  PENDING  Incomplete  Resp Panel by RT-PCR (Flu A&B, Covid) Urine, Clean Catch     Status: None   Collection Time: 08/29/2021  4:36 PM   Specimen: Urine, Clean Catch; Nasal Swab  Result Value Ref Range Status   SARS Coronavirus 2 by RT PCR NEGATIVE NEGATIVE Final    Comment: (NOTE) SARS-CoV-2 target nucleic acids are NOT DETECTED.  The SARS-CoV-2 RNA is generally detectable in upper respiratory specimens during the acute phase of infection. The lowest concentration of SARS-CoV-2 viral copies this assay can detect is 138 copies/mL. A negative result does not preclude SARS-Cov-2 infection and should not be used as the sole basis for treatment or other patient management decisions. A negative result may occur with  improper specimen collection/handling, submission of specimen other than nasopharyngeal swab, presence of viral mutation(s) within the areas targeted by this assay, and inadequate number of viral  copies(<138 copies/mL). A negative result must be combined with clinical observations, patient history, and epidemiological information. The expected result is Negative.  Fact Sheet for Patients:  EntrepreneurPulse.com.au  Fact Sheet for Healthcare Providers:  IncredibleEmployment.be  This test is no t yet approved or cleared by the [place] FDA and  has been authorized for detection and/or diagnosis of SARS-CoV-2 by FDA under an Emergency Use Authorization (EUA). This EUA will remain  in effect (meaning this test can be used) for the duration of the COVID-19 declaration under Section 564(b)(1) of the Act, 21 U.S.C.section 360bbb-3(b)(1), unless the authorization is terminated  or revoked sooner.       Influenza A by PCR NEGATIVE NEGATIVE Final   Influenza B by PCR NEGATIVE NEGATIVE Final    Comment: (NOTE) The Xpert Xpress SARS-CoV-2/FLU/RSV plus assay is intended as an aid in the diagnosis of influenza from Nasopharyngeal swab specimens  and should not be used as a sole basis for treatment. Nasal washings and aspirates are unacceptable for Xpert Xpress SARS-CoV-2/FLU/RSV testing.  Fact Sheet for Patients: EntrepreneurPulse.com.au  Fact Sheet for Healthcare Providers: IncredibleEmployment.be  This test is not yet approved or cleared by the [place] FDA and has been authorized for detection and/or diagnosis of SARS-CoV-2 by FDA under an Emergency Use Authorization (EUA). This EUA will remain in effect (meaning this test can be used) for the duration of the COVID-19 declaration under Section 564(b)(1) of the Act, 21 U.S.C. section 360bbb-3(b)(1), unless the authorization is terminated or revoked.  Performed at [facility], [address], [place], [place] [address]   Culture, blood (Routine X 2) w Reflex to ID Panel     Status: None (Preliminary result)   Collection Time: 09/06/21 12:43 AM   Specimen: BLOOD  Result Value Ref Range Status   Specimen Description   Final    BLOOD BLOOD RIGHT ARM Performed at [facility] [address], [place], [place] [address]    Special Requests   Final    BOTTLES DRAWN AEROBIC ONLY Blood Culture adequate volume Performed at [facility] [address] [place], [place] [address]    Culture   Final    NO GROWTH < 12 HOURS Performed at [facility] [address], [place], [place] [address]    Report Status PENDING  Incomplete  Culture, blood (Routine X 2) w Reflex to ID Panel     Status: None (Preliminary result)   Collection Time: 09/06/21 12:48 AM   Specimen: BLOOD  Result Value Ref Range Status   Specimen Description   Final    BLOOD RIGHT ANTECUBITAL Performed at [facility] [address], [place], [place] [address]    Special Requests   Final    BOTTLES DRAWN AEROBIC ONLY Blood Culture adequate volume Performed at [facility] [address], [place], [place] [address]    Culture   Final    NO GROWTH < 12 HOURS Performed at [facility] [address] [place], [place] [address]    Report Status PENDING  Incomplete     Radiology Studies: DG CHEST PORT 1 VIEW  Result Date: 09/06/2021 CLINICAL DATA:  Follow-up pneumonia. EXAM: PORTABLE CHEST 1 VIEW COMPARISON:  September 05, 2021 FINDINGS: Multiple sternal wires and vascular clips are seen. The heart size and mediastinal contours are within normal limits. A radiopaque loop recorder device is seen. There is moderate severity calcification of the aortic arch.  Stable, moderate severity bilateral multifocal infiltrates are seen. There is no evidence of a pleural effusion or pneumothorax. The visualized skeletal structures are unremarkable. IMPRESSION: 1. Evidence of prior median sternotomy/CABG. 2. Stable, moderate severity bilateral multifocal infiltrates. Electronically Signed   By: Virgina Norfolk M.D.   On: 09/06/2021 02:56   DG CHEST PORT 1 VIEW  Result Date: 09/05/2021 CLINICAL DATA:  Shortness of breath EXAM: PORTABLE CHEST 1 VIEW COMPARISON:  September 04, 2021 FINDINGS: Multifocal pneumonia remains, similar on the left and mildly worsened on the right. Infiltrate is now projected over the right base. The cardiomediastinal silhouette is stable. No pneumothorax. No other abnormalities. IMPRESSION: Mild worsening of multifocal pneumonia. Electronically Signed   By: Dorise Bullion III M.D.   On: 09/05/2021 09:35   DG CHEST PORT 1 VIEW  Result Date: 09/04/2021 CLINICAL DATA:  Shortness of breath EXAM: PORTABLE CHEST 1 VIEW COMPARISON:  CT chest dated 09/04/2021 FINDINGS: Multifocal patchy opacities, right upper lobe predominant, compatible with pneumonia. This is better evaluated on recent CT. Mild cardiomegaly. Median sternotomy. IMPRESSION: Multifocal pneumonia, right upper lobe predominant, better evaluated on recent CT. Electronically Signed   By:  Julian Hy M.D.   On: 09/04/2021 21:48     Scheduled Meds:  amiodarone  200 mg Oral Daily   arformoterol  15 mcg Nebulization BID   azithromycin  500 mg Oral Daily   budesonide (PULMICORT) nebulizer solution  0.25 mg Nebulization BID   clopidogrel  75 mg Oral Daily   memantine  28 mg Oral QHS   And   donepezil  10 mg Oral QHS   DULoxetine  60 mg Oral Daily   ezetimibe  10 mg Oral Daily   gabapentin  300 mg Oral BID   insulin aspart  0-15 Units Subcutaneous TID WC   insulin glargine-yfgn  12 Units Subcutaneous Daily   ipratropium  0.5 mg Nebulization BID   isosorbide dinitrate  30 mg Oral BID   levalbuterol  0.63 mg Nebulization BID   methylPREDNISolone (SOLU-MEDROL) injection  40 mg Intravenous BID   metoprolol succinate  12.5 mg Oral Daily   pantoprazole  40 mg Oral Daily   rosuvastatin  10 mg Oral QAC supper   sodium chloride flush  3 mL Intravenous Q12H   Continuous Infusions:  cefTRIAXone (ROCEPHIN)  IV 2 g (09/06/21 1600)    LOS: 3 days   Raiford Noble, DO [facility] Available via Epic secure chat 7am-7pm After these hours, please refer to coverage provider listed on [email or web address] 09/06/2021, 7:15 PM

## 2021-09-06 NOTE — TOC Initial Note (Addendum)
Transition of Care [facility]) - Initial/Assessment Note    Patient Details  Name: Linda Oliver MRN: [id number] Date of Birth: Sep 21, 1932  [facility] CM/SW Contact:    Roseanne Kaufman, RN Phone Number: 09/06/2021, 4:32 PM  Clinical Narrative:     Spoke with patient's daughter Tasia Catchings, who request patient to resume HHPT with [facility]. Notified Kelly with [place].  Transport: daughter will transport at discharge [facility]acy[facility] [address] TOC will continue to follow.              Expected Discharge Plan: [place] Barriers to Discharge: Continued Medical Work up   Patient Goals and CMS Choice Patient states their goals for this hospitalization and ongoing recovery are:: go home to feel better CMS Medicare.gov Compare Post Acute Care list provided to:: Patient Represenative (must comment) Tasia Catchings (daughter)) Choice offered to / list presented to : Adult Children  Expected Discharge Plan and Services Expected Discharge Plan: [place] In-house Referral: NA Discharge Planning Services: CM Consult Post Acute Care Choice: [place] arrangements for the past 2 months: Single Family Home                 DME Arranged: N/A         HH Arranged: PT HH Agency: [place] Date [place]: 09/06/21 Time [place]: 1631 Representative spoke with at [place]: Claiborne Billings  Prior Living Arrangements/Services Living arrangements for the past 2 months: [place]   Patient language and need for interpreter reviewed:: Yes Do you feel safe going back to the place where you live?: Yes      Need for Family Participation in Patient Care: Yes (Comment) Care giver support system in place?: Yes (comment) Current home services: Home PT Criminal Activity/Legal Involvement Pertinent to Current Situation/Hospitalization: No - Comment as needed  Activities of  Daily Living Home Assistive Devices/Equipment: Walker (specify type) ADL Screening (condition at time of admission) Patient's cognitive ability adequate to safely complete daily activities?: No Is the patient deaf or have difficulty hearing?: No Does the patient have difficulty seeing, even when wearing glasses/contacts?: Yes Does the patient have difficulty concentrating, remembering, or making decisions?: Yes Patient able to express need for assistance with ADLs?: Yes Does the patient have difficulty dressing or bathing?: Yes Independently performs ADLs?: No Communication: Appropriate for developmental age Dressing (OT): Needs assistance Is this a change from baseline?: Change from baseline, expected to last <3days Grooming: Needs assistance Is this a change from baseline?: Change from baseline, expected to last <3 days Feeding: Needs assistance Is this a change from baseline?: Change from baseline, expected to last <3 days Bathing: Needs assistance Is this a change from baseline?: Change from baseline, expected to last <3 days Toileting: Needs assistance Is this a change from baseline?: Change from baseline, expected to last <3 days In/Out Bed: Needs assistance Is this a change from baseline?: Change from baseline, expected to last <3 days Walks in Home: Needs assistance Is this a change from baseline?: Change from baseline, expected to last <3 days Does the patient have difficulty walking or climbing stairs?: Yes Weakness of Legs: Both Weakness of Arms/Hands: Both  Permission Sought/Granted Permission sought to share information with : Case Manager Permission granted to share information with : Yes, Verbal Permission Granted  Share Information with NAME: Case manager           Emotional Assessment  Appearance:: Appears stated age Attitude/Demeanor/Rapport: Gracious Affect (typically observed): Accepting Orientation: : Oriented to Self Alcohol / Substance Use: Not  Applicable Psych Involvement: No (comment)  Admission diagnosis:  Encephalopathy [G93.40] Multifocal pneumonia [J18.9] Community acquired pneumonia, unspecified laterality [J18.9] Sepsis, due to unspecified organism, unspecified whether acute organ dysfunction present [facility]) [A41.9] Patient Active Problem List   Diagnosis Date Noted   Multifocal pneumonia 08/14/2021   Acute respiratory failure with hypoxia ([place]) 08/10/2021   Chronic anemia 07/13/2021   Acute kidney injury superimposed on chronic kidney disease ([place]) 07/11/2021   Lobar pneumonia ([place]) 07/10/2021   UTI (urinary tract infection) 07/09/2021   Acute on chronic diastolic CHF (congestive heart failure) ([place]) 07/07/2021   NSTEMI (non-ST elevated myocardial infarction) ([place]) 07/07/2021   Posterior capsular opacification, left 06/15/2021   MCI (mild cognitive impairment) 06/09/2021   Excessive daytime sleepiness 05/17/2021   OSA on CPAP 05/17/2021   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/13/2021   Acute on chronic systolic CHF (congestive heart failure) ([place]) 01/14/2021   Amaurosis fugax of left eye 04/27/2020   History of vitrectomy 03/03/2020   Right epiretinal membrane 03/03/2020   Posterior vitreous detachment of right eye 08/06/2019   Paroxysmal atrial fibrillation ([place]) 04/17/2019   Vitreous hemorrhage of left eye ([place]) 04/15/2019   Encounter for loop recorder check 04/11/2019   Loop recorder Medtronic Reveal LINQ  08/24/2018 04/11/2019   H/O: stroke: Left occipital stroke and vision loss 08/24/2018   Autonomic orthostatic hypotension 02/15/2018   Carotid stenosis, asymptomatic, bilateral 02/15/2018   Subjective memory complaints 11/19/2017   Hereditary and idiopathic peripheral neuropathy 02/19/2014   Coronary artery disease without angina pectoris    Type 2 diabetes mellitus with hyperlipidemia (HCC)    Essential hypertension    Obesity (BMI 30-39.9)    GERD (gastroesophageal reflux disease)    Bullous  pemphigus    Diabetic peripheral neuropathy (HCC)    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

## 2021-09-06 NOTE — Progress Notes (Signed)
Mobility Specialist - Progress Note     09/06/21 1228  Oxygen Therapy  O2 Device Nasal Cannula  O2 Flow Rate (L/min) 2 L/min  Mobility  Activity Ambulated with assistance in hallway  Range of Motion/Exercises Active  Level of Assistance Contact guard assist, steadying assist  Assistive Device Front wheel walker  Distance Ambulated (ft) 20 ft  Activity Response Tolerated well  $Mobility charge 1 Mobility   Pt was found on BSC and agreeable to mobilize. Pt stated having weak knees while ambulating. Pt was left on recliner chair with all necessities in reach and chair alarm on.   Ferd Hibbs Mobility Specialist

## 2021-09-07 ENCOUNTER — Inpatient Hospital Stay (HOSPITAL_COMMUNITY): Payer: Medicare Other

## 2021-09-07 DIAGNOSIS — N179 Acute kidney failure, unspecified: Secondary | ICD-10-CM | POA: Diagnosis not present

## 2021-09-07 DIAGNOSIS — I5023 Acute on chronic systolic (congestive) heart failure: Secondary | ICD-10-CM | POA: Diagnosis not present

## 2021-09-07 DIAGNOSIS — J189 Pneumonia, unspecified organism: Secondary | ICD-10-CM | POA: Diagnosis not present

## 2021-09-07 DIAGNOSIS — J9601 Acute respiratory failure with hypoxia: Secondary | ICD-10-CM | POA: Diagnosis not present

## 2021-09-07 LAB — CBC WITH DIFFERENTIAL/PLATELET
Abs Immature Granulocytes: 0.11 10*3/uL — ABNORMAL HIGH (ref 0.00–0.07)
Basophils Absolute: 0 10*3/uL (ref 0.0–0.1)
Basophils Relative: 0 %
Eosinophils Absolute: 0 10*3/uL (ref 0.0–0.5)
Eosinophils Relative: 0 %
HCT: 25.7 % — ABNORMAL LOW (ref 36.0–46.0)
Hemoglobin: 8 g/dL — ABNORMAL LOW (ref 12.0–15.0)
Immature Granulocytes: 1 %
Lymphocytes Relative: 3 %
Lymphs Abs: 0.5 10*3/uL — ABNORMAL LOW (ref 0.7–4.0)
MCH: 26.1 pg (ref 26.0–34.0)
MCHC: 31.1 g/dL (ref 30.0–36.0)
MCV: 83.7 fL (ref 80.0–100.0)
Monocytes Absolute: 0.8 10*3/uL (ref 0.1–1.0)
Monocytes Relative: 5 %
Neutro Abs: 14 10*3/uL — ABNORMAL HIGH (ref 1.7–7.7)
Neutrophils Relative %: 91 %
Platelets: 334 10*3/uL (ref 150–400)
RBC: 3.07 MIL/uL — ABNORMAL LOW (ref 3.87–5.11)
RDW: 17.2 % — ABNORMAL HIGH (ref 11.5–15.5)
WBC: 15.5 10*3/uL — ABNORMAL HIGH (ref 4.0–10.5)
nRBC: 0.3 % — ABNORMAL HIGH (ref 0.0–0.2)

## 2021-09-07 LAB — COMPREHENSIVE METABOLIC PANEL
ALT: 47 U/L — ABNORMAL HIGH (ref 0–44)
AST: 43 U/L — ABNORMAL HIGH (ref 15–41)
Albumin: 2.3 g/dL — ABNORMAL LOW (ref 3.5–5.0)
Alkaline Phosphatase: 141 U/L — ABNORMAL HIGH (ref 38–126)
Anion gap: 10 (ref 5–15)
BUN: 53 mg/dL — ABNORMAL HIGH (ref 8–23)
CO2: 26 mmol/L (ref 22–32)
Calcium: 8.9 mg/dL (ref 8.9–10.3)
Chloride: 102 mmol/L (ref 98–111)
Creatinine, Ser: 1.9 mg/dL — ABNORMAL HIGH (ref 0.44–1.00)
GFR, Estimated: 25 mL/min — ABNORMAL LOW (ref >60)
Glucose, Bld: 283 mg/dL — ABNORMAL HIGH (ref 70–99)
Potassium: 3.5 mmol/L (ref 3.5–5.1)
Sodium: 138 mmol/L (ref 135–145)
Total Bilirubin: 0.8 mg/dL (ref 0.3–1.2)
Total Protein: 6.2 g/dL — ABNORMAL LOW (ref 6.5–8.1)

## 2021-09-07 LAB — GLUCOSE, CAPILLARY
Glucose-Capillary: 283 mg/dL — ABNORMAL HIGH (ref 70–99)
Glucose-Capillary: 297 mg/dL — ABNORMAL HIGH (ref 70–99)
Glucose-Capillary: 293 mg/dL — ABNORMAL HIGH (ref 70–99)
Glucose-Capillary: 170 mg/dL — ABNORMAL HIGH (ref 70–99)

## 2021-09-07 LAB — MAGNESIUM: Magnesium: 2.3 mg/dL (ref 1.7–2.4)

## 2021-09-07 LAB — PHOSPHORUS: Phosphorus: 3.9 mg/dL (ref 2.5–4.6)

## 2021-09-07 MED ORDER — POTASSIUM CHLORIDE CRYS ER 20 MEQ PO TBCR
40.0000 meq | EXTENDED_RELEASE_TABLET | Freq: Two times a day (BID) | ORAL | Status: AC
  Administered 2021-09-07 – 2021-09-08 (×2): 40 meq via ORAL
  Filled 2021-09-07 (×2): qty 2

## 2021-09-07 MED ORDER — FUROSEMIDE 10 MG/ML IJ SOLN
40.0000 mg | Freq: Once | INTRAMUSCULAR | Status: AC
  Administered 2021-09-07: 40 mg via INTRAVENOUS
  Filled 2021-09-07: qty 4

## 2021-09-07 MED ORDER — METHYLPREDNISOLONE SODIUM SUCC 40 MG IJ SOLR
40.0000 mg | INTRAMUSCULAR | Status: DC
  Administered 2021-09-08: 40 mg via INTRAVENOUS
  Filled 2021-09-07: qty 1

## 2021-09-07 MED ORDER — INSULIN GLARGINE-YFGN 100 UNIT/ML ~~LOC~~ SOLN
15.0000 [IU] | Freq: Every day | SUBCUTANEOUS | Status: DC
  Administered 2021-09-07 – 2021-09-10 (×4): 15 [IU] via SUBCUTANEOUS
  Filled 2021-09-07 (×4): qty 0.15

## 2021-09-07 NOTE — Progress Notes (Signed)
PROGRESS NOTE    Linda Oliver  DHR:[id number] DOB: 1932-03-18 DOA: 08/14/2021 PCP: Shon Baton, MD   Brief Narrative:  HPI per Dr. Neva Seat on 09/05/2021  HPI: Linda Oliver is a 88 y.o. female with medical history significant of diabetes, neuropathy, GERD, obesity, carotid artery disease, CAD status post CABG, CVA, CHF, hypertension, atrial fibrillation, OSA on CPAP, mild cognitive impairment, anemia presenting with altered mental status.   Patient presenting with encephalopathy so history obtained with assistance of daughter and chart review.  Patient currently lives with her daughter who has noticed a decline in the past 5 days.  Patient now altered and weak.  Daughter reported patient has been talking to people who are not there and has been mumbling a lot.  She also has been less communicative with her daughter compared to her baseline.  She is had decreased p.o. intake and decreased urination.  She has slid out of her bed a couple of times as she does not seem to have the strength to stand/walk.  Patient was taken to their PCP yesterday but they were unable to get blood work or urine there and patient was started on Keflex in case she was developing UTI.   Unable to obtain full review of systems due to patient's encephalopathy/dementia.   ED Course: Vital signs in the ED significant for fever to 100.5, heart rate in the 50s to 60s, blood pressure in the 468E to 321Y systolic, respiratory rate in the teens to 20s requiring 2 L to maintain saturations and desaturating to upper 80s on room air.  Lab work-up included CMP with BUN of 32, creatinine 2.02 from baseline of 1.4, glucose 149, albumin 3.3, AST 52.  CBC with leukocytosis to 12.3, hemoglobin 7.8 down from baseline of around 10 with low normal MCV.  PT and INR elevated at 35.1 and 3.5 respectively.  BNP elevated to 417.  FOBT negative.  Lactic acid normal, 2.3, 2.2 on repeat's.  Respiratory panel flu and COVID-negative.  Urinalysis with  glucose and protein only.  Blood cultures pending.  CT head showed no acute abnormality.  Chest x-ray showed findings consistent with increasing multifocal pneumonia.  Patient received ceftriaxone azithromycin in the ED as well as Tylenol and a liter of fluids.   **Interim History The work-up reveals that she has an E. coli bacteremia and will obtain a CT of the chest without contrast to further evaluate for her pneumonia and it shows a multifocal pneumonia.  She is improving but she was a little delirious today and was sundowning.  Given her worsening respiratory status she was given some IV Solu-Medrol and added some Brovana and budesonide.  We will repeat her blood cultures and continue to treat her for her multifocal pneumonia and given her volume overload she was received another dose of IV Lasix yesterday but will hold off further doses today given that she appears euvolemic.   She appears to be improving significantly and E. coli sensitivities came back and they are sensitive to everything except ampicillin, ampicillin/sulbactam and cefazolin.  We will continue the ceftriaxone and azithromycin for her pneumonia as she is improving.  She will need an ambulatory home O2 screen and continued monitoring of her respiratory status carefully and likely can be D/C'd in the next 24-48 hours. Will give her another dose of IV Lasix 40 mg x1.   Assessment and Plan:  E. coli bacteremia Multifocal pneumonia Acute encephalopathy > Patient presenting with general decline x5 days per daughter  with weakness and confusion. > Found to have evidence of multifocal pneumonia on chest x-ray with leukocytosis 12.3 and acute on chronic oxygen requirement; now WBC is trending up again and went from 12.9 -> 13.3 -> 18.7 -> 13.6 -> 15.5 > Cultures x2 show positive for E. coli: Repeat blood cultures show NGTD at 1 Day  > Initial lactic acid normal followed by mildly elevated at 2.3 followed by improved to 2.2.  Received a  liter of fluids in the ED. > Started on ceftriaxone and azithromycin in the ED and continuing and can be transitioned to po Levofloxacin q48h at D/C  - Monitor on telemetry - Continue with ceftriaxone and azithromycin; sensitivities of E. coli are back now and she is pansensitive except resistant to ampicillin, ampicillin sulbactam, and cefazolin - Continue supplemental oxygen, wean as tolerated - Trend fever curve and WBC - Follow-up blood cultures and repeat them if necessary -Check CT of the chest without contrast given renal dysfunction; Showed "Multifocal pneumonia without complication. Reactive nodes in the mediastinum.  Possible 3.3 cm incidental thyroid nodule. Recommend nonemergent thyroid US if clinically warranted given patient age. Calcified atherosclerotic change in the nonaneurysmal thoracic aorta. Coronary artery disease. Aortic Atherosclerosis " -Repeat CXR in the AM and Ambulatory Home O2 Screen     Acute on Chronic respiratory failure with hypoxia Acute on Chronic systolic CHF > Patient presenting with Respiratory failure requiring 2 L to maintain saturations.  Likely multifactorial in the setting of pneumonia as above and degree of CHF exacerbation. > Details of pneumonia are above.  Noted to have evidence of possible mild CHF exacerbation with BNP elevated to 417 and exam with trace rales but no significant LE edema.  She started having worsening wheezing so she was started on Solu-Medrol and Brovana budesonide and weaning IV Solumedrol from 40 mg BID to Daily  -BNP elevated to 849.9 > Last echo was 2 months ago with EF 35-40%, indeterminate diastolic function, normal RV function. > Given another dose of IV lasix this AM  -Start Xopenex and Atrovent and will add Brovana and budesonide - Monitor on telemetry - Supplemental oxygen as needed, wean as tolerated - Strict I's and O's, daily weights - Hold off on repeat echocardiogram as one was done 2 months ago - Strict I's and  O's; she is -1.867 L - Check magnesium - Trend renal function and electrolytes - Continue home isosorbide and metoprolol  -She is improving and respiratory status is stable will need ambulatory home O2 screen prior to discharging and repeat chest x-ray in the morning -CXR this AM done and showed " Unchanged diffuse bilateral airspace opacities, most consistent with multifocal pneumonia."   Hypokalemia -Patient is potassium is 3.5 today -Replete with p.o. KCl 40 milliequivalents BID again  -Continue monitor and replete as necessary -Continue monitor and trend and repeat CMP in a.m.   Acute on chronic anemia > Noted to have hemoglobin of 7.8 down from baseline of 10 in the ED. now hemoglobin/hematocrit is 8.0/25.7 > FOBT was negative.  No overt signs of bleeding. - We will hold blood thinner overnight in case there is any none overt bleeding occurring. - Trend hemoglobin overnight -Iron level was 31, U IBC 02/10/1990, TIBC 223, saturation ratios of 14%, ferritin level of 69 - Type and screen -See below   AKI on CKD Stage 3b > Creatinine elevated to 2.02 from baseline 1.4.  Unclear if this represents dehydration versus volume overload given mild lactic acid elevation as above  but also having elevated BNP. > Patient's BUNs/creatinine went from 32/2.02 -> 34/2.05 -> 30/1.83 -> 39/1.82 -> 53/1.90 -Will order IV Lasix 40 mg again today  - Trend renal function and electrolytes -Avoid further nephrotoxic medications, contrast dyes, hypotension and dehydration to ensure adequate renal perfusion and renally dose medications -Repeat CMP in a.m.   Carotid artery disease CAD status post CABG History of stroke - Continue home Plavix, Zetia, rosuvastatin - Continue home isosorbide, and metoprolol holding home metoprolol -Continue to monitor and trend   Hypertension  -Received 40 mg of IV Lasix previously and will repeat again today  -Resume home metoprolol succinate 12.5 TWICE DAILY -Continue  monitor blood pressures per protocol   Abnormal LFTs -Improving and AST is now improving and is 43 and ALT is now 47 -Continue monitor and trend and repeat CMP in a.m. and if necessary obtain a right upper quadrant ultrasound as well as acute hepatitis panel   Atrial fibrillation - Continue home amiodarone and metoprolol - Holding home Eliquis in the setting of anemia work-up ongoing    Normocytic Anemia -Patient's hemoglobin/hematocrit went from 7.8/25.6 -> 8.2/27.7 -> 8.6/29.0 -> 7.8/26.9 -> 8.0/25.7 -Check anemia panel and showed an iron level of 31, U IBC of 192, TIBC of 223, saturation ratios of 14%, ferritin level 69 -Continue to monitor for signs and symptoms bleeding; no overt bleeding noted For repeat CBC in a.m.    Diabetes mellitus type II > Chart lists 24 units daily. > 12U long acting Daily increased to 15 years daily - SSI -If continues to be elevated will consider adding NovoLog 2 to 3 units 3 times daily with meals if she eats greater than 50% of the meals -CBGs ranging from 283-297   OSA - Continue home CPAP   Mild cognitive impairment/dementia - Continue home Donepezil/Memantine Combination pill   GERD - Continue home PPI   Obesity -Complicates overall prognosis and care -Estimated body mass index is 29.32 kg/m as calculated from the following:   Height as of this encounter: 5' (1.524 m).   Weight as of this encounter: 68.1 kg.  -Weight Loss and Dietary Counseling given  DVT prophylaxis: SCDs Start: 08/30/2021 2332    Code Status: Full Code Family Communication: Discussed with Husband at bedside   Disposition Plan:  Level of care: Telemetry Status is: Inpatient Remains inpatient appropriate because: Anticipating D/C in the next 24-48 hours if stable and Respiratory Status is improved.    Consultants:  None  Procedures:  As Above  Antimicrobials:  Anti-infectives (From admission, onward)    Start     Dose/Rate Route Frequency Ordered Stop    09/04/21 1400  azithromycin (ZITHROMAX) 500 mg in sodium chloride 0.9 % 250 mL IVPB  Status:  Discontinued        500 mg 250 mL/hr over 60 Minutes Intravenous Every 24 hours 09/04/21 1126 09/04/21 1129   09/04/21 1400  cefTRIAXone (ROCEPHIN) 2 g in sodium chloride 0.9 % 100 mL IVPB        2 g 200 mL/hr over 30 Minutes Intravenous Every 24 hours 09/04/21 1128     09/04/21 1215  azithromycin (ZITHROMAX) tablet 500 mg        500 mg Oral Daily 09/04/21 1129 09/07/21 0925   08/21/2021 1445  cefTRIAXone (ROCEPHIN) 2 g in sodium chloride 0.9 % 100 mL IVPB  Status:  Discontinued        2 g 200 mL/hr over 30 Minutes Intravenous Every 24 hours 09/02/2021 1440 09/04/21  1128   08/14/2021 1445  azithromycin (ZITHROMAX) 500 mg in sodium chloride 0.9 % 250 mL IVPB  Status:  Discontinued        500 mg 250 mL/hr over 60 Minutes Intravenous Every 24 hours 08/14/2021 1440 09/04/21 1126       Subjective: Seen and examined at bedside and she is sitting in chair and fluids feeling better.  Continues to have a little bit of volume overload we will give her some IV Lasix again.  We will repeat her ambulatory home O2 screen in the morning.  She is improving and wheezing is improving as well but Her chest x-ray appears about the same. No other concerns or close at this time.  Objective: Vitals:   09/07/21 0750 09/07/21 0755 09/07/21 1153 09/07/21 1637  BP:  (!) 146/46 (!) 124/34   Pulse:  82 80   Resp:  (!) 22 20   Temp:  (!) 97.5 F (36.4 C) 97.8 F (36.6 C)   TempSrc:  Oral Oral   SpO2: 91% 93% (!) 89% 95%  Weight:      Height:        Intake/Output Summary (Last 24 hours) at 09/07/2021 1640 Last data filed at 09/07/2021 1247 Gross per 24 hour  Intake 1120 ml  Output --  Net 1120 ml   Filed Weights   08/15/2021 1241 09/04/21 0500 09/05/21 0500  Weight: 71.6 kg 71.7 kg 68.1 kg   Examination: Physical Exam:  Constitutional: WN/WD, ill-appearing Caucasian female in no acute distress and sitting in a  chair Respiratory: Diminished to auscultation bilaterally with coarse breath sounds and has some rhonchi and some slight crackles.  Has unlabored breathing and is wearing 2 L of supplemental oxygen via nasal cannula. Cardiovascular: Irregularly irregular, no murmurs / rubs / gallops. S1 and S2 auscultated.  Has mild 1+ lower extremity edema Abdomen: Soft, non-tender, non-distended. Bowel sounds positive.  GU: Deferred. Musculoskeletal: No clubbing / cyanosis of digits/nails. No joint deformity upper and lower extremities.  Skin: No rashes, lesions, ulcers on limited skin evaluation. No induration; Warm and dry.  Neurologic: CN 2-12 grossly intact with no focal deficits. Romberg sign and cerebellar reflexes not assessed.  Psychiatric: Normal judgment and insight. Alert and oriented x 3. Normal mood and appropriate affect.   Data Reviewed: I have personally reviewed following labs and imaging studies  CBC: Recent Labs  Lab 09/04/21 0000 09/04/21 0515 09/05/21 0728 09/06/21 0043 09/07/21 0431  WBC 12.9* 13.3* 18.7* 13.6* 15.5*  NEUTROABS  --   --   --  12.3* 14.0*  HGB 7.3* 8.2* 8.6* 7.8* 8.0*  HCT 24.9* 27.7* 29.0* 26.9* 25.7*  MCV 88.0 87.9 87.1 88.5 83.7  PLT 278 244 359 305 967   Basic Metabolic Panel: Recent Labs  Lab 08/24/2021 1250 09/04/21 0000 09/04/21 0515 09/05/21 0728 09/06/21 0043 09/07/21 0431  NA 140  --  142 144 141 138  K 4.2  --  4.0 3.0* 3.5 3.5  CL 103  --  108 104 103 102  CO2 27  --  '25 30 29 26  '$ GLUCOSE 149*  --  58* 102* 234* 283*  BUN 32*  --  34* 34* 39* 53*  CREATININE 2.02*  --  2.05* 1.83* 1.82* 1.90*  CALCIUM 9.0  --  8.5* 8.9 8.5* 8.9  MG  --  2.1  --  2.0 2.0 2.3  PHOS  --   --   --  3.6 3.0 3.9   GFR: Estimated Creatinine  Clearance: 18.3 mL/min (A) (by C-G formula based on SCr of 1.9 mg/dL (H)). Liver Function Tests: Recent Labs  Lab 08/31/2021 1250 09/04/21 0515 09/05/21 0728 09/06/21 0043 09/07/21 0431  AST 52* 124* 93* 53* 43*   ALT 32 60* 68* 53* 47*  ALKPHOS 102 108 127* 125 141*  BILITOT 0.9 0.9 1.1 1.0 0.8  PROT 6.6 6.2* 6.3* 5.9* 6.2*  ALBUMIN 3.3* 2.6* 2.5* 2.3* 2.3*   No results for input(s): "LIPASE", "AMYLASE" in the last 168 hours. No results for input(s): "AMMONIA" in the last 168 hours. Coagulation Profile: Recent Labs  Lab 08/26/2021 1250  INR 3.5*   Cardiac Enzymes: No results for input(s): "CKTOTAL", "CKMB", "CKMBINDEX", "TROPONINI" in the last 168 hours. BNP (last 3 results) No results for input(s): "PROBNP" in the last 8760 hours. HbA1C: No results for input(s): "HGBA1C" in the last 72 hours. CBG: Recent Labs  Lab 09/06/21 1211 09/06/21 1704 09/06/21 2152 09/07/21 0752 09/07/21 1106  GLUCAP 286* 271* 239* 283* 297*   Lipid Profile: No results for input(s): "CHOL", "HDL", "LDLCALC", "TRIG", "CHOLHDL", "LDLDIRECT" in the last 72 hours. Thyroid Function Tests: No results for input(s): "TSH", "T4TOTAL", "FREET4", "T3FREE", "THYROIDAB" in the last 72 hours. Anemia Panel: No results for input(s): "VITAMINB12", "FOLATE", "FERRITIN", "TIBC", "IRON", "RETICCTPCT" in the last 72 hours. Sepsis Labs: Recent Labs  Lab 08/13/2021 1435 08/16/2021 1636 08/24/2021 1930  LATICACIDVEN 1.4 2.3* 2.2*    Recent Results (from the past 240 hour(s))  Culture, blood (routine x 2)     Status: Abnormal   Collection Time: 08/28/2021  3:00 PM   Specimen: BLOOD LEFT ARM  Result Value Ref Range Status   Specimen Description   Final    BLOOD LEFT ARM Performed at [facility], [address] [place], [place] [address]    Special Requests   Final    BOTTLES DRAWN AEROBIC AND ANAEROBIC Blood Culture adequate volume Performed at [facility], [address] [place], [place] [address]    Culture  Setup Time   Final    GRAM NEGATIVE RODS ANAEROBIC BOTTLE ONLY CRITICAL RESULT CALLED TO, READ BACK BY AND VERIFIED WITH: C/ PHARMD MJeneen Rinks 09/04/21 0628 A.  LAFRANCE Performed at [facility], [place] [address] [place], [place] [address]    Culture ESCHERICHIA COLI (A)  Final   Report Status 09/06/2021 FINAL  Final   Organism ID, Bacteria ESCHERICHIA COLI  Final      Susceptibility   Escherichia coli - MIC*    AMPICILLIN >=32 RESISTANT Resistant     CEFAZOLIN >=64 RESISTANT Resistant     CEFEPIME <=0.12 SENSITIVE Sensitive     CEFTAZIDIME 4 SENSITIVE Sensitive     CEFTRIAXONE 0.5 SENSITIVE Sensitive     CIPROFLOXACIN <=0.25 SENSITIVE Sensitive     GENTAMICIN <=1 SENSITIVE Sensitive     IMIPENEM <=0.25 SENSITIVE Sensitive     TRIMETH/SULFA <=20 SENSITIVE Sensitive     AMPICILLIN/SULBACTAM >=32 RESISTANT Resistant     PIP/TAZO <=4 SENSITIVE Sensitive     * ESCHERICHIA COLI  Blood Culture ID Panel (Reflexed)     Status: Abnormal   Collection Time: 08/11/2021  3:00 PM  Result Value Ref Range Status   Enterococcus faecalis NOT DETECTED NOT DETECTED Final   Enterococcus Faecium NOT DETECTED NOT DETECTED Final   Listeria monocytogenes NOT DETECTED NOT DETECTED Final   Staphylococcus species NOT DETECTED NOT DETECTED Final   Staphylococcus aureus (BCID) NOT DETECTED NOT DETECTED Final   Staphylococcus epidermidis  NOT DETECTED NOT DETECTED Final   Staphylococcus lugdunensis NOT DETECTED NOT DETECTED Final   Streptococcus species NOT DETECTED NOT DETECTED Final   Streptococcus agalactiae NOT DETECTED NOT DETECTED Final   Streptococcus pneumoniae NOT DETECTED NOT DETECTED Final   Streptococcus pyogenes NOT DETECTED NOT DETECTED Final   A.calcoaceticus-baumannii NOT DETECTED NOT DETECTED Final   Bacteroides fragilis NOT DETECTED NOT DETECTED Final   Enterobacterales DETECTED (A) NOT DETECTED Final    Comment: Enterobacterales represent a large order of gram negative bacteria, not a single organism. CRITICAL RESULT CALLED TO, READ BACK BY AND VERIFIED WITH: C/ PHARMD M. JAMES 09/04/21 0628 A. LAFRANCE    Enterobacter cloacae complex NOT  DETECTED NOT DETECTED Final   Escherichia coli DETECTED (A) NOT DETECTED Final    Comment: CRITICAL RESULT CALLED TO, READ BACK BY AND VERIFIED WITH: C/ PHARMD M. JAMES 09/04/21 0628 A. LAFRANCE    Klebsiella aerogenes NOT DETECTED NOT DETECTED Final   Klebsiella oxytoca NOT DETECTED NOT DETECTED Final   Klebsiella pneumoniae NOT DETECTED NOT DETECTED Final   Proteus species NOT DETECTED NOT DETECTED Final   Salmonella species NOT DETECTED NOT DETECTED Final   Serratia marcescens NOT DETECTED NOT DETECTED Final   Haemophilus influenzae NOT DETECTED NOT DETECTED Final   Neisseria meningitidis NOT DETECTED NOT DETECTED Final   Pseudomonas aeruginosa NOT DETECTED NOT DETECTED Final   Stenotrophomonas maltophilia NOT DETECTED NOT DETECTED Final   Candida albicans NOT DETECTED NOT DETECTED Final   Candida auris NOT DETECTED NOT DETECTED Final   Candida glabrata NOT DETECTED NOT DETECTED Final   Candida krusei NOT DETECTED NOT DETECTED Final   Candida parapsilosis NOT DETECTED NOT DETECTED Final   Candida tropicalis NOT DETECTED NOT DETECTED Final   Cryptococcus neoformans/gattii NOT DETECTED NOT DETECTED Final   CTX-M ESBL NOT DETECTED NOT DETECTED Final   Carbapenem resistance IMP NOT DETECTED NOT DETECTED Final   Carbapenem resistance KPC NOT DETECTED NOT DETECTED Final   Carbapenem resistance NDM NOT DETECTED NOT DETECTED Final   Carbapenem resist OXA 48 LIKE NOT DETECTED NOT DETECTED Final   Carbapenem resistance VIM NOT DETECTED NOT DETECTED Final    Comment: Performed at [facility], [address], [place], [place] [address]  Culture, blood (routine x 2)     Status: None (Preliminary result)   Collection Time: 08/30/2021  3:24 PM   Specimen: BLOOD RIGHT WRIST  Result Value Ref Range Status   Specimen Description   Final    BLOOD RIGHT WRIST Performed at [facility], [address] [place], [place] [address]    Special Requests   Final    BOTTLES  DRAWN AEROBIC AND ANAEROBIC Blood Culture adequate volume Performed at [facility], [address] [place], [place] [address]    Culture   Final    NO GROWTH 4 DAYS Performed at [facility], [place] [address], [place], [place] [address]    Report Status PENDING  Incomplete  Resp Panel by RT-PCR (Flu A&B, Covid) Urine, Clean Catch     Status: None   Collection Time: 08/29/2021  4:36 PM   Specimen: Urine, Clean Catch; Nasal Swab  Result Value Ref Range Status   SARS Coronavirus 2 by RT PCR NEGATIVE NEGATIVE Final    Comment: (NOTE) SARS-CoV-2 target nucleic acids are NOT DETECTED.  The SARS-CoV-2 RNA is generally detectable in upper respiratory specimens during the acute phase of infection. The lowest concentration of SARS-CoV-2 viral copies this assay  can detect is 138 copies/mL. A negative result does not preclude SARS-Cov-2 infection and should not be used as the sole basis for treatment or other patient management decisions. A negative result may occur with  improper specimen collection/handling, submission of specimen other than nasopharyngeal swab, presence of viral mutation(s) within the areas targeted by this assay, and inadequate number of viral copies(<138 copies/mL). A negative result must be combined with clinical observations, patient history, and epidemiological information. The expected result is Negative.  Fact Sheet for Patients:  EntrepreneurPulse.com.au  Fact Sheet for Healthcare Providers:  IncredibleEmployment.be  This test is no t yet approved or cleared by the [place] FDA and  has been authorized for detection and/or diagnosis of SARS-CoV-2 by FDA under an Emergency Use Authorization (EUA). This EUA will remain  in effect (meaning this test can be used) for the duration of the COVID-19 declaration under Section 564(b)(1) of the Act, 21 U.S.C.section 360bbb-3(b)(1), unless the authorization is  terminated  or revoked sooner.       Influenza A by PCR NEGATIVE NEGATIVE Final   Influenza B by PCR NEGATIVE NEGATIVE Final    Comment: (NOTE) The Xpert Xpress SARS-CoV-2/FLU/RSV plus assay is intended as an aid in the diagnosis of influenza from Nasopharyngeal swab specimens and should not be used as a sole basis for treatment. Nasal washings and aspirates are unacceptable for Xpert Xpress SARS-CoV-2/FLU/RSV testing.  Fact Sheet for Patients: EntrepreneurPulse.com.au  Fact Sheet for Healthcare Providers: IncredibleEmployment.be  This test is not yet approved or cleared by the [place] FDA and has been authorized for detection and/or diagnosis of SARS-CoV-2 by FDA under an Emergency Use Authorization (EUA). This EUA will remain in effect (meaning this test can be used) for the duration of the COVID-19 declaration under Section 564(b)(1) of the Act, 21 U.S.C. section 360bbb-3(b)(1), unless the authorization is terminated or revoked.  Performed at [facility], [address], [place], [place] [address]   Culture, blood (Routine X 2) w Reflex to ID Panel     Status: None (Preliminary result)   Collection Time: 09/06/21 12:43 AM   Specimen: BLOOD  Result Value Ref Range Status   Specimen Description   Final    BLOOD BLOOD RIGHT ARM Performed at [facility] [address] [place], [place] [address]    Special Requests   Final    BOTTLES DRAWN AEROBIC ONLY Blood Culture adequate volume Performed at [facility] [address], [place], [place] [address]    Culture   Final    NO GROWTH 1 DAY Performed at [facility], [place] [address], [place], [place] [address]    Report Status PENDING  Incomplete  Culture, blood (Routine X 2) w Reflex to ID Panel     Status: None (Preliminary result)   Collection Time: 09/06/21 12:48 AM   Specimen: BLOOD  Result Value Ref Range  Status   Specimen Description   Final    BLOOD RIGHT ANTECUBITAL Performed at [place] [address] [place], [place] [address]    Special Requests   Final    BOTTLES DRAWN AEROBIC ONLY Blood Culture adequate volume Performed at [facility] [address] [place], [place] [address]    Culture   Final    NO GROWTH 1 DAY Performed at [facility], [place] [address], [place], [place] [address]    Report Status PENDING  Incomplete    Radiology Studies:  DG CHEST PORT 1 VIEW  Result Date: 09/07/2021 CLINICAL DATA:  Shortness of breath EXAM: PORTABLE CHEST - 1 VIEW COMPARISON:  08/29/2021 FINDINGS: Median sternotomy changes and implanted cardiac device again seen. Heart size is within normal limits. No pulmonary vascular congestion. Diffuse bilateral airspace opacities are unchanged from prior exam. IMPRESSION: Unchanged diffuse bilateral airspace opacities, most consistent with multifocal pneumonia. Electronically Signed   By: Miachel Roux M.D.   On: 09/07/2021 08:24   DG CHEST PORT 1 VIEW  Result Date: 09/06/2021 CLINICAL DATA:  Follow-up pneumonia. EXAM: PORTABLE CHEST 1 VIEW COMPARISON:  September 05, 2021 FINDINGS: Multiple sternal wires and vascular clips are seen. The heart size and mediastinal contours are within normal limits. A radiopaque loop recorder device is seen. There is moderate severity calcification of the aortic arch. Stable, moderate severity bilateral multifocal infiltrates are seen. There is no evidence of a pleural effusion or pneumothorax. The visualized skeletal structures are unremarkable. IMPRESSION: 1. Evidence of prior median sternotomy/CABG. 2. Stable, moderate severity bilateral multifocal infiltrates. Electronically Signed   By: Virgina Norfolk M.D.   On: 09/06/2021 02:56     Scheduled Meds:  amiodarone  200 mg Oral Daily   arformoterol  15 mcg Nebulization BID   budesonide (PULMICORT) nebulizer solution  0.25 mg  Nebulization BID   clopidogrel  75 mg Oral Daily   memantine  28 mg Oral QHS   And   donepezil  10 mg Oral QHS   DULoxetine  60 mg Oral Daily   ezetimibe  10 mg Oral Daily   gabapentin  300 mg Oral BID   insulin aspart  0-15 Units Subcutaneous TID WC   insulin glargine-yfgn  15 Units Subcutaneous Daily   ipratropium  0.5 mg Nebulization BID   isosorbide dinitrate  30 mg Oral BID   levalbuterol  0.63 mg Nebulization BID   methylPREDNISolone (SOLU-MEDROL) injection  40 mg Intravenous BID   metoprolol succinate  12.5 mg Oral Daily   pantoprazole  40 mg Oral Daily   rosuvastatin  10 mg Oral QAC supper   sodium chloride flush  3 mL Intravenous Q12H   Continuous Infusions:  cefTRIAXone (ROCEPHIN)  IV 2 g (09/07/21 1405)    LOS: 4 days   Raiford Noble, DO [facility] Available via Epic secure chat 7am-7pm After these hours, please refer to coverage provider listed on [email or web address] 09/07/2021, 4:40 PM

## 2021-09-07 NOTE — TOC Progression Note (Signed)
Transition of Care [facility]) - Progression Note    Patient Details  Name: Linda Oliver MRN: [id number] Date of Birth: 11/22/1932  Transition of Care [facility]) CM/SW Cesar Chavez, RN Phone Number: 09/07/2021, 10:45 AM  Clinical Narrative:   Spoke with patient's son in law Linda Oliver [phone] who reports needing additional help with bathing. Notified MD of HHPT and HHA orders needed, awaiting orders.John also reports they purchased a 4 ft bedside rail for patient at home.   TOC will continue to follow.    Expected Discharge Plan: [place] Barriers to Discharge: Continued Medical Work up  Expected Discharge Plan and Services Expected Discharge Plan: [place] In-house Referral: NA Discharge Planning Services: CM Consult Post Acute Care Choice: [place] arrangements for the past 2 months: Single Family Home                 DME Arranged: N/A         HH Arranged: Nurse's Aide HH Agency: [place] Date [place]: 09/06/21 Time [place]: 1631 Representative spoke with at [place]: [place] Determinants of Health ([place]) Interventions    Readmission Risk Interventions     No data to display

## 2021-09-07 NOTE — Progress Notes (Signed)
Physical Therapy Treatment Patient Details Name: Linda Oliver MRN: [id number] DOB: 1932-01-19 Today's Date: 09/07/2021   History of Present Illness 88 y.o. female with medical history significant of diabetes, neuropathy, GERD, obesity, carotid artery disease, CAD status post CABG, CVA, CHF, hypertension, atrial fibrillation, OSA on CPAP, mild cognitive impairment, anemia presenting with altered mental status. The work-up reveals that she has an E. coli bacteremia and will obtain a CT of the chest without contrast to further evaluate for her pneumonia.    PT Comments    Pt assisted with ambulating in hallway and then went into bathroom to use toilet today.  Pt pleased with progression of mobility.  Pt remained on 2L O2 [place], and pt and son in law reports this is her baseline at home.    Recommendations for follow up therapy are one component of a multi-disciplinary discharge planning process, led by the attending physician.  Recommendations may be updated based on patient status, additional functional criteria and insurance authorization.  Follow Up Recommendations  Home health PT     Assistance Recommended at Discharge Frequent or constant Supervision/Assistance  Patient can return home with the following A little help with walking and/or transfers;A little help with bathing/dressing/bathroom;Assistance with cooking/housework;Assist for transportation;Help with stairs or ramp for entrance   Equipment Recommendations  None recommended by PT    Recommendations for Other Services       Precautions / Restrictions Precautions Precautions: Fall Precaution Comments: reports 2L O2 [place] baseline Restrictions Weight Bearing Restrictions: No     Mobility  Bed Mobility               General bed mobility comments: pt in recliner    Transfers Overall transfer level: Needs assistance Equipment used: Rolling walker (2 wheels) Transfers: Sit to/from Stand Sit to Stand: Min guard, Min  assist           General transfer comment: increased time and effort, min/guard from recliner, light assist from toilet however improved with cue to use grab bar    Ambulation/Gait Ambulation/Gait assistance: Min guard Gait Distance (Feet): 50 Feet Assistive device: Rolling walker (2 wheels) Gait Pattern/deviations: Step-through pattern, Decreased stride length       General Gait Details: verbal cues for RW positioning, distance to pt tolerance; SpO2 92% on 2L O2 [place] and then monitor fell off and unable to replace and obtain good reading end of session; pt mildly SOB after ambulating and with increased work of breathing however improved with a couple minutes rest   Stairs             Wheelchair Mobility    Modified Rankin (Stroke Patients Only)       Balance                                            Cognition Arousal/Alertness: Awake/alert Behavior During Therapy: WFL for tasks assessed/performed Overall Cognitive Status: History of cognitive impairments - at baseline                                          Exercises      General Comments        Pertinent Vitals/Pain Pain Assessment Pain Assessment: No/denies pain    Home Living  Prior Function            PT Goals (current goals can now be found in the care plan section) Progress towards PT goals: Progressing toward goals    Frequency    Min 3X/week      PT Plan Current plan remains appropriate    Co-evaluation              AM-PAC PT "6 Clicks" Mobility   Outcome Measure  Help needed turning from your back to your side while in a flat bed without using bedrails?: A Little Help needed moving from lying on your back to sitting on the side of a flat bed without using bedrails?: A Little Help needed moving to and from a bed to a chair (including a wheelchair)?: A Little Help needed standing up from a chair using  your arms (e.g., wheelchair or bedside chair)?: A Little Help needed to walk in hospital room?: A Little Help needed climbing 3-5 steps with a railing? : A Lot 6 Click Score: 17    End of Session Equipment Utilized During Treatment: Oxygen Activity Tolerance: Patient tolerated treatment well Patient left: with call bell/phone within reach;in chair;with chair alarm set;with family/visitor present   PT Visit Diagnosis: Muscle weakness (generalized) (M62.81);Difficulty in walking, not elsewhere classified (R26.2)     Time: 8381-8403 PT Time Calculation (min) (ACUTE ONLY): 17 min  Charges:  $Gait Training: 8-22 mins                    Arlyce Dice, DPT Physical Therapist Acute Rehabilitation Services Preferred contact method: Secure Chat Weekend Pager Only: [phone] Office: [place] 09/07/2021, 11:28 AM

## 2021-09-07 NOTE — Progress Notes (Signed)
OT Cancellation Note  Patient Details Name: LENIYA BREIT MRN: [id number] DOB: May 10, 1932   Cancelled Treatment:    Reason Eval/Treat Not Completed: Patient declined, no reason specified Patient reported fatigue from PT session earlier on this date. Patient asked for breathing treatment nurse made aware. OT to continue to follow and check back as schedule will allow.  Rennie Plowman, [place] Acute Rehabilitation Department Office# [phone]  09/07/2021, 4:34 PM

## 2021-09-07 NOTE — Inpatient Diabetes Management (Signed)
Inpatient Diabetes Program Recommendations  AACE/ADA: New Consensus Statement on Inpatient Glycemic Control (2015)  Target Ranges:  Prepandial:   less than 140 mg/dL      Peak postprandial:   less than 180 mg/dL (1-2 hours)      Critically ill patients:  140 - 180 mg/dL   Lab Results  Component Value Date   GLUCAP 239 (H) 09/06/2021   HGBA1C 7.8 (H) 07/07/2021    Review of Glycemic Control  Diabetes history: DM2 Outpatient Diabetes medications: Amaryl 1 mg QAM, Lantus 20-40 units QD before breakfast, Januvia 100 mg QAM Current orders for Inpatient glycemic control: Semglee 12 QD, Novolog 0-15 TID On Solumedrol 40 mg BID  Blood sugars elevated with steroids on board. Received 21 units of Novolog correction yesterday. May benefit from meal coverage insulin while on steroids  Inpatient Diabetes Program Recommendations:    Consider adding Novolog 2-3 units TID with meals if eating > 50%.  Increase Semglee to 15 units QD  Continue to follow glucose trends.  Thank you. Lorenda Peck, RD, LDN, [place] Inpatient Diabetes Coordinator [phone]

## 2021-09-07 NOTE — Progress Notes (Signed)
Report received from ongoing nurse. Agreed with nurse assessment of patient and will cont to monitor.

## 2021-09-07 NOTE — Progress Notes (Signed)
Mobility Specialist Cancellation/Refusal Note:    09/07/21 1532  Mobility  Activity Refused mobility     Reason for Cancellation/Refusal: Pt declined mobility at this time. Pt "exhausted". Will check back as schedule permits.      [facility]

## 2021-09-08 ENCOUNTER — Inpatient Hospital Stay (HOSPITAL_COMMUNITY): Payer: Medicare Other

## 2021-09-08 DIAGNOSIS — R7881 Bacteremia: Secondary | ICD-10-CM

## 2021-09-08 DIAGNOSIS — E041 Nontoxic single thyroid nodule: Secondary | ICD-10-CM

## 2021-09-08 DIAGNOSIS — A419 Sepsis, unspecified organism: Secondary | ICD-10-CM

## 2021-09-08 DIAGNOSIS — J189 Pneumonia, unspecified organism: Secondary | ICD-10-CM | POA: Diagnosis not present

## 2021-09-08 DIAGNOSIS — B9620 Unspecified Escherichia coli [E. coli] as the cause of diseases classified elsewhere: Secondary | ICD-10-CM

## 2021-09-08 DIAGNOSIS — R6520 Severe sepsis without septic shock: Secondary | ICD-10-CM

## 2021-09-08 LAB — CBC WITH DIFFERENTIAL/PLATELET
Abs Immature Granulocytes: 0.2 10*3/uL — ABNORMAL HIGH (ref 0.00–0.07)
Basophils Absolute: 0 10*3/uL (ref 0.0–0.1)
Basophils Relative: 0 %
Eosinophils Absolute: 0 10*3/uL (ref 0.0–0.5)
Eosinophils Relative: 0 %
HCT: 28 % — ABNORMAL LOW (ref 36.0–46.0)
Hemoglobin: 8.2 g/dL — ABNORMAL LOW (ref 12.0–15.0)
Immature Granulocytes: 1 %
Lymphocytes Relative: 4 %
Lymphs Abs: 0.7 10*3/uL (ref 0.7–4.0)
MCH: 25.2 pg — ABNORMAL LOW (ref 26.0–34.0)
MCHC: 29.3 g/dL — ABNORMAL LOW (ref 30.0–36.0)
MCV: 86.2 fL (ref 80.0–100.0)
Monocytes Absolute: 1.4 10*3/uL — ABNORMAL HIGH (ref 0.1–1.0)
Monocytes Relative: 7 %
Neutro Abs: 16.7 10*3/uL — ABNORMAL HIGH (ref 1.7–7.7)
Neutrophils Relative %: 88 %
Platelets: 366 10*3/uL (ref 150–400)
RBC: 3.25 MIL/uL — ABNORMAL LOW (ref 3.87–5.11)
RDW: 17.1 % — ABNORMAL HIGH (ref 11.5–15.5)
WBC: 19.1 10*3/uL — ABNORMAL HIGH (ref 4.0–10.5)
nRBC: 0.5 % — ABNORMAL HIGH (ref 0.0–0.2)

## 2021-09-08 LAB — CULTURE, BLOOD (ROUTINE X 2)
Culture: NO GROWTH
Special Requests: ADEQUATE

## 2021-09-08 LAB — COMPREHENSIVE METABOLIC PANEL
ALT: 47 U/L — ABNORMAL HIGH (ref 0–44)
AST: 40 U/L (ref 15–41)
Albumin: 2.3 g/dL — ABNORMAL LOW (ref 3.5–5.0)
Alkaline Phosphatase: 146 U/L — ABNORMAL HIGH (ref 38–126)
Anion gap: 14 (ref 5–15)
BUN: 58 mg/dL — ABNORMAL HIGH (ref 8–23)
CO2: 27 mmol/L (ref 22–32)
Calcium: 9.2 mg/dL (ref 8.9–10.3)
Chloride: 99 mmol/L (ref 98–111)
Creatinine, Ser: 1.99 mg/dL — ABNORMAL HIGH (ref 0.44–1.00)
GFR, Estimated: 24 mL/min — ABNORMAL LOW (ref >60)
Glucose, Bld: 255 mg/dL — ABNORMAL HIGH (ref 70–99)
Potassium: 3.8 mmol/L (ref 3.5–5.1)
Sodium: 140 mmol/L (ref 135–145)
Total Bilirubin: 0.9 mg/dL (ref 0.3–1.2)
Total Protein: 6.4 g/dL — ABNORMAL LOW (ref 6.5–8.1)

## 2021-09-08 LAB — PHOSPHORUS: Phosphorus: 3.8 mg/dL (ref 2.5–4.6)

## 2021-09-08 LAB — GLUCOSE, CAPILLARY
Glucose-Capillary: 233 mg/dL — ABNORMAL HIGH (ref 70–99)
Glucose-Capillary: 269 mg/dL — ABNORMAL HIGH (ref 70–99)
Glucose-Capillary: 171 mg/dL — ABNORMAL HIGH (ref 70–99)
Glucose-Capillary: 212 mg/dL — ABNORMAL HIGH (ref 70–99)

## 2021-09-08 LAB — MAGNESIUM: Magnesium: 2.2 mg/dL (ref 1.7–2.4)

## 2021-09-08 MED ORDER — APIXABAN 5 MG PO TABS
5.0000 mg | ORAL_TABLET | Freq: Two times a day (BID) | ORAL | Status: DC
  Administered 2021-09-08 – 2021-09-10 (×4): 5 mg via ORAL
  Filled 2021-09-08 (×4): qty 1

## 2021-09-08 NOTE — Progress Notes (Addendum)
Physical Therapy Treatment Patient Details Name: Linda Oliver MRN: [id number] DOB: 23-Oct-1932 Today's Date: 09/08/2021   History of Present Illness 88 y.o. female with medical history significant of diabetes, neuropathy, GERD, obesity, carotid artery disease, CAD status post CABG, CVA, CHF, hypertension, atrial fibrillation, OSA on CPAP, mild cognitive impairment, anemia presenting with altered mental status. The work-up reveals that she has an E. coli bacteremia and will obtain a CT of the chest without contrast to further evaluate for her pneumonia.    PT Comments    Pt demonstrates decr activity tol, requiring incr assist overall this session. More confused per family than her baseline. Pt may need SNF unless family able to provide mod A/24 hour supervision/assist  continue PT POC   Recommendations for follow up therapy are one component of a multi-disciplinary discharge planning process, led by the attending physician.  Recommendations may be updated based on patient status, additional functional criteria and insurance authorization.  Follow Up Recommendations  Skilled nursing-short term rehab (<3 hours/day) Can patient physically be transported by private vehicle: No   Assistance Recommended at Discharge Frequent or constant Supervision/Assistance  Patient can return home with the following A lot of help with walking and/or transfers;A lot of help with bathing/dressing/bathroom;Assistance with cooking/housework;Assistance with feeding;Direct supervision/assist for financial management;Direct supervision/assist for medications management;Assist for transportation;Help with stairs or ramp for entrance   Equipment Recommendations  None recommended by PT    Recommendations for Other Services       Precautions / Restrictions Precautions Precautions: Fall Precaution Comments: reports 2L O2 [place] baseline Restrictions Weight Bearing Restrictions: No     Mobility  Bed Mobility Overal  bed mobility: Needs Assistance Bed Mobility: Supine to Sit, Sit to Supine     Supine to sit: Min assist Sit to supine: Min assist, Mod assist   General bed mobility comments: assist to elevate trunk, assist to lift bil LEs on to bed    Transfers Overall transfer level: Needs assistance Equipment used: Rolling walker (2 wheels) Transfers: Sit to/from Stand Sit to Stand: Mod assist           General transfer comment: increased time and effort, mod assist to power up, 2 attempts to stand, pt immedicately returned to sitting without warning on iniital stand. steps by step cues with improvement on second trial. 3/4 DOES with VSS    Ambulation/Gait               General Gait Details: unable d/t dyspnea, fatigue   Stairs             Wheelchair Mobility    Modified Rankin (Stroke Patients Only)       Balance   Sitting-balance support: Single extremity supported, Feet supported Sitting balance-Leahy Scale: Fair       Standing balance-Leahy Scale: Poor                              Cognition Arousal/Alertness: Awake/alert Behavior During Therapy: Restless Overall Cognitive Status: History of cognitive impairments - at baseline                                          Exercises General Exercises - Lower Extremity Long Arc Quad: AROM, Both, 10 reps Hip ABduction/ADduction: AROM, Both, 10 reps    General Comments  Pertinent Vitals/Pain Pain Assessment Pain Assessment: No/denies pain    Home Living                          Prior Function            PT Goals (current goals can now be found in the care plan section) Acute Rehab PT Goals Patient Stated Goal: none stated. no family present PT Goal Formulation: Patient unable to participate in goal setting Time For Goal Achievement: 09/19/21 Potential to Achieve Goals: Fair Progress towards PT goals: Progressing toward goals (slowly)     Frequency    Min 3X/week      PT Plan Current plan remains appropriate;Discharge plan needs to be updated    Co-evaluation              AM-PAC PT "6 Clicks" Mobility   Outcome Measure  Help needed turning from your back to your side while in a flat bed without using bedrails?: A Lot Help needed moving from lying on your back to sitting on the side of a flat bed without using bedrails?: A Lot Help needed moving to and from a bed to a chair (including a wheelchair)?: A Lot Help needed standing up from a chair using your arms (e.g., wheelchair or bedside chair)?: A Lot Help needed to walk in hospital room?: Total Help needed climbing 3-5 steps with a railing? : Total 6 Click Score: 10    End of Session Equipment Utilized During Treatment: Oxygen Activity Tolerance: Patient limited by fatigue Patient left: in bed;with call bell/phone within reach;with bed alarm set;with family/visitor present   PT Visit Diagnosis: Muscle weakness (generalized) (M62.81);Difficulty in walking, not elsewhere classified (R26.2)     Time: 3419-3790    Charges:  $Therapeutic Activity:                      Baxter Flattery, PT  Acute Rehab Dept [facility]) [phone]  [facility] Weekend Pager [facility] only)  [phone]  09/08/2021    [facility] 09/08/2021, 4:26 PM

## 2021-09-08 NOTE — Progress Notes (Signed)
Occupational Therapy Treatment Patient Details Name: Linda Oliver MRN: [id number] DOB: Nov 23, 1932 Today's Date: 09/08/2021   History of present illness 88 y.o. female with medical history significant of diabetes, neuropathy, GERD, obesity, carotid artery disease, CAD status post CABG, CVA, CHF, hypertension, atrial fibrillation, OSA on CPAP, mild cognitive impairment, anemia presenting with altered mental status. The work-up reveals that she has an E. coli bacteremia and will obtain a CT of the chest without contrast to further evaluate for her pneumonia.   OT comments  Patient able to transfer to [facility] and perform toileting without walker and with min guard assist. Patient on 3 L [place]. After transfer and toileting patient seated in recliner. Left ear probe initially said 98% and then lost signal. On o2 sat device patient's finger reading 79-80% on 3 L with patient exhibiting increased WOB. Oxygen increased to 4 L [place] and o2 sat up to 83%. Other hand checked and continued to show 83%. Therapist placed new ear probe on right ear and o2 sat 87%. Tool approx 5 minutes to recover back in to 90s with 4 L oxygen. Patient moving well but with poor activity tolerance. Reduced oxygen back to 3 L [place] and RN notified.    Recommendations for follow up therapy are one component of a multi-disciplinary discharge planning process, led by the attending physician.  Recommendations may be updated based on patient status, additional functional criteria and insurance authorization.    Follow Up Recommendations  Home health OT    Assistance Recommended at Discharge Frequent or constant Supervision/Assistance  Patient can return home with the following  A little help with walking and/or transfers;A lot of help with bathing/dressing/bathroom;Assistance with cooking/housework;Direct supervision/assist for financial management;Direct supervision/assist for medications management;Help with stairs or ramp for entrance   Equipment  Recommendations  None recommended by OT    Recommendations for Other Services      Precautions / Restrictions Precautions Precautions: Fall Precaution Comments: reports 2L O2 [place] baseline Restrictions Weight Bearing Restrictions: No       Mobility Bed Mobility                    Transfers                         Balance Overall balance assessment: Needs assistance Sitting-balance support: No upper extremity supported, Feet supported Sitting balance-Leahy Scale: Good     Standing balance support: Reliant on assistive device for balance Standing balance-Leahy Scale: Poor                             ADL either performed or assessed with clinical judgement   ADL Overall ADL's : Needs assistance/impaired                         Toilet Transfer: Min Dispensing optician Details (indicate cue type and reason): Patient reports she doesn't have to use the walker to get to [place] a couple of steps away from bed. Toileting- Clothing Manipulation and Hygiene: Supervision/safety;Sit to/from stand Toileting - Clothing Manipulation Details (indicate cue type and reason): Patient able to perform toileting     Functional mobility during ADLs: Min guard General ADL Comments: Able to transfer to [facility] and then recliner with hand holds but not using a walker.    Extremity/Trunk Assessment Upper Extremity Assessment Upper Extremity Assessment: Overall WFL for tasks assessed  Lower Extremity Assessment Lower Extremity Assessment: Defer to PT evaluation   Cervical / Trunk Assessment Cervical / Trunk Assessment: Kyphotic    Vision   Vision Assessment?: No apparent visual deficits   Perception     Praxis      Cognition Arousal/Alertness: Awake/alert Behavior During Therapy: WFL for tasks assessed/performed Overall Cognitive Status: History of cognitive impairments - at baseline                                  General Comments: mild memory deficits        Exercises      Shoulder Instructions       General Comments      Pertinent Vitals/ Pain       Pain Assessment Pain Assessment: No/denies pain  Home Living                                          Prior Functioning/Environment              Frequency  Min 2X/week        Progress Toward Goals  OT Goals(current goals can now be found in the care plan section)  Progress towards OT goals: Progressing toward goals  Acute Rehab OT Goals Patient Stated Goal: walk to bathroom OT Goal Formulation: With family Time For Goal Achievement: 09/19/21 Potential to Achieve Goals: Good  Plan Discharge plan remains appropriate    Co-evaluation                 AM-PAC OT "6 Clicks" Daily Activity     Outcome Measure   Help from another person eating meals?: A Little Help from another person taking care of personal grooming?: A Little Help from another person toileting, which includes using toliet, bedpan, or urinal?: A Little Help from another person bathing (including washing, rinsing, drying)?: A Lot Help from another person to put on and taking off regular upper body clothing?: A Little Help from another person to put on and taking off regular lower body clothing?: A Lot 6 Click Score: 16    End of Session Equipment Utilized During Treatment: Rolling walker (2 wheels);Oxygen  OT Visit Diagnosis: Muscle weakness (generalized) (M62.81);Other symptoms and signs involving cognitive function   Activity Tolerance Patient tolerated treatment well   Patient Left in chair;with call bell/phone within reach;with family/visitor present;with nursing/sitter in room   Nurse Communication Mobility status (oxygen saturations, poor activity tolerance)        Time: 1040-1051 OT Time Calculation (min): 11 min  Charges: OT General Charges $OT Visit: 1 Visit OT Treatments $Self Care/Home Management : 8-22  mins  Derl Barrow, OTR/L [place]  Office [phone] Pager: [place] 09/08/2021, 12:45 PM

## 2021-09-08 NOTE — Progress Notes (Signed)
[facility] Progress Note  Patient: Linda Oliver     BZJ:[id number]  DOA: 08/18/2021   PCP: Shon Baton, MD       Brief hospital course: This is an 88 year old female with CKD stage IIIb, atrial fibrillation, coronary artery disease status post stenting, diabetes mellitus type 2, OSA, obesity who presented to the hospital for hallucinations and weakness.  Her PCP had felt she may have a UTI and she was started on cephalexin the day before coming to the hospital. She was initially admitted for multifocal pneumonia and started on ceftriaxone and azithromycin.  She was later found to have E. coli bacteremia.  Subjective:  Continues to have cough, weakness and some shortness of breath.  Assessment and Plan: Principal Problem:   Multifocal pneumonia, E. coli bacteremia with resultant acute encephalopathy - Continue ceftriaxone and azithromycin -Chest x-ray repeated today reveals diffuse bilateral airspace opacities which are unchanged from prior imaging - Wean oxygen as able -She is receiving Solu-Medrol and this is being weaned   Active Problems: Acute on chronic systolic heart failure - Has received IV Lasix - Hold off on further IV Lasix  Hypokalemia - Has been replaced  Atrial fibrillation - Continue amiodarone, metoprolol and Eliquis  Diabetes mellitus type 2 - Continue Semglee and sliding scale insulin  Dementia - Continue Aricept and Namenda      DVT prophylaxis:  SCDs Start: 08/16/2021 2332 apixaban (ELIQUIS) tablet 5 mg    Code Status: Full Code  Consultants: None Level of Care: Level of care: Telemetry Disposition Plan:  Status is: Inpatient Remains inpatient appropriate because: Ongoing treatment of pneumonia with hypoxia   Objective:   Vitals:   09/07/21 1637 09/07/21 2029 09/07/21 2219 09/08/21 0423  BP:  (!) 152/62  (!) 153/42  Pulse:  89  90  Resp:  (!) 21  20  Temp:  97.6 F (36.4 C)  (!) 97.5 F (36.4 C)  TempSrc:    Axillary  SpO2:  95% (!) 88% 91% 95%  Weight:      Height:       Filed Weights   08/25/2021 1241 09/04/21 0500 09/05/21 0500  Weight: 71.6 kg 71.7 kg 68.1 kg   Exam: General exam: Appears comfortable  HEENT: PERRLA, oral mucosa moist, no sclera icterus or thrush Respiratory system: Bilateral crackles-respiratory effort normal. Cardiovascular system: S1 & S2 heard, regular rate and rhythm Gastrointestinal system: Abdomen soft, non-tender, nondistended. Normal bowel sounds   Central nervous system: Alert and oriented. No focal neurological deficits. Extremities: No cyanosis, clubbing or edema Skin: No rashes or ulcers Psychiatry:  Mood & affect appropriate.    Imaging and lab data was personally reviewed    CBC: Recent Labs  Lab 09/04/21 0515 09/05/21 0728 09/06/21 0043 09/07/21 0431 09/08/21 0416  WBC 13.3* 18.7* 13.6* 15.5* 19.1*  NEUTROABS  --   --  12.3* 14.0* 16.7*  HGB 8.2* 8.6* 7.8* 8.0* 8.2*  HCT 27.7* 29.0* 26.9* 25.7* 28.0*  MCV 87.9 87.1 88.5 83.7 86.2  PLT 244 359 305 334 017   Basic Metabolic Panel: Recent Labs  Lab 09/04/21 0000 09/04/21 0515 09/05/21 0728 09/06/21 0043 09/07/21 0431 09/08/21 0416  NA  --  142 144 141 138 140  K  --  4.0 3.0* 3.5 3.5 3.8  CL  --  108 104 103 102 99  CO2  --  '25 30 29 26 27  '$ GLUCOSE  --  58* 102* 234* 283* 255*  BUN  --  34* 34*  39* 53* 58*  CREATININE  --  2.05* 1.83* 1.82* 1.90* 1.99*  CALCIUM  --  8.5* 8.9 8.5* 8.9 9.2  MG 2.1  --  2.0 2.0 2.3 2.2  PHOS  --   --  3.6 3.0 3.9 3.8   GFR: Estimated Creatinine Clearance: 17.5 mL/min (A) (by C-G formula based on SCr of 1.99 mg/dL (H)).  Scheduled Meds:  amiodarone  200 mg Oral Daily   arformoterol  15 mcg Nebulization BID   budesonide (PULMICORT) nebulizer solution  0.25 mg Nebulization BID   clopidogrel  75 mg Oral Daily   memantine  28 mg Oral QHS   And   donepezil  10 mg Oral QHS   DULoxetine  60 mg Oral Daily   ezetimibe  10 mg Oral Daily   gabapentin  300 mg Oral BID    insulin aspart  0-15 Units Subcutaneous TID WC   insulin glargine-yfgn  15 Units Subcutaneous Daily   ipratropium  0.5 mg Nebulization BID   isosorbide dinitrate  30 mg Oral BID   levalbuterol  0.63 mg Nebulization BID   methylPREDNISolone (SOLU-MEDROL) injection  40 mg Intravenous Q24H   metoprolol succinate  12.5 mg Oral Daily   pantoprazole  40 mg Oral Daily   rosuvastatin  10 mg Oral QAC supper   sodium chloride flush  3 mL Intravenous Q12H   Continuous Infusions:  cefTRIAXone (ROCEPHIN)  IV 2 g (09/08/21 1341)     LOS: 5 days   Author: Debbe Odea  09/08/2021 6:34 PM

## 2021-09-08 NOTE — Progress Notes (Signed)
Mobility Specialist - Progress Note     09/08/21 1336  Mobility  Activity Transferred to/from [facility]  Level of Assistance Moderate assist, patient does 50-74%  Assistive Device BSC  Distance Ambulated (ft) 5 ft  Activity Response Tolerated well  $Mobility charge 1 Mobility   Pt was found on BSC and aided Pt to recliner chair. Chair alarm was on and all necessities are in reach of Pt.  Ferd Hibbs Mobility Specialist

## 2021-09-09 ENCOUNTER — Inpatient Hospital Stay (HOSPITAL_COMMUNITY): Payer: Medicare Other

## 2021-09-09 DIAGNOSIS — J9601 Acute respiratory failure with hypoxia: Secondary | ICD-10-CM | POA: Diagnosis not present

## 2021-09-09 DIAGNOSIS — J189 Pneumonia, unspecified organism: Secondary | ICD-10-CM | POA: Diagnosis not present

## 2021-09-09 DIAGNOSIS — N179 Acute kidney failure, unspecified: Secondary | ICD-10-CM | POA: Diagnosis not present

## 2021-09-09 DIAGNOSIS — I5023 Acute on chronic systolic (congestive) heart failure: Secondary | ICD-10-CM | POA: Diagnosis not present

## 2021-09-09 LAB — GLUCOSE, CAPILLARY
Glucose-Capillary: 250 mg/dL — ABNORMAL HIGH (ref 70–99)
Glucose-Capillary: 262 mg/dL — ABNORMAL HIGH (ref 70–99)
Glucose-Capillary: 161 mg/dL — ABNORMAL HIGH (ref 70–99)

## 2021-09-09 LAB — BLOOD GAS, ARTERIAL
Acid-Base Excess: 5.8 mmol/L — ABNORMAL HIGH (ref 0.0–2.0)
Bicarbonate: 31.2 mmol/L — ABNORMAL HIGH (ref 20.0–28.0)
Drawn by: 22052
FIO2: 4 %
O2 Content: 36 L/min
O2 Saturation: 96.2 %
Patient temperature: 37
pCO2 arterial: 47 mmHg (ref 32–48)
pH, Arterial: 7.43 (ref 7.35–7.45)
pO2, Arterial: 69 mmHg — ABNORMAL LOW (ref 83–108)

## 2021-09-09 LAB — CBC
HCT: 26.6 % — ABNORMAL LOW (ref 36.0–46.0)
Hemoglobin: 7.9 g/dL — ABNORMAL LOW (ref 12.0–15.0)
MCH: 25.9 pg — ABNORMAL LOW (ref 26.0–34.0)
MCHC: 29.7 g/dL — ABNORMAL LOW (ref 30.0–36.0)
MCV: 87.2 fL (ref 80.0–100.0)
Platelets: 321 10*3/uL (ref 150–400)
RBC: 3.05 MIL/uL — ABNORMAL LOW (ref 3.87–5.11)
RDW: 17.3 % — ABNORMAL HIGH (ref 11.5–15.5)
WBC: 18.8 10*3/uL — ABNORMAL HIGH (ref 4.0–10.5)
nRBC: 0.6 % — ABNORMAL HIGH (ref 0.0–0.2)

## 2021-09-09 MED ORDER — EMPAGLIFLOZIN 10 MG PO TABS: 10.0000 mg | ORAL_TABLET | Freq: Every day | ORAL | Status: DC

## 2021-09-09 MED ORDER — CHLORHEXIDINE GLUCONATE CLOTH 2 % EX PADS
6.0000 | MEDICATED_PAD | Freq: Every day | CUTANEOUS | Status: DC
  Administered 2021-09-09 – 2021-09-16 (×8): 6 via TOPICAL

## 2021-09-09 MED ORDER — FUROSEMIDE 10 MG/ML IJ SOLN: 40.0000 mg | Freq: Once | INTRAMUSCULAR | Status: AC

## 2021-09-09 MED ORDER — SODIUM CHLORIDE 0.9 % IV SOLN
2.0000 g | Freq: Every day | INTRAVENOUS | Status: DC
  Administered 2021-09-09 – 2021-09-12 (×4): 2 g via INTRAVENOUS
  Filled 2021-09-09 (×4): qty 12.5

## 2021-09-09 MED ORDER — FUROSEMIDE 10 MG/ML IJ SOLN
INTRAMUSCULAR | Status: AC
  Administered 2021-09-09: 40 mg
  Filled 2021-09-09: qty 4

## 2021-09-09 MED ORDER — METHYLPREDNISOLONE SODIUM SUCC 125 MG IJ SOLR
90.0000 mg | Freq: Two times a day (BID) | INTRAMUSCULAR | Status: DC
  Administered 2021-09-09 – 2021-09-10 (×2): 90 mg via INTRAVENOUS
  Filled 2021-09-09 (×2): qty 2

## 2021-09-09 MED ORDER — LORAZEPAM 2 MG/ML IJ SOLN
0.5000 mg | Freq: Once | INTRAMUSCULAR | Status: AC
  Administered 2021-09-09: 0.5 mg via INTRAVENOUS
  Filled 2021-09-09: qty 1

## 2021-09-09 MED ORDER — SODIUM CHLORIDE 0.9 % IV SOLN: INTRAVENOUS | Status: DC | PRN

## 2021-09-09 MED ORDER — IPRATROPIUM-ALBUTEROL 0.5-2.5 (3) MG/3ML IN SOLN
3.0000 mL | Freq: Once | RESPIRATORY_TRACT | Status: AC
Start: 2021-09-09 — End: 2021-09-09
  Administered 2021-09-09: 3 mL via RESPIRATORY_TRACT
  Filled 2021-09-09: qty 3

## 2021-09-09 MED ORDER — MIRABEGRON ER 25 MG PO TB24
25.0000 mg | ORAL_TABLET | Freq: Every day | ORAL | Status: DC
  Administered 2021-09-09 – 2021-09-13 (×5): 25 mg via ORAL
  Filled 2021-09-09 (×8): qty 1

## 2021-09-09 MED ORDER — LINAGLIPTIN 5 MG PO TABS
5.0000 mg | ORAL_TABLET | Freq: Every day | ORAL | Status: DC
  Administered 2021-09-09 – 2021-09-13 (×5): 5 mg via ORAL
  Filled 2021-09-09 (×6): qty 1

## 2021-09-09 NOTE — Inpatient Diabetes Management (Signed)
Inpatient Diabetes Program Recommendations  AACE/ADA: New Consensus Statement on Inpatient Glycemic Control (2015)  Target Ranges:  Prepandial:   less than 140 mg/dL      Peak postprandial:   less than 180 mg/dL (1-2 hours)      Critically ill patients:  140 - 180 mg/dL   Lab Results  Component Value Date   GLUCAP 262 (H) 09/09/2021   HGBA1C 7.8 (H) 07/07/2021    Review of Glycemic Control  Diabetes history: DM2 Outpatient Diabetes medications: Jardiance 10 QD, Lantus 20-40 before breakfast Current orders for Inpatient glycemic control: Semglee 15 units QD, Novolog 0-15 TID, tradjenta 5 mg QD  HgbA1C - 7.8%  Inpatient Diabetes Program Recommendations:    Consider adding Novolog 3 units TID with meals if eating > 50%  Increase Semglee to 18 units QD  Continue to follow.   Thank you. Lorenda Peck, RD, LDN, [facility] Inpatient Diabetes Coordinator [phone]

## 2021-09-09 NOTE — Care Management Important Message (Signed)
Important Message  Patient Details IM Letter placed in Patients room for family Name: ANTONELA FREIMAN MRN: [id number] Date of Birth: 01-16-1932   Medicare Important Message Given:  Yes     Kerin Salen 09/09/2021, 12:51 PM

## 2021-09-09 NOTE — Progress Notes (Signed)
PHARMACY NOTE -  Cefepime  Pharmacy has been assisting with dosing of cefepime for pneumonia. Dosage remains stable at 2g IV q24 hr and further renal adjustments per institutional Pharmacy antibiotic protocol (SCr appears to be near baseline)  Pharmacy will sign off, following peripherally for culture results or dose adjustments. Please reconsult if a change in clinical status warrants re-evaluation of dosage.  Reuel Boom, PharmD, BCPS [phone] 09/09/2021, 7:22 PM

## 2021-09-09 NOTE — Progress Notes (Signed)
MD notified regarding change of status, dyspnea now with rest as well as exertion, Resp 36 per min, expiratory wheezing and accessory abdominal muscle use. Pt A&Ox3 which is baseline, able to follow commands, see vitals recorded at 1519. MD, Dr. Wynelle Cleveland to come assess pt. Will continue to monitor.

## 2021-09-09 NOTE — Consult Note (Signed)
NAME:  Linda Oliver, MRN:  [id number], DOB:  [birth year], LOS: 6 ADMISSION DATE:  09/05/2021, CONSULTATION DATE:  8/31 REFERRING MD:  Dr. Wynelle Cleveland, CHIEF COMPLAINT:  Hypoxia   History of Present Illness:  88 year old female with PMH as below, which is significant for DM, CAD s/p CABG, CVA, HTN, OSA on CPAP, PAF on eliquis, Seizure, and UTI. Most recently admitted back in June for CHF exacerbation and Atrial fibrillation. Improved with diuresis and cardioversion respectively. New tx at discharge included amiodarone and oxygen 2 liters. She presented to [facility] ED 8/25 with complaints of weakness and delirium progressive over a five day period. Poor PO intake and urinary output noted as well. Upon arrival to the ED she was noted to be febrile to 100.5 and have what appeared to be multifocal pneumonia on imaging. She was admitted to the hospitalists for CAP treatment including antibiotics and steroids. Blood cultures positive for E. Coli. She continued to improve with antibiotic therapy until 8/31, when she began to develop shortness of breath, which devolved into hypoxia and respiratory distress. She was transferred to ICU and supplemental oxygen was increased. CT chest consistent with multifocal infiltrates. PCCM consulted.   Pertinent  Medical History   has a past medical history of Anginal pain ([place]), Anxiety, Broken ankle, Bullous pemphigus, CAD (coronary artery disease), Depression, Diabetic peripheral neuropathy ([place]), Encounter for loop recorder check (04/11/2019), GERD (gastroesophageal reflux disease), H/O: stroke: Left occipital stroke and vision loss (08/24/2018), Headache(784.0), Heart murmur, Hyperlipidemia, Hypertension, Hypertensive heart disease, Loop recorder Reveal 08/24/2018 (04/11/2019), Nasal bleeding, OA (osteoarthritis), Obesity (BMI 30-39.9), Obstructive sleep apnea on CPAP, Osteopenia, Paroxysmal atrial fibrillation ([place]) (04/17/2019), Peripheral neuropathy, Seizure ([place]), Sleep apnea,  Stones in the urinary tract, Type 2 diabetes mellitus with vascular disease ([place]), and UTI (lower urinary tract infection).   Significant Hospital Events: Including procedures, antibiotic start and stop dates in addition to other pertinent events   8/25 admit for CAP 8/27 BC postive for E. Coli.  8/31 tx to ICU in respiratory distress.   Interim History / Subjective:    Objective   Blood pressure (!) 164/53, pulse 76, temperature 99.1 F (37.3 C), temperature source Oral, resp. rate (!) 36, height 5' (1.524 m), weight 71.3 kg, SpO2 95 %.    FiO2 (%):  [32 %] 32 %   Intake/Output Summary (Last 24 hours) at 09/09/2021 1811 Last data filed at 09/09/2021 1700 Gross per 24 hour  Intake 720 ml  Output 1000 ml  Net -280 ml   Filed Weights   09/04/21 0500 09/05/21 0500 09/09/21 0500  Weight: 71.7 kg 68.1 kg 71.3 kg    Examination: General: Frail elderly appearing female in mild-moderate respiratory distress HENT: [place]/AT, PERRL, no JVD Lungs: Coarse crackles throughout. Using accessory muscles.  Cardiovascular: RRR, no MRG Abdomen: Soft, non-tender, non-distended.  Extremities: No acute deformity. No edema.  Neuro: Awake, alert, confused.   Resolved Hospital Problem list     Assessment & Plan:   Multifocal infiltrates: differential is broad. Infectious vs inflammatory. Has been treated for CAP x 5 days. Admitted in June so may have healthcare associated PNA. Has recently been restarted on amiodarone. Amiodarone toxicity? Seems to have worsened today without steroids.  - Trial of BiPAP - Supplemental O2 to keep sats > 92% - Escalate ABX to cefepime.  - Will re-start steroids - RVP - Hold amiodarone  AF Chronic HFrEF - hold amiodarone if feasible  - rest per primary/cardiology who have been consulted.  Acute metabolic encephalopathy Dementia - treat hypoxia as above - Supportive care  OSA on CPAP - BiPAP tonight  DM - per primary  Best Practice (right click and  "Reselect all SmartList Selections" daily)   Diet/type: NPO DVT prophylaxis: DOAC GI prophylaxis: N/A Lines: N/A Foley:  N/A Code Status:  full code Last date of multidisciplinary goals of care discussion '[ ]'$   Labs   CBC: Recent Labs  Lab 09/05/21 0728 09/06/21 0043 09/07/21 0431 09/08/21 0416 09/09/21 0806  WBC 18.7* 13.6* 15.5* 19.1* 18.8*  NEUTROABS  --  12.3* 14.0* 16.7*  --   HGB 8.6* 7.8* 8.0* 8.2* 7.9*  HCT 29.0* 26.9* 25.7* 28.0* 26.6*  MCV 87.1 88.5 83.7 86.2 87.2  PLT 359 305 334 366 834    Basic Metabolic Panel: Recent Labs  Lab 09/04/21 0000 09/04/21 0515 09/05/21 0728 09/06/21 0043 09/07/21 0431 09/08/21 0416  NA  --  142 144 141 138 140  K  --  4.0 3.0* 3.5 3.5 3.8  CL  --  108 104 103 102 99  CO2  --  '25 30 29 26 27  '$ GLUCOSE  --  58* 102* 234* 283* 255*  BUN  --  34* 34* 39* 53* 58*  CREATININE  --  2.05* 1.83* 1.82* 1.90* 1.99*  CALCIUM  --  8.5* 8.9 8.5* 8.9 9.2  MG 2.1  --  2.0 2.0 2.3 2.2  PHOS  --   --  3.6 3.0 3.9 3.8   GFR: Estimated Creatinine Clearance: 17.5 mL/min (A) (by C-G formula based on SCr of 1.99 mg/dL (H)). Recent Labs  Lab 08/25/2021 1435 08/14/2021 1636 08/18/2021 1930 09/04/21 0000 09/06/21 0043 09/07/21 0431 09/08/21 0416 09/09/21 0806  WBC  --   --   --    < > 13.6* 15.5* 19.1* 18.8*  LATICACIDVEN 1.4 2.3* 2.2*  --   --   --   --   --    < > = values in this interval not displayed.    Liver Function Tests: Recent Labs  Lab 09/04/21 0515 09/05/21 0728 09/06/21 0043 09/07/21 0431 09/08/21 0416  AST 124* 93* 53* 43* 40  ALT 60* 68* 53* 47* 47*  ALKPHOS 108 127* 125 141* 146*  BILITOT 0.9 1.1 1.0 0.8 0.9  PROT 6.2* 6.3* 5.9* 6.2* 6.4*  ALBUMIN 2.6* 2.5* 2.3* 2.3* 2.3*   No results for input(s): "LIPASE", "AMYLASE" in the last 168 hours. No results for input(s): "AMMONIA" in the last 168 hours.  ABG    Component Value Date/Time   PHART 7.43 09/09/2021 1549   PCO2ART 47 09/09/2021 1549   PO2ART 69 (L)  09/09/2021 1549   HCO3 31.2 (H) 09/09/2021 1549   TCO2 37 (H) 07/12/2021 1259   O2SAT 96.2 09/09/2021 1549     Coagulation Profile: Recent Labs  Lab 09/06/2021 1250  INR 3.5*    Cardiac Enzymes: No results for input(s): "CKTOTAL", "CKMB", "CKMBINDEX", "TROPONINI" in the last 168 hours.  HbA1C: Hgb A1c MFr Bld  Date/Time Value Ref Range Status  07/07/2021 05:25 AM 7.8 (H) 4.8 - 5.6 % Final    Comment:    (NOTE) Pre diabetes:          5.7%-6.4%  Diabetes:              >6.4%  Glycemic control for   <7.0% adults with diabetes   08/23/2018 04:34 AM 8.7 (H) 4.8 - 5.6 % Final    Comment:    (NOTE) Pre  diabetes:          5.7%-6.4% Diabetes:              >6.4% Glycemic control for   <7.0% adults with diabetes     CBG: Recent Labs  Lab 09/08/21 1655 09/08/21 2020 09/09/21 0753 09/09/21 1132 09/09/21 1751  GLUCAP 171* 212* 250* 262* 161*    Review of Systems:   Patient is encephalopathic and/or intubated. Therefore history has been obtained from chart review.    Past Medical History:  She,  has a past medical history of Anginal pain ([place]), Anxiety, Broken ankle, Bullous pemphigus, CAD (coronary artery disease), Depression, Diabetic peripheral neuropathy ([place]), Encounter for loop recorder check (04/11/2019), GERD (gastroesophageal reflux disease), H/O: stroke: Left occipital stroke and vision loss (08/24/2018), Headache(784.0), Heart murmur, Hyperlipidemia, Hypertension, Hypertensive heart disease, Loop recorder Reveal 08/24/2018 (04/11/2019), Nasal bleeding, OA (osteoarthritis), Obesity (BMI 30-39.9), Obstructive sleep apnea on CPAP, Osteopenia, Paroxysmal atrial fibrillation ([place]) (04/17/2019), Peripheral neuropathy, Seizure ([place]), Sleep apnea, Stones in the urinary tract, Type 2 diabetes mellitus with vascular disease ([place]), and UTI (lower urinary tract infection).   Surgical History:   Past Surgical History:  Procedure Laterality Date   ABDOMINAL HYSTERECTOMY  70's    CARDIAC CATHETERIZATION     CARDIOVERSION N/A 07/08/2021   Procedure: CARDIOVERSION;  Surgeon: Adrian Prows, MD;  Location: [facility];  Service: Cardiovascular;  Laterality: N/A;   CARPAL TUNNEL RELEASE Bilateral    CATARACT EXTRACTION W/PHACO Bilateral    CHOLECYSTECTOMY     CORONARY ANGIOPLASTY WITH STENT PLACEMENT  2023   CORONARY ARTERY BYPASS GRAFT  1996   EYE SURGERY Left    LOOP RECORDER INSERTION N/A 08/24/2018   Procedure: LOOP RECORDER INSERTION;  Surgeon: Constance Haw, MD;  Location: [facility];  Service: Cardiovascular;  Laterality: N/A;   RIGHT/LEFT HEART CATH AND CORONARY/GRAFT ANGIOGRAPHY N/A 07/12/2021   Procedure: RIGHT/LEFT HEART CATH AND CORONARY/GRAFT ANGIOGRAPHY;  Surgeon: Nigel Mormon, MD;  Location: [facility];  Service: Cardiovascular;  Laterality: N/A;   SHOULDER ARTHROSCOPY     rt   SHOULDER ARTHROSCOPY WITH ROTATOR CUFF REPAIR AND SUBACROMIAL DECOMPRESSION  12/01/2011   Procedure: SHOULDER ARTHROSCOPY WITH ROTATOR CUFF REPAIR AND SUBACROMIAL DECOMPRESSION;  Surgeon: Nita Sells, MD;  Location: [facility];  Service: Orthopedics;  Laterality: Left;  shoulder debridement calcific tendonitis     Social History:   reports that she quit smoking about 34 years ago. Her smoking use included cigarettes. She has a 30.00 pack-year smoking history. She has never used smokeless tobacco. She reports that she does not drink alcohol and does not use drugs.   Family History:  Her family history includes Cancer in her brother and father; Cancer - Colon in her brother; Coronary artery disease in her father and mother; Heart disease in her father.   Allergies Allergies  Allergen Reactions   Amoxicillin-Pot Clavulanate Swelling    Face and lip swelling   Codeine Nausea And Vomiting   Metformin Diarrhea and Nausea And Vomiting   Oxycodone-Acetaminophen Other (See Comments)    Unknown reaction   Percocet [Oxycodone-Acetaminophen] Nausea And  Vomiting   Tolterodine Other (See Comments)    Unknown reaction   Latex Itching and Rash    EKG leads   Tape Itching and Rash     Home Medications  Prior to Admission medications   Medication Sig Start Date End Date Taking? Authorizing Provider  acetaminophen (TYLENOL) 500 MG tablet Take 1,000 mg by mouth every  4 (four) hours as needed for moderate pain or headache.   Yes [provider]  amiodarone (PACERONE) 200 MG tablet Take 1 tablet (200 mg total) by mouth daily. Patient taking differently: Take 200 mg by mouth 2 (two) times daily. 07/28/21 10/20/21 Yes Cantwell, Celeste C, PA-C  apixaban (ELIQUIS) 5 MG TABS tablet Take 1 tablet (5 mg total) by mouth 2 (two) times daily. Will need to see MD for future refills. Patient taking differently: Take 5 mg by mouth 2 (two) times daily. 08/22/19  Yes Camnitz, Will Hassell Done, MD  Cholecalciferol (VITAMIN D3) 50 MCG (2000 UT) capsule Take 2,000 Units by mouth at bedtime.   Yes [provider]  clopidogrel (PLAVIX) 75 MG tablet Take 1 tablet (75 mg total) by mouth daily. Patient taking differently: Take 75 mg by mouth every morning. 03/11/21  Yes Cantwell, Celeste C, PA-C  docusate sodium (STOOL SOFTENER) 100 MG capsule Take 100 mg by mouth daily as needed (constipation).   Yes [provider]  DULoxetine (CYMBALTA) 60 MG capsule Take 60 mg by mouth every morning. 10/16/17  Yes [provider]  empagliflozin (JARDIANCE) 10 MG TABS tablet Take 10 mg by mouth every morning.   Yes [provider]  ezetimibe (ZETIA) 10 MG tablet Take 10 mg by mouth every morning. 09/17/19  Yes [provider]  ferrous gluconate (FERGON) 240 (27 FE) MG tablet Take 240 mg by mouth every morning.   Yes [provider]  fesoterodine (TOVIAZ) 8 MG TB24 tablet Take 8 mg by mouth at bedtime.   Yes [provider]  fluticasone (FLONASE) 50 MCG/ACT nasal spray Place 1 spray into both nostrils 2 (two) times daily as  needed for allergies or rhinitis.   Yes [provider]  furosemide (LASIX) 40 MG tablet Take 1 tablet (40 mg total) by mouth daily. Patient taking differently: Take 40 mg by mouth every morning. 07/14/21 09/09/21 Yes Arrien, Jimmy Picket, MD  gabapentin (NEURONTIN) 300 MG capsule Take 300 mg by mouth 2 (two) times daily.   Yes [provider]  glimepiride (AMARYL) 1 MG tablet Take 1 mg by mouth every morning.   Yes [provider]  hydrOXYzine (ATARAX/VISTARIL) 25 MG tablet Take 25-50 mg by mouth See admin instructions. Take one tablet (25 mg) by mouth every morning and two tablets (50 mg) at night   Yes [provider]  Insulin Glargine (LANTUS SOLOSTAR) 100 UNIT/ML Solostar Pen Inject 20-40 Units into the skin daily before breakfast. Dose is based on CBG   Yes [provider]  isosorbide dinitrate (ISORDIL) 30 MG tablet TAKE 1 TABLET THREE TIMES A DAY Patient taking differently: Take 30 mg by mouth 2 (two) times daily. 07/06/21  Yes Adrian Prows, MD  loperamide (IMODIUM A-D) 2 MG tablet Take 2 mg by mouth 4 (four) times daily as needed for diarrhea or loose stools.   Yes [provider]  metoprolol succinate (TOPROL-XL) 25 MG 24 hr tablet Take 12.5 mg by mouth 2 (two) times daily.   Yes [provider]  mupirocin ointment (BACTROBAN) 2 % Apply 1 Application topically 2 (two) times daily. 09/02/21  Yes [provider]  NAMZARIC 28-10 MG CP24 Take 1 capsule by mouth daily. Patient taking differently: Take 1 capsule by mouth at bedtime. 06/09/21  Yes Melvenia Beam, MD  nitroGLYCERIN (NITROSTAT) 0.4 MG SL tablet Place 1 tablet (0.4 mg total) under the tongue every 5 (five) minutes as needed for up to 25 days  for chest pain. Patient taking differently: Place 0.4 mg under the tongue every 5 (five) minutes as needed for chest pain. 05/29/20 09/04/21 Yes Adrian Prows, MD  OVER THE COUNTER MEDICATION Apply 1 Application topically daily as  needed (leg cramps). Hyland's leg cramp ointment   Yes [provider]  OXYGEN Inhale 2 L into the lungs continuous.   Yes [provider]  pantoprazole (PROTONIX) 40 MG tablet Take 40 mg by mouth every morning. 10/31/17  Yes [provider]  potassium chloride SA (KLOR-CON M) 20 MEQ tablet TAKE 1 TABLET DAILY Patient taking differently: Take 20 mEq by mouth every morning. 01/29/21  Yes Adrian Prows, MD  PRESCRIPTION MEDICATION Inhale into the lungs at bedtime. CPAP   Yes [provider]  rosuvastatin (CRESTOR) 10 MG tablet Take 10 mg by mouth at bedtime.   Yes [provider]  sitaGLIPtin (JANUVIA) 100 MG tablet Take 100 mg by mouth every morning.   Yes [provider]  Vibegron (GEMTESA) 75 MG TABS Take 75 mg by mouth every morning.   Yes [provider]  vitamin B-12 (CYANOCOBALAMIN) 100 MCG tablet Take 100 mcg by mouth at bedtime.   Yes [provider]  B-D UF III MINI PEN NEEDLES 31G X 5 MM MISC SMARTSIG:1 Each SUB-Q Daily 03/14/19   [provider]  FREESTYLE LITE test strip Check blood sugar every morning 04/09/19   [provider]     Critical care time:      Georgann Housekeeper, AGACNP-BC [place] for personal pager PCCM on call pager [phone] until 7pm. Please call Elink 7p-7a. [phone]  09/09/2021 6:41 PM

## 2021-09-10 DIAGNOSIS — J189 Pneumonia, unspecified organism: Secondary | ICD-10-CM | POA: Diagnosis not present

## 2021-09-10 DIAGNOSIS — J9601 Acute respiratory failure with hypoxia: Secondary | ICD-10-CM | POA: Diagnosis not present

## 2021-09-10 LAB — RESPIRATORY PANEL BY PCR

## 2021-09-10 LAB — CBC
HCT: 26.1 % — ABNORMAL LOW (ref 36.0–46.0)
Hemoglobin: 7.8 g/dL — ABNORMAL LOW (ref 12.0–15.0)
MCH: 25.6 pg — ABNORMAL LOW (ref 26.0–34.0)
MCHC: 29.9 g/dL — ABNORMAL LOW (ref 30.0–36.0)
MCV: 85.6 fL (ref 80.0–100.0)
Platelets: 319 10*3/uL (ref 150–400)
RBC: 3.05 MIL/uL — ABNORMAL LOW (ref 3.87–5.11)
RDW: 17.5 % — ABNORMAL HIGH (ref 11.5–15.5)
WBC: 14.2 10*3/uL — ABNORMAL HIGH (ref 4.0–10.5)
nRBC: 0.5 % — ABNORMAL HIGH (ref 0.0–0.2)

## 2021-09-10 LAB — BASIC METABOLIC PANEL
Anion gap: 13 (ref 5–15)
BUN: 55 mg/dL — ABNORMAL HIGH (ref 8–23)
CO2: 25 mmol/L (ref 22–32)
Calcium: 8.3 mg/dL — ABNORMAL LOW (ref 8.9–10.3)
Chloride: 103 mmol/L (ref 98–111)
Creatinine, Ser: 1.68 mg/dL — ABNORMAL HIGH (ref 0.44–1.00)
GFR, Estimated: 29 mL/min — ABNORMAL LOW (ref >60)
Glucose, Bld: 216 mg/dL — ABNORMAL HIGH (ref 70–99)
Potassium: 4.3 mmol/L (ref 3.5–5.1)
Sodium: 141 mmol/L (ref 135–145)

## 2021-09-10 LAB — GLUCOSE, CAPILLARY
Glucose-Capillary: 116 mg/dL — ABNORMAL HIGH (ref 70–99)
Glucose-Capillary: 214 mg/dL — ABNORMAL HIGH (ref 70–99)
Glucose-Capillary: 178 mg/dL — ABNORMAL HIGH (ref 70–99)
Glucose-Capillary: 145 mg/dL — ABNORMAL HIGH (ref 70–99)
Glucose-Capillary: 177 mg/dL — ABNORMAL HIGH (ref 70–99)

## 2021-09-10 LAB — MRSA NEXT GEN BY PCR, NASAL: MRSA by PCR Next Gen: NOT DETECTED

## 2021-09-10 MED ORDER — ORAL CARE MOUTH RINSE
15.0000 mL | OROMUCOSAL | Status: DC
  Administered 2021-09-10 – 2021-09-16 (×23): 15 mL via OROMUCOSAL

## 2021-09-10 MED ORDER — APIXABAN 2.5 MG PO TABS
2.5000 mg | ORAL_TABLET | Freq: Two times a day (BID) | ORAL | Status: DC
  Administered 2021-09-10 – 2021-09-13 (×7): 2.5 mg via ORAL
  Filled 2021-09-10 (×8): qty 1

## 2021-09-10 MED ORDER — INSULIN GLARGINE-YFGN 100 UNIT/ML ~~LOC~~ SOLN
20.0000 [IU] | Freq: Every day | SUBCUTANEOUS | Status: DC
  Administered 2021-09-11 – 2021-09-12 (×2): 20 [IU] via SUBCUTANEOUS
  Filled 2021-09-10 (×4): qty 0.2

## 2021-09-10 MED ORDER — METHYLPREDNISOLONE SODIUM SUCC 125 MG IJ SOLR
60.0000 mg | Freq: Three times a day (TID) | INTRAMUSCULAR | Status: DC
  Administered 2021-09-10 – 2021-09-14 (×12): 60 mg via INTRAVENOUS
  Filled 2021-09-10 (×12): qty 2

## 2021-09-10 MED ORDER — ORAL CARE MOUTH RINSE: 15.0000 mL | OROMUCOSAL | Status: DC | PRN

## 2021-09-10 NOTE — Consult Note (Signed)
CARDIOLOGY CONSULT NOTE  Patient ID: Linda Oliver MRN: [id number] DOB/AGE: December 27, 1932 88 y.o.  Admit date: 08/29/2021 Referring Physician  Star Age, MD Primary Physician:  Shon Baton, MD Reason for Consultation  CHF  Patient ID: Linda Oliver, female    DOB: 02-14-1932, 88 y.o.   MRN: [id number]  Chief Complaint  Patient presents with   Altered Mental Status  Shortness of breath HPI:    Linda Oliver  is a 88 y.o. Caucasian female patient with CAD s/p CABG x 3 in 1996 in [place], HTN, hyperlipidemia, diabetes with PVD and stage 3a CKD and sleep apnea on CPAP and now compliant, carotid stenosis, and history of  autonomic orthostatic hypotension, CVA with medial left occipital infarct in embolic pattern by MRI in 2020 and loop recorder implanted showed paroxysmal atrial fibrillation.  Admitted with unstable angina and underwent coronary angiography on 03/05/2021 and overlapping stent implantation to the right coronary artery native as the SVG to RCA was functionally occluded.  Left grafts are widely patent.   Patient was readmitted on 07/06/2021 with findings suggestive of acute pulm edema respiratory distress revealing again patent grafts.  No change in coronary anatomy, discharged home after diuresis with a diagnosis of NSTEMI/unstable angina.  She was discharged home on 07/13/2021 with amiodarone on board for paroxysmal atrial fibrillation with RVR.  Amiodarone was started after failed cardioversion on 07/08/2021.  Readmitted on 08/19/2021 with altered mental status, respiratory distress and is gradually progressed to worsening pulmonary infiltrates noted on the chest x-ray and also CT scan.  I was consulted to evaluate her for heart failure.  Patient presently in mild respiratory distress.  Family present at the bedside.  Denies chest pain or palpitations.  Past Medical History:  Diagnosis Date   Anginal pain ([place])    occ   Anxiety    Broken ankle    Bullous pemphigus    CAD (coronary artery  disease)    [place]   80% stenosis ostial Left main, 30% stenosis proximal LAD, 80% stenosis proximal RCA CABG  -  LIMA to LAD, SVG to int, SVG to [place], [place]    Depression    Diabetic peripheral neuropathy ([place])    Encounter for loop recorder check 04/11/2019   GERD (gastroesophageal reflux disease)    H/O: stroke: Left occipital stroke and vision loss 08/24/2018   Headache(784.0)    hx migraines   Heart murmur    mvp   Hyperlipidemia    Hypertension    Hypertensive heart disease    Loop recorder Reveal 08/24/2018 04/11/2019   Scheduled Remote loop recorder check 03/10/2019: The presenting rhythm is sinus bradycardia. There were 0 symptomatic patient activations recorded. There were 275 AF episodes and 0 AT episodes recorded, representing a 4.2 % cumulative AT/AF burden. The longest episode lasted 00:03:40:00 in duration. Some false with PAC. There were 0 tachy episodes detected. There were 0 pause episodes detected. Th   Nasal bleeding    occ due to O2   OA (osteoarthritis)    Obesity (BMI 30-39.9)    Obstructive sleep apnea on CPAP    Osteopenia    Paroxysmal atrial fibrillation ([place]) 04/17/2019   Peripheral neuropathy    Seizure ([place])    2009   Sleep apnea    dx 26yr ago doesnt use cpap but uses O2 at nite 2L   Stones in the urinary tract    Type 2 diabetes mellitus with vascular disease ([place]  UTI (lower urinary tract infection)    Past Surgical History:  Procedure Laterality Date   ABDOMINAL HYSTERECTOMY  70's   CARDIAC CATHETERIZATION     CARDIOVERSION N/A 07/08/2021   Procedure: CARDIOVERSION;  Surgeon: Adrian Prows, MD;  Location: [facility];  Service: Cardiovascular;  Laterality: N/A;   CARPAL TUNNEL RELEASE Bilateral    CATARACT EXTRACTION W/PHACO Bilateral    CHOLECYSTECTOMY     CORONARY ANGIOPLASTY WITH STENT PLACEMENT  2023   CORONARY ARTERY BYPASS GRAFT  1996   EYE SURGERY Left    LOOP RECORDER INSERTION N/A 08/24/2018   Procedure: LOOP  RECORDER INSERTION;  Surgeon: Constance Haw, MD;  Location: [facility];  Service: Cardiovascular;  Laterality: N/A;   RIGHT/LEFT HEART CATH AND CORONARY/GRAFT ANGIOGRAPHY N/A 07/12/2021   Procedure: RIGHT/LEFT HEART CATH AND CORONARY/GRAFT ANGIOGRAPHY;  Surgeon: Nigel Mormon, MD;  Location: [facility];  Service: Cardiovascular;  Laterality: N/A;   SHOULDER ARTHROSCOPY     rt   SHOULDER ARTHROSCOPY WITH ROTATOR CUFF REPAIR AND SUBACROMIAL DECOMPRESSION  12/01/2011   Procedure: SHOULDER ARTHROSCOPY WITH ROTATOR CUFF REPAIR AND SUBACROMIAL DECOMPRESSION;  Surgeon: Nita Sells, MD;  Location: [facility];  Service: Orthopedics;  Laterality: Left;  shoulder debridement calcific tendonitis   Social History   Tobacco Use   Smoking status: Former    Packs/day: 1.00    Years: 30.00    Total pack years: 30.00    Types: Cigarettes    Quit date: 11/25/1986    Years since quitting: 34.8   Smokeless tobacco: Never  Substance Use Topics   Alcohol use: No    Family History  Problem Relation Age of Onset   Coronary artery disease Mother    Heart disease Father    Cancer Father        skin   Coronary artery disease Father    Cancer Brother    Cancer - Colon Brother        brain, Lymphoma    Marital Status: Widowed  ROS  Review of Systems  Cardiovascular:  Positive for dyspnea on exertion. Negative for chest pain and leg swelling.   Objective      09/10/2021    7:45 AM 09/10/2021    6:00 AM 09/10/2021    5:00 AM  Vitals with BMI  Systolic 948 546 270  Diastolic 25 30 46  Pulse 84 79 81    Blood pressure (!) 150/25, pulse 84, temperature (!) 97.5 F (36.4 C), temperature source Axillary, resp. rate 19, height 5' (1.524 m), weight 68.3 kg, SpO2 94 %.    Physical Exam Neck:     Vascular: No carotid bruit or JVD.  Cardiovascular:     Rate and Rhythm: Normal rate and regular rhythm.     Pulses: Intact distal pulses.     Heart sounds: Normal heart  sounds. No murmur heard.    No gallop.  Pulmonary:     Effort: Pulmonary effort is normal.     Breath sounds: Rhonchi (coarse and diffuse extensive bilateral) present.  Abdominal:     General: Bowel sounds are normal.     Palpations: Abdomen is soft.  Musculoskeletal:     Right lower leg: No edema.     Left lower leg: No edema.    Laboratory examination:    Recent Labs    09/06/21 0043 09/07/21 0431 09/08/21 0416  NA 141 138 140  K 3.5 3.5 3.8  CL 103 102 99  CO2  _0 GLUCOSE 234* 283* 255*  BUN 39* 53* 58*  CREATININE 1.82* 1.90* 1.99*  CALCIUM 8.5* 8.9 9.2  GFRNONAA 27* 25* 24*   estimated creatinine clearance is 17.2 mL/min (A) (by C-G formula based on SCr of 1.99 mg/dL (H)).     Latest Ref Rng & Units 09/08/2021    4:16 AM 09/07/2021    4:31 AM 09/06/2021   12:43 AM  CMP  Glucose 70 - 99 mg/dL 255  283  234   BUN 8 - 23 mg/dL 58  53  39   Creatinine 0.44 - 1.00 mg/dL 1.99  1.90  1.82   Sodium 135 - 145 mmol/L 140  138  141   Potassium 3.5 - 5.1 mmol/L 3.8  3.5  3.5   Chloride 98 - 111 mmol/L 99  102  103   CO2 22 - 32 mmol/L _1 Calcium 8.9 - 10.3 mg/dL 9.2  8.9  8.5   Total Protein 6.5 - 8.1 g/dL 6.4  6.2  5.9   Total Bilirubin 0.3 - 1.2 mg/dL 0.9  0.8  1.0   Alkaline Phos 38 - 126 U/L 146  141  125   AST 15 - 41 U/L 40  43  53   ALT 0 - 44 U/L 47  47  53       Latest Ref Rng & Units 09/09/2021    8:06 AM 09/08/2021    4:16 AM 09/07/2021    4:31 AM  CBC  WBC 4.0 - 10.5 K/uL 18.8  19.1  15.5   Hemoglobin 12.0 - 15.0 g/dL 7.9  8.2  8.0   Hematocrit 36.0 - 46.0 % 26.6  28.0  25.7   Platelets 150 - 400 K/uL 321  366  334    Lipid Panel Recent Labs    07/09/21 0705  CHOL 81  TRIG 76  LDLCALC 35  VLDL 15  HDL 31*  CHOLHDL 2.6    HEMOGLOBIN A1C Lab Results  Component Value Date   HGBA1C 7.8 (H) 07/07/2021   MPG 177.16 07/07/2021   TSH Recent Labs    07/08/21 1547  TSH 1.097   BNP (last 3 results) Recent Labs     07/09/21 0705 08/16/2021 1250 09/05/21 0728  BNP 205.2* 417.3* 849.9*   Medications and allergies   Allergies  Allergen Reactions   Amoxicillin-Pot Clavulanate Swelling    Face and lip swelling   Codeine Nausea And Vomiting   Metformin Diarrhea and Nausea And Vomiting   Oxycodone-Acetaminophen Other (See Comments)    Unknown reaction   Percocet [Oxycodone-Acetaminophen] Nausea And Vomiting   Tolterodine Other (See Comments)    Unknown reaction   Latex Itching and Rash    EKG leads   Tape Itching and Rash     Current Meds  Medication Sig   acetaminophen (TYLENOL) 500 MG tablet Take 1,000 mg by mouth every 4 (four) hours as needed for moderate pain or headache.   amiodarone (PACERONE) 200 MG tablet Take 1 tablet (200 mg total) by mouth daily. (Patient taking differently: Take 200 mg by mouth 2 (two) times daily.)   apixaban (ELIQUIS) 5 MG TABS tablet Take 1 tablet (5 mg total) by mouth 2 (two) times daily. Will need to see MD for future refills. (Patient taking differently: Take 5 mg by mouth 2 (two) times daily.)   Cholecalciferol (VITAMIN D3) 50 MCG (2000 UT) capsule Take 2,000 Units by mouth at bedtime.  clopidogrel (PLAVIX) 75 MG tablet Take 1 tablet (75 mg total) by mouth daily. (Patient taking differently: Take 75 mg by mouth every morning.)   docusate sodium (STOOL SOFTENER) 100 MG capsule Take 100 mg by mouth daily as needed (constipation).   DULoxetine (CYMBALTA) 60 MG capsule Take 60 mg by mouth every morning.   empagliflozin (JARDIANCE) 10 MG TABS tablet Take 10 mg by mouth every morning.   ezetimibe (ZETIA) 10 MG tablet Take 10 mg by mouth every morning.   ferrous gluconate (FERGON) 240 (27 FE) MG tablet Take 240 mg by mouth every morning.   fesoterodine (TOVIAZ) 8 MG TB24 tablet Take 8 mg by mouth at bedtime.   fluticasone (FLONASE) 50 MCG/ACT nasal spray Place 1 spray into both nostrils 2 (two) times daily as needed for allergies or rhinitis.   furosemide (LASIX) 40  MG tablet Take 1 tablet (40 mg total) by mouth daily. (Patient taking differently: Take 40 mg by mouth every morning.)   gabapentin (NEURONTIN) 300 MG capsule Take 300 mg by mouth 2 (two) times daily.   glimepiride (AMARYL) 1 MG tablet Take 1 mg by mouth every morning.   hydrOXYzine (ATARAX/VISTARIL) 25 MG tablet Take 25-50 mg by mouth See admin instructions. Take one tablet (25 mg) by mouth every morning and two tablets (50 mg) at night   Insulin Glargine (LANTUS SOLOSTAR) 100 UNIT/ML Solostar Pen Inject 20-40 Units into the skin daily before breakfast. Dose is based on CBG   isosorbide dinitrate (ISORDIL) 30 MG tablet TAKE 1 TABLET THREE TIMES A DAY (Patient taking differently: Take 30 mg by mouth 2 (two) times daily.)   loperamide (IMODIUM A-D) 2 MG tablet Take 2 mg by mouth 4 (four) times daily as needed for diarrhea or loose stools.   metoprolol succinate (TOPROL-XL) 25 MG 24 hr tablet Take 12.5 mg by mouth 2 (two) times daily.   mupirocin ointment (BACTROBAN) 2 % Apply 1 Application topically 2 (two) times daily.   NAMZARIC 28-10 MG CP24 Take 1 capsule by mouth daily. (Patient taking differently: Take 1 capsule by mouth at bedtime.)   nitroGLYCERIN (NITROSTAT) 0.4 MG SL tablet Place 1 tablet (0.4 mg total) under the tongue every 5 (five) minutes as needed for up to 25 days for chest pain. (Patient taking differently: Place 0.4 mg under the tongue every 5 (five) minutes as needed for chest pain.)   OVER THE COUNTER MEDICATION Apply 1 Application topically daily as needed (leg cramps). Hyland's leg cramp ointment   OXYGEN Inhale 2 L into the lungs continuous.   pantoprazole (PROTONIX) 40 MG tablet Take 40 mg by mouth every morning.   potassium chloride SA (KLOR-CON M) 20 MEQ tablet TAKE 1 TABLET DAILY (Patient taking differently: Take 20 mEq by mouth every morning.)   PRESCRIPTION MEDICATION Inhale into the lungs at bedtime. CPAP   rosuvastatin (CRESTOR) 10 MG tablet Take 10 mg by mouth at  bedtime.   sitaGLIPtin (JANUVIA) 100 MG tablet Take 100 mg by mouth every morning.   Vibegron (GEMTESA) 75 MG TABS Take 75 mg by mouth every morning.   vitamin B-12 (CYANOCOBALAMIN) 100 MCG tablet Take 100 mcg by mouth at bedtime.   [DISCONTINUED] Cephalexin 500 MG tablet Take 500 mg by mouth 2 (two) times daily.   [DISCONTINUED] QUEtiapine (SEROQUEL) 25 MG tablet Take 12.5 mg by mouth at bedtime.    Scheduled Meds:  apixaban  5 mg Oral BID   Chlorhexidine Gluconate Cloth  6 each Topical Daily  clopidogrel  75 mg Oral Daily   memantine  28 mg Oral QHS   And   donepezil  10 mg Oral QHS   DULoxetine  60 mg Oral Daily   ezetimibe  10 mg Oral Daily   gabapentin  300 mg Oral BID   insulin aspart  0-15 Units Subcutaneous TID WC   insulin glargine-yfgn  15 Units Subcutaneous Daily   isosorbide dinitrate  30 mg Oral BID   linagliptin  5 mg Oral Daily   methylPREDNISolone (SOLU-MEDROL) injection  90 mg Intravenous Q12H   metoprolol succinate  12.5 mg Oral Daily   mirabegron ER  25 mg Oral Daily   mouth rinse  15 mL Mouth Rinse 4 times per day   pantoprazole  40 mg Oral Daily   rosuvastatin  10 mg Oral QAC supper   sodium chloride flush  3 mL Intravenous Q12H   Continuous Infusions:  sodium chloride 10 mL/hr at 09/10/21 0400   ceFEPime (MAXIPIME) IV Stopped (09/09/21 2147)   PRN Meds:.sodium chloride, acetaminophen **OR** acetaminophen, hydrALAZINE, mouth rinse, polyethylene glycol   I/O last 3 completed shifts: In: 595.4 [P.O.:435; I.V.:65.4; IV Piggyback:95] Out: 2000 [Urine:2000] No intake/output data recorded.    Radiology:   CT CHEST WO CONTRAST  Result Date: 09/09/2021 CLINICAL DATA:  Respiratory illness, nondiagnostic xray. E coli bacteremia. EXAM: CT CHEST WITHOUT CONTRAST TECHNIQUE: Multidetector CT imaging of the chest was performed following the standard protocol without IV contrast. RADIATION DOSE REDUCTION: This exam was performed according to the departmental  dose-optimization program which includes automated exposure control, adjustment of the mA and/or kV according to patient size and/or use of iterative reconstruction technique. COMPARISON:  09/04/2021 FINDINGS: Cardiovascular: Coronary artery bypass grafting has been performed. Cardiac size is mildly enlarged, stable since prior examination. No pericardial effusion. Central pulmonary arteries are enlarged in keeping with changes of pulmonary arterial hypertension. Extensive atherosclerotic calcification is seen within the thoracic aorta. No aortic aneurysm. Particularly prominent atherosclerotic calcification is seen within the proximal arch vasculature, particular at the origin of the left common carotid and left subclavian arteries, though the degree of stenosis is not well assessed on this noncontrast examination. Mediastinum/Nodes: 2.8 cm right thyroid nodule is unchanged in size and is better assessed on thyroid sonogram of 12/20/2018. No pathologic thoracic adenopathy. The esophagus is unremarkable. Lungs/Pleura: Multifocal, patchy ground-glass and consolidative pulmonary infiltrates are again identified demonstrating a slight mid and upper lung zone predominance, likely multifocal pneumonia. These are progressive since prior examination. No pneumothorax or pleural effusion. No central obstructing lesion. Upper Abdomen: Status post cholecystectomy. No acute abnormality. Extensive atherosclerotic calcification within the visualized abdominal aorta. Musculoskeletal: No acute bone abnormality. No lytic or blastic bone lesions are identified. IMPRESSION: 1. Progressive multifocal pulmonary infiltrates most in keeping with inflammatory changes such as progressive multifocal pneumonia or ARDS. 2. Stable mild cardiomegaly. 3. Morphologic changes in keeping with pulmonary arterial hypertension. 4. Peripheral vascular disease with prominent atherosclerotic calcification at the origin of the left common carotid and left  subclavian arteries, though the degree of stenosis is not well assessed on this noncontrast examination. If indicated, this could be better assessed with CT arteriography. 5. 2.8 cm right thyroid nodule, better assessed on thyroid sonogram of 12/20/2018. Aortic Atherosclerosis (ICD10-I70.0). Electronically Signed   By: Fidela Salisbury M.D.   On: 09/09/2021 17:48    Cardiac Studies:   Coronary angiogram 1996: [place] 80% stenosis ostial Left main, 30% stenosis proximal LAD, 80% stenosis proximal RCA CABG  -  LIMA to LAD, SVG to int, SVG to [place], [place].  Repeat catheterization recently has revealed widely patent RCA native and occluded SVG to RCA.  Echocardiogram 07/07/2021:   1. Left ventricular ejection fraction, by estimation, is 35 to 40%. The left ventricle has moderately decreased function. The left ventricle demonstrates regional wall motion abnormalities (see scoring diagram/findings for description). There is mild  left ventricular hypertrophy. Left ventricular diastolic parameters are indeterminate. Elevated left ventricular end-diastolic pressure. There is severe of the left ventricular, entire anterior wall. There is mild of the left ventricular, mid-apical  anterolateral wall.  2. Right ventricular systolic function is normal. The right ventricular size is normal. There is normal pulmonary artery systolic pressure. The estimated right ventricular systolic pressure is 03.1 mmHg.  3. Left atrial size was mildly dilated.  4. The mitral valve is normal in structure. Mild mitral valve regurgitation.  5. The aortic valve is tricuspid. Aortic valve regurgitation is mild to moderate. Aortic valve sclerosis/calcification is present, without any evidence of aortic stenosis.  6. The inferior vena cava is normal in size with <50% respiratory variability, suggesting right atrial pressure of 8 mmHg.   Comparison(s): Changes from prior study are noted. Compared to 08/23/2018, LVEF 60-65%  without regional wall motion abnormality. Mild MR and TR new.  Compared to 11/27/2019, LVEF is 50 to 55%.  There was no wall motion abnormality.  Otherwise no significant change.   Loop recorder [employer] Reveal LINQ  08/24/2018 Remote loop recorder transmission 08/05/2021: Small episodes of atrial fibrillation, AT/AF burden 6.6%.  No change from previous transmission.  EKG:  EKG 08/24/2021: Probably sinus rhythm with marked first-degree AV block at a rate of 56 bpm, right axis deviation, left posterior fascicular block.  Right bundle branch block.  Nonspecific T abnormality.  Trifascicular block.  Compared to 07/28/2021, no significant change.  Assessment   1.  Acute respiratory distress, extensive lung infiltrates noted on CT scan and chest x-ray, suspect could be related to amiodarone toxicity versus typical pneumonia, rapidity of onset suggest idiosyncratic reaction from prior amiodarone exposure.  Not suspect acute decompensated heart failure.  Patient manifesting ARDS. 2.  Coronary artery disease of the native vessel without angina pectoris, history of CABG and latest cardiac catheterization 07/12/2021 revealing no significant native vessel disease that is not bypassed. 3.  Paroxysmal atrial fibrillation CHA2DS2-VASc Score is 7.  Yearly risk of stroke: > 9.8% (F, A, HTN, DM, Vasc Dz).   Recommendations:   I personally discussed the findings and my clinical impressions with primary team Dr. Wynelle Cleveland and also with pulmonary critical care Dr. Sherrilyn Rist.  Her presentation is most consistent with ARDS.  Atypical pneumonia is a possibility.  Would not aggressively diurese any further.  Although she has chronic systolic and diastolic heart failure, she appears to be well compensated.  Do not suspect ACS.  I have personally met with patient's daughter and the patient, next 24 to 48 hours will determine whether she will turn around or may turn to worse.  Historically and interestingly on .   07/06/2021, patient was admitted with acute pulmonary edema with infiltrates in her lungs, I am beginning to wonder if this was the beginning of amiodarone toxicity.  She had responded to diuresis at that time, there was no follow-up chest x-ray.   Adrian Prows, MD, [facility] 09/10/2021, 8:08 AM Office: [phone]

## 2021-09-10 NOTE — Progress Notes (Addendum)
[facility] Progress Note  Patient: Linda Oliver     DUK:[id number]  DOA: 08/21/2021   PCP: Shon Baton, MD       Brief hospital course: This is an 88 year old female with CKD stage IIIb, atrial fibrillation, coronary artery disease status post stenting and CABG, diabetes mellitus type 2, OSA, obesity who presented to the hospital for hallucinations and weakness.  Per family delirium had been increasing for about a week. Her PCP had felt she may have a UTI and she was started on cephalexin the day before coming to the hospital. She was initially admitted for multifocal pneumonia and started on ceftriaxone and azithromycin.  She was later found to have E. coli bacteremia.  She was hospitalized from 6/27 through 07/13/21 for acute CHF, lobar pneumonia and an NSTEMI. She underwent a cath which reveals severe native vessel disease.   8/31- resp distress noted in afternoon- transferred to ICU- Lasix IV produced a good bit of urine but did not seem to help respiration- CT showing increasing pulmonary infiltrates- PCCM consulted  Subjective:  Quite somnolent on my exam today. Not able to participate much in conversation.  Assessment and Plan: Principal Problem:   Multifocal infiltrates Acute on Chronic hypoxic resp failure - ? Bacterial pneumonia, ? Amiodarone toxicity  - on further discussion with daughter, she was discharged home with 2 L oxygen on 7/4 with was new for her - also received antibiotics on previous admission and was discharged with Augmentin  - she has completed 5 days of azithromycin - Ceftriaxone was changed to Cefepime and solumedrol (which was being weaned down) was increased by PCCM on 8/31 - WBC down to 14.2 today from 18.8  - today is on 5 L of 02 with pulse ox of 88-92 %     Active Problems:   E. coli bacteremia   -  cont Cefepime  Acute encephalopathy- h/o dementia - related to acute infection when admitted but had improved considerably - she received  Ativan last night and remains somnolent today- appears confused as well in the brief moments that she was able to speak to me - dementia is mild with sundowning at home per family - Continue Aricept and Namenda - discharged home with Seroquel but family states they were hesitant to give this to her and have not been  Acute on chronic systolic heart failure - EF 35% - She has been receiving IV Lasix almost daily since she has been admitted - she appears adequately diuresed and Lasix is on hold  AKI on CKD stage 3b - baseline Cr in July was ~ 1.3- 1.5 - cr now 1.99   Hypokalemia - Has been replaced  Atrial fibrillation, h/o CVA in 2020 - Continue  metoprolol and Eliquis - discharged with Amio on 7/4- family state she was not longer receiving Amiodarone at home- not sure when this was stopped - has a loop recorder  Diabetes mellitus type 2 - Continue Semglee and sliding scale insulin- increasing Semglee today - cont Januvia (replaced by Tradjenta in hospital)  - hold Jardiance in setting of increased Creatinine - cont to hold Amaryl Hemoglobin A1C    Component Value Date/Time   HGBA1C 7.8 (H) 07/07/2021 0525    OSA - uses a CPAP at night  H/o b/l carotid artery stenosis - surveillance stopped due to age  Deconditioning - PT has recommended SNF- Family prefers [facility] with [facility] if possible as long as she can transfer from bed to chair  DVT prophylaxis:  SCDs Start: 08/13/2021 2332 apixaban (ELIQUIS) tablet 5 mg    Code Status: Full Code  Consultants: None Level of Care: Level of care: Stepdown Disposition Plan:  Status is: Inpatient Remains inpatient appropriate because: Ongoing treatment of dyspnea, infiltrates and hypoxia  Objective:   Vitals:   09/10/21 0401 09/10/21 0406 09/10/21 0500 09/10/21 0600  BP:  (!) 168/38 (!) 168/46 (!) 132/30  Pulse:  80 81 79  Resp:  '17 17 15  '$ Temp:      TempSrc:      SpO2:  96% 96% 95%  Weight: 68.3 kg     Height:       Filed  Weights   09/05/21 0500 09/09/21 0500 09/10/21 0401  Weight: 68.1 kg 71.3 kg 68.3 kg   Exam: General exam: Appears comfortable - very somnolent and confused HEENT: PERRLA, oral mucosa moist, no sclera icterus or thrush Respiratory system: b/l crackles   Cardiovascular system: S1 & S2 heard, regular rate and rhythm Gastrointestinal system: Abdomen soft, non-tender, nondistended. Normal bowel sounds   Extremities: No cyanosis, clubbing or edema Skin: No rashes or ulcers Psychiatry:  flat affect     Imaging and lab data was personally reviewed    CBC: Recent Labs  Lab 09/05/21 0728 09/06/21 0043 09/07/21 0431 09/08/21 0416 09/09/21 0806  WBC 18.7* 13.6* 15.5* 19.1* 18.8*  NEUTROABS  --  12.3* 14.0* 16.7*  --   HGB 8.6* 7.8* 8.0* 8.2* 7.9*  HCT 29.0* 26.9* 25.7* 28.0* 26.6*  MCV 87.1 88.5 83.7 86.2 87.2  PLT 359 305 334 366 403    Basic Metabolic Panel: Recent Labs  Lab 09/04/21 0000 09/04/21 0515 09/05/21 0728 09/06/21 0043 09/07/21 0431 09/08/21 0416  NA  --  142 144 141 138 140  K  --  4.0 3.0* 3.5 3.5 3.8  CL  --  108 104 103 102 99  CO2  --  '25 30 29 26 27  '$ GLUCOSE  --  58* 102* 234* 283* 255*  BUN  --  34* 34* 39* 53* 58*  CREATININE  --  2.05* 1.83* 1.82* 1.90* 1.99*  CALCIUM  --  8.5* 8.9 8.5* 8.9 9.2  MG 2.1  --  2.0 2.0 2.3 2.2  PHOS  --   --  3.6 3.0 3.9 3.8    GFR: Estimated Creatinine Clearance: 17.2 mL/min (A) (by C-G formula based on SCr of 1.99 mg/dL (H)).  Scheduled Meds:  apixaban  5 mg Oral BID   Chlorhexidine Gluconate Cloth  6 each Topical Daily   clopidogrel  75 mg Oral Daily   memantine  28 mg Oral QHS   And   donepezil  10 mg Oral QHS   DULoxetine  60 mg Oral Daily   ezetimibe  10 mg Oral Daily   gabapentin  300 mg Oral BID   insulin aspart  0-15 Units Subcutaneous TID WC   insulin glargine-yfgn  15 Units Subcutaneous Daily   isosorbide dinitrate  30 mg Oral BID   linagliptin  5 mg Oral Daily   methylPREDNISolone (SOLU-MEDROL)  injection  90 mg Intravenous Q12H   metoprolol succinate  12.5 mg Oral Daily   mirabegron ER  25 mg Oral Daily   mouth rinse  15 mL Mouth Rinse 4 times per day   pantoprazole  40 mg Oral Daily   rosuvastatin  10 mg Oral QAC supper   sodium chloride flush  3 mL Intravenous Q12H   Continuous Infusions:  sodium chloride  10 mL/hr at 09/10/21 0400   ceFEPime (MAXIPIME) IV Stopped (09/09/21 2147)     LOS: 7 days   Author: Debbe Odea  09/10/2021 7:31 AM

## 2021-09-10 NOTE — TOC Progression Note (Signed)
Transition of Care [facility]) - Progression Note    Patient Details  Name: Linda Oliver MRN: [id number] Date of Birth: 01-21-1932  Transition of Care [facility]) CM/SW Contact  Servando Snare, [place] Phone Number: 09/10/2021, 11:48 AM  Clinical Narrative:   TOC following for dc needs.     Expected Discharge Plan: [place] Barriers to Discharge: Continued Medical Work up  Expected Discharge Plan and Services Expected Discharge Plan: [place] In-house Referral: NA Discharge Planning Services: CM Consult Post Acute Care Choice: [place] arrangements for the past 2 months: Single Family Home                 DME Arranged: N/A         HH Arranged: Nurse's Aide HH Agency: [facility] Date [place]: 09/06/21 Time [place]: 1631 Representative spoke with at [place]: [place] Determinants of Health ([place]) Interventions    Readmission Risk Interventions     No data to display

## 2021-09-10 NOTE — Progress Notes (Signed)
NAME:  Linda Oliver, MRN:  [id number], DOB:  1932/08/08, LOS: 7 ADMISSION DATE:  09/07/2021, CONSULTATION DATE:  8/31 REFERRING MD:  Dr. Wynelle Cleveland, CHIEF COMPLAINT:  Hypoxia   History of Present Illness:  88 year old female with PMH as below, which is significant for DM, CAD s/p CABG, CVA, HTN, OSA on CPAP, PAF on eliquis, Seizure, and UTI. Most recently admitted back in June for CHF exacerbation and Atrial fibrillation. Improved with diuresis and cardioversion respectively. New tx at discharge included amiodarone and oxygen 2 liters. She presented to [facility] ED 8/25 with complaints of weakness and delirium progressive over a five day period. Poor PO intake and urinary output noted as well. Upon arrival to the ED she was noted to be febrile to 100.5 and have what appeared to be multifocal pneumonia on imaging. She was admitted to the hospitalists for CAP treatment including antibiotics and steroids. Blood cultures positive for E. Coli. She continued to improve with antibiotic therapy until 8/31, when she began to develop shortness of breath, which devolved into hypoxia and respiratory distress. She was transferred to ICU and supplemental oxygen was increased. CT chest consistent with multifocal infiltrates. PCCM consulted.   Pertinent  Medical History   has a past medical history of Anginal pain ([place]), Anxiety, Broken ankle, Bullous pemphigus, CAD (coronary artery disease), Depression, Diabetic peripheral neuropathy ([place]), Encounter for loop recorder check (04/11/2019), GERD (gastroesophageal reflux disease), H/O: stroke: Left occipital stroke and vision loss (08/24/2018), Headache(784.0), Heart murmur, Hyperlipidemia, Hypertension, Hypertensive heart disease, Loop recorder Reveal 08/24/2018 (04/11/2019), Nasal bleeding, OA (osteoarthritis), Obesity (BMI 30-39.9), Obstructive sleep apnea on CPAP, Osteopenia, Paroxysmal atrial fibrillation ([place]) (04/17/2019), Peripheral neuropathy, Seizure ([place]), Sleep apnea,  Stones in the urinary tract, Type 2 diabetes mellitus with vascular disease ([place]), and UTI (lower urinary tract infection).   Significant Hospital Events: Including procedures, antibiotic start and stop dates in addition to other pertinent events   8/25 admit for CAP 8/26 CT chest without contrast: Bilateral multifocal infiltrates 8/27 BC postive for E. Coli.  8/31 tx to ICU in respiratory distress >> bipap  Interim History / Subjective:  Placed on BiPAP overnight. Dropped to 5 L nasal cannula this morning but was lethargic with increased work of breathing and space back on BiPAP. Afebrile 2 L urine output Given Ativan at 8 PM  Objective   Blood pressure (!) 157/32, pulse 84, temperature 98.1 F (36.7 C), temperature source Oral, resp. rate 19, height 5' (1.524 m), weight 68.3 kg, SpO2 91 %.        Intake/Output Summary (Last 24 hours) at 09/10/2021 1002 Last data filed at 09/10/2021 0400 Gross per 24 hour  Intake 235.39 ml  Output 1800 ml  Net -1564.61 ml    Filed Weights   09/05/21 0500 09/09/21 0500 09/10/21 0401  Weight: 68.1 kg 71.3 kg 68.3 kg    Examination: General: Frail elderly appearing female on BiPAP, in no distress HENT: [place]/AT, PERRL, no JVD Lungs: No accessory muscle use, bilateral scattered crackles, no rhonchi Cardiovascular: RRR, no MRG Abdomen: Soft, non-tender, non-distended.  Extremities: No acute deformity. No edema.  Neuro: Wakes up, follows one-step commands, then goes back to sleep  Labs show normal electrolytes, decreasing creatinine, decreasing leukocytosis, stable anemia  CT chest without contrast 8/31 reviewed progressive multifocal infiltrates  Resolved Hospital Problem list     Assessment & Plan:   Hypoxic respiratory failure Multifocal infiltrates: differential is broad. Infectious vs inflammatory. Has been treated for CAP x 5 days.  Respiratory viral panel negative. -Chest x-ray in June shows left upper lobe airspace disease  -  recently been restarted on amiodarone. Amiodarone toxicity? Seems to have worsened  without steroids.  -Use BiPAP as needed for work of breathing - Supplemental O2 to keep sats > 92% - Escalate ABX to cefepime, since she worsened after 5 days of ceftriaxone -Change Solu-Medrol 60 every 8 - Hold amiodarone  AF Chronic HFrEF - hold amiodarone if feasible  - rest per primary/cardiology who have been consulted.   Acute metabolic encephalopathy Dementia -Continue Aricept and Namenda -Avoid CNS depressing medications  OSA on CPAP - BiPAP mandatory nightly  DM - per primary  Best Practice (right click and "Reselect all SmartList Selections" daily)   Diet/type: NPO DVT prophylaxis: DOAC GI prophylaxis: N/A Lines: N/A Foley:  N/A Code Status:  full code Last date of multidisciplinary goals of care discussion '[ ]'$   Labs   CBC: Recent Labs  Lab 09/06/21 0043 09/07/21 0431 09/08/21 0416 09/09/21 0806 09/10/21 0751  WBC 13.6* 15.5* 19.1* 18.8* 14.2*  NEUTROABS 12.3* 14.0* 16.7*  --   --   HGB 7.8* 8.0* 8.2* 7.9* 7.8*  HCT 26.9* 25.7* 28.0* 26.6* 26.1*  MCV 88.5 83.7 86.2 87.2 85.6  PLT 305 334 366 321 319     Basic Metabolic Panel: Recent Labs  Lab 09/04/21 0000 09/04/21 0515 09/05/21 0728 09/06/21 0043 09/07/21 0431 09/08/21 0416 09/10/21 0751  NA  --    < > 144 141 138 140 141  K  --    < > 3.0* 3.5 3.5 3.8 4.3  CL  --    < > 104 103 102 99 103  CO2  --    < > '30 29 26 27 25  '$ GLUCOSE  --    < > 102* 234* 283* 255* 216*  BUN  --    < > 34* 39* 53* 58* 55*  CREATININE  --    < > 1.83* 1.82* 1.90* 1.99* 1.68*  CALCIUM  --    < > 8.9 8.5* 8.9 9.2 8.3*  MG 2.1  --  2.0 2.0 2.3 2.2  --   PHOS  --   --  3.6 3.0 3.9 3.8  --    < > = values in this interval not displayed.    GFR: Estimated Creatinine Clearance: 20.3 mL/min (A) (by C-G formula based on SCr of 1.68 mg/dL (H)). Recent Labs  Lab 08/15/2021 1435 09/08/2021 1636 08/10/2021 1930 09/04/21 0000  09/07/21 0431 09/08/21 0416 09/09/21 0806 09/10/21 0751  WBC  --   --   --    < > 15.5* 19.1* 18.8* 14.2*  LATICACIDVEN 1.4 2.3* 2.2*  --   --   --   --   --    < > = values in this interval not displayed.     Liver Function Tests: Recent Labs  Lab 09/04/21 0515 09/05/21 0728 09/06/21 0043 09/07/21 0431 09/08/21 0416  AST 124* 93* 53* 43* 40  ALT 60* 68* 53* 47* 47*  ALKPHOS 108 127* 125 141* 146*  BILITOT 0.9 1.1 1.0 0.8 0.9  PROT 6.2* 6.3* 5.9* 6.2* 6.4*  ALBUMIN 2.6* 2.5* 2.3* 2.3* 2.3*    No results for input(s): "LIPASE", "AMYLASE" in the last 168 hours. No results for input(s): "AMMONIA" in the last 168 hours.  ABG    Component Value Date/Time   PHART 7.43 09/09/2021 1549   PCO2ART 47 09/09/2021 1549   PO2ART 69 (L) 09/09/2021  1549   HCO3 31.2 (H) 09/09/2021 1549   TCO2 37 (H) 07/12/2021 1259   O2SAT 96.2 09/09/2021 1549     Coagulation Profile: Recent Labs  Lab 08/15/2021 1250  INR 3.5*     Cardiac Enzymes: No results for input(s): "CKTOTAL", "CKMB", "CKMBINDEX", "TROPONINI" in the last 168 hours.  HbA1C: Hgb A1c MFr Bld  Date/Time Value Ref Range Status  07/07/2021 05:25 AM 7.8 (H) 4.8 - 5.6 % Final    Comment:    (NOTE) Pre diabetes:          5.7%-6.4%  Diabetes:              >6.4%  Glycemic control for   <7.0% adults with diabetes   08/23/2018 04:34 AM 8.7 (H) 4.8 - 5.6 % Final    Comment:    (NOTE) Pre diabetes:          5.7%-6.4% Diabetes:              >6.4% Glycemic control for   <7.0% adults with diabetes     CBG: Recent Labs  Lab 09/09/21 0753 09/09/21 1132 09/09/21 1751 09/09/21 2216 09/10/21 0742  GLUCAP 250* 262* 161* 116* 214*      Kara Mead MD. FCCP.  Pulmonary & Critical care Pager : [phone]  If no response to pager , please call [phone] until 7 pm After 7:00 pm call Elink  [phone]    09/10/2021 10:02 AM

## 2021-09-10 NOTE — Progress Notes (Signed)
PT Cancellation Note  Patient Details Name: Linda Oliver MRN: [id number] DOB: [birth year]   Cancelled Treatment:    Reason Eval/Treat Not Completed: Medical issues which prohibited therapy, transfer to ICU with worsening respiratory status. [place] Office [phone] Weekend [phone]    [name] 09/10/2021, 7:24 AM

## 2021-09-10 DEATH — deceased

## 2021-09-11 ENCOUNTER — Inpatient Hospital Stay (HOSPITAL_COMMUNITY): Payer: Medicare Other

## 2021-09-11 DIAGNOSIS — J189 Pneumonia, unspecified organism: Secondary | ICD-10-CM | POA: Diagnosis not present

## 2021-09-11 DIAGNOSIS — J9601 Acute respiratory failure with hypoxia: Secondary | ICD-10-CM | POA: Diagnosis not present

## 2021-09-11 LAB — CBC
HCT: 25.6 % — ABNORMAL LOW (ref 36.0–46.0)
Hemoglobin: 7.4 g/dL — ABNORMAL LOW (ref 12.0–15.0)
MCH: 25.7 pg — ABNORMAL LOW (ref 26.0–34.0)
MCHC: 28.9 g/dL — ABNORMAL LOW (ref 30.0–36.0)
MCV: 88.9 fL (ref 80.0–100.0)
Platelets: 359 10*3/uL (ref 150–400)
RBC: 2.88 MIL/uL — ABNORMAL LOW (ref 3.87–5.11)
RDW: 17.7 % — ABNORMAL HIGH (ref 11.5–15.5)
WBC: 18.1 10*3/uL — ABNORMAL HIGH (ref 4.0–10.5)
nRBC: 0.3 % — ABNORMAL HIGH (ref 0.0–0.2)

## 2021-09-11 LAB — GLUCOSE, CAPILLARY
Glucose-Capillary: 297 mg/dL — ABNORMAL HIGH (ref 70–99)
Glucose-Capillary: 221 mg/dL — ABNORMAL HIGH (ref 70–99)
Glucose-Capillary: 159 mg/dL — ABNORMAL HIGH (ref 70–99)
Glucose-Capillary: 154 mg/dL — ABNORMAL HIGH (ref 70–99)
Glucose-Capillary: 146 mg/dL — ABNORMAL HIGH (ref 70–99)

## 2021-09-11 LAB — BASIC METABOLIC PANEL
Anion gap: 11 (ref 5–15)
BUN: 62 mg/dL — ABNORMAL HIGH (ref 8–23)
CO2: 26 mmol/L (ref 22–32)
Calcium: 8.5 mg/dL — ABNORMAL LOW (ref 8.9–10.3)
Chloride: 109 mmol/L (ref 98–111)
Creatinine, Ser: 1.79 mg/dL — ABNORMAL HIGH (ref 0.44–1.00)
GFR, Estimated: 27 mL/min — ABNORMAL LOW (ref >60)
Glucose, Bld: 234 mg/dL — ABNORMAL HIGH (ref 70–99)
Potassium: 3.8 mmol/L (ref 3.5–5.1)
Sodium: 146 mmol/L — ABNORMAL HIGH (ref 135–145)

## 2021-09-11 LAB — CULTURE, BLOOD (ROUTINE X 2)
Culture: NO GROWTH
Culture: NO GROWTH
Special Requests: ADEQUATE
Special Requests: ADEQUATE

## 2021-09-11 NOTE — Progress Notes (Addendum)
NAME:  Linda Oliver, MRN:  [id number], DOB:  [birth year], LOS: 8 ADMISSION DATE:  09/02/2021, CONSULTATION DATE:  8/31 REFERRING MD:  Dr. Wynelle Cleveland, CHIEF COMPLAINT:  Hypoxia   History of Present Illness:  88 year old female with PMH as below, which is significant for  Most recently admitted back in June for CHF exacerbation and Atrial fibrillation. Improved with diuresis and cardioversion respectively. New tx at discharge included amiodarone and oxygen 2 liters. She presented to [facility] ED 8/25 with complaints of weakness and delirium progressive over a five day period. Poor PO intake and urinary output noted as well. Upon arrival to the ED she was noted to be febrile to 100.5 and have what appeared to be multifocal pneumonia on imaging. She was admitted to the hospitalists for CAP treatment including antibiotics and steroids. Blood cultures positive for E. Coli. She continued to improve with antibiotic therapy until 8/31, when she began to develop shortness of breath, which devolved into hypoxia and respiratory distress. She was transferred to ICU and supplemental oxygen was increased. CT chest consistent with multifocal infiltrates. PCCM consulted.   Pertinent  Medical History  DM,  CAD s/p CABG,  CVA with medial left occipital infarct in embolic pattern by MRI in 2020 and loop recorder implanted showed paroxysmal atrial fibrillation.   OSA on CPAP,  PAF on eliquis,  Seizure, and UTI. stage 3a CKD    Significant Hospital Events: Including procedures, antibiotic start and stop dates in addition to other pertinent events   8/25 admit for CAP 8/26 CT chest without contrast: Bilateral multifocal infiltrates 8/27 BC postive for E. Coli.  8/31 tx to ICU in respiratory distress >> bipap 9/1Given Ativan at 8 PM -slept all day, on BiPAP  Interim History / Subjective:   Off BiPAP this morning but dropped saturation on 3 L nasal cannula and was placed on  nonrebreather Afebrile Hypertensive   Objective   Blood pressure (!) 173/33, pulse 78, temperature (!) 97.5 F (36.4 C), temperature source Oral, resp. rate 15, height 5' (1.524 m), weight 68.3 kg, SpO2 92 %.    FiO2 (%):  [40 %] 40 %   Intake/Output Summary (Last 24 hours) at 09/11/2021 0920 Last data filed at 09/11/2021 0800 Gross per 24 hour  Intake 684.78 ml  Output 800 ml  Net -115.22 ml    Filed Weights   09/05/21 0500 09/09/21 0500 09/10/21 0401  Weight: 68.1 kg 71.3 kg 68.3 kg    Examination: General: Frail elderly appearing female on NRB, in no distress HENT: [place]/AT, PERRL, no JVD Lungs: No accessory muscle use, bilateral scattered crackles, no rhonchi Cardiovascular: [place] S2 regular, no murmur Abdomen: Soft, non-tender, non-distended.  Extremities: No acute deformity. No edema.  Neuro: More awake, interactive, nonfocal  Labs show n mild hypernatremia , increasing BUN/creatinine, increase leukocytosis, stable anemia Chest x-ray 9/2 independently reviewed shows worsening bilateral multifocal infiltrates  CT chest without contrast 8/31 reviewed progressive multifocal infiltrates  Resolved Hospital Problem list     Assessment & Plan:   Acute hypoxic respiratory failure Chronic respiratory failure -was discharged 7/4 on 2 L of oxygen Multifocal infiltrates: differential is broad. Infectious vs inflammatory. Has been treated for CAP x 5 days.  Respiratory viral panel negative. -Chest x-ray in June shows left upper lobe airspace disease was developing - Amiodarone toxicity?  On amiodarone since June -Use BiPAP as needed for work of breathing - Supplemental O2 to keep sats > 92% - Escalated ABX to cefepime, since  she worsened after 5 days of ceftriaxone , completed 5 days of azithromycin -Continue Solu-Medrol 60 every 8 - Hold amiodarone -Swallow evaluation rule out aspiration  PAF Chronic HFrEF - hold amiodarone if feasible  - rest per primary/cardiology who  have been consulted.   Acute metabolic encephalopathy Dementia History of occipital CVA -Continue Aricept and Namenda -Avoid CNS depressing medications , somnolence on 9/1 related to Ativan  OSA on CPAP - BiPAP mandatory nightly  DM - per primary  Best Practice (right click and "Reselect all SmartList Selections" daily)   Diet/type: NPO advance diet today DVT prophylaxis: DOAC GI prophylaxis: N/A Lines: N/A Foley:  N/A Code Status:  full code Last date of multidisciplinary goals of care discussion '[ ]'$  updated son-in-law at bedside  Labs   CBC: Recent Labs  Lab 09/06/21 0043 09/07/21 0431 09/08/21 0416 09/09/21 0806 09/10/21 0751 09/11/21 0259  WBC 13.6* 15.5* 19.1* 18.8* 14.2* 18.1*  NEUTROABS 12.3* 14.0* 16.7*  --   --   --   HGB 7.8* 8.0* 8.2* 7.9* 7.8* 7.4*  HCT 26.9* 25.7* 28.0* 26.6* 26.1* 25.6*  MCV 88.5 83.7 86.2 87.2 85.6 88.9  PLT 305 334 366 321 319 359     Basic Metabolic Panel: Recent Labs  Lab 09/05/21 0728 09/06/21 0043 09/07/21 0431 09/08/21 0416 09/10/21 0751 09/11/21 0259  NA 144 141 138 140 141 146*  K 3.0* 3.5 3.5 3.8 4.3 3.8  CL 104 103 102 99 103 109  CO2 '30 29 26 27 25 26  '$ GLUCOSE 102* 234* 283* 255* 216* 234*  BUN 34* 39* 53* 58* 55* 62*  CREATININE 1.83* 1.82* 1.90* 1.99* 1.68* 1.79*  CALCIUM 8.9 8.5* 8.9 9.2 8.3* 8.5*  MG 2.0 2.0 2.3 2.2  --   --   PHOS 3.6 3.0 3.9 3.8  --   --     GFR: Estimated Creatinine Clearance: 19.1 mL/min (A) (by C-G formula based on SCr of 1.79 mg/dL (H)). Recent Labs  Lab 09/08/21 0416 09/09/21 0806 09/10/21 0751 09/11/21 0259  WBC 19.1* 18.8* 14.2* 18.1*     Liver Function Tests: Recent Labs  Lab 09/05/21 0728 09/06/21 0043 09/07/21 0431 09/08/21 0416  AST 93* 53* 43* 40  ALT 68* 53* 47* 47*  ALKPHOS 127* 125 141* 146*  BILITOT 1.1 1.0 0.8 0.9  PROT 6.3* 5.9* 6.2* 6.4*  ALBUMIN 2.5* 2.3* 2.3* 2.3*    No results for input(s): "LIPASE", "AMYLASE" in the last 168 hours. No  results for input(s): "AMMONIA" in the last 168 hours.  ABG    Component Value Date/Time   PHART 7.43 09/09/2021 1549   PCO2ART 47 09/09/2021 1549   PO2ART 69 (L) 09/09/2021 1549   HCO3 31.2 (H) 09/09/2021 1549   TCO2 37 (H) 07/12/2021 1259   O2SAT 96.2 09/09/2021 1549     Coagulation Profile: No results for input(s): "INR", "PROTIME" in the last 168 hours.   Cardiac Enzymes: No results for input(s): "CKTOTAL", "CKMB", "CKMBINDEX", "TROPONINI" in the last 168 hours.  HbA1C: Hgb A1c MFr Bld  Date/Time Value Ref Range Status  07/07/2021 05:25 AM 7.8 (H) 4.8 - 5.6 % Final    Comment:    (NOTE) Pre diabetes:          5.7%-6.4%  Diabetes:              >6.4%  Glycemic control for   <7.0% adults with diabetes   08/23/2018 04:34 AM 8.7 (H) 4.8 - 5.6 % Final  Comment:    (NOTE) Pre diabetes:          5.7%-6.4% Diabetes:              >6.4% Glycemic control for   <7.0% adults with diabetes     CBG: Recent Labs  Lab 09/10/21 0742 09/10/21 1153 09/10/21 1632 09/10/21 2024 09/11/21 0801  GLUCAP 214* 178* 145* 177* 297*      Kara Mead MD. FCCP. [place] Pulmonary & Critical [facility] Pager : [phone]  If no response to pager , please call [phone] until 7 pm After 7:00 pm call Elink  [phone]    09/11/2021 9:20 AM

## 2021-09-11 NOTE — Evaluation (Signed)
Clinical/Bedside Swallow Evaluation Patient Details  Name: Linda Oliver MRN: [id number] Date of Birth: [birth year]  Today's Date: 09/11/2021 Time: SLP Start Time (ACUTE ONLY): 74 SLP Stop Time (ACUTE ONLY): 40 SLP Time Calculation (min) (ACUTE ONLY): 15 min  Past Medical History:  Past Medical History:  Diagnosis Date   Anginal pain ([place])    occ   Anxiety    Broken ankle    Bullous pemphigus    CAD (coronary artery disease)    [place]   80% stenosis ostial Left main, 30% stenosis proximal LAD, 80% stenosis proximal RCA CABG  -  LIMA to LAD, SVG to int, SVG to [place], [place]    Depression    Diabetic peripheral neuropathy ([place])    Encounter for loop recorder check 04/11/2019   GERD (gastroesophageal reflux disease)    H/O: stroke: Left occipital stroke and vision loss 08/24/2018   Headache(784.0)    hx migraines   Heart murmur    mvp   Hyperlipidemia    Hypertension    Hypertensive heart disease    Loop recorder Reveal 08/24/2018 04/11/2019   Scheduled Remote loop recorder check 03/10/2019: The presenting rhythm is sinus bradycardia. There were 0 symptomatic patient activations recorded. There were 275 AF episodes and 0 AT episodes recorded, representing a 4.2 % cumulative AT/AF burden. The longest episode lasted 00:03:40:00 in duration. Some false with PAC. There were 0 tachy episodes detected. There were 0 pause episodes detected. Th   Nasal bleeding    occ due to O2   OA (osteoarthritis)    Obesity (BMI 30-39.9)    Obstructive sleep apnea on CPAP    Osteopenia    Paroxysmal atrial fibrillation ([place]) 04/17/2019   Peripheral neuropathy    Seizure ([place])    2009   Sleep apnea    dx 81yr ago doesnt use cpap but uses O2 at nite 2L   Stones in the urinary tract    Type 2 diabetes mellitus with vascular disease ([place]    UTI (lower urinary tract infection)    Past Surgical History:  Past Surgical History:  Procedure Laterality Date   ABDOMINAL  HYSTERECTOMY  70's   CARDIAC CATHETERIZATION     CARDIOVERSION N/A 07/08/2021   Procedure: CARDIOVERSION;  Surgeon: GAdrian Prows MD;  [facility]  Service: Cardiovascular;  Laterality: N/A;   CARPAL TUNNEL RELEASE Bilateral    CATARACT EXTRACTION W/PHACO Bilateral    CHOLECYSTECTOMY     CORONARY ANGIOPLASTY WITH STENT PLACEMENT  2023   CORONARY ARTERY BYPASS GRAFT  1996   EYE SURGERY Left    LOOP RECORDER INSERTION N/A 08/24/2018   Procedure: LOOP RECORDER INSERTION;  Surgeon: CConstance Haw MD;  [facility];  Service: Cardiovascular;  Laterality: N/A;   RIGHT/LEFT HEART CATH AND CORONARY/GRAFT ANGIOGRAPHY N/A 07/12/2021   Procedure: RIGHT/LEFT HEART CATH AND CORONARY/GRAFT ANGIOGRAPHY;  Surgeon: PNigel Mormon MD;  [facility];  Service: Cardiovascular;  Laterality: N/A;   SHOULDER ARTHROSCOPY     rt   SHOULDER ARTHROSCOPY WITH ROTATOR CUFF REPAIR AND SUBACROMIAL DECOMPRESSION  12/01/2011   Procedure: SHOULDER ARTHROSCOPY WITH ROTATOR CUFF REPAIR AND SUBACROMIAL DECOMPRESSION;  Surgeon: JNita Sells MD;  [facility]  Service: Orthopedics;  Laterality: Left;  shoulder debridement calcific tendonitis   HPI:  This is an 88year old female who presented to the hospital for hallucinations and weakness.  Per family delirium had been increasing for about a  week. Her PCP had felt she may have a UTI and she was started on cephalexin the day before coming to the hospital.  She was initially admitted for multifocal pneumonia and started on ceftriaxone and azithromycin.  She was later found to have E. coli bacteremia. Pulmonology consulted ordered SLP b/c aspiration part of differential dx.    Assessment / Plan / Recommendation  Clinical Impression  Pt awakened from sleep, remained lethargic throughout assessment, eyes drooping, pt generally very weak. Pt hypophonic but requesting ice. When given ice fully upright pt with poor  labial seal, anterior spillage, prolonged oral manipulation and throat clearing. Attempted a bite of puree as pt has been taking meds crushed in puree. She still had prolonged oral manipulation but no signs of aspiration after two swallows. Recommend NPO at this time. If alert pt can be given meds crushed in puree. Will f/u tomorrow to reassess for PO readiness if arousal has improved. May need to consider instrumental assessment when alert and stronger, given pulmonology concerns about aspiration pna as primary problem. SLP Visit Diagnosis: Dysphagia, unspecified (R13.10)    Aspiration Risk  Severe aspiration risk;Risk for inadequate nutrition/hydration    Diet Recommendation NPO;NPO except meds   Medication Administration: Crushed with puree Postural Changes: Seated upright at 90 degrees    Other  Recommendations      Recommendations for follow up therapy are one component of a multi-disciplinary discharge planning process, led by the attending physician.  Recommendations may be updated based on patient status, additional functional criteria and insurance authorization.  Follow up Recommendations Skilled nursing-short term rehab (<3 hours/day)      Assistance Recommended at Discharge Frequent or constant Supervision/Assistance  Functional Status Assessment Patient has had a recent decline in their functional status and demonstrates the ability to make significant improvements in function in a reasonable and predictable amount of time.  Frequency and Duration min 2x/week  2 weeks       Prognosis Prognosis for Safe Diet Advancement: Fair Barriers to Reach Goals: Severity of deficits      Swallow Study   General HPI: This is an 88 year old female who presented to the hospital for hallucinations and weakness.  Per family delirium had been increasing for about a week. Her PCP had felt she may have a UTI and she was started on cephalexin the day before coming to the hospital.  She was  initially admitted for multifocal pneumonia and started on ceftriaxone and azithromycin.  She was later found to have E. coli bacteremia. Pulmonology consulted ordered SLP b/c aspiration part of differential dx. Type of Study: Bedside Swallow Evaluation Previous Swallow Assessment: none Diet Prior to this Study: NPO Temperature Spikes Noted: No Respiratory Status: Nasal cannula (HHFNC 15L 70%) History of Recent Intubation: No Behavior/Cognition: Lethargic/Drowsy Oral Cavity Assessment: Dry Oral Care Completed by SLP: No Oral Cavity - Dentition: Missing dentition Vision: Impaired for self-feeding Self-Feeding Abilities: Total assist Patient Positioning: Upright in bed Baseline Vocal Quality: Low vocal intensity Volitional Cough: Weak Volitional Swallow: Unable to elicit    Oral/Motor/Sensory Function Overall Oral Motor/Sensory Function: Generalized oral weakness   Ice Chips Ice chips: Impaired Presentation: Spoon Oral Phase Impairments: Poor awareness of bolus;Reduced labial seal Oral Phase Functional Implications: Right anterior spillage;Left anterior spillage;Oral residue;Prolonged oral transit Pharyngeal Phase Impairments: Throat Clearing - Immediate   Thin Liquid Thin Liquid: Not tested    Nectar Thick Nectar Thick Liquid: Not tested   Honey Thick Honey Thick Liquid: Not tested   Puree Puree:  Impaired Presentation: Spoon Oral Phase Impairments: Reduced labial seal Oral Phase Functional Implications: Prolonged oral transit   Solid     Solid: Not tested      Lynann Beaver 09/11/2021,4:19 PM

## 2021-09-11 NOTE — Progress Notes (Signed)
[facility] Progress Note  Patient: Linda Oliver     JJK:[id number]  DOA: 08/25/2021   PCP: Shon Baton, MD       Brief hospital course: This is an 88 year old female with CKD stage IIIb, atrial fibrillation, coronary artery disease status post stenting and CABG, diabetes mellitus type 2, OSA, obesity who presented to the hospital for hallucinations and weakness.  Per family delirium had been increasing for about a week. Her PCP had felt she may have a UTI and she was started on cephalexin the day before coming to the hospital. She was initially admitted for multifocal pneumonia and started on ceftriaxone and azithromycin.  She was later found to have E. coli bacteremia.  She was hospitalized from 6/27 through 07/13/21 for acute CHF, lobar pneumonia and an NSTEMI. She underwent a cath which reveals severe native vessel disease.   8/31- resp distress noted in afternoon- transferred to ICU- Lasix IV produced a good bit of urine but did not seem to help respiration- CT showing increasing pulmonary infiltrates- PCCM consulted  9/1-quite sleepy throughout the day after receiving a small dose of Ativan the night before  Subjective:  Awake and alert today.  She recalls being excessively sleepy yesterday.  She feels that her breathing has improved overall.  She has no other complaints  Assessment and Plan: Principal Problem:   Multifocal infiltrates Acute on Chronic hypoxic resp failure - ? Bacterial pneumonia, ? Amiodarone toxicity ? ARDS  - she was discharged home with 2 L oxygen on 7/4 with was new for her - also received antibiotics on previous admission and was discharged with Augmentin  - she has completed 5 days of azithromycin - Ceftriaxone was changed to Cefepime and solumedrol (which was being weaned down) was increased by PCCM on 8/31 -On a nonrebreather mask today     Active Problems:   E. coli bacteremia   -  cont Cefepime for now- today is day 7 of treatment  Acute  encephalopathy- h/o dementia - related to acute infection when admitted but had improved considerably - she received Ativan overnight and developed severe sedation - dementia is mild with sundowning at home per family - Continue Aricept and Namenda - discharged home with Seroquel but family states they were hesitant to give this to her and have not been  Acute on chronic systolic heart failure - EF 35% - she appears adequately diuresed and Lasix is on hold -She has significant crackles in both lungs due to a primary pulmonary pathology and on exam it is difficult to determine her fluid status - will reassess daily to decide on resumption  AKI on CKD stage 3b - baseline Cr in July was ~ 1.3- 1.5   Hypokalemia - Has been replaced  Coronary artery disease -She is receiving Plavix in addition to Eliquis - Continue Isordil, Zetia and Crestor  Paroxysmal atrial fibrillation, h/o CVA in 2020 - Continue metoprolol and Eliquis - discharged with Amio on 7/4- family state she was not longer receiving Amiodarone at home- not sure when this was stopped - has a loop recorder  Diabetes mellitus type 2 - Continue Semglee and sliding scale insulin- - Semglee increased on 9/1-follow sugars - cont Januvia (replaced by Tradjenta in hospital)  - hold Jardiance in setting of increased Creatinine - cont to hold Amaryl Hemoglobin A1C    Component Value Date/Time   HGBA1C 7.8 (H) 07/07/2021 0525    OSA - uses a CPAP at night  H/o b/l carotid  artery stenosis - surveillance stopped due to age  50 - PT has recommended SNF- Family prefers home with [facility] if possible as long as she can transfer from bed to chair     DVT prophylaxis:  apixaban (ELIQUIS) tablet 2.5 mg Start: 09/10/21 2200 SCDs Start: 08/23/2021 2332 apixaban (ELIQUIS) tablet 2.5 mg    Code Status: Full Code  Consultants: None Level of Care: Level of care: Stepdown Disposition Plan:  Status is: Inpatient Remains  inpatient appropriate because: Ongoing treatment of dyspnea, infiltrates and hypoxia  Objective:   Vitals:   09/11/21 0817 09/11/21 0820 09/11/21 1000 09/11/21 1102  BP:   (!) 178/51   Pulse:   80   Resp:   (!) 21   Temp:  (!) 97.5 F (36.4 C)    TempSrc:  Oral    SpO2: 92%  93% 96%  Weight:      Height:       Filed Weights   09/05/21 0500 09/09/21 0500 09/10/21 0401  Weight: 68.1 kg 71.3 kg 68.3 kg   Exam: General exam: Appears comfortable  HEENT: PERRLA, oral mucosa moist, no sclera icterus or thrush Respiratory system: Crackles throughout lungs bilaterally-increased respiratory rate Cardiovascular system: S1 & S2 heard, regular rate and rhythm Gastrointestinal system: Abdomen soft, non-tender, nondistended. Normal bowel sounds   Central nervous system: Alert and oriented. No focal neurological deficits. Extremities: No cyanosis, clubbing or edema Skin: No rashes or ulcers Psychiatry:  Mood & affect appropriate.    Imaging and lab data was personally reviewed    CBC: Recent Labs  Lab 09/06/21 0043 09/07/21 0431 09/08/21 0416 09/09/21 0806 09/10/21 0751 09/11/21 0259  WBC 13.6* 15.5* 19.1* 18.8* 14.2* 18.1*  NEUTROABS 12.3* 14.0* 16.7*  --   --   --   HGB 7.8* 8.0* 8.2* 7.9* 7.8* 7.4*  HCT 26.9* 25.7* 28.0* 26.6* 26.1* 25.6*  MCV 88.5 83.7 86.2 87.2 85.6 88.9  PLT 305 334 366 321 319 989    Basic Metabolic Panel: Recent Labs  Lab 09/05/21 0728 09/06/21 0043 09/07/21 0431 09/08/21 0416 09/10/21 0751 09/11/21 0259  NA 144 141 138 140 141 146*  K 3.0* 3.5 3.5 3.8 4.3 3.8  CL 104 103 102 99 103 109  CO2 '30 29 26 27 25 26  '$ GLUCOSE 102* 234* 283* 255* 216* 234*  BUN 34* 39* 53* 58* 55* 62*  CREATININE 1.83* 1.82* 1.90* 1.99* 1.68* 1.79*  CALCIUM 8.9 8.5* 8.9 9.2 8.3* 8.5*  MG 2.0 2.0 2.3 2.2  --   --   PHOS 3.6 3.0 3.9 3.8  --   --     GFR: Estimated Creatinine Clearance: 19.1 mL/min (A) (by C-G formula based on SCr of 1.79 mg/dL (H)).  Scheduled  Meds:  apixaban  2.5 mg Oral BID   Chlorhexidine Gluconate Cloth  6 each Topical Daily   clopidogrel  75 mg Oral Daily   memantine  28 mg Oral QHS   And   donepezil  10 mg Oral QHS   DULoxetine  60 mg Oral Daily   ezetimibe  10 mg Oral Daily   gabapentin  300 mg Oral BID   insulin aspart  0-15 Units Subcutaneous TID WC   insulin glargine-yfgn  20 Units Subcutaneous Daily   isosorbide dinitrate  30 mg Oral BID   linagliptin  5 mg Oral Daily   methylPREDNISolone (SOLU-MEDROL) injection  60 mg Intravenous Q8H   metoprolol succinate  12.5 mg Oral Daily   mirabegron ER  25 mg Oral Daily   mouth rinse  15 mL Mouth Rinse 4 times per day   pantoprazole  40 mg Oral Daily   rosuvastatin  10 mg Oral QAC supper   sodium chloride flush  3 mL Intravenous Q12H   Continuous Infusions:  sodium chloride 10 mL/hr at 09/11/21 1000   ceFEPime (MAXIPIME) IV Stopped (09/10/21 1710)     LOS: 8 days   Author: Debbe Odea  09/11/2021 11:52 AM

## 2021-09-11 NOTE — Progress Notes (Signed)
Pt placed on Heated high flow [place]. Sats 96% on 15L, 70%.

## 2021-09-12 DIAGNOSIS — J189 Pneumonia, unspecified organism: Secondary | ICD-10-CM | POA: Diagnosis not present

## 2021-09-12 DIAGNOSIS — I5042 Chronic combined systolic (congestive) and diastolic (congestive) heart failure: Secondary | ICD-10-CM

## 2021-09-12 DIAGNOSIS — J80 Acute respiratory distress syndrome: Secondary | ICD-10-CM

## 2021-09-12 DIAGNOSIS — J9601 Acute respiratory failure with hypoxia: Secondary | ICD-10-CM | POA: Diagnosis not present

## 2021-09-12 LAB — BASIC METABOLIC PANEL
Anion gap: 7 (ref 5–15)
BUN: 73 mg/dL — ABNORMAL HIGH (ref 8–23)
CO2: 28 mmol/L (ref 22–32)
Calcium: 8.2 mg/dL — ABNORMAL LOW (ref 8.9–10.3)
Chloride: 108 mmol/L (ref 98–111)
Creatinine, Ser: 1.9 mg/dL — ABNORMAL HIGH (ref 0.44–1.00)
GFR, Estimated: 25 mL/min — ABNORMAL LOW (ref >60)
Glucose, Bld: 217 mg/dL — ABNORMAL HIGH (ref 70–99)
Potassium: 3.8 mmol/L (ref 3.5–5.1)
Sodium: 143 mmol/L (ref 135–145)

## 2021-09-12 LAB — GLUCOSE, CAPILLARY
Glucose-Capillary: 249 mg/dL — ABNORMAL HIGH (ref 70–99)
Glucose-Capillary: 295 mg/dL — ABNORMAL HIGH (ref 70–99)
Glucose-Capillary: 337 mg/dL — ABNORMAL HIGH (ref 70–99)

## 2021-09-12 MED ORDER — FUROSEMIDE 40 MG PO TABS
40.0000 mg | ORAL_TABLET | Freq: Every day | ORAL | Status: DC
  Administered 2021-09-12 – 2021-09-13 (×2): 40 mg via ORAL
  Filled 2021-09-12 (×2): qty 1

## 2021-09-12 MED ORDER — EMPAGLIFLOZIN 10 MG PO TABS
10.0000 mg | ORAL_TABLET | Freq: Every day | ORAL | Status: DC
  Administered 2021-09-12 – 2021-09-13 (×2): 10 mg via ORAL
  Filled 2021-09-12 (×2): qty 1

## 2021-09-12 MED ORDER — POTASSIUM CHLORIDE CRYS ER 10 MEQ PO TBCR
10.0000 meq | EXTENDED_RELEASE_TABLET | Freq: Once | ORAL | Status: AC
Start: 2021-09-12 — End: 2021-09-12
  Administered 2021-09-12: 10 meq via ORAL
  Filled 2021-09-12: qty 1

## 2021-09-12 NOTE — Progress Notes (Signed)
Subjective:  Patient is feeling better and states that her strength is improved.  Her diet has been advanced to thick liquid diets.     Intake/Output from previous day:  I/O last 3 completed shifts: In: 1033.1 [P.O.:720; I.V.:213.1; IV Piggyback:100] Out: 250 [Urine:250] Total I/O In: -  Out: 600 [Urine:600] Net IO Since Admission: -2,380.81 mL [09/12/21 1429]  Blood pressure (!) 166/41, pulse 84, temperature (!) 97.4 F (36.3 C), temperature source Axillary, resp. rate (!) 39, height 5' (1.524 m), weight 68.3 kg, SpO2 94 %. Physical Exam Vitals reviewed.  Constitutional:      Appearance: She is well-developed.  Neck:     Vascular: No JVD.  Cardiovascular:     Rate and Rhythm: Normal rate and regular rhythm.     Pulses: Intact distal pulses.          Carotid pulses are  on the right side with bruit and  on the left side with bruit.      Radial pulses are 2+ on the right side and 2+ on the left side.       Dorsalis pedis pulses are 1+ on the right side and 1+ on the left side.       Posterior tibial pulses are 1+ on the right side and 2+ on the left side.     Heart sounds: Murmur heard.     Early systolic murmur is present with a grade of 2/6 at the upper right sternal border.     No gallop.  Pulmonary:     Effort: Pulmonary effort is normal. No accessory muscle usage or respiratory distress.     Breath sounds: Examination of the right-lower field reveals rales. Examination of the left-lower field reveals rales. Decreased breath sounds and rales present. No wheezing or rhonchi.  Abdominal:     General: Abdomen is flat.     Palpations: Abdomen is soft.  Musculoskeletal:     Right shoulder: Normal.     Right lower leg: No edema.     Left lower leg: No edema.     Lab Results:  Recent Labs    07/09/21 0705 08/20/2021 1250 09/05/21 0728  BNP 205.2* 417.3* 849.9*   Lab Results  Component Value Date   NA 143 09/12/2021   K 3.8 09/12/2021   CO2 28 09/12/2021   GLUCOSE  217 (H) 09/12/2021   BUN 73 (H) 09/12/2021   CREATININE 1.90 (H) 09/12/2021   CALCIUM 8.2 (L) 09/12/2021   GFRNONAA 25 (L) 09/12/2021       Latest Ref Rng & Units 09/12/2021    3:31 AM 09/11/2021    2:59 AM 09/10/2021    7:51 AM  BMP  Glucose 70 - 99 mg/dL 217  234  216   BUN 8 - 23 mg/dL 73  62  55   Creatinine 0.44 - 1.00 mg/dL 1.90  1.79  1.68   Sodium 135 - 145 mmol/L 143  146  141   Potassium 3.5 - 5.1 mmol/L 3.8  3.8  4.3   Chloride 98 - 111 mmol/L 108  109  103   CO2 22 - 32 mmol/L _0 Calcium 8.9 - 10.3 mg/dL 8.2  8.5  8.3       Latest Ref Rng & Units 09/08/2021    4:16 AM 09/07/2021    4:31 AM 09/06/2021   12:43 AM  Hepatic Function  Total Protein 6.5 - 8.1 g/dL 6.4  6.2  5.9  Albumin 3.5 - 5.0 g/dL 2.3  2.3  2.3   AST 15 - 41 U/L 40  43  53   ALT 0 - 44 U/L 47  47  53   Alk Phosphatase 38 - 126 U/L 146  141  125   Total Bilirubin 0.3 - 1.2 mg/dL 0.9  0.8  1.0       Latest Ref Rng & Units 09/11/2021    2:59 AM 09/10/2021    7:51 AM 09/09/2021    8:06 AM  CBC  WBC 4.0 - 10.5 K/uL 18.1  14.2  18.8   Hemoglobin 12.0 - 15.0 g/dL 7.4  7.8  7.9   Hematocrit 36.0 - 46.0 % 25.6  26.1  26.6   Platelets 150 - 400 K/uL 359  319  321    Lipid Panel     Component Value Date/Time   CHOL 81 07/09/2021 0705   TRIG 76 07/09/2021 0705   HDL 31 (L) 07/09/2021 0705   CHOLHDL 2.6 07/09/2021 0705   VLDL 15 07/09/2021 0705   LDLCALC 35 07/09/2021 0705   Cardiac Panel (last 3 results) No results for input(s): "CKTOTAL", "CKMB", "TROPONINI", "RELINDX" in the last 72 hours.  HEMOGLOBIN A1C Lab Results  Component Value Date   HGBA1C 7.8 (H) 07/07/2021   MPG 177.16 07/07/2021   TSH Recent Labs    07/08/21 1547  TSH 1.097   Imaging: Imaging results have been reviewed  Cardiac Studies:  Scheduled Meds:  apixaban  2.5 mg Oral BID   Chlorhexidine Gluconate Cloth  6 each Topical Daily   clopidogrel  75 mg Oral Daily   memantine  28 mg Oral QHS   And   donepezil   10 mg Oral QHS   DULoxetine  60 mg Oral Daily   empagliflozin  10 mg Oral Daily   ezetimibe  10 mg Oral Daily   furosemide  40 mg Oral Daily   gabapentin  300 mg Oral BID   insulin aspart  0-15 Units Subcutaneous TID WC   insulin glargine-yfgn  20 Units Subcutaneous Daily   isosorbide dinitrate  30 mg Oral BID   linagliptin  5 mg Oral Daily   methylPREDNISolone (SOLU-MEDROL) injection  60 mg Intravenous Q8H   metoprolol succinate  12.5 mg Oral Daily   mirabegron ER  25 mg Oral Daily   mouth rinse  15 mL Mouth Rinse 4 times per day   pantoprazole  40 mg Oral Daily   potassium chloride  10 mEq Oral Once   rosuvastatin  10 mg Oral QAC supper   sodium chloride flush  3 mL Intravenous Q12H   Continuous Infusions:  sodium chloride 10 mL/hr at 09/11/21 1900   ceFEPime (MAXIPIME) IV Stopped (09/11/21 1831)   PRN Meds:.sodium chloride, acetaminophen **OR** acetaminophen, hydrALAZINE, mouth rinse, polyethylene glycol  Cardiac Studies:    Coronary angiogram 1996: [place] 80% stenosis ostial Left main, 30% stenosis proximal LAD, 80% stenosis proximal RCA CABG  -  LIMA to LAD, SVG to int, SVG to [place], [place].  Repeat catheterization recently has revealed widely patent RCA native and occluded SVG to RCA.   Echocardiogram 07/07/2021:   1. Left ventricular ejection fraction, by estimation, is 35 to 40%. The left ventricle has moderately decreased function. The left ventricle demonstrates regional wall motion abnormalities (see scoring diagram/findings for description). There is mild  left ventricular hypertrophy. Left ventricular diastolic parameters are indeterminate. Elevated left ventricular end-diastolic pressure. There is severe of the  left ventricular, entire anterior wall. There is mild of the left ventricular, mid-apical  anterolateral wall.  2. Right ventricular systolic function is normal. The right ventricular size is normal. There is normal pulmonary artery systolic  pressure. The estimated right ventricular systolic pressure is 16.1 mmHg.  3. Left atrial size was mildly dilated.  4. The mitral valve is normal in structure. Mild mitral valve regurgitation.  5. The aortic valve is tricuspid. Aortic valve regurgitation is mild to moderate. Aortic valve sclerosis/calcification is present, without any evidence of aortic stenosis.  6. The inferior vena cava is normal in size with <50% respiratory variability, suggesting right atrial pressure of 8 mmHg.   Comparison(s): Changes from prior study are noted. Compared to 08/23/2018, LVEF 60-65% without regional wall motion abnormality. Mild MR and TR new.  Compared to 11/27/2019, LVEF is 50 to 55%.  There was no wall motion abnormality.  Otherwise no significant change.    Loop recorder [employer] Reveal LINQ  08/24/2018 Remote loop recorder transmission 08/05/2021: Small episodes of atrial fibrillation, AT/AF burden 6.6%.  No change from previous transmission.   EKG:   EKG 09/08/2021: Probably sinus rhythm with marked first-degree AV block at a rate of 56 bpm, right axis deviation, left posterior fascicular block.  Right bundle branch block.  Nonspecific T abnormality.  Trifascicular block.  Compared to 07/28/2021, no significant change.  Assessment   1.  Acute respiratory distress secondary to pulmonary fibrosis.  I am clinically highly suspicious that her presentation is most consistent with amiodarone induced lung toxicity. 07/06/2021, patient was admitted with acute pulmonary edema with infiltrates in her lungs, I am beginning to wonder if this was the beginning of amiodarone toxicity. She had responded to diuresis at that time, there was no follow-up chest x-ray.  She was discharged home on home oxygen that is new to her.  No obvious etiology was felt to be present, although heart failure was felt to be the etiology, normal wondering if this was related to early onset of amiodarone toxicity.  Presently still on high flow  oxygen at 15 L/min. 2.  Coronary artery disease of the native vessel without angina pectoris 3.  Chronic systolic heart failure secondary to ischemic cardiomyopathy 4.  Diabetes mellitus with stage IV chronic kidney disease. 5.  Paroxysmal atrial fibrillation, maintaining sinus rhythm.  Continue anticoagulation.  Plan:   Patient's oral input has improved.  She has excellent urine output.  Now she is getting slightly volume overloaded with steroids and increased oral intake.  We will restart Jardiance 10 mg daily and restart furosemide 40 mg daily p.o. along with 10 mEq of potassium (home medications).  I do not suspect pneumonia, presently she has completed her antibiotics, would agree with continued high-dose steroids for now.  Appreciate pulmonary critical care recommendations.  With regard to paroxysmal atrial fibrillation, she is maintaining sinus rhythm.  Renal function has remained stable.  I will check BMP and BNP in the morning.  Please call if questions.   Adrian Prows, MD, [facility] 09/12/2021, 2:29 PM Office: [phone] Fax: [phone] Pager: [phone]

## 2021-09-12 NOTE — Progress Notes (Signed)
NAME:  Linda Oliver, MRN:  [id number], DOB:  1932-02-09, LOS: 9 ADMISSION DATE:  09/01/2021, CONSULTATION DATE:  8/31 REFERRING MD:  Dr. Wynelle Cleveland, CHIEF COMPLAINT:  Hypoxia   History of Present Illness:  88 year old female with PMH as below, which is significant for  Most recently admitted back in June for CHF exacerbation and Atrial fibrillation. Improved with diuresis and cardioversion respectively. New tx at discharge included amiodarone and oxygen 2 liters. She presented to [facility] ED 8/25 with complaints of weakness and delirium progressive over a five day period. Poor PO intake and urinary output noted as well. Upon arrival to the ED she was noted to be febrile to 100.5 and have what appeared to be multifocal pneumonia on imaging. She was admitted to the hospitalists for CAP treatment including antibiotics and steroids. Blood cultures positive for E. Coli. She continued to improve with antibiotic therapy until 8/31, when she began to develop shortness of breath, which devolved into hypoxia and respiratory distress. She was transferred to ICU and supplemental oxygen was increased. CT chest consistent with multifocal infiltrates. PCCM consulted.   Pertinent  Medical History  DM,  CAD s/p CABG,  CVA with medial left occipital infarct in embolic pattern by MRI in 2020 and loop recorder implanted showed paroxysmal atrial fibrillation.   OSA on CPAP,  PAF on eliquis,  Seizure, and UTI. stage 3a CKD    Significant Hospital Events: Including procedures, antibiotic start and stop dates in addition to other pertinent events   8/25 admit for CAP 8/26 CT chest without contrast: Bilateral multifocal infiltrates 8/27 BC postive for E. Coli.  8/31 tx to ICU in respiratory distress >> bipap 9/1Given Ativan at 8 PM -slept all day, on BiPAP 9/2 switched to heated high flow nasal cannula 70% / 15 L  Interim History / Subjective:   Placed on heated high flow yesterday, titrated down to 60% / 15 L  this morning Afebrile Hypertensive  Objective   Blood pressure (!) 166/41, pulse 84, temperature (!) 97.3 F (36.3 C), temperature source Axillary, resp. rate (!) 39, height 5' (1.524 m), weight 68.3 kg, SpO2 93 %.    FiO2 (%):  [60 %-70 %] 60 %   Intake/Output Summary (Last 24 hours) at 09/12/2021 0924 Last data filed at 09/11/2021 1900 Gross per 24 hour  Intake 673.11 ml  Output 250 ml  Net 423.11 ml    Filed Weights   09/05/21 0500 09/09/21 0500 09/10/21 0401  Weight: 68.1 kg 71.3 kg 68.3 kg    Examination: General: Frail elderly appearing female on HHFNC, in no distress HENT: [place]/AT, PERRL, no JVD Lungs: Bilateral scattered crackles, no accessory muscle use, no rhonchi Cardiovascular: S1S2 regular, no murmur Abdomen: Soft, non-tender, non-distended.  Extremities: No acute deformity. No edema.  Neuro: Nonfocal, moves all 4 extremities, lethargic  Labs show normal electrolytes,, increasing BUN/creatinine, increase leukocytosis, stable anemia Chest x-ray 9/2 independently reviewed shows worsening bilateral multifocal infiltrates  CT chest without contrast 8/31 reviewed progressive multifocal infiltrates  Resolved Hospital Problem list     Assessment & Plan:   Acute hypoxic respiratory failure Chronic respiratory failure -was discharged 7/4 on 2 L of oxygen Multifocal infiltrates:  Infectious vs inflammatory. Has been treated for CAP x 5 days.  Respiratory viral panel negative. -Chest x-ray in June shows left upper lobe airspace disease was developing then - Amiodarone toxicity?  On amiodarone since June -Use BiPAP as needed for work of breathing -He do not flow nasal  cannula to keep sats > 92%, drop FiO2 first once down to 40% and then can start dialing down liter flow -Completed 5 days of ceftriaxone and azithromycin, then escalated to cefepime -plan for total 10 days -Continue Solu-Medrol 60 every 8 - Hold amiodarone -Swallow evaluation rule out aspiration , will  need MBS at some point  PAF Chronic HFrEF - hold amiodarone  - rest per primary/cardiology who have been consulted.   Acute metabolic encephalopathy Dementia History of occipital CVA -Continue Aricept and Namenda -Avoid CNS depressing medications , somnolence on 9/1 related to Ativan  OSA on CPAP - BiPAP mandatory nightly  DM - per primary  Best Practice (right click and "Reselect all SmartList Selections" daily)   Diet/type: NPO advance diet today DVT prophylaxis: DOAC GI prophylaxis: N/A Lines: N/A Foley:  N/A Code Status:  full code Last date of multidisciplinary goals of care discussion '[ ]'$  updated son-in-law at bedside  Labs   CBC: Recent Labs  Lab 09/06/21 0043 09/07/21 0431 09/08/21 0416 09/09/21 0806 09/10/21 0751 09/11/21 0259  WBC 13.6* 15.5* 19.1* 18.8* 14.2* 18.1*  NEUTROABS 12.3* 14.0* 16.7*  --   --   --   HGB 7.8* 8.0* 8.2* 7.9* 7.8* 7.4*  HCT 26.9* 25.7* 28.0* 26.6* 26.1* 25.6*  MCV 88.5 83.7 86.2 87.2 85.6 88.9  PLT 305 334 366 321 319 359     Basic Metabolic Panel: Recent Labs  Lab 09/06/21 0043 09/07/21 0431 09/08/21 0416 09/10/21 0751 09/11/21 0259 09/12/21 0331  NA 141 138 140 141 146* 143  K 3.5 3.5 3.8 4.3 3.8 3.8  CL 103 102 99 103 109 108  CO2 '29 26 27 25 26 28  '$ GLUCOSE 234* 283* 255* 216* 234* 217*  BUN 39* 53* 58* 55* 62* 73*  CREATININE 1.82* 1.90* 1.99* 1.68* 1.79* 1.90*  CALCIUM 8.5* 8.9 9.2 8.3* 8.5* 8.2*  MG 2.0 2.3 2.2  --   --   --   PHOS 3.0 3.9 3.8  --   --   --     GFR: Estimated Creatinine Clearance: 18 mL/min (A) (by C-G formula based on SCr of 1.9 mg/dL (H)). Recent Labs  Lab 09/08/21 0416 09/09/21 0806 09/10/21 0751 09/11/21 0259  WBC 19.1* 18.8* 14.2* 18.1*     Liver Function Tests: Recent Labs  Lab 09/06/21 0043 09/07/21 0431 09/08/21 0416  AST 53* 43* 40  ALT 53* 47* 47*  ALKPHOS 125 141* 146*  BILITOT 1.0 0.8 0.9  PROT 5.9* 6.2* 6.4*  ALBUMIN 2.3* 2.3* 2.3*    No results for  input(s): "LIPASE", "AMYLASE" in the last 168 hours. No results for input(s): "AMMONIA" in the last 168 hours.  ABG    Component Value Date/Time   PHART 7.43 09/09/2021 1549   PCO2ART 47 09/09/2021 1549   PO2ART 69 (L) 09/09/2021 1549   HCO3 31.2 (H) 09/09/2021 1549   TCO2 37 (H) 07/12/2021 1259   O2SAT 96.2 09/09/2021 1549     Coagulation Profile: No results for input(s): "INR", "PROTIME" in the last 168 hours.   Cardiac Enzymes: No results for input(s): "CKTOTAL", "CKMB", "CKMBINDEX", "TROPONINI" in the last 168 hours.  HbA1C: Hgb A1c MFr Bld  Date/Time Value Ref Range Status  07/07/2021 05:25 AM 7.8 (H) 4.8 - 5.6 % Final    Comment:    (NOTE) Pre diabetes:          5.7%-6.4%  Diabetes:              >6.4%  Glycemic control for   <7.0% adults with diabetes   08/23/2018 04:34 AM 8.7 (H) 4.8 - 5.6 % Final    Comment:    (NOTE) Pre diabetes:          5.7%-6.4% Diabetes:              >6.4% Glycemic control for   <7.0% adults with diabetes     CBG: Recent Labs  Lab 09/11/21 1215 09/11/21 1548 09/11/21 1735 09/11/21 2211 09/12/21 0731  GLUCAP 221* 159* 154* 146* 249*      Kara Mead MD. FCCP. [facility] Pager : [phone]  If no response to pager , please call [phone] until 7 pm After 7:00 pm call Elink  [phone]    09/12/2021 9:24 AM

## 2021-09-12 NOTE — Progress Notes (Signed)
Speech Language Pathology Treatment: Dysphagia  Patient Details Name: Linda Oliver MRN: [id number] DOB: 1932/10/15 Today's Date: 09/12/2021 Time: 1152-1207 SLP Time Calculation (min) (ACUTE ONLY): 15 min  Assessment / Plan / Recommendation Clinical Impression  Pt more alert than yesterday, sitting up and talking to granddaughter eager to [place]. She presents with a respiratory based dysphagia and suspected concerns coordinating swallow with respirations. Immediate cough with first sip water that may have been due to decreased cohesion and premature spill however pt continued to throat clear with water. No overt signs with cracker or puree. Pt satisfied with initiating nectar thick and Dys 2 today with MBS tomorrow to further assess swallow/respiratory synchronicity. Pt can take pills whole in puree or crushed if large and ST to follow up.    HPI HPI: This is an 88 year old female who presented to the hospital for hallucinations and weakness.  Per family delirium had been increasing for about a week. Her PCP had felt she may have a UTI and she was started on cephalexin the day before coming to the hospital.  She was initially admitted for multifocal pneumonia and started on ceftriaxone and azithromycin.  She was later found to have E. coli bacteremia. Pulmonology consulted ordered SLP b/c aspiration part of differential dx.      SLP Plan  MBS      Recommendations for follow up therapy are one component of a multi-disciplinary discharge planning process, led by the attending physician.  Recommendations may be updated based on patient status, additional functional criteria and insurance authorization.    Recommendations  Diet recommendations: Nectar-thick liquid;Dysphagia 2 (fine chop) Liquids provided via: Cup;Straw Medication Administration: Whole meds with puree Supervision: Staff to assist with self feeding;Full supervision/cueing for compensatory strategies Compensations: Slow  rate;Small sips/bites Postural Changes and/or Swallow Maneuvers: Seated upright 90 degrees                Oral Care Recommendations: Oral care BID Follow Up Recommendations: Skilled nursing-short term rehab (<3 hours/day) Assistance recommended at discharge: Frequent or constant Supervision/Assistance SLP Visit Diagnosis: Dysphagia, unspecified (R13.10) Plan: MBS           Houston Siren  09/12/2021, 12:13 PM

## 2021-09-12 NOTE — Progress Notes (Addendum)
[facility] Progress Note  Patient: Linda Oliver     VXB:[id number]  DOA: 08/24/2021   PCP: Shon Baton, MD       Brief hospital course: This is an 88 year old female with CKD stage IIIb, atrial fibrillation, coronary artery disease status post stenting and CABG, diabetes mellitus type 2, OSA, obesity who presented to the hospital for hallucinations and weakness.  Per family delirium had been increasing for about a week. Her PCP had felt she may have a UTI and she was started on cephalexin the day before coming to the hospital. She was initially admitted for multifocal pneumonia and started on ceftriaxone and azithromycin.  She was later found to have E. coli bacteremia.  She was hospitalized from 6/27 through 07/13/21 for acute CHF, lobar pneumonia and an NSTEMI. She underwent a cath which reveals severe native vessel disease.   8/31- resp distress noted in afternoon- transferred to ICU- Lasix IV produced a good bit of urine but did not seem to help respiration- CT showing increasing pulmonary infiltrates- PCCM consulted  9/1-quite sleepy throughout the day after receiving a small dose of Ativan the night before  Subjective:  Awake and alert this AM. States she is hungry. Does admit to feeling short of breath.   Assessment and Plan: Principal Problem:   Multifocal infiltrates Acute on Chronic hypoxic resp failure - ? Bacterial pneumonia, ? Amiodarone toxicity ? ARDS  - she was discharged home with 2 L oxygen on 7/4 with was new for her - also received antibiotics on previous admission and was discharged with Augmentin  - she has completed 5 days of azithromycin - Ceftriaxone was changed to Cefepime and solumedrol (which was being weaned down) was increased by PCCM on 8/31 -9/3>On 15 L high flow today- have asked for it to be weaned - RR is rapid today and she has abdominal breathing again. - awaiting SLP eval - may be aspirating   - PCCM recommends total 10 days of  antibiotics    Active Problems:   E. coli bacteremia   -  cont Cefepime for now- today is day 8 of treatment  Acute encephalopathy- h/o dementia - related to acute infection when admitted but had improved considerably - dementia is mild with sundowning at home per family - Continue Aricept and Namenda - discharged home with Seroquel but family states they were hesitant to give this to her and have not been  Acute on chronic systolic heart failure - EF 35% - she appears adequately diuresed and Lasix is on hold -She has significant crackles in both lungs due to a primary pulmonary pathology and on exam it is difficult to determine her fluid status - onlyl 250 cc of urine output documented from yesterday- I was not notified- I am doubting the accuracy of this - weight is likely inaccurate as well as she has lost 7 lbs in one day - defer diuresis to cardiology  AKI on CKD stage 3b - baseline Cr in July was ~ 1.3- 1.5 - Cr up to 1.90-    Hypokalemia - Has been replaced  Coronary artery disease -She is receiving Plavix in addition to Eliquis - Continue Isordil, Zetia and Crestor  Paroxysmal atrial fibrillation, h/o CVA in 2020 - Continue metoprolol and Eliquis - discharged with Amio on 7/4- family state she was not longer receiving Amiodarone at home- not sure when this was stopped - has a loop recorder  Diabetes mellitus type 2 - Continue Semglee and sliding scale  insulin- - Semglee increased on 9/1-follow sugars - cont Januvia (replaced by Tradjenta in hospital)  - hold Jardiance in setting of increased Creatinine - cont to hold Amaryl Hemoglobin A1C    Component Value Date/Time   HGBA1C 7.8 (H) 07/07/2021 0525    OSA - uses a CPAP at night  H/o b/l carotid artery stenosis - surveillance stopped due to age  69 - PT has recommended SNF- Family prefers [facility] with [facility] if possible as long as she can transfer from bed to chair     DVT prophylaxis:   apixaban (ELIQUIS) tablet 2.5 mg Start: 09/10/21 2200 SCDs Start: 09/04/2021 2332 apixaban (ELIQUIS) tablet 2.5 mg    Code Status: Full Code  Consultants: None Level of Care: Level of care: Stepdown Disposition Plan:  Status is: Inpatient Remains inpatient appropriate because: Ongoing treatment of dyspnea, infiltrates and hypoxia  Objective:   Vitals:   09/12/21 0747 09/12/21 0800 09/12/21 0802 09/12/21 1005  BP:  (!) 166/41 (!) 166/41   Pulse: 79 82 84   Resp: (!) 27 (!) 26 (!) 39   Temp:   (!) 97.3 F (36.3 C)   TempSrc:   Axillary   SpO2: 94% 93% 93% 93%  Weight:      Height:       Filed Weights   09/05/21 0500 09/09/21 0500 09/10/21 0401  Weight: 68.1 kg 71.3 kg 68.3 kg   Exam: General exam: Appears comfortable  HEENT: oral mucosa moist Respiratory system: b/l crackles, elevated RR and abdominal breathing noted Cardiovascular system: S1 & S2 heard  Gastrointestinal system: Abdomen soft, non-tender, nondistended. Normal bowel sounds   Extremities: No cyanosis, clubbing or edema Psychiatry:  Mood & affect appropriate.    Imaging and lab data was personally reviewed    CBC: Recent Labs  Lab 09/06/21 0043 09/07/21 0431 09/08/21 0416 09/09/21 0806 09/10/21 0751 09/11/21 0259  WBC 13.6* 15.5* 19.1* 18.8* 14.2* 18.1*  NEUTROABS 12.3* 14.0* 16.7*  --   --   --   HGB 7.8* 8.0* 8.2* 7.9* 7.8* 7.4*  HCT 26.9* 25.7* 28.0* 26.6* 26.1* 25.6*  MCV 88.5 83.7 86.2 87.2 85.6 88.9  PLT 305 334 366 321 319 009    Basic Metabolic Panel: Recent Labs  Lab 09/06/21 0043 09/07/21 0431 09/08/21 0416 09/10/21 0751 09/11/21 0259 09/12/21 0331  NA 141 138 140 141 146* 143  K 3.5 3.5 3.8 4.3 3.8 3.8  CL 103 102 99 103 109 108  CO2 '29 26 27 25 26 28  '$ GLUCOSE 234* 283* 255* 216* 234* 217*  BUN 39* 53* 58* 55* 62* 73*  CREATININE 1.82* 1.90* 1.99* 1.68* 1.79* 1.90*  CALCIUM 8.5* 8.9 9.2 8.3* 8.5* 8.2*  MG 2.0 2.3 2.2  --   --   --   PHOS 3.0 3.9 3.8  --   --   --      GFR: Estimated Creatinine Clearance: 18 mL/min (A) (by C-G formula based on SCr of 1.9 mg/dL (H)).  Scheduled Meds:  apixaban  2.5 mg Oral BID   Chlorhexidine Gluconate Cloth  6 each Topical Daily   clopidogrel  75 mg Oral Daily   memantine  28 mg Oral QHS   And   donepezil  10 mg Oral QHS   DULoxetine  60 mg Oral Daily   ezetimibe  10 mg Oral Daily   gabapentin  300 mg Oral BID   insulin aspart  0-15 Units Subcutaneous TID WC   insulin glargine-yfgn  20 Units  Subcutaneous Daily   isosorbide dinitrate  30 mg Oral BID   linagliptin  5 mg Oral Daily   methylPREDNISolone (SOLU-MEDROL) injection  60 mg Intravenous Q8H   metoprolol succinate  12.5 mg Oral Daily   mirabegron ER  25 mg Oral Daily   mouth rinse  15 mL Mouth Rinse 4 times per day   pantoprazole  40 mg Oral Daily   rosuvastatin  10 mg Oral QAC supper   sodium chloride flush  3 mL Intravenous Q12H   Continuous Infusions:  sodium chloride 10 mL/hr at 09/11/21 1900   ceFEPime (MAXIPIME) IV Stopped (09/11/21 1831)     LOS: 9 days   Author: Debbe Odea  09/12/2021 10:55 AM

## 2021-09-12 NOTE — Progress Notes (Signed)
Pt taken off [place] and placed on bipap. Pt is tolerating it well at this time.

## 2021-09-12 NOTE — Progress Notes (Signed)
Weaning patient as tolerated

## 2021-09-13 ENCOUNTER — Inpatient Hospital Stay (HOSPITAL_COMMUNITY): Payer: Medicare Other

## 2021-09-13 DIAGNOSIS — D649 Anemia, unspecified: Secondary | ICD-10-CM

## 2021-09-13 DIAGNOSIS — J9601 Acute respiratory failure with hypoxia: Secondary | ICD-10-CM | POA: Diagnosis not present

## 2021-09-13 DIAGNOSIS — J189 Pneumonia, unspecified organism: Secondary | ICD-10-CM | POA: Diagnosis not present

## 2021-09-13 DIAGNOSIS — N189 Chronic kidney disease, unspecified: Secondary | ICD-10-CM | POA: Diagnosis not present

## 2021-09-13 DIAGNOSIS — N179 Acute kidney failure, unspecified: Secondary | ICD-10-CM | POA: Diagnosis not present

## 2021-09-13 LAB — BASIC METABOLIC PANEL
Anion gap: 8 (ref 5–15)
Anion gap: 12 (ref 5–15)
BUN: 90 mg/dL — ABNORMAL HIGH (ref 8–23)
BUN: 89 mg/dL — ABNORMAL HIGH (ref 8–23)
CO2: 26 mmol/L (ref 22–32)
CO2: 26 mmol/L (ref 22–32)
Calcium: 8.3 mg/dL — ABNORMAL LOW (ref 8.9–10.3)
Calcium: 8.8 mg/dL — ABNORMAL LOW (ref 8.9–10.3)
Chloride: 109 mmol/L (ref 98–111)
Chloride: 107 mmol/L (ref 98–111)
Creatinine, Ser: 2.12 mg/dL — ABNORMAL HIGH (ref 0.44–1.00)
Creatinine, Ser: 2.43 mg/dL — ABNORMAL HIGH (ref 0.44–1.00)
GFR, Estimated: 22 mL/min — ABNORMAL LOW (ref >60)
GFR, Estimated: 19 mL/min — ABNORMAL LOW (ref >60)
Glucose, Bld: 301 mg/dL — ABNORMAL HIGH (ref 70–99)
Glucose, Bld: 324 mg/dL — ABNORMAL HIGH (ref 70–99)
Potassium: 3.7 mmol/L (ref 3.5–5.1)
Potassium: 3.5 mmol/L (ref 3.5–5.1)
Sodium: 143 mmol/L (ref 135–145)
Sodium: 145 mmol/L (ref 135–145)

## 2021-09-13 LAB — GLUCOSE, CAPILLARY
Glucose-Capillary: 306 mg/dL — ABNORMAL HIGH (ref 70–99)
Glucose-Capillary: 294 mg/dL — ABNORMAL HIGH (ref 70–99)
Glucose-Capillary: 300 mg/dL — ABNORMAL HIGH (ref 70–99)
Glucose-Capillary: 187 mg/dL — ABNORMAL HIGH (ref 70–99)

## 2021-09-13 LAB — BRAIN NATRIURETIC PEPTIDE: B Natriuretic Peptide: 335.2 pg/mL — ABNORMAL HIGH (ref 0.0–100.0)

## 2021-09-13 MED ORDER — INSULIN GLARGINE-YFGN 100 UNIT/ML ~~LOC~~ SOLN
30.0000 [IU] | Freq: Every day | SUBCUTANEOUS | Status: DC
  Administered 2021-09-13 – 2021-09-16 (×3): 30 [IU] via SUBCUTANEOUS
  Filled 2021-09-13 (×4): qty 0.3

## 2021-09-13 MED ORDER — SODIUM CHLORIDE 0.9 % IV SOLN
2.0000 g | Freq: Every day | INTRAVENOUS | Status: AC
  Administered 2021-09-13: 2 g via INTRAVENOUS
  Filled 2021-09-13: qty 12.5

## 2021-09-13 NOTE — Evaluation (Signed)
Occupational Therapy Re-Evaluation Patient Details Name: Linda Oliver MRN: [id number] DOB: Oct 29, 1932 Today's Date: 09/13/2021   History of Present Illness Patient is a 88 year old female with medical history significant of diabetes, neuropathy, GERD, obesity, carotid artery disease, CAD status post CABG, CVA, CHF, hypertension, atrial fibrillation, OSA on CPAP, mild cognitive impairment, anemia presenting with altered mental status. The work-up reveals that she has an E. coli bacteremia and will obtain a CT of the chest without contrast to further evaluate for her pneumonia. Patient was transitioned down to ICU on 8/31 with respiratory distress. 9/2 patient was transitioned to heated high flow.   Clinical Impression   Patient was reevaluated on this date with change in medical status since transitioning down to ICU on 8/31 with respiratory distress. Patient was living at home alone with family support prior level. Currently, patient is max A+2 for all movements with max A for self feeding with increased fatigue with minimal movements and significant weakness in BUE and BLE. Patient is not at a level to transition home without +2 physical support 24/7 at this time. Patients family have noted in chart in pas that patient would be able to transition home with family support if she was able to transfer.  Patient was noted to have decreased functional activity tolernace, decreased ROM, decreased BUE strength, decreased endurance, decreased sitting balance, decreased standing balanced, decreased safety awareness, and decreased knowledge of AE/AD impacting participation in ADLs. Patient would continue to benefit from skilled OT services at this time while admitted and after d/c to address noted deficits in order to improve overall safety and independence in ADLs.        Recommendations for follow up therapy are one component of a multi-disciplinary discharge planning process, led by the attending physician.   Recommendations may be updated based on patient status, additional functional criteria and insurance authorization.   Follow Up Recommendations  Skilled nursing-short term rehab (<3 hours/day)    Assistance Recommended at Discharge Frequent or constant Supervision/Assistance  Patient can return home with the following Two people to help with walking and/or transfers;Two people to help with bathing/dressing/bathroom;Help with stairs or ramp for entrance;Direct supervision/assist for medications management;Assistance with feeding;Assistance with cooking/housework;Direct supervision/assist for financial management;Assist for transportation    Functional Status Assessment  Patient has had a recent decline in their functional status and demonstrates the ability to make significant improvements in function in a reasonable and predictable amount of time.  Equipment Recommendations  Other (comment) (defer to next venue)    Recommendations for Other Services       Precautions / Restrictions Precautions Precautions: Fall Precaution Comments: 2L/min at baseline O2 Restrictions Weight Bearing Restrictions: No      Mobility Bed Mobility Overal bed mobility: Needs Assistance       Supine to sit: Total assist, +2 for safety/equipment, +2 for physical assistance Sit to supine: Total assist, +2 for safety/equipment, +2 for physical assistance   General bed mobility comments: patient needed physical assistance for supine to sit on EOB with noted weakness in BUE and trunk with need for physical support to maintain upright posture.    Transfers                          Balance Overall balance assessment: Needs assistance Sitting-balance support: Single extremity supported, Feet supported Sitting balance-Leahy Scale: Poor       Standing balance-Leahy Scale: Zero  ADL either performed or assessed with clinical judgement   ADL Overall  ADL's : Needs assistance/impaired Eating/Feeding: Total assistance Eating/Feeding Details (indicate cue type and reason): patients fatigue level and decreased strenght in BUE Grooming: Maximal assistance   Upper Body Bathing: Maximal assistance;Bed level   Lower Body Bathing: Bed level;Total assistance   Upper Body Dressing : Maximal assistance;Bed level   Lower Body Dressing: Bed level;Total assistance   Toilet Transfer: +2 for physical assistance;Total assistance;+2 for safety/equipment Toilet Transfer Details (indicate cue type and reason): Patient attempted standing on EOB with noted instability in trunk and BLE with consistent buckling of bilateral knees with blocking. Toileting- Clothing Manipulation and Hygiene: Total assistance;Bed level       Functional mobility during ADLs: +2 for physical assistance;+2 for safety/equipment;Total assistance       Vision   Vision Assessment?: No apparent visual deficits     Perception     Praxis      Pertinent Vitals/Pain Pain Assessment Pain Assessment: No/denies pain     Hand Dominance Right   Extremity/Trunk Assessment Upper Extremity Assessment Upper Extremity Assessment: RUE deficits/detail;LUE deficits/detail RUE Deficits / Details: patient was unable to hold testing position for any UE movements today. patient was noted to be very quick to fatigue as well 2+ shoulder RUE Coordination: decreased fine motor;decreased gross motor LUE Deficits / Details: patient has limited strength to be able to preform movements requested. patient can make fist with no strength behind it. unable to hold any testing positions with UE.2+ shoulder LUE Coordination: decreased gross motor;decreased fine motor   Lower Extremity Assessment Lower Extremity Assessment: Defer to PT evaluation   Cervical / Trunk Assessment Cervical / Trunk Assessment: Kyphotic   Communication Communication Communication: HOH   Cognition Arousal/Alertness:  Awake/alert Behavior During Therapy: Flat affect Overall Cognitive Status: History of cognitive impairments - at baseline                                 General Comments: patients significant other was present in room during session     General Comments       Exercises     Shoulder Instructions      Home Living Family/patient expects to be discharged to:: Private residence Living Arrangements: Children Available Help at Discharge: Family;Available 24 hours/day Type of Home: House Home Access: Stairs to enter [employer] of Steps: 3 Entrance Stairs-Rails: Can reach both Home Layout: Two level;Able to live on main level with bedroom/bathroom     Bathroom Shower/Tub: Occupational psychologist: Handicapped height Bathroom Accessibility: No   Home Equipment: Rollator (4 wheels);Cane - quad;Shower seat;Other (comment);BSC/3in1   Additional Comments: Has an apartment built onto her daughters home      Prior Functioning/Environment Prior Level of Function : Needs assist             Mobility Comments: uses a rollator ADLs Comments: Performs ADLs. Daughter assists with IADLs - has required increased assistance this last week        OT Problem List: Decreased strength;Decreased activity tolerance;Impaired balance (sitting and/or standing);Decreased cognition;Decreased safety awareness;Decreased knowledge of use of DME or AE;Obesity;Cardiopulmonary status limiting activity      OT Treatment/Interventions: Self-care/ADL training;Therapeutic exercise;Energy conservation;DME and/or AE instruction;Therapeutic activities;Balance training;Patient/family education;Cognitive remediation/compensation    OT Goals(Current goals can be found in the care plan section) Acute Rehab OT Goals Patient Stated Goal: to get out of bed OT Goal  Formulation: With family Time For Goal Achievement: 09/27/21 Potential to Achieve Goals: Fair  OT Frequency: Min  2X/week    Co-evaluation              AM-PAC OT "6 Clicks" Daily Activity     Outcome Measure Help from another person eating meals?: A Lot Help from another person taking care of personal grooming?: A Lot Help from another person toileting, which includes using toliet, bedpan, or urinal?: Total Help from another person bathing (including washing, rinsing, drying)?: Total Help from another person to put on and taking off regular upper body clothing?: Total Help from another person to put on and taking off regular lower body clothing?: Total 6 Click Score: 8   End of Session Equipment Utilized During Treatment: Rolling walker (2 wheels);Oxygen Nurse Communication: Mobility status  Activity Tolerance: Patient tolerated treatment well Patient left: with call bell/phone within reach;in bed;with bed alarm set;with family/visitor present  OT Visit Diagnosis: Muscle weakness (generalized) (M62.81);Other symptoms and signs involving cognitive function                Time: 1050-1109 OT Time Calculation (min): 19 min Charges:  OT General Charges $OT Visit: 1 Visit OT Evaluation $OT Re-eval: 1 Re-eval  Rennie Plowman, MS Acute Rehabilitation Department Office# [phone]   Feliz Beam Matty Deamer 09/13/2021, 1:32 PM

## 2021-09-13 NOTE — Progress Notes (Addendum)
NAME:  Linda Oliver, MRN:  [id number], DOB:  Jan 01, 1933, LOS: 24 ADMISSION DATE:  08/27/2021, CONSULTATION DATE:  8/31 REFERRING MD:  Dr. Wynelle Cleveland, CHIEF COMPLAINT:  Hypoxia   History of Present Illness:  88 year old female with PMH as below, which is significant for  Most recently admitted back in June for CHF exacerbation and Atrial fibrillation. Improved with diuresis and cardioversion respectively. New tx at discharge included amiodarone and oxygen 2 liters. She presented to [facility] ED 8/25 with complaints of weakness and delirium progressive over a five day period. Poor PO intake and urinary output noted as well. Upon arrival to the ED she was noted to be febrile to 100.5 and have what appeared to be multifocal pneumonia on imaging. She was admitted to the hospitalists for CAP treatment including antibiotics and steroids. Blood cultures positive for E. Coli. She continued to improve with antibiotic therapy until 8/31, when she began to develop shortness of breath, which devolved into hypoxia and respiratory distress. She was transferred to ICU and supplemental oxygen was increased. CT chest consistent with multifocal infiltrates. PCCM consulted.   Pertinent  Medical History  DM,  CAD s/p CABG,  CVA with medial left occipital infarct in embolic pattern by MRI in 2020 and loop recorder implanted showed paroxysmal atrial fibrillation.   OSA on CPAP,  PAF on eliquis,  Seizure, and UTI. stage 3a CKD    Significant Hospital Events: Including procedures, antibiotic start and stop dates in addition to other pertinent events   8/25 admit for CAP 8/26 CT chest without contrast: Bilateral multifocal infiltrates 8/27 BC postive for E. Coli.  8/31 tx to ICU in respiratory distress >> bipap 9/1Given Ativan at 8 PM -slept all day, on BiPAP 9/2 switched to heated high flow nasal cannula 70% / 15 L 9/3 on HHFNC 50% / 15 L , advance to dysphagia 2 diet 9/5 modified barium swallow showing moderately  severe dysphagia.   Interim History / Subjective:   Weaned to 2L this morning BiPAP at night Very weak and frail this morning.  Issues swallowing yesterday.   Had MBS this morning limited by lethargy. With limited testing it did appear she has severe oropharyngeal dysphagia.   Objective   Blood pressure (!) 174/31, pulse 71, temperature 97.6 F (36.4 C), temperature source Axillary, resp. rate 17, height 5' (1.524 m), weight 68.5 kg, SpO2 96 %.    FiO2 (%):  [50 %-55 %] 50 %   Intake/Output Summary (Last 24 hours) at 09/13/2021 0843 Last data filed at 09/13/2021 0630 Gross per 24 hour  Intake 285.39 ml  Output 1400 ml  Net -1114.61 ml    Filed Weights   09/09/21 0500 09/10/21 0401 09/13/21 0500  Weight: 71.3 kg 68.3 kg 68.5 kg    Examination: General: Frail elderly female in NAD HENT: [place]/AT, PERRL, no JVD Lungs: Bilateral scattered crackles, no accessory muscle use, no rhonchi Cardiovascular: S1S2 regular, no murmur Abdomen: Soft, non-tender, non-distended.  Extremities: No acute deformity. No edema.  Neuro: Nonfocal, interactive  Labs show normal electrolytes,, rising BUN/creatinine, BNP 335 Chest x-ray 9/4 independently reviewed shows persistent bilateral airspace opacities, interstitial and alveolar  CT chest without contrast 8/31 reviewed progressive multifocal infiltrates  Resolved Hospital Problem list     Assessment & Plan:   Acute hypoxic respiratory failure Chronic respiratory failure -was discharged 7/4 on 2 L of oxygen Multifocal infiltrates:  Infectious vs inflammatory. Has been treated for CAP x 5 days.  Respiratory viral panel  negative. Amiodarone toxicity high on differential since infiltrates started in June. Also seems as though aspiration is a lingering issue. - Supplemental O2 to keep SpO2 > 90% > weaned to 2L today. - Use BiPAP as needed for work of breathing, and QHS (CPAP at home) - Completed 5 days of ceftriaxone and azithromycin, then  escalated to cefepime -plan for total 10 days - Wean solumedrol to '60mg'$  BID - Holding amiodarone - Swallow evaluation ongoing, dysphagia 2 diet, will need MBS at some point  PAF Chronic HFrEF - hold amiodarone  - rest per primary/cardiology who have been consulted.  - hold Lasix since creatinine rising  Acute metabolic encephalopathy Dementia History of occipital CVA -Continue Aricept and Namenda -Avoid CNS depressing medications , somnolence on 9/1 related to Ativan  OSA on CPAP - BiPAP mandatory nightly  DM - per primary  Best Practice (right click and "Reselect all SmartList Selections" daily)   Diet/type: NPO advance diet today DVT prophylaxis: DOAC GI prophylaxis: N/A Lines: N/A Foley:  N/A Code Status:  full code Last date of multidisciplinary goals of care discussion '[ ]'$  9/3 updated son-in-law at bedside, discussed poor prognosis should she need mechanical ventilation given advanced age and comorbidities.  He will discuss with daughter regarding changing to DNR status  Labs   CBC: Recent Labs  Lab 09/07/21 0431 09/08/21 0416 09/09/21 0806 09/10/21 0751 09/11/21 0259  WBC 15.5* 19.1* 18.8* 14.2* 18.1*  NEUTROABS 14.0* 16.7*  --   --   --   HGB 8.0* 8.2* 7.9* 7.8* 7.4*  HCT 25.7* 28.0* 26.6* 26.1* 25.6*  MCV 83.7 86.2 87.2 85.6 88.9  PLT 334 366 321 319 359     Basic Metabolic Panel: Recent Labs  Lab 09/07/21 0431 09/08/21 0416 09/10/21 0751 09/11/21 0259 09/12/21 0331 09/13/21 0250  NA 138 140 141 146* 143 143  K 3.5 3.8 4.3 3.8 3.8 3.7  CL 102 99 103 109 108 109  CO2 '26 27 25 26 28 26  '$ GLUCOSE 283* 255* 216* 234* 217* 301*  BUN 53* 58* 55* 62* 73* 90*  CREATININE 1.90* 1.99* 1.68* 1.79* 1.90* 2.12*  CALCIUM 8.9 9.2 8.3* 8.5* 8.2* 8.3*  MG 2.3 2.2  --   --   --   --   PHOS 3.9 3.8  --   --   --   --     GFR: Estimated Creatinine Clearance: 16.1 mL/min (A) (by C-G formula based on SCr of 2.12 mg/dL (H)). Recent Labs  Lab 09/08/21 0416  09/09/21 0806 09/10/21 0751 09/11/21 0259  WBC 19.1* 18.8* 14.2* 18.1*     Liver Function Tests: Recent Labs  Lab 09/07/21 0431 09/08/21 0416  AST 43* 40  ALT 47* 47*  ALKPHOS 141* 146*  BILITOT 0.8 0.9  PROT 6.2* 6.4*  ALBUMIN 2.3* 2.3*    No results for input(s): "LIPASE", "AMYLASE" in the last 168 hours. No results for input(s): "AMMONIA" in the last 168 hours.  ABG    Component Value Date/Time   PHART 7.43 09/09/2021 1549   PCO2ART 47 09/09/2021 1549   PO2ART 69 (L) 09/09/2021 1549   HCO3 31.2 (H) 09/09/2021 1549   TCO2 37 (H) 07/12/2021 1259   O2SAT 96.2 09/09/2021 1549     Coagulation Profile: No results for input(s): "INR", "PROTIME" in the last 168 hours.   Cardiac Enzymes: No results for input(s): "CKTOTAL", "CKMB", "CKMBINDEX", "TROPONINI" in the last 168 hours.  HbA1C: Hgb A1c MFr Bld  Date/Time Value Ref Range  Status  07/07/2021 05:25 AM 7.8 (H) 4.8 - 5.6 % Final    Comment:    (NOTE) Pre diabetes:          5.7%-6.4%  Diabetes:              >6.4%  Glycemic control for   <7.0% adults with diabetes   08/23/2018 04:34 AM 8.7 (H) 4.8 - 5.6 % Final    Comment:    (NOTE) Pre diabetes:          5.7%-6.4% Diabetes:              >6.4% Glycemic control for   <7.0% adults with diabetes     CBG: Recent Labs  Lab 09/11/21 2211 09/12/21 0731 09/12/21 1229 09/12/21 1600 09/13/21 0814  GLUCAP 146* 249* 295* 337* 306*       Georgann Housekeeper, AGACNP-BC [facility]  See Amion for personal pager PCCM on call pager [phone] until 7pm. Please call Elink 7p-7a. [phone]  09/14/2021 10:28 AM

## 2021-09-13 NOTE — Inpatient Diabetes Management (Signed)
Inpatient Diabetes Program Recommendations  AACE/ADA: New Consensus Statement on Inpatient Glycemic Control (2015)  Target Ranges:  Prepandial:   less than 140 mg/dL      Peak postprandial:   less than 180 mg/dL (1-2 hours)      Critically ill patients:  140 - 180 mg/dL    Latest Reference Range & Units 09/12/21 07:31 09/12/21 12:29 09/12/21 16:00  Glucose-Capillary 70 - 99 mg/dL 249 (H) 295 (H) 337 (H)  (H): Data is abnormally high  Latest Reference Range & Units 09/13/21 08:14  Glucose-Capillary 70 - 99 mg/dL 306 (H)  (H): Data is abnormally high     Home DM Meds: Jardiance 10 mg daily Amaryl 1 mg QAM Januvia 100 mg QAM Lantus 20-40 units QAM   Current Orders: Semglee 30 units Daily Jardiance 10 mg Daily Tradjenta 5 mg Daily Novolog Moderate Correction Scale/ SSI (0-15 units) TID AC    MD- Note Semglee increased to 30 units Daily this AM  While pt getting Solumedrol 60 mg Q8 hours and CBGs >250, please consider increasing the frequency of the Novolog SSI to Q4 hours    --Will follow patient during hospitalization--  Wyn Quaker RN, MSN, [place] Diabetes Coordinator Inpatient Glycemic Control Team Team Pager: [phone] (8a-5p)

## 2021-09-13 NOTE — Plan of Care (Signed)
  Problem: Elimination: Goal: Will not experience complications related to bowel motility Outcome: Progressing Goal: Will not experience complications related to urinary retention Outcome: Progressing   Problem: Pain Managment: Goal: General experience of comfort will improve Outcome: Progressing   

## 2021-09-13 NOTE — Progress Notes (Signed)
[facility] Progress Note  Patient: Linda Oliver     JEH:[id number]  DOA: 09/08/2021   PCP: Shon Baton, MD       Brief hospital course: This is an 88 year old female with CKD stage IIIb, atrial fibrillation, coronary artery disease status post stenting and CABG, diabetes mellitus type 2, OSA, obesity who presented to the hospital for hallucinations and weakness.  Per family delirium had been increasing for about a week. Her PCP had felt she may have a UTI and she was started on cephalexin the day before coming to the hospital. She was initially admitted for multifocal pneumonia and started on ceftriaxone and azithromycin.  She was later found to have E. coli bacteremia.  She was hospitalized from 6/27 through 07/13/21 for acute CHF, lobar pneumonia and an NSTEMI. She underwent a cath which reveals severe native vessel disease.   8/31- resp distress noted in afternoon- transferred to ICU- Lasix IV produced a good bit of urine but did not seem to help respiration- CT showing increasing pulmonary infiltrates- PCCM consulted  9/1-quite sleepy throughout the day after receiving a small dose of Ativan the night before  Subjective:  Awake and alert on BiPAP this AM. Has no complaints.   Assessment and Plan: Principal Problem:   Multifocal infiltrates Acute on Chronic hypoxic resp failure - ? Bacterial pneumonia, ? Amiodarone toxicity  ? Recurrent aspirations  - she was discharged home with 2 L oxygen on 7/4 with was new for her-  - also received antibiotics on previous admission and was discharged with Augmentin  - she has completed 5 days of azithromycin - Ceftriaxone was changed to Cefepime and solumedrol (which was being weaned down) was increased by PCCM on 8/31 - PCCM recommends total 10 days of antibiotics- today will be the last day - Hi flow nasal cannula being alternated with BiPAP while in SDU    Active Problems:   E. coli bacteremia   -  cont Cefepime for now-    Acute encephalopathy- h/o dementia - related to acute infection when admitted but had improved considerably - dementia is mild with sundowning at home per family - Continue Aricept and Namenda - discharged home with Seroquel but family states they were hesitant to give this to her and have not been  Acute on chronic systolic heart failure - EF 35% - she appears adequately diuresed and Lasix is on hold -She has some crackles in both lungs due to a primary pulmonary pathology and on exam it is difficult to determine her fluid status - defer diuresis to cardiology  AKI on CKD stage 3b - baseline Cr in July was ~ 1.3- 1.5 - diuretics per cardiology   Hypokalemia - Has been replaced  Coronary artery disease -She is receiving Plavix in addition to Eliquis - Continue Isordil, Zetia and Crestor  Paroxysmal atrial fibrillation, h/o CVA in 2020 - Continue metoprolol and Eliquis - discharged with Amio on 7/4- family state she was not longer receiving Amiodarone at home- not sure when this was stopped - has a loop recorder  Anemia, normocytic - likely related to acute illness - no blood loss noted in the hospital - Hgb ranging from 7-8 during this admission- follow intermittently - Hbg 10-11 in June and July of this year  Diabetes mellitus type 2 - Continue Semglee and sliding scale insulin- - Hyperglycemic due to steroids - Semglee increased today as sugars have risen when oral intake resumed on 9/3  - cont Januvia (replaced by  Tradjenta in hospital)  - hold Jardiance in setting of increased Creatinine - cont to hold Amaryl Hemoglobin A1C    Component Value Date/Time   HGBA1C 7.8 (H) 07/07/2021 0525    OSA - uses a CPAP at night  H/o b/l carotid artery stenosis - surveillance stopped due to age  64 - PT has recommended SNF- Family prefers [facility] if possible as long as she can transfer from bed to chair-she is still quite weak and likely will need SNF  if she survives the hospital stay     DVT prophylaxis:  apixaban (ELIQUIS) tablet 2.5 mg Start: 09/10/21 2200 SCDs Start: 08/24/2021 2332 apixaban (ELIQUIS) tablet 2.5 mg    Code Status: Full Code  Consultants: None Level of Care: Level of care: Stepdown Disposition Plan:  Status is: Inpatient Remains inpatient appropriate because: Ongoing treatment of infiltrates and hypoxia  Objective:   Vitals:   09/13/21 0900 09/13/21 0910 09/13/21 1000 09/13/21 1100  BP: (!) 188/42 (!) 188/42 (!) 179/34 (!) 178/82  Pulse: 73 73 74 77  Resp: 17  18 (!) 25  Temp:      TempSrc:      SpO2: 97%  100% 100%  Weight:      Height:       Filed Weights   09/09/21 0500 09/10/21 0401 09/13/21 0500  Weight: 71.3 kg 68.3 kg 68.5 kg   Exam: General exam: Appears comfortable  HEENT: oral mucosa moist Respiratory system: diffuse b/l crackles Cardiovascular system: S1 & S2 heard  Gastrointestinal system: Abdomen soft, non-tender, nondistended. Normal bowel sounds   Extremities: No cyanosis, clubbing or edema   Imaging and lab data was personally reviewed    CBC: Recent Labs  Lab 09/07/21 0431 09/08/21 0416 09/09/21 0806 09/10/21 0751 09/11/21 0259  WBC 15.5* 19.1* 18.8* 14.2* 18.1*  NEUTROABS 14.0* 16.7*  --   --   --   HGB 8.0* 8.2* 7.9* 7.8* 7.4*  HCT 25.7* 28.0* 26.6* 26.1* 25.6*  MCV 83.7 86.2 87.2 85.6 88.9  PLT 334 366 321 319 324    Basic Metabolic Panel: Recent Labs  Lab 09/07/21 0431 09/08/21 0416 09/10/21 0751 09/11/21 0259 09/12/21 0331 09/13/21 0250  NA 138 140 141 146* 143 143  K 3.5 3.8 4.3 3.8 3.8 3.7  CL 102 99 103 109 108 109  CO2 '26 27 25 26 28 26  '$ GLUCOSE 283* 255* 216* 234* 217* 301*  BUN 53* 58* 55* 62* 73* 90*  CREATININE 1.90* 1.99* 1.68* 1.79* 1.90* 2.12*  CALCIUM 8.9 9.2 8.3* 8.5* 8.2* 8.3*  MG 2.3 2.2  --   --   --   --   PHOS 3.9 3.8  --   --   --   --     GFR: Estimated Creatinine Clearance: 16.1 mL/min (A) (by C-G formula based on SCr of 2.12  mg/dL (H)).  Scheduled Meds:  apixaban  2.5 mg Oral BID   Chlorhexidine Gluconate Cloth  6 each Topical Daily   clopidogrel  75 mg Oral Daily   memantine  28 mg Oral QHS   And   donepezil  10 mg Oral QHS   DULoxetine  60 mg Oral Daily   empagliflozin  10 mg Oral Daily   ezetimibe  10 mg Oral Daily   furosemide  40 mg Oral Daily   gabapentin  300 mg Oral BID   insulin aspart  0-15 Units Subcutaneous TID WC   insulin glargine-yfgn  30 Units Subcutaneous Daily  isosorbide dinitrate  30 mg Oral BID   linagliptin  5 mg Oral Daily   methylPREDNISolone (SOLU-MEDROL) injection  60 mg Intravenous Q8H   metoprolol succinate  12.5 mg Oral Daily   mirabegron ER  25 mg Oral Daily   mouth rinse  15 mL Mouth Rinse 4 times per day   pantoprazole  40 mg Oral Daily   rosuvastatin  10 mg Oral QAC supper   sodium chloride flush  3 mL Intravenous Q12H   Continuous Infusions:  sodium chloride Stopped (09/12/21 1439)   ceFEPime (MAXIPIME) IV Stopped (09/12/21 1839)     LOS: 10 days   Author: Debbe Odea  09/13/2021 11:55 AM

## 2021-09-13 NOTE — Progress Notes (Signed)
SLP Cancellation Note  Patient Details Name: Linda Oliver MRN: [id number] DOB: 02/10/1932   Cancelled treatment:       Reason Eval/Treat Not Completed: Medical issues which prohibited therapy;Other (comment) (patient on HFNC and NRB when not on BiPAP. SLP will f/u for readiness for MBS next date)   Sonia Baller, MA, CCC-SLP Speech Therapy

## 2021-09-13 NOTE — Progress Notes (Signed)
Subjective:  Patient is feeling better and states that her strength is improved.  Her diet has been advanced to thick liquid diets.     Intake/Output from previous day:  I/O last 3 completed shifts: In: 285.4 [I.V.:189.9; IV Piggyback:95.5] Out: 2000 [Urine:2000] No intake/output data recorded. Net IO Since Admission: -3,495.42 mL [09/13/21 0850]  Blood pressure (!) 174/31, pulse 71, temperature 97.6 F (36.4 C), temperature source Axillary, resp. rate 17, height 5' (1.524 m), weight 68.5 kg, SpO2 96 %. Physical Exam Vitals reviewed.  Constitutional:      Appearance: She is well-developed.  Neck:     Vascular: No JVD.  Cardiovascular:     Rate and Rhythm: Normal rate and regular rhythm.     Pulses: Intact distal pulses.          Carotid pulses are  on the right side with bruit and  on the left side with bruit.      Radial pulses are 2+ on the right side and 2+ on the left side.       Dorsalis pedis pulses are 1+ on the right side and 1+ on the left side.       Posterior tibial pulses are 1+ on the right side and 2+ on the left side.     Heart sounds: Murmur heard.     Early systolic murmur is present with a grade of 2/6 at the upper right sternal border.     No gallop.  Pulmonary:     Effort: Pulmonary effort is normal. No accessory muscle usage or respiratory distress.     Breath sounds: Examination of the right-lower field reveals rales. Examination of the left-lower field reveals rales. Decreased breath sounds and rales present. No wheezing or rhonchi.  Abdominal:     General: Abdomen is flat.     Palpations: Abdomen is soft.  Musculoskeletal:     Right shoulder: Normal.     Right lower leg: No edema.     Left lower leg: No edema.     Lab Results:  Recent Labs    09/07/2021 1250 09/05/21 0728 09/13/21 0250  BNP 417.3* 849.9* 335.2*    Lab Results  Component Value Date   NA 143 09/13/2021   K 3.7 09/13/2021   CO2 26 09/13/2021   GLUCOSE 301 (H) 09/13/2021    BUN 90 (H) 09/13/2021   CREATININE 2.12 (H) 09/13/2021   CALCIUM 8.3 (L) 09/13/2021   GFRNONAA 22 (L) 09/13/2021       Latest Ref Rng & Units 09/13/2021    2:50 AM 09/12/2021    3:31 AM 09/11/2021    2:59 AM  BMP  Glucose 70 - 99 mg/dL 301  217  234   BUN 8 - 23 mg/dL 90  73  62   Creatinine 0.44 - 1.00 mg/dL 2.12  1.90  1.79   Sodium 135 - 145 mmol/L 143  143  146   Potassium 3.5 - 5.1 mmol/L 3.7  3.8  3.8   Chloride 98 - 111 mmol/L 109  108  109   CO2 22 - 32 mmol/L $RemoveB'26  28  26   '[id number]$ Calcium 8.9 - 10.3 mg/dL 8.3  8.2  8.5       Latest Ref Rng & Units 09/08/2021    4:16 AM 09/07/2021    4:31 AM 09/06/2021   12:43 AM  Hepatic Function  Total Protein 6.5 - 8.1 g/dL 6.4  6.2  5.9   Albumin 3.5 - 5.0 g/dL  2.3  2.3  2.3   AST 15 - 41 U/L 40  43  53   ALT 0 - 44 U/L 47  47  53   Alk Phosphatase 38 - 126 U/L 146  141  125   Total Bilirubin 0.3 - 1.2 mg/dL 0.9  0.8  1.0       Latest Ref Rng & Units 09/11/2021    2:59 AM 09/10/2021    7:51 AM 09/09/2021    8:06 AM  CBC  WBC 4.0 - 10.5 K/uL 18.1  14.2  18.8   Hemoglobin 12.0 - 15.0 g/dL 7.4  7.8  7.9   Hematocrit 36.0 - 46.0 % 25.6  26.1  26.6   Platelets 150 - 400 K/uL 359  319  321    Lipid Panel     Component Value Date/Time   CHOL 81 07/09/2021 0705   TRIG 76 07/09/2021 0705   HDL 31 (L) 07/09/2021 0705   CHOLHDL 2.6 07/09/2021 0705   VLDL 15 07/09/2021 0705   LDLCALC 35 07/09/2021 0705   HEMOGLOBIN A1C Lab Results  Component Value Date   HGBA1C 7.8 (H) 07/07/2021   MPG 177.16 07/07/2021   TSH Recent Labs    07/08/21 1547  TSH 1.097    Imaging: Imaging results have been reviewed  Cardiac Studies:  Scheduled Meds:  apixaban  2.5 mg Oral BID   Chlorhexidine Gluconate Cloth  6 each Topical Daily   clopidogrel  75 mg Oral Daily   memantine  28 mg Oral QHS   And   donepezil  10 mg Oral QHS   DULoxetine  60 mg Oral Daily   empagliflozin  10 mg Oral Daily   ezetimibe  10 mg Oral Daily   furosemide  40 mg Oral Daily    gabapentin  300 mg Oral BID   insulin aspart  0-15 Units Subcutaneous TID WC   insulin glargine-yfgn  30 Units Subcutaneous Daily   isosorbide dinitrate  30 mg Oral BID   linagliptin  5 mg Oral Daily   methylPREDNISolone (SOLU-MEDROL) injection  60 mg Intravenous Q8H   metoprolol succinate  12.5 mg Oral Daily   mirabegron ER  25 mg Oral Daily   mouth rinse  15 mL Mouth Rinse 4 times per day   pantoprazole  40 mg Oral Daily   rosuvastatin  10 mg Oral QAC supper   sodium chloride flush  3 mL Intravenous Q12H   Continuous Infusions:  sodium chloride Stopped (09/12/21 1439)   ceFEPime (MAXIPIME) IV Stopped (09/12/21 1839)   PRN Meds:.sodium chloride, acetaminophen **OR** acetaminophen, hydrALAZINE, mouth rinse, polyethylene glycol  Cardiac Studies:    Coronary angiogram 1996: [place] 80% stenosis ostial Left main, 30% stenosis proximal LAD, 80% stenosis proximal RCA CABG  -  LIMA to LAD, SVG to int, SVG to [place], [place].  Repeat catheterization recently has revealed widely patent RCA native and occluded SVG to RCA.   Echocardiogram 07/07/2021:   1. Left ventricular ejection fraction, by estimation, is 35 to 40%. The left ventricle has moderately decreased function. The left ventricle demonstrates regional wall motion abnormalities (see scoring diagram/findings for description). There is mild  left ventricular hypertrophy. Left ventricular diastolic parameters are indeterminate. Elevated left ventricular end-diastolic pressure. There is severe of the left ventricular, entire anterior wall. There is mild of the left ventricular, mid-apical  anterolateral wall.  2. Right ventricular systolic function is normal. The right ventricular size is normal. There is normal pulmonary  artery systolic pressure. The estimated right ventricular systolic pressure is 58.5 mmHg.  3. Left atrial size was mildly dilated.  4. The mitral valve is normal in structure. Mild mitral valve  regurgitation.  5. The aortic valve is tricuspid. Aortic valve regurgitation is mild to moderate. Aortic valve sclerosis/calcification is present, without any evidence of aortic stenosis.  6. The inferior vena cava is normal in size with <50% respiratory variability, suggesting right atrial pressure of 8 mmHg.   Comparison(s): Changes from prior study are noted. Compared to 08/23/2018, LVEF 60-65% without regional wall motion abnormality. Mild MR and TR new.  Compared to 11/27/2019, LVEF is 50 to 55%.  There was no wall motion abnormality.  Otherwise no significant change.    Loop recorder [employer] Reveal LINQ  08/24/2018 Remote loop recorder transmission 08/05/2021: Small episodes of atrial fibrillation, AT/AF burden 6.6%.  No change from previous transmission.   EKG:   EKG 08/11/2021: Probably sinus rhythm with marked first-degree AV block at a rate of 56 bpm, right axis deviation, left posterior fascicular block.  Right bundle branch block.  Nonspecific T abnormality.  Trifascicular block.  Compared to 07/28/2021, no significant change.  Assessment   1.  Acute respiratory distress secondary to pulmonary fibrosis.  I am clinically highly suspicious that her presentation is most consistent with amiodarone induced lung toxicity. 07/06/2021, patient was admitted with acute pulmonary edema with infiltrates in her lungs, I am beginning to wonder if this was the beginning of amiodarone toxicity. She had responded to diuresis at that time, there was no follow-up chest x-ray.  She was discharged home on home oxygen that is new to her.  No obvious etiology was felt to be present, although heart failure was felt to be the etiology, normal wondering if this was related to early onset of amiodarone toxicity.  Presently still on high flow oxygen at 15 L/min. 2.  Coronary artery disease of the native vessel without angina pectoris 3.  Chronic systolic heart failure secondary to ischemic cardiomyopathy, euvolumic  presently 4.  Diabetes mellitus with stage IV chronic kidney disease. Slight worsening of the renal function with diuresis. Jardiance and Furosemide added yesterday. 5.  Paroxysmal atrial fibrillation, maintaining sinus rhythm.  Continue anticoagulation. 6. Severe anemia, baseline Hb around 11 gm 2 months ago.   Plan:   Patient now appears well compensated with regard to congestive heart failure.  She is back on Jardiance and also furosemide, oxygen requirements has remained stable, auscultation lung findings appear to be much improved although chest x-ray does not seem to show significant improvement.  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  She is maintaining sinus rhythm with regard to atrial fibrillation.  Chronic kidney disease has mildly worsened with diuresis and also recent sepsis and UTI.  I expect this to improve over time.  With regard to anemia, hemoglobin has trended down.  No obvious source of bleeding.  This could be anemia of acute illness/critical illness.  Otherwise stable from cardiac standpoint, unless her condition changes or she becomes more in respiratory distress or if there is question regarding heart failure versus pulmonary issues, we will be happy to see her back.  For now we will continue to follow her on the sidelines at the request of the primary team.  I have discussed with nursing that although physical therapy has not been able to get her to walk, to see whether she may be able to sit  on the chair during the daytime.   Adrian Prows, MD, [facility] 09/13/2021, 8:50 AM Office: [phone] Fax: [phone] Pager: [phone]

## 2021-09-13 NOTE — Progress Notes (Signed)
Physical Therapy Re-Evaluation Patient Details Name: Linda Oliver MRN: [id number] DOB: [birth year] Today's Date: 09/13/2021  History of Present Illness  Patient is a 88 year old female with medical history significant of diabetes, neuropathy, GERD, obesity, carotid artery disease, CAD status post CABG, CVA, CHF, hypertension, atrial fibrillation, OSA on CPAP, mild cognitive impairment, anemia presenting with altered mental status. The work-up reveals that she has an E. coli bacteremia and will obtain a CT of the chest without contrast to further evaluate for her pneumonia. Patient was transitioned down to ICU on 8/31 with respiratory distress. 9/2 patient was transitioned to heated high flow.   Clinical Impression  Patient being re-evaluated this session since transfer to ICU on 8/31. Patient now with significant weakness in Bil UE/LE and unable to hold extremities part way against gravity for >2 seconds. Pt required Max+2 for bed mobility and to attempt sit<>stands with PT&OT blocking pt's LE's to prevent buckling in stance. She will require 2+ Max/Total assist for safety with mobility and is not at a safe level to transition home. She will benefit from SNF rehab and will need 24/7 care/assist. Acute PT will progress as able in acute setting.     Recommendations for follow up therapy are one component of a multi-disciplinary discharge planning process, led by the attending physician.  Recommendations may be updated based on patient status, additional functional criteria and insurance authorization.  Follow Up Recommendations Skilled nursing-short term rehab (<3 hours/day) Can patient physically be transported by private vehicle: No    Assistance Recommended at Discharge Frequent or constant Supervision/Assistance  Patient can return home with the following  Assistance with cooking/housework;Assistance with feeding;Direct supervision/assist for financial management;Direct supervision/assist for  medications management;Assist for transportation;Help with stairs or ramp for entrance;Two people to help with walking and/or transfers;Two people to help with bathing/dressing/bathroom    Equipment Recommendations None recommended by PT  Recommendations for Other Services       Functional Status Assessment       Precautions / Restrictions Precautions Precautions: Fall Precaution Comments: 2L/min at baseline O2 Restrictions Weight Bearing Restrictions: No      Mobility  Bed Mobility Overal bed mobility: Needs Assistance Bed Mobility: Supine to Sit, Sit to Supine     Supine to sit: Total assist, +2 for safety/equipment, +2 for physical assistance Sit to supine: Total assist, +2 for safety/equipment, +2 for physical assistance   General bed mobility comments: Max+2/Total assist to pivot and raise trunk upright at EOB. Total to return to supine, boost and reposition in chair postirion of bed.    Transfers Overall transfer level: Needs assistance Equipment used: Rolling walker (2 wheels) Transfers: Sit to/from Stand Sit to Stand: Max assist, +2 physical assistance, +2 safety/equipment           General transfer comment: Max +2 with PT/OT blocking bil LE's to prevent knees from buckling with stand. Pt stood 3x from EOB, posture improved and more upright each stand.    Ambulation/Gait                  Stairs            Wheelchair Mobility    Modified Rankin (Stroke Patients Only)       Balance Overall balance assessment: Needs assistance Sitting-balance support: Bilateral upper extremity supported, Feet supported, Feet unsupported Sitting balance-Leahy Scale: Poor   Postural control: Right lateral lean Standing balance support: Bilateral upper extremity supported Standing balance-Leahy Scale: Zero  Pertinent Vitals/Pain Pain Assessment Pain Assessment: No/denies pain    Home Living Family/patient  expects to be discharged to:: Private residence Living Arrangements: Children Available Help at Discharge: Family;Available 24 hours/day Type of Home: House Home Access: Stairs to enter Entrance Stairs-Rails: Can reach both Entrance Stairs-Number of Steps: 3   Home Layout: Two level;Able to live on main level with bedroom/bathroom Home Equipment: Rollator (4 wheels);Cane - quad;Shower seat;Other (comment);BSC/3in1 Additional Comments: Has an apartment built onto her daughters home    Prior Function Prior Level of Function : Needs assist             Mobility Comments: uses a rollator ADLs Comments: Performs ADLs. Daughter assists with IADLs - has required increased assistance this last week     Hand Dominance   Dominant Hand: Right    Extremity/Trunk Assessment   Upper Extremity Assessment Upper Extremity Assessment: RUE deficits/detail;LUE deficits/detail RUE Deficits / Details: patient was unable to hold testing position for any UE movements today. patient was noted to be very quick to fatigue as well 2+ shoulder RUE Coordination: decreased fine motor;decreased gross motor LUE Deficits / Details: patient has limited strength to be able to preform movements requested. patient can make fist with no strength behind it. unable to hold any testing positions with UE.2+ shoulder LUE Coordination: decreased gross motor;decreased fine motor    Lower Extremity Assessment Lower Extremity Assessment: Defer to PT evaluation    Cervical / Trunk Assessment Cervical / Trunk Assessment: Kyphotic  Communication   Communication: HOH  Cognition Arousal/Alertness: Awake/alert Behavior During Therapy: Flat affect Overall Cognitive Status: History of cognitive impairments - at baseline                                 General Comments: pleasant and eager to attempt mobility. pt repeating "I'm so weak"        General Comments      Exercises     Assessment/Plan     PT Assessment    PT Problem List         PT Treatment Interventions      PT Goals (Current goals can be found in the Care Plan section)  Acute Rehab PT Goals Patient Stated Goal: none stated. no family present PT Goal Formulation: Patient unable to participate in goal setting Time For Goal Achievement: 09/27/21    Frequency Min 3X/week     Co-evaluation               AM-PAC PT "6 Clicks" Mobility  Outcome Measure Help needed turning from your back to your side while in a flat bed without using bedrails?: Total Help needed moving from lying on your back to sitting on the side of a flat bed without using bedrails?: Total Help needed moving to and from a bed to a chair (including a wheelchair)?: Total Help needed standing up from a chair using your arms (e.g., wheelchair or bedside chair)?: Total Help needed to walk in hospital room?: Total Help needed climbing 3-5 steps with a railing? : Total 6 Click Score: 6    End of Session Equipment Utilized During Treatment: Gait belt;Oxygen Activity Tolerance: Patient tolerated treatment well (significant weakness) Patient left: in bed;with call bell/phone within reach;with bed alarm set;with family/visitor present (chair position) Nurse Communication: Mobility status PT Visit Diagnosis: Muscle weakness (generalized) (M62.81);Difficulty in walking, not elsewhere classified (R26.2)    Time: 1030-1110 PT Time Calculation (min) (  ACUTE ONLY): 40 min   Charges:   PT Evaluation $PT Re-evaluation: 1 Re-eval PT Treatments $Therapeutic Activity: 8-22 mins        Verner Mould, DPT Acute Rehabilitation Services Office [phone] Pager [phone]  09/13/21 4:29 PM

## 2021-09-14 ENCOUNTER — Inpatient Hospital Stay (HOSPITAL_COMMUNITY): Payer: Medicare Other

## 2021-09-14 DIAGNOSIS — J189 Pneumonia, unspecified organism: Secondary | ICD-10-CM | POA: Diagnosis not present

## 2021-09-14 LAB — BASIC METABOLIC PANEL
Anion gap: 8 (ref 5–15)
BUN: 99 mg/dL — ABNORMAL HIGH (ref 8–23)
CO2: 26 mmol/L (ref 22–32)
Calcium: 8.4 mg/dL — ABNORMAL LOW (ref 8.9–10.3)
Chloride: 112 mmol/L — ABNORMAL HIGH (ref 98–111)
Creatinine, Ser: 2.56 mg/dL — ABNORMAL HIGH (ref 0.44–1.00)
GFR, Estimated: 18 mL/min — ABNORMAL LOW (ref >60)
Glucose, Bld: 170 mg/dL — ABNORMAL HIGH (ref 70–99)
Potassium: 3.5 mmol/L (ref 3.5–5.1)
Sodium: 146 mmol/L — ABNORMAL HIGH (ref 135–145)

## 2021-09-14 LAB — CBC
HCT: 25.1 % — ABNORMAL LOW (ref 36.0–46.0)
Hemoglobin: 7.4 g/dL — ABNORMAL LOW (ref 12.0–15.0)
MCH: 25.3 pg — ABNORMAL LOW (ref 26.0–34.0)
MCHC: 29.5 g/dL — ABNORMAL LOW (ref 30.0–36.0)
MCV: 85.7 fL (ref 80.0–100.0)
Platelets: 329 10*3/uL (ref 150–400)
RBC: 2.93 MIL/uL — ABNORMAL LOW (ref 3.87–5.11)
RDW: 17.6 % — ABNORMAL HIGH (ref 11.5–15.5)
WBC: 21.4 10*3/uL — ABNORMAL HIGH (ref 4.0–10.5)
nRBC: 0.2 % (ref 0.0–0.2)

## 2021-09-14 LAB — GLUCOSE, CAPILLARY
Glucose-Capillary: 165 mg/dL — ABNORMAL HIGH (ref 70–99)
Glucose-Capillary: 167 mg/dL — ABNORMAL HIGH (ref 70–99)
Glucose-Capillary: 165 mg/dL — ABNORMAL HIGH (ref 70–99)
Glucose-Capillary: 147 mg/dL — ABNORMAL HIGH (ref 70–99)
Glucose-Capillary: 131 mg/dL — ABNORMAL HIGH (ref 70–99)

## 2021-09-14 MED ORDER — POTASSIUM CHLORIDE 10 MEQ/100ML IV SOLN
10.0000 meq | INTRAVENOUS | Status: AC
  Administered 2021-09-14 (×2): 10 meq via INTRAVENOUS
  Filled 2021-09-14 (×2): qty 100

## 2021-09-14 MED ORDER — POTASSIUM CHLORIDE 20 MEQ PO PACK
20.0000 meq | PACK | Freq: Once | ORAL | Status: AC
  Administered 2021-09-14: 20 meq via ORAL
  Filled 2021-09-14: qty 1

## 2021-09-14 MED ORDER — METHYLPREDNISOLONE SODIUM SUCC 125 MG IJ SOLR
120.0000 mg | INTRAMUSCULAR | Status: AC
  Administered 2021-09-15 – 2021-09-16 (×2): 120 mg via INTRAVENOUS
  Filled 2021-09-14 (×2): qty 2

## 2021-09-14 MED ORDER — GABAPENTIN 300 MG PO CAPS: 300.0000 mg | ORAL_CAPSULE | Freq: Every day | ORAL | Status: DC

## 2021-09-14 NOTE — Progress Notes (Signed)
NAME:  Linda Oliver, MRN:  [id number], DOB:  05-06-1932, LOS: 73 ADMISSION DATE:  08/10/2021, CONSULTATION DATE:  8/31 REFERRING MD:  Dr. Wynelle Cleveland, CHIEF COMPLAINT:  Hypoxia   History of Present Illness:  88 year old female with PMH as below, which is significant for  Most recently admitted back in June for CHF exacerbation and Atrial fibrillation. Improved with diuresis and cardioversion respectively. New tx at discharge included amiodarone and oxygen 2 liters. She presented to [facility] ED 8/25 with complaints of weakness and delirium progressive over a five day period. Poor PO intake and urinary output noted as well. Upon arrival to the ED she was noted to be febrile to 100.5 and have what appeared to be multifocal pneumonia on imaging. She was admitted to the hospitalists for CAP treatment including antibiotics and steroids. Blood cultures positive for E. Coli. She continued to improve with antibiotic therapy until 8/31, when she began to develop shortness of breath, which devolved into hypoxia and respiratory distress. She was transferred to ICU and supplemental oxygen was increased. CT chest consistent with multifocal infiltrates. PCCM consulted.   Pertinent  Medical History  DM,  CAD s/p CABG,  CVA with medial left occipital infarct in embolic pattern by MRI in 2020 and loop recorder implanted showed paroxysmal atrial fibrillation.   OSA on CPAP,  PAF on eliquis,  Seizure, and UTI. stage 3a CKD    Significant Hospital Events: Including procedures, antibiotic start and stop dates in addition to other pertinent events   8/25 admit for CAP 8/26 CT chest without contrast: Bilateral multifocal infiltrates 8/27 BC postive for E. Coli.  8/31 tx to ICU in respiratory distress >> bipap 9/1Given Ativan at 8 PM -slept all day, on BiPAP 9/2 switched to heated high flow nasal cannula 70% / 15 L 9/3 on HHFNC 50% / 15 L , advance to dysphagia 2 diet 9/5 modified barium swallow showing moderately  severe dysphagia.   Interim History / Subjective:   Weaned to 2L this morning BiPAP at night Very weak and frail this morning.  Issues swallowing yesterday.   Had MBS this morning limited by lethargy. With limited testing it did appear she has severe oropharyngeal dysphagia.   Objective   Blood pressure (!) 173/38, pulse 69, temperature (!) 97.5 F (36.4 C), temperature source Axillary, resp. rate 14, height 5' (1.524 m), weight 71.8 kg, SpO2 97 %.        Intake/Output Summary (Last 24 hours) at 09/14/2021 1036 Last data filed at 09/14/2021 9983 Gross per 24 hour  Intake 305.98 ml  Output 1100 ml  Net -794.02 ml    Filed Weights   09/10/21 0401 09/13/21 0500 09/14/21 0500  Weight: 68.3 kg 68.5 kg 71.8 kg    Examination: General: Frail elderly female in NAD HENT: [place]/AT, PERRL, no JVD Lungs: Bilateral scattered crackles, no accessory muscle use, no rhonchi Cardiovascular: S1S2 regular, no murmur Abdomen: Soft, non-tender, non-distended.  Extremities: No acute deformity. No edema.  Neuro: Nonfocal, interactive  Labs show normal electrolytes,, rising BUN/creatinine, BNP 335 Chest x-ray 9/4 independently reviewed shows persistent bilateral airspace opacities, interstitial and alveolar  CT chest without contrast 8/31 reviewed progressive multifocal infiltrates  Resolved Hospital Problem list     Assessment & Plan:   Acute hypoxic respiratory failure Chronic respiratory failure -was discharged 7/4 on 2 L of oxygen Multifocal infiltrates:  Infectious vs inflammatory. Has been treated for CAP x 5 days.  Respiratory viral panel negative. Amiodarone toxicity high on  differential since infiltrates started in June. Also seems as though aspiration is a lingering issue. - Supplemental O2 to keep SpO2 > 90% > weaned to 2L today. - Use BiPAP as needed for work of breathing, and QHS (CPAP at home) - Completed 5 days of ceftriaxone and azithromycin, then escalated to cefepime -plan for  total 10 days - Wean solumedrol to '60mg'$  BID - Holding amiodarone - Swallow evaluation ongoing, dysphagia 2 diet, will need MBS at some point  PAF Chronic HFrEF - hold amiodarone  - rest per primary/cardiology who have been consulted.  - hold Lasix since creatinine rising  Acute metabolic encephalopathy Dementia History of occipital CVA -Continue Aricept and Namenda -Avoid CNS depressing medications , somnolence on 9/1 related to Ativan  OSA on CPAP - BiPAP mandatory nightly  DM - per primary  Best Practice (right click and "Reselect all SmartList Selections" daily)   Diet/type: NPO advance diet today DVT prophylaxis: DOAC GI prophylaxis: N/A Lines: N/A Foley:  N/A Code Status:  full code Last date of multidisciplinary goals of care discussion '[ ]'$  9/3 updated son-in-law at bedside, discussed poor prognosis should she need mechanical ventilation given advanced age and comorbidities.  He will discuss with daughter regarding changing to DNR status  Labs   CBC: Recent Labs  Lab 09/08/21 0416 09/09/21 0806 09/10/21 0751 09/11/21 0259 09/14/21 0304  WBC 19.1* 18.8* 14.2* 18.1* 21.4*  NEUTROABS 16.7*  --   --   --   --   HGB 8.2* 7.9* 7.8* 7.4* 7.4*  HCT 28.0* 26.6* 26.1* 25.6* 25.1*  MCV 86.2 87.2 85.6 88.9 85.7  PLT 366 321 319 359 329     Basic Metabolic Panel: Recent Labs  Lab 09/08/21 0416 09/10/21 0751 09/11/21 0259 09/12/21 0331 09/13/21 0250 09/13/21 1334 09/14/21 0304  NA 140   < > 146* 143 143 145 146*  K 3.8   < > 3.8 3.8 3.7 3.5 3.5  CL 99   < > 109 108 109 107 112*  CO2 27   < > '26 28 26 26 26  '$ GLUCOSE 255*   < > 234* 217* 301* 324* 170*  BUN 58*   < > 62* 73* 90* 89* 99*  CREATININE 1.99*   < > 1.79* 1.90* 2.12* 2.43* 2.56*  CALCIUM 9.2   < > 8.5* 8.2* 8.3* 8.8* 8.4*  MG 2.2  --   --   --   --   --   --   PHOS 3.8  --   --   --   --   --   --    < > = values in this interval not displayed.    GFR: Estimated Creatinine Clearance: 13.7  mL/min (A) (by C-G formula based on SCr of 2.56 mg/dL (H)). Recent Labs  Lab 09/09/21 0806 09/10/21 0751 09/11/21 0259 09/14/21 0304  WBC 18.8* 14.2* 18.1* 21.4*     Liver Function Tests: Recent Labs  Lab 09/08/21 0416  AST 40  ALT 47*  ALKPHOS 146*  BILITOT 0.9  PROT 6.4*  ALBUMIN 2.3*    No results for input(s): "LIPASE", "AMYLASE" in the last 168 hours. No results for input(s): "AMMONIA" in the last 168 hours.  ABG    Component Value Date/Time   PHART 7.43 09/09/2021 1549   PCO2ART 47 09/09/2021 1549   PO2ART 69 (L) 09/09/2021 1549   HCO3 31.2 (H) 09/09/2021 1549   TCO2 37 (H) 07/12/2021 1259   O2SAT 96.2 09/09/2021 1549  Coagulation Profile: No results for input(s): "INR", "PROTIME" in the last 168 hours.   Cardiac Enzymes: No results for input(s): "CKTOTAL", "CKMB", "CKMBINDEX", "TROPONINI" in the last 168 hours.  HbA1C: Hgb A1c MFr Bld  Date/Time Value Ref Range Status  07/07/2021 05:25 AM 7.8 (H) 4.8 - 5.6 % Final    Comment:    (NOTE) Pre diabetes:          5.7%-6.4%  Diabetes:              >6.4%  Glycemic control for   <7.0% adults with diabetes   08/23/2018 04:34 AM 8.7 (H) 4.8 - 5.6 % Final    Comment:    (NOTE) Pre diabetes:          5.7%-6.4% Diabetes:              >6.4% Glycemic control for   <7.0% adults with diabetes     CBG: Recent Labs  Lab 09/13/21 1147 09/13/21 1555 09/13/21 2229 09/14/21 0826 09/14/21 0937  GLUCAP 294* 300* 187* 165* 167*       Georgann Housekeeper, AGACNP-BC [facility]  See Amion for personal pager PCCM on call pager [phone] until 7pm. Please call Elink 7p-7a. [phone]  09/14/2021 10:36 AM

## 2021-09-14 NOTE — Progress Notes (Signed)
Pt a&ox4 prior to transport. During barium swallow pt became lethargic. Following commands at times. Unable to answer questions appropriately. O2 sats remained above 96% on 3L [place]. Pt returned to unit. MD Rizwan at bedside. Will continue to monitor.

## 2021-09-14 NOTE — Plan of Care (Signed)
  Problem: Education: Goal: Knowledge of General Education information will improve Description: Including pain rating scale, medication(s)/side effects and non-pharmacologic comfort measures Outcome: Not Progressing   Problem: Health Behavior/Discharge Planning: Goal: Ability to manage health-related needs will improve Outcome: Not Progressing   Problem: Clinical Measurements: Goal: Ability to maintain clinical measurements within normal limits will improve Outcome: Not Progressing Goal: Will remain free from infection Outcome: Not Progressing Goal: Diagnostic test results will improve Outcome: Not Progressing Goal: Respiratory complications will improve Outcome: Not Progressing Goal: Cardiovascular complication will be avoided Outcome: Not Progressing   Problem: Activity: Goal: Risk for activity intolerance will decrease Outcome: Not Progressing   Problem: Nutrition: Goal: Adequate nutrition will be maintained Outcome: Not Progressing   Problem: Coping: Goal: Level of anxiety will decrease Outcome: Not Progressing   Problem: Elimination: Goal: Will not experience complications related to bowel motility Outcome: Not Progressing Goal: Will not experience complications related to urinary retention Outcome: Not Progressing   Problem: Pain Managment: Goal: General experience of comfort will improve Outcome: Not Progressing   Problem: Safety: Goal: Ability to remain free from injury will improve Outcome: Not Progressing   Problem: Skin Integrity: Goal: Risk for impaired skin integrity will decrease Outcome: Not Progressing   Problem: Education: Goal: Ability to demonstrate management of disease process will improve Outcome: Not Progressing Goal: Ability to verbalize understanding of medication therapies will improve Outcome: Not Progressing Goal: Individualized Educational Video(s) Outcome: Not Progressing   Problem: Activity: Goal: Capacity to carry out  activities will improve Outcome: Not Progressing   Problem: Cardiac: Goal: Ability to achieve and maintain adequate cardiopulmonary perfusion will improve Outcome: Not Progressing   Problem: Education: Goal: Ability to describe self-care measures that may prevent or decrease complications (Diabetes Survival Skills Education) will improve Outcome: Not Progressing Goal: Individualized Educational Video(s) Outcome: Not Progressing   Problem: Coping: Goal: Ability to adjust to condition or change in health will improve Outcome: Not Progressing   Problem: Fluid Volume: Goal: Ability to maintain a balanced intake and output will improve Outcome: Not Progressing   Problem: Health Behavior/Discharge Planning: Goal: Ability to identify and utilize available resources and services will improve Outcome: Not Progressing Goal: Ability to manage health-related needs will improve Outcome: Not Progressing   Problem: Metabolic: Goal: Ability to maintain appropriate glucose levels will improve Outcome: Not Progressing   Problem: Nutritional: Goal: Maintenance of adequate nutrition will improve Outcome: Not Progressing Goal: Progress toward achieving an optimal weight will improve Outcome: Not Progressing   Problem: Skin Integrity: Goal: Risk for impaired skin integrity will decrease Outcome: Not Progressing   Problem: Tissue Perfusion: Goal: Adequacy of tissue perfusion will improve Outcome: Not Progressing Patient bed bound unable to make all needs known family at bedside education given patient NPO at this time very lethargic unable to fully complete swallow study mentation in and out.

## 2021-09-14 NOTE — Progress Notes (Addendum)
0900 patient unable to complete swallow AMS and lethargic 1200 patient more alert able to answer questions but still lethargic unsafe to have anything PO at this time [place] discussed with family and patient feeding concerns

## 2021-09-14 NOTE — Progress Notes (Addendum)
eLink Physician-Brief Progress Note Patient Name: Linda Oliver DOB: 11-17-1932 MRN: [id number]   Date of Service  09/14/2021  HPI/Events of Note  Potasium at 3.5, Cr 2.56 , on necator thick consistency diet.   eICU Interventions  Kcl 20 meq packet ordered for now, orally.      Intervention Category Intermediate Interventions: Electrolyte abnormality - evaluation and management  Elmer Sow 09/14/2021, 4:28 AM  6:25 pt unable to take po potassium.  Atempted to take, started to gag, and vomitted.  please change to IV via PIV  Changed to IV Kcl 10 meq q1 hr x 2 doses.

## 2021-09-14 NOTE — Progress Notes (Signed)
Modified Barium Swallow Progress Note  Patient Details  Name: Linda Oliver MRN: [id number] Date of Birth: 1932/04/21  Today's Date: 09/14/2021  Modified Barium Swallow completed.  Full report located under Chart Review in the Imaging Section.  Brief recommendations include the following:  Clinical Impression  Abbreviated test with SLP being able to observe nectar via tsp and cup only showing moderately severe oropharyngeal dysphagia.  Initially pt fully alert, able to verbalize her name and willing to participate in [place].  Only two boluses were swallowed.  Severely limited labial closure noted with pt allowing 90% of boluses to spill anteriorally without awareness.  SLP then administered bolus via cup - essentially dumping a small portion into anterior oral cavity.  A small portion of nectar transited into pharynx with swallow triggering at vallecular space with trace laryngeal penetration due to impaired laryngeal closure.  Pt did not follow directions to cough, clear her throat or re-swallow.  She then began retching ongoing for approximately 3 minutes after 2nd barium swallow.  RN then provided suction to anterior oral cavity.  SLP resumed testing, and pt noted to have sudden AMS during administration attempts.   She did not stay alert despite gentle sternal rubbing and consistent verbal stimulation. Testing was then aborted for pt safety and SLP assisted to get pt back to her ICU room.   SLP left pt in her ICU room with MD, family member and nurses.  Recommend MBS when pt medically stable to participate, will continue efforts.  Shared results of testing with pt family member, MD and RNs.   Swallow Evaluation Recommendations       SLP Diet Recommendations: Other (Comment) (Small boluses of nectar when fully alert)   Liquid Administration via: Cup NO STRAWS   Medication Administration: Via alternative means or with applesauce   Supervision: Full supervision/cueing for compensatory  strategies   Compensations: Slow rate;Small sips/bites   Postural Changes: Remain semi-upright after after feeds/meals (Comment);Seated upright at 90 degrees Assure pt fully alert for all po intake; Have oral suction ready for use as needed    Oral Care Recommendations: Oral care BID      Linda Lime, MS [facility] SLP Acute Rehab Services Office [phone] Pager [phone]    Linda Oliver 09/14/2021,9:49 AM

## 2021-09-14 NOTE — Progress Notes (Signed)
This RN attempted to give patient PO potassium packet, mixed with nectar thick liquid per diet order. Patient started to gag after first spoon full and stated to that she felt nauseous. Some emesis suctioned out of mouth. HFNC on 8L at time and patient O2 saturations dropped to 86. After 5 minutes patient recovered to 98% and HFNC dropped back down to 4L. E-Link notified that patient did not receive the PO potassium. Waiting for new orders.

## 2021-09-14 NOTE — Progress Notes (Signed)
[facility] Progress Note  Patient: Linda Oliver     DHR:[id number]  DOA: 09/09/2021   PCP: Shon Baton, MD       Brief hospital course: This is an 88 year old female with CKD stage IIIb, atrial fibrillation, coronary artery disease status post stenting and CABG, diabetes mellitus type 2, OSA, obesity who presented to the hospital for hallucinations and weakness.  Per family delirium had been increasing for about a week. Her PCP had felt she may have a UTI and she was started on cephalexin the day before coming to the hospital. She was initially admitted for multifocal pneumonia and started on ceftriaxone and azithromycin.  She was later found to have E. coli bacteremia.  She was hospitalized from 6/27 through 07/13/21 for acute CHF, lobar pneumonia and an NSTEMI. She underwent a cath which reveals severe native vessel disease.   8/31- resp distress noted in afternoon- transferred to ICU- Lasix IV produced a good bit of urine but did not seem to help respiration- CT showing increasing pulmonary infiltrates- PCCM consulted- has been on and off BiPAP and Hi flow [place] sinceCeftriaxone was changed to Cefepime and solumedrol (which was being weaned down) was increased by PCCM  9/5> now on 2 L O2 which is significantly better than a few days ago  Subjective:  Was brought back from [facility] while I was in the room. She had no complaints but I noted that she was having difficulty staying awake.    Assessment and Plan: Principal Problem:   Multifocal infiltrates Acute on Chronic hypoxic resp failure- possibly secondary to acute on chronic systolic CHF earlier on in the admission H/o OSA - ? Bacterial pneumonia, ? Amiodarone toxicity  ? Recurrent aspirations  - she was discharged home with 2 L oxygen on 7/4 with was new for her- - also received antibiotics on previous admission and was discharged with Augmentin - EF 35% - defer diuresis to cardiology- Have asked Dr Einar Gip for his team to continue to  follow  - she has completed 5 days of azithromycin and 10 days of Cefepime which was stopped yesterday -  WBC up today...not sure what to make of this as she is also on steroids  -weaned down to 2 L O2 - unable to complete MBS today- was eating yesterday per family but per notes, she had some gagging and vomiting with potassium last night  - uses a C PAP when asleep  Active Problems:   E. coli bacteremia   -  Received 5 days of Cefepime - discontinued yesterday  Acute encephalopathy- h/o dementia - related to acute infection when admitted but had improved considerably- now in in out of alertness but apparently was mostly alert all day yesterday and was eating and drinking - dementia is mild with sundowning at home per family - Continue Aricept and Namenda - discharged home with Seroquel but family states they were hesitant to give this to her and have not been  Paroxysmal atrial fibrillation, h/o CVA in 2020 - Continue metoprolol and Eliquis - discharged with Amio on 7/4- family state she was not longer receiving Amiodarone at home- not sure when this was stopped - has a loop recorder  AKI on CKD stage 3b - baseline Cr in July was ~ 1.3- 1.5 - diuretics per cardiology  - as Cr rising, have consulted pharmacy to renal dose medications  Hypokalemia - Has been replaced  Coronary artery disease -She is receiving Plavix in addition to Eliquis - Continue Isordil,  Zetia and Crestor  Anemia, normocytic - likely related to acute illness - no blood loss noted in the hospital - Hgb ranging from 7-8 during this admission- follow intermittently - Hbg 10-11 in June and July of this year  Diabetes mellitus type 2 - Continue Semglee and sliding scale insulin- - Hyperglycemic due to steroids - Semglee increased 9/4 as sugars rose when oral intake resumed on 9/3  - cont Januvia (replaced by Tradjenta in hospital)  - hold Jardiance in setting of increased Creatinine - cont to hold  Amaryl Hemoglobin A1C    Component Value Date/Time   HGBA1C 7.8 (H) 07/07/2021 0525   H/o b/l carotid artery stenosis - surveillance stopped due to age  [facility] - cannot hear well w/o hearing aid  Deconditioning - PT has recommended SNF- Family prefers [facility] if possible as long as she can transfer from bed to chair-ast this point, she is still quite weak and may need SNF if she survives the hospital stay   I spoke with her son in law today     DVT prophylaxis:  apixaban (ELIQUIS) tablet 2.5 mg Start: 09/10/21 2200 SCDs Start: 08/31/2021 2332 apixaban (ELIQUIS) tablet 2.5 mg    Code Status: Full Code  Consultants: None Level of Care: Level of care: Stepdown Disposition Plan:  Status is: Inpatient Remains inpatient appropriate because: Ongoing treatment of infiltrates and hypoxia  Objective:   Vitals:   09/14/21 0915 09/14/21 1000 09/14/21 1100 09/14/21 1200  BP: (!) 173/38 (!) 176/25 (!) 159/20 (!) 114/23  Pulse: 69 68 68 66  Resp: '14 12 16 13  '$ Temp:      TempSrc:      SpO2: 97% 98% 96% 94%  Weight:      Height:       Filed Weights   09/10/21 0401 09/13/21 0500 09/14/21 0500  Weight: 68.3 kg 68.5 kg 71.8 kg   Exam: General exam: Appears comfortable - very somnolent Respiratory system: RR is rapid- faint crackles heard today Cardiovascular system: S1 & S2 heard  Gastrointestinal system: Abdomen soft, non-tender, nondistended. Normal bowel sounds   Extremities: No cyanosis, clubbing or edema     Imaging and lab data was personally reviewed    CBC: Recent Labs  Lab 09/08/21 0416 09/09/21 0806 09/10/21 0751 09/11/21 0259 09/14/21 0304  WBC 19.1* 18.8* 14.2* 18.1* 21.4*  NEUTROABS 16.7*  --   --   --   --   HGB 8.2* 7.9* 7.8* 7.4* 7.4*  HCT 28.0* 26.6* 26.1* 25.6* 25.1*  MCV 86.2 87.2 85.6 88.9 85.7  PLT 366 321 319 359 332    Basic Metabolic Panel: Recent Labs  Lab 09/08/21 0416 09/10/21 0751 09/11/21 0259 09/12/21 0331 09/13/21 0250  09/13/21 1334 09/14/21 0304  NA 140   < > 146* 143 143 145 146*  K 3.8   < > 3.8 3.8 3.7 3.5 3.5  CL 99   < > 109 108 109 107 112*  CO2 27   < > '26 28 26 26 26  '$ GLUCOSE 255*   < > 234* 217* 301* 324* 170*  BUN 58*   < > 62* 73* 90* 89* 99*  CREATININE 1.99*   < > 1.79* 1.90* 2.12* 2.43* 2.56*  CALCIUM 9.2   < > 8.5* 8.2* 8.3* 8.8* 8.4*  MG 2.2  --   --   --   --   --   --   PHOS 3.8  --   --   --   --   --   --    < > =  values in this interval not displayed.    GFR: Estimated Creatinine Clearance: 13.7 mL/min (A) (by C-G formula based on SCr of 2.56 mg/dL (H)).  Scheduled Meds:  apixaban  2.5 mg Oral BID   Chlorhexidine Gluconate Cloth  6 each Topical Daily   clopidogrel  75 mg Oral Daily   memantine  28 mg Oral QHS   And   donepezil  10 mg Oral QHS   DULoxetine  60 mg Oral Daily   ezetimibe  10 mg Oral Daily   furosemide  40 mg Oral Daily   [START ON 09/15/2021] gabapentin  300 mg Oral Daily   insulin aspart  0-15 Units Subcutaneous TID WC   insulin glargine-yfgn  30 Units Subcutaneous Daily   isosorbide dinitrate  30 mg Oral BID   linagliptin  5 mg Oral Daily   [START ON 09/15/2021] methylPREDNISolone (SOLU-MEDROL) injection  120 mg Intravenous Q24H   metoprolol succinate  12.5 mg Oral Daily   mirabegron ER  25 mg Oral Daily   mouth rinse  15 mL Mouth Rinse 4 times per day   pantoprazole  40 mg Oral Daily   rosuvastatin  10 mg Oral QAC supper   sodium chloride flush  3 mL Intravenous Q12H   Continuous Infusions:  sodium chloride Stopped (09/14/21 0823)     LOS: 11 days   Author: Debbe Odea  09/14/2021 1:43 PM

## 2021-09-15 ENCOUNTER — Inpatient Hospital Stay (HOSPITAL_COMMUNITY): Payer: Medicare Other

## 2021-09-15 DIAGNOSIS — N179 Acute kidney failure, unspecified: Secondary | ICD-10-CM | POA: Diagnosis not present

## 2021-09-15 DIAGNOSIS — J189 Pneumonia, unspecified organism: Secondary | ICD-10-CM | POA: Diagnosis not present

## 2021-09-15 DIAGNOSIS — B9620 Unspecified Escherichia coli [E. coli] as the cause of diseases classified elsewhere: Secondary | ICD-10-CM

## 2021-09-15 DIAGNOSIS — I5023 Acute on chronic systolic (congestive) heart failure: Secondary | ICD-10-CM | POA: Diagnosis not present

## 2021-09-15 DIAGNOSIS — R7881 Bacteremia: Secondary | ICD-10-CM

## 2021-09-15 DIAGNOSIS — J9601 Acute respiratory failure with hypoxia: Secondary | ICD-10-CM | POA: Diagnosis not present

## 2021-09-15 LAB — GLUCOSE, CAPILLARY
Glucose-Capillary: 166 mg/dL — ABNORMAL HIGH (ref 70–99)
Glucose-Capillary: 168 mg/dL — ABNORMAL HIGH (ref 70–99)
Glucose-Capillary: 196 mg/dL — ABNORMAL HIGH (ref 70–99)
Glucose-Capillary: 245 mg/dL — ABNORMAL HIGH (ref 70–99)
Glucose-Capillary: 254 mg/dL — ABNORMAL HIGH (ref 70–99)

## 2021-09-15 LAB — MAGNESIUM: Magnesium: 2.8 mg/dL — ABNORMAL HIGH (ref 1.7–2.4)

## 2021-09-15 LAB — PHOSPHORUS: Phosphorus: 4 mg/dL (ref 2.5–4.6)

## 2021-09-15 MED ORDER — MORPHINE SULFATE (PF) 2 MG/ML IV SOLN
INTRAVENOUS | Status: AC
  Administered 2021-09-15: 1 mg via INTRAVENOUS
  Filled 2021-09-15: qty 1

## 2021-09-15 MED ORDER — ISOSORBIDE DINITRATE 20 MG PO TABS
30.0000 mg | ORAL_TABLET | Freq: Two times a day (BID) | ORAL | Status: DC
  Administered 2021-09-15 – 2021-09-16 (×2): 30 mg
  Filled 2021-09-15 (×3): qty 1

## 2021-09-15 MED ORDER — CLOPIDOGREL BISULFATE 75 MG PO TABS
75.0000 mg | ORAL_TABLET | Freq: Every day | ORAL | Status: DC
Start: 2021-09-16 — End: 2021-09-16

## 2021-09-15 MED ORDER — OSMOLITE 1.5 CAL PO LIQD
1000.0000 mL | ORAL | Status: DC
  Administered 2021-09-15: 1000 mL
  Filled 2021-09-15 (×2): qty 1000

## 2021-09-15 MED ORDER — ACETAMINOPHEN 325 MG PO TABS: 650.0000 mg | ORAL_TABLET | Freq: Four times a day (QID) | ORAL | Status: DC | PRN

## 2021-09-15 MED ORDER — ACETAMINOPHEN 650 MG RE SUPP
650.0000 mg | Freq: Four times a day (QID) | RECTAL | Status: DC | PRN
Start: 2021-09-15 — End: 2021-09-16

## 2021-09-15 MED ORDER — EZETIMIBE 10 MG PO TABS
10.0000 mg | ORAL_TABLET | Freq: Every day | ORAL | Status: DC
  Administered 2021-09-16: 10 mg

## 2021-09-15 MED ORDER — FOLIC ACID 1 MG PO TABS
1.0000 mg | ORAL_TABLET | Freq: Every day | ORAL | Status: DC
Start: 2021-09-15 — End: 2021-09-16
  Administered 2021-09-15 – 2021-09-16 (×2): 1 mg via ORAL
  Filled 2021-09-15 (×2): qty 1

## 2021-09-15 MED ORDER — METHYLPREDNISOLONE SODIUM SUCC 40 MG IJ SOLR: 40.0000 mg | Freq: Every day | INTRAMUSCULAR | Status: DC

## 2021-09-15 MED ORDER — MORPHINE SULFATE (PF) 2 MG/ML IV SOLN
1.0000 mg | INTRAVENOUS | Status: DC | PRN
  Administered 2021-09-15 – 2021-09-16 (×3): 1 mg via INTRAVENOUS
  Filled 2021-09-15 (×3): qty 1

## 2021-09-15 MED ORDER — DONEPEZIL HCL 10 MG PO TABS
10.0000 mg | ORAL_TABLET | Freq: Every day | ORAL | Status: DC
  Administered 2021-09-15: 10 mg
  Filled 2021-09-15: qty 1

## 2021-09-15 MED ORDER — APIXABAN 2.5 MG PO TABS
2.5000 mg | ORAL_TABLET | Freq: Two times a day (BID) | ORAL | Status: DC
Start: 2021-09-15 — End: 2021-09-16
  Administered 2021-09-15 – 2021-09-16 (×2): 2.5 mg
  Filled 2021-09-15 (×2): qty 1

## 2021-09-15 MED ORDER — PANTOPRAZOLE 2 MG/ML SUSPENSION
40.0000 mg | Freq: Every day | ORAL | Status: DC
  Administered 2021-09-15 – 2021-09-16 (×2): 40 mg
  Filled 2021-09-15 (×2): qty 20

## 2021-09-15 MED ORDER — ROSUVASTATIN CALCIUM 10 MG PO TABS
10.0000 mg | ORAL_TABLET | Freq: Every day | ORAL | Status: DC
Start: 2021-09-15 — End: 2021-09-16
  Administered 2021-09-15: 10 mg
  Filled 2021-09-15: qty 1

## 2021-09-15 MED ORDER — MEMANTINE HCL ER 28 MG PO CP24
28.0000 mg | ORAL_CAPSULE | Freq: Every day | ORAL | Status: DC
  Filled 2021-09-15 (×2): qty 1

## 2021-09-15 MED ORDER — ADULT MULTIVITAMIN LIQUID CH
15.0000 mL | Freq: Every day | ORAL | Status: DC
  Administered 2021-09-15 – 2021-09-16 (×2): 15 mL
  Filled 2021-09-15 (×2): qty 15

## 2021-09-15 MED ORDER — LINAGLIPTIN 5 MG PO TABS
5.0000 mg | ORAL_TABLET | Freq: Every day | ORAL | Status: DC
  Administered 2021-09-16: 5 mg
  Filled 2021-09-15: qty 1

## 2021-09-15 MED ORDER — POLYETHYLENE GLYCOL 3350 17 G PO PACK
17.0000 g | PACK | Freq: Every day | ORAL | Status: DC | PRN
Start: 2021-09-15 — End: 2021-09-16

## 2021-09-15 MED ORDER — ADULT MULTIVITAMIN W/MINERALS CH: 1.0000 | ORAL_TABLET | Freq: Every day | ORAL | Status: DC

## 2021-09-15 NOTE — Progress Notes (Signed)
Not on BiPAP at this time. Family making EOL decisions and patient is now DNR, moving toward total comfort.

## 2021-09-15 NOTE — Progress Notes (Signed)
SLP Cancellation Note  Patient Details Name: Linda Oliver MRN: [id number] DOB: 1932-04-04   Cancelled treatment:       Reason Eval/Treat Not Completed: Other (comment) (note pt did not tolerate po yesterday, now has small bore feeding tube; recommend npo; will continue efforts)  Kathleen Lime, MS [facility] SLP Acute Rehab Services Office [phone] Pager [phone]   Macario Golds 09/15/2021, 11:28 AM

## 2021-09-15 NOTE — Progress Notes (Signed)
Small bore NG tube placed at 1020 by this nurse. STAT abd xray ordered for placement verification. Currently tube is placed through the left nare to 60cm mark and taped firmly to the nose. Small amount of bleeding noted aroudn the tube, per family patient has nose bleeds frequently.

## 2021-09-15 NOTE — Progress Notes (Signed)
Dietary requesting secondary notification of NG placement due to wording of radiology report. Dr. Starla Link verified abd xray and placement of NG tube is appropriate to begin tube feeds, and medications. Will continue to assess for change.

## 2021-09-15 NOTE — Progress Notes (Signed)
PT Cancellation Note  Patient Details Name: Linda Oliver MRN: [id number] DOB: 1932-11-03   Cancelled Treatment:    Reason Eval/Treat Not Completed: Patient not medically ready. Per RN, pt undergoing testing to identify cause of sudden lethargic state. Will hold PT until appropriate.    Tori Helen Cuff PT, DPT 09/15/21, 10:15 AM

## 2021-09-15 NOTE — Progress Notes (Signed)
Change in first con\\act in case of change in patient status, will put in permanent chart.  [name] [phone] (son in law)

## 2021-09-15 NOTE — Progress Notes (Signed)
Chaplain engaged in an initial visit with Lai's daughter and son-in-law outside of her room.  Daughter was experiencing a number of emotions in having made some hard decisions concerning her mom.  Chaplain affirmed the love and care daughter was showing to her mom through making those tough decisions.  Chaplain was able to spend time getting to know [facility] through them.  They shared that Fletcher is originally from [place] but because her husband was in the service, they traveled a lot and lived in places like [place] and [place].  They described Shamicka as being very "sassy," vibrant, full of life, still current and well connected.  Lachell met her boyfriend a little over a year ago through online dating.  They traveled and spent time together throughout the year.   Chaplain offered listening, presence, and support.    09/15/21 0900  Clinical Encounter Type  Visited With Family  Visit Type Initial;Spiritual support

## 2021-09-15 NOTE — Progress Notes (Signed)
PROGRESS NOTE    Linda Oliver  HUT:[id number] DOB: [birth year] DOA: 08/29/2021 PCP: Shon Baton, MD   Brief Narrative:  88 year old female with CKD stage IIIb, atrial fibrillation, coronary artery disease status post stenting and CABG, diabetes mellitus type 2, OSA, obesity, recent hospitalization from 07/06/2021-07/13/2021 for acute CHF/lower pneumonia and non-STEMI for which underwent cardiac catheterization which showed severe native vessel disease presented to the hospital for hallucinations and weakness.  On presentation, she was found to have multifocal pneumonia and started on Rocephin and Zithromax.  She was subsequently found to have E. coli bacteremia.  On 09/09/2021, she was found to be in respiratory distress: Transferred to stepdown; given IV Lasix.  CT showed increasing pulmonary infiltrates.  PCCM consulted.  Patient required BiPAP and high flow nasal cannula oxygen.  Antibiotics changed to cefepime and Solu-Medrol dose increased by PCCM.  Assessment & Plan:   Acute on chronic hypoxic respiratory failure Multifocal infiltrates:?  Bacterial pneumonia/amiodarone toxicity/recurrent aspirations Acute on chronic systolic CHF History of OSA -She was discharged home with 2 L oxygen on 07/13/2021 -During the hospitalization, she has required increased amounts of oxygen including BiPAP and high flow nasal cannula.  PCCM following.  Currently on 3 L oxygen via nasal cannula. -Has completed 10-day course of cefepime and 5-day course of Zithromax -EF 35%.  Patient was treated with diuretics but currently diuretics on hold.  Continue metoprolol and isosorbide dinitrate.  Strict input and output and daily weights. -Oral intake poor.  SLP following.  Diet as per SLP recommendations.  Acute metabolic encephalopathy History of dementia Goals of care Physical deconditioning -Apparently mental status has gotten worse over the last 2 days.  CT of the head on presentation was negative for acute  abnormality.  We will repeat a CT of the head without contrast.  Monitor mental status.  DC gabapentin.  Continue with memantine and donepezil. -Might need cortrak placement and feeding. -Palliative care consultation for goals of care discussion.  CODE STATUS changed to DNR after discussion with daughter and son-in-law at bedside.  E. coli bacteremia -Has completed course of cefepime.  Paroxysmal A-fib -Rate controlled.  Continue metoprolol and Eliquis.  Discharged with amiodarone on 7/23: Currently not on amiodarone.  Has a loop recorder  AKI on CKD stage IIIb -Creatinine 1.3-1.5 at baseline.  Diuretics currently on hold.  Creatinine 2.56 on 09/14/2021.  Monitor  CAD -Currently on apixaban, Evex, Crestor, Zetia, metoprolol succinate and isosorbide dinitrate.  Follow cardiology recommendations  Normocytic anemia/anemia of chronic disease -From chronic basis.  Hemoglobin 7.4 on 09/14/2021.  Repeat a.m. labs.  Leukocytosis -monitor  History of bilateral carotid artery stenosis -Surveillance stopped due to age  Hard of hearing -Cannot hear well without hearing aid   DVT prophylaxis: Eliquis Code Status: CODE STATUS changed to DNR today Family Communication: Daughter and son-in-law at bedside Disposition Plan: Status is: Inpatient Remains inpatient appropriate because: Of severity of illness  Consultants: PCCM/cardiology.  Consult palliative care  Procedures: None  Antimicrobials:  Anti-infectives (From admission, onward)    Start     Dose/Rate Route Frequency Ordered Stop   09/13/21 1800  ceFEPIme (MAXIPIME) 2 g in sodium chloride 0.9 % 100 mL IVPB       Note to Pharmacy: Last dose today   2 g 200 mL/hr over 30 Minutes Intravenous Daily-1800 09/13/21 1201 09/13/21 1804   09/09/21 2000  ceFEPIme (MAXIPIME) 2 g in sodium chloride 0.9 % 100 mL IVPB  Status:  Discontinued  2 g 200 mL/hr over 30 Minutes Intravenous Daily-1800 09/09/21 1919 09/13/21 1201   09/04/21 1400   azithromycin (ZITHROMAX) 500 mg in sodium chloride 0.9 % 250 mL IVPB  Status:  Discontinued        500 mg 250 mL/hr over 60 Minutes Intravenous Every 24 hours 09/04/21 1126 09/04/21 1129   09/04/21 1400  cefTRIAXone (ROCEPHIN) 2 g in sodium chloride 0.9 % 100 mL IVPB  Status:  Discontinued        2 g 200 mL/hr over 30 Minutes Intravenous Every 24 hours 09/04/21 1128 09/09/21 1836   09/04/21 1215  azithromycin (ZITHROMAX) tablet 500 mg        500 mg Oral Daily 09/04/21 1129 09/07/21 0925   08/17/2021 1445  cefTRIAXone (ROCEPHIN) 2 g in sodium chloride 0.9 % 100 mL IVPB  Status:  Discontinued        2 g 200 mL/hr over 30 Minutes Intravenous Every 24 hours 08/16/2021 1440 09/04/21 1128   08/30/2021 1445  azithromycin (ZITHROMAX) 500 mg in sodium chloride 0.9 % 250 mL IVPB  Status:  Discontinued        500 mg 250 mL/hr over 60 Minutes Intravenous Every 24 hours 08/31/2021 1440 09/04/21 1126        Subjective: Patient seen and examined at bedside.  Extremely slow to respond.  Poor historian.  Daughter at bedside.  No fever, vomiting, agitation, seizures reported.  Objective: Vitals:   09/15/21 0841 09/15/21 0900 09/15/21 1000 09/15/21 1100  BP:  (!) 184/29 (!) 185/32 (!) 190/33  Pulse: 77 78 79 79  Resp:  '18 16 16  '$ Temp:      TempSrc:      SpO2:  93% 91% 93%  Weight:      Height:        Intake/Output Summary (Last 24 hours) at 09/15/2021 1324 Last data filed at 09/15/2021 0500 Gross per 24 hour  Intake --  Output 850 ml  Net -850 ml   Filed Weights   09/13/21 0500 09/14/21 0500 09/15/21 0504  Weight: 68.5 kg 71.8 kg 56.9 kg    Examination:  General exam: Appears calm and comfortable looks chronically ill and deconditioned.  Currently on 3 L oxygen via nasal cannula. Respiratory system: Bilateral decreased breath sounds at bases with some scattered crackles Cardiovascular system: S1 & S2 heard, Rate controlled Gastrointestinal system: Abdomen is nondistended, soft and nontender.  Normal bowel sounds heard. Extremities: No cyanosis, clubbing; trace lower extremity edema Central nervous system: Drowsy, wakes up slightly.  Does not follow commands much.  No focal neurological deficits. Moving extremities Skin: No rashes, lesions or ulcers Psychiatry: Could not be assessed because of mental status.  Currently not agitated.   Data Reviewed: I have personally reviewed following labs and imaging studies  CBC: Recent Labs  Lab 09/09/21 0806 09/10/21 0751 09/11/21 0259 09/14/21 0304  WBC 18.8* 14.2* 18.1* 21.4*  HGB 7.9* 7.8* 7.4* 7.4*  HCT 26.6* 26.1* 25.6* 25.1*  MCV 87.2 85.6 88.9 85.7  PLT 321 319 359 921   Basic Metabolic Panel: Recent Labs  Lab 09/11/21 0259 09/12/21 0331 09/13/21 0250 09/13/21 1334 09/14/21 0304  NA 146* 143 143 145 146*  K 3.8 3.8 3.7 3.5 3.5  CL 109 108 109 107 112*  CO2 '26 28 26 26 26  '$ GLUCOSE 234* 217* 301* 324* 170*  BUN 62* 73* 90* 89* 99*  CREATININE 1.79* 1.90* 2.12* 2.43* 2.56*  CALCIUM 8.5* 8.2* 8.3* 8.8* 8.4*   GFR:  Estimated Creatinine Clearance: 12.2 mL/min (A) (by C-G formula based on SCr of 2.56 mg/dL (H)). Liver Function Tests: No results for input(s): "AST", "ALT", "ALKPHOS", "BILITOT", "PROT", "ALBUMIN" in the last 168 hours. No results for input(s): "LIPASE", "AMYLASE" in the last 168 hours. No results for input(s): "AMMONIA" in the last 168 hours. Coagulation Profile: No results for input(s): "INR", "PROTIME" in the last 168 hours. Cardiac Enzymes: No results for input(s): "CKTOTAL", "CKMB", "CKMBINDEX", "TROPONINI" in the last 168 hours. BNP (last 3 results) No results for input(s): "PROBNP" in the last 8760 hours. HbA1C: No results for input(s): "HGBA1C" in the last 72 hours. CBG: Recent Labs  Lab 09/14/21 1208 09/14/21 1621 09/14/21 2104 09/15/21 0825 09/15/21 1219  GLUCAP 165* 147* 131* 196* 166*   Lipid Profile: No results for input(s): "CHOL", "HDL", "LDLCALC", "TRIG", "CHOLHDL",  "LDLDIRECT" in the last 72 hours. Thyroid Function Tests: No results for input(s): "TSH", "T4TOTAL", "FREET4", "T3FREE", "THYROIDAB" in the last 72 hours. Anemia Panel: No results for input(s): "VITAMINB12", "FOLATE", "FERRITIN", "TIBC", "IRON", "RETICCTPCT" in the last 72 hours. Sepsis Labs: No results for input(s): "PROCALCITON", "LATICACIDVEN" in the last 168 hours.  Recent Results (from the past 240 hour(s))  Culture, blood (Routine X 2) w Reflex to ID Panel     Status: None   Collection Time: 09/06/21 12:43 AM   Specimen: BLOOD  Result Value Ref Range Status   Specimen Description   Final    BLOOD BLOOD RIGHT ARM Performed at [facility] [address], [place], [place] [address]    Special Requests   Final    BOTTLES DRAWN AEROBIC ONLY Blood Culture adequate volume Performed at [facility] [address], [place], [place] [address]    Culture   Final    NO GROWTH 5 DAYS Performed at [facility], [place] [address] [place], [place] [address]    Report Status 09/11/2021 FINAL  Final  Culture, blood (Routine X 2) w Reflex to ID Panel     Status: None   Collection Time: 09/06/21 12:48 AM   Specimen: BLOOD  Result Value Ref Range Status   Specimen Description   Final    BLOOD RIGHT ANTECUBITAL Performed at [facility] [address], [place], [place] [address]    Special Requests   Final    BOTTLES DRAWN AEROBIC ONLY Blood Culture adequate volume Performed at [facility] [address], [place], [place] [address]    Culture   Final    NO GROWTH 5 DAYS Performed at [facility], [place] [address], [place], [place] [address]    Report Status 09/11/2021 FINAL  Final  MRSA Next Gen by PCR, Nasal     Status: None   Collection Time: 09/09/21 10:54 PM   Specimen: Nasal Mucosa; Nasal Swab  Result Value Ref Range Status   MRSA by PCR Next Gen NOT DETECTED NOT DETECTED Final    Comment:  (NOTE) The GeneXpert MRSA Assay (FDA approved for NASAL specimens only), is one component of a comprehensive MRSA colonization surveillance program. It is not intended to diagnose MRSA infection nor to guide or monitor treatment for MRSA infections. Test performance is not FDA approved in patients less than 46 years old. Performed at [facility], [place] [address], [place], [place] [address]   Respiratory (~20 pathogens) panel by PCR     Status: None   Collection Time: 09/09/21 10:54 PM   Specimen:  Nasopharyngeal Swab; Respiratory  Result Value Ref Range Status   Adenovirus NOT DETECTED NOT DETECTED Final   Coronavirus 229E NOT DETECTED NOT DETECTED Final    Comment: (NOTE) The Coronavirus on the Respiratory Panel, DOES NOT test for the novel  Coronavirus (2019 nCoV)    Coronavirus HKU1 NOT DETECTED NOT DETECTED Final   Coronavirus NL63 NOT DETECTED NOT DETECTED Final   Coronavirus OC43 NOT DETECTED NOT DETECTED Final   Metapneumovirus NOT DETECTED NOT DETECTED Final   Rhinovirus / Enterovirus NOT DETECTED NOT DETECTED Final   Influenza A NOT DETECTED NOT DETECTED Final   Influenza B NOT DETECTED NOT DETECTED Final   Parainfluenza Virus 1 NOT DETECTED NOT DETECTED Final   Parainfluenza Virus 2 NOT DETECTED NOT DETECTED Final   Parainfluenza Virus 3 NOT DETECTED NOT DETECTED Final   Parainfluenza Virus 4 NOT DETECTED NOT DETECTED Final   Respiratory Syncytial Virus NOT DETECTED NOT DETECTED Final   Bordetella pertussis NOT DETECTED NOT DETECTED Final   Bordetella Parapertussis NOT DETECTED NOT DETECTED Final   Chlamydophila pneumoniae NOT DETECTED NOT DETECTED Final   Mycoplasma pneumoniae NOT DETECTED NOT DETECTED Final    Comment: Performed at [facility], [place] [address], [place], [place] [address]         Radiology Studies: CT HEAD WO CONTRAST (5MM)  Result Date: 09/15/2021 CLINICAL DATA:  Mental status changes of unknown cause, history atrial  fibrillation, hypertension, type II diabetes mellitus EXAM: CT HEAD WITHOUT CONTRAST TECHNIQUE: Contiguous axial images were obtained from the base of the skull through the vertex without intravenous contrast. RADIATION DOSE REDUCTION: This exam was performed according to the departmental dose-optimization program which includes automated exposure control, adjustment of the mA and/or kV according to patient size and/or use of iterative reconstruction technique. COMPARISON:  08/25/2021 FINDINGS: Brain: Generalized atrophy. Normal ventricular morphology. No midline shift or mass effect. Small vessel chronic ischemic changes of deep cerebral white matter. No intracranial hemorrhage, mass lesion, evidence of acute infarction, or extra-axial fluid collection. Small dural calcification RIGHT tentorium unchanged. Vascular: No hyperdense vessels. Atherosclerotic calcification of internal carotid and vertebral arteries at skull base Skull: Intact Sinuses/Orbits: Clear Other: N/A IMPRESSION: Atrophy with small vessel chronic ischemic changes of deep cerebral white matter. No acute intracranial abnormalities. Electronically Signed   By: Lavonia Dana M.D.   On: 09/15/2021 12:51   DG Abd Portable 1V  Result Date: 09/15/2021 CLINICAL DATA:  Feeding tube placement EXAM: PORTABLE ABDOMEN - 1 VIEW COMPARISON:  09/25/2019 FINDINGS: Bilateral patchy interstitial and alveolar airspace opacities concerning for multilobar pneumonia. Feeding tube with the tip projecting over the stomach. No bowel dilatation to suggest obstruction. No evidence of pneumoperitoneum, portal venous gas or pneumatosis. No pathologic calcifications along the expected course of the ureters. No acute osseous abnormality. IMPRESSION: 1. Feeding tube with the tip projecting over the stomach. 2. Bilateral patchy interstitial and alveolar airspace opacities concerning for multilobar pneumonia. Electronically Signed   By: Kathreen Devoid M.D.   On: 09/15/2021 11:22    DG Swallowing Func-Speech Pathology  Result Date: 09/14/2021 Table formatting from the original result was not included. Objective Swallowing Evaluation: Type of Study: MBS-Modified Barium Swallow Study  Patient Details Name: TAKEYLA MILLION MRN: [id number] Date of Birth: Aug 09, 1932 Today's Date: 09/14/2021 Time: SLP Start Time (ACUTE ONLY): 0840 -SLP Stop Time (ACUTE ONLY): 0850 SLP Time Calculation (min) (ACUTE ONLY): 10 min Past Medical History: Past Medical History: Diagnosis Date  Anginal pain ([place])   occ  Anxiety   Broken ankle   Bullous pemphigus   CAD (coronary artery disease)   [place]   80% stenosis ostial Left main, 30% stenosis proximal LAD, 80% stenosis proximal RCA CABG  -  LIMA to LAD, SVG to int, SVG to [place], [place]   Depression   Diabetic peripheral neuropathy ([place])   Encounter for loop recorder check 04/11/2019  GERD (gastroesophageal reflux disease)   H/O: stroke: Left occipital stroke and vision loss 08/24/2018  Headache(784.0)   hx migraines  Heart murmur   mvp  Hyperlipidemia   Hypertension   Hypertensive heart disease   Loop recorder Reveal 08/24/2018 04/11/2019  Scheduled Remote loop recorder check 03/10/2019: The presenting rhythm is sinus bradycardia. There were 0 symptomatic patient activations recorded. There were 275 AF episodes and 0 AT episodes recorded, representing a 4.2 % cumulative AT/AF burden. The longest episode lasted 00:03:40:00 in duration. Some false with PAC. There were 0 tachy episodes detected. There were 0 pause episodes detected. Th  Nasal bleeding   occ due to O2  OA (osteoarthritis)   Obesity (BMI 30-39.9)   Obstructive sleep apnea on CPAP   Osteopenia   Paroxysmal atrial fibrillation ([place]) 04/17/2019  Peripheral neuropathy   Seizure ([place])   2009  Sleep apnea   dx 27yr ago doesnt use cpap but uses O2 at nite 2L  Stones in the urinary tract   Type 2 diabetes mellitus with vascular disease ([place]   UTI (lower urinary tract infection)  Past Surgical  History: Past Surgical History: Procedure Laterality Date  ABDOMINAL HYSTERECTOMY  70's  CARDIAC CATHETERIZATION    CARDIOVERSION N/A 07/08/2021  Procedure: CARDIOVERSION;  Surgeon: GAdrian Prows MD;  [facility]  Service: Cardiovascular;  Laterality: N/A;  CARPAL TUNNEL RELEASE Bilateral   CATARACT EXTRACTION W/PHACO Bilateral   CHOLECYSTECTOMY    CORONARY ANGIOPLASTY WITH STENT PLACEMENT  2023  CORONARY ARTERY BYPASS GRAFT  1996  EYE SURGERY Left   LOOP RECORDER INSERTION N/A 08/24/2018  Procedure: LOOP RECORDER INSERTION;  Surgeon: CConstance Haw MD;  [facility];  Service: Cardiovascular;  Laterality: N/A;  RIGHT/LEFT HEART CATH AND CORONARY/GRAFT ANGIOGRAPHY N/A 07/12/2021  Procedure: RIGHT/LEFT HEART CATH AND CORONARY/GRAFT ANGIOGRAPHY;  Surgeon: PNigel Mormon MD;  [facility];  Service: Cardiovascular;  Laterality: N/A;  SHOULDER ARTHROSCOPY    rt  SHOULDER ARTHROSCOPY WITH ROTATOR CUFF REPAIR AND SUBACROMIAL DECOMPRESSION  12/01/2011  Procedure: SHOULDER ARTHROSCOPY WITH ROTATOR CUFF REPAIR AND SUBACROMIAL DECOMPRESSION;  Surgeon: JNita Sells MD;  [facility]  Service: Orthopedics;  Laterality: Left;  shoulder debridement calcific tendonitis HPI: This is an 88year old female who presented to the hospital for hallucinations and weakness.  Per family delirium had been increasing for about a week. Her PCP had felt she may have a UTI and she was started on cephalexin the day before coming to the hospital.  She was initially admitted for multifocal pneumonia and started on ceftriaxone and azithromycin.  She was later found to have E. coli bacteremia. Pulmonology consulted ordered SLP b/c aspiration part of differential dx. Pt has h/o CHF and CVA.  BSE completed - and MBS advised.  Pt was demonstrating throat clearing with thin po intake on Sun, Sept 3rd and dys3/nectar diet initiated.  Subjective: pt awake in flouro chair, RN present   Recommendations for follow up therapy are one component of a multi-disciplinary discharge planning process, led by the attending physician.  Recommendations may  be updated based on patient status, additional functional criteria and insurance authorization. Assessment / Plan / Recommendation   09/14/2021   9:31 AM Clinical Impressions Clinical Impression Abbreviated test with SLP being able to observe nectar via tsp and cup only showing moderately severe oropharyngeal dysphagia.  Initially pt fully alert, able to verbalize her name and willing to participate in [place].  Only two boluses were swallowed.  Severely limited labial closure noted with pt allowing 90% of boluses to spill anteriorally without awareness.  SLP then administered bolus via cup - essentially dumping a small portion into anterior oral cavity.  A small portion of nectar transited into pharynx with swallow triggering at vallecular space with trace laryngeal penetration due to impaired laryngeal closure.  Pt did not follow directions to cough, clear her throat or re-swallow.  She then began retching ongoing for approximately 3 minutes after 2nd barium swallow.  RN then provided suction to anterior oral cavity.  SLP resumed testing, and pt noted to have sudden AMS during administration attempts.   She did not stay alert despite gentle sternal rubbing and consistent verbal stimulation. Testing was then aborted for pt safety and SLP assisted to get pt back to her ICU room.   SLP left pt in her ICU room with MD, family member and nurses.  Recommend MBS when pt medically stable to participate, will continue efforts.  Shared results of testing with pt family member, MD and RNs. SLP Visit Diagnosis Dysphagia, oropharyngeal phase (R13.12) Impact on safety and function Severe aspiration risk;Risk for inadequate nutrition/hydration     09/14/2021   9:31 AM Treatment Recommendations Treatment Recommendations Therapy as outlined in treatment plan below     09/14/2021    9:46 AM Prognosis Prognosis for Safe Diet Advancement Guarded Barriers to Reach Goals Severity of deficits   09/14/2021   9:31 AM Diet Recommendations SLP Diet Recommendations Nectar Liquids Liquid Administration via Cup; NO STRAW Medication Administration Via alternative means or with applesauce Compensations Slow rate;Small sips/bites Postural Changes Remain semi-upright after after feeds/meals (Comment);Seated upright at 90 degrees; oral suction if pt does not swallow     09/14/2021   9:31 AM Other Recommendations Oral Care Recommendations Oral care BID Follow Up Recommendations Skilled nursing-short term rehab (<3 hours/day) Assistance recommended at discharge Frequent or constant Supervision/Assistance Functional Status Assessment Patient has had a recent decline in their functional status and demonstrates the ability to make significant improvements in function in a reasonable and predictable amount of time.   09/14/2021   9:31 AM Frequency and Duration  Speech Therapy Frequency (ACUTE ONLY) min 2x/week Treatment Duration 2 weeks     09/14/2021   9:24 AM Oral Phase Oral Phase Impaired Oral - Nectar Teaspoon Weak lingual manipulation;Delayed oral transit;Left anterior bolus loss;Right anterior bolus loss;Lingual/palatal residue;Lingual pumping;Premature spillage Oral - Nectar Cup Lingual/palatal residue;Weak lingual manipulation;Left anterior bolus loss;Right anterior bolus loss;Premature spillage;Delayed oral transit Oral - Nectar Straw Weak lingual manipulation    09/14/2021   9:26 AM Pharyngeal Phase Pharyngeal Phase Impaired Pharyngeal- Nectar Teaspoon Delayed swallow initiation-vallecula;Reduced laryngeal elevation;Reduced airway/laryngeal closure;Penetration/Aspiration during swallow Pharyngeal Material enters airway, remains ABOVE vocal cords and not ejected out Pharyngeal- Nectar Cup Delayed swallow initiation-vallecula;Reduced laryngeal elevation;Reduced airway/laryngeal closure Pharyngeal Material enters  airway, remains ABOVE vocal cords and not ejected out Pharyngeal- Nectar Straw NT Pharyngeal Comment Pt began wretching after 2nd bolus of nectar - minimal oral suction provided by RN to anterior oral cavity - within 2 minutes pt developed sudden AMS - closing  her eyes, not staying alert, nor following directions despite deep sternal rubbing. Test was then aborted for pt safety and SLP assisted to transfer pt back to her room.    09/14/2021   9:31 AM Cervical Esophageal Phase  Cervical Esophageal Phase Darryll Capers Macario Golds 09/14/2021, 9:50 AM      Kathleen Lime, MS [facility] SLP Acute Rehab Services Office [phone] Pager [phone]                 Scheduled Meds:  apixaban  2.5 mg Oral BID   Chlorhexidine Gluconate Cloth  6 each Topical Daily   clopidogrel  75 mg Oral Daily   memantine  28 mg Oral QHS   And   donepezil  10 mg Oral QHS   DULoxetine  60 mg Oral Daily   ezetimibe  10 mg Oral Daily   insulin aspart  0-15 Units Subcutaneous TID WC   insulin glargine-yfgn  30 Units Subcutaneous Daily   isosorbide dinitrate  30 mg Oral BID   linagliptin  5 mg Oral Daily   methylPREDNISolone (SOLU-MEDROL) injection  120 mg Intravenous Q24H   [START ON 09/17/2021] methylPREDNISolone (SOLU-MEDROL) injection  40 mg Intravenous Daily   metoprolol succinate  12.5 mg Oral Daily   mirabegron ER  25 mg Oral Daily   multivitamin with minerals  1 tablet Per Tube Daily   mouth rinse  15 mL Mouth Rinse 4 times per day   pantoprazole  40 mg Oral Daily   rosuvastatin  10 mg Oral QAC supper   sodium chloride flush  3 mL Intravenous Q12H   Continuous Infusions:  sodium chloride Stopped (09/14/21 0823)   feeding supplement (OSMOLITE 1.5 CAL)            Aline August, MD [facility] 09/15/2021, 1:24 PM

## 2021-09-15 NOTE — Progress Notes (Signed)
Initial Nutrition Assessment  INTERVENTION:    Monitor magnesium, potassium, and phosphorus BID for at least 3 days, MD to replete as needed, as pt is at risk for refeeding syndrome.  -Initiate Osmolite 1.5 @ 20 ml/hr via small bore NGT, advance by 10 ml every 12 hours to goal rate of 50 ml/hr. -Provides at goal: 1800 kcals, 75g protein and 914 ml H2O.  NUTRITION DIAGNOSIS:   Inadequate oral intake related to lethargy/confusion as evidenced by per patient/family report.  GOAL:   Patient will meet greater than or equal to 90% of their needs  MONITOR:   PO intake, Labs, Weight trends, TF tolerance, I & O's  REASON FOR ASSESSMENT:   Consult Assessment of nutrition requirement/status, Enteral/tube feeding initiation and management  ASSESSMENT:   88 year old female with PMH as below, which is significant for  Most recently admitted back in June for CHF exacerbation and Atrial fibrillation. Improved with diuresis and cardioversion respectively. New tx at discharge included amiodarone and oxygen 2 liters. She presented to [facility] ED 8/25 with complaints of weakness and delirium progressive over a five day period.  8/25: admitted  Patient in room, not able to provide any history. Pt's family at bedside. Per family, pt was eating well during this admission until she started to have difficulty swallowing and then yesterday she became increasingly lethargic. Today is nonverbal. Per SLP note, recommended MBS and pt was not safe for PO d/t inability to stay awake and alert.   Small bore NGT was placed this morning. MD verified tube tip and tube feeding to be initiated today at 20 ml/hr. Pt at refeeding risk given pt not eating consistently during this admission.  Pt's family states she was drinking Ensure at home PTA.  Admission weight: 157 lbs. Current weight: 125 lbs. -lowest weight this admission. Weight on 9/5 was 158 lbs. Not sure if today's weight is an error. Will monitor weight  trends.   Medications reviewed.  Labs reviewed: CBGs: 131-196 Elevated Na   NUTRITION - FOCUSED PHYSICAL EXAM:  Flowsheet Row Most Recent Value  Orbital Region Mild depletion  Upper Arm Region No depletion  [edema]  Thoracic and Lumbar Region No depletion  Buccal Region Mild depletion  Temple Region Mild depletion  Clavicle Bone Region No depletion  Clavicle and Acromion Bone Region No depletion  Scapular Bone Region No depletion  Dorsal Hand No depletion  Patellar Region No depletion  Anterior Thigh Region No depletion  Posterior Calf Region No depletion  Edema (RD Assessment) Moderate  [BUE, BLE]  Hair Reviewed  [thin]  Eyes Reviewed  Mouth Reviewed  [missing teeth]  Skin Reviewed       Diet Order:   Diet Order             Diet NPO time specified  Diet effective now                   EDUCATION NEEDS:   Education needs have been addressed  Skin:  Skin Assessment: Skin Integrity Issues: Skin Integrity Issues:: Other (Comment) Other: IAD coccyx  Last BM:  9/3 -type 4  Height:   Ht Readings from Last 1 Encounters:  08/31/2021 5' (1.524 m)    Weight:   Wt Readings from Last 1 Encounters:  09/15/21 56.9 kg    BMI:  Body mass index is 24.5 kg/m.  Estimated Nutritional Needs:   Kcal:  1500-1700  Protein:  70-85g  Fluid:  1.7L/day  Clayton Bibles, MS,  RD, LDN Inpatient Clinical Dietitian Contact information available via Amion

## 2021-09-15 NOTE — Progress Notes (Signed)
NAME:  Linda Oliver, MRN:  [id number], DOB:  Jun 30, 1932, LOS: 12 ADMISSION DATE:  09/06/2021, CONSULTATION DATE:  8/31 REFERRING MD:  Dr. Wynelle Cleveland, CHIEF COMPLAINT:  Hypoxia   History of Present Illness:  88 year old female with PMH as below, which is significant for  Most recently admitted back in June for CHF exacerbation and Atrial fibrillation. Improved with diuresis and cardioversion respectively. New tx at discharge included amiodarone and oxygen 2 liters. She presented to [facility] ED 8/25 with complaints of weakness and delirium progressive over a five day period. Poor PO intake and urinary output noted as well. Upon arrival to the ED she was noted to be febrile to 100.5 and have what appeared to be multifocal pneumonia on imaging. She was admitted to the hospitalists for CAP treatment including antibiotics and steroids. Blood cultures positive for E. Coli. She continued to improve with antibiotic therapy until 8/31, when she began to develop shortness of breath, which devolved into hypoxia and respiratory distress. She was transferred to ICU and supplemental oxygen was increased. CT chest consistent with multifocal infiltrates. PCCM consulted.   Pertinent  Medical History  DM,  CAD s/p CABG,  CVA with medial left occipital infarct in embolic pattern by MRI in 2020 and loop recorder implanted showed paroxysmal atrial fibrillation.   OSA on CPAP,  PAF on eliquis,  Seizure, and UTI. stage 3a CKD    Significant Hospital Events: Including procedures, antibiotic start and stop dates in addition to other pertinent events   8/25 admit for CAP 8/26 CT chest without contrast: Bilateral multifocal infiltrates 8/27 BC postive for E. Coli.  8/31 tx to ICU in respiratory distress >> bipap 9/1Given Ativan at 8 PM -slept all day, on BiPAP 9/2 switched to heated high flow nasal cannula 70% / 15 L 9/3 on HHFNC 50% / 15 L , advance to dysphagia 2 diet 9/5 modified barium swallow showing moderately  severe dysphagia.   Interim History / Subjective:   Weaned to 2L this morning BiPAP at night Very weak and frail this morning.  Issues swallowing yesterday.   Had MBS this morning limited by lethargy. With limited testing it did appear she has severe oropharyngeal dysphagia.   Objective   Blood pressure (!) 184/29, pulse 78, temperature (!) 97.4 F (36.3 C), temperature source Axillary, resp. rate 18, height 5' (1.524 m), weight 56.9 kg, SpO2 93 %.    FiO2 (%):  [32 %] 32 %   Intake/Output Summary (Last 24 hours) at 09/15/2021 0946 Last data filed at 09/15/2021 0500 Gross per 24 hour  Intake --  Output 850 ml  Net -850 ml    Filed Weights   09/13/21 0500 09/14/21 0500 09/15/21 0504  Weight: 68.5 kg 71.8 kg 56.9 kg    Examination: General: Frail elderly female in NAD HENT: [place]/AT, PERRL, no JVD Lungs: Bilateral scattered crackles, no accessory muscle use, no rhonchi Cardiovascular: S1S2 regular, no murmur Abdomen: Soft, non-tender, non-distended.  Extremities: No acute deformity. No edema.  Neuro: Nonfocal, interactive  Labs show normal electrolytes,, rising BUN/creatinine, BNP 335 Chest x-ray 9/4 independently reviewed shows persistent bilateral airspace opacities, interstitial and alveolar  CT chest without contrast 8/31 reviewed progressive multifocal infiltrates  Resolved Hospital Problem list     Assessment & Plan:   Acute on chronic hypoxemic respiratory failure: Discharge 06/2021 on oxygen for pneumonia.  Query whether this is related to actual current underlying condition.  Gradually improving with steroid therapy.  High suspicion currently is  for amiodarone toxicity given diffuse pattern of disease on CT scan.  Aspiration remains a concern. -- Completed 10 days of cefepime, see no role for further antibiotics -- Continue Solu-Medrol decreased to q12 9/5, further wean in to 40 mg daily 9/8 (consider prednisone if taking PO or has enteral access) given what appears  to be new weakness versus level of alertness and concern for possible steroid myopathy -- Hold amiodarone and Lasix   Acute kidney injury: With concomitant hyponatremia high suspicion for hypovolemia -- Consider IV fluids per primary, given swallowing issues, recommend NG tube -- Hold Lasix -- start tube feeds, recommend free water once enteral access obtained  Toxic/Metabolic Encephalopathy: Uremia, renal failure, delirium --hydrate --Avoid centrally acting meds   OSA: Continue NIPPV at night   Best Practice (right click and "Reselect all SmartList Selections" daily)   Per Primary   Last date of multidisciplinary goals of care discussion '[ ]'$  9/6 updated son-in-law at bedside and daughter, discussed poor prognosis should she need mechanical ventilation given advanced age and comorbidities and weakness.  DNR status recommended and agreed to.  Labs   CBC: Recent Labs  Lab 09/09/21 0806 09/10/21 0751 09/11/21 0259 09/14/21 0304  WBC 18.8* 14.2* 18.1* 21.4*  HGB 7.9* 7.8* 7.4* 7.4*  HCT 26.6* 26.1* 25.6* 25.1*  MCV 87.2 85.6 88.9 85.7  PLT 321 319 359 329     Basic Metabolic Panel: Recent Labs  Lab 09/11/21 0259 09/12/21 0331 09/13/21 0250 09/13/21 1334 09/14/21 0304  NA 146* 143 143 145 146*  K 3.8 3.8 3.7 3.5 3.5  CL 109 108 109 107 112*  CO2 '26 28 26 26 26  '$ GLUCOSE 234* 217* 301* 324* 170*  BUN 62* 73* 90* 89* 99*  CREATININE 1.79* 1.90* 2.12* 2.43* 2.56*  CALCIUM 8.5* 8.2* 8.3* 8.8* 8.4*    GFR: Estimated Creatinine Clearance: 12.2 mL/min (A) (by C-G formula based on SCr of 2.56 mg/dL (H)). Recent Labs  Lab 09/09/21 0806 09/10/21 0751 09/11/21 0259 09/14/21 0304  WBC 18.8* 14.2* 18.1* 21.4*     Liver Function Tests: No results for input(s): "AST", "ALT", "ALKPHOS", "BILITOT", "PROT", "ALBUMIN" in the last 168 hours.  No results for input(s): "LIPASE", "AMYLASE" in the last 168 hours. No results for input(s): "AMMONIA" in the last 168  hours.  ABG    Component Value Date/Time   PHART 7.43 09/09/2021 1549   PCO2ART 47 09/09/2021 1549   PO2ART 69 (L) 09/09/2021 1549   HCO3 31.2 (H) 09/09/2021 1549   TCO2 37 (H) 07/12/2021 1259   O2SAT 96.2 09/09/2021 1549     Coagulation Profile: No results for input(s): "INR", "PROTIME" in the last 168 hours.   Cardiac Enzymes: No results for input(s): "CKTOTAL", "CKMB", "CKMBINDEX", "TROPONINI" in the last 168 hours.  HbA1C: Hgb A1c MFr Bld  Date/Time Value Ref Range Status  07/07/2021 05:25 AM 7.8 (H) 4.8 - 5.6 % Final    Comment:    (NOTE) Pre diabetes:          5.7%-6.4%  Diabetes:              >6.4%  Glycemic control for   <7.0% adults with diabetes   08/23/2018 04:34 AM 8.7 (H) 4.8 - 5.6 % Final    Comment:    (NOTE) Pre diabetes:          5.7%-6.4% Diabetes:              >6.4% Glycemic control for   <7.0% adults  with diabetes     CBG: Recent Labs  Lab 09/14/21 0937 09/14/21 1208 09/14/21 1621 09/14/21 2104 09/15/21 0825  GLUCAP 167* 165* 147* 131* 196*       Lanier Clam, MD [facility]  See Amion for contact info PCCM on call pager [phone] until 7pm. Please call Elink 7p-7a. [phone]  09/15/2021 9:46 AM

## 2021-09-16 DIAGNOSIS — I5042 Chronic combined systolic (congestive) and diastolic (congestive) heart failure: Secondary | ICD-10-CM

## 2021-09-16 DIAGNOSIS — N179 Acute kidney failure, unspecified: Secondary | ICD-10-CM | POA: Diagnosis not present

## 2021-09-16 DIAGNOSIS — Z515 Encounter for palliative care: Secondary | ICD-10-CM

## 2021-09-16 DIAGNOSIS — J9601 Acute respiratory failure with hypoxia: Secondary | ICD-10-CM | POA: Diagnosis not present

## 2021-09-16 DIAGNOSIS — J189 Pneumonia, unspecified organism: Secondary | ICD-10-CM | POA: Diagnosis not present

## 2021-09-16 DIAGNOSIS — Z66 Do not resuscitate: Secondary | ICD-10-CM

## 2021-09-16 DIAGNOSIS — I5023 Acute on chronic systolic (congestive) heart failure: Secondary | ICD-10-CM | POA: Diagnosis not present

## 2021-09-16 LAB — GLUCOSE, CAPILLARY
Glucose-Capillary: 310 mg/dL — ABNORMAL HIGH (ref 70–99)
Glucose-Capillary: 271 mg/dL — ABNORMAL HIGH (ref 70–99)
Glucose-Capillary: 312 mg/dL — ABNORMAL HIGH (ref 70–99)

## 2021-09-16 MED ORDER — HALOPERIDOL 1 MG PO TABS: 0.5000 mg | ORAL_TABLET | ORAL | Status: DC | PRN

## 2021-09-16 MED ORDER — MORPHINE SULFATE (PF) 2 MG/ML IV SOLN: 1.0000 mg | INTRAVENOUS | Status: DC | PRN

## 2021-09-16 MED ORDER — POLYVINYL ALCOHOL 1.4 % OP SOLN
1.0000 [drp] | Freq: Four times a day (QID) | OPHTHALMIC | Status: DC | PRN
  Administered 2021-09-16: 1 [drp] via OPHTHALMIC
  Filled 2021-09-16: qty 15

## 2021-09-16 MED ORDER — LIP MEDEX EX OINT: TOPICAL_OINTMENT | CUTANEOUS | Status: DC | PRN

## 2021-09-16 MED ORDER — MORPHINE 100MG IN NS 100ML (1MG/ML) PREMIX INFUSION
0.0000 mg/h | INTRAVENOUS | Status: DC
  Administered 2021-09-16: 4 mg/h via INTRAVENOUS
  Filled 2021-09-16: qty 100

## 2021-09-16 MED ORDER — GLYCOPYRROLATE 0.2 MG/ML IJ SOLN
0.2000 mg | INTRAMUSCULAR | Status: DC | PRN
  Filled 2021-09-16: qty 1

## 2021-09-16 MED ORDER — GLYCOPYRROLATE 1 MG PO TABS
1.0000 mg | ORAL_TABLET | ORAL | Status: DC | PRN
Start: 2021-09-16 — End: 2021-09-16

## 2021-09-16 MED ORDER — ONDANSETRON HCL 4 MG/2ML IJ SOLN: 4.0000 mg | Freq: Four times a day (QID) | INTRAMUSCULAR | Status: DC | PRN

## 2021-09-16 MED ORDER — HYDROMORPHONE HCL 1 MG/ML IJ SOLN: 1.0000 mg | INTRAMUSCULAR | Status: DC | PRN

## 2021-09-16 MED ORDER — HYDROMORPHONE HCL 1 MG/ML IJ SOLN
0.5000 mg | INTRAMUSCULAR | Status: DC | PRN
  Administered 2021-09-16: 1 mg via INTRAVENOUS
  Filled 2021-09-16: qty 1

## 2021-09-16 MED ORDER — BIOTENE DRY MOUTH MT LIQD: 15.0000 mL | OROMUCOSAL | Status: DC | PRN

## 2021-09-16 MED ORDER — GLYCOPYRROLATE 0.2 MG/ML IJ SOLN
0.4000 mg | INTRAMUSCULAR | Status: DC | PRN
  Administered 2021-09-16: 0.4 mg via INTRAVENOUS

## 2021-09-16 MED ORDER — INSULIN ASPART 100 UNIT/ML IJ SOLN
10.0000 [IU] | Freq: Once | INTRAMUSCULAR | Status: AC
  Administered 2021-09-16: 10 [IU] via SUBCUTANEOUS

## 2021-09-16 MED ORDER — DIAZEPAM 5 MG/ML IJ SOLN: 5.0000 mg | INTRAMUSCULAR | Status: DC | PRN

## 2021-09-16 MED ORDER — HYDROMORPHONE HCL 1 MG/ML IJ SOLN: 0.5000 mg | INTRAMUSCULAR | Status: DC | PRN

## 2021-09-16 MED ORDER — FOLIC ACID 1 MG PO TABS
1.0000 mg | ORAL_TABLET | Freq: Every day | ORAL | Status: DC
Start: 2021-09-17 — End: 2021-09-16

## 2021-09-16 MED ORDER — MORPHINE SULFATE (PF) 2 MG/ML IV SOLN
1.0000 mg | INTRAVENOUS | Status: DC | PRN
  Administered 2021-09-16: 1 mg via INTRAVENOUS
  Filled 2021-09-16: qty 1

## 2021-09-16 MED ORDER — HALOPERIDOL LACTATE 5 MG/ML IJ SOLN
1.0000 mg | INTRAMUSCULAR | Status: DC | PRN
  Administered 2021-09-16: 1 mg via INTRAVENOUS
  Filled 2021-09-16: qty 1

## 2021-09-16 MED ORDER — HALOPERIDOL LACTATE 2 MG/ML PO CONC: 0.5000 mg | ORAL | Status: DC | PRN

## 2021-09-16 MED ORDER — ONDANSETRON 4 MG PO TBDP: 4.0000 mg | ORAL_TABLET | Freq: Four times a day (QID) | ORAL | Status: DC | PRN

## 2021-09-20 ENCOUNTER — Telehealth: Payer: Self-pay | Admitting: Cardiology

## 2021-09-20 NOTE — Telephone Encounter (Signed)
Patient's daughter called to inform patient is deceased. Please advise.

## 2021-09-23 ENCOUNTER — Ambulatory Visit: Payer: Medicare Other | Admitting: Cardiology

## 2021-10-10 NOTE — Progress Notes (Signed)
Chaplain checked in on family to assess for additional needs.  They reported that they are okay for now.  There were several family members present with Linda Oliver and they were supporting each other well.  Please page as needs arise overnight.  [address], [place] Pager, [phone]

## 2021-10-10 NOTE — Progress Notes (Signed)
I have discontinued plavix as she has completed 6 months post PCI in Feb 2023. Could consider stopping statins and zetia unless she makes recovery.   JG

## 2021-10-10 NOTE — Progress Notes (Signed)
OT Cancellation Note  Patient Details Name: Linda Oliver MRN: [id number] DOB: 1932/11/08   Cancelled Treatment:    Reason Eval/Treat Not Completed: Other (comment). Hold per RN today. Family potentially moving towards comfort care.  Monya Kozakiewicz L Azzure Garabedian September 27, 2021, 9:29 AM

## 2021-10-10 NOTE — Progress Notes (Signed)
       CROSS COVER NOTE  NAME: Linda Oliver MRN: [id number] DOB : 05-Nov-1932   Notified by RN that patient has expired at 2127  Patient was DNR and comfort care  2 RN verified.  Family was at bedside and available to RN.    This document was prepared using Dragon voice recognition software and may include unintentional dictation errors.  Neomia Glass DNP, MHA, FNP-BC Nurse Practitioner [facility] Pager [phone]

## 2021-10-10 NOTE — Progress Notes (Signed)
Physical Therapy Discharge Patient Details Name: Linda Oliver MRN: [id number] DOB: 06-22-1932 Today's Date: Sep 22, 2021 Time:  -     Patient discharged from PT services secondary to medical decline -patient  is on comfort care. GP   [place] Office [phone] Weekend [phone]   [name] 22-Sep-2021, 8:24 AM

## 2021-10-10 NOTE — Progress Notes (Signed)
SLP Cancellation Note  Patient Details Name: Linda Oliver MRN: [id number] DOB: 01/12/1932   Cancelled treatment:       Reason Eval/Treat Not Completed: Other (comment) (per notes, pt moving toward full comfort; will sign off; please reorder if desire)  Kathleen Lime, MS [place] Office [phone] Pager [phone]   Macario Golds 2021-10-10, 8:01 AM

## 2021-10-10 NOTE — Consult Note (Signed)
Consultation Note Date: 2021-10-01   Patient Name: Linda Oliver  DOB: 1932-04-27  MRN: [id number]  Age / Sex: 88 y.o., female  PCP: Shon Baton, MD Referring Physician: Aline August, MD  Reason for Consultation: Establishing goals of care  HPI/Patient Profile: 88 y.o. female  with past medical history of CHF, A-fib, type 2 diabetes, CABG, CVA (2020), OSA (CPAP at bedtime), seizures, and CKD (stage IIIa) admitted on 08/17/2021 with weakness and delirium for 5 days.  Patient is being treated for multifocal pneumonia.  PMT was consulted to assist with goals of care discussions.  Clinical Assessment and Goals of Care: I have reviewed medical records including EPIC notes, labs and imaging, assessed the patient and then met with patient, her son-in-law Linda Oliver, and grandchildren at bedside to discuss diagnosis prognosis, [place], EOL wishes, disposition and options.  I introduced Palliative Medicine as specialized medical care for people living with serious illness. It focuses on providing relief from the symptoms and stress of a serious illness. The goal is to improve quality of life for both the patient and the family.  We discussed a brief life review of the patient.  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  She has a boyfriend (just over a year), one daughter, and two grand daughters.  We discussed patient's current illness and what it means in the larger context of patient's on-going co-morbidities.  Natural disease trajectory and expectations at EOL were discussed. I attempted to elicit values and goals of care important to the patient. Family shares they do not want the patient to suffer or be in pain. They share patient has had come functional and cognitive decline over the past few months.  Family is prepared to move forward with comfort measures once patient's daughter and boyfriend arrive and visit.     I  rounded on the patient at approximately 4pm. Patient's daughter and boyfriend have arrived. They shared they are waiting to remove tubes/lines/monitoring and focus on comfort until granddaughter arrives. Therapeutic silence, active listening, and emotional support provided for family. Expectations and s/s are EOL reviewed.   Granddaughter arrived during my visit and family confirmed they would like to proceed with full comfort measures.   All medications/procedures/treatments not focused on comfort discontinued.  Order placed: Dilaudid PRN - elevated creatinine and morphine is contraindicated  Unrestricted visitor access Comfort measures only Haldol PRN for agitation/delirium Remove oxygen/feeding tube/cardiac monitoring Vital signs to be taken at family's request RN may pronounce death Titrate medications to optimize comfort - minimize air hunger, WOB, pain  Patient appears comfortable and not agitated. Should Haldol and other medications not be adequate for terminal agitation, I would recommend Vallium d;/t pt's kidney function.    Primary Decision Maker NEXT OF KIN  Code Status/Advance Care Planning: DNR  Prognosis:   Hours - Days  Discharge Planning: Anticipated Hospital Death  Primary Diagnoses: Present on Admission:  Multifocal pneumonia  Obesity (BMI 30-39.9)  GERD (gastroesophageal reflux disease)  Diabetic peripheral neuropathy (HCC)  Essential hypertension  Paroxysmal  atrial fibrillation (HCC)  Type 2 diabetes mellitus with hyperlipidemia (HCC)  Coronary artery disease without angina pectoris  Carotid stenosis, asymptomatic, bilateral  Chronic anemia  Acute on chronic systolic CHF (congestive heart failure) (HCC)  Acute kidney injury superimposed on chronic kidney disease [facility])   Physical Exam Vitals reviewed.  Constitutional:      General: She is not in acute distress.    Appearance: Normal appearance. She is not toxic-appearing.  HENT:     Head:  Normocephalic.     Mouth/Throat:     Mouth: Mucous membranes are moist.     Comments: Missing teeth/dentures Cardiovascular:     Rate and Rhythm: Tachycardia present.     Pulses: Normal pulses.  Pulmonary:     Effort: Pulmonary effort is normal.     Comments: Roberts in place Musculoskeletal:     Comments: Generalized weakness - EOL  Skin:    General: Skin is warm and dry.     Comments: Bilateral UE/hand edema - nonpitting     Palliative Assessment/Data: 10%     Thank you for this consult. Palliative medicine will continue to follow and assist holistically.   Time Total: 120 minutes Greater than 50%  of this time was spent counseling and coordinating care related to the above assessment and plan.  Signed by: Jordan Hawks, DNP, FNP-BC Palliative Medicine    Please contact Palliative Medicine Team phone at [phone] for questions and concerns.  For individual provider: See Shea Evans

## 2021-10-10 NOTE — Progress Notes (Signed)
Brief PCCM Note  Multiple discussions over the last couple days and several yesterday regarding clinical worsening.  Expressed my concern for active dying.  Recommended comfort care.  The expressed understanding.  Hopeful for more family to arrive.  Strongly recommend comfort care, can consider palliative care to help with ongoing discussions.  Frankly I think she is actively dying and likely will pass soon regardless of outcome of those discussions.  Trial of low-dose morphine overnight.  PCCM will sign off.

## 2021-10-10 NOTE — Discharge Summary (Signed)
Death Summary  Linda Oliver:[id number] DOB: 16-Dec-1932 DOA: 10-02-21  PCP: Shon Baton, MD  Admit date: 10-02-21 Date of Death: Oct 15, 2021 Time of Death: 04/21/25   History of present illness:  88 year old female with CKD stage IIIb, atrial fibrillation, coronary artery disease status post stenting and CABG, diabetes mellitus type 2, OSA, obesity, recent hospitalization from 07/06/2021-07/13/2021 for acute CHF/lower pneumonia and non-STEMI for which underwent cardiac catheterization which showed severe native vessel disease presented to the hospital for hallucinations and weakness.  On presentation, she was found to have multifocal pneumonia and started on Rocephin and Zithromax.  She was subsequently found to have E. coli bacteremia.  On 09/09/2021, she was found to be in respiratory distress: Transferred to stepdown; given IV Lasix.  CT showed increasing pulmonary infiltrates.  PCCM consulted.  Patient required BiPAP and high flow nasal cannula oxygen.  Antibiotics changed to cefepime and Solu-Medrol dose increased by PCCM.  Due to overall decline, palliative care was consulted.  Family agreed to comfort measures on 2021-10-15.  Patient passed away on 10/15/2021 at April 21, 2125.  Please refer to the daily progress notes for full details of hospitalization.  Final Diagnoses:  Comfort measures status only Acute on chronic hypoxic respiratory failure Multifocal infiltrates:?  Bacterial pneumonia/amiodarone toxicity/recurrent aspirations Acute on chronic systolic CHF History of OSA Acute metabolic encephalopathy History of dementia Physical deconditioning E. coli bacteremia AKI on CKD stage IIIb CAD Normocytic anemia/anemia of chronic disease Leukocytosis History of bilateral carotid artery stenosis Hard of hearing   The results of significant diagnostics from this hospitalization (including imaging, microbiology, ancillary and laboratory) are listed below for reference.    Significant Diagnostic  Studies: CT HEAD WO CONTRAST (5MM)  Result Date: 09/15/2021 CLINICAL DATA:  Mental status changes of unknown cause, history atrial fibrillation, hypertension, type II diabetes mellitus EXAM: CT HEAD WITHOUT CONTRAST TECHNIQUE: Contiguous axial images were obtained from the base of the skull through the vertex without intravenous contrast. RADIATION DOSE REDUCTION: This exam was performed according to the departmental dose-optimization program which includes automated exposure control, adjustment of the mA and/or kV according to patient size and/or use of iterative reconstruction technique. COMPARISON:  02-Oct-2021 FINDINGS: Brain: Generalized atrophy. Normal ventricular morphology. No midline shift or mass effect. Small vessel chronic ischemic changes of deep cerebral white matter. No intracranial hemorrhage, mass lesion, evidence of acute infarction, or extra-axial fluid collection. Small dural calcification RIGHT tentorium unchanged. Vascular: No hyperdense vessels. Atherosclerotic calcification of internal carotid and vertebral arteries at skull base Skull: Intact Sinuses/Orbits: Clear Other: N/A IMPRESSION: Atrophy with small vessel chronic ischemic changes of deep cerebral white matter. No acute intracranial abnormalities. Electronically Signed   By: Lavonia Dana M.D.   On: 09/15/2021 12:51   DG Abd Portable 1V  Result Date: 09/15/2021 CLINICAL DATA:  Feeding tube placement EXAM: PORTABLE ABDOMEN - 1 VIEW COMPARISON:  09/25/2019 FINDINGS: Bilateral patchy interstitial and alveolar airspace opacities concerning for multilobar pneumonia. Feeding tube with the tip projecting over the stomach. No bowel dilatation to suggest obstruction. No evidence of pneumoperitoneum, portal venous gas or pneumatosis. No pathologic calcifications along the expected course of the ureters. No acute osseous abnormality. IMPRESSION: 1. Feeding tube with the tip projecting over the stomach. 2. Bilateral patchy interstitial and  alveolar airspace opacities concerning for multilobar pneumonia. Electronically Signed   By: Kathreen Devoid M.D.   On: 09/15/2021 11:22   DG Swallowing Func-Speech Pathology  Result Date: 09/14/2021 Table formatting from the original result was not included. Objective Swallowing  Evaluation: Type of Study: MBS-Modified Barium Swallow Study  Patient Details Name: Linda Oliver MRN: [id number] Date of Birth: [birth year] Today's Date: 09/14/2021 Time: SLP Start Time (ACUTE ONLY): 0840 -SLP Stop Time (ACUTE ONLY): 5638 SLP Time Calculation (min) (ACUTE ONLY): 10 min Past Medical History: Past Medical History: Diagnosis Date  Anginal pain ([place])   occ  Anxiety   Broken ankle   Bullous pemphigus   CAD (coronary artery disease)   [place]   80% stenosis ostial Left main, 30% stenosis proximal LAD, 80% stenosis proximal RCA CABG  -  LIMA to LAD, SVG to int, SVG to [place], [place]   Depression   Diabetic peripheral neuropathy ([place])   Encounter for loop recorder check 04/11/2019  GERD (gastroesophageal reflux disease)   H/O: stroke: Left occipital stroke and vision loss 08/24/2018  Headache(784.0)   hx migraines  Heart murmur   mvp  Hyperlipidemia   Hypertension   Hypertensive heart disease   Loop recorder Reveal 08/24/2018 04/11/2019  Scheduled Remote loop recorder check 03/10/2019: The presenting rhythm is sinus bradycardia. There were 0 symptomatic patient activations recorded. There were 275 AF episodes and 0 AT episodes recorded, representing a 4.2 % cumulative AT/AF burden. The longest episode lasted 00:03:40:00 in duration. Some false with PAC. There were 0 tachy episodes detected. There were 0 pause episodes detected. Th  Nasal bleeding   occ due to O2  OA (osteoarthritis)   Obesity (BMI 30-39.9)   Obstructive sleep apnea on CPAP   Osteopenia   Paroxysmal atrial fibrillation ([place]) 04/17/2019  Peripheral neuropathy   Seizure ([place])   2009  Sleep apnea   dx 2yr ago doesnt use cpap but uses O2 at nite 2L  Stones  in the urinary tract   Type 2 diabetes mellitus with vascular disease ([facility] [place]   UTI (lower urinary tract infection)  Past Surgical History: Past Surgical History: Procedure Laterality Date  ABDOMINAL HYSTERECTOMY  70's  CARDIAC CATHETERIZATION    CARDIOVERSION N/A 07/08/2021  Procedure: CARDIOVERSION;  Surgeon: GAdrian Prows MD;  [facility]  Service: Cardiovascular;  Laterality: N/A;  CARPAL TUNNEL RELEASE Bilateral   CATARACT EXTRACTION W/PHACO Bilateral   CHOLECYSTECTOMY    CORONARY ANGIOPLASTY WITH STENT PLACEMENT  2023  CORONARY ARTERY BYPASS GRAFT  1996  EYE SURGERY Left   LOOP RECORDER INSERTION N/A 08/24/2018  Procedure: LOOP RECORDER INSERTION;  Surgeon: CConstance Haw MD;  [facility];  Service: Cardiovascular;  Laterality: N/A;  RIGHT/LEFT HEART CATH AND CORONARY/GRAFT ANGIOGRAPHY N/A 07/12/2021  Procedure: RIGHT/LEFT HEART CATH AND CORONARY/GRAFT ANGIOGRAPHY;  Surgeon: PNigel Mormon MD;  [facility];  Service: Cardiovascular;  Laterality: N/A;  SHOULDER ARTHROSCOPY    rt  SHOULDER ARTHROSCOPY WITH ROTATOR CUFF REPAIR AND SUBACROMIAL DECOMPRESSION  12/01/2011  Procedure: SHOULDER ARTHROSCOPY WITH ROTATOR CUFF REPAIR AND SUBACROMIAL DECOMPRESSION;  Surgeon: JNita Sells MD;  [facility]  Service: Orthopedics;  Laterality: Left;  shoulder debridement calcific tendonitis HPI: This is an 88year old female who presented to the hospital for hallucinations and weakness.  Per family delirium had been increasing for about a week. Her PCP had felt she may have a UTI and she was started on cephalexin the day before coming to the hospital.  She was initially admitted for multifocal pneumonia and started on ceftriaxone and azithromycin.  She was later found to have E. coli bacteremia. Pulmonology consulted ordered SLP b/c aspiration part of differential dx. Pt has h/o  CHF and CVA.  BSE completed - and MBS advised.  Pt was demonstrating throat  clearing with thin po intake on Sun, Sept 3rd and dys3/nectar diet initiated.  Subjective: pt awake in flouro chair, RN present  Recommendations for follow up therapy are one component of a multi-disciplinary discharge planning process, led by the attending physician.  Recommendations may be updated based on patient status, additional functional criteria and insurance authorization. Assessment / Plan / Recommendation   09/14/2021   9:31 AM Clinical Impressions Clinical Impression Abbreviated test with SLP being able to observe nectar via tsp and cup only showing moderately severe oropharyngeal dysphagia.  Initially pt fully alert, able to verbalize her name and willing to participate in [place].  Only two boluses were swallowed.  Severely limited labial closure noted with pt allowing 90% of boluses to spill anteriorally without awareness.  SLP then administered bolus via cup - essentially dumping a small portion into anterior oral cavity.  A small portion of nectar transited into pharynx with swallow triggering at vallecular space with trace laryngeal penetration due to impaired laryngeal closure.  Pt did not follow directions to cough, clear her throat or re-swallow.  She then began retching ongoing for approximately 3 minutes after 2nd barium swallow.  RN then provided suction to anterior oral cavity.  SLP resumed testing, and pt noted to have sudden AMS during administration attempts.   She did not stay alert despite gentle sternal rubbing and consistent verbal stimulation. Testing was then aborted for pt safety and SLP assisted to get pt back to her ICU room.   SLP left pt in her ICU room with MD, family member and nurses.  Recommend MBS when pt medically stable to participate, will continue efforts.  Shared results of testing with pt family member, MD and RNs. SLP Visit Diagnosis Dysphagia, oropharyngeal phase (R13.12) Impact on safety and function Severe aspiration risk;Risk for inadequate nutrition/hydration      09/14/2021   9:31 AM Treatment Recommendations Treatment Recommendations Therapy as outlined in treatment plan below     09/14/2021   9:46 AM Prognosis Prognosis for Safe Diet Advancement Guarded Barriers to Reach Goals Severity of deficits   09/14/2021   9:31 AM Diet Recommendations SLP Diet Recommendations Nectar Liquids Liquid Administration via Cup; NO STRAW Medication Administration Via alternative means or with applesauce Compensations Slow rate;Small sips/bites Postural Changes Remain semi-upright after after feeds/meals (Comment);Seated upright at 90 degrees; oral suction if pt does not swallow     09/14/2021   9:31 AM Other Recommendations Oral Care Recommendations Oral care BID Follow Up Recommendations Skilled nursing-short term rehab (<3 hours/day) Assistance recommended at discharge Frequent or constant Supervision/Assistance Functional Status Assessment Patient has had a recent decline in their functional status and demonstrates the ability to make significant improvements in function in a reasonable and predictable amount of time.   09/14/2021   9:31 AM Frequency and Duration  Speech Therapy Frequency (ACUTE ONLY) min 2x/week Treatment Duration 2 weeks     09/14/2021   9:24 AM Oral Phase Oral Phase Impaired Oral - Nectar Teaspoon Weak lingual manipulation;Delayed oral transit;Left anterior bolus loss;Right anterior bolus loss;Lingual/palatal residue;Lingual pumping;Premature spillage Oral - Nectar Cup Lingual/palatal residue;Weak lingual manipulation;Left anterior bolus loss;Right anterior bolus loss;Premature spillage;Delayed oral transit Oral - Nectar Straw Weak lingual manipulation    09/14/2021   9:26 AM Pharyngeal Phase Pharyngeal Phase Impaired Pharyngeal- Nectar Teaspoon Delayed swallow initiation-vallecula;Reduced laryngeal elevation;Reduced airway/laryngeal closure;Penetration/Aspiration during swallow Pharyngeal Material enters airway, remains ABOVE vocal cords and  not ejected out Pharyngeal- Nectar Cup  Delayed swallow initiation-vallecula;Reduced laryngeal elevation;Reduced airway/laryngeal closure Pharyngeal Material enters airway, remains ABOVE vocal cords and not ejected out Pharyngeal- Nectar Straw NT Pharyngeal Comment Pt began wretching after 2nd bolus of nectar - minimal oral suction provided by RN to anterior oral cavity - within 2 minutes pt developed sudden AMS - closing her eyes, not staying alert, nor following directions despite deep sternal rubbing. Test was then aborted for pt safety and SLP assisted to transfer pt back to her room.    09/14/2021   9:31 AM Cervical Esophageal Phase  Cervical Esophageal Phase Darryll Capers Macario Golds 09/14/2021, 9:50 AM      Kathleen Lime, MS [facility] SLP Acute Rehab Services Office [phone] Pager [phone]            DG Chest Port 1 View  Result Date: 09/13/2021 CLINICAL DATA:  Respiratory failure.  Follow-up study. EXAM: PORTABLE CHEST 1 VIEW COMPARISON:  09/11/2021 and older studies.  CT, 09/09/2021. FINDINGS: Bilateral airspace lung opacities are without significant change from the most recent prior study. No new lung abnormalities. No pleural effusion. No pneumothorax. Stable changes from a prior median sternotomy. IMPRESSION: 1. No significant change from the most recent prior study. 2. Persistent bilateral airspace lung opacities consistent with multifocal pneumonia. Electronically Signed   By: Lajean Manes M.D.   On: 09/13/2021 08:38   DG CHEST PORT 1 VIEW  Result Date: 09/11/2021 CLINICAL DATA:  Per order: acute respiratory distress with hypoxia. EXAM: PORTABLE CHEST 1 VIEW COMPARISON:  09/08/2021 and older exams.  CT, 09/09/2021. FINDINGS: Bilateral airspace lung opacities are without significant change from the most recent prior radiograph. No new lung abnormalities. No convincing pleural effusion and no pneumothorax. Stable changes from a previous median sternotomy. IMPRESSION: 1. No change from the prior chest radiograph. 2. Persistent bilateral airspace  lung opacities consistent with multifocal pneumonia. Electronically Signed   By: Lajean Manes M.D.   On: 09/11/2021 09:40   CT CHEST WO CONTRAST  Result Date: 09/09/2021 CLINICAL DATA:  Respiratory illness, nondiagnostic xray. E coli bacteremia. EXAM: CT CHEST WITHOUT CONTRAST TECHNIQUE: Multidetector CT imaging of the chest was performed following the standard protocol without IV contrast. RADIATION DOSE REDUCTION: This exam was performed according to the departmental dose-optimization program which includes automated exposure control, adjustment of the mA and/or kV according to patient size and/or use of iterative reconstruction technique. COMPARISON:  09/04/2021 FINDINGS: Cardiovascular: Coronary artery bypass grafting has been performed. Cardiac size is mildly enlarged, stable since prior examination. No pericardial effusion. Central pulmonary arteries are enlarged in keeping with changes of pulmonary arterial hypertension. Extensive atherosclerotic calcification is seen within the thoracic aorta. No aortic aneurysm. Particularly prominent atherosclerotic calcification is seen within the proximal arch vasculature, particular at the origin of the left common carotid and left subclavian arteries, though the degree of stenosis is not well assessed on this noncontrast examination. Mediastinum/Nodes: 2.8 cm right thyroid nodule is unchanged in size and is better assessed on thyroid sonogram of 12/20/2018. No pathologic thoracic adenopathy. The esophagus is unremarkable. Lungs/Pleura: Multifocal, patchy ground-glass and consolidative pulmonary infiltrates are again identified demonstrating a slight mid and upper lung zone predominance, likely multifocal pneumonia. These are progressive since prior examination. No pneumothorax or pleural effusion. No central obstructing lesion. Upper Abdomen: Status post cholecystectomy. No acute abnormality. Extensive atherosclerotic calcification within the visualized abdominal  aorta. Musculoskeletal: No acute bone abnormality. No lytic or blastic bone lesions are identified. IMPRESSION: 1. Progressive multifocal pulmonary  infiltrates most in keeping with inflammatory changes such as progressive multifocal pneumonia or ARDS. 2. Stable mild cardiomegaly. 3. Morphologic changes in keeping with pulmonary arterial hypertension. 4. Peripheral vascular disease with prominent atherosclerotic calcification at the origin of the left common carotid and left subclavian arteries, though the degree of stenosis is not well assessed on this noncontrast examination. If indicated, this could be better assessed with CT arteriography. 5. 2.8 cm right thyroid nodule, better assessed on thyroid sonogram of 12/20/2018. Aortic Atherosclerosis (ICD10-I70.0). Electronically Signed   By: Fidela Salisbury M.D.   On: 09/09/2021 17:48   DG CHEST PORT 1 VIEW  Result Date: 09/08/2021 CLINICAL DATA:  [id number]; shortness of breath EXAM: PORTABLE CHEST 1 VIEW COMPARISON:  Chest x-ray from yesterday FINDINGS: Stable sternotomy wires and the implanted loop recorder at the left lower chest. Cardiopericardial silhouette is mildly enlarged, stable. There are diffuse patchy opacities seen throughout the lungs without significant interval change. The visualized skeletal structures are unremarkable. IMPRESSION: There has been no significant interval change in the diffuse bilateral airspace opacities consistent with multifocal pneumonia. Electronically Signed   By: Frazier Richards M.D.   On: 09/08/2021 07:59   DG CHEST PORT 1 VIEW  Result Date: 09/07/2021 CLINICAL DATA:  Shortness of breath EXAM: PORTABLE CHEST - 1 VIEW COMPARISON:  08/29/2021 FINDINGS: Median sternotomy changes and implanted cardiac device again seen. Heart size is within normal limits. No pulmonary vascular congestion. Diffuse bilateral airspace opacities are unchanged from prior exam. IMPRESSION: Unchanged diffuse bilateral airspace opacities, most consistent  with multifocal pneumonia. Electronically Signed   By: Miachel Roux M.D.   On: 09/07/2021 08:24   DG CHEST PORT 1 VIEW  Result Date: 09/06/2021 CLINICAL DATA:  Follow-up pneumonia. EXAM: PORTABLE CHEST 1 VIEW COMPARISON:  September 05, 2021 FINDINGS: Multiple sternal wires and vascular clips are seen. The heart size and mediastinal contours are within normal limits. A radiopaque loop recorder device is seen. There is moderate severity calcification of the aortic arch. Stable, moderate severity bilateral multifocal infiltrates are seen. There is no evidence of a pleural effusion or pneumothorax. The visualized skeletal structures are unremarkable. IMPRESSION: 1. Evidence of prior median sternotomy/CABG. 2. Stable, moderate severity bilateral multifocal infiltrates. Electronically Signed   By: Virgina Norfolk M.D.   On: 09/06/2021 02:56   DG CHEST PORT 1 VIEW  Result Date: 09/05/2021 CLINICAL DATA:  Shortness of breath EXAM: PORTABLE CHEST 1 VIEW COMPARISON:  September 04, 2021 FINDINGS: Multifocal pneumonia remains, similar on the left and mildly worsened on the right. Infiltrate is now projected over the right base. The cardiomediastinal silhouette is stable. No pneumothorax. No other abnormalities. IMPRESSION: Mild worsening of multifocal pneumonia. Electronically Signed   By: Dorise Bullion III M.D.   On: 09/05/2021 09:35   DG CHEST PORT 1 VIEW  Result Date: 09/04/2021 CLINICAL DATA:  Shortness of breath EXAM: PORTABLE CHEST 1 VIEW COMPARISON:  CT chest dated 09/04/2021 FINDINGS: Multifocal patchy opacities, right upper lobe predominant, compatible with pneumonia. This is better evaluated on recent CT. Mild cardiomegaly. Median sternotomy. IMPRESSION: Multifocal pneumonia, right upper lobe predominant, better evaluated on recent CT. Electronically Signed   By: Julian Hy M.D.   On: 09/04/2021 21:48   CT CHEST WO CONTRAST  Result Date: 09/04/2021 CLINICAL DATA:  Pneumonia.  Suspected  complication. EXAM: CT CHEST WITHOUT CONTRAST TECHNIQUE: Multidetector CT imaging of the chest was performed following the standard protocol without IV contrast. RADIATION DOSE REDUCTION: This exam was performed according to the departmental  dose-optimization program which includes automated exposure control, adjustment of the mA and/or kV according to patient size and/or use of iterative reconstruction technique. COMPARISON:  Chest x-ray September 03, 2021 FINDINGS: Cardiovascular: Calcified atherosclerotic changes identified in the nonaneurysmal aorta. Three-vessel coronary artery disease identified. The heart size is borderline. Central pulmonary arteries are unremarkable. Mediastinum/Nodes: No pleural or pericardial effusions. Shotty nodes in the mediastinum are likely reactive without gross adenopathy. Possible enlargement of the right thyroid lobe containing a calcification, asymmetric to the left. No underlying nodule measuring up to 3.3 cm is not excluded. The esophagus is normal. Lungs/Pleura: Central airways are normal. No pneumothorax. Patchy bilateral pulmonary infiltrates involve all lobes, likely multifocal pneumonia. No abscess or complication identified. Upper Abdomen: No acute abnormality. Musculoskeletal: No chest wall mass or suspicious bone lesions identified. IMPRESSION: 1. Multifocal pneumonia without complication. Reactive nodes in the mediastinum. 2. Possible 3.3 cm incidental thyroid nodule. Recommend nonemergent thyroid US if clinically warranted given patient age. Reference: J Am Coll Radiol. 2015 Feb;12(2): 143-50 3. Calcified atherosclerotic change in the nonaneurysmal thoracic aorta. Coronary artery disease. Aortic Atherosclerosis (ICD10-I70.0). Electronically Signed   By: Dorise Bullion III M.D.   On: 09/04/2021 16:21   DG Chest Port 1 View  Result Date: 08/20/2021 CLINICAL DATA:  A female at age 63 presents for assessment of altered mental status/delirium. EXAM: PORTABLE CHEST 1  VIEW COMPARISON:  July 09, 2021 FINDINGS: EKG leads project over the chest. Cardiac loop recorder over the LEFT chest.  Post median sternotomy. Trachea midline. Cardiomediastinal contours remain enlarged. Fullness of RIGHT and LEFT hilum. Increasing opacities throughout the chest. Persistent opacity in the LEFT mid chest but with numerous opacities throughout the LEFT and RIGHT chest compatible with multifocal process. No pneumothorax. On limited assessment there is no acute skeletal finding. IMPRESSION: 1. Findings most suggestive of worsening of multifocal pneumonia. Continued worsening of multifocal airspace disease is noted on today's study. Nodular appearance is apparent. Close follow-up is suggested with correlation with any symptoms of infection. Given nodular appearance and continued progression, CT may be helpful for further evaluation. Electronically Signed   By: Zetta Bills M.D.   On: 09/07/2021 13:50   CT Head Wo Contrast  Result Date: 09/02/2021 CLINICAL DATA:  Mental status changes.  Unknown cause.  Delirium. EXAM: CT HEAD WITHOUT CONTRAST TECHNIQUE: Contiguous axial images were obtained from the base of the skull through the vertex without intravenous contrast. RADIATION DOSE REDUCTION: This exam was performed according to the departmental dose-optimization program which includes automated exposure control, adjustment of the mA and/or kV according to patient size and/or use of iterative reconstruction technique. COMPARISON:  CT head without and contrast 08/23/2018 FINDINGS: Brain: No acute infarct, hemorrhage, or mass lesion is present. Moderate atrophy and white matter changes are stable. A remote lacunar infarct in the posterior right cerebellum is new from the prior exam but does not appear acute. The ventricles are of normal size. No significant extraaxial fluid collection is present. Vascular: Atherosclerotic calcifications are present within the cavernous internal carotid arteries at the  dural margin of both vertebral arteries. No hyperdense vessel is present. Skull: Calvarium is intact. No focal lytic or blastic lesions are present. No significant extracranial soft tissue lesion is present. Sinuses/Orbits: The paranasal sinuses and mastoid air cells are clear. Bilateral lens replacements are noted. Globes and orbits are otherwise unremarkable. IMPRESSION: 1. No acute intracranial abnormality or significant interval change. 2. Stable atrophy and white matter disease. This likely reflects the sequela of chronic microvascular  ischemia. 3. 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 Electronically Signed   By: San Morelle M.D.   On: 09/07/2021 13:47    Microbiology: Recent Results (from the past 240 hour(s))  MRSA Next Gen by PCR, Nasal     Status: None   Collection Time: 09/09/21 10:54 PM   Specimen: Nasal Mucosa; Nasal Swab  Result Value Ref Range Status   MRSA by PCR Next Gen NOT DETECTED NOT DETECTED Final    Comment: (NOTE) The GeneXpert MRSA Assay (FDA approved for NASAL specimens only), is one component of a comprehensive MRSA colonization surveillance program. It is not intended to diagnose MRSA infection nor to guide or monitor treatment for MRSA infections. Test performance is not FDA approved in patients less than 6 years old. Performed at [facility], [place] [address] [place], [place] [address]   Respiratory (~20 pathogens) panel by PCR     Status: None   Collection Time: 09/09/21 10:54 PM   Specimen: Nasopharyngeal Swab; Respiratory  Result Value Ref Range Status   Adenovirus NOT DETECTED NOT DETECTED Final   Coronavirus 229E NOT DETECTED NOT DETECTED Final    Comment: (NOTE) The Coronavirus on the Respiratory Panel, DOES NOT test for the novel  Coronavirus (2019 nCoV)    Coronavirus HKU1 NOT DETECTED NOT DETECTED Final   Coronavirus NL63 NOT DETECTED NOT DETECTED  Final   Coronavirus OC43 NOT DETECTED NOT DETECTED Final   Metapneumovirus NOT DETECTED NOT DETECTED Final   Rhinovirus / Enterovirus NOT DETECTED NOT DETECTED Final   Influenza A NOT DETECTED NOT DETECTED Final   Influenza B NOT DETECTED NOT DETECTED Final   Parainfluenza Virus 1 NOT DETECTED NOT DETECTED Final   Parainfluenza Virus 2 NOT DETECTED NOT DETECTED Final   Parainfluenza Virus 3 NOT DETECTED NOT DETECTED Final   Parainfluenza Virus 4 NOT DETECTED NOT DETECTED Final   Respiratory Syncytial Virus NOT DETECTED NOT DETECTED Final   Bordetella pertussis NOT DETECTED NOT DETECTED Final   Bordetella Parapertussis NOT DETECTED NOT DETECTED Final   Chlamydophila pneumoniae NOT DETECTED NOT DETECTED Final   Mycoplasma pneumoniae NOT DETECTED NOT DETECTED Final    Comment: Performed at [facility], [place]. [address] [place], [place] [address]     Labs: Basic Metabolic Panel: Recent Labs  Lab 09/11/21 0259 09/12/21 0331 09/13/21 0250 09/13/21 1334 09/14/21 0304 09/15/21 2108  NA 146* 143 143 145 146*  --   K 3.8 3.8 3.7 3.5 3.5  --   CL 109 108 109 107 112*  --   CO2 '26 28 26 26 26  '$ --   GLUCOSE 234* 217* 301* 324* 170*  --   BUN 62* 73* 90* 89* 99*  --   CREATININE 1.79* 1.90* 2.12* 2.43* 2.56*  --   CALCIUM 8.5* 8.2* 8.3* 8.8* 8.4*  --   MG  --   --   --   --   --  2.8*  PHOS  --   --   --   --   --  4.0   Liver Function Tests: No results for input(s): "AST", "ALT", "ALKPHOS", "BILITOT", "PROT", "ALBUMIN" in the last 168 hours. No results for input(s): "LIPASE", "AMYLASE" in the last 168 hours. No results for input(s): "AMMONIA" in the last 168 hours. CBC: Recent Labs  Lab 09/11/21 0259 09/14/21 0304  WBC 18.1* 21.4*  HGB 7.4* 7.4*  HCT 25.6* 25.1*  MCV 88.9 85.7  PLT 359 329   Cardiac Enzymes: No results for input(s): "CKTOTAL", "CKMB", "CKMBINDEX", "TROPONINI" in the last 168 hours. D-Dimer No results for input(s): "DDIMER" in the last 72  hours. BNP: Invalid input(s): "POCBNP" CBG: Recent Labs  Lab 09/15/21 2036 09/15/21 2340 03-Oct-2021 0353 Oct 03, 2021 0745 2021-10-03 1231  GLUCAP 245* 254* 310* 271* 312*   Anemia work up No results for input(s): "VITAMINB12", "FOLATE", "FERRITIN", "TIBC", "IRON", "RETICCTPCT" in the last 72 hours. Urinalysis    Component Value Date/Time   COLORURINE YELLOW 09/02/2021 1636   APPEARANCEUR CLEAR 08/22/2021 1636   LABSPEC 1.012 08/20/2021 1636   PHURINE 5.0 08/26/2021 1636   GLUCOSEU >1,000 (A) 08/26/2021 1636   HGBUR NEGATIVE 08/19/2021 1636   BILIRUBINUR NEGATIVE 08/30/2021 1636   KETONESUR NEGATIVE 09/04/2021 1636   PROTEINUR 30 (A) 08/17/2021 1636   UROBILINOGEN 1.0 09/18/2019 1903   NITRITE NEGATIVE 09/02/2021 1636   LEUKOCYTESUR NEGATIVE 08/15/2021 1636   Sepsis Labs Recent Labs  Lab 09/11/21 0259 09/14/21 0304  WBC 18.1* 21.4*       SIGNED:  Aline August, MD  [facility] 09/17/2021, 12:44 PM

## 2021-10-10 NOTE — Progress Notes (Signed)
PROGRESS NOTE    Linda Oliver  WUJ:[id number] DOB: April 09, 1932 DOA: 08/11/2021 PCP: Shon Baton, MD   Brief Narrative:  88 year old female with CKD stage IIIb, atrial fibrillation, coronary artery disease status post stenting and CABG, diabetes mellitus type 2, OSA, obesity, recent hospitalization from 07/06/2021-07/13/2021 for acute CHF/lower pneumonia and non-STEMI for which underwent cardiac catheterization which showed severe native vessel disease presented to the hospital for hallucinations and weakness.  On presentation, she was found to have multifocal pneumonia and started on Rocephin and Zithromax.  She was subsequently found to have E. coli bacteremia.  On 09/09/2021, she was found to be in respiratory distress: Transferred to stepdown; given IV Lasix.  CT showed increasing pulmonary infiltrates.  PCCM consulted.  Patient required BiPAP and high flow nasal cannula oxygen.  Antibiotics changed to cefepime and Solu-Medrol dose increased by PCCM.  Assessment & Plan:   Acute on chronic hypoxic respiratory failure Multifocal infiltrates:?  Bacterial pneumonia/amiodarone toxicity/recurrent aspirations Acute on chronic systolic CHF History of OSA -She was discharged home with 2 L oxygen on 07/13/2021 -During the hospitalization, she has required increased amounts of oxygen including BiPAP and high flow nasal cannula.  PCCM following and tapering IV steroids.  Currently on 8 L oxygen via nasal cannula.  Respiratory status has worsened compared to 09/15/2021 when she was on a 3 L oxygen. -Has completed 10-day course of cefepime and 5-day course of Zithromax -EF 35%.  Patient was treated with diuretics but currently diuretics on hold.  Continue metoprolol and isosorbide dinitrate.  Strict input and output and daily weights. -Oral intake poor.  SLP following.  Diet as per SLP recommendations.  Acute metabolic encephalopathy History of dementia Goals of care Physical deconditioning -Apparently mental  status has gotten worse over the last 2 days.  CT of the head without contrast repeated on 9/23 did not show any acute intracranial abnormality.  Monitor mental status.  DC'd gabapentin.  Continue with memantine and donepezil. -NG tube placed on 09/15/2021 and feeding has been started.  Tube feeding as per dietitian recommendations. -Palliative care consultation for goals of care discussion.   -If condition does not improve, recommend hospice/total comfort measures. Family leaning towards total comfort measures; but not fully decided yet. Will hold off on any more blood work: son in Higher education careers adviser.  E. coli bacteremia -Has completed course of cefepime.  Paroxysmal A-fib -Rate controlled.  Continue metoprolol and Eliquis.  Discharged with amiodarone on 7/23: Currently not on amiodarone.  Has a loop recorder  AKI on CKD stage IIIb -Creatinine 1.3-1.5 at baseline.  Diuretics currently on hold.  Creatinine 2.56 on 09/14/2021.  Labs pending for today.  Monitor.  CAD -Currently on apixaban, Crestor, Zetia, metoprolol succinate and isosorbide dinitrate.  Plavix has been discontinued by cardiology.  Follow cardiology recommendations  Normocytic anemia/anemia of chronic disease -From chronic basis.  Hemoglobin 7.4 on 09/14/2021.    Leukocytosis -monitor  History of bilateral carotid artery stenosis -Surveillance stopped due to age  Hard of hearing -Cannot hear well without hearing aid   DVT prophylaxis: Eliquis Code Status: DNR  Family Communication: son-in-law at bedside Disposition Plan: Status is: Inpatient Remains inpatient appropriate because: Of severity of illness  Consultants: PCCM/cardiology.  palliative care  Procedures: None  Antimicrobials:  Anti-infectives (From admission, onward)    Start     Dose/Rate Route Frequency Ordered Stop   09/13/21 1800  ceFEPIme (MAXIPIME) 2 g in sodium chloride 0.9 % 100 mL IVPB  Note to Pharmacy: Last dose today   2 g 200 mL/hr over 30  Minutes Intravenous Daily-1800 09/13/21 1201 09/13/21 1804   09/09/21 2000  ceFEPIme (MAXIPIME) 2 g in sodium chloride 0.9 % 100 mL IVPB  Status:  Discontinued        2 g 200 mL/hr over 30 Minutes Intravenous Daily-1800 09/09/21 1919 09/13/21 1201   09/04/21 1400  azithromycin (ZITHROMAX) 500 mg in sodium chloride 0.9 % 250 mL IVPB  Status:  Discontinued        500 mg 250 mL/hr over 60 Minutes Intravenous Every 24 hours 09/04/21 1126 09/04/21 1129   09/04/21 1400  cefTRIAXone (ROCEPHIN) 2 g in sodium chloride 0.9 % 100 mL IVPB  Status:  Discontinued        2 g 200 mL/hr over 30 Minutes Intravenous Every 24 hours 09/04/21 1128 09/09/21 1836   09/04/21 1215  azithromycin (ZITHROMAX) tablet 500 mg        500 mg Oral Daily 09/04/21 1129 09/07/21 0925   08/27/2021 1445  cefTRIAXone (ROCEPHIN) 2 g in sodium chloride 0.9 % 100 mL IVPB  Status:  Discontinued        2 g 200 mL/hr over 30 Minutes Intravenous Every 24 hours 08/16/2021 1440 09/04/21 1128   08/16/2021 1445  azithromycin (ZITHROMAX) 500 mg in sodium chloride 0.9 % 250 mL IVPB  Status:  Discontinued        500 mg 250 mL/hr over 60 Minutes Intravenous Every 24 hours 08/18/2021 1440 09/04/21 1126        Subjective: Patient seen and examined at bedside.  Poor historian.  Still very slow to respond.  No agitation, fever, seizures reported. Objective: Vitals:   2021/10/13 0100 2021-10-13 0200 2021-10-13 0300 10-13-21 0400  BP:    (!) 184/37  Pulse: 88 97 99 99  Resp: (!) '21 16 19 '$ (!) 26  Temp:    98.1 F (36.7 C)  TempSrc:    Axillary  SpO2: 95% 97% 96% 96%  Weight:      Height:        Intake/Output Summary (Last 24 hours) at October 13, 2021 0747 Last data filed at 2021-10-13 0505 Gross per 24 hour  Intake 679.17 ml  Output 860 ml  Net -180.83 ml    Filed Weights   09/13/21 0500 09/14/21 0500 09/15/21 0504  Weight: 68.5 kg 71.8 kg 56.9 kg    Examination:  General: On 8 L oxygen via nasal cannula.  No distress.  Chronically ill in the  condition. ENT/neck: No thyromegaly.  JVD is not elevated  respiratory: Decreased breath sounds at bases bilaterally with some crackles; mildly tachypneic intermittently  CVS: S1-S2 heard, rate controlled Abdominal: Soft, nontender, slightly distended; no organomegaly, normal bowel sounds are heard Extremities: Trace lower extremity edema; no cyanosis  CNS: Sleepy, wakes up slightly; poor historian; hardly participates in any conversation. No focal neurologic deficit.  Moves extremities Lymph: No obvious lymphadenopathy Skin: No obvious ecchymosis/lesions  psych: Currently not agitated.  Flat affect.   Musculoskeletal: No obvious joint swelling/deformity    Data Reviewed: I have personally reviewed following labs and imaging studies  CBC: Recent Labs  Lab 09/09/21 0806 09/10/21 0751 09/11/21 0259 09/14/21 0304  WBC 18.8* 14.2* 18.1* 21.4*  HGB 7.9* 7.8* 7.4* 7.4*  HCT 26.6* 26.1* 25.6* 25.1*  MCV 87.2 85.6 88.9 85.7  PLT 321 319 359 062    Basic Metabolic Panel: Recent Labs  Lab 09/11/21 0259 09/12/21 0331 09/13/21 0250 09/13/21 1334 09/14/21 0304  09/15/21 2108  NA 146* 143 143 145 146*  --   K 3.8 3.8 3.7 3.5 3.5  --   CL 109 108 109 107 112*  --   CO2 '26 28 26 26 26  '$ --   GLUCOSE 234* 217* 301* 324* 170*  --   BUN 62* 73* 90* 89* 99*  --   CREATININE 1.79* 1.90* 2.12* 2.43* 2.56*  --   CALCIUM 8.5* 8.2* 8.3* 8.8* 8.4*  --   MG  --   --   --   --   --  2.8*  PHOS  --   --   --   --   --  4.0    GFR: Estimated Creatinine Clearance: 12.2 mL/min (A) (by C-G formula based on SCr of 2.56 mg/dL (H)). Liver Function Tests: No results for input(s): "AST", "ALT", "ALKPHOS", "BILITOT", "PROT", "ALBUMIN" in the last 168 hours. No results for input(s): "LIPASE", "AMYLASE" in the last 168 hours. No results for input(s): "AMMONIA" in the last 168 hours. Coagulation Profile: No results for input(s): "INR", "PROTIME" in the last 168 hours. Cardiac Enzymes: No results for  input(s): "CKTOTAL", "CKMB", "CKMBINDEX", "TROPONINI" in the last 168 hours. BNP (last 3 results) No results for input(s): "PROBNP" in the last 8760 hours. HbA1C: No results for input(s): "HGBA1C" in the last 72 hours. CBG: Recent Labs  Lab 09/15/21 1615 09/15/21 2036 09/15/21 2340 10-07-2021 0353 10/07/21 0745  GLUCAP 168* 245* 254* 310* 271*    Lipid Profile: No results for input(s): "CHOL", "HDL", "LDLCALC", "TRIG", "CHOLHDL", "LDLDIRECT" in the last 72 hours. Thyroid Function Tests: No results for input(s): "TSH", "T4TOTAL", "FREET4", "T3FREE", "THYROIDAB" in the last 72 hours. Anemia Panel: No results for input(s): "VITAMINB12", "FOLATE", "FERRITIN", "TIBC", "IRON", "RETICCTPCT" in the last 72 hours. Sepsis Labs: No results for input(s): "PROCALCITON", "LATICACIDVEN" in the last 168 hours.  Recent Results (from the past 240 hour(s))  MRSA Next Gen by PCR, Nasal     Status: None   Collection Time: 09/09/21 10:54 PM   Specimen: Nasal Mucosa; Nasal Swab  Result Value Ref Range Status   MRSA by PCR Next Gen NOT DETECTED NOT DETECTED Final    Comment: (NOTE) The GeneXpert MRSA Assay (FDA approved for NASAL specimens only), is one component of a comprehensive MRSA colonization surveillance program. It is not intended to diagnose MRSA infection nor to guide or monitor treatment for MRSA infections. Test performance is not FDA approved in patients less than 51 years old. Performed at [facility], [place] [address] [place], [place] [address]   Respiratory (~20 pathogens) panel by PCR     Status: None   Collection Time: 09/09/21 10:54 PM   Specimen: Nasopharyngeal Swab; Respiratory  Result Value Ref Range Status   Adenovirus NOT DETECTED NOT DETECTED Final   Coronavirus 229E NOT DETECTED NOT DETECTED Final    Comment: (NOTE) The Coronavirus on the Respiratory Panel, DOES NOT test for the novel  Coronavirus (2019 nCoV)    Coronavirus HKU1 NOT DETECTED NOT  DETECTED Final   Coronavirus NL63 NOT DETECTED NOT DETECTED Final   Coronavirus OC43 NOT DETECTED NOT DETECTED Final   Metapneumovirus NOT DETECTED NOT DETECTED Final   Rhinovirus / Enterovirus NOT DETECTED NOT DETECTED Final   Influenza A NOT DETECTED NOT DETECTED Final   Influenza B NOT DETECTED NOT DETECTED Final   Parainfluenza Virus 1 NOT DETECTED NOT DETECTED Final   Parainfluenza Virus 2 NOT DETECTED NOT DETECTED Final   Parainfluenza  Virus 3 NOT DETECTED NOT DETECTED Final   Parainfluenza Virus 4 NOT DETECTED NOT DETECTED Final   Respiratory Syncytial Virus NOT DETECTED NOT DETECTED Final   Bordetella pertussis NOT DETECTED NOT DETECTED Final   Bordetella Parapertussis NOT DETECTED NOT DETECTED Final   Chlamydophila pneumoniae NOT DETECTED NOT DETECTED Final   Mycoplasma pneumoniae NOT DETECTED NOT DETECTED Final    Comment: Performed at [facility], [place] [address], [place], [place] [address]         Radiology Studies: CT HEAD WO CONTRAST (5MM)  Result Date: 09/15/2021 CLINICAL DATA:  Mental status changes of unknown cause, history atrial fibrillation, hypertension, type II diabetes mellitus EXAM: CT HEAD WITHOUT CONTRAST TECHNIQUE: Contiguous axial images were obtained from the base of the skull through the vertex without intravenous contrast. RADIATION DOSE REDUCTION: This exam was performed according to the departmental dose-optimization program which includes automated exposure control, adjustment of the mA and/or kV according to patient size and/or use of iterative reconstruction technique. COMPARISON:  09/07/2021 FINDINGS: Brain: Generalized atrophy. Normal ventricular morphology. No midline shift or mass effect. Small vessel chronic ischemic changes of deep cerebral white matter. No intracranial hemorrhage, mass lesion, evidence of acute infarction, or extra-axial fluid collection. Small dural calcification RIGHT tentorium unchanged. Vascular: No hyperdense vessels.  Atherosclerotic calcification of internal carotid and vertebral arteries at skull base Skull: Intact Sinuses/Orbits: Clear Other: N/A IMPRESSION: Atrophy with small vessel chronic ischemic changes of deep cerebral white matter. No acute intracranial abnormalities. Electronically Signed   By: Lavonia Dana M.D.   On: 09/15/2021 12:51   DG Abd Portable 1V  Result Date: 09/15/2021 CLINICAL DATA:  Feeding tube placement EXAM: PORTABLE ABDOMEN - 1 VIEW COMPARISON:  09/25/2019 FINDINGS: Bilateral patchy interstitial and alveolar airspace opacities concerning for multilobar pneumonia. Feeding tube with the tip projecting over the stomach. No bowel dilatation to suggest obstruction. No evidence of pneumoperitoneum, portal venous gas or pneumatosis. No pathologic calcifications along the expected course of the ureters. No acute osseous abnormality. IMPRESSION: 1. Feeding tube with the tip projecting over the stomach. 2. Bilateral patchy interstitial and alveolar airspace opacities concerning for multilobar pneumonia. Electronically Signed   By: Kathreen Devoid M.D.   On: 09/15/2021 11:22   DG Swallowing Func-Speech Pathology  Result Date: 09/14/2021 Table formatting from the original result was not included. Objective Swallowing Evaluation: Type of Study: MBS-Modified Barium Swallow Study  Patient Details Name: ANTANISHA MOHS MRN: [id number] Date of Birth: [birth year] Today's Date: 09/14/2021 Time: SLP Start Time (ACUTE ONLY): 0840 -SLP Stop Time (ACUTE ONLY): 5329 SLP Time Calculation (min) (ACUTE ONLY): 10 min Past Medical History: Past Medical History: Diagnosis Date  Anginal pain ([place])   occ  Anxiety   Broken ankle   Bullous pemphigus   CAD (coronary artery disease)   [place]   80% stenosis ostial Left main, 30% stenosis proximal LAD, 80% stenosis proximal RCA CABG  -  LIMA to LAD, SVG to int, SVG to [place], [place]   Depression   Diabetic peripheral neuropathy ([place])   Encounter for loop recorder check  04/11/2019  GERD (gastroesophageal reflux disease)   H/O: stroke: Left occipital stroke and vision loss 08/24/2018  Headache(784.0)   hx migraines  Heart murmur   mvp  Hyperlipidemia   Hypertension   Hypertensive heart disease   Loop recorder Reveal 08/24/2018 04/11/2019  Scheduled Remote loop recorder check 03/10/2019: The presenting rhythm is sinus bradycardia. There were 0 symptomatic patient activations  recorded. There were 275 AF episodes and 0 AT episodes recorded, representing a 4.2 % cumulative AT/AF burden. The longest episode lasted 00:03:40:00 in duration. Some false with PAC. There were 0 tachy episodes detected. There were 0 pause episodes detected. Th  Nasal bleeding   occ due to O2  OA (osteoarthritis)   Obesity (BMI 30-39.9)   Obstructive sleep apnea on CPAP   Osteopenia   Paroxysmal atrial fibrillation ([place]) 04/17/2019  Peripheral neuropathy   Seizure ([place])   2009  Sleep apnea   dx 62yr ago doesnt use cpap but uses O2 at nite 2L  Stones in the urinary tract   Type 2 diabetes mellitus with vascular disease ([place]   UTI (lower urinary tract infection)  Past Surgical History: Past Surgical History: Procedure Laterality Date  ABDOMINAL HYSTERECTOMY  70's  CARDIAC CATHETERIZATION    CARDIOVERSION N/A 07/08/2021  Procedure: CARDIOVERSION;  Surgeon: GAdrian Prows MD;  [facility]  Service: Cardiovascular;  Laterality: N/A;  CARPAL TUNNEL RELEASE Bilateral   CATARACT EXTRACTION W/PHACO Bilateral   CHOLECYSTECTOMY    CORONARY ANGIOPLASTY WITH STENT PLACEMENT  2023  CORONARY ARTERY BYPASS GRAFT  1996  EYE SURGERY Left   LOOP RECORDER INSERTION N/A 08/24/2018  Procedure: LOOP RECORDER INSERTION;  Surgeon: CConstance Haw MD;  [facility];  Service: Cardiovascular;  Laterality: N/A;  RIGHT/LEFT HEART CATH AND CORONARY/GRAFT ANGIOGRAPHY N/A 07/12/2021  Procedure: RIGHT/LEFT HEART CATH AND CORONARY/GRAFT ANGIOGRAPHY;  Surgeon: PNigel Mormon MD;  [facility];  Service:  Cardiovascular;  Laterality: N/A;  SHOULDER ARTHROSCOPY    rt  SHOULDER ARTHROSCOPY WITH ROTATOR CUFF REPAIR AND SUBACROMIAL DECOMPRESSION  12/01/2011  Procedure: SHOULDER ARTHROSCOPY WITH ROTATOR CUFF REPAIR AND SUBACROMIAL DECOMPRESSION;  Surgeon: JNita Sells MD;  [facility]  Service: Orthopedics;  Laterality: Left;  shoulder debridement calcific tendonitis HPI: This is an 88year old female who presented to the hospital for hallucinations and weakness.  Per family delirium had been increasing for about a week. Her PCP had felt she may have a UTI and she was started on cephalexin the day before coming to the hospital.  She was initially admitted for multifocal pneumonia and started on ceftriaxone and azithromycin.  She was later found to have E. coli bacteremia. Pulmonology consulted ordered SLP b/c aspiration part of differential dx. Pt has h/o CHF and CVA.  BSE completed - and MBS advised.  Pt was demonstrating throat clearing with thin po intake on Sun, Sept 3rd and dys3/nectar diet initiated.  Subjective: pt awake in flouro chair, RN present  Recommendations for follow up therapy are one component of a multi-disciplinary discharge planning process, led by the attending physician.  Recommendations may be updated based on patient status, additional functional criteria and insurance authorization. Assessment / Plan / Recommendation   09/14/2021   9:31 AM Clinical Impressions Clinical Impression Abbreviated test with SLP being able to observe nectar via tsp and cup only showing moderately severe oropharyngeal dysphagia.  Initially pt fully alert, able to verbalize her name and willing to participate in [place]  Only two boluses were swallowed.  Severely limited labial closure noted with pt allowing 90% of boluses to spill anteriorally without awareness.  SLP then administered bolus via cup - essentially dumping a small portion into anterior oral cavity.  A small portion of nectar transited into  pharynx with swallow triggering at vallecular space with trace laryngeal penetration due to impaired laryngeal closure.  Pt did not follow directions to  cough, clear her throat or re-swallow.  She then began retching ongoing for approximately 3 minutes after 2nd barium swallow.  RN then provided suction to anterior oral cavity.  SLP resumed testing, and pt noted to have sudden AMS during administration attempts.   She did not stay alert despite gentle sternal rubbing and consistent verbal stimulation. Testing was then aborted for pt safety and SLP assisted to get pt back to her ICU room.   SLP left pt in her ICU room with MD, family member and nurses.  Recommend MBS when pt medically stable to participate, will continue efforts.  Shared results of testing with pt family member, MD and RNs. SLP Visit Diagnosis Dysphagia, oropharyngeal phase (R13.12) Impact on safety and function Severe aspiration risk;Risk for inadequate nutrition/hydration     09/14/2021   9:31 AM Treatment Recommendations Treatment Recommendations Therapy as outlined in treatment plan below     09/14/2021   9:46 AM Prognosis Prognosis for Safe Diet Advancement Guarded Barriers to Reach Goals Severity of deficits   09/14/2021   9:31 AM Diet Recommendations SLP Diet Recommendations Nectar Liquids Liquid Administration via Cup; NO STRAW Medication Administration Via alternative means or with applesauce Compensations Slow rate;Small sips/bites Postural Changes Remain semi-upright after after feeds/meals (Comment);Seated upright at 90 degrees; oral suction if pt does not swallow     09/14/2021   9:31 AM Other Recommendations Oral Care Recommendations Oral care BID Follow Up Recommendations Skilled nursing-short term rehab (<3 hours/day) Assistance recommended at discharge Frequent or constant Supervision/Assistance Functional Status Assessment Patient has had a recent decline in their functional status and demonstrates the ability to make significant  improvements in function in a reasonable and predictable amount of time.   09/14/2021   9:31 AM Frequency and Duration  Speech Therapy Frequency (ACUTE ONLY) min 2x/week Treatment Duration 2 weeks     09/14/2021   9:24 AM Oral Phase Oral Phase Impaired Oral - Nectar Teaspoon Weak lingual manipulation;Delayed oral transit;Left anterior bolus loss;Right anterior bolus loss;Lingual/palatal residue;Lingual pumping;Premature spillage Oral - Nectar Cup Lingual/palatal residue;Weak lingual manipulation;Left anterior bolus loss;Right anterior bolus loss;Premature spillage;Delayed oral transit Oral - Nectar Straw Weak lingual manipulation    09/14/2021   9:26 AM Pharyngeal Phase Pharyngeal Phase Impaired Pharyngeal- Nectar Teaspoon Delayed swallow initiation-vallecula;Reduced laryngeal elevation;Reduced airway/laryngeal closure;Penetration/Aspiration during swallow Pharyngeal Material enters airway, remains ABOVE vocal cords and not ejected out Pharyngeal- Nectar Cup Delayed swallow initiation-vallecula;Reduced laryngeal elevation;Reduced airway/laryngeal closure Pharyngeal Material enters airway, remains ABOVE vocal cords and not ejected out Pharyngeal- Nectar Straw NT Pharyngeal Comment Pt began wretching after 2nd bolus of nectar - minimal oral suction provided by RN to anterior oral cavity - within 2 minutes pt developed sudden AMS - closing her eyes, not staying alert, nor following directions despite deep sternal rubbing. Test was then aborted for pt safety and SLP assisted to transfer pt back to her room.    09/14/2021   9:31 AM Cervical Esophageal Phase  Cervical Esophageal Phase Darryll Capers Macario Golds 09/14/2021, 9:50 AM      Kathleen Lime, MS [facility] SLP Acute Rehab Services Office [phone] Pager [phone]                 Scheduled Meds:  apixaban  2.5 mg Per Tube BID   Chlorhexidine Gluconate Cloth  6 each Topical Daily   memantine  28 mg Oral QHS   And   donepezil  10 mg Per Tube QHS   DULoxetine  60 mg Oral  Daily  ezetimibe  10 mg Per Tube Daily   folic acid  1 mg Oral Daily   insulin aspart  0-15 Units Subcutaneous TID WC   insulin glargine-yfgn  30 Units Subcutaneous Daily   isosorbide dinitrate  30 mg Per Tube BID   linagliptin  5 mg Per Tube Daily   [START ON 09/17/2021] methylPREDNISolone (SOLU-MEDROL) injection  40 mg Intravenous Daily   metoprolol succinate  12.5 mg Oral Daily   mirabegron ER  25 mg Oral Daily   multivitamin  15 mL Per Tube Daily   mouth rinse  15 mL Mouth Rinse 4 times per day   pantoprazole  40 mg Per Tube Daily   rosuvastatin  10 mg Per Tube QAC supper   sodium chloride flush  3 mL Intravenous Q12H   Continuous Infusions:  sodium chloride Stopped (09/14/21 0823)   feeding supplement (OSMOLITE 1.5 CAL) 30 mL/hr at Sep 24, 2021 0430          Aline August, MD [facility] Sep 24, 2021, 7:47 AM

## 2021-10-10 NOTE — Progress Notes (Signed)
Chaplain offered prayer over [place] with her family.  Chaplain offered presence and support.    2021-09-29 1500  Clinical Encounter Type  Visited With Patient and family together  Visit Type Follow-up;Spiritual support  Spiritual Encounters  Spiritual Needs Prayer

## 2021-10-10 DEATH — deceased

## 2021-11-02 ENCOUNTER — Encounter (INDEPENDENT_AMBULATORY_CARE_PROVIDER_SITE_OTHER): Payer: Medicare Other | Admitting: Ophthalmology

## 2022-03-17 ENCOUNTER — Encounter (INDEPENDENT_AMBULATORY_CARE_PROVIDER_SITE_OTHER): Payer: Medicare Other | Admitting: Ophthalmology

## 2022-06-09 ENCOUNTER — Ambulatory Visit: Payer: Medicare Other | Admitting: Adult Health
